# Patient Record
Sex: Male | Born: 1950
Health system: Southern US, Community
[De-identification: ages and names within clinical notes are randomized; demographics above are authoritative.]

## PROBLEM LIST (undated history)

## (undated) DIAGNOSIS — E119 Type 2 diabetes mellitus without complications: Secondary | ICD-10-CM

## (undated) DIAGNOSIS — I10 Essential (primary) hypertension: Secondary | ICD-10-CM

## (undated) DIAGNOSIS — H269 Unspecified cataract: Secondary | ICD-10-CM

## (undated) DIAGNOSIS — E11319 Type 2 diabetes mellitus with unspecified diabetic retinopathy without macular edema: Secondary | ICD-10-CM

## (undated) DIAGNOSIS — H35039 Hypertensive retinopathy, unspecified eye: Secondary | ICD-10-CM

## (undated) DIAGNOSIS — I6381 Other cerebral infarction due to occlusion or stenosis of small artery: Secondary | ICD-10-CM

## (undated) HISTORY — PX: FINGER SURGERY: SHX640

## (undated) HISTORY — PX: BACK SURGERY: SHX140

## (undated) HISTORY — DX: Hypertensive retinopathy, unspecified eye: H35.039

## (undated) HISTORY — DX: Type 2 diabetes mellitus with unspecified diabetic retinopathy without macular edema: E11.319

## (undated) HISTORY — DX: Unspecified cataract: H26.9

## (undated) NOTE — *Deleted (*Deleted)
[facility] - Clinic Note  06/30/2020     CHIEF COMPLAINT Patient presents for No chief complaint on file.   HISTORY OF PRESENT ILLNESS: Charles Fox is a 37 y.o. male who presents to the clinic today for:   pt is here for PRP OD  Referring physician: Sandre Kitty, MD [address] [place],  [place] [address]  HISTORICAL INFORMATION:   Selected notes from the MEDICAL RECORD NUMBER Referred by Dr. Laruth Bouchard   CURRENT MEDICATIONS: Current Outpatient Medications (Ophthalmic Drugs)  Medication Sig  . prednisoLONE acetate (PRED FORTE) 1 % ophthalmic suspension Place 1 drop into the right eye 4 (four) times daily for 7 days.   No current facility-administered medications for this visit. (Ophthalmic Drugs)   Current Outpatient Medications (Other)  Medication Sig  . Alcohol Swabs PADS He is to check blood glucose 2-3 times a day  . aspirin 81 MG tablet Take 1 tablet (81 mg total) by mouth daily. (Patient not taking: Reported on 06/11/2020)  . atorvastatin (LIPITOR) 40 MG tablet Take 1 tablet (40 mg total) by mouth daily. (Patient not taking: Reported on 06/11/2020)  . blood glucose meter kit and supplies KIT Dispense based on patient and insurance preference. Use up to four times daily as directed. (FOR ICD-9 250.00, 250.01). (Patient not taking: Reported on 03/04/2020)  . Blood Glucose Monitoring Suppl (ONETOUCH VERIO) w/Device KIT by Does not apply route.  [name] glucose blood (ONETOUCH VERIO) test strip Please use to check blood glucose once in the AM while fasting prior to giving insulin.  [name] insulin degludec (TRESIBA FLEXTOUCH) 100 UNIT/ML FlexTouch Pen Inject 30 Units into the skin daily.  . Insulin Syringe-Needle U-100 (INSULIN SYRINGE .5CC/30GX1/2") 30G X 1/2" 0.5 ML MISC Use with glargine. Dispose after each use.  . losartan (COZAAR) 50 MG tablet Take 1 tablet (50 mg total) by mouth at bedtime. (Patient not taking: Reported on 06/11/2020)  . metFORMIN  (GLUCOPHAGE-XR) 500 MG 24 hr tablet Take 1 tablet (500 mg total) by mouth 2 (two) times daily with a meal.  . OneTouch Delica Lancets 33G MISC Please use to check blood glucose once in the AM while fasting prior to giving insulin.   No current facility-administered medications for this visit. (Other)      REVIEW OF SYSTEMS:    ALLERGIES No Known Allergies  PAST MEDICAL HISTORY Past Medical History:  Diagnosis Date  . Diabetes mellitus without complication (HCC)   . Hypertension    Past Surgical History:  Procedure Laterality Date  . BACK SURGERY    . FINGER SURGERY      FAMILY HISTORY No family history on file.  SOCIAL HISTORY Social History   Tobacco Use  . Smoking status: Current Every Day Smoker    Packs/day: 0.50    Types: Cigarettes  . Smokeless tobacco: Never Used  Vaping Use  . Vaping Use: Never used  Substance Use Topics  . Alcohol use: No  . Drug use: Not Currently         OPHTHALMIC EXAM:  Not recorded     IMAGING AND PROCEDURES  Imaging and Procedures for 06/30/2020           ASSESSMENT/PLAN:  No diagnosis found.  1,2. Proliferative diabetic retinopathy w/ DME, OU (OS > OD) - exam shows +NVI OU, VH OU, +NVD OU, +NVE OU, scattered MA/IRH OU - FA (09.10.21) shows late-leaking MA, vascular nonperfusion, +NV -- will need PRP OU  - s/p  IVA OU #1 (09.10.21) - OCT with diabetic macular edema, both eyes -- slightly improved OU - PRP OD 10.06.21 - f/u Monday or Tuesday next week for DFE/OCT possible injxns  3,4. Hypertensive retinopathy OU - discussed importance of tight BP control - monitor  5. Mixed Cataract OU - The symptoms of cataract, surgical options, and treatments and risks were discussed with patient. - discussed diagnosis and progression - not yet visually significant - monitor for now   Ophthalmic Meds Ordered this visit:  No orders of the defined types were placed in this encounter.      No follow-ups on file.   There are no Patient Instructions on file for this visit.   Explained the diagnoses, plan, and follow up with the patient and they expressed understanding.  Patient expressed understanding of the importance of proper follow up care.   This document serves as a record of services personally performed by Karie Chimera, MD, PhD. It was created on their behalf by Cristopher Estimable, COT an ophthalmic technician. The creation of this record is the provider's dictation and/or activities during the visit.    Electronically signed by: Cristopher Estimable, COT 10.8.21 @ 9:27 AM  Abbreviations: M myopia (nearsighted); A astigmatism; H hyperopia (farsighted); P presbyopia; Mrx spectacle prescription;  CTL contact lenses; OD right eye; OS left eye; OU both eyes  XT exotropia; ET esotropia; PEK punctate epithelial keratitis; PEE punctate epithelial erosions; DES dry eye syndrome; MGD meibomian gland dysfunction; ATs artificial tears; PFAT's preservative free artificial tears; NSC nuclear sclerotic cataract; PSC posterior subcapsular cataract; ERM epi-retinal membrane; PVD posterior vitreous detachment; RD retinal detachment; DM diabetes mellitus; DR diabetic retinopathy; NPDR non-proliferative diabetic retinopathy; PDR proliferative diabetic retinopathy; CSME clinically significant macular edema; DME diabetic macular edema; dbh dot blot hemorrhages; CWS cotton wool spot; POAG primary open angle glaucoma; C/D cup-to-disc ratio; HVF humphrey visual field; GVF goldmann visual field; OCT optical coherence tomography; IOP intraocular pressure; BRVO Branch retinal vein occlusion; CRVO central retinal vein occlusion; CRAO central retinal artery occlusion; BRAO branch retinal artery occlusion; RT retinal tear; SB scleral buckle; PPV pars plana vitrectomy; VH Vitreous hemorrhage; PRP panretinal laser photocoagulation; IVK intravitreal kenalog; VMT vitreomacular traction; MH Macular hole;  NVD neovascularization of the disc; NVE  neovascularization elsewhere; AREDS age related eye disease study; ARMD age related macular degeneration; POAG primary open angle glaucoma; EBMD epithelial/anterior basement membrane dystrophy; ACIOL anterior chamber intraocular lens; IOL intraocular lens; PCIOL posterior chamber intraocular lens; Phaco/IOL phacoemulsification with intraocular lens placement; PRK photorefractive keratectomy; LASIK laser assisted in situ keratomileusis; HTN hypertension; DM diabetes mellitus; COPD chronic obstructive pulmonary disease

## (undated) NOTE — *Deleted (*Deleted)
[facility] - Clinic Note  07/20/2020     CHIEF COMPLAINT Patient presents for No chief complaint on file.   HISTORY OF PRESENT ILLNESS: Charles Fox is a 27 y.o. male who presents to the clinic today for:   pt states he had no problems after the laser procedure at last visit, no change in vision, he is still seeing floaters  Referring physician: Sandre Kitty, MD [address] [place],  [place] [address]  HISTORICAL INFORMATION:   Selected notes from the MEDICAL RECORD NUMBER Referred by Dr. Laruth Bouchard LEE:  Ocular Hx- PMH-    CURRENT MEDICATIONS: Current Outpatient Medications (Ophthalmic Drugs)  Medication Sig  . dorzolamide-timolol (COSOPT) 22.3-6.8 MG/ML ophthalmic solution Place 1 drop into both eyes 4 (four) times daily.   No current facility-administered medications for this visit. (Ophthalmic Drugs)   Current Outpatient Medications (Other)  Medication Sig  . Accu-Chek Softclix Lancets lancets Use as instructed  . Alcohol Swabs PADS He is to check blood glucose 2-3 times a day  . aspirin 81 MG tablet Take 1 tablet (81 mg total) by mouth daily.  [name] atorvastatin (LIPITOR) 40 MG tablet Take 1 tablet (40 mg total) by mouth daily.  . blood glucose meter kit and supplies KIT Dispense based on patient and insurance preference. Use up to four times daily as directed. (FOR ICD-9 250.00, 250.01).  . Blood Glucose Monitoring Suppl (ACCU-CHEK AVIVA PLUS) w/Device KIT Check blood glucose before in AM while fasting.  [name] glucose blood (ACCU-CHEK AVIVA PLUS) test strip Use as instructed  . insulin degludec (TRESIBA FLEXTOUCH) 100 UNIT/ML FlexTouch Pen Inject 30 Units into the skin daily before breakfast.  . Insulin Glargine (BASAGLAR KWIKPEN) 100 UNIT/ML   . Insulin Syringe-Needle U-100 (INSULIN SYRINGE .5CC/30GX1/2") 30G X 1/2" 0.5 ML MISC Use with glargine. Dispose after each use.  . losartan (COZAAR) 50 MG tablet Take 1 tablet (50 mg total) by mouth at  bedtime. (Patient not taking: Reported on 06/11/2020)  . metFORMIN (GLUCOPHAGE-XR) 500 MG 24 hr tablet Take 1 tablet (500 mg total) by mouth 2 (two) times daily with a meal.   No current facility-administered medications for this visit. (Other)      REVIEW OF SYSTEMS:    ALLERGIES No Known Allergies  PAST MEDICAL HISTORY Past Medical History:  Diagnosis Date  . Cataract    Mixed form OU  . Diabetes mellitus without complication (HCC)   . Diabetic retinopathy (HCC)    PDR OU  . Hypertension   . Hypertensive retinopathy    OU   Past Surgical History:  Procedure Laterality Date  . BACK SURGERY    . FINGER SURGERY      FAMILY HISTORY No family history on file.  SOCIAL HISTORY Social History   Tobacco Use  . Smoking status: Current Every Day Smoker    Packs/day: 0.50    Types: Cigarettes  . Smokeless tobacco: Never Used  Vaping Use  . Vaping Use: Never used  Substance Use Topics  . Alcohol use: No  . Drug use: Not Currently         OPHTHALMIC EXAM:  Not recorded     IMAGING AND PROCEDURES  Imaging and Procedures for 07/20/2020           ASSESSMENT/PLAN:    ICD-10-CM   1. Proliferative diabetic retinopathy of both eyes with macular edema associated with type 2 diabetes mellitus (HCC)  U04.5409   2. Combined forms of age-related  cataract of both eyes  H25.813   3. Retinal edema  H35.81   4. Essential hypertension  I10   5. Bilateral ocular hypertension  H40.053   6. Hypertensive retinopathy of both eyes  H35.033     1,2. Proliferative diabetic retinopathy w/ DME, OU (OS > OD) - initial exam showed +NVI OU, VH OU, +NVD OU, +NVE OU, scattered MA/IRH OU - FA (09.10.21) shows late-leaking MA, vascular nonperfusion, +NV -- will need PRP OU  - s/p IVA OU #1 (09.10.21)  - s/p PRP OD (10.06.21) - OCT with diabetic macular edema, both eyes -- persistent OU - recommend IVA OU #2 today, 10.14.21 - pt wishes to proceed with injections - RBA of  procedure discussed, questions answered - Avastin informed consent obtained, signed and scanned, 09.10.21 - see procedure note - f/u November 1, DFE, OCT, PRP OS  3,4. Hypertensive retinopathy OU - discussed importance of tight BP control - monitor  5. Mixed Cataract OU - The symptoms of cataract, surgical options, and treatments and risks were discussed with patient. - discussed diagnosis and progression - not yet visually significant - monitor for now  6. Ocular Hypertension  - IOP today: 23,25  - start Cosopt BID OU   Ophthalmic Meds Ordered this visit:  No orders of the defined types were placed in this encounter.      No follow-ups on file.  There are no Patient Instructions on file for this visit.  This document serves as a record of services personally performed by Karie Chimera, MD, PhD. It was created on their behalf by Herby Abraham, COA, an ophthalmic technician. The creation of this record is the provider's dictation and/or activities during the visit.    Electronically signed by: Herby Abraham, COA @TODAY @ 9:36 AM  Abbreviations: M myopia (nearsighted); A astigmatism; H hyperopia (farsighted); P presbyopia; Mrx spectacle prescription;  CTL contact lenses; OD right eye; OS left eye; OU both eyes  XT exotropia; ET esotropia; PEK punctate epithelial keratitis; PEE punctate epithelial erosions; DES dry eye syndrome; MGD meibomian gland dysfunction; ATs artificial tears; PFAT's preservative free artificial tears; NSC nuclear sclerotic cataract; PSC posterior subcapsular cataract; ERM epi-retinal membrane; PVD posterior vitreous detachment; RD retinal detachment; DM diabetes mellitus; DR diabetic retinopathy; NPDR non-proliferative diabetic retinopathy; PDR proliferative diabetic retinopathy; CSME clinically significant macular edema; DME diabetic macular edema; dbh dot blot hemorrhages; CWS cotton wool spot; POAG primary open angle glaucoma; C/D cup-to-disc ratio; HVF  humphrey visual field; GVF goldmann visual field; OCT optical coherence tomography; IOP intraocular pressure; BRVO Branch retinal vein occlusion; CRVO central retinal vein occlusion; CRAO central retinal artery occlusion; BRAO branch retinal artery occlusion; RT retinal tear; SB scleral buckle; PPV pars plana vitrectomy; VH Vitreous hemorrhage; PRP panretinal laser photocoagulation; IVK intravitreal kenalog; VMT vitreomacular traction; MH Macular hole;  NVD neovascularization of the disc; NVE neovascularization elsewhere; AREDS age related eye disease study; ARMD age related macular degeneration; POAG primary open angle glaucoma; EBMD epithelial/anterior basement membrane dystrophy; ACIOL anterior chamber intraocular lens; IOL intraocular lens; PCIOL posterior chamber intraocular lens; Phaco/IOL phacoemulsification with intraocular lens placement; PRK photorefractive keratectomy; LASIK laser assisted in situ keratomileusis; HTN hypertension; DM diabetes mellitus; COPD chronic obstructive pulmonary disease

---

## 1999-02-10 ENCOUNTER — Encounter: Admission: RE | Admit: 1999-02-10 | Discharge: 1999-02-10 | Payer: Self-pay | Admitting: *Deleted

## 2006-12-03 ENCOUNTER — Emergency Department (HOSPITAL_COMMUNITY): Admission: EM | Admit: 2006-12-03 | Discharge: 2006-12-03 | Payer: Self-pay | Admitting: Emergency Medicine

## 2009-05-26 ENCOUNTER — Emergency Department (HOSPITAL_COMMUNITY): Admission: EM | Admit: 2009-05-26 | Discharge: 2009-05-26 | Payer: Self-pay | Admitting: Emergency Medicine

## 2009-05-31 ENCOUNTER — Emergency Department (HOSPITAL_COMMUNITY): Admission: EM | Admit: 2009-05-31 | Discharge: 2009-05-31 | Payer: Self-pay | Admitting: Emergency Medicine

## 2009-07-03 ENCOUNTER — Encounter: Admission: RE | Admit: 2009-07-03 | Discharge: 2009-07-03 | Payer: Self-pay | Admitting: *Deleted

## 2009-09-21 ENCOUNTER — Ambulatory Visit (HOSPITAL_COMMUNITY): Admission: RE | Admit: 2009-09-21 | Discharge: 2009-09-21 | Payer: Self-pay | Admitting: *Deleted

## 2009-10-14 ENCOUNTER — Inpatient Hospital Stay (HOSPITAL_COMMUNITY): Admission: RE | Admit: 2009-10-14 | Discharge: 2009-10-17 | Payer: Self-pay | Admitting: *Deleted

## 2010-08-14 ENCOUNTER — Emergency Department (HOSPITAL_COMMUNITY): Admission: EM | Admit: 2010-08-14 | Discharge: 2010-08-14 | Payer: Self-pay | Admitting: Emergency Medicine

## 2010-09-01 ENCOUNTER — Emergency Department (HOSPITAL_COMMUNITY)
Admission: EM | Admit: 2010-09-01 | Discharge: 2010-09-01 | Payer: Self-pay | Source: Home / Self Care | Admitting: Emergency Medicine

## 2010-09-10 ENCOUNTER — Inpatient Hospital Stay (HOSPITAL_COMMUNITY)
Admission: EM | Admit: 2010-09-10 | Discharge: 2010-09-16 | Payer: Self-pay | Source: Home / Self Care | Attending: Orthopedic Surgery | Admitting: Orthopedic Surgery

## 2010-09-14 ENCOUNTER — Encounter (INDEPENDENT_AMBULATORY_CARE_PROVIDER_SITE_OTHER): Payer: Self-pay | Admitting: Orthopedic Surgery

## 2010-10-14 NOTE — Op Note (Addendum)
  Charles Fox, [name]               ACCOUNT NO.:  [id number]  MEDICAL RECORD NO.:  [id number]          PATIENT TYPE:  INP  LOCATION:  1603                         FACILITY:  [facility]  PHYSICIAN:  Artist Pais. Weingold, M.D.DATE OF BIRTH:  [birth year]  DATE OF PROCEDURE:  09/14/2010 DATE OF DISCHARGE:                              OPERATIVE REPORT   PREOPERATIVE DIAGNOSIS:  Severe right long finger infection.  POSTOPERATIVE DIAGNOSIS:  Severe right long finger infection.  PROCEDURE:  Repeat I and D with proximal phalangeal level amputation.  SURGEON:  Artist Pais. Mina Marble, M.D.  ASSISTANT:  None.  ANESTHESIA:  General.  TOURNIQUET TIME:  36 minutes.  No complication.  No drains.  One specimen sent.  The patient was taken to the operating suite.  After induction of adequate general anesthesia, right upper extremity was prepped and draped in sterile fashion.  An Esmarch was used to exsanguinate the limb, tourniquet was inflated to 250 mmHg.  At this point in time, the previously placed sutures and packing were removed.  There was significant soft tissue destruction along the entire dorsum of the hand to the level of the proximal phalanx to the tip.  The tip was filled with devitalized and purulent type material both dorsally and volarly. On the volar side, the skin was viable to the level of the P1 as well. Three liters of normal saline was used to lavage the wound out thoroughly.  At this point in time, the DIP joint was disarticulated, skin was dissected back until there was a viable edge.  After a thorough skin debridement, there was not enough tissue to cover the middle phalanx, this was also removed.  At this point in time, a volar radially based flap of skin was then used to close the rest of the incision loosely with 4-0 nylon and a combination of horizontal and simple interrupted sutures.  Bilateral neurectomies were performed.  The tissue all distally was nonviable, grossly  infected, and chronically inflamed. It was sent for pathologic confirmation.  Skin closure was loose and even the skin edges at this point in time were still of questionable viability; however, an attempt was made to preserve the length of the proximal phalanx.  The wound was then dressed with 4 x 4's, fluffs, and a compression wrap.  The patient tolerated the procedure well and was taken to recovery room in stable fashion.     Artist Pais Mina Marble, M.D.     MAW/MEDQ  D:  09/14/2010  T:  09/14/2010  Job:  [id number]  Electronically Signed by Dairl Ponder M.D. on 10/14/2010 07:47:18 PM

## 2010-10-14 NOTE — Op Note (Addendum)
  NAMECRAYTON, SAVARESE               ACCOUNT NO.:  [id number]  MEDICAL RECORD NO.:  [id number]          PATIENT TYPE:  INP  LOCATION:  1603                         FACILITY:  [facility]  PHYSICIAN:  Artist Pais. Weingold, M.D.DATE OF BIRTH:  06-09-1951  DATE OF PROCEDURE:  09/10/2010 DATE OF DISCHARGE:                              OPERATIVE REPORT   PREOPERATIVE DIAGNOSIS:  Severe infection right long finger.  POSTOPERATIVE DIAGNOSIS:  Severe infection right long finger.  PROCEDURE: 1. Incision and drainage with the distal interphalangeal joint     incision and drainage. 2. Flexor sheath incision and drainage. 3. Extensor sheath incision and drainage.  SURGEON:  Artist Pais. Mina Marble, M.D.  ASSISTANT:  None.  ANESTHESIA:  General.  TOURNIQUET TIME:  38 minutes.  COMPLICATION:  None.  DRAINS:  None.  Cultures x2 sent as well as stat Gram stain.  The patient was taken to operating suite after induction of adequate general anesthesia.  Right upper extremity was prepped and draped in sterile fashion.  The arm was elevated 5 minutes, tourniquet inflated to 150 mmHg.  At this point in time, incision was made midline of the dorsal aspect of his long finger, starting from the MP joint and going to the DIP joint.  This was encountered immediately with foul-smelling. It was cultured for aerobic, anaerobic, and stat Gram stain.  This dissection was carried down to the level extensor mechanism.  Dissection was carried down on either side.  Purulence was encountered.  It was irrigated with 3 liters normal saline using pulse lavage.  He was then supinated.  Incision was made from A1 pulley up to the level of the DIP joint with an obvious infected DIP joint.  There is a skin bridge with a septic DIP joint easily accessible.  The flexor sheath and flexor tendon were identified.  Neurovascularly bones were retracted and symptomatic purulence was debrided using 3 liters normal saline  under pulse lavage.  Both the dorsal and volar wounds were then packed opened from gauze.  The wounds were closed with 4-0 nylon.  The patient was placed in sterile dressing, 4x4s fluffs, and compression bandage.  The patient tolerated the procedure well.     Artist Pais Mina Marble, M.D.     MAW/MEDQ  D:  09/10/2010  T:  09/10/2010  Job:  [id number]  Electronically Signed by Dairl Ponder M.D. on 10/14/2010 07:47:04 PM

## 2010-10-14 NOTE — Discharge Summary (Addendum)
  [name], [name]               ACCOUNT NO.:  [id number]  MEDICAL RECORD NO.:  [id number]          PATIENT TYPE:  INP  LOCATION:  1603                         FACILITY:  [facility]  PHYSICIAN:  Artist Pais. Shelbi Vaccaro, M.D.DATE OF BIRTH:  08-Oct-1950  DATE OF ADMISSION:  09/10/2010 DATE OF DISCHARGE:  09/16/2010                              DISCHARGE SUMMARY   PRINCIPAL DIAGNOSIS:  Severe infection, right long finger.  SECONDARY DIAGNOSES: 1. Insulin-dependent diabetes mellitus. 2. Hypertension. 3. Hyperlipidemia. 4. Lumbar spine disease.  DISCHARGE DIAGNOSIS:  Resolving infection, right long finger.  CONSULTANTS:  [facility] for diabetic care.  Infectious Disease service.  BODY:  [name] is a 60 year old male who presented to the emergency room on the 23rd with a severely infected right long finger.  He was evaluated by the [facility] as well as the Hand Surgery service. He was taken to the operating room that night where he underwent an I and D with culturing.  His cultures showed multi microbial infection including gram-positive cocci, gram-negative rods, and gram-positive rods.  He was started on IV antibiotics as per the ID service and was taken back to the operating room on the 25th, a repeat I and D.  Gram stain at that point in time showed no cultures, but significant soft tissue destruction.  His wounds were repacked and open, and he was taken back to the operating room on a third time on the 27th.  At that point in time, a PIP level amputation was undertaken to the severe soft tissue destruction.  After that was completed, his cultures eventually grew out two organisms which were sensitive to oral agents, one of them including methicillin-resistant staph.  The ID service then switched him over to p.o. antibiotics including Flagyl t.i.d. 500 mg and doxycycline 100 mg b.i.d.  He was then discharged from the hospital on the 29th with 2 weeks' worth of p.o.  Flagyl and doxycycline, Percocet for pain.  He will follow up with Dr. Ronie Spies office, my office, this Tuesday for a dressing change.  I explained to him in great detail the nature of his significant infection and the fact we may need to provide further surgery if his wounds do not heal.  He is discharged with, again, pain medication, antibiotics x2.  DISCHARGE DIAGNOSIS:  Severe infection, right long finger.     Artist Pais Mina Marble, M.D.    MAW/MEDQ  D:  09/16/2010  T:  09/16/2010  Job:  [id number]  Electronically Signed by Dairl Ponder M.D. on 10/14/2010 07:47:01 PM

## 2010-10-14 NOTE — Consult Note (Addendum)
  NAMEREYNALD, Charles Fox               ACCOUNT NO.:  [id number]  MEDICAL RECORD NO.:  [id number]          PATIENT TYPE:  INP  LOCATION:  1603                         FACILITY:  [facility]  PHYSICIAN:  Artist Pais. Weingold, M.D.DATE OF BIRTH:  [birth year]  DATE OF CONSULTATION:  09/10/2010 DATE OF DISCHARGE:                                CONSULTATION   REFERRING PHYSICIAN:  Celene Kras, MD  REASON FOR CONSULTATION:  Dre Gamino is a 60 year old right-hand- dominant male who is an insulin-dependent diabetic with hypercholesteremia and hypertension, who presents with a 2-week history of worsening pain and swelling in the right long finger, now with an acute dactylitis with evidence of infection in both the dorsal and volar aspects of his dominant right long finger.  He denies any penetrating trauma to the finger, but over the last 2 weeks, has noticed more pain and swelling, presents today with an acutely infected right long finger.  ALLERGIES:  No known drug allergies.  MEDICATIONS:  He is currently taking Humulin, Pravachol, and lisinopril.  PAST MEDICAL HISTORY:  Significant for diabetes, hypercholesterolemia, and hypertension.  No recent hospitalizations or surgeries.  He does not drink.  He occasionally uses tobacco products.  PHYSICAL EXAMINATION:  GENERAL:  Today, well-nourished male, pleasant, alert and oriented x3. EXTREMITIES:  Examination of his hand, he has an obviously swollen and tender long finger with Kanavel signs and also pain over the extensor side.  He has serous-type drainage dorsally with evidence of a deep infection, both on the dorsal and volar aspects.  X-rays show soft tissue shattering only.  His white count is 18,000.  IMPRESSION:  This is a 60 year old male with a chronically infected right long finger.  I have discussed with him at this point in time, I would recommend I and D in the operating room.  I also let him know that because of the severity of  the infection, I suspect he might end up with an amputation at one point in the future.  We will certainly do an I and D, culture, start him on IV antibiotics, consult ID and take him back to the operating room in 48 hours for another inspection and washout with possible removal of devitalized tissue at that point in time.    Artist Pais Mina Marble, M.D.    MAW/MEDQ  D:  09/10/2010  T:  09/10/2010  Job:  [id number]  Electronically Signed by Dairl Ponder M.D. on 10/14/2010 07:47:10 PM

## 2010-10-14 NOTE — Op Note (Addendum)
  [name], [name]               ACCOUNT NO.:  [id number]  MEDICAL RECORD NO.:  [id number]          PATIENT TYPE:  INP  LOCATION:  1603                         FACILITY:  [facility]  PHYSICIAN:  Artist Pais. Rodrickus Min, M.D.DATE OF BIRTH:  February 17, 1951  DATE OF PROCEDURE:  09/12/2010 DATE OF DISCHARGE:                              OPERATIVE REPORT   PREOPERATIVE DIAGNOSIS:  Severe infection, right long finger.  POSTOPERATIVE DIAGNOSIS:  Severe infection, right long finger.  PROCEDURE:  Repeat incision and drainage with cultures and debridement.  SURGEON:  Artist Pais. Mina Marble, MD  ASSISTANT:  None.  ANESTHESIA:  General.  TOURNIQUET TIME:  26 minutes.  COMPLICATIONS:  None.  DRAINS:  None.  DESCRIPTION OF PROCEDURE:  The patient was taken to operating suite after induction of adequate general anesthesia.  The right upper extremity was prepped and draped in sterile fashion.  Prior to prepping and draping, the previously placed dorsal and volar Iodoform was removed.  Once this was done, arm was elevated and tourniquet inflated to 215 mmHg.  At this point in time, visualization revealed reaccumulation of purulence, particularly volarly distally over the DIP joint.  The rest of the wound had a fair amount of cloudy fluid along the extensor and flexor sheaths.  The dorsal and volar sides were thoroughly irrigated with pulse lavage, 3 L on each for a total of 6 L. All devitalized tissue was debrided and it was packed open again with Iodoform gauze, both dorsally and volarly and 4x4's fluffs and a compression bandage was applied.  The patient tolerated the procedure well.  The patient also had intraoperative cultures and stat Gram stain taken.     Artist Pais Mina Marble, M.D.     MAW/MEDQ  D:  09/12/2010  T:  09/12/2010  Job:  [id number]  Electronically Signed by Dairl Ponder M.D. on 10/14/2010 07:47:14 PM

## 2010-10-23 NOTE — Consult Note (Signed)
Charles Fox, Charles Fox               ACCOUNT NO.:  [id number]  MEDICAL RECORD NO.:  [id number]          PATIENT TYPE:  INP  LOCATION:  1603                         FACILITY:  [facility]  PHYSICIAN:  Acey Lav, MD  DATE OF BIRTH:  Sep 18, 1951  DATE OF CONSULTATION: DATE OF DISCHARGE:                                CONSULTATION   REASON FOR INFECTIOUS DISEASE CONSULTATION:  Severe hand infection.  HISTORY OF PRESENT ILLNESS:  Charles Fox is a 60 year old African- American gentleman with a past medical history significant for hypertension, diabetes mellitus, hyperlipidemia, prior lumbar surgery who sustained an injury to his hand approximately 2 to 3 weeks ago when he struck it against the lever on his recliner.  He thought that he had broken his finger and came to the emergency room where he had plain films performed which showed some mild soft tissue swelling, but no evidence of fracture or dislocation.  He had a cast placed and then returned to home.  When he came back and had the cast removed he had severe swelling of his right long finger.  He was seen in the emergency room yesterday on December 23 and given Unasyn prior to his being taken to the operating room emergently by Hand Surgery.  They performed surgery last night with incision and drainage of the distal interphalangeal joint and flexor sheath incision and drainage and extensor sheath incision and drainage.  The Gram stain does show gram- negative rods.  The culture from the abscess on antibiotics has not grown anything so far.  We are asked to assist in the workup of this patient with severe hand infection.  Of note, there was a rumor that the patient may have been scratched or bitten by a dog.  He denies this emphatically.  He does have a dog but his son tends to the dog.  He says that he does not go near the dog and adamantly denies having any contact, even potentially any licking of the finger by the dog.  PAST  MEDICAL HISTORY: 1. Hypertension. 2. Hyperlipidemia.  PAST SURGICAL HISTORY:  As described above for hand infection.  Also had a surgery on October 14, 2009, by Dr. Yevette Edwards.  This was a transforaminal lumbar interbody fusion of L4-L5 and posterior spinal segmental instrumentation, posterior spinal fusion, posterolateral L4-L5 fusion with use of autograft.  FAMILY HISTORY:  Noncontributory.  SOCIAL HISTORY:  The patient is married, has one child, does not drink, does not smoke, is a retired Midwife.  ALLERGIES:  NO KNOWN DRUG ALLERGIES.  MEDICATIONS:  In the hospital insulin, Zosyn, vancomycin, Zocor, Benadryl, Dilaudid, oxycodone, Ambien.  REVIEW OF SYSTEMS:  Described in history of present illness.  Otherwise a 12-point review of systems was negative.  PHYSICAL EXAMINATION:  VITAL SIGNS:  Temperature maximum since admission 98.7, temperature current 98.4.  Blood pressure 137/99.  Pulse 100. Respirations 20.  Pulse oximetry 94% on 3 liters via nasal cannula. GENERAL:  A quite pleasant gentleman in no acute stress. HEENT:  Normocephalic, atraumatic.  Pupils are equal, round, and reactive to light.  Sclerae icteric.  Oropharynx clear. CARDIOVASCULAR:  Regular  rate and rhythm.  No murmurs, gallops, or rubs. LUNGS:  Clear to auscultation bilaterally. ABDOMEN:  Soft, nondistended. EXTREMITIES:  Right upper extremity is in a sling.  LABORATORY DATA:  Plain films of the hand done yesterday showed diffuse soft tissue swelling of the middle finger without evidence of soft tissue gas or radiopaque foreign body.  CBC with differential today:  White count 15.5, hemoglobin 12.3, platelets 329.  Metabolic panel on admission showed normal sodium slightly at 133, potassium 4, chloride 100, bicarbonate 23, BUN and creatinine 11 and 0.75, glucose 295.  Further microbiological data:  Gram stain done on December 23 showed abundant white blood cells, predominantly PMN with moderate  gram- negative rods.  Microbiological data:  Culture abscess, no growth to date from right middle finger.  Urine culture November 26, no growth.  IMPRESSION AND RECOMMENDATIONS:  This is a 60 year old African-American gentleman with a severe hand infection involving distal interphalangeal joint and flexor and extensor sheath status post incision and drainage with Gram stain showing gram-negative rods on broad-spectrum antibiotics including vancomycin and Zosyn. 1. Severe hand infection:  I agree with the current antibiotic     coverage which will cover well for gram-negative pathogens as well     as gram-positive organisms.  It would be unusual to find a     Pseudomonal species here though he is diabetic and that does raise     the chance that he does have gram-negative rods seen on Gram stain.     We will follow up the cultures and craft an antibiotic regimen     based on the results of the culture data.  I really had wished that     he had not had antibiotics prior to surgery.  There was no need to     give that before surgery and it has only decreased our ability to     identify what the pathogen was in this case.  In any case, we will     continue antibiotics as described above.  I will place a     peripherally inserted central catheter line as the patient is     anxious to leave the hospital.  I do not think there is a realistic     expectation at this point in time but at least I will put a     peripherally inserted central catheter line in so that if he needs     to go home or wants to be discharged in the next day or two he will     have a peripherally inserted central catheter line in place.  I     have told him that I will need to be able to craft his antibiotics     around the culture data and that he may have further wound care and     other wound care needs per the primary team.  Certainly I would     expect him to be in the hospital a few more days but I will defer      to the primary team with regard to that issue.  I will check a     sedimentation rate and C-reactive protein. 2. Screening.  I will screen for human immunodeficiency virus.  Thank you for this Infectious Disease consultation.    Acey Lav, MD    CV/MEDQ  D:  09/11/2010  T:  09/11/2010  Job:  [id number]  Electronically Signed by Paulette Blanch DAM MD on  10/23/2010 09:42:23 AM

## 2010-11-29 LAB — ANAEROBIC CULTURE

## 2010-11-29 LAB — GLUCOSE, CAPILLARY
Glucose-Capillary: 108 mg/dL — ABNORMAL HIGH (ref 70–99)
Glucose-Capillary: 108 mg/dL — ABNORMAL HIGH (ref 70–99)
Glucose-Capillary: 135 mg/dL — ABNORMAL HIGH (ref 70–99)
Glucose-Capillary: 141 mg/dL — ABNORMAL HIGH (ref 70–99)
Glucose-Capillary: 146 mg/dL — ABNORMAL HIGH (ref 70–99)
Glucose-Capillary: 151 mg/dL — ABNORMAL HIGH (ref 70–99)
Glucose-Capillary: 162 mg/dL — ABNORMAL HIGH (ref 70–99)
Glucose-Capillary: 181 mg/dL — ABNORMAL HIGH (ref 70–99)
Glucose-Capillary: 181 mg/dL — ABNORMAL HIGH (ref 70–99)
Glucose-Capillary: 192 mg/dL — ABNORMAL HIGH (ref 70–99)
Glucose-Capillary: 197 mg/dL — ABNORMAL HIGH (ref 70–99)
Glucose-Capillary: 201 mg/dL — ABNORMAL HIGH (ref 70–99)
Glucose-Capillary: 218 mg/dL — ABNORMAL HIGH (ref 70–99)
Glucose-Capillary: 220 mg/dL — ABNORMAL HIGH (ref 70–99)
Glucose-Capillary: 226 mg/dL — ABNORMAL HIGH (ref 70–99)
Glucose-Capillary: 232 mg/dL — ABNORMAL HIGH (ref 70–99)
Glucose-Capillary: 246 mg/dL — ABNORMAL HIGH (ref 70–99)
Glucose-Capillary: 259 mg/dL — ABNORMAL HIGH (ref 70–99)
Glucose-Capillary: 283 mg/dL — ABNORMAL HIGH (ref 70–99)
Glucose-Capillary: 312 mg/dL — ABNORMAL HIGH (ref 70–99)

## 2010-11-29 LAB — DIFFERENTIAL
Basophils Relative: 0 % (ref 0–1)
Eosinophils Absolute: 0.5 10*3/uL (ref 0.0–0.7)
Lymphs Abs: 1.8 10*3/uL (ref 0.7–4.0)
Monocytes Relative: 9 % (ref 3–12)
Neutro Abs: 10.3 10*3/uL — ABNORMAL HIGH (ref 1.7–7.7)
Neutrophils Relative %: 74 % (ref 43–77)

## 2010-11-29 LAB — CBC
HCT: 34.7 % — ABNORMAL LOW (ref 39.0–52.0)
HCT: 35.2 % — ABNORMAL LOW (ref 39.0–52.0)
HCT: 35.3 % — ABNORMAL LOW (ref 39.0–52.0)
HCT: 36.6 % — ABNORMAL LOW (ref 39.0–52.0)
HCT: 40.9 % (ref 39.0–52.0)
Hemoglobin: 11.5 g/dL — ABNORMAL LOW (ref 13.0–17.0)
Hemoglobin: 11.6 g/dL — ABNORMAL LOW (ref 13.0–17.0)
Hemoglobin: 11.7 g/dL — ABNORMAL LOW (ref 13.0–17.0)
Hemoglobin: 12.3 g/dL — ABNORMAL LOW (ref 13.0–17.0)
Hemoglobin: 13.9 g/dL (ref 13.0–17.0)
MCH: 29.6 pg (ref 26.0–34.0)
MCH: 29.6 pg (ref 26.0–34.0)
MCH: 29.7 pg (ref 26.0–34.0)
MCH: 30 pg (ref 26.0–34.0)
MCHC: 33 g/dL (ref 30.0–36.0)
MCHC: 33.1 g/dL (ref 30.0–36.0)
MCHC: 33.1 g/dL (ref 30.0–36.0)
MCHC: 33.6 g/dL (ref 30.0–36.0)
MCHC: 34 g/dL (ref 30.0–36.0)
MCV: 88.3 fL (ref 78.0–100.0)
MCV: 88.4 fL (ref 78.0–100.0)
MCV: 89.4 fL (ref 78.0–100.0)
MCV: 89.8 fL (ref 78.0–100.0)
Platelets: 329 10*3/uL (ref 150–400)
Platelets: 340 10*3/uL (ref 150–400)
Platelets: 356 10*3/uL (ref 150–400)
Platelets: 405 10*3/uL — ABNORMAL HIGH (ref 150–400)
RBC: 3.92 MIL/uL — ABNORMAL LOW (ref 4.22–5.81)
RBC: 3.95 MIL/uL — ABNORMAL LOW (ref 4.22–5.81)
RBC: 4.14 MIL/uL — ABNORMAL LOW (ref 4.22–5.81)
RBC: 4.63 MIL/uL (ref 4.22–5.81)
RDW: 13 % (ref 11.5–15.5)
RDW: 13 % (ref 11.5–15.5)
RDW: 13.1 % (ref 11.5–15.5)
RDW: 13.1 % (ref 11.5–15.5)
RDW: 13.1 % (ref 11.5–15.5)
WBC: 11 10*3/uL — ABNORMAL HIGH (ref 4.0–10.5)
WBC: 12.3 10*3/uL — ABNORMAL HIGH (ref 4.0–10.5)
WBC: 13.8 10*3/uL — ABNORMAL HIGH (ref 4.0–10.5)
WBC: 15.5 10*3/uL — ABNORMAL HIGH (ref 4.0–10.5)
WBC: 18.6 10*3/uL — ABNORMAL HIGH (ref 4.0–10.5)

## 2010-11-29 LAB — GRAM STAIN: Gram Stain: NONE SEEN

## 2010-11-29 LAB — BASIC METABOLIC PANEL
BUN: 11 mg/dL (ref 6–23)
Calcium: 8.5 mg/dL (ref 8.4–10.5)
Calcium: 9 mg/dL (ref 8.4–10.5)
Creatinine, Ser: 0.75 mg/dL (ref 0.4–1.5)
GFR calc non Af Amer: >60 mL/min (ref >60)
GFR calc non Af Amer: >60 mL/min (ref >60)
Potassium: 3.9 mEq/L (ref 3.5–5.1)
Potassium: 4 mEq/L (ref 3.5–5.1)
Sodium: 134 mEq/L — ABNORMAL LOW (ref 135–145)

## 2010-11-29 LAB — HIV ANTIBODY (ROUTINE TESTING W REFLEX): HIV: NONREACTIVE

## 2010-11-29 LAB — SEDIMENTATION RATE: Sed Rate: 108 mm/hr — ABNORMAL HIGH (ref 0–16)

## 2010-11-29 LAB — WOUND CULTURE

## 2010-11-29 LAB — CULTURE, ROUTINE-ABSCESS

## 2010-11-29 LAB — HEMOGLOBIN A1C
Hgb A1c MFr Bld: 12.2 % — ABNORMAL HIGH (ref <5.7)
Mean Plasma Glucose: 303 mg/dL — ABNORMAL HIGH (ref <117)

## 2010-11-29 LAB — VANCOMYCIN, TROUGH
Vancomycin Tr: 11.4 ug/mL (ref 10.0–20.0)
Vancomycin Tr: 13.3 ug/mL (ref 10.0–20.0)

## 2010-11-29 LAB — HIGH SENSITIVITY CRP: CRP, High Sensitivity: 233.8 mg/L — ABNORMAL HIGH

## 2010-11-30 LAB — URINALYSIS, ROUTINE W REFLEX MICROSCOPIC
Nitrite: NEGATIVE
Specific Gravity, Urine: 1.037 — ABNORMAL HIGH (ref 1.005–1.030)
Urobilinogen, UA: 0.2 mg/dL (ref 0.0–1.0)
pH: 6.5 (ref 5.0–8.0)

## 2010-11-30 LAB — URINE MICROSCOPIC-ADD ON: Urine-Other: NONE SEEN

## 2010-11-30 LAB — URINE CULTURE

## 2010-11-30 LAB — GLUCOSE, CAPILLARY: Glucose-Capillary: 437 mg/dL — ABNORMAL HIGH (ref 70–99)

## 2010-12-06 LAB — PROTIME-INR
INR: 0.97 (ref 0.00–1.49)
Prothrombin Time: 12.8 seconds (ref 11.6–15.2)

## 2010-12-06 LAB — GLUCOSE, CAPILLARY
Glucose-Capillary: 166 mg/dL — ABNORMAL HIGH (ref 70–99)
Glucose-Capillary: 183 mg/dL — ABNORMAL HIGH (ref 70–99)
Glucose-Capillary: 205 mg/dL — ABNORMAL HIGH (ref 70–99)
Glucose-Capillary: 218 mg/dL — ABNORMAL HIGH (ref 70–99)
Glucose-Capillary: 258 mg/dL — ABNORMAL HIGH (ref 70–99)

## 2010-12-06 LAB — URINALYSIS, ROUTINE W REFLEX MICROSCOPIC
Hgb urine dipstick: NEGATIVE
Leukocytes, UA: NEGATIVE
Protein, ur: NEGATIVE mg/dL
Specific Gravity, Urine: 1.041 — ABNORMAL HIGH (ref 1.005–1.030)
Urobilinogen, UA: 1 mg/dL (ref 0.0–1.0)

## 2010-12-06 LAB — COMPREHENSIVE METABOLIC PANEL
ALT: 22 U/L (ref 0–53)
AST: 18 U/L (ref 0–37)
BUN: 10 mg/dL (ref 6–23)
CO2: 23 mEq/L (ref 19–32)
Calcium: 9.7 mg/dL (ref 8.4–10.5)
Chloride: 100 mEq/L (ref 96–112)
GFR calc non Af Amer: >60 mL/min (ref >60)
Glucose, Bld: 268 mg/dL — ABNORMAL HIGH (ref 70–99)
Potassium: 4.7 mEq/L (ref 3.5–5.1)
Total Protein: 7.3 g/dL (ref 6.0–8.3)

## 2010-12-06 LAB — POCT I-STAT 4, (NA,K, GLUC, HGB,HCT)
Glucose, Bld: 241 mg/dL — ABNORMAL HIGH (ref 70–99)
HCT: 36 % — ABNORMAL LOW (ref 39.0–52.0)
Hemoglobin: 12.2 g/dL — ABNORMAL LOW (ref 13.0–17.0)
Hemoglobin: 12.9 g/dL — ABNORMAL LOW (ref 13.0–17.0)
Sodium: 135 mEq/L (ref 135–145)

## 2010-12-06 LAB — CBC
HCT: 49.5 % (ref 39.0–52.0)
Hemoglobin: 13.3 g/dL (ref 13.0–17.0)
Hemoglobin: 16.6 g/dL (ref 13.0–17.0)
MCHC: 34.7 g/dL (ref 30.0–36.0)
RBC: 4.26 MIL/uL (ref 4.22–5.81)
RBC: 5.45 MIL/uL (ref 4.22–5.81)
WBC: 10.3 10*3/uL (ref 4.0–10.5)

## 2010-12-06 LAB — POCT I-STAT GLUCOSE
Glucose, Bld: 299 mg/dL — ABNORMAL HIGH (ref 70–99)
Operator id: 207831
Operator id: 230421

## 2010-12-06 LAB — DIFFERENTIAL
Eosinophils Absolute: 0.2 10*3/uL (ref 0.0–0.7)
Eosinophils Relative: 2 % (ref 0–5)
Lymphocytes Relative: 21 % (ref 12–46)
Lymphs Abs: 2.1 10*3/uL (ref 0.7–4.0)
Monocytes Relative: 6 % (ref 3–12)
Neutrophils Relative %: 71 % (ref 43–77)

## 2010-12-06 LAB — TYPE AND SCREEN: ABO/RH(D): O POS

## 2010-12-06 LAB — URINE MICROSCOPIC-ADD ON

## 2010-12-20 LAB — CBC
MCHC: 34.1 g/dL (ref 30.0–36.0)
MCV: 91.1 fL (ref 78.0–100.0)
Platelets: 274 10*3/uL (ref 150–400)
RDW: 13.4 % (ref 11.5–15.5)

## 2010-12-20 LAB — PROTIME-INR: Prothrombin Time: 12.7 seconds (ref 11.6–15.2)

## 2010-12-20 LAB — DIFFERENTIAL
Eosinophils Relative: 3 % (ref 0–5)
Lymphocytes Relative: 20 % (ref 12–46)
Lymphs Abs: 2.1 10*3/uL (ref 0.7–4.0)

## 2010-12-20 LAB — URINALYSIS, ROUTINE W REFLEX MICROSCOPIC
Glucose, UA: >1000 mg/dL — AB
Leukocytes, UA: NEGATIVE
Nitrite: NEGATIVE
Protein, ur: NEGATIVE mg/dL
Urobilinogen, UA: 1 mg/dL (ref 0.0–1.0)

## 2010-12-20 LAB — COMPREHENSIVE METABOLIC PANEL
AST: 18 U/L (ref 0–37)
Albumin: 4.4 g/dL (ref 3.5–5.2)
CO2: 24 mEq/L (ref 19–32)
Calcium: 9.7 mg/dL (ref 8.4–10.5)
Creatinine, Ser: 0.75 mg/dL (ref 0.4–1.5)
GFR calc Af Amer: >60 mL/min (ref >60)
GFR calc non Af Amer: >60 mL/min (ref >60)

## 2010-12-24 LAB — URINALYSIS, ROUTINE W REFLEX MICROSCOPIC
Bilirubin Urine: NEGATIVE
Glucose, UA: >1000 mg/dL — AB
Hgb urine dipstick: NEGATIVE

## 2010-12-24 LAB — GLUCOSE, CAPILLARY
Glucose-Capillary: 216 mg/dL — ABNORMAL HIGH (ref 70–99)
Glucose-Capillary: 232 mg/dL — ABNORMAL HIGH (ref 70–99)
Glucose-Capillary: 253 mg/dL — ABNORMAL HIGH (ref 70–99)

## 2011-05-07 ENCOUNTER — Emergency Department (HOSPITAL_COMMUNITY)
Admission: EM | Admit: 2011-05-07 | Discharge: 2011-05-08 | Disposition: A | Discharge status: Home / Self Care | Payer: Medicaid Other | Attending: Emergency Medicine | Admitting: Emergency Medicine

## 2011-05-07 DIAGNOSIS — I10 Essential (primary) hypertension: Secondary | ICD-10-CM | POA: Insufficient documentation

## 2011-05-07 DIAGNOSIS — L02818 Cutaneous abscess of other sites: Secondary | ICD-10-CM | POA: Insufficient documentation

## 2011-05-07 DIAGNOSIS — Z794 Long term (current) use of insulin: Secondary | ICD-10-CM | POA: Insufficient documentation

## 2011-05-07 DIAGNOSIS — E119 Type 2 diabetes mellitus without complications: Secondary | ICD-10-CM | POA: Insufficient documentation

## 2011-05-10 ENCOUNTER — Emergency Department (HOSPITAL_COMMUNITY)
Admission: EM | Admit: 2011-05-10 | Discharge: 2011-05-10 | Disposition: A | Discharge status: Home / Self Care | Payer: Medicaid Other | Attending: Emergency Medicine | Admitting: Emergency Medicine

## 2011-05-10 DIAGNOSIS — Z794 Long term (current) use of insulin: Secondary | ICD-10-CM | POA: Insufficient documentation

## 2011-05-10 DIAGNOSIS — L02818 Cutaneous abscess of other sites: Secondary | ICD-10-CM | POA: Insufficient documentation

## 2011-05-10 DIAGNOSIS — E119 Type 2 diabetes mellitus without complications: Secondary | ICD-10-CM | POA: Insufficient documentation

## 2011-05-10 DIAGNOSIS — I10 Essential (primary) hypertension: Secondary | ICD-10-CM | POA: Insufficient documentation

## 2011-05-13 ENCOUNTER — Emergency Department (HOSPITAL_COMMUNITY)
Admission: EM | Admit: 2011-05-13 | Discharge: 2011-05-13 | Disposition: A | Discharge status: Home / Self Care | Payer: Medicaid Other | Attending: Emergency Medicine | Admitting: Emergency Medicine

## 2011-05-13 DIAGNOSIS — Z09 Encounter for follow-up examination after completed treatment for conditions other than malignant neoplasm: Secondary | ICD-10-CM | POA: Insufficient documentation

## 2011-05-13 DIAGNOSIS — E119 Type 2 diabetes mellitus without complications: Secondary | ICD-10-CM | POA: Insufficient documentation

## 2011-05-13 DIAGNOSIS — L03818 Cellulitis of other sites: Secondary | ICD-10-CM | POA: Insufficient documentation

## 2011-05-13 DIAGNOSIS — L02818 Cutaneous abscess of other sites: Secondary | ICD-10-CM | POA: Insufficient documentation

## 2011-05-13 DIAGNOSIS — I10 Essential (primary) hypertension: Secondary | ICD-10-CM | POA: Insufficient documentation

## 2011-05-13 LAB — CULTURE, ROUTINE-ABSCESS

## 2011-05-19 ENCOUNTER — Emergency Department (HOSPITAL_COMMUNITY)
Admission: EM | Admit: 2011-05-19 | Discharge: 2011-05-19 | Disposition: A | Discharge status: Home / Self Care | Payer: Medicaid Other | Attending: Emergency Medicine | Admitting: Emergency Medicine

## 2011-05-19 DIAGNOSIS — E119 Type 2 diabetes mellitus without complications: Secondary | ICD-10-CM | POA: Insufficient documentation

## 2011-05-19 DIAGNOSIS — L03019 Cellulitis of unspecified finger: Secondary | ICD-10-CM | POA: Insufficient documentation

## 2011-05-19 DIAGNOSIS — Z794 Long term (current) use of insulin: Secondary | ICD-10-CM | POA: Insufficient documentation

## 2011-05-19 DIAGNOSIS — I10 Essential (primary) hypertension: Secondary | ICD-10-CM | POA: Insufficient documentation

## 2013-05-26 ENCOUNTER — Encounter (HOSPITAL_COMMUNITY): Payer: Self-pay | Admitting: Emergency Medicine

## 2013-05-26 ENCOUNTER — Emergency Department (HOSPITAL_COMMUNITY)
Admission: EM | Admit: 2013-05-26 | Discharge: 2013-05-26 | Disposition: A | Discharge status: Home / Self Care | Payer: Medicare HMO | Attending: Emergency Medicine | Admitting: Emergency Medicine

## 2013-05-26 DIAGNOSIS — F17200 Nicotine dependence, unspecified, uncomplicated: Secondary | ICD-10-CM | POA: Insufficient documentation

## 2013-05-26 DIAGNOSIS — Z794 Long term (current) use of insulin: Secondary | ICD-10-CM | POA: Insufficient documentation

## 2013-05-26 DIAGNOSIS — E119 Type 2 diabetes mellitus without complications: Secondary | ICD-10-CM | POA: Insufficient documentation

## 2013-05-26 DIAGNOSIS — L02818 Cutaneous abscess of other sites: Secondary | ICD-10-CM | POA: Insufficient documentation

## 2013-05-26 DIAGNOSIS — I10 Essential (primary) hypertension: Secondary | ICD-10-CM | POA: Insufficient documentation

## 2013-05-26 DIAGNOSIS — L02811 Cutaneous abscess of head [any part, except face]: Secondary | ICD-10-CM

## 2013-05-26 HISTORY — DX: Essential (primary) hypertension: I10

## 2013-05-26 HISTORY — DX: Type 2 diabetes mellitus without complications: E11.9

## 2013-05-26 MED ORDER — IBUPROFEN 800 MG PO TABS
800.0000 mg | ORAL_TABLET | Freq: Once | ORAL | Status: AC
  Administered 2013-05-26: 800 mg via ORAL
  Filled 2013-05-26: qty 1

## 2013-05-26 MED ORDER — HYDROCODONE-ACETAMINOPHEN 5-325 MG PO TABS: 2.0000 | ORAL_TABLET | ORAL | Status: DC | PRN

## 2013-05-26 MED ORDER — CLINDAMYCIN HCL 300 MG PO CAPS: 300.0000 mg | ORAL_CAPSULE | Freq: Three times a day (TID) | ORAL | Status: DC

## 2013-05-26 MED ORDER — HYDROCODONE-ACETAMINOPHEN 5-325 MG PO TABS
2.0000 | ORAL_TABLET | Freq: Once | ORAL | Status: DC
  Filled 2013-05-26: qty 2

## 2013-05-26 MED ORDER — LIDOCAINE HCL (PF) 1 % IJ SOLN
30.0000 mL | Freq: Once | INTRAMUSCULAR | Status: AC
  Administered 2013-05-26: 30 mL via INTRADERMAL
  Filled 2013-05-26: qty 30

## 2013-05-26 NOTE — ED Provider Notes (Signed)
CSN: [id number]     Arrival date & time 05/26/13  1847 History   First MD Initiated Contact with Patient 05/26/13 1936     Chief Complaint  Patient presents with  . bump on back of head    (Consider location/radiation/quality/duration/timing/severity/associated sxs/prior Treatment) HPI Comments: 62 yo male with DM hx presents with worsening scalp pain for 2 wks.  Gradually increased swelling.  No hx of mrsa or abscess. No fevers or vomiting. Pain with palpation.    The history is provided by the patient.    Past Medical History  Diagnosis Date  . Diabetes mellitus without complication   . Hypertension    Past Surgical History  Procedure Laterality Date  . Back surgery    . Finger surgery     No family history on file. History  Substance Use Topics  . Smoking status: Current Every Day Smoker    Types: Cigarettes  . Smokeless tobacco: Not on file  . Alcohol Use: No    Review of Systems  Constitutional: Negative for fever and chills.  HENT: Negative for neck pain.   Respiratory: Negative for shortness of breath.   Gastrointestinal: Negative for nausea and vomiting.  Skin: Positive for color change and wound.  Neurological: Negative for light-headedness.    Allergies  Review of patient's allergies indicates no known allergies.  Home Medications   Current Outpatient Rx  Name  Route  Sig  Dispense  Refill  . ibuprofen (ADVIL,MOTRIN) 200 MG tablet   Oral   Take 200 mg by mouth every 6 (six) hours as needed for pain.         [name] insulin NPH-regular (NOVOLIN 70/30) (70-30) 100 UNIT/ML injection   Subcutaneous   Inject 20 Units into the skin daily with breakfast.         . clindamycin (CLEOCIN) 300 MG capsule   Oral   Take 1 capsule (300 mg total) by mouth 3 (three) times daily. X 7 days   21 capsule   0   . HYDROcodone-acetaminophen (NORCO) 5-325 MG per tablet   Oral   Take 2 tablets by mouth every 4 (four) hours as needed for pain.   10 tablet   0    BP  147/79  Pulse 98  Temp(Src) 98.9 F (37.2 C) (Oral)  Resp 20  Ht 5\' 8"  (1.727 m)  Wt 269 lb 6.4 oz (122.2 kg)  BMI 40.97 kg/m2  SpO2 95% Physical Exam  Nursing note and vitals reviewed. Constitutional: He appears well-developed and well-nourished. No distress.  HENT:  Head: Normocephalic.  3 cm diameter region of mild induration, tender, mild fluctuance right posterior scalp, no surrounding erythema or warmth  Eyes: Conjunctivae and EOM are normal. Pupils are equal, round, and reactive to light.  Neck: Normal range of motion. Neck supple.  Cardiovascular: Normal rate and regular rhythm.   Pulmonary/Chest: Effort normal.  Musculoskeletal: Normal range of motion.    ED Course  Procedures (including critical care time) EMERGENCY DEPARTMENT US SOFT TISSUE INTERPRETATION "Study: Limited Soft Tissue Ultrasound"  INDICATIONS: Pain and Soft tissue infection Multiple views of the body part were obtained in real-time with a multi-frequency linear probe PERFORMED BY:  Myself IMAGES ARCHIVED?: Yes SIDE:Left BODY PART:Other soft tisse (comment in note) FINDINGS: Abcess present and Cellulitis absent  Small area of hypoechoic fluid approx 1 cm diameter INTERPRETATION:  Abcess present and No cellulitis noted    Labs Review Labs Reviewed - No data to display Imaging Review No results  found.  MDM   1. Scalp abscess    Since more gradual onset and mild fluctuance  Decision to needle aspirate after US showed small area of fluid. Small amount of pus pulled back after lidocaine used. Followed by 1.5 cm I/ D.  Mostly blood, mild pus drainage.  Cleaned. DC discussed. DM and on scalp, decision for abx.   INCISION AND DRAINAGE Performed by: Enid Skeens Consent: Verbal consent obtained. Risks and benefits: risks, benefits and alternatives were discussed Type: abscess  Body area: left scalp Anesthesia: local infiltration Incision was made with a scalpel. Local anesthetic:  lidocaine Anesthetic total: 6 ml Complexity: normal Blunt dissection to break up loculations Drainage: 2 ml  Patient tolerance: Patient tolerated the procedure well with no immediate complications.       Enid Skeens, MD 05/26/13 [phone]

## 2013-05-26 NOTE — ED Notes (Signed)
Pt has bump on back of neck that has been there a couple of weeks.  Pt states it did have some drainage that was clear.  Pt tried taking ibuprofen for pain.

## 2014-09-04 ENCOUNTER — Encounter: Payer: Self-pay | Admitting: Family Medicine

## 2014-09-04 ENCOUNTER — Ambulatory Visit (INDEPENDENT_AMBULATORY_CARE_PROVIDER_SITE_OTHER): Payer: Medicare HMO | Admitting: Family Medicine

## 2014-09-04 VITALS — BP 179/86 | HR 86 | Temp 99.0°F | Ht 68.5 in | Wt 254.9 lb

## 2014-09-04 DIAGNOSIS — Z720 Tobacco use: Secondary | ICD-10-CM | POA: Diagnosis not present

## 2014-09-04 DIAGNOSIS — E669 Obesity, unspecified: Secondary | ICD-10-CM

## 2014-09-04 DIAGNOSIS — E1149 Type 2 diabetes mellitus with other diabetic neurological complication: Secondary | ICD-10-CM | POA: Insufficient documentation

## 2014-09-04 DIAGNOSIS — E1159 Type 2 diabetes mellitus with other circulatory complications: Secondary | ICD-10-CM | POA: Insufficient documentation

## 2014-09-04 DIAGNOSIS — E119 Type 2 diabetes mellitus without complications: Secondary | ICD-10-CM | POA: Insufficient documentation

## 2014-09-04 DIAGNOSIS — I10 Essential (primary) hypertension: Secondary | ICD-10-CM | POA: Diagnosis not present

## 2014-09-04 DIAGNOSIS — F17200 Nicotine dependence, unspecified, uncomplicated: Secondary | ICD-10-CM

## 2014-09-04 LAB — CBC
HCT: 48.3 % (ref 39.0–52.0)
HEMOGLOBIN: 16.3 g/dL (ref 13.0–17.0)
MCH: 29.7 pg (ref 26.0–34.0)
MCHC: 33.7 g/dL (ref 30.0–36.0)
MCV: 88 fL (ref 78.0–100.0)
MPV: 10.3 fL (ref 9.4–12.4)
PLATELETS: 314 10*3/uL (ref 150–400)
RBC: 5.49 MIL/uL (ref 4.22–5.81)
RDW: 13.9 % (ref 11.5–15.5)
WBC: 8.7 10*3/uL (ref 4.0–10.5)

## 2014-09-04 LAB — POCT GLYCOSYLATED HEMOGLOBIN (HGB A1C): HEMOGLOBIN A1C: 12.4

## 2014-09-04 MED ORDER — INSULIN NPH ISOPHANE & REGULAR (70-30) 100 UNIT/ML ~~LOC~~ SUSP: 20.0000 [IU] | Freq: Every day | SUBCUTANEOUS | Status: DC

## 2014-09-04 MED ORDER — LISINOPRIL 10 MG PO TABS
10.0000 mg | ORAL_TABLET | Freq: Every day | ORAL | Status: DC
Start: 2014-09-04 — End: 2014-11-21

## 2014-09-04 MED ORDER — ASPIRIN 81 MG PO TABS: 81.0000 mg | ORAL_TABLET | Freq: Every day | ORAL | Status: DC

## 2014-09-04 MED ORDER — ATORVASTATIN CALCIUM 40 MG PO TABS: 40.0000 mg | ORAL_TABLET | Freq: Every day | ORAL | Status: DC

## 2014-09-04 NOTE — Assessment & Plan Note (Signed)
Roughly 20 pack-year history. Reluctant to quit due to fear of weight gain. Pre-contemplative. Will need AAA screening U/S at 63 years of age.

## 2014-09-04 NOTE — Progress Notes (Signed)
  Subjective:  Charles Fox is a 63 y.o. male with a history of insulin-dependent T2DM here to establish care.  He is a retired Recruitment consultant. Married to [place]on for 4 years. This is his third marriage. She has a 67 year old daughter who is a Therapist, sports at [employer]. This relationship is estranged. His son is 37 and lives with Mr. Sample. 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    Previous PCP: Dr. Heath Gold who moved to [place]. 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 Last time he saw her was a long time ago, then he and his wife moved to [place]. Welton Flakes who he has not seen for 10 years. He has seen a Dr on [address] and [place]. as recently as 3 months ago.   He has diabetes and reports recent poor control. He does not check it everyday as requested because he lost his glucometer. Both parents had diabetes, dad lost both legs due to complications. Diagnosed about 20 years ago. Started insulin 10 years ago. Takes 20 units of insulin NPH flexpen daily in the morning. Eats oatmeal at breakfast. Reports recently intentionally losing at 15 lbs.  - Last ophthalmology visit was within the past month or two.  - Has not been taking ASA, ACE/ARB, statin.  Has back surgery 10 years ago. 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 He is left handed.   - Colonoscopy: Declines screening at this time including hemoccult cards. No FH CA.  - Does not take flu shot because he thinks he always gets the flu if he gets the shot. He got the flu only once 40 years ago. - Declines pneumonia shot.    - Smokes 1/2 ppd x 40 years. [place] black. He has quit in the past for 2 months at the most and gained weight. He does not want to try now due to this fear.  - Does not drink alcohol or use other illicit drugs.  - Review of Systems: Per HPI. All other systems reviewed and are negative. - Medications: reviewed and updated Objective:  BP 179/86 mmHg  Pulse 86   Temp(Src) 99 F (37.2 C) (Oral)  Ht 5' 8.5" (1.74 m)  Wt 254 lb 14.4 oz (115.622 kg)  BMI 38.19 kg/m2 Gen: Obese, pleasant 63 y.o. male in no distress HEENT: MMM, EOMI, PERRL, anicteric muddy sclerae CV: Regular rate, no murmur, rub or gallop, no JVD Resp: Non-labored, CTAB, no wheezes noted Abd: Soft, obese, NT, ND, BS present, no guarding or organomegaly. No bruit MSK: No edema noted, full ROM See diabetic foot exam elsewhere Neuro: Alert and oriented, speech normal Assessment/Plan:  Charles Fox is a 63 y.o. male here to establish care.

## 2014-09-04 NOTE — Assessment & Plan Note (Signed)
Dx x20 yrs. Insulin-dependent x10 yrs. Taking once daily NPH? Poor PCP f/u to date.  Hb A1c: 12.4%  Goal Hb A1c: < 7.0%.  - Will augment insulin regimen at follow up. Would likely benefit from diabetic education.  - Starting ACE, ASA, statin - No evidence of neuropathy on exam today - Eye exam is up to date.  - Follow up in 3 months to include Hb A1c, and foot exam.

## 2014-09-04 NOTE — Assessment & Plan Note (Signed)
BMI 38. Discussed increasing activity and other therapeutic lifestyle changes including carbohydrate limitation as a part of diabetic management. Would benefit from nutritionist. Will refer at follow up.

## 2014-09-04 NOTE — Assessment & Plan Note (Signed)
No sxs. Starting ACE inhibitor today. Will titrate at follow up.

## 2014-09-05 ENCOUNTER — Telehealth: Payer: Self-pay | Admitting: Family Medicine

## 2014-09-05 DIAGNOSIS — E119 Type 2 diabetes mellitus without complications: Secondary | ICD-10-CM

## 2014-09-05 LAB — COMPREHENSIVE METABOLIC PANEL
ALT: 17 U/L (ref 0–53)
AST: 14 U/L (ref 0–37)
Albumin: 4.1 g/dL (ref 3.5–5.2)
Alkaline Phosphatase: 113 U/L (ref 39–117)
BUN: 9 mg/dL (ref 6–23)
CALCIUM: 9.5 mg/dL (ref 8.4–10.5)
CHLORIDE: 98 meq/L (ref 96–112)
CO2: 22 meq/L (ref 19–32)
CREATININE: 0.78 mg/dL (ref 0.50–1.35)
Glucose, Bld: 288 mg/dL — ABNORMAL HIGH (ref 70–99)
Potassium: 4.4 mEq/L (ref 3.5–5.3)
SODIUM: 131 meq/L — AB (ref 135–145)
TOTAL PROTEIN: 7.4 g/dL (ref 6.0–8.3)
Total Bilirubin: 0.6 mg/dL (ref 0.2–1.2)

## 2014-09-05 LAB — LIPID PANEL
CHOL/HDL RATIO: 4.7 ratio
Cholesterol: 225 mg/dL — ABNORMAL HIGH (ref 0–200)
HDL: 48 mg/dL (ref >39)
LDL CALC: 159 mg/dL — AB (ref 0–99)
Triglycerides: 88 mg/dL (ref <150)
VLDL: 18 mg/dL (ref 0–40)

## 2014-09-05 LAB — TSH: TSH: 1.104 u[IU]/mL (ref 0.350–4.500)

## 2014-09-05 MED ORDER — ATORVASTATIN CALCIUM 80 MG PO TABS: 80.0000 mg | ORAL_TABLET | Freq: Every day | ORAL | Status: DC

## 2014-09-05 NOTE — Telephone Encounter (Signed)
Recent labs show uncontrolled hyperlipidemia with pt taking lipitor 40mg  daily.   - Increase to 80mg  daily and recheck at follow up.

## 2014-09-09 NOTE — Telephone Encounter (Signed)
[facility] calling to verify Lipitor strength.  Received Rx for Lipitor 40 mg on 12/17 and Lipitor 80 mg on 12/18.   Informed pharmacy that dose was increased to 80 mg on 09/05/14.  Satya (pharmacist) informed.  Also, called and informed patient of dosage change.    Burna Forts, BSN, RN-BC

## 2014-10-09 ENCOUNTER — Ambulatory Visit: Payer: Medicare Other | Admitting: Family Medicine

## 2014-10-23 ENCOUNTER — Ambulatory Visit: Payer: Medicare Other | Admitting: Family Medicine

## 2014-11-13 ENCOUNTER — Ambulatory Visit: Payer: Medicare Other | Admitting: Family Medicine

## 2014-11-19 ENCOUNTER — Encounter: Payer: Self-pay | Admitting: Gastroenterology

## 2014-11-19 ENCOUNTER — Ambulatory Visit (INDEPENDENT_AMBULATORY_CARE_PROVIDER_SITE_OTHER): Payer: Medicare HMO | Admitting: Family Medicine

## 2014-11-19 ENCOUNTER — Encounter: Payer: Self-pay | Admitting: Family Medicine

## 2014-11-19 VITALS — BP 165/78 | HR 87 | Temp 98.2°F | Ht 68.5 in | Wt 261.9 lb

## 2014-11-19 DIAGNOSIS — Z5181 Encounter for therapeutic drug level monitoring: Secondary | ICD-10-CM | POA: Diagnosis not present

## 2014-11-19 DIAGNOSIS — E669 Obesity, unspecified: Secondary | ICD-10-CM

## 2014-11-19 DIAGNOSIS — I10 Essential (primary) hypertension: Secondary | ICD-10-CM

## 2014-11-19 DIAGNOSIS — E119 Type 2 diabetes mellitus without complications: Secondary | ICD-10-CM

## 2014-11-19 DIAGNOSIS — Z1211 Encounter for screening for malignant neoplasm of colon: Secondary | ICD-10-CM

## 2014-11-19 LAB — BASIC METABOLIC PANEL
BUN: 13 mg/dL (ref 6–23)
CALCIUM: 9.7 mg/dL (ref 8.4–10.5)
CHLORIDE: 99 meq/L (ref 96–112)
CO2: 24 mEq/L (ref 19–32)
CREATININE: 0.84 mg/dL (ref 0.50–1.35)
Glucose, Bld: 330 mg/dL — ABNORMAL HIGH (ref 70–99)
Potassium: 4.5 mEq/L (ref 3.5–5.3)
Sodium: 134 mEq/L — ABNORMAL LOW (ref 135–145)

## 2014-11-19 LAB — POCT GLYCOSYLATED HEMOGLOBIN (HGB A1C): Hemoglobin A1C: 11.6

## 2014-11-19 MED ORDER — INSULIN GLARGINE 100 UNIT/ML SOLOSTAR PEN: 30.0000 [IU] | PEN_INJECTOR | Freq: Every day | SUBCUTANEOUS | Status: DC

## 2014-11-19 NOTE — Progress Notes (Signed)
Subjective: Charles Fox is a 64 y.o. male here for diabetes follow up.   He was diagnosed with T2DM 20 years ago. Began insulin about 10 years ago. Takes NPH-regular insulin 70/70 once a day (?), reporting 0% compliance due to running out of insulin. No attempt to obtain refills was made. Never checks blood sugar at home. Denies feeling diaphoretic, weak, acutely hungry or any other hypoglycemic symptoms. Is not able to explain common hypoglycemic symptoms nor appropriate action plan in the event of these symptoms/low CBG.   He walks to mailbox and back everyday. Does not like bread because it "smells." Eats chef salads with chicken. Likes potatoes. Eats eggs in the morning. He reports that he would need to come off of insulin in order to go back to work at the [employer] and he is wondering if this is a viable possibility.   Started on ACE inhibitor last visit. Denies any problems with this. No cough. Doesn't check BP at home and has not had this medication for months. Started on ASA last visit. No abnormal bleeding.  Started on high-intensity statin at last visit. Denies muscle aches or other symptoms  Still smoking  Eye exam: UTD per pt report Last foot exam: Today Flu, TDaP and pneumovax: All due, all declined  - Also desires information regarding viagra; though pt agrees that this is best set aside for another office visit.  - ROS: Denies fever, chills, weight loss, dizziness, vision changes, syncope, polyuria, nocturia, paresthesias, polydipsia, chest pain, new wounds.  All other systems reviewed and are negative. - Medications: reviewed and updated  Objective: BP 165/78 mmHg  Pulse 87  Temp(Src) 98.2 F (36.8 C) (Oral)  Ht 5' 8.5" (1.74 m)  Wt 261 lb 14.4 oz (118.797 kg)  BMI 39.24 kg/m2 Gen: Well-appearing obese 64 y.o. male in no distress HEENT: MMM, posterior oropharynx clear, fair dentition Neck: Neck supple, no masses or lymphadenopathy; thyroid not enlarged  Pulm:  Non-labored; CTAB, no wheezes  CV: Regular rate, no murmur appreciated; no LE edema, no JVD, no displacement of PMI GI: Normoactive BS; soft, non-tender, non-distended, no HSM Skin: No wounds or rashes, no acanthosis nigricans noted See diabetic foot exam - simple  Neuro: CN II-XII without deficits, sensation intact to light touch, steady gait. Psych: Mood is euthymic with a mildly restricted affect. Insight and judgment are both poor. No suicidal or homicidal ideation.  Lab Results  Component Value Date   HGBA1C 11.6 11/19/2014   Assessment & Plan: Charles Fox is a 64 y.o. male here for diabetes, poorly controlled.    See problem list for problem-specific plans.

## 2014-11-19 NOTE — Patient Instructions (Addendum)
Your diabetes in not controlled at all. Make sure you never run out of insulin.  Make a follow up appointment in our pharmacy clinic with Dr. [name] as soon as possible.

## 2014-11-20 ENCOUNTER — Telehealth: Payer: Self-pay | Admitting: Family Medicine

## 2014-11-20 DIAGNOSIS — E119 Type 2 diabetes mellitus without complications: Secondary | ICD-10-CM

## 2014-11-20 DIAGNOSIS — I10 Essential (primary) hypertension: Secondary | ICD-10-CM

## 2014-11-20 LAB — MICROALBUMIN, URINE: MICROALB UR: 14.8 mg/dL — AB (ref <2.0)

## 2014-11-20 NOTE — Telephone Encounter (Signed)
Pt called and was told to call back with the fax number so that his doctor can call in his medications. He seen Dr. Bonner Puna yesterday. Please fax them to [phone]. jw

## 2014-11-21 ENCOUNTER — Encounter: Payer: Self-pay | Admitting: Family Medicine

## 2014-11-21 MED ORDER — ASPIRIN 81 MG PO TABS: 81.0000 mg | ORAL_TABLET | Freq: Every day | ORAL | Status: DC

## 2014-11-21 MED ORDER — LISINOPRIL 10 MG PO TABS: 10.0000 mg | ORAL_TABLET | Freq: Every day | ORAL | Status: DC

## 2014-11-21 MED ORDER — INSULIN GLARGINE 100 UNIT/ML SOLOSTAR PEN: 30.0000 [IU] | PEN_INJECTOR | Freq: Every day | SUBCUTANEOUS | Status: DC

## 2014-11-21 MED ORDER — ATORVASTATIN CALCIUM 80 MG PO TABS: 80.0000 mg | ORAL_TABLET | Freq: Every day | ORAL | Status: DC

## 2014-11-21 NOTE — Telephone Encounter (Signed)
Printed prescriptions for aspirin, lipitor, lisinopril, and lantus and placed in to be faxed to [phone].   Appt in pharmacy clinic noted 3/7.

## 2014-11-21 NOTE — Assessment & Plan Note (Signed)
BP not controlled, though pt not on prescribed ACE inhibitor. Will defer change in treatment. Follow up with BP check once on medications x 2 weeks.

## 2014-11-21 NOTE — Assessment & Plan Note (Signed)
Hb A1c: 11.6% (DOWN from 12.4) - In desperate need of extensive diabetic education and transition to sensible insulin regimen. For today, I discontinued 70/30 and replaced it with lantus solostar 30 units qAM and asked that he record at least 2 CBGs, including AM fasting, per day.  - Will likely benefit from nutritional counseling as well, though this is secondary at this time as I do not want to overwhelm him with tasks.  - Recheck K and Cr as well as microalbuminuria given somewhat recent addition and now restarting of ACE.  - Continue ASA daily - Continue statin  - Follow up soon in pharmacy clinic

## 2014-11-24 ENCOUNTER — Ambulatory Visit: Payer: Medicare Other | Admitting: Pharmacist

## 2014-11-25 ENCOUNTER — Other Ambulatory Visit: Payer: Self-pay | Admitting: *Deleted

## 2014-11-25 DIAGNOSIS — E119 Type 2 diabetes mellitus without complications: Secondary | ICD-10-CM

## 2014-11-25 DIAGNOSIS — I10 Essential (primary) hypertension: Secondary | ICD-10-CM

## 2014-11-28 ENCOUNTER — Encounter: Payer: Self-pay | Admitting: Family Medicine

## 2014-11-28 ENCOUNTER — Other Ambulatory Visit: Payer: Self-pay | Admitting: *Deleted

## 2014-11-28 DIAGNOSIS — E119 Type 2 diabetes mellitus without complications: Secondary | ICD-10-CM

## 2014-11-28 DIAGNOSIS — I10 Essential (primary) hypertension: Secondary | ICD-10-CM

## 2014-11-28 NOTE — Progress Notes (Signed)
Pt came in and is concerned because he needs his insulin and his insurance needs permission from his Dr. In order to cover it please call pt when complete @ [phone] *note* I scanned letter into his documents along with his [employer] card / sr

## 2014-12-01 ENCOUNTER — Telehealth: Payer: Self-pay | Admitting: Family Medicine

## 2014-12-01 NOTE — Telephone Encounter (Signed)
Pt needs his insulin but cannot receive it until letter that was mentioned on Friday is complete: "Pt came in and is concerned because he needs his insulin and his insurance needs permission from his Dr. In order to cover it please call pt when complete" / thanks sr

## 2014-12-02 MED ORDER — ATORVASTATIN CALCIUM 80 MG PO TABS: 80.0000 mg | ORAL_TABLET | Freq: Every day | ORAL | Status: DC

## 2014-12-02 MED ORDER — LISINOPRIL 10 MG PO TABS: 10.0000 mg | ORAL_TABLET | Freq: Every day | ORAL | Status: DC

## 2014-12-03 NOTE — Progress Notes (Signed)
I see the letter presented by Mr. Sennett but I see no form that I am to fill out. Can you point me in the right direction? Ideally, I'd like this form to be placed in my box to be filled out. Thanks!

## 2014-12-04 NOTE — Telephone Encounter (Signed)
Called pt and told him that it appeared that the letter from [facility] we scanned in his chart was missing the fax portion and he stated not to worry about it but I told him I would try and contact them and see if i could obtain that portion of the letter.  They faxed me a copy of the Physician Fax Form and I filled out what parts I could and I placed it in Dr. Bonner Puna box.

## 2014-12-04 NOTE — Progress Notes (Signed)
Called pt and told him that it appeared that the letter from [facility] we scanned in his chart was missing the fax portion and he stated not to worry about it but I told him I would try and contact them and see if i could obtain that portion of the letter. They faxed me a copy of the Physician Fax Form and I filled out what parts I could and I placed it in Dr. Bonner Puna box. Katharina Caper, April D

## 2014-12-10 ENCOUNTER — Other Ambulatory Visit: Payer: Self-pay | Admitting: *Deleted

## 2014-12-10 DIAGNOSIS — E119 Type 2 diabetes mellitus without complications: Secondary | ICD-10-CM

## 2014-12-10 DIAGNOSIS — I10 Essential (primary) hypertension: Secondary | ICD-10-CM

## 2014-12-10 NOTE — Telephone Encounter (Signed)
Patient written for 90 day supply with 3 additional refills on both of these medications. Will not refill at this time.

## 2014-12-23 ENCOUNTER — Encounter (HOSPITAL_COMMUNITY): Payer: Self-pay | Admitting: Emergency Medicine

## 2014-12-23 ENCOUNTER — Emergency Department (INDEPENDENT_AMBULATORY_CARE_PROVIDER_SITE_OTHER)
Admission: EM | Admit: 2014-12-23 | Discharge: 2014-12-23 | Disposition: A | Discharge status: Home / Self Care | Payer: Medicare HMO | Source: Home / Self Care | Attending: Family Medicine | Admitting: Family Medicine

## 2014-12-23 DIAGNOSIS — R739 Hyperglycemia, unspecified: Secondary | ICD-10-CM | POA: Diagnosis not present

## 2014-12-23 LAB — POCT URINALYSIS DIP (DEVICE)
Bilirubin Urine: NEGATIVE
Ketones, ur: NEGATIVE mg/dL
LEUKOCYTES UA: NEGATIVE
Nitrite: NEGATIVE
Protein, ur: 100 mg/dL — AB
SPECIFIC GRAVITY, URINE: 1.025 (ref 1.005–1.030)
UROBILINOGEN UA: 0.2 mg/dL (ref 0.0–1.0)
pH: 6.5 (ref 5.0–8.0)

## 2014-12-23 LAB — POCT I-STAT, CHEM 8
BUN: 12 mg/dL (ref 6–23)
CREATININE: 0.7 mg/dL (ref 0.50–1.35)
Calcium, Ion: 1.25 mmol/L (ref 1.13–1.30)
Chloride: 98 mmol/L (ref 96–112)
Glucose, Bld: 353 mg/dL — ABNORMAL HIGH (ref 70–99)
HCT: 48 % (ref 39.0–52.0)
Hemoglobin: 16.3 g/dL (ref 13.0–17.0)
POTASSIUM: 4.1 mmol/L (ref 3.5–5.1)
SODIUM: 135 mmol/L (ref 135–145)
TCO2: 24 mmol/L (ref 0–100)

## 2014-12-23 MED ORDER — INSULIN SYRINGES (DISPOSABLE) U-100 0.3 ML MISC: Status: DC

## 2014-12-23 MED ORDER — INSULIN GLARGINE 100 UNIT/ML ~~LOC~~ SOLN: 30.0000 [IU] | Freq: Every day | SUBCUTANEOUS | Status: DC

## 2014-12-23 NOTE — ED Provider Notes (Signed)
CSN: [id number]     Arrival date & time 12/23/14  1825 History   First MD Initiated Contact with Patient 12/23/14 1950     Chief Complaint  Patient presents with  . Medication Refill   (Consider location/radiation/quality/duration/timing/severity/associated sxs/prior Treatment) HPI Comments: Patient presents stating he is out of insulin and has been without for about 45 days. States he has not been able to afford current prescription as written. Has developed polydipsia and mild fatigue over the past few days. These symptoms are what prompted tonight's visit. Denies polyuria.   The history is provided by the patient.    Past Medical History  Diagnosis Date  . Diabetes mellitus without complication   . Hypertension    Past Surgical History  Procedure Laterality Date  . Back surgery    . Finger surgery     History reviewed. No pertinent family history. History  Substance Use Topics  . Smoking status: Current Every Day Smoker -- 1.00 packs/day    Types: Cigarettes  . Smokeless tobacco: Never Used  . Alcohol Use: No    Review of Systems  All other systems reviewed and are negative.   Allergies  Review of patient's allergies indicates no known allergies.  Home Medications   Prior to Admission medications   Medication Sig Start Date End Date Taking? Authorizing Provider  aspirin 81 MG tablet Take 1 tablet (81 mg total) by mouth daily. 11/21/14  Yes Patrecia Pour, MD  atorvastatin (LIPITOR) 80 MG tablet Take 1 tablet (80 mg total) by mouth daily. 12/02/14  Yes Patrecia Pour, MD  lisinopril (PRINIVIL,ZESTRIL) 10 MG tablet Take 1 tablet (10 mg total) by mouth daily. 12/02/14  Yes Patrecia Pour, MD  insulin glargine (LANTUS) 100 UNIT/ML injection Inject 0.3 mLs (30 Units total) into the skin daily before breakfast. 12/23/14   Lutricia Feil, PA  Insulin Syringes, Disposable, U-100 0.3 ML MISC As directed 12/23/14   Annett Gula H Jermond Burkemper, PA   BP 160/90 mmHg  Pulse 68  Temp(Src)  98.2 F (36.8 C) (Oral)  Resp 16  SpO2 96% Physical Exam  Constitutional: He is oriented to person, place, and time. He appears well-developed and well-nourished. No distress.  obese  HENT:  Head: Normocephalic and atraumatic.  Mouth/Throat: Oropharynx is clear and moist.  Eyes: Conjunctivae are normal. No scleral icterus.  Cardiovascular: Normal rate, regular rhythm and normal heart sounds.   Pulmonary/Chest: Effort normal and breath sounds normal. No respiratory distress. He has no wheezes.  Musculoskeletal: Normal range of motion.  Neurological: He is alert and oriented to person, place, and time.  Skin: Skin is warm and dry.  Psychiatric: He has a normal mood and affect. His behavior is normal.  Nursing note and vitals reviewed.   ED Course  Procedures (including critical care time) Labs Review Labs Reviewed  POCT I-STAT, CHEM 8 - Abnormal; Notable for the following:    Glucose, Bld 353 (*)    All other components within normal limits  POCT URINALYSIS DIP (DEVICE) - Abnormal; Notable for the following:    Glucose, UA >=1000 (*)    Hgb urine dipstick TRACE (*)    Protein, ur 100 (*)    All other components within normal limits    Imaging Review No results found.   MDM   1. Hyperglycemia     Labs consistent with hyperglycemia without evidence of DKA or other electrolyte abnormalities. Vital signs only with moderate elevation of BP and without tachycardia.  Mental status normal.  Urine with normal spec grav and without ketonuria. i suspect if this patient could resume his insulin regimen in the next 12-24 hours, stick to low carb/low sugar diet and increase oral water intake over the next 1-2 days, glucose would normalize and can potentially avoid hospitalization. I will encourage above and provide him with insulin Rx and advise close follow up with his PCP at [facility] in the next 24-48 hours.   Lutricia Feil, [place] 12/23/14 2049

## 2014-12-23 NOTE — Discharge Instructions (Signed)
Labs consistent with elevated blood sugar without evidence of need for hospitalization or other electrolyte abnormalities. Vital signs only with moderate elevation of blood pressure. I suspect if you can resume your insulin regimen in the next 1-24 hours, stick to low carbohydrate/low sugar diet ( a diabetic diet) and increase oral water intake over the next 1-2 days, your blood sugar may normalize and you can potentially avoid hospitalization. No more soft drinks. You will need to follow up with your doctor at [facility] in the next 1-2 days to make sure things are improving. Hyperglycemia Hyperglycemia occurs when the glucose (sugar) in your blood is too high. Hyperglycemia can happen for many reasons, but it most often happens to people who do not know they have diabetes or are not managing their diabetes properly.  CAUSES  Whether you have diabetes or not, there are other causes of hyperglycemia. Hyperglycemia can occur when you have diabetes, but it can also occur in other situations that you might not be as aware of, such as: Diabetes  If you have diabetes and are having problems controlling your blood glucose, hyperglycemia could occur because of some of the following reasons:  Not following your meal plan.  Not taking your diabetes medications or not taking it properly.  Exercising less or doing less activity than you normally do.  Being sick. Pre-diabetes  This cannot be ignored. Before people develop Type 2 diabetes, they almost always have "pre-diabetes." This is when your blood glucose levels are higher than normal, but not yet high enough to be diagnosed as diabetes. Research has shown that some long-term damage to the body, especially the heart and circulatory system, may already be occurring during pre-diabetes. If you take action to manage your blood glucose when you have pre-diabetes, you may delay or prevent Type 2 diabetes from developing. Stress  If  you have diabetes, you may be "diet" controlled or on oral medications or insulin to control your diabetes. However, you may find that your blood glucose is higher than usual in the hospital whether you have diabetes or not. This is often referred to as "stress hyperglycemia." Stress can elevate your blood glucose. This happens because of hormones put out by the body during times of stress. If stress has been the cause of your high blood glucose, it can be followed regularly by your caregiver. That way he/she can make sure your hyperglycemia does not continue to get worse or progress to diabetes. Steroids  Steroids are medications that act on the infection fighting system (immune system) to block inflammation or infection. One side effect can be a rise in blood glucose. Most people can produce enough extra insulin to allow for this rise, but for those who cannot, steroids make blood glucose levels go even higher. It is not unusual for steroid treatments to "uncover" diabetes that is developing. It is not always possible to determine if the hyperglycemia will go away after the steroids are stopped. A special blood test called an A1c is sometimes done to determine if your blood glucose was elevated before the steroids were started. SYMPTOMS  Thirsty.  Frequent urination.  Dry mouth.  Blurred vision.  Tired or fatigue.  Weakness.  Sleepy.  Tingling in feet or leg. DIAGNOSIS  Diagnosis is made by monitoring blood glucose in one or all of the following ways:  A1c test. This is a chemical found in your blood.  Fingerstick blood glucose monitoring.  Laboratory results. TREATMENT  First, knowing  the cause of the hyperglycemia is important before the hyperglycemia can be treated. Treatment may include, but is not be limited to:  Education.  Change or adjustment in medications.  Change or adjustment in meal plan.  Treatment for an illness, infection, etc.  More frequent blood glucose  monitoring.  Change in exercise plan.  Decreasing or stopping steroids.  Lifestyle changes. HOME CARE INSTRUCTIONS   Test your blood glucose as directed.  Exercise regularly. Your caregiver will give you instructions about exercise. Pre-diabetes or diabetes which comes on with stress is helped by exercising.  Eat wholesome, balanced meals. Eat often and at regular, fixed times. Your caregiver or nutritionist will give you a meal plan to guide your sugar intake.  Being at an ideal weight is important. If needed, losing as little as 10 to 15 pounds may help improve blood glucose levels. SEEK MEDICAL CARE IF:   You have questions about medicine, activity, or diet.  You continue to have symptoms (problems such as increased thirst, urination, or weight gain). SEEK IMMEDIATE MEDICAL CARE IF:   You are vomiting or have diarrhea.  Your breath smells fruity.  You are breathing faster or slower.  You are very sleepy or incoherent.  You have numbness, tingling, or pain in your feet or hands.  You have chest pain.  Your symptoms get worse even though you have been following your caregiver's orders.  If you have any other questions or concerns. Document Released: 03/01/2001 Document Revised: 11/28/2011 Document Reviewed: 01/02/2012 [facility] Patient Information 2015 [place], [place]. This information is not intended to replace advice given to you by your health care provider. Make sure you discuss any questions you have with your health care provider.

## 2014-12-23 NOTE — ED Notes (Addendum)
Pt is here to get some insulin.  He is looking to have insulin tonight, but he has not had any insulin Lantus 30U) in at least a month.  Pt states he has been feeling very poorly. Pt does not check his BS at home.

## 2015-01-29 ENCOUNTER — Other Ambulatory Visit: Payer: Self-pay | Admitting: Family Medicine

## 2015-01-29 NOTE — Telephone Encounter (Signed)
Needs a refill on his insulin glargine (LANTUS). He is out today. Please notify the patient when ready. Thank you, Fonda Kinder, ASA

## 2015-01-30 MED ORDER — INSULIN GLARGINE 100 UNIT/ML ~~LOC~~ SOLN: 30.0000 [IU] | Freq: Every day | SUBCUTANEOUS | Status: DC

## 2015-02-02 ENCOUNTER — Telehealth: Payer: Self-pay | Admitting: Family Medicine

## 2015-02-02 ENCOUNTER — Encounter: Payer: Medicare Other | Admitting: Gastroenterology

## 2015-02-02 NOTE — Telephone Encounter (Signed)
Is out of insulin but doesn't have any money to buy anymore He has an appt June 2 another number  [phone]

## 2015-02-02 NOTE — Telephone Encounter (Signed)
Will send message to MD to see what they suggest.  We don't have any lantus samples here in clinic.  Jazmin Hartsell,CMA

## 2015-02-04 MED ORDER — INSULIN GLARGINE 100 UNIT/ML ~~LOC~~ SOLN: 30.0000 [IU] | Freq: Every day | SUBCUTANEOUS | Status: DC

## 2015-02-04 NOTE — Telephone Encounter (Addendum)
Pt in clinic for refill on Lantus. He is out of medication and appt June 3.  Refill approved on 01/29/15 and sent to [facility].  Medication resent to [facility].  Pt did not have money for co-pay. $47 co-pay given from [facility] funds one time only.  Derl Barrow, RN

## 2015-02-04 NOTE — Addendum Note (Signed)
Addended by: Derl Barrow on: 02/04/2015 09:17 AM   Modules accepted: Orders

## 2015-02-06 NOTE — Telephone Encounter (Signed)
This is a tough and ongoing situation for him. All I know to do is to have him seen in the pharmacy clinic to get switched back to [facility] which is cheaper and see if Constance Holster can help.   [facility] team: can you please call to have him schedule an appt with Dr. [name]?

## 2015-02-06 NOTE — Telephone Encounter (Signed)
LM for patient to call back.   Please schedule an appt for him.  Jazmin Hartsell,CMA

## 2015-02-11 NOTE — Telephone Encounter (Signed)
Attempted to call again, same response.   Pt has appt with PCP on 02/20/15.    Dr. Bonner Puna,  [facility] to wait until then? [name] Mirsky, Salome Spotted, CMA

## 2015-02-11 NOTE — Telephone Encounter (Signed)
I too have tried to call him without an answer. We will have to keep trying because this is dangerous for him not to have insulin. But we can only do what we can do so I will try to call once more tomorrow and certainly will call him if he misses that appointment.

## 2015-02-20 ENCOUNTER — Encounter: Payer: Self-pay | Admitting: Family Medicine

## 2015-02-20 ENCOUNTER — Ambulatory Visit (INDEPENDENT_AMBULATORY_CARE_PROVIDER_SITE_OTHER): Payer: Medicare HMO | Admitting: Family Medicine

## 2015-02-20 VITALS — BP 135/55 | HR 92 | Temp 98.2°F | Ht 69.0 in | Wt 263.4 lb

## 2015-02-20 DIAGNOSIS — IMO0001 Reserved for inherently not codable concepts without codable children: Secondary | ICD-10-CM

## 2015-02-20 DIAGNOSIS — Z794 Long term (current) use of insulin: Secondary | ICD-10-CM | POA: Diagnosis not present

## 2015-02-20 DIAGNOSIS — E119 Type 2 diabetes mellitus without complications: Secondary | ICD-10-CM

## 2015-02-20 LAB — POCT GLYCOSYLATED HEMOGLOBIN (HGB A1C): Hemoglobin A1C: 11.5

## 2015-02-20 MED ORDER — ONETOUCH ULTRASOFT LANCETS MISC: Status: DC

## 2015-02-20 MED ORDER — GLUCOSE BLOOD VI STRP: ORAL_STRIP | Status: DC

## 2015-02-20 MED ORDER — INSULIN GLARGINE 100 UNIT/ML ~~LOC~~ SOLN: 40.0000 [IU] | Freq: Every day | SUBCUTANEOUS | Status: DC

## 2015-02-20 MED ORDER — ONETOUCH ULTRA 2 W/DEVICE KIT: 1.0000 | PACK | Freq: Once | Status: DC

## 2015-02-20 NOTE — Progress Notes (Signed)
Subjective: Charles Fox is a 64 y.o. male here for diabetes follow up.   He was diagnosed with DM 20 years ago, insulin 10 years ago. Has been taking insulin lantus 30 units in the morning since he got it, which was last month. His glucometer no longer works so he has not been checking his blood sugars. He denies hypoglycemic symptoms.   He's trying to walk more but it's getting too hot to walk other than in the early morning. His wife is trying to support him to do this more and learn swimming.   Last eye exam: Can't remember and can't afford ophthalmology visit copay. Last foot exam: March 2016  - ROS: Denies fever, chills, weight loss, dizziness, vision changes, syncope, polyuria, nocturia, paresthesias, polydipsia, chest pain, new wounds.  All other systems reviewed and are negative.  - Medications: reviewed and updated Patient Active Problem List   Diagnosis Date Noted  . Diabetes mellitus 09/04/2014  . Obesity 09/04/2014  . HTN 09/04/2014  . Tobacco use disorder 09/04/2014   Objective: BP 135/55 mmHg  Pulse 92  Temp(Src) 98.2 F (36.8 C) (Oral)  Ht 5\' 9"  (1.753 m)  Wt 263 lb 6.4 oz (119.477 kg)  BMI 38.88 kg/m2 Gen: Obese, well-appearing 63 y.o.male in no distress HEENT: Normocephalic, sclerae/conjunctivae clear, PERRL, MMM, posterior oropharynx clear, fair dentition Neck: Neck supple, no masses or lymphadenopathy; thyroid not enlarged  Pulm: Non-labored; CTAB, no wheezes  CV: Regular rate, no murmur appreciated; no LE edema, no JVD Skin: No wounds or rashes See diabetic foot exam - simple  Neuro: CN II-XII without deficits, sensation intact to light touch, steady gait.  Assessment & Plan: Charles Fox is a 64 y.o. male here for diabetes, very poorly controlled.    See problem list for problem-specific plans.

## 2015-02-20 NOTE — Assessment & Plan Note (Signed)
Hb A1c: 11.5% though he only took insulin for about 1 month of the past 3. Plan: Increase lantus to 40u daily; will likely need more. Prescribed medicare-covered onetouch ultra 2 meter and supplies; will check AM fasting CBG x 4 weeks and record these values.

## 2015-02-20 NOTE — Patient Instructions (Signed)
Start taking lantus 40 units (up from 30) in the morning. Measure and record your blood sugars first thing in the morning and bring this record in for me to see in about 4 weeks.

## 2015-04-03 ENCOUNTER — Emergency Department (HOSPITAL_COMMUNITY)
Admission: EM | Admit: 2015-04-03 | Discharge: 2015-04-03 | Disposition: A | Discharge status: Home / Self Care | Payer: Medicare HMO | Attending: Emergency Medicine | Admitting: Emergency Medicine

## 2015-04-03 ENCOUNTER — Encounter (HOSPITAL_COMMUNITY): Payer: Self-pay | Admitting: Emergency Medicine

## 2015-04-03 DIAGNOSIS — I10 Essential (primary) hypertension: Secondary | ICD-10-CM | POA: Diagnosis not present

## 2015-04-03 DIAGNOSIS — Z760 Encounter for issue of repeat prescription: Secondary | ICD-10-CM

## 2015-04-03 DIAGNOSIS — L02212 Cutaneous abscess of back [any part, except buttock]: Secondary | ICD-10-CM | POA: Insufficient documentation

## 2015-04-03 DIAGNOSIS — E119 Type 2 diabetes mellitus without complications: Secondary | ICD-10-CM | POA: Insufficient documentation

## 2015-04-03 DIAGNOSIS — Z7982 Long term (current) use of aspirin: Secondary | ICD-10-CM | POA: Insufficient documentation

## 2015-04-03 DIAGNOSIS — Z79899 Other long term (current) drug therapy: Secondary | ICD-10-CM | POA: Insufficient documentation

## 2015-04-03 DIAGNOSIS — Z794 Long term (current) use of insulin: Secondary | ICD-10-CM | POA: Insufficient documentation

## 2015-04-03 DIAGNOSIS — L0291 Cutaneous abscess, unspecified: Secondary | ICD-10-CM

## 2015-04-03 DIAGNOSIS — Z720 Tobacco use: Secondary | ICD-10-CM | POA: Insufficient documentation

## 2015-04-03 DIAGNOSIS — M549 Dorsalgia, unspecified: Secondary | ICD-10-CM | POA: Diagnosis present

## 2015-04-03 DIAGNOSIS — IMO0001 Reserved for inherently not codable concepts without codable children: Secondary | ICD-10-CM

## 2015-04-03 LAB — CBG MONITORING, ED: Glucose-Capillary: 305 mg/dL — ABNORMAL HIGH (ref 65–99)

## 2015-04-03 LAB — I-STAT CHEM 8, ED
BUN: 14 mg/dL (ref 6–20)
CHLORIDE: 99 mmol/L — AB (ref 101–111)
Calcium, Ion: 1.22 mmol/L (ref 1.13–1.30)
Creatinine, Ser: 0.8 mg/dL (ref 0.61–1.24)
Glucose, Bld: 324 mg/dL — ABNORMAL HIGH (ref 65–99)
HCT: 46 % (ref 39.0–52.0)
HEMOGLOBIN: 15.6 g/dL (ref 13.0–17.0)
POTASSIUM: 4.5 mmol/L (ref 3.5–5.1)
Sodium: 134 mmol/L — ABNORMAL LOW (ref 135–145)
TCO2: 23 mmol/L (ref 0–100)

## 2015-04-03 MED ORDER — LIDOCAINE-EPINEPHRINE (PF) 2 %-1:200000 IJ SOLN
10.0000 mL | Freq: Once | INTRAMUSCULAR | Status: AC
  Administered 2015-04-03: 10 mL
  Filled 2015-04-03: qty 20

## 2015-04-03 MED ORDER — INSULIN GLARGINE 100 UNIT/ML ~~LOC~~ SOLN: 40.0000 [IU] | Freq: Every day | SUBCUTANEOUS | Status: DC

## 2015-04-03 MED ORDER — SULFAMETHOXAZOLE-TRIMETHOPRIM 800-160 MG PO TABS: 1.0000 | ORAL_TABLET | Freq: Two times a day (BID) | ORAL | Status: AC

## 2015-04-03 NOTE — ED Notes (Signed)
Discussed pt with [place], [place].  Verbal order received for Chem 8 and pt will be seen in [facility].

## 2015-04-03 NOTE — ED Notes (Signed)
CBG: 305 

## 2015-04-03 NOTE — ED Provider Notes (Signed)
CSN: [id number]     Arrival date & time 04/03/15  1900 History  This chart was scribed for Charles Circle, PA-C, working with Charles Freiberg, MD by Charles Fox, ED Scribe. This patient was seen in room TR06C/TR06C and the patient's care was started at 7:51 PM.   Chief Complaint  Patient presents with  . Insect Bite   The history is provided by the patient. No language interpreter was used.   HPI Comments: Charles Fox is a 64 y.o. male with a history of DM, HTN who presents to the Emergency Department complaining of a painful, red suspected insect bit on the right upper back onset 7 hours ago.  He has not visualized any insect.  The patient reports a history of DM and is prescribed insulin.  He ran out of his Lantus 40 mg once daily in the morning 3 weeks ago and has been unable to contact his PCP for refill.  He denies fever, vomiting, blurred vision.  He denies other complaints.    Past Medical History  Diagnosis Date  . Diabetes mellitus without complication   . Hypertension    Past Surgical History  Procedure Laterality Date  . Back surgery    . Finger surgery     No family history on file. History  Substance Use Topics  . Smoking status: Current Every Day Smoker -- 1.00 packs/day    Types: Cigarettes  . Smokeless tobacco: Never Used  . Alcohol Use: No    Review of Systems  Constitutional: Negative for fever and chills.  Respiratory: Negative for shortness of breath.   Cardiovascular: Negative for chest pain.  Gastrointestinal: Negative for nausea, vomiting, diarrhea and constipation.  Genitourinary: Negative for dysuria.  Skin: Positive for color change and wound.      Allergies  Review of patient's allergies indicates no known allergies.  Home Medications   Prior to Admission medications   Medication Sig Start Date End Date Taking? Authorizing Provider  aspirin 81 MG tablet Take 1 tablet (81 mg total) by mouth daily. 11/21/14   Patrecia Pour, MD  atorvastatin  (LIPITOR) 80 MG tablet Take 1 tablet (80 mg total) by mouth daily. 12/02/14   Patrecia Pour, MD  Blood Glucose Monitoring Suppl (ONE TOUCH ULTRA 2) W/DEVICE KIT 1 each by Does not apply route once. 02/20/15   Patrecia Pour, MD  glucose blood (ONE TOUCH ULTRA TEST) test strip Check sugar in morning before breakfast 02/20/15   Patrecia Pour, MD  insulin glargine (LANTUS) 100 UNIT/ML injection Inject 0.4 mLs (40 Units total) into the skin daily before breakfast. 02/20/15   Patrecia Pour, MD  Insulin Syringes, Disposable, U-100 0.3 ML MISC As directed 12/23/14   Lutricia Feil, PA  Lancets [facility] ULTRASOFT) lancets Use as instructed 02/20/15   Patrecia Pour, MD  lisinopril (PRINIVIL,ZESTRIL) 10 MG tablet Take 1 tablet (10 mg total) by mouth daily. 12/02/14   Patrecia Pour, MD   BP 145/87 mmHg  Pulse 92  Temp(Src) 98.1 F (36.7 C) (Oral)  Resp 16  SpO2 95% Physical Exam  Constitutional: He is oriented to person, place, and time. He appears well-developed and well-nourished. No distress.  HENT:  Head: Normocephalic and atraumatic.  Eyes: Conjunctivae and EOM are normal.  Neck: Neck supple. No tracheal deviation present.  Cardiovascular: Normal rate, regular rhythm and normal heart sounds.   Pulmonary/Chest: Effort normal and breath sounds normal. No respiratory distress. He has no wheezes.  Musculoskeletal: Normal range of motion.  Neurological: He is alert and oriented to person, place, and time.  Skin: Skin is warm and dry.  1 x 1 cm abscess to right upper back, no surrounding erythema, discharge, or drainage  Psychiatric: He has a normal mood and affect. His behavior is normal.  Nursing note and vitals reviewed.   ED Course  Procedures (including critical care time)  DIAGNOSTIC STUDIES: Oxygen Saturation is 95% on RA, adequate by my interpretation.    COORDINATION OF CARE:  7:55 PM Will provide I&D and refill DM medication.  Patient should f/u with PCP for further refill of his DM  medication.  Patient acknowledges and agrees with plan.   Results for orders placed or performed during the hospital encounter of 04/03/15  CBG monitoring, ED  Result Value Ref Range   Glucose-Capillary 305 (H) 65 - 99 mg/dL   Comment 1 Notify RN   I-Stat Chem 8, ED  Result Value Ref Range   Sodium 134 (L) 135 - 145 mmol/L   Potassium 4.5 3.5 - 5.1 mmol/L   Chloride 99 (L) 101 - 111 mmol/L   BUN 14 6 - 20 mg/dL   Creatinine, Ser 0.80 0.61 - 1.24 mg/dL   Glucose, Bld 324 (H) 65 - 99 mg/dL   Calcium, Ion 1.22 1.13 - 1.30 mmol/L   TCO2 23 0 - 100 mmol/L   Hemoglobin 15.6 13.0 - 17.0 g/dL   HCT 46.0 39.0 - 52.0 %   No results found.    EKG Interpretation None     INCISION AND DRAINAGE Performed by: Charles Fox and Sundra Aland, PA-S Consent: Verbal consent obtained. Risks and benefits: risks, benefits and alternatives were discussed Type: abscess  Body area: back  Anesthesia: local infiltration  Incision was made with a scalpel.  Local anesthetic: lidocaine 2% with epinephrine  Anesthetic total: 2 ml  Complexity: complex Blunt dissection to break up loculations  Drainage: purulent  Drainage amount: mild  Packing material: none  Patient tolerance: Patient tolerated the procedure well with no immediate complications.    MDM   Final diagnoses:  Abscess  Medication refill   Patient with skin abscess amenable to incision and drainage.  Abscess was not large enough to warrant packing or drain,  wound recheck in 2 days. Encouraged home warm soaks and flushing.  Mild signs of cellulitis is surrounding skin.  Will d/c to home.    Patient has been out of his insulin for the past 3 weeks. His CBG is 305. He does not have an anion gap. I've refilled his insulin in the emergency department, and have encouraged him to follow-up with his primary care. Patient understands and agrees with the plan.  I personally performed the services described in this documentation,  which was scribed in my presence. The recorded information has been reviewed and is accurate.     Charles Circle, PA-C 04/03/15 2112  Charles Freiberg, MD 04/04/15 [phone]

## 2015-04-03 NOTE — ED Notes (Signed)
C/o insect bite to R shoulder blade since this morning.  Pt also has not had diabetes medications in 3 weeks because he can't afford them.

## 2015-04-03 NOTE — Discharge Instructions (Signed)

## 2015-04-08 ENCOUNTER — Ambulatory Visit: Payer: Medicare HMO | Admitting: Family Medicine

## 2015-10-17 ENCOUNTER — Other Ambulatory Visit: Payer: Self-pay | Admitting: Family Medicine

## 2015-10-22 ENCOUNTER — Ambulatory Visit (INDEPENDENT_AMBULATORY_CARE_PROVIDER_SITE_OTHER): Payer: PPO | Admitting: Family Medicine

## 2015-10-22 VITALS — BP 150/80 | HR 101 | Temp 98.3°F | Wt 261.2 lb

## 2015-10-22 DIAGNOSIS — B888 Other specified infestations: Secondary | ICD-10-CM | POA: Diagnosis not present

## 2015-10-22 DIAGNOSIS — I10 Essential (primary) hypertension: Secondary | ICD-10-CM

## 2015-10-22 DIAGNOSIS — E119 Type 2 diabetes mellitus without complications: Secondary | ICD-10-CM

## 2015-10-22 DIAGNOSIS — Z794 Long term (current) use of insulin: Secondary | ICD-10-CM

## 2015-10-22 DIAGNOSIS — Z23 Encounter for immunization: Secondary | ICD-10-CM

## 2015-10-22 DIAGNOSIS — F17200 Nicotine dependence, unspecified, uncomplicated: Secondary | ICD-10-CM

## 2015-10-22 MED ORDER — HYDROCORTISONE 2.5 % EX OINT: TOPICAL_OINTMENT | Freq: Two times a day (BID) | CUTANEOUS | Status: DC

## 2015-10-22 MED ORDER — INSULIN GLARGINE 100 UNIT/ML ~~LOC~~ SOLN: SUBCUTANEOUS | Status: DC

## 2015-10-22 MED ORDER — ATORVASTATIN CALCIUM 80 MG PO TABS: 80.0000 mg | ORAL_TABLET | Freq: Every day | ORAL | Status: DC

## 2015-10-22 MED ORDER — LISINOPRIL 10 MG PO TABS: 10.0000 mg | ORAL_TABLET | Freq: Every day | ORAL | Status: DC

## 2015-10-22 MED ORDER — HYDROXYZINE HCL 10 MG PO TABS: 10.0000 mg | ORAL_TABLET | Freq: Three times a day (TID) | ORAL | Status: DC | PRN

## 2015-10-22 MED FILL — LANTUS 100 UNITS/ML VIAL: 100 | 25 days supply | Qty: 10 | Fill #0

## 2015-10-22 MED FILL — ATORVASTATIN 80 MG TABLET: 80 | 90 days supply | Qty: 90 | Fill #0

## 2015-10-22 MED FILL — LISINOPRIL 10 MG TABLET: 10 | 90 days supply | Qty: 90 | Fill #0

## 2015-10-22 MED FILL — hydrOXYzine HCL 10 MG TABS: 10 | 10 days supply | Qty: 30 | Fill #0

## 2015-10-22 MED FILL — HYDROCORTISONE 2.5% OINT: 2.5 | 30 days supply | Qty: 28 | Fill #0

## 2015-10-22 NOTE — Patient Instructions (Signed)
Thank you for coming in today!  - Take all medications as directed.  - Record your blood sugar readings every morning and bring this record with you to follow up in 2 weeks.   - For bed bugs, you need to have a pest control agency come to your home to investigate. Wash all your clothes and sheets on hot.  - Apply hydrocortisone to the areas that itch twice a day as needed.  - Take atarax as needed for itching. This may make you drowsy.   Our clinic's number is [phone]. Feel free to call any time with questions or concerns. We will answer any questions after hours with our 24-hour emergency line at that number as well.   - Dr. Bonner Puna

## 2015-10-22 NOTE — Progress Notes (Signed)
Subjective: Charles Fox is a 65 y.o. male walk-in for IDDM follow up. He has been out of insulin for weeks. He was last seen in June 2016.  He usually takes 40 units of lantus in the morning, reporting good compliance, though he has taken none for the past 1 - 2 weeks (he isn't sure how long he's been out). He checks his sugars 0 - 1 time per day. He cannot recall ANY of the readings. Also out of lipitor and did not take lisinopril today.  He also endorses 2 weeks of generalized itching from bug bites, though he doesn't know where they're coming from. Bites are on lower legs, arms and hands, a couple on his face. No new detergents, travel, outdoor activities. His wife developed similar symptoms at the same time. The treat their pets for fleas regularly. They have seen some red/brown dots on their bedding recently.  - ROS: Denies fever, chills, weight loss, dizziness, vision changes, syncope, polyuria, nocturia, numbness, polydipsia, chest pain, new wounds. No abdominal pain, N/V/D. - PMFSH: 1 ppd smoker, occasional EtOH, no illicit drugs. - Medications: reviewed and updated  Objective: BP 150/80 mmHg  Pulse 101  Temp(Src) 98.3 F (36.8 C) (Oral)  Wt 261 lb 3.2 oz (118.48 kg)  SpO2 97% Gen: Obese, well-appearing 64 y.o.male in no distress HEENT: Normocephalic, sclerae/conjunctivae clear, PERRL, MMM, posterior oropharynx clear, poor dentition Neck: Neck supple, no masses or lymphadenopathy; thyroid not enlarged  Pulm: Non-labored; CTAB, no wheezes  CV: Regular rate, no murmur appreciated; no LE edema, no JVD GI: Normoactive BS; soft, non-tender, non-distended, no HSM Skin: Eruptions of small, erythematous, nondescript papules, some excoriated on forearms, lower legs, neck, and abdomen. No lesions on wrists, axillae, areolae. No burrows noted in interdigital spaces.  See diabetic foot exam - simple  Neuro: CN II-XII without deficits, sensation intact to light touch, steady  gait.  Assessment & Plan: Charles Fox is a 65 y.o. male here for diabetes, poorly controlled.    Diabetes mellitus Hb A1c not checked today due to lab being closed. No changes to insulin made due to this and no CBG recordings. Denies symptoms of high or low blood sugar, though he has never had good control. Will refill insulin (been out for 2 weeks!) and emphasize need for regular compliance and CBG checks/recording BID. Follow up 2 weeks. Urged to call pharmacy before he needs refills so there is no lapse in treatment.  - Continue ASA, ACE, statin.  - Will need Hb A1c, lipid panel at follow up. - Address need for eye exam at follow up.   Tobacco use disorder - Brief smoking cessation counseling provided today. Pre-contemplative.   HTN Above goal, though has not taken BP medication. Urged to take BP meds everyday, even when coming to the doctor's office. Not in severe range and no symptoms.   Bed bugs: Topical low potency steroid for symptoms, urged him to wash sheets on hot and contact pest control for definitive management.

## 2015-10-28 ENCOUNTER — Encounter: Payer: Self-pay | Admitting: Family Medicine

## 2015-10-28 NOTE — Assessment & Plan Note (Signed)
Above goal, though has not taken BP medication. Urged to take BP meds everyday, even when coming to the doctor's office. Not in severe range and no symptoms.

## 2015-10-28 NOTE — Assessment & Plan Note (Signed)
-   Brief smoking cessation counseling provided today. Pre-contemplative.

## 2015-10-28 NOTE — Assessment & Plan Note (Signed)
Hb A1c not checked today due to lab being closed. No changes to insulin made due to this and no CBG recordings. Denies symptoms of high or low blood sugar, though he has never had good control. Will refill insulin (been out for 2 weeks!) and emphasize need for regular compliance and CBG checks/recording BID. Follow up 2 weeks. Urged to call pharmacy before he needs refills so there is no lapse in treatment.  - Continue ASA, ACE, statin.  - Will need Hb A1c, lipid panel at follow up. - Address need for eye exam at follow up.

## 2015-11-24 MED FILL — LANTUS 100 UNITS/ML VIAL: 100 | 25 days supply | Qty: 10 | Fill #1

## 2015-12-09 ENCOUNTER — Emergency Department (INDEPENDENT_AMBULATORY_CARE_PROVIDER_SITE_OTHER)
Admission: EM | Admit: 2015-12-09 | Discharge: 2015-12-09 | Disposition: A | Discharge status: Home / Self Care | Payer: PPO | Source: Home / Self Care | Attending: Emergency Medicine | Admitting: Emergency Medicine

## 2015-12-09 ENCOUNTER — Encounter (HOSPITAL_COMMUNITY): Payer: Self-pay | Admitting: *Deleted

## 2015-12-09 DIAGNOSIS — L03012 Cellulitis of left finger: Secondary | ICD-10-CM

## 2015-12-09 MED ORDER — CEPHALEXIN 500 MG PO CAPS: 500.0000 mg | ORAL_CAPSULE | Freq: Three times a day (TID) | ORAL | Status: DC

## 2015-12-09 NOTE — ED Notes (Signed)
Pt  Has   Swollen painfull  l  Middle  Finger     Symptoms  Began  yest  The  Finger throbs

## 2015-12-09 NOTE — ED Provider Notes (Signed)
CSN: [id number]     Arrival date & time 12/09/15  1543 History   First MD Initiated Contact with Patient 12/09/15 1729     Chief Complaint  Patient presents with  . Hand Pain   (Consider location/radiation/quality/duration/timing/severity/associated sxs/prior Treatment) HPI History obtained from patient:   LOCATION: left long finger SEVERITY:4 DURATION:almost 1 week CONTEXT:unsure how infection started, denies biting nails.  QUALITY: MODIFYING FACTORS:epsom hot water soaks ASSOCIATED SYMPTOMS:pain and swelling TIMING:constant OCCUPATION:  Past Medical History  Diagnosis Date  . Diabetes mellitus without complication ([place])   . Hypertension    Past Surgical History  Procedure Laterality Date  . Back surgery    . Finger surgery     No family history on file. Social History  Substance Use Topics  . Smoking status: Current Every Day Smoker -- 1.00 packs/day    Types: Cigarettes  . Smokeless tobacco: Never Used  . Alcohol Use: No    Review of Systems  Allergies  Review of patient's allergies indicates no known allergies.  Home Medications   Prior to Admission medications   Medication Sig Start Date End Date Taking? Authorizing Provider  aspirin 81 MG tablet Take 1 tablet (81 mg total) by mouth daily. 11/21/14   Patrecia Pour, MD  atorvastatin (LIPITOR) 80 MG tablet Take 1 tablet (80 mg total) by mouth daily. 10/22/15   Patrecia Pour, MD  Blood Glucose Monitoring Suppl (ONE TOUCH ULTRA 2) W/DEVICE KIT 1 each by Does not apply route once. 02/20/15   Patrecia Pour, MD  glucose blood (ONE TOUCH ULTRA TEST) test strip Check sugar in morning before breakfast 02/20/15   Patrecia Pour, MD  hydrocortisone 2.5 % ointment Apply topically 2 (two) times daily. 10/22/15   Patrecia Pour, MD  hydrOXYzine (ATARAX/VISTARIL) 10 MG tablet Take 1 tablet (10 mg total) by mouth 3 (three) times daily as needed. 10/22/15   Patrecia Pour, MD  insulin glargine (LANTUS) 100 UNIT/ML injection INJECT 40 UNITS INTO  THE SKIN DAILY BEFORE BREAKFAST 10/22/15   Patrecia Pour, MD  Insulin Syringes, Disposable, U-100 0.3 ML MISC As directed 12/23/14   Lutricia Feil, PA  Lancets [facility] ULTRASOFT) lancets Use as instructed 02/20/15   Patrecia Pour, MD  lisinopril (PRINIVIL,ZESTRIL) 10 MG tablet Take 1 tablet (10 mg total) by mouth daily. 10/22/15   Patrecia Pour, MD   Meds Ordered and Administered this Visit  Medications - No data to display  BP 161/96 mmHg  Pulse 90  Temp(Src) 98.1 F (36.7 C) (Oral)  Resp 20  SpO2 95% No data found.   Physical Exam  Constitutional: He appears well-developed and well-nourished.  Musculoskeletal:       Hands: Skin: Skin is warm and dry.  Nursing note and vitals reviewed.   ED Course  .Marland KitchenIncision and Drainage Date/Time: 12/09/2015 6:40 PM Performed by: Konrad Felix Authorized by: Melony Overly Consent: Verbal consent obtained. Consent given by: patient Patient identity confirmed: verbally with patient and arm band Time out: Immediately prior to procedure a "time out" was called to verify the correct patient, procedure, equipment, support staff and site/side marked as required. Type: abscess Body area: upper extremity Location details: left long finger Local anesthetic: co-phenylcaine spray Patient sedated: no Scalpel size: 11 Incision type: single straight Complexity: simple Drainage: purulent Drainage amount: copious Wound treatment: wound left open Patient tolerance: Patient tolerated the procedure well with no immediate complications   (including critical care time)  Labs Review Labs Reviewed - No data to display  Imaging Review No results found.   Visual Acuity Review  Right Eye Distance:   Left Eye Distance:   Bilateral Distance:    Right Eye Near:   Left Eye Near:    Bilateral Near:       I&D of paronychia Keflex antibx  Symptomatic care  MDM   1. Paronychia of finger of left hand      Patient is reassured that there  are no issues that require transfer to higher level of care at this time or additional tests. Patient is advised to continue home symptomatic treatment. Patient is advised that if there are new or worsening symptoms to attend the emergency department, contact primary care provider, or return to UC. Instructions of care provided discharged home in stable condition. Return to work/school note provided.   THIS NOTE WAS GENERATED USING A VOICE RECOGNITION SOFTWARE PROGRAM. ALL REASONABLE EFFORTS  WERE MADE TO PROOFREAD THIS DOCUMENT FOR ACCURACY.  I have verbally reviewed the discharge instructions with the patient. A printed AVS was given to the patient.  All questions were answered prior to discharge.       Konrad Felix, PA 12/09/15 [place], [place] 12/12/15 1048

## 2015-12-09 NOTE — Discharge Instructions (Signed)
Paronychia  °Paronychia is an infection of the skin. It happens near a fingernail or toenail. It may cause pain and swelling around the nail. Usually, it is not serious and it clears up with treatment. °HOME CARE °· Soak the fingers or toes in warm water as told by your doctor. You may be told to do this for 20 minutes, 2-3 times a day. °· Keep the area dry when you are not soaking it. °· Take medicines only as told by your doctor. °· If you were given an antibiotic medicine, finish all of it even if you start to feel better. °· Keep the affected area clean. °· Do not try to drain a fluid-filled bump yourself. °· Wear rubber gloves when putting your hands in water. °· Wear gloves if your hands might touch cleaners or chemicals. °· Follow your doctor's instructions about: °¨ Wound care. °¨ Bandage (dressing) changes and removal. °GET HELP IF: °· Your symptoms get worse or do not improve. °· You have a fever or chills. °· You have redness spreading from the affected area. °· You have more fluid, blood, or pus coming from the affected area. °· Your finger or knuckle is swollen or is hard to move. °  °This information is not intended to replace advice given to you by your health care provider. Make sure you discuss any questions you have with your health care provider. °  °Document Released: 08/24/2009 Document Revised: 01/20/2015 Document Reviewed: 08/13/2014 °Elsevier Interactive Patient Education ©2016 [employer] ° °

## 2015-12-23 ENCOUNTER — Telehealth: Payer: Self-pay | Admitting: Family Medicine

## 2015-12-23 DIAGNOSIS — Z794 Long term (current) use of insulin: Principal | ICD-10-CM

## 2015-12-23 DIAGNOSIS — E119 Type 2 diabetes mellitus without complications: Secondary | ICD-10-CM

## 2015-12-23 MED ORDER — INSULIN GLARGINE 100 UNIT/ML ~~LOC~~ SOLN: SUBCUTANEOUS | Status: DC

## 2015-12-23 MED FILL — LANTUS 100 UNITS/ML VIAL: 100 | 25 days supply | Qty: 10 | Fill #0

## 2015-12-23 NOTE — Telephone Encounter (Signed)
Pt called and needs a refill on his Lantus sent in to his new pharmacy. Outpatient [facility]. jw

## 2016-01-20 MED FILL — ATORVASTATIN 80 MG TABLET: 80 | 90 days supply | Qty: 90 | Fill #1

## 2016-02-17 MED FILL — LANTUS 100 UNITS/ML VIAL: 100 | 25 days supply | Qty: 10 | Fill #1

## 2016-03-18 ENCOUNTER — Ambulatory Visit (INDEPENDENT_AMBULATORY_CARE_PROVIDER_SITE_OTHER): Payer: PPO | Admitting: Family Medicine

## 2016-03-18 ENCOUNTER — Encounter: Payer: Self-pay | Admitting: Family Medicine

## 2016-03-18 VITALS — BP 159/73 | HR 90 | Temp 98.3°F | Ht 69.0 in | Wt 270.2 lb

## 2016-03-18 DIAGNOSIS — F17200 Nicotine dependence, unspecified, uncomplicated: Secondary | ICD-10-CM | POA: Diagnosis not present

## 2016-03-18 DIAGNOSIS — E119 Type 2 diabetes mellitus without complications: Secondary | ICD-10-CM | POA: Diagnosis not present

## 2016-03-18 DIAGNOSIS — I10 Essential (primary) hypertension: Secondary | ICD-10-CM

## 2016-03-18 DIAGNOSIS — IMO0001 Reserved for inherently not codable concepts without codable children: Secondary | ICD-10-CM

## 2016-03-18 DIAGNOSIS — Z794 Long term (current) use of insulin: Secondary | ICD-10-CM | POA: Diagnosis not present

## 2016-03-18 LAB — POCT GLYCOSYLATED HEMOGLOBIN (HGB A1C): HEMOGLOBIN A1C: 11.4

## 2016-03-18 MED ORDER — GLUCOSE BLOOD VI STRP: ORAL_STRIP | Status: DC

## 2016-03-18 MED ORDER — LISINOPRIL 10 MG PO TABS: 10.0000 mg | ORAL_TABLET | Freq: Every day | ORAL | Status: DC

## 2016-03-18 MED ORDER — INSULIN GLARGINE 100 UNIT/ML ~~LOC~~ SOLN: SUBCUTANEOUS | Status: DC

## 2016-03-18 MED FILL — LISINOPRIL 10 MG TABLET: 10 | 90 days supply | Qty: 90 | Fill #0

## 2016-03-18 MED FILL — LANTUS SOLOSTAR 100 UNITS/M: 100 | 30 days supply | Qty: 15 | Fill #0

## 2016-03-18 NOTE — Patient Instructions (Addendum)
- Please RESTART lisinopril. You can call the pharmacy for a refill when your medication is close to running out. A year worth of refills has been sent in. - Please check your blood sugars twice a day and record these and bring these records to your next office visit with Dr. [name].  - SCHEDULE and appointment with Dr. [name] to discuss diabetes on your way out today.  - SCHEDULE a follow up in 2 weeks to recheck your blood pressure; this will be with your new doctor.  - START taking 10 units of lantus insulin in the evenings as well (in addition to 40 units in the morning).   - High blood pressure is best treated by losing weight, taking medications as directed, avoiding salt in your diet and increasing potassium from fruits and vegetables (called the DASH diet). - Please follow up in 2 weeks for a nurse visit to recheck your blood pressure, and we should have another appointment in about 1 month. Schedule these on the way out today. If you feel faint, experience new/worsening chest pain or shortness of breath, or notice rapid leg swelling and/or weight gain you should call the clinic at [phone] OR go directly to the ER.   Take care,  - Dr. Bonner Puna  DASH Eating Plan DASH stands for "Dietary Approaches to Stop Hypertension." The DASH eating plan is a healthy eating plan that has been shown to reduce high blood pressure (hypertension). Additional health benefits may include reducing the risk of type 2 diabetes mellitus, heart disease, and stroke. The DASH eating plan may also help with weight loss. WHAT DO I NEED TO KNOW ABOUT THE DASH EATING PLAN? For the DASH eating plan, you will follow these general guidelines:  Choose foods with a percent daily value for sodium of less than 5% (as listed on the food label).  Use salt-free seasonings or herbs instead of table salt or sea salt.  Check with your health care provider or pharmacist before using salt substitutes.  Eat lower-sodium products,  often labeled as "lower sodium" or "no salt added."  Eat fresh foods.  Eat more vegetables, fruits, and low-fat dairy products.  Choose whole grains. Look for the word "whole" as the first word in the ingredient list.  Choose fish and skinless chicken or [place] more often than red meat. Limit fish, poultry, and meat to 6 oz (170 g) each day.  Limit sweets, desserts, sugars, and sugary drinks.  Choose heart-healthy fats.  Limit cheese to 1 oz (28 g) per day.  Eat more home-cooked food and less restaurant, buffet, and fast food.  Limit fried foods.  Cook foods using methods other than frying.  Limit canned vegetables. If you do use them, rinse them well to decrease the sodium.  When eating at a restaurant, ask that your food be prepared with less salt, or no salt if possible. WHAT FOODS CAN I EAT? Seek help from a dietitian for individual calorie needs. Grains Whole grain or whole wheat bread. Brown rice. Whole grain or whole wheat pasta. Quinoa, bulgur, and whole grain cereals. Low-sodium cereals. Corn or whole wheat flour tortillas. Whole grain cornbread. Whole grain crackers. Low-sodium crackers. Vegetables Fresh or frozen vegetables (raw, steamed, roasted, or grilled). Low-sodium or reduced-sodium tomato and vegetable juices. Low-sodium or reduced-sodium tomato sauce and paste. Low-sodium or reduced-sodium canned vegetables.  Fruits All fresh, canned (in natural juice), or frozen fruits. Meat and Other Protein Products Ground beef (85% or leaner), grass-fed beef, or beef  trimmed of fat. Skinless chicken or [place]. Ground chicken or [place]. Pork trimmed of fat. All fish and seafood. Eggs. Dried beans, peas, or lentils. Unsalted nuts and seeds. Unsalted canned beans. Dairy Low-fat dairy products, such as skim or 1% milk, 2% or reduced-fat cheeses, low-fat ricotta or cottage cheese, or plain low-fat yogurt. Low-sodium or reduced-sodium cheeses. Fats and Oils Tub margarines  without trans fats. Light or reduced-fat mayonnaise and salad dressings (reduced sodium). Avocado. Safflower, olive, or canola oils. Natural peanut or almond butter. Other Unsalted popcorn and pretzels.  WHAT FOODS ARE NOT RECOMMENDED? Grains White bread. White pasta. White rice. Refined cornbread. Bagels and croissants. Crackers that contain trans fat. Vegetables Creamed or fried vegetables. Vegetables in a cheese sauce. Regular canned vegetables. Regular canned tomato sauce and paste. Regular tomato and vegetable juices. Fruits Dried fruits. Canned fruit in light or heavy syrup. Fruit juice. Meat and Other Protein Products Fatty cuts of meat. Ribs, chicken wings, bacon, sausage, bologna, salami, chitterlings, fatback, hot dogs, bratwurst, and packaged luncheon meats. Salted nuts and seeds. Canned beans with salt. Dairy Whole or 2% milk, cream, half-and-half, and cream cheese. Whole-fat or sweetened yogurt. Full-fat cheeses or blue cheese. Nondairy creamers and whipped toppings. Processed cheese, cheese spreads, or cheese curds. Condiments Onion and garlic salt, seasoned salt, table salt, and sea salt. Canned and packaged gravies. Worcestershire sauce. Tartar sauce. Barbecue sauce. Teriyaki sauce. Soy sauce, including reduced sodium. Steak sauce. Fish sauce. Oyster sauce. Cocktail sauce. Horseradish. Ketchup and mustard. Meat flavorings and tenderizers. Bouillon cubes. Hot sauce. Tabasco sauce. Marinades. Taco seasonings. Relishes. Fats and Oils Butter, stick margarine, lard, shortening, ghee, and bacon fat. Coconut, palm kernel, or palm oils. Regular salad dressings. Other Pickles and olives. Salted popcorn and pretzels. The items listed above may not be a complete list of foods and beverages to avoid. Contact your dietitian for more information. WHERE CAN I FIND MORE INFORMATION? National Heart, [employer], and [employer] Institute: travelstabloid.com

## 2016-03-18 NOTE — Progress Notes (Signed)
Subjective: Charles Fox is a 65 y.o. male presenting for IDDM follow up.   He usually takes 40 units of lantus in the morning, reporting good compliance, and has run out of test strips so he doesn't check his CBGs at all. Doesn't check BP at home, but has been out of lisinopril for the past several weeks. He's not exercising, but adheres to a low salt diet. Denies dyspnea on exertion. He has gained some weight and feels self-conscious about that, says it's time to start walking more.   - ROS: Denies fever, chills, weight loss, dizziness, vision changes, syncope, dyspnea, palpitations, polyuria, nocturia, numbness, polydipsia, chest pain, new wounds. No abdominal pain, N/V/D. - PMFSH: 1 ppd smoker (pre-contemplative because he's worried about weight gain with cessation), occasional EtOH, no illicit drugs. - Medications: reviewed and updated  Objective: BP 159/73 mmHg  Pulse 90  Temp(Src) 98.3 F (36.8 C) (Oral)  Ht 5\' 9"  (1.753 m)  Wt 270 lb 3.2 oz (122.562 kg)  BMI 39.88 kg/m2 Gen: Obese, well-appearing 64 y.o.male in no distress HEENT: Normocephalic, sclerae/conjunctivae clear, PERRL, MMM, posterior oropharynx clear, poor dentition Neck: Neck supple, no masses or lymphadenopathy; thyroid not enlarged  Pulm: Non-labored; CTAB, no wheezes  CV: Regular rate, no murmur appreciated; no LE edema, no JVD GI: Normoactive BS; soft, non-tender, non-distended, no HSM See diabetic foot exam - simple  Neuro: CN II-XII without deficits, sensation intact to light touch, steady gait.  Assessment & Plan: Charles Fox is a 65 y.o. male here for insulin-dependent diabetes, poorly controlled.    Diabetes mellitus Very poorly controlled at Hb A1c stable at 11.4% due to inadequate insulin dose, poor lifestyle choices, and poor medical literacy. Pt would certainly benefit from in-depth discussion with pharmacy clinic - Schedule visit with pharmacy clinic for disease management discussion and  reinforcement of role of insulin - Start 10 extra units of lantus in PM (in addition to current 40 units in AM).  - Check CBG BID (test strips reordered): to record these values and bring them to follow up. - Continue ASA, statin daily, restart ACE - Check lipids, BMP today - Eye exam is not up to date: reordered today.   Tobacco use disorder Remains precontemplative, as he is afraid of weight gain with cessation. The reality of mortality and morbidity benefit as well as a few of the possible pharmacotherapy options for smoking cessation were reviewed. Poor health literacy remains problematic for this problem. 1800-quit line provided.   HTN Uncontrolled due to nonadherence. Not severe or symptomatic.  - Restart lisinopril (pt had 1 year of lisinopril sent to pharmacy at last visit). He was advised to call the pharmacy for refills when he runs out of medications.  - Check renal function - Primary elements of DASH diet and weight loss strategies reviewed in detail. Handout given.  - Follow up in 2 weeks week for BP and labs check

## 2016-03-18 NOTE — Assessment & Plan Note (Signed)
Remains precontemplative, as he is afraid of weight gain with cessation. The reality of mortality and morbidity benefit as well as a few of the possible pharmacotherapy options for smoking cessation were reviewed. Poor health literacy remains problematic for this problem. 1800-quit line provided.

## 2016-03-18 NOTE — Addendum Note (Signed)
Addended by: Vance Gather B on: 03/18/2016 03:28 PM   Modules accepted: Orders

## 2016-03-18 NOTE — Assessment & Plan Note (Signed)
Uncontrolled due to nonadherence. Not severe or symptomatic.  - Restart lisinopril (pt had 1 year of lisinopril sent to pharmacy at last visit). He was advised to call the pharmacy for refills when he runs out of medications.  - Check renal function - Primary elements of DASH diet and weight loss strategies reviewed in detail. Handout given.  - Follow up in 2 weeks week for BP and labs check

## 2016-03-18 NOTE — Assessment & Plan Note (Signed)
Very poorly controlled at Hb A1c stable at 11.4% due to inadequate insulin dose, poor lifestyle choices, and poor medical literacy. Pt would certainly benefit from in-depth discussion with pharmacy clinic - Schedule visit with pharmacy clinic for disease management discussion and reinforcement of role of insulin - Start 10 extra units of lantus in PM (in addition to current 40 units in AM).  - Check CBG BID (test strips reordered): to record these values and bring them to follow up. - Continue ASA, statin daily, restart ACE - Check lipids, BMP today - Eye exam is not up to date: reordered today.

## 2016-03-19 LAB — LIPID PANEL
Cholesterol: 137 mg/dL (ref 125–200)
HDL: 49 mg/dL (ref >=40)
LDL CALC: 69 mg/dL (ref <130)
Total CHOL/HDL Ratio: 2.8 Ratio (ref <=5.0)
Triglycerides: 96 mg/dL (ref <150)
VLDL: 19 mg/dL (ref <30)

## 2016-03-19 LAB — BASIC METABOLIC PANEL WITH GFR
BUN: 15 mg/dL (ref 7–25)
CO2: 22 mmol/L (ref 20–31)
CREATININE: 0.81 mg/dL (ref 0.70–1.25)
Calcium: 9.4 mg/dL (ref 8.6–10.3)
Chloride: 102 mmol/L (ref 98–110)
GFR, Est African American: >89 mL/min (ref >=60)
Glucose, Bld: 311 mg/dL — ABNORMAL HIGH (ref 65–99)
Potassium: 4.3 mmol/L (ref 3.5–5.3)
SODIUM: 135 mmol/L (ref 135–146)

## 2016-03-24 ENCOUNTER — Ambulatory Visit: Payer: PPO | Admitting: Pharmacist

## 2016-03-25 ENCOUNTER — Ambulatory Visit: Payer: PPO | Admitting: Student

## 2016-04-01 ENCOUNTER — Ambulatory Visit: Payer: PPO | Admitting: Student

## 2016-04-20 MED FILL — LANTUS SOLOSTAR 100 UNITS/M: 100 | 30 days supply | Qty: 15 | Fill #1

## 2016-04-25 MED FILL — ATORVASTATIN 80 MG TABLET: 80 | 90 days supply | Qty: 90 | Fill #2

## 2016-05-24 ENCOUNTER — Other Ambulatory Visit: Payer: Self-pay | Admitting: *Deleted

## 2016-05-24 ENCOUNTER — Other Ambulatory Visit: Payer: Self-pay | Admitting: Student

## 2016-05-24 DIAGNOSIS — Z794 Long term (current) use of insulin: Principal | ICD-10-CM

## 2016-05-24 DIAGNOSIS — E119 Type 2 diabetes mellitus without complications: Secondary | ICD-10-CM

## 2016-05-24 MED ORDER — INSULIN GLARGINE 100 UNIT/ML ~~LOC~~ SOLN: SUBCUTANEOUS | 0 refills | Status: DC

## 2016-05-24 MED ORDER — LANTUS SOLOSTAR 100 UNIT/ML ~~LOC~~ SOPN: PEN_INJECTOR | SUBCUTANEOUS | 0 refills | Status: DC

## 2016-05-24 MED FILL — LANTUS SOLOSTAR 100 UNITS/M: 100 | 30 days supply | Qty: 15 | Fill #0

## 2016-05-24 NOTE — Telephone Encounter (Signed)
Reconciled his med and sent one refill on his Lantus Solstar to [place]. Please, let him know. Thank you!

## 2016-05-24 NOTE — Telephone Encounter (Signed)
Patient walked into nurse clinic this afternoon requesting a refill on Lantus SoloStar Pen.  Patient is out of medication.  He uses 40 Units in the AM and 10 Units PM.  Lantus SoloStar is not on current med list.  Pt scheduled appointment for 05/26/16 at 3:15 for follow up.  Derl Barrow, RN

## 2016-05-25 DIAGNOSIS — E113213 Type 2 diabetes mellitus with mild nonproliferative diabetic retinopathy with macular edema, bilateral: Secondary | ICD-10-CM | POA: Diagnosis not present

## 2016-05-25 NOTE — Telephone Encounter (Signed)
Contacted pt and informed him of below. Katharina Caper, Bret Stamour D, [place]

## 2016-05-26 ENCOUNTER — Ambulatory Visit (INDEPENDENT_AMBULATORY_CARE_PROVIDER_SITE_OTHER): Payer: PPO | Admitting: Student

## 2016-05-26 VITALS — BP 136/69 | HR 87 | Temp 98.6°F | Ht 69.0 in | Wt 269.0 lb

## 2016-05-26 DIAGNOSIS — Z794 Long term (current) use of insulin: Secondary | ICD-10-CM | POA: Diagnosis not present

## 2016-05-26 DIAGNOSIS — E119 Type 2 diabetes mellitus without complications: Secondary | ICD-10-CM | POA: Diagnosis not present

## 2016-05-26 NOTE — Progress Notes (Signed)
Patient arrived about an hour early. He could not wait to be seen and left. He also presented to RN office at our clinic for refill on his Lantus that I refilled.

## 2016-05-27 ENCOUNTER — Encounter: Payer: Self-pay | Admitting: Student

## 2016-05-27 DIAGNOSIS — R809 Proteinuria, unspecified: Secondary | ICD-10-CM | POA: Insufficient documentation

## 2016-05-27 LAB — MICROALBUMIN, URINE: Microalb, Ur: 34.4 mg/dL

## 2016-06-02 ENCOUNTER — Ambulatory Visit: Payer: PPO | Admitting: Student

## 2016-06-22 ENCOUNTER — Ambulatory Visit: Payer: PPO | Admitting: Student

## 2016-06-29 ENCOUNTER — Ambulatory Visit (INDEPENDENT_AMBULATORY_CARE_PROVIDER_SITE_OTHER): Payer: PPO | Admitting: Student

## 2016-06-29 ENCOUNTER — Telehealth: Payer: Self-pay | Admitting: *Deleted

## 2016-06-29 VITALS — BP 150/64 | HR 97 | Temp 98.7°F | Ht 69.0 in | Wt 269.4 lb

## 2016-06-29 DIAGNOSIS — Z794 Long term (current) use of insulin: Secondary | ICD-10-CM

## 2016-06-29 DIAGNOSIS — F17200 Nicotine dependence, unspecified, uncomplicated: Secondary | ICD-10-CM

## 2016-06-29 DIAGNOSIS — Z23 Encounter for immunization: Secondary | ICD-10-CM

## 2016-06-29 DIAGNOSIS — IMO0001 Reserved for inherently not codable concepts without codable children: Secondary | ICD-10-CM

## 2016-06-29 DIAGNOSIS — E119 Type 2 diabetes mellitus without complications: Secondary | ICD-10-CM

## 2016-06-29 DIAGNOSIS — I10 Essential (primary) hypertension: Secondary | ICD-10-CM

## 2016-06-29 LAB — POCT GLYCOSYLATED HEMOGLOBIN (HGB A1C): Hemoglobin A1C: 9.2

## 2016-06-29 MED ORDER — LANTUS SOLOSTAR 100 UNIT/ML ~~LOC~~ SOPN
40.0000 [IU] | PEN_INJECTOR | Freq: Every day | SUBCUTANEOUS | 0 refills | Status: DC
Start: 2016-06-29 — End: 2016-06-29

## 2016-06-29 MED ORDER — LISINOPRIL 20 MG PO TABS: 20.0000 mg | ORAL_TABLET | Freq: Every day | ORAL | 0 refills | Status: DC

## 2016-06-29 MED ORDER — LANTUS SOLOSTAR 100 UNIT/ML ~~LOC~~ SOPN
40.0000 [IU] | PEN_INJECTOR | Freq: Every day | SUBCUTANEOUS | 0 refills | Status: DC
Start: 2016-06-29 — End: 2016-12-22

## 2016-06-29 MED ORDER — METFORMIN HCL 500 MG PO TABS: 500.0000 mg | ORAL_TABLET | Freq: Two times a day (BID) | ORAL | 3 refills | Status: DC

## 2016-06-29 MED FILL — LANTUS SOLOSTAR 100 UNITS/M: 100 | 30 days supply | Qty: 12 | Fill #0

## 2016-06-29 NOTE — Telephone Encounter (Signed)
[place] called needing clarification on patient's Lantus SoloStar. It has two different directions. Please call [phone].  Derl Barrow, RN

## 2016-06-29 NOTE — Assessment & Plan Note (Signed)
Blood pressure is mildly elevated today. Increased his lisinopril from 10-20 mg daily. BMP today. We'll repeat BMP in 2 weeks when he returns for follow-up

## 2016-06-29 NOTE — Assessment & Plan Note (Addendum)
Improved. A1c down from 11.4 to 9.2. Unfortunately couldn't afford his Lantus and he has been injecting half of the dose he was supposed to use. Sent a prescription for Lantus to [place] [place] to help with co-pay through the indigent fund this time. Decreased Lantus to 40 units at bedtime as we resume Metformin at 500 mg twice a day. He was on 40 units in the morning and 10 units in the evening in the past. He injected only 20 units this morning. Advised him to inject 20 units tonight and start 40 units tomorrow. Advised patient to check his blood glucose in the morning before breakfast. Gave him the logbook to record the number and bring it to the next visit. Advised him to call us if his blood glucose is less than 80. Unfortunately patient did not get his pneumonia vaccine and TdaP today. We will address this when she returns for follow-up on his blood pressure. He already had a foot exam 4 months ago. He is on Ace inhibitors for renal protection. I have increased his lisinopril from 10-20 mg. I also asked him to call his eye doctor to send [place] the eye exam result.  We'll repeat BMP in 2 weeks.

## 2016-06-29 NOTE — Patient Instructions (Addendum)
It was great seeing you today! We have addressed the following issues today  1. Diabetes: A1c has come down to 9.2 from 11.4. Change in the Lantus to 40 units daily at bedtime.  I have also started metformin at 500 mg twice a day. Check your blood sugar when you wake up in the morning before breakfast and write them and bring to next visit. Let us know if your blood sugar is less than 80.  2. Blood pressure: Your blood pressure is 150/64. Your goal blood pressure is less than 140/90. I have increased the lisinopril to 20 mg daily. Please come back and see [place] in 2 weeks.    If we did any lab work today, and the results require attention, either me or my nurse will get in touch with you. If everything is normal, you will get a letter in mail. If you don't hear from [place] in two weeks, please give [place] a call. Otherwise, I look forward to talking with you again at our next visit. If you have any questions or concerns before then, please call the clinic at [phone].  Please bring all your medications to every doctors visit   Sign up for My Chart to have easy access to your labs results, and communication with your Primary care physician.    Please check-out at the front desk before leaving the clinic.   Take Care,

## 2016-06-29 NOTE — Progress Notes (Signed)
   Subjective:    Patient ID: Charles Fox is a 65 y.o. old male.  HPI #Diabetes: Reports using 20 units of Lantus in the morning and 10 units in the evening recently because he was about to run out of his Lantus and a he has no money to pay the co-pay. He was supposed to be on Lantus 40 units in the morning and 10 units in the evening. He is not on metformin. He says he was taken of this medication when he had a procedure in the hospital. He denies problem with his metformin in the past. Doesn't check his blood sugar. Wakes up at night for urine once. Denies symptoms of hypoglycemia. Denies dry mouth, polydipsia, polyphagia or vision changes. He says he has been to ophthalmology last month. Hasn't been to dentist. Walks 10 blocks everyday. He has stopped drinking soft drinks.  #Hypertension: Reports compliance with his lisinopril 10 mg daily. Doesn't check his blood pressure at home.   PMH: reviewed  SH: 1-3 cigarettes a day.   Review of Systems Per HPI Objective:   Vitals:   06/29/16 1028  BP: (!) 150/64  Pulse: 97  Temp: 98.7 F (37.1 C)  TempSrc: Oral  SpO2: 97%  Weight: 269 lb 6.4 oz (122.2 kg)  Height: 5\' 9"  (1.753 m)    GEN: appears obese, no apparent distress. CVS: RRR, normal s1 and s2, no murmurs, no edema RESP: no increased work of breathing NEURO: alert and oriented appropriately, no gross defecits  PSYCH: appropriate mood and affect     Assessment & Plan:  Diabetes mellitus Improved. A1c down from 11.4 to 9.2. Unfortunately couldn't afford his Lantus and he has been injecting half of the dose he was supposed to use. Sent a prescription for Lantus to [place]. [name] to help with co-pay through the indigent fund this time. Decreased Lantus to 40 units at bedtime as we resume Metformin at 500 mg twice a day. He was on 40 units in the morning and 10 units in the evening in the past. He injected only 20 units this morning. Advised him to inject 20 units  tonight and start 40 units tomorrow. Advised patient to check his blood glucose in the morning before breakfast. Gave him the logbook to record the number and bring it to the next visit. Advised him to call us if his blood glucose is less than 80. Unfortunately patient did not get his pneumonia vaccine and TdaP today. We will address this when she returns for follow-up on his blood pressure. He already had a foot exam 4 months ago. He is on Ace inhibitors for renal protection. I have increased his lisinopril from 10-20 mg. I also asked him to call his eye doctor to send [place] the eye exam result.  We'll repeat BMP in 2 weeks.  HTN Blood pressure is mildly elevated today. Increased his lisinopril from 10-20 mg daily. BMP today. We'll repeat BMP in 2 weeks when he returns for follow-up  Tobacco use disorder I advised him to quit smoking.

## 2016-06-29 NOTE — Assessment & Plan Note (Signed)
I advised him to quit smoking.  

## 2016-06-30 DIAGNOSIS — Z23 Encounter for immunization: Secondary | ICD-10-CM | POA: Diagnosis not present

## 2016-06-30 NOTE — Telephone Encounter (Signed)
Talked to patient and clarified. Lantus 40 units at bedtime.

## 2016-10-09 ENCOUNTER — Emergency Department (HOSPITAL_COMMUNITY)
Admission: EM | Admit: 2016-10-09 | Discharge: 2016-10-09 | Disposition: A | Discharge status: Home / Self Care | Payer: PPO | Attending: Emergency Medicine | Admitting: Emergency Medicine

## 2016-10-09 ENCOUNTER — Encounter (HOSPITAL_COMMUNITY): Payer: Self-pay

## 2016-10-09 DIAGNOSIS — E119 Type 2 diabetes mellitus without complications: Secondary | ICD-10-CM | POA: Insufficient documentation

## 2016-10-09 DIAGNOSIS — Z794 Long term (current) use of insulin: Secondary | ICD-10-CM | POA: Insufficient documentation

## 2016-10-09 DIAGNOSIS — I10 Essential (primary) hypertension: Secondary | ICD-10-CM | POA: Insufficient documentation

## 2016-10-09 DIAGNOSIS — F17210 Nicotine dependence, cigarettes, uncomplicated: Secondary | ICD-10-CM | POA: Diagnosis not present

## 2016-10-09 DIAGNOSIS — Z7982 Long term (current) use of aspirin: Secondary | ICD-10-CM | POA: Diagnosis not present

## 2016-10-09 DIAGNOSIS — Z79899 Other long term (current) drug therapy: Secondary | ICD-10-CM | POA: Diagnosis not present

## 2016-10-09 DIAGNOSIS — K5900 Constipation, unspecified: Secondary | ICD-10-CM | POA: Diagnosis not present

## 2016-10-09 NOTE — ED Notes (Signed)
Case Management at bedside.

## 2016-10-09 NOTE — ED Notes (Signed)
Pt reports last BM 3-4 days ago, pt just had BM here at ED.

## 2016-10-09 NOTE — Discharge Instructions (Signed)
You can try colace, miralax, or senna as needed in the future for constipation.  If rectal bleeding or abdominal pain increases, please call PCP or seek medical care.  Try talking to the social worker at the [facility] about resources as well.

## 2016-10-09 NOTE — ED Triage Notes (Addendum)
Pt. Has not had a DM for 4 days.  Having rectal pain, the BM is right there and he is unable to expel it.   Bright red blood when he wipes.  Denies any an/v/d.   Pt. Did not take his medications today  CBG 243, Pt. Did not take his insulin.  Pt. Is alert and oriented X4. Pt. Is out of all his medications.  Would like to speak to Case management or Social work for assistance.

## 2016-10-09 NOTE — Care Management Note (Signed)
Case Management Note  Patient Details  Name: Charles Fox MRN: [id number] Date of Birth: Nov 02, 1950  Subjective/Objective:  54 Y.[facility] WITH C/O INABILITY TO AFFORD MEDS DUE TO FIXED INCOME. CM consulted to assist with transportation and medications. Unable to assist with meds as pt has [name]. CSW consult placed to assist with transportation although pt refused Bus pass. Tells me he does have son who lives at his home who could come to pick him up but he has told him "not to come".                Action/Plan:CM will sign off for now but will be available should additional discharge needs arise or disposition change.    Expected Discharge Date:                  Expected Discharge Plan:  Home/Self Care  In-House Referral:  Clinical Social Work  Discharge planning Services  CM Consult, Medication Assistance  Post Acute Care Choice:  NA Choice offered to:  Patient  DME Arranged:  N/A DME Agency:     HH Arranged:  NA HH Agency:  NA  Status of Service:  Completed, signed off  If discussed at [place] of Stay Meetings, dates discussed:    Additional Comments:  Charles Sawyers, RN 10/09/2016, 1:23 PM

## 2016-10-09 NOTE — ED Provider Notes (Signed)
[facility]-EMERGENCY DEPT Provider Note   CSN: [id number] Arrival date & time: 10/09/16  0929    History   Chief Complaint Chief Complaint  Patient presents with  . Constipation    HPI Charles Fox is a 66 y.o. male with h/o T2DM, HTN, obesity who presents to ED via ambulance for constipation and abd pain.  Patient reports no BM in 3-4 days PTA.  Had straining with abd pain and small spots of blood on toilet tissue this AM.  Reports that he had BM after arrival today and abd pain is now resolved.  He denies current N/V, abd pain, constipation, melena, BRBPR other than few spots on toilet tissue.  Typically has one soft BM daily.  Does not use any meds for constipation.  Did not try anything for constipation.  Reports that he has been out of all meds, including antihypertensives and diabetes meds for ~1 month as he cannot afford them.  HPI  Past Medical History:  Diagnosis Date  . Diabetes mellitus without complication (HCC)   . Hypertension     Patient Active Problem List   Diagnosis Date Noted  . Urine test positive for microalbuminuria 05/27/2016  . Diabetes mellitus (HCC) 09/04/2014  . Obesity 09/04/2014  . HTN 09/04/2014  . Tobacco use disorder 09/04/2014    Past Surgical History:  Procedure Laterality Date  . BACK SURGERY    . FINGER SURGERY       Home Medications    Prior to Admission medications   Medication Sig Start Date End Date Taking? Authorizing Provider  aspirin 81 MG tablet Take 1 tablet (81 mg total) by mouth daily. 11/21/14   Ryan B Grunz, MD  atorvastatin (LIPITOR) 80 MG tablet Take 1 tablet (80 mg total) by mouth daily. 10/22/15   Ryan B Grunz, MD  Blood Glucose Monitoring Suppl (ONE TOUCH ULTRA 2) W/DEVICE KIT 1 each by Does not apply route once. 02/20/15   Ryan B Grunz, MD  glucose blood (ONE TOUCH ULTRA TEST) test strip Check sugar twice daily, once before breakfast and once in the afternoon 03/18/16   Ryan B Grunz, MD  hydrOXYzine (ATARAX/VISTARIL) 10  MG tablet Take 1 tablet (10 mg total) by mouth 3 (three) times daily as needed. 10/22/15   Ryan B Grunz, MD  Insulin Syringes, Disposable, U-100 0.3 ML MISC As directed 12/23/14   Jennifer Lee H Presson, PA  Lancets (ONETOUCH ULTRASOFT) lancets Use as instructed 02/20/15   Ryan B Grunz, MD  LANTUS SOLOSTAR 100 UNIT/ML Solostar Pen Inject 40 Units into the skin daily at 10 pm. 06/29/16   Taye T Gonfa, MD  lisinopril (PRINIVIL,ZESTRIL) 20 MG tablet Take 1 tablet (20 mg total) by mouth daily. 06/29/16   Taye T Gonfa, MD  metFORMIN (GLUCOPHAGE) 500 MG tablet Take 1 tablet (500 mg total) by mouth 2 (two) times daily with a meal. 06/29/16   Taye T Gonfa, MD    Family History No family history on file.  Social History Social History  Substance Use Topics  . Smoking status: Current Every Day Smoker    Packs/day: 1.00    Types: Cigarettes  . Smokeless tobacco: Never Used  . Alcohol use No     Allergies   Patient has no known allergies.   Review of Systems Review of Systems  Constitutional: Positive for fatigue. Negative for activity change, appetite change, chills and fever.  HENT: Negative.   Eyes: Negative.   Respiratory: Negative.   Cardiovascular: Negative.     Gastrointestinal: Negative.   Genitourinary: Negative.   Musculoskeletal: Negative.   Skin: Negative.   Neurological: Negative.   Psychiatric/Behavioral: Negative.      Physical Exam Updated Vital Signs BP 171/88 (BP Location: Left Arm)   Pulse 95   Temp 97.7 F (36.5 C) (Oral)   Resp 18   Ht 5' 8" (1.727 m)   Wt 119.4 kg   SpO2 98%   BMI 40.04 kg/m   Physical Exam  Constitutional: He is oriented to person, place, and time. He appears well-developed and well-nourished. No distress.  HENT:  Head: Normocephalic and atraumatic.  Right Ear: External ear normal.  Left Ear: External ear normal.  Mouth/Throat: Oropharynx is clear and moist.  Eyes: EOM are normal. Pupils are equal, round, and reactive to light.  Neck:  Neck supple.  Cardiovascular: Normal rate, regular rhythm and normal heart sounds.   No murmur heard. Pulmonary/Chest: Effort normal and breath sounds normal. No respiratory distress. He has no wheezes.  Abdominal: Soft. Bowel sounds are normal. He exhibits no distension and no mass. There is no tenderness. There is no rebound and no guarding.  Musculoskeletal: He exhibits no edema, tenderness or deformity.  Lymphadenopathy:    He has no cervical adenopathy.  Neurological: He is alert and oriented to person, place, and time.  Moves all extremities equally  Skin: Skin is warm and dry. Capillary refill takes less than 2 seconds. No rash noted.  Psychiatric: He has a normal mood and affect. Thought content normal.  Nursing note and vitals reviewed.    ED Treatments / Results  Labs (all labs ordered are listed, but only abnormal results are displayed) Labs Reviewed - No data to display  EKG  EKG Interpretation None       Radiology No results found.  Procedures Procedures (including critical care time)  Medications Ordered in ED Medications - No data to display   Initial Impression / Assessment and Plan / ED Course  I have reviewed the triage vital signs and the nursing notes.  Pertinent labs & imaging results that were available during my care of the patient were reviewed by me and considered in my medical decision making (see chart for details).      Patient complaining of constipation with associated lower abd pain.  All now resolved prior to examination as patient had BM after arrival.  Sounds like rectal bleeding was appropriate for constipation with possible hemorrhoids.  Discussed stool softeners and laxatives.  Consulted CM for medication assistance.  Final Clinical Impressions(s) / ED Diagnoses   Final diagnoses:  Constipation, unspecified constipation type    New Prescriptions New Prescriptions   No medications on file    Virginia Crews, MD,  MPH PGY-3,  [facility] 10/09/2016 1:24 PM    Virginia Crews, MD 10/09/16 [place], MD 10/09/16 1710

## 2016-10-09 NOTE — ED Notes (Signed)
Gave a pt bus ticket.

## 2016-12-06 ENCOUNTER — Ambulatory Visit: Payer: PPO | Admitting: Family Medicine

## 2016-12-19 ENCOUNTER — Ambulatory Visit: Payer: PPO | Admitting: Family Medicine

## 2016-12-22 ENCOUNTER — Ambulatory Visit (INDEPENDENT_AMBULATORY_CARE_PROVIDER_SITE_OTHER): Payer: Medicare HMO | Admitting: Family Medicine

## 2016-12-22 ENCOUNTER — Encounter: Payer: Self-pay | Admitting: Family Medicine

## 2016-12-22 VITALS — BP 146/86 | HR 84 | Temp 98.4°F | Ht 68.0 in | Wt 250.8 lb

## 2016-12-22 DIAGNOSIS — E119 Type 2 diabetes mellitus without complications: Secondary | ICD-10-CM

## 2016-12-22 DIAGNOSIS — IMO0001 Reserved for inherently not codable concepts without codable children: Secondary | ICD-10-CM

## 2016-12-22 DIAGNOSIS — E7800 Pure hypercholesterolemia, unspecified: Secondary | ICD-10-CM | POA: Diagnosis not present

## 2016-12-22 DIAGNOSIS — Z794 Long term (current) use of insulin: Secondary | ICD-10-CM | POA: Diagnosis not present

## 2016-12-22 DIAGNOSIS — I10 Essential (primary) hypertension: Secondary | ICD-10-CM | POA: Diagnosis not present

## 2016-12-22 DIAGNOSIS — E1169 Type 2 diabetes mellitus with other specified complication: Secondary | ICD-10-CM | POA: Insufficient documentation

## 2016-12-22 LAB — POCT GLYCOSYLATED HEMOGLOBIN (HGB A1C): HEMOGLOBIN A1C: 10.9

## 2016-12-22 MED ORDER — LANTUS SOLOSTAR 100 UNIT/ML ~~LOC~~ SOPN: 40.0000 [IU] | PEN_INJECTOR | Freq: Every day | SUBCUTANEOUS | 2 refills | Status: DC

## 2016-12-22 MED ORDER — ATORVASTATIN CALCIUM 80 MG PO TABS: 80.0000 mg | ORAL_TABLET | Freq: Every day | ORAL | 3 refills | Status: DC

## 2016-12-22 MED ORDER — GLUCOSE BLOOD VI STRP: ORAL_STRIP | 3 refills | Status: DC

## 2016-12-22 MED ORDER — LANTUS SOLOSTAR 100 UNIT/ML ~~LOC~~ SOPN: 40.0000 [IU] | PEN_INJECTOR | Freq: Every day | SUBCUTANEOUS | 3 refills | Status: DC

## 2016-12-22 MED ORDER — LISINOPRIL 20 MG PO TABS: 20.0000 mg | ORAL_TABLET | Freq: Every day | ORAL | 3 refills | Status: DC

## 2016-12-22 MED ORDER — METFORMIN HCL 500 MG PO TABS: 500.0000 mg | ORAL_TABLET | Freq: Two times a day (BID) | ORAL | 3 refills | Status: DC

## 2016-12-22 MED ORDER — ONETOUCH ULTRASOFT LANCETS MISC: 3 refills | Status: DC

## 2016-12-22 MED FILL — ONE TOUCH ULTRASOFT LANCETS: 50 days supply | Qty: 100 | Fill #0

## 2016-12-22 MED FILL — LISINOPRIL 20 MG TABLET: 20 | 90 days supply | Qty: 90 | Fill #0

## 2016-12-22 MED FILL — ATORVASTATIN 80 MG TABLET: 80 | 90 days supply | Qty: 90 | Fill #0

## 2016-12-22 MED FILL — metFORMIN HCL 500 MG TABS: 500 | 90 days supply | Qty: 180 | Fill #0

## 2016-12-22 MED FILL — ONE TOUCH ULTRA TEST STRIPS: 50 days supply | Qty: 100 | Fill #0

## 2016-12-22 NOTE — Assessment & Plan Note (Signed)
Did lose some weight - perhaps because of poor DM control.

## 2016-12-22 NOTE — Progress Notes (Signed)
Subjective:     Patient ID: Charles Fox, male   DOB: August 05, 1951, 66 y.o.   MRN: [id number] See attending edits in red HPI  Diabetes Charles. Donivin is here for a complete refill of all of his medications. He hasnt had any of his medications in the last 3 months. No complaints of headache, nausea, polyuria or visual changes. He has not been checking blood sugars at home due to running out of needles. I am not fully sure of the reason for non compliance, I think finances has something to do with it.  I also think he has a poor understanding of the need to control his asymptomatic diseases to prevent long term problems such as MI or CVA.  He started saying he only needed a refill on lantus.  He was also out of his HBP and cholesterol meds.   SH Walking 58mins a day outside Smoking 1pack/day No alcohol Diet- well balanced meals with mlots of water, eating a sweets regularly    Review of Systems  Constitutional: Negative for appetite change, fatigue and unexpected weight change.  Eyes: Negative for visual disturbance.  Respiratory: Negative.   Cardiovascular: Negative.   Gastrointestinal: Negative for diarrhea, nausea and vomiting.  Endocrine: Negative for polyuria.  Neurological: Negative for light-headedness, numbness and headaches.       Objective:   Physical Exam  Eyes: Pupils are equal, round, and reactive to light.  Cardiovascular: Normal rate, regular rhythm and normal heart sounds.  Exam reveals no gallop and no friction rub.   No murmur heard. Neurological: He is alert.  Agree with this exam.  Also, no peripheral edema. Vitals:   12/22/16 0929  BP: (!) 146/86  Pulse: 84  Temp: 98.4 F (36.9 C)       Assessment:     Charles Fox has a history of type 2 diabetes and hypertension both of which he hasn't taken any medication for in the last 3 months. Blood pressure taken today was 146/86 and his hemoglobin A1C came back as 10.9     Plan:     Diabetes: -Refill Lantis,   Metformin, and lancets -Lantis 40 units daily at 10am -Metformin 500mg  BID w/meals  HTN; -Lisinopril 20mg  daily  See my assess and plan under problem list.

## 2016-12-22 NOTE — Patient Instructions (Signed)
I refilled all your medicines.   Get on them and stay on them.  Controling the High blood pressure and diabetes is your best chance to live a long time. Make an appointment on the way out to see Dr. Cyndia Skeeters in 2-3 weeks.  I want to make sure that things are under control once you start your meds back.

## 2016-12-22 NOTE — Assessment & Plan Note (Signed)
Poor control off meds.  Restart lisinopril.   FU two weeks to reassess control on meds.

## 2016-12-22 NOTE — Assessment & Plan Note (Signed)
Very poor control.  Restart lantus and metformin.  Also restart home BS monitoring.

## 2016-12-22 NOTE — Assessment & Plan Note (Signed)
Unknown control off statin.  Restart.  Regardless of LDL, as diabetic needs statin.

## 2016-12-23 LAB — BASIC METABOLIC PANEL
BUN / CREAT RATIO: 13 (ref 10–24)
BUN: 10 mg/dL (ref 8–27)
CO2: 22 mmol/L (ref 18–29)
CREATININE: 0.78 mg/dL (ref 0.76–1.27)
Calcium: 9.2 mg/dL (ref 8.6–10.2)
Chloride: 97 mmol/L (ref 96–106)
GFR, EST AFRICAN AMERICAN: 111 mL/min/{1.73_m2} (ref >59)
GFR, EST NON AFRICAN AMERICAN: 96 mL/min/{1.73_m2} (ref >59)
GLUCOSE: 269 mg/dL — AB (ref 65–99)
Potassium: 4.4 mmol/L (ref 3.5–5.2)
SODIUM: 135 mmol/L (ref 134–144)

## 2016-12-28 ENCOUNTER — Telehealth: Payer: Self-pay | Admitting: Student

## 2016-12-28 NOTE — Telephone Encounter (Signed)
Noted last measured A1c and request a call back to schedule DM f/u. - Charles Fox

## 2017-01-12 ENCOUNTER — Ambulatory Visit: Payer: Medicare HMO | Admitting: Student

## 2017-02-17 MED FILL — LANTUS SOLOSTAR 100 UNITS/M: 100 | 30 days supply | Qty: 12 | Fill #0

## 2017-03-03 NOTE — Telephone Encounter (Signed)
Sched DM f/u appt 03/27/17. Reports dental problems and need for financial assistance. I let him know about the orange card package we have in the front office. - Mesha Guinyard

## 2017-03-09 MED FILL — metFORMIN HCL 500 MG TABS: 500 | 90 days supply | Qty: 180 | Fill #1

## 2017-03-17 MED FILL — LANTUS SOLOSTAR 100 UNITS/M: 100 | 30 days supply | Qty: 12 | Fill #1

## 2017-03-27 ENCOUNTER — Ambulatory Visit (INDEPENDENT_AMBULATORY_CARE_PROVIDER_SITE_OTHER): Payer: PPO | Admitting: Student

## 2017-03-27 ENCOUNTER — Encounter: Payer: Self-pay | Admitting: Student

## 2017-03-27 VITALS — BP 132/72 | HR 91 | Temp 97.5°F | Ht 68.0 in | Wt 257.0 lb

## 2017-03-27 DIAGNOSIS — I10 Essential (primary) hypertension: Secondary | ICD-10-CM | POA: Diagnosis not present

## 2017-03-27 DIAGNOSIS — E7800 Pure hypercholesterolemia, unspecified: Secondary | ICD-10-CM

## 2017-03-27 DIAGNOSIS — E119 Type 2 diabetes mellitus without complications: Secondary | ICD-10-CM | POA: Diagnosis not present

## 2017-03-27 DIAGNOSIS — IMO0001 Reserved for inherently not codable concepts without codable children: Secondary | ICD-10-CM

## 2017-03-27 DIAGNOSIS — Z794 Long term (current) use of insulin: Secondary | ICD-10-CM

## 2017-03-27 DIAGNOSIS — Z1159 Encounter for screening for other viral diseases: Secondary | ICD-10-CM | POA: Diagnosis not present

## 2017-03-27 DIAGNOSIS — Z0000 Encounter for general adult medical examination without abnormal findings: Secondary | ICD-10-CM | POA: Insufficient documentation

## 2017-03-27 DIAGNOSIS — F17200 Nicotine dependence, unspecified, uncomplicated: Secondary | ICD-10-CM

## 2017-03-27 LAB — POCT GLYCOSYLATED HEMOGLOBIN (HGB A1C): HEMOGLOBIN A1C: 9.4

## 2017-03-27 MED ORDER — TETANUS-DIPHTH-ACELL PERTUSSIS 5-2.5-18.5 LF-MCG/0.5 IM SUSP: 0.5000 mL | Freq: Once | INTRAMUSCULAR | 0 refills | Status: AC

## 2017-03-27 MED ORDER — ONETOUCH ULTRA 2 W/DEVICE KIT: 1.0000 | PACK | Freq: Once | 0 refills | Status: AC

## 2017-03-27 NOTE — Assessment & Plan Note (Signed)
On atorvastatin 80 mg daily.  -Lipid panel today -Continue baby aspirin

## 2017-03-27 NOTE — Assessment & Plan Note (Signed)
Discussed about the health consequences of smoking and advised him to quit. He is pre-contemplating

## 2017-03-27 NOTE — Patient Instructions (Signed)
It was great seeing you today! We have addressed the following issues today Diabetes: your A1c is 9.4 % today. It was 10.9%. Your goal A1c is less than 7.0%. See below for more information about A1c.  1. Check your blood glucose in the morning when you wake up and write down the numbers on the book we gave you 2. Inject 40 units of Lantus before bedtime (at 10 PM) 3. Continue your metformin 4. Eat regular meals (breakfast, lunch and dinner) around-the-clock. You may snack as needed. See below about few tips and recommendations on diet and exercise.  5. Watch for symptoms of low blood sugar (hypoglycemia). Read below about these symptoms and management. 6. Please come back and see [place] in 3 months or sooner if as needed.  7. Please bring all your medication bottles to that visit.   What is A1c:  The A1C test result reflects your average blood sugar level for the past two to three months. Specifically, the A1C test measures what percentage of your hemoglobin - a protein in red blood cells that carries oxygen - is coated with sugar (glycated). The higher your A1C level, the poorer your blood sugar control and the higher your risk of diabetes complications. Portion Size    Choose healthier foods such as 100% whole grains, vegetables, fruits, beans, nut seeds, olive oil, most vegetable oils, fat-free dietary, wild game and fish.   Avoid sweet tea, other sweetened beverages, soda, fruit juice, cold cereal and milk and trans fat.   Eat at least 3 meals and 1-2 snacks per day.  Aim for no more than 5 hours between eating.  Eat breakfast within one hour of getting up.    Exercise at least 150 minutes per week, including weight resistance exercises 3 or 4 times per week.   Try to lose at least 7-10% of your current body weight.   Hypoglycemia Hypoglycemia is when the sugar (glucose) level in the blood is too low. Symptoms of low blood sugar may include:  Feeling: ? Hungry. ? Worried or  nervous (anxious). ? Sweaty and clammy. ? Confused. ? Dizzy. ? Sleepy. ? Sick to your stomach (nauseous).  Having: ? A fast heartbeat. ? A headache. ? A change in your vision. ? Jerky movements that you cannot control (seizure). ? Nightmares. ? Tingling or no feeling (numbness) around the mouth, lips, or tongue.  Having trouble with: ? Talking. ? Paying attention (concentrating). ? Moving (coordination). ? Sleeping.  Shaking.  Passing out (fainting).  Getting upset easily (irritability).  Low blood sugar can happen to people who have diabetes and people who do not have diabetes. Low blood sugar can happen quickly, and it can be an emergency. Treating Low Blood Sugar Low blood sugar is often treated by eating or drinking something sugary right away. If you can think clearly and swallow safely, follow the 15:15 rule:  Take 15 grams of a fast-acting carb (carbohydrate). Some fast-acting carbs are: ? 1 tube of glucose gel. ? 3 sugar tablets (glucose pills). ? 6-8 pieces of hard candy. ? 4 oz (120 mL) of fruit juice. ? 4 oz (120 mL) of regular (not diet) soda.  Check your blood sugar 15 minutes after you take the carb.  If your blood sugar is still at or below 70 mg/dL (3.9 mmol/L), take 15 grams of a carb again.  If your blood sugar does not go above 70 mg/dL (3.9 mmol/L) after 3 tries, get help right away.  After your  blood sugar goes back to normal, eat a meal or a snack within 1 hour.  Treating Very Low Blood Sugar If your blood sugar is at or below 54 mg/dL (3 mmol/L), you have very low blood sugar (severe hypoglycemia). This is an emergency. Do not wait to see if the symptoms will go away. Get medical help right away. Call your local emergency services (911 in the [place] Do not drive yourself to the hospital. If you have very low blood sugar and you cannot eat or drink, you may need a glucagon shot (injection). A family member or friend should learn how to check your  blood sugar and how to give you a glucagon shot. Ask your doctor if you need to have a glucagon shot kit at home. Follow these instructions at home: General instructions  Avoid any diets that cause you to not eat enough food. Talk with your doctor before you start any new diet.  Take over-the-counter and prescription medicines only as told by your doctor.  Limit alcohol to no more than 1 drink per day for nonpregnant women and 2 drinks per day for men. One drink equals 12 oz of beer, 5 oz of wine, or 1 oz of hard liquor.  Keep all follow-up visits as told by your doctor. This is important. If You Have Diabetes:   Make sure you know the symptoms of low blood sugar.  Always keep a source of sugar with you, such as: ? Sugar. ? Sugar tablets. ? Glucose gel. ? Fruit juice. ? Regular soda (not diet soda). ? Milk. ? Hard candy. ? Honey.  Take your medicines as told.  Follow your exercise and meal plan. ? Eat on time. Do not skip meals. ? Follow your sick day plan when you cannot eat or drink normally. Make this plan ahead of time with your doctor.  Check your blood sugar as often as told by your doctor. Always check before and after exercise.  Share your diabetes care plan with: ? Your work or school. ? People you live with.  Check your pee (urine) for ketones: ? When you are sick. ? As told by your doctor.  Carry a card or wear jewelry that says you have diabetes. If You Have Low Blood Sugar From Other Causes:   Check your blood sugar as often as told by your doctor.  Follow instructions from your doctor about what you cannot eat or drink. Contact a doctor if:  You have trouble keeping your blood sugar in your target range.  You have low blood sugar often. Get help right away if:  You still have symptoms after you eat or drink something sugary.  Your blood sugar is at or below 54 mg/dL (3 mmol/L).  You have jerky movements that you cannot control.  You pass  out. These symptoms may be an emergency. Do not wait to see if the symptoms will go away. Get medical help right away. Call your local emergency services (911 in the [place] Do not drive yourself to the hospital. This information is not intended to replace advice given to you by your health care provider. Make sure you discuss any questions you have with your health care provider. Document Released: 11/30/2009 Document Revised: 02/11/2016 Document Reviewed: 10/09/2015 Elsevier Interactive Patient Education  2018 [place] with early colon cancer usually have no warning signs or symptoms.  If found early,  most patients can be cured, but if found when it has already spread, the chance of survival is not as good.  Colon cancer is the second most common cause of concern is in the [place] with over 56,000 deaths from colon cancer in 2005  Colon cancer is a common, treatable disease. Screening tests can find a cancer  early, before you have symptoms, and make it more likely that you will survive the disease.  Who needs to be tested? If you are age 13-75 yrs, you should be tested for colon cancer.  Ways to be tested:  A colonoscopy the best test to detect colon cancer. It requires you to drink a bowel preparation to clean out your colon before the test. During this test, a tube with a camera inserted into your rectum and examines your entire colon. You can be given medicine to make you sleepy during the exam. Therefore, you will not be able to drive immediately after the test. There is a small risk of bowel injury during the test.   Stool cards that you can take home and take a sample of your stool is another option. The cards are not as good as colonoscopy at detecting cancer, but the tests are easier and cheaper.   To schedule the colonoscopy, you can call one of the 3 options below:  [facility]. Phone number: [phone]  [facility]. Phone number:  [phone]  [facility]: Phone number [phone]

## 2017-03-27 NOTE — Progress Notes (Signed)
Subjective:    Charles Fox is a 66 y.o. old male here for follow-up on diabetes  HPI Diabetes: patient is on metformin 500 mg twice a day and Lantus 40 units daily. Reports good compliance with his medications. He doesn't check his blood glucose. He says his glucose meter might be out of battery. He reports walking daily and trying to lose weight. Denies drinking soda or packed juices. He is a retired IT consultant. Reports smoking about half a pack a day. Lives with his son. He says he had an eye exam but doesn't remember where.  PMH/Problem List: has Diabetes mellitus ([place]); Morbid obesity ([place]); HTN; Tobacco use disorder; Urine test positive for microalbuminuria; Hypercholesterolemia; and Routine adult health maintenance on his problem list.   has a past medical history of Diabetes mellitus without complication ([place]) and Hypertension.  FH:  No family history on file.  [place] Social History  Substance Use Topics  . Smoking status: Current Every Day Smoker    Packs/day: 1.00    Types: Cigarettes  . Smokeless tobacco: Never Used  . Alcohol use No    Review of Systems Review of systems negative except for pertinent positives and negatives in history of present illness above.     Objective:     Vitals:   03/27/17 0829  BP: 132/72  Pulse: 91  Temp: (!) 97.5 F (36.4 C)  TempSrc: Oral  SpO2: 97%  Weight: 257 lb (116.6 kg)  Height: '5\' 8"'  (1.727 m)   Physical Exam GEN: appears well, no apparent distress. Eyes: conjunctiva without injection, sclera anicteric Oropharynx: mmm without erythema or exudation HEM: negative for cervical or periauricular lymphadenopathies CVS: RRR, nl S1&S2, no murmurs, no edema RESP: no IWOB, good air movement bilaterally, CTAB GI: obese, BS present & normal, soft, NTND MSK: no focal tenderness or notable swelling SKIN: no apparent skin lesion NEURO: alert and oiented appropriately, no gross defecits  PSYCH: euthymic mood with congruent  affect  Diabetic Foot Exam: Inspection: no skin lesion, ulcer or callus. Toenails trimmed  Vascular: DP & PT pulses 2+ bilaterally Neuro: Intact 9-point monofilament exam bilaterally  Diabetic screening tool Barriers to diabetes management: Never for item 1 & 6 for item 2, no for item 3, 4, 7 and 8, yes for item 5 and 6.   Diabetes distress: 1 for each item, total 2  PHQ-9: 0    Assessment and Plan:  Diabetes mellitus A1c 9.4%, down from 10.9%. Reports good compliance with his medications. Walking daily. Diabetic foot exam within normal limits.  -We will continue his metformin 500 mg twice a day and Lantus 40 units nightly -Patient to check his fasting blood glucose and keep glucose log. Gave him glucose log book -Discussed about diet and exercise. Gave handout about portion size.  -Gave prescription for glucometer if his old glucometer is not working -Continue atorvastatin, lisinopril and aspirin -Recommended going to his dentist and an eye doctor -Follow up in 3 months or sooner if needed  Hypercholesterolemia On atorvastatin 80 mg daily.  -Lipid panel today -Continue baby aspirin  Routine adult health maintenance -Gave prescription for Tdap -Discussed about the importance of colonoscopy. Patient to call and schedule an appointment -Hepatitis C screening today  Orders Placed This Encounter  Procedures  . Lipid panel  . Hepatitis C antibody  . POCT glycosylated hemoglobin (Hb A1C)   Meds ordered this encounter  Medications  . Tdap (BOOSTRIX) 5-2.5-18.5 LF-MCG/0.5 injection    Sig: Inject 0.5 mLs into the  muscle once.    Dispense:  0.5 mL    Refill:  0  . Blood Glucose Monitoring Suppl (ONE TOUCH ULTRA 2) w/Device KIT    Sig: 1 each by Does not apply route once.    Dispense:  1 each    Refill:  0   Return in about 3 months (around 06/27/2017) for Diabetes.  Mercy Riding, MD 03/27/17 Pager: [phone]

## 2017-03-27 NOTE — Assessment & Plan Note (Signed)
-  Gave prescription for Tdap -Discussed about the importance of colonoscopy. Patient to call and schedule an appointment -Hepatitis C screening today

## 2017-03-27 NOTE — Assessment & Plan Note (Deleted)
Blood pressure within normal limits. -BMP today

## 2017-03-27 NOTE — Assessment & Plan Note (Addendum)
A1c 9.4%, down from 10.9%. Reports good compliance with his medications. Walking daily. Diabetic foot exam within normal limits.  -We will continue his metformin 500 mg twice a day and Lantus 40 units nightly -Patient to check his fasting blood glucose and keep glucose log. Gave him glucose log book -Discussed about diet and exercise. Gave handout about portion size.  -Gave prescription for glucometer if his old glucometer is not working -Continue atorvastatin, lisinopril and aspirin -Recommended going to his dentist and an eye doctor -Follow up in 3 months or sooner if needed

## 2017-03-28 ENCOUNTER — Encounter: Payer: Self-pay | Admitting: Student

## 2017-03-28 LAB — HEPATITIS C ANTIBODY: Hep C Virus Ab: <0.1 s/co ratio (ref 0.0–0.9)

## 2017-03-28 LAB — LIPID PANEL
CHOL/HDL RATIO: 5.2 ratio — AB (ref 0.0–5.0)
CHOLESTEROL TOTAL: 207 mg/dL — AB (ref 100–199)
HDL: 40 mg/dL (ref >39)
LDL CALC: 149 mg/dL — AB (ref 0–99)
TRIGLYCERIDES: 88 mg/dL (ref 0–149)
VLDL CHOLESTEROL CAL: 18 mg/dL (ref 5–40)

## 2017-03-28 NOTE — Progress Notes (Signed)
Result letter for Lipid panel, A1c and Hep C sent to patient.

## 2017-05-10 ENCOUNTER — Telehealth: Payer: Self-pay | Admitting: Student

## 2017-05-10 NOTE — Telephone Encounter (Signed)
Pt is calling and would like to know if we have any samples of insulin for him. If so please let him know so that he can pick this up. jw

## 2017-05-11 NOTE — Telephone Encounter (Signed)
Please give him two pens. Thank you! Bretta Bang

## 2017-05-11 NOTE — Telephone Encounter (Signed)
Contacted pt to let him know that there would be 2 Lantus pens here for him to pick up.  Lantus SoloStar (2 Pens given) 100 units/mL One 3 mL prefilled pen [employer] [id number] Lot # [id number] Expiration-03-19-2019

## 2017-05-11 NOTE — Telephone Encounter (Signed)
Pt came and picked up samples mentioned below. Charles Fox, April D, [place]

## 2017-06-22 MED FILL — LANTUS SOLOSTAR 100 UNITS/M: 100 | 30 days supply | Qty: 12 | Fill #2

## 2017-07-04 ENCOUNTER — Ambulatory Visit: Payer: PPO | Admitting: Student

## 2017-07-21 ENCOUNTER — Ambulatory Visit (INDEPENDENT_AMBULATORY_CARE_PROVIDER_SITE_OTHER): Payer: PPO | Admitting: Student

## 2017-07-21 ENCOUNTER — Encounter: Payer: Self-pay | Admitting: Student

## 2017-07-21 VITALS — BP 132/68 | HR 89 | Temp 98.1°F | Ht 68.0 in | Wt 258.2 lb

## 2017-07-21 DIAGNOSIS — E119 Type 2 diabetes mellitus without complications: Secondary | ICD-10-CM

## 2017-07-21 DIAGNOSIS — K029 Dental caries, unspecified: Secondary | ICD-10-CM | POA: Diagnosis not present

## 2017-07-21 DIAGNOSIS — Z23 Encounter for immunization: Secondary | ICD-10-CM

## 2017-07-21 DIAGNOSIS — Z794 Long term (current) use of insulin: Secondary | ICD-10-CM | POA: Diagnosis not present

## 2017-07-21 LAB — POCT GLYCOSYLATED HEMOGLOBIN (HGB A1C): Hemoglobin A1C: 9.6

## 2017-07-21 MED ORDER — METFORMIN HCL 1000 MG PO TABS: 1000.0000 mg | ORAL_TABLET | Freq: Two times a day (BID) | ORAL | 3 refills | Status: DC

## 2017-07-21 MED ORDER — CHLORHEXIDINE GLUCONATE 0.12 % MT SOLN: 15.0000 mL | Freq: Two times a day (BID) | OROMUCOSAL | 3 refills | Status: DC

## 2017-07-21 MED FILL — CHLORHEXIDINE 0.12% RINSE: 0.12 | 16 days supply | Qty: 473 | Fill #0

## 2017-07-21 MED FILL — metFORMIN HCL 1000 MG TABS: 1000 | 90 days supply | Qty: 180 | Fill #0

## 2017-07-21 NOTE — Progress Notes (Signed)
Subjective:    Charles Fox is a 66 y.o. old male here for follow-up on diabetes.  HPI Diabetes: A1c 9.6%.  It was 9.4% about 4 months ago.  He is on Lantus 40 units daily and metformin 500 mg twice a day.  He reports good compliance with his medications.  He reports tolerating his medication except for intermittent diarrhea with metformin. Doesn't keep his blood glucose log nor remember the numbers. He reports watching what he eats. He says he cut back on his eating. He doesn't eat bread. He usually eats oatmeal with coffee or water for breakfast for dinner. Doesn't eat his lunch. Eats some beans for dinner.  He walks daily for 30-45 min but slow. Checks his blood glucose after he eats his breakfast.  Lives by himself.  He denies vision change, numbness in his legs, polyuria and polydipsia.  He denies symptoms of hypoglycemia. Eye exam: at [facility] on [address] last year.  Dental exam: he has not been to dentist for long. He reports dental pain.  Denies fever, swelling or drainage.  States that he accidentally washed his insurance card was disclosed.  He is waiting on the new insurance card to arrive.  PMH/Problem List: has Diabetes mellitus ([place]); Morbid obesity ([place]); HTN; Tobacco use disorder; Urine test positive for microalbuminuria; Hypercholesterolemia; and Routine adult health maintenance on his problem list.   has a past medical history of Diabetes mellitus without complication ([place]) and Hypertension.  FH:  No family history on file.  [place] Social History  Substance Use Topics  . Smoking status: Current Every Day Smoker    Packs/day: 1.00    Types: Cigarettes  . Smokeless tobacco: Never Used  . Alcohol use No    Review of Systems Review of systems negative except for pertinent positives and negatives in history of present illness above.     Objective:     Vitals:   07/21/17 1346  BP: 132/68  Pulse: 89  Temp: 98.1 F (36.7 C)  TempSrc: Oral  SpO2: 95%  Weight: 258 lb 3.2 oz  (117.1 kg)  Height: 5\' 8"  (1.727 m)   Body mass index is 39.26 kg/m.  Physical Exam GEN: appears well, no apparent distress. Head: normocephalic and atraumatic  Eyes: conjunctiva without injection, sclera anicteric Oropharynx: mmm without erythema or exudation. Poor dentition with dental caries.  HEM: negative for cervical or periauricular lymphadenopathies CVS: RRR, nl S1&S2, no murmurs, no edema RESP: no IWOB, good air movement bilaterally, CTAB MSK: no focal tenderness or notable swelling SKIN: no apparent skin lesion NEURO: alert and oiented appropriately, no gross deficits  PSYCH: euthymic mood with congruent affect    Assessment and Plan:  1. Type 2 diabetes mellitus without complication, with long-term current use of insulin ([place]): Poorly controlled.  A1c went up from 9.4% to 9.6%.  He seems to have poor health literacy to follow through health recommendations.  Will increase his metformin to 1000 mg twice a day.  He can back off to 500 mg twice a day if he develops worsening of diarrhea.  We will continue Lantus 40 units.  I suggested injecting before bedtime and checking his blood glucose in the morning.  In that way, we would be easier to titrate his Lantus if he keeps his blood glucose log.  We will try to obtain his eye exam results from [facility].   2. Dental caries: Poor dentition with possible with possible peridentitis.  Gave him prescription for Peridex mouthwash.  Recommended brushing  his teeth twice a day and using Peridex twice a day. I urged him to make a dental appointment as soon as possible  3. Vaccine for diphtheria-tetanus-pertussis, combined - Tdap vaccine greater than or equal to 7yo IM - Patient declined flu vaccine today.  Return in about 3 months (around 10/21/2017) for DM.  Mercy Riding, MD 07/21/17 Pager: [phone]

## 2017-07-21 NOTE — Patient Instructions (Signed)
It was great seeing you today! We have addressed the following issues today Diabetes: your A1c is 9.6 % today. It was 9.4 %. Your goal A1c is less than 8.0%. See below for more information about A1c.  1. Check your blood glucose every day before breakfast and write down the numbers.  2. Continue injecting Lantus 40 units daily, preferably before bedtime 3. We have increased your metformin to 1000 mg twice a day. 4. Follow-up in 3 months  For your tooth pain: I have sent a prescription for mouthwash to your pharmacy.  Brushes teeth twice a day.  Use the mouthwash twice a day.  Please schedule a follow-up with your dentist as soon as you get the card.   What is A1c:  The A1C test result reflects your average blood sugar level for the past two to three months. Specifically, the A1C test measures what percentage of your hemoglobin - a protein in red blood cells that carries oxygen - is coated with sugar (glycated). The higher your A1C level, the poorer your blood sugar control and the higher your risk of diabetes complications. Portion Size    Choose healthier foods such as 100% whole grains, vegetables, fruits, beans, nut seeds, olive oil, most vegetable oils, fat-free dietary, wild game and fish.   Avoid sweet tea, other sweetened beverages, soda, fruit juice, cold cereal and milk and trans fat.   Eat at least 3 meals and 1-2 snacks per day.  Aim for no more than 5 hours between eating.  Eat breakfast within one hour of getting up.    Exercise at least 150 minutes per week, including weight resistance exercises 3 or 4 times per week.   Try to lose at least 7-10% of your current body weight.   Hypoglycemia Hypoglycemia is when the sugar (glucose) level in the blood is too low. Symptoms of low blood sugar may include:  Feeling: ? Hungry. ? Worried or nervous (anxious). ? Sweaty and clammy. ? Confused. ? Dizzy. ? Sleepy. ? Sick to your stomach (nauseous).  Having: ? A fast  heartbeat. ? A headache. ? A change in your vision. ? Jerky movements that you cannot control (seizure). ? Nightmares. ? Tingling or no feeling (numbness) around the mouth, lips, or tongue.  Having trouble with: ? Talking. ? Paying attention (concentrating). ? Moving (coordination). ? Sleeping.  Shaking.  Passing out (fainting).  Getting upset easily (irritability).  Low blood sugar can happen to people who have diabetes and people who do not have diabetes. Low blood sugar can happen quickly, and it can be an emergency. Treating Low Blood Sugar Low blood sugar is often treated by eating or drinking something sugary right away. If you can think clearly and swallow safely, follow the 15:15 rule:  Take 15 grams of a fast-acting carb (carbohydrate). Some fast-acting carbs are: ? 1 tube of glucose gel. ? 3 sugar tablets (glucose pills). ? 6-8 pieces of hard candy. ? 4 oz (120 mL) of fruit juice. ? 4 oz (120 mL) of regular (not diet) soda.  Check your blood sugar 15 minutes after you take the carb.  If your blood sugar is still at or below 70 mg/dL (3.9 mmol/L), take 15 grams of a carb again.  If your blood sugar does not go above 70 mg/dL (3.9 mmol/L) after 3 tries, get help right away.  After your blood sugar goes back to normal, eat a meal or a snack within 1 hour.  Treating Very Low Blood  Sugar If your blood sugar is at or below 54 mg/dL (3 mmol/L), you have very low blood sugar (severe hypoglycemia). This is an emergency. Do not wait to see if the symptoms will go away. Get medical help right away. Call your local emergency services (911 in the [place] Do not drive yourself to the hospital. If you have very low blood sugar and you cannot eat or drink, you may need a glucagon shot (injection). A family member or friend should learn how to check your blood sugar and how to give you a glucagon shot. Ask your doctor if you need to have a glucagon shot kit at home. Follow these  instructions at home: General instructions  Avoid any diets that cause you to not eat enough food. Talk with your doctor before you start any new diet.  Take over-the-counter and prescription medicines only as told by your doctor.  Limit alcohol to no more than 1 drink per day for nonpregnant women and 2 drinks per day for men. One drink equals 12 oz of beer, 5 oz of wine, or 1 oz of hard liquor.  Keep all follow-up visits as told by your doctor. This is important. If You Have Diabetes:   Make sure you know the symptoms of low blood sugar.  Always keep a source of sugar with you, such as: ? Sugar. ? Sugar tablets. ? Glucose gel. ? Fruit juice. ? Regular soda (not diet soda). ? Milk. ? Hard candy. ? Honey.  Take your medicines as told.  Follow your exercise and meal plan. ? Eat on time. Do not skip meals. ? Follow your sick day plan when you cannot eat or drink normally. Make this plan ahead of time with your doctor.  Check your blood sugar as often as told by your doctor. Always check before and after exercise.  Share your diabetes care plan with: ? Your work or school. ? People you live with.  Check your pee (urine) for ketones: ? When you are sick. ? As told by your doctor.  Carry a card or wear jewelry that says you have diabetes. If You Have Low Blood Sugar From Other Causes:   Check your blood sugar as often as told by your doctor.  Follow instructions from your doctor about what you cannot eat or drink. Contact a doctor if:  You have trouble keeping your blood sugar in your target range.  You have low blood sugar often. Get help right away if:  You still have symptoms after you eat or drink something sugary.  Your blood sugar is at or below 54 mg/dL (3 mmol/L).  You have jerky movements that you cannot control.  You pass out. These symptoms may be an emergency. Do not wait to see if the symptoms will go away. Get medical help right away. Call your  local emergency services (911 in the [place] Do not drive yourself to the hospital. This information is not intended to replace advice given to you by your health care provider. Make sure you discuss any questions you have with your health care provider. Document Released: 11/30/2009 Document Revised: 02/11/2016 Document Reviewed: 10/09/2015 Elsevier Interactive Patient Education  [employer].

## 2017-08-14 ENCOUNTER — Telehealth: Payer: Self-pay | Admitting: Student

## 2017-08-14 DIAGNOSIS — Z794 Long term (current) use of insulin: Principal | ICD-10-CM

## 2017-08-14 DIAGNOSIS — E119 Type 2 diabetes mellitus without complications: Secondary | ICD-10-CM

## 2017-08-14 MED ORDER — LANTUS SOLOSTAR 100 UNIT/ML ~~LOC~~ SOPN: 40.0000 [IU] | PEN_INJECTOR | Freq: Every day | SUBCUTANEOUS | 0 refills | Status: DC

## 2017-08-14 NOTE — Telephone Encounter (Signed)
Spoke to Dr. Wendy Poet. Pt given 1 sample. Ottis Stain, CMA

## 2017-08-14 NOTE — Telephone Encounter (Signed)
Patient came by office and stated he is totally out of his insulin & does not get paid until the 1st of Dec. Do we have any samples he could have to last him until then. [phone]

## 2017-08-23 MED FILL — LANTUS SOLOSTAR 100 UNITS/M: 100 | 30 days supply | Qty: 12 | Fill #3

## 2017-08-23 MED FILL — CHLORHEXIDINE 0.12% RINSE: 0.12 | 16 days supply | Qty: 473 | Fill #1

## 2017-09-23 ENCOUNTER — Encounter (HOSPITAL_COMMUNITY): Payer: Self-pay | Admitting: *Deleted

## 2017-09-23 ENCOUNTER — Emergency Department (HOSPITAL_COMMUNITY)
Admission: EM | Admit: 2017-09-23 | Discharge: 2017-09-23 | Disposition: A | Discharge status: Home / Self Care | Payer: Medicare HMO | Attending: Emergency Medicine | Admitting: Emergency Medicine

## 2017-09-23 ENCOUNTER — Other Ambulatory Visit: Payer: Self-pay

## 2017-09-23 DIAGNOSIS — R0981 Nasal congestion: Secondary | ICD-10-CM | POA: Diagnosis not present

## 2017-09-23 DIAGNOSIS — Z79899 Other long term (current) drug therapy: Secondary | ICD-10-CM | POA: Diagnosis not present

## 2017-09-23 DIAGNOSIS — E119 Type 2 diabetes mellitus without complications: Secondary | ICD-10-CM | POA: Diagnosis not present

## 2017-09-23 DIAGNOSIS — Z87891 Personal history of nicotine dependence: Secondary | ICD-10-CM | POA: Diagnosis not present

## 2017-09-23 DIAGNOSIS — Z7982 Long term (current) use of aspirin: Secondary | ICD-10-CM | POA: Diagnosis not present

## 2017-09-23 DIAGNOSIS — I10 Essential (primary) hypertension: Secondary | ICD-10-CM | POA: Insufficient documentation

## 2017-09-23 DIAGNOSIS — Z87898 Personal history of other specified conditions: Secondary | ICD-10-CM | POA: Insufficient documentation

## 2017-09-23 DIAGNOSIS — Z7984 Long term (current) use of oral hypoglycemic drugs: Secondary | ICD-10-CM | POA: Insufficient documentation

## 2017-09-23 DIAGNOSIS — R040 Epistaxis: Secondary | ICD-10-CM | POA: Diagnosis not present

## 2017-09-23 DIAGNOSIS — J3489 Other specified disorders of nose and nasal sinuses: Secondary | ICD-10-CM | POA: Diagnosis not present

## 2017-09-23 NOTE — Discharge Instructions (Signed)
Please read attached information regarding your condition. Apply pressure and lean forward when nosebleed begins. If these maneuvers do not work, use Afrin (oxymetazoline) nasal spray in the affected nostril. Return to ED if symptoms persist despite the use of the above measures, lightheadedness, loss of consciousness, severe bleeding, trouble breathing.

## 2017-09-23 NOTE — ED Provider Notes (Signed)
[facility] EMERGENCY DEPARTMENT Provider Note   CSN: [id number] Arrival date & time: 09/23/17  1806     History   Chief Complaint Chief Complaint  Patient presents with  . Epistaxis    HPI CARROL BONDAR is a 67 y.o. male with a past medical history of hypertension, diabetes, presents to ED for evaluation of epistaxis.  He states that for the past 3 days, he has had one episode of epistaxis that last anywhere from 5-10 minutes and resolve with pressure.  He did have one prior to arrival to the ED but states that it is resolved.  He does report nasal congestion and frequent nose picking as a result of it.  He denies any blood thinner use, lightheadedness, loss of consciousness, trouble breathing, trouble swallowing, hematemesis.  He denies any injury or trauma to the nose.   HPI  Past Medical History:  Diagnosis Date  . Diabetes mellitus without complication ([place])   . Hypertension     Patient Active Problem List   Diagnosis Date Noted  . Routine adult health maintenance 03/27/2017  . Hypercholesterolemia 12/22/2016  . Urine test positive for microalbuminuria 05/27/2016  . Diabetes mellitus ([place]) 09/04/2014  . Morbid obesity ([place]) 09/04/2014  . HTN 09/04/2014  . Tobacco use disorder 09/04/2014    Past Surgical History:  Procedure Laterality Date  . BACK SURGERY    . FINGER SURGERY         Home Medications    Prior to Admission medications   Medication Sig Start Date End Date Taking? Authorizing Provider  aspirin 81 MG tablet Take 1 tablet (81 mg total) by mouth daily. 11/21/14   Patrecia Pour, MD  atorvastatin (LIPITOR) 80 MG tablet Take 1 tablet (80 mg total) by mouth daily. 12/22/16   Zenia Resides, MD  chlorhexidine (PERIDEX) 0.12 % solution Use as directed 15 mLs in the mouth or throat 2 (two) times daily. 07/21/17   Mercy Riding, MD  glucose blood (ONE TOUCH ULTRA TEST) test strip Check sugar twice daily, once before breakfast and once in  the afternoon 12/22/16   Zenia Resides, MD  hydrOXYzine (ATARAX/VISTARIL) 10 MG tablet Take 1 tablet (10 mg total) by mouth 3 (three) times daily as needed. 10/22/15   Patrecia Pour, MD  Insulin Syringes, Disposable, U-100 0.3 ML MISC As directed 12/23/14   Presson, Audelia Hives, PA  Lancets [facility] ULTRASOFT) lancets Use as instructed 12/22/16   Zenia Resides, MD  LANTUS SOLOSTAR 100 UNIT/ML Solostar Pen Inject 40 Units into the skin daily at 10 pm. 08/14/17   McDiarmid, Blane Ohara, MD  lisinopril (PRINIVIL,ZESTRIL) 20 MG tablet Take 1 tablet (20 mg total) by mouth daily. 12/22/16   Zenia Resides, MD  metFORMIN (GLUCOPHAGE) 1000 MG tablet Take 1 tablet (1,000 mg total) by mouth 2 (two) times daily with a meal. 07/21/17   Mercy Riding, MD    Family History No family history on file.  Social History Social History   Tobacco Use  . Smoking status: Current Every Day Smoker    Packs/day: 0.50    Types: Cigarettes  . Smokeless tobacco: Never Used  Substance Use Topics  . Alcohol use: No  . Drug use: Not on file     Allergies   Patient has no known allergies.   Review of Systems Review of Systems  Constitutional: Negative for chills and fever.  HENT: Positive for congestion, nosebleeds and rhinorrhea. Negative for  drooling, ear discharge, ear pain, facial swelling, postnasal drip, sinus pressure and sinus pain.   Respiratory: Negative for shortness of breath.   Gastrointestinal: Negative for nausea and vomiting.     Physical Exam Updated Vital Signs BP (!) 169/85 (BP Location: Right Arm)   Pulse 92   Temp 98.8 F (37.1 C) (Oral)   Resp 16   Ht 5\' 8"  (1.727 m)   Wt 113.4 kg (250 lb)   SpO2 99%   BMI 38.01 kg/m   Physical Exam  Constitutional: He appears well-developed and well-nourished. No distress.  Nontoxic appearing and in no acute distress.  HENT:  Head: Normocephalic and atraumatic.  Nose: Mucosal edema present. No rhinorrhea, nose lacerations, sinus  tenderness, nasal deformity, septal deviation or nasal septal hematoma. No epistaxis.  No foreign bodies.  No septal hematoma or epistaxis noted at this time.  Breathing with normal effort.  Eyes: Conjunctivae and EOM are normal. No scleral icterus.  Neck: Normal range of motion.  Pulmonary/Chest: Effort normal. No respiratory distress.  Neurological: He is alert.  Skin: No rash noted. He is not diaphoretic.  Psychiatric: He has a normal mood and affect.  Nursing note and vitals reviewed.    ED Treatments / Results  Labs (all labs ordered are listed, but only abnormal results are displayed) Labs Reviewed - No data to display  EKG  EKG Interpretation None       Radiology No results found.  Procedures Procedures (including critical care time)  Medications Ordered in ED Medications - No data to display   Initial Impression / Assessment and Plan / ED Course  I have reviewed the triage vital signs and the nursing notes.  Pertinent labs & imaging results that were available during my care of the patient were reviewed by me and considered in my medical decision making (see chart for details).     Patient presents to ED for evaluation of epistaxis.  He states that for the past 3 days he has had one episode of epistaxis from the right nostril lasting 5-10 minutes each.  The symptoms resolve with direct pressure and leaning forward.  He did have one prior to arrival here in the ED but has since resolved.  He also reports nasal congestion and rhinorrhea and frequent nose picking as a result of it.  I explained to him that digital trauma is most likely the cause of his epistaxis.  There is no epistaxis or septal hematoma noted at this time.  He is having no difficulty breathing.  He denies any hematemesis, trouble swallowing.  He is overall well-appearing.  Given patient instructions on pressure and leaning forward as well as option of taking Afrin as needed.  Advised to return to ED if  symptoms persist despite the use of these above measures.  Patient appears stable for discharge at this time.  Strict return precautions given.  Final Clinical Impressions(s) / ED Diagnoses   Final diagnoses:  H/O epistaxis    ED Discharge Orders    None     Portions of this note were generated with Dragon dictation software. Dictation errors may occur despite best attempts at proofreading.    Delia Heady, PA-C 09/23/17 1945    Little, Wenda Overland, MD 09/25/17 2039

## 2017-09-23 NOTE — ED Triage Notes (Signed)
Pt states epistaxis x 1 hour today.  No active bleeding at this time.

## 2017-09-26 ENCOUNTER — Other Ambulatory Visit: Payer: Self-pay

## 2017-09-26 ENCOUNTER — Other Ambulatory Visit: Payer: Self-pay | Admitting: *Deleted

## 2017-09-26 NOTE — Patient Outreach (Signed)
[facility]) Care Management  09/26/2017  Charles Fox 02-Aug-1951 [id number]  Referral via [facility] ED census/outreach tool completed;  Recent visit to ED for nosebleed;   PCP-[facility]-Dr. Wendee Beavers Dx: DM, HTN  Call #1 to patient who was advise of reason for call & [facility] care management services.  Patient consented to telephone assessment. States major health issues diabetes & HTN. States he goes to primary care provides every 3-4 months for check ups & blood work. Voices that he manages his own medications & administers he own insulin.  States he currently is not checking blood sugars because having a problem with glucometer. States he uses one that use to belong to his father.  Advised patient to call customer service of his health insurance provider to get benefits information on glucometer coverage.  Advised he would need to get prescription from MD and take to his pharmacy if he has coverage. Patient voices understanding & states he will call.  States does not know what A1C level is & what numbers indicate, (noted last A1C 9.6 on 07/21/2017). Patient voices he knows to drink or eat something sweet if he has insulin reaction.  States he rarely has reactions. States he sometimes does not take full dosage of metformin because it causes him to have diarrhea. Advised patient to let his MD know. States he plans to let him know.   Patient voices he has never taken diabetes classes. States he does need reminders & needs to be refreshed on some things as he cares for himself. States he learned a lot by being around immediate family members that had the condition.   Patient consents to services of [facility] care management  . Recommendations for referral to Health Coach.  Patient consents to Health Coach referral.   Plan: Send to care management assistant to assign to Health Coach for disease management (Diabetes Mellitus).  Sherrin Daisy, RN BSN [place]  Management Coordinator [facility] Care Management  [phone]

## 2017-09-27 ENCOUNTER — Encounter: Payer: Self-pay | Admitting: *Deleted

## 2017-09-28 MED FILL — LANTUS SOLOSTAR 100 UNITS/M: 100 | 30 days supply | Qty: 12 | Fill #4

## 2017-09-29 ENCOUNTER — Telehealth: Payer: Self-pay

## 2017-09-29 DIAGNOSIS — Z794 Long term (current) use of insulin: Principal | ICD-10-CM

## 2017-09-29 DIAGNOSIS — E119 Type 2 diabetes mellitus without complications: Secondary | ICD-10-CM

## 2017-09-29 MED ORDER — INSULIN PEN NEEDLE 31G X 5 MM MISC: 3 refills | Status: DC

## 2017-09-29 NOTE — Telephone Encounter (Signed)
Sent refill on Unifine Pentips, 31G, 65mm, Quantity of 100, with 3 refills as requested by his pharmacy. Substitution permitted.

## 2017-09-29 NOTE — Telephone Encounter (Signed)
Fax received from [place] asking for refill of Unifine Pentips, 31GX3/16", Quantity of 100, with 3 refills. Ottis Stain, CMA

## 2017-10-09 ENCOUNTER — Other Ambulatory Visit: Payer: Self-pay | Admitting: *Deleted

## 2017-10-09 NOTE — Patient Outreach (Signed)
[place] [facility]) Care Management  10/09/2017  Charles Fox Jul 05, 1951 [id number]   RN Health Coach attempted #1 follow up outreach call to patient.  Patient was unavailable. HIPPA compliance voicemail message left with return callback number.  Plan: RN will call patient again within 14 days.  [place] Care Management [phone]

## 2017-10-18 ENCOUNTER — Telehealth: Payer: Self-pay

## 2017-10-18 ENCOUNTER — Other Ambulatory Visit: Payer: Self-pay | Admitting: *Deleted

## 2017-10-18 ENCOUNTER — Other Ambulatory Visit: Payer: Self-pay | Admitting: Student

## 2017-10-18 DIAGNOSIS — Z794 Long term (current) use of insulin: Principal | ICD-10-CM

## 2017-10-18 DIAGNOSIS — E119 Type 2 diabetes mellitus without complications: Secondary | ICD-10-CM

## 2017-10-18 NOTE — Telephone Encounter (Signed)
Spoke with Anne Fu, RN with [facility] regarding patient. She had done some telephonic coaching with patient, please see encounter in chart. She states that patient does not have glucometer and is taking insulin. She requests that prescription for glucometer and lancets be sent in. Upon chart review, PCP has done this already today. She will inform patient to pick up meter.  She also wanted Dr. Cyndia Skeeters to be aware that patient is NOT taking Metformin and is taking his Lantus in the AM instead of at night. Patient does not have DM f/u scheduled with PCP. Danley Danker, RN [facility] RN)

## 2017-10-18 NOTE — Progress Notes (Signed)
Sent Rx for one touch diabetic monitoring kit

## 2017-10-18 NOTE — Patient Outreach (Signed)
[place] [facility]) Care Management  10/18/2017   KAYDYN SAYAS 1950/10/15 [id number]  Subjective: RN Health Coach telephone call to patient.  Hipaa compliance verified. Per patient he has not checked his blood sugar. Patient stated that he does not have a meter. Patient stated he would like a free style libre. RN explained that patient checks his blood sugar once a days so this may not qualify to meet the Free style guidelines. The patient stated that he takes his insulin and sometimes afterwards he feels weak. Patient does not know the signs and symptoms of Hypo and Hyperglycemia.  [place] he has to stop and rest. Patient does not go to  Podiatrist. He stated his feet are very ticklish so he cuts his own toenails. Patient was having a lot of problems with his phone going in and out.  Per patient he will go today and get that fixed. RN will call patient to completes the outreach information.  Current Medications:  Current Outpatient Medications  Medication Sig Dispense Refill  . aspirin 81 MG tablet Take 1 tablet (81 mg total) by mouth daily. 90 tablet 3  . atorvastatin (LIPITOR) 80 MG tablet Take 1 tablet (80 mg total) by mouth daily. 90 tablet 3  . lisinopril (PRINIVIL,ZESTRIL) 20 MG tablet Take 1 tablet (20 mg total) by mouth daily. 90 tablet 3  . chlorhexidine (PERIDEX) 0.12 % solution Use as directed 15 mLs in the mouth or throat 2 (two) times daily. 473 mL 3  . glucose blood (ONE TOUCH ULTRA TEST) test strip Check sugar twice daily, once before breakfast and once in the afternoon 100 each 3  . hydrOXYzine (ATARAX/VISTARIL) 10 MG tablet Take 1 tablet (10 mg total) by mouth 3 (three) times daily as needed. 30 tablet 0  . Insulin Pen Needle (UNIFINE PENTIPS) 31G X 5 MM MISC Use one to inject your insulin once daily 100 each 3  . Insulin Syringes, Disposable, U-100 0.3 ML MISC As directed 100 each 0  . Lancets (ONETOUCH ULTRASOFT) lancets  Use as instructed 100 each 3  . LANTUS SOLOSTAR 100 UNIT/ML Solostar Pen Inject 40 Units into the skin daily at 10 pm. 1 pen 0  . metFORMIN (GLUCOPHAGE) 1000 MG tablet Take 1 tablet (1,000 mg total) by mouth 2 (two) times daily with a meal. (Patient not taking: Reported on 10/18/2017) 180 tablet 3   No current facility-administered medications for this visit.     Functional Status:  In your present state of health, do you have any difficulty performing the following activities: 10/18/2017 09/26/2017  Hearing? N N  Vision? N N  Difficulty concentrating or making decisions? N N  Walking or climbing stairs? Y Y  Dressing or bathing? N N  Doing errands, shopping? N N  Preparing Food and eating ? N -  Using the Toilet? N -  In the past six months, have you accidently leaked urine? N -  Do you have problems with loss of bowel control? N -  Managing your Medications? N -  Managing your Finances? N -  Housekeeping or managing your Housekeeping? N -  Some recent data might be hidden    Fall/Depression Screening: Fall Risk  10/18/2017 07/21/2017 12/22/2016  Falls in the past year? No No No   PHQ 2/9 Scores 10/18/2017 09/26/2017 07/21/2017 03/27/2017 12/22/2016 06/29/2016 05/26/2016  PHQ - 2 Score 0 0 0 0 2 0 0   THN CM Care Plan  Problem One     Most Recent Value  Care Plan Problem One  Knowledge Deficit in Self Management of Diabetes  Role Documenting the Problem One  [place] for Problem One  Active  [facility] Long Term Goal   Patient will report a decrease from 9.6  in his A1C within the next 90 days   [place] Term Goal Start Date  10/18/17  Interventions for Problem One Long Term Goal  RN discussed with the patient about what the A1C is means. RN discussed with patient that his fasting blood sugar needs to be blow 130 for the A1C to be <7. RN sent EMMI educational material on Why get your A1C checked. RN will follow up outreach for further discussion and teach back.   THN CM Short Term Goal  #1   Patient will understand and verbalize the signs and symptoms of hypo and hyperglycemia within the next 30 days  THN CM Short Term Goal #1 Start Date  10/18/17  Interventions for Short Term Goal #1  RN discussed the signs and symptoms of hyper and hypoglycemia. RN sent a picture chart of faces to describe the symptoms. RN will follow up for further discussion an outreach.   THN CM Short Term Goal #2   Patient report checking and document blood sugars daily within the next 30 days  THN CM Short Term Goal #2 Start Date  10/18/17  Interventions for Short Term Goal #2  RN discussed the importance of checking his blood sugars. RN called Dr office for meter. RN sent patient a 2019 calendar book to document blood sugars. RN sent EMMI education on WHY and When to check your blood sugars RN will follow up with further discussion andout reach       Assessment:  Patient does not have a meter Patient has not been checking blood sugars before taking insulin Patient does not know the signs and symptoms of hypo and hyperglycemia A1C is 9.6 Patient does not know what the A1C means Patient will benefit from [place] telephonic outreach for education and support for diabetes self management.  Plan:  RN called Dr office for meter RN discussed checking blood sugars daily and documenting RN sent EMMI educational material on Why and when to check blood sugars RN sent EMMI educational material on  Why get you A1C checked RN sent EMMI educational material on  High and low blood sugars RN sent facial chart on hypo and hyperglycemia RN sent 2019 Calendar book for documenting RN sent Living well with diabetes Patient will get his phone fixed by next outreach RN will follow up with further outreach and discussion in February  Perri Aragones [place] Management [phone]

## 2017-11-01 ENCOUNTER — Other Ambulatory Visit: Payer: Self-pay | Admitting: Pharmacist

## 2017-11-01 NOTE — Patient Outreach (Signed)
[place] [facility]) Care Management  11/01/2017  SANAV REMER [birth year] [id number]  IBRAHEM VOLKMAN was referred to pharmacy for medication adherence. Per referral from [facility], patient non adherent to metformin due to diarrhea, does not check a blood sugar and stated that he has no meter. Left a HIPAA compliant message on the patient's voicemail. If have not heard from patient by 11/03/17, will give him another call at that time.  [name], PharmD, [place] Management [phone]

## 2017-11-03 ENCOUNTER — Ambulatory Visit: Payer: Self-pay | Admitting: Pharmacist

## 2017-11-03 ENCOUNTER — Other Ambulatory Visit: Payer: Self-pay | Admitting: Pharmacist

## 2017-11-03 NOTE — Patient Outreach (Signed)
[place] [facility]) Care Management  11/03/2017  [name] 08-Jul-1951 [id number]  Charles Fox was referred to pharmacy for medication adherence. Per referral from [facility], patient non adherent to metformin due to diarrhea, does not check a blood sugar and stated that he has no meter. Outreach attempt #2. Left a HIPAA compliant message on the patient's voicemail. At this time will send patient outreach letter to attempt contact. If have not heard from patient by 11/15/17, will give him another call at that time.  [name], PharmD, [place] Management [phone]

## 2017-11-08 ENCOUNTER — Other Ambulatory Visit: Payer: Self-pay | Admitting: *Deleted

## 2017-11-08 NOTE — Patient Outreach (Signed)
[facility]) Care Management  11/08/2017  Charles Fox [birth year] [id number]   RN Health Coach attempted #1 follow up outreach call to patient.  Patient was unavailable. HIPPA compliance voicemail message left with return callback number.  Plan: RN will call patient again within 10 business days  [place] Management [phone]

## 2017-11-09 ENCOUNTER — Other Ambulatory Visit: Payer: Self-pay | Admitting: *Deleted

## 2017-11-09 NOTE — Patient Outreach (Signed)
[facility] Fox) Care Management  11/09/2017   Charles Fox [birth year] [id number]  RN Health Coach telephone call to patient.  Hipaa compliance verified. Per patient he has not picked up his meter that was ordered last month. Patient stated he is running low on insulin and can't afford to get any. The pharmacist had left message a message for the patient to call her back. Patient stated he has not called her. Patient stated he had not received the packet with the information [place] had sent. Patient stated that he is having problems eating because his teeth are rotten and falling out. Per patient he needs to have them pulled and he can"t afford it.   Patient has agreed to further outreach calls.   Current Medications:  Current Outpatient Medications  Medication Sig Dispense Refill  . aspirin 81 MG tablet Take 1 tablet (81 mg total) by mouth daily. 90 tablet 3  . atorvastatin (LIPITOR) 80 MG tablet Take 1 tablet (80 mg total) by mouth daily. 90 tablet 3  . chlorhexidine (PERIDEX) 0.12 % solution Use as directed 15 mLs in the mouth or throat 2 (two) times daily. 473 mL 3  . glucose blood (ONE TOUCH ULTRA TEST) test strip Check sugar twice daily, once before breakfast and once in the afternoon 100 each 3  . hydrOXYzine (ATARAX/VISTARIL) 10 MG tablet Take 1 tablet (10 mg total) by mouth 3 (three) times daily as needed. 30 tablet 0  . Insulin Pen Needle (UNIFINE PENTIPS) 31G X 5 MM MISC Use one to inject your insulin once daily 100 each 3  . Insulin Syringes, Disposable, U-100 0.3 ML MISC As directed 100 each 0  . Lancets (ONETOUCH ULTRASOFT) lancets Use as instructed 100 each 3  . LANTUS SOLOSTAR 100 UNIT/ML Solostar Pen Inject 40 Units into the skin daily at 10 pm. 1 pen 0  . lisinopril (PRINIVIL,ZESTRIL) 20 MG tablet Take 1 tablet (20 mg total) by mouth daily. 90 tablet 3  . metFORMIN (GLUCOPHAGE) 1000 MG tablet Take 1 tablet (1,000 mg total) by mouth 2 (two) times  daily with a meal. (Patient not taking: Reported on 10/18/2017) 180 tablet 3   No current facility-administered medications for this visit.     Functional Status:  In your present state of health, do you have any difficulty performing the following activities: 11/09/2017 10/18/2017  Hearing? N N  Vision? N N  Difficulty concentrating or making decisions? N N  Walking or climbing stairs? Y Y  Dressing or bathing? N N  Doing errands, shopping? N N  Preparing Food and eating ? N N  Using the Toilet? N N  In the past six months, have you accidently leaked urine? N N  Do you have problems with loss of bowel control? N N  Managing your Medications? N N  Managing your Finances? N N  Housekeeping or managing your Housekeeping? N N  Some recent data might be hidden    Fall/Depression Screening: Fall Risk  11/09/2017 10/18/2017 07/21/2017  Falls in the past year? No No No   PHQ 2/9 Scores 11/09/2017 10/18/2017 09/26/2017 07/21/2017 03/27/2017 12/22/2016 06/29/2016  PHQ - 2 Score 0 0 0 0 0 2 0   THN CM Care Plan Problem One     Most Recent Value  Care Plan Problem One  Knowledge Deficit in Self Management of Diabetes  Role Documenting the Problem One  Health Coach  [place] Term Goal   Patient will report a  decrease from 9.6  in his A1C within the next 90 days   Interventions for Problem One Long Term Goal  RN discussed with the patient about what the A1C is means. RN discussed with patient that his fasting blood sugar needs to be blow 130 for the A1C to be <7. RN sent EMMI educational material on Why get your A1C checked. RN will follow up outreach for further discussion and teach back.   THN CM Short Term Goal #1   Patient will understand and verbalize the signs and symptoms of hypo and hyperglycemia within the next 30 days  THN CM Short Term Goal #1 Start Date  11/09/17  Interventions for Short Term Goal #1  RN discussed the signs and symptoms of hyper and hypoglycemia. RN sent a picture chart of  faces to describe the symptoms. RN will follow up for further discussion an outreach.   THN CM Short Term Goal #2   Patient report checking and document blood sugars daily within the next 30 days  Interventions for Short Term Goal #2  RN reiterated the importance of checking his blood sugars. Dr placed order for meter, Patient has not picked up yet.. RN  resent patient a 2019 calendar book to document blood sugars. RN resent EMMI education on WHY and When to check your blood sugars RN will follow up with further discussion andout reach       Assessment:  Patient has not picked up meter Patient stated he is running low on insulin and can't afford any Patient is still smoking Patient stated he received some of the information than stated he had not received information sent by Health Coach Patient will benefit from Health Coach telephonic outreach for education and support for diabetes self management. Plan:  RN resent EMMI educational material on Why and when to check blood sugars RN resent EMMI educational material on  Why get you A1C checked RN resent EMMI educational material on  High and low blood sugars RN resent facial chart on hypo and hyperglycemia RN resent 2019 Calendar book for documenting RN resent Living well with diabetes RN told patient to check with his benefits RN sent a list of Dental agencies that give free or discounted services RN told patient to call the pharmacist back regarding medications [place] will follow up within the month of March  [place] Management [phone]

## 2017-11-10 ENCOUNTER — Encounter: Payer: Self-pay | Admitting: Pharmacist

## 2017-11-10 ENCOUNTER — Other Ambulatory Visit: Payer: Self-pay | Admitting: Pharmacist

## 2017-11-10 DIAGNOSIS — E119 Type 2 diabetes mellitus without complications: Secondary | ICD-10-CM

## 2017-11-10 DIAGNOSIS — Z794 Long term (current) use of insulin: Principal | ICD-10-CM

## 2017-11-10 MED ORDER — LANTUS SOLOSTAR 100 UNIT/ML ~~LOC~~ SOPN: 40.0000 [IU] | PEN_INJECTOR | Freq: Every day | SUBCUTANEOUS | 11 refills | Status: DC

## 2017-11-10 NOTE — Patient Outreach (Signed)
[place] [facility]) Care Management  11/10/2017  Charles Fox September 23, 1950 [id number]  Receive an [name] message from [facility].letting me know that "patient states he is almost out of insulin and can't afford any only has enough for a couple of days." Called and spoke with patient. HIPAA identifiers verified and verbal consent received.  Charles Fox reports that he is currently out of his Lantus. Reports that he was just about to drive up to his doctor at [facility] to get some samples of his insulin. Advise patient to call before driving to his doctor's office. Patient reports that he is having difficulty with affording his insulin this week because he does not receive his monthly check until the first of the month. Discuss with patient the importance of budgeting out and setting aside money from his paycheck each month for his prescriptions. Patient verbalizes understanding and agrees. Patient denies having extra help from [employer] with the cost of his medications. Reports that he is interested in applying for extra help.   PLAN  1) Patient to call now to follow up with his PCP office to request samples of Lantus insulin to last him until he's able to afford to pick up this prescription from his pharmacy.  2) Will call to follow up with Charles Fox this afternoon regarding the extra help application, his medication adherence and regarding his obtaining a blood glucose meter, as he has previously discussed with RNCM.  [name], PharmD, [place] Management [phone]

## 2017-11-10 NOTE — Progress Notes (Signed)
Patient arrived in office stating he was out of "lantus" and requested support until he can pay for his insulin with his check at the first of the month.   Medication Samples have been provided to the patient.  Drug name: [name]       Strength: 100units/ml        Qty: 1  LOT: [id number]  Exp.Date: 07/20/2019  Dosing instructions: 40 units daily  The patient has been instructed regarding the correct time, dose, and frequency of taking this medication, including desired effects and most common side effects.   [name] 2:08 PM 11/10/2017  Patient educated on purpose, proper use and potential adverse effects of Tresiba (insulin degludec).  Following instruction patient verbalized understanding of treatment plan.    New Rx for Lantus sent to [place].  4 pens - month supply with 11 refills.

## 2017-11-10 NOTE — Patient Outreach (Signed)
Call to follow up with Charles Fox this afternoon as planned regarding the extra help application, his medication adherence and regarding his obtaining a blood glucose meter, as he has previously discussed with RNCM. Left a HIPAA compliant message on the patient's voicemail. If have not heard from patient by next week, will give him another call at that time.   [name], PharmD, [place] Management [phone]

## 2017-11-15 ENCOUNTER — Ambulatory Visit: Payer: Self-pay | Admitting: Pharmacist

## 2017-11-15 ENCOUNTER — Other Ambulatory Visit: Payer: Self-pay | Admitting: Pharmacist

## 2017-11-15 NOTE — Patient Outreach (Signed)
[place] [facility]) Care Management  11/15/2017  [name] August 16, 1951 [id number]  Call to follow up with Charles Fox regarding medication assistance, his medication adherence and regarding his obtaining a blood glucose meter, as he has previously discussed with RNCM.  Outreach attempt #2. Left a HIPAA compliant message on the patient's voicemail. At this time will send patient outreach letter to attempt contact. If have not heard from patient by 11/29/17, will give him another call at that time.  [name], PharmD, [place] Management [phone]

## 2017-11-17 ENCOUNTER — Other Ambulatory Visit: Payer: Self-pay | Admitting: Family Medicine

## 2017-11-17 DIAGNOSIS — Z794 Long term (current) use of insulin: Principal | ICD-10-CM

## 2017-11-17 DIAGNOSIS — E119 Type 2 diabetes mellitus without complications: Secondary | ICD-10-CM

## 2017-11-18 MED ORDER — LANTUS SOLOSTAR 100 UNIT/ML ~~LOC~~ SOPN: 40.0000 [IU] | PEN_INJECTOR | Freq: Every day | SUBCUTANEOUS | 11 refills | Status: DC

## 2017-11-20 MED FILL — LANTUS SOLOSTAR 100 UNITS/M: 100 | 30 days supply | Qty: 12 | Fill #0

## 2017-11-29 ENCOUNTER — Other Ambulatory Visit: Payer: Self-pay | Admitting: Pharmacist

## 2017-11-29 NOTE — Patient Outreach (Signed)
[place] [facility]) Care Management  11/29/2017  [name] 04-Feb-1951 [id number]    Call to follow up with patient's PCP's office regarding the patient's glucometer and supplies and regarding changing the patient to the extended release form of metformin for improved tolerability/adherence. Leave a message on voicemail. If have not heard back by 12/01/17, will call to follow up again at that time.  [name], PharmD, [place] Management

## 2017-11-29 NOTE — Patient Outreach (Signed)
[place]) Care Management  11/29/2017  Charles Fox 1950-11-18 [id number]   Call to follow up with Charles Fox regarding medication assistance, his medication adherence and regarding his obtaining a blood glucose meter, as he has previously discussed with RNCM. Outreach attempt #3. Speak with patient. HIPAA identifiers verified and verbal consent received.  Discuss again with Charles Fox the extra help application for assistance with the cost of his medications. Charles Fox reports that he would not qualify for the extra help based on his current income. Discuss with patient the option of applying for patient assistance through the manufacturer for his Lantus. Patient reports that he is not interested in this application because he does not believe that he will meet the out of pocket expenditure requirement. Confirms that he picked up his most recent refill of Lantus from his pharmacy.  Patient reports that he has still been unable to receive a blood glucose meter from his PCP office. Note per Epic chart review, RNCM Charles Fox called to the patient's PCP office on 10/18/17 to request that a prescription for a glucometer and supplies be sent into the patient's pharmacy. Let patient know that I will call to follow up regarding this request.  Patient reports that he is unable to be adherent to his prescribed dose of metformin due to stomach side effects, diarrhea. Patient reports that he discussed this issue with with his PCP at their last visit and let her know that he was not taking the prescription as currently directed. Reports that he is currently taking his metformin 500 mg (1/2 tablet) once daily and is able to tolerate this dose, but has not been able to tolerate increasing this dose further. Patient denies having tried extended release metformin in the past. Let patient know that I will call to follow up with his provider to request that he consider changing the  patient to the extended release form for improved tolerability/adherence.  PLAN  Will call to follow up with patient's PCP's office regarding the patient's glucometer and supplies and regarding changing the patient to the extended release form of metformin for improved tolerability/adherence.   [name], PharmD, [place] Management [phone]

## 2017-11-30 ENCOUNTER — Telehealth: Payer: Self-pay | Admitting: *Deleted

## 2017-11-30 NOTE — Telephone Encounter (Signed)
Returned call to [place] and lm for her to call back.  Advised she could leave detailed message on nurse line if needed. [place], Salome Spotted, CMA

## 2017-12-01 ENCOUNTER — Other Ambulatory Visit: Payer: Self-pay | Admitting: Pharmacist

## 2017-12-01 NOTE — Telephone Encounter (Signed)
Eliz[place]et- pharmacist with [place] management calling, wants to know if we can change patients Metformin to ER- pt not tolerating current metformin Rx. Also  Asking if we can send in a new Rx for a glucometer and supplies to [facility] mail order. Elizabeth's call back [phone] Wallace Cullens, RN

## 2017-12-01 NOTE — Patient Outreach (Signed)
[place] [facility]) Care Management  12/01/2017  Charles Fox 07-17-1951 [id number]    Call again to follow up with patient's PCP's office regarding the patient's glucometer and supplies and regarding changing the patient to the extended release form of metformin for improved tolerability/adherence. Leave a message on voicemail. Will also send an InBasket message to patient's PCP, Dr. Cyndia Skeeters. If have not heard back by next week, will call to follow up again at that time.  [name], PharmD, [place] [facility]

## 2017-12-02 ENCOUNTER — Other Ambulatory Visit: Payer: Self-pay | Admitting: Student

## 2017-12-02 DIAGNOSIS — Z794 Long term (current) use of insulin: Principal | ICD-10-CM

## 2017-12-02 DIAGNOSIS — E119 Type 2 diabetes mellitus without complications: Secondary | ICD-10-CM

## 2017-12-02 DIAGNOSIS — IMO0001 Reserved for inherently not codable concepts without codable children: Secondary | ICD-10-CM

## 2017-12-04 ENCOUNTER — Other Ambulatory Visit: Payer: Self-pay | Admitting: Student

## 2017-12-04 ENCOUNTER — Other Ambulatory Visit: Payer: Self-pay | Admitting: Pharmacist

## 2017-12-04 DIAGNOSIS — Z794 Long term (current) use of insulin: Principal | ICD-10-CM

## 2017-12-04 DIAGNOSIS — E119 Type 2 diabetes mellitus without complications: Secondary | ICD-10-CM

## 2017-12-04 DIAGNOSIS — IMO0001 Reserved for inherently not codable concepts without codable children: Secondary | ICD-10-CM

## 2017-12-04 MED ORDER — ALCOHOL SWABS PADS: MEDICATED_PAD | 11 refills | Status: DC

## 2017-12-04 MED ORDER — METFORMIN HCL ER (MOD) 1000 MG PO TB24: 1000.0000 mg | ORAL_TABLET | Freq: Every day | ORAL | 0 refills | Status: DC

## 2017-12-04 MED ORDER — ONETOUCH ULTRASOFT LANCETS MISC: 3 refills | Status: DC

## 2017-12-04 MED ORDER — BLOOD GLUCOSE MONITORING SUPPL DEVI: 0 refills | Status: DC

## 2017-12-04 MED ORDER — GLUCOSE BLOOD VI STRP: ORAL_STRIP | 3 refills | Status: DC

## 2017-12-04 NOTE — Progress Notes (Signed)
Opened by mistake.

## 2017-12-04 NOTE — Progress Notes (Signed)
Talked to [facility] from [facility] care management.  Sent metformin ER 1000 mg to [facility] per [facility] request.  Sent his diabetic supplies (device, test strips, lancets and alcohol swab) to [facility] mail order pharmacy.

## 2017-12-04 NOTE — Patient Outreach (Addendum)
[place] [facility]) Care Management  12/04/2017  Charles Fox August 25, 1951 [id number]  Incoming call from patient's PCP, Dr. Cyndia Skeeters. Dr. Cyndia Skeeters confirms that he is sending an e-prescription into [facility] for the patient for a new blood glucose meter and testing supplies. Also confirms that he will change the patient to the extended release form of metformin for improved tolerability/adherence, calling this into the patient's local pharmacy.  Call to follow up with [name] regarding his meter and metformin. Left a HIPAA compliant message on the patient's voicemail. If have not heard from patient by 12/06/17, will give him another call at that time.    [name], PharmD, [place] [facility] [phone]

## 2017-12-06 ENCOUNTER — Other Ambulatory Visit: Payer: Self-pay | Admitting: Pharmacist

## 2017-12-06 NOTE — Patient Outreach (Signed)
[facility] [facility]) Care Management  12/06/2017  Charles Fox [birth year] [id number]  Call to follow up with [name] regarding his meter and metformin. HIPAA identifiers verified and verbal consent received.  Let [name] know that Dr. Cyndia Skeeters called in a prescription for the extended release form of metformin to his pharmacy to make it more tolerable/reduce stomach side effects. Counsel patient to try taking this in place of his immediate release metformin. Advise patient to take this metformin with a meal.  Also let patient know that Dr. Cyndia Skeeters sent in a prescription for the patient into [facility] for the patient for a new blood glucose meter and testing supplies. Patient reports that he will call to follow up with [name] about this meter.  Patient denies any further medication questions/concerns at this time. Patient confirms that he has my phone number.  Will close pharmacy episode at this time.  [name], PharmD, [place] [facility] [phone]

## 2017-12-13 ENCOUNTER — Ambulatory Visit: Payer: Self-pay | Admitting: *Deleted

## 2017-12-15 ENCOUNTER — Other Ambulatory Visit: Payer: Self-pay

## 2017-12-18 ENCOUNTER — Telehealth: Payer: Self-pay

## 2017-12-18 DIAGNOSIS — E119 Type 2 diabetes mellitus without complications: Secondary | ICD-10-CM

## 2017-12-18 DIAGNOSIS — Z794 Long term (current) use of insulin: Principal | ICD-10-CM

## 2017-12-18 NOTE — Telephone Encounter (Signed)
Received fax from [facility]. One Touch Ultra is non-preferred.   Please send prescription for Accu-Chek Aviva or True Metrix meter and test strips which are the preferred alternatives. If you would like to do a PA for One Touch Ultra 2 please let me know. Danley Danker, RN [facility] RN)

## 2017-12-20 MED ORDER — GLUCOSE BLOOD VI STRP: ORAL_STRIP | 12 refills | Status: DC

## 2017-12-20 MED ORDER — TRUE METRIX METER W/DEVICE KIT: PACK | 0 refills | Status: DC

## 2017-12-20 MED ORDER — TRUE METRIX LEVEL 2 NORMAL VI SOLN: 11 refills | Status: DC

## 2017-12-20 NOTE — Telephone Encounter (Signed)
Sent Rx for True Metrix Meter, strips and control solution to [facility].

## 2018-01-04 ENCOUNTER — Other Ambulatory Visit: Payer: Self-pay | Admitting: *Deleted

## 2018-01-04 NOTE — Patient Outreach (Signed)
[place] [facility]) Care Management  01/04/2018   Charles Fox 24-Jun-1951 [id number]  RN Health Coach telephone call to patient.  Hipaa compliance verified.  per patient he has not checked his blood sugar today. Patient stated he doesn't like to check his blood sugars. RN discussed the importance of checking blood sugars and that patient is taking insulin. RN discussed ways that uncontrolled diabetes could affect the body. Per patient he has not been eating his meals regularly. He states sometimes he skips breakfast. Per patient he does not have enough insulin to last him until May. RN notified [place]. Patient stated that he walks a lot and he does not have a regular exercise program. RN discussed going to [facility] senior center since patient under 65 to exercise free and also to meet other people. Patient has agreed to follow up outreach calls.  Current Medications:  Current Outpatient Medications  Medication Sig Dispense Refill  . Alcohol Swabs PADS He is to check blood glucose 2-3 times a day 100 each 11  . aspirin 81 MG tablet Take 1 tablet (81 mg total) by mouth daily. 90 tablet 3  . atorvastatin (LIPITOR) 80 MG tablet Take 1 tablet (80 mg total) by mouth daily. 90 tablet 3  . Blood Glucose Calibration (TRUE METRIX LEVEL 2) Normal SOLN Use to check your blood glucose twice a day 1 each 11  . Blood Glucose Monitoring Suppl (TRUE METRIX METER) w/Device KIT Use to check your blood glucose twice a day 1 kit 0  . chlorhexidine (PERIDEX) 0.12 % solution Use as directed 15 mLs in the mouth or throat 2 (two) times daily. 473 mL 3  . glucose blood (TRUE METRIX BLOOD GLUCOSE TEST) test strip Use to check your blood glucose twice a day 100 each 12  . hydrOXYzine (ATARAX/VISTARIL) 10 MG tablet Take 1 tablet (10 mg total) by mouth 3 (three) times daily as needed. 30 tablet 0  . Insulin Pen Needle (UNIFINE PENTIPS) 31G X 5 MM MISC Use one to inject your insulin once daily 100 each 3  .  Insulin Syringes, Disposable, U-100 0.3 ML MISC As directed 100 each 0  . Lancets (ONETOUCH ULTRASOFT) lancets Use to check blood glucose 2-3 times a day 100 each 3  . LANTUS SOLOSTAR 100 UNIT/ML Solostar Pen Inject 40 Units into the skin daily at 10 pm. 5 pen 11  . lisinopril (PRINIVIL,ZESTRIL) 20 MG tablet Take 1 tablet (20 mg total) by mouth daily. 90 tablet 3  . metFORMIN (GLUMETZA) 1000 MG (MOD) 24 hr tablet Take 1 tablet (1,000 mg total) by mouth daily with breakfast. 90 tablet 0   No current facility-administered medications for this visit.     Functional Status:  In your present state of health, do you have any difficulty performing the following activities: 01/04/2018 11/09/2017  Hearing? N N  Vision? N N  Difficulty concentrating or making decisions? N N  Walking or climbing stairs? Y Y  Dressing or bathing? N N  Doing errands, shopping? N N  Preparing Food and eating ? N N  Using the Toilet? N N  In the past six months, have you accidently leaked urine? N N  Do you have problems with loss of bowel control? N N  Managing your Medications? N N  Managing your Finances? N N  Housekeeping or managing your Housekeeping? N N  Some recent data might be hidden    Fall/Depression Screening: Fall Risk  01/04/2018 11/09/2017 10/18/2017  Falls  in the past year? No No No   PHQ 2/9 Scores 01/04/2018 11/09/2017 10/18/2017 09/26/2017 07/21/2017 03/27/2017 12/22/2016  PHQ - 2 Score 0 0 0 0 0 0 2   THN CM Care Plan Problem One     Most Recent Value  Care Plan Problem One  Knowledge Deficit in Self Management of Diabetes  Role Documenting the Problem One  [place] for Problem One  Active  THN Long Term Goal   Patient will report a decrease from 9.6  in his A1C within the next 90 days   [place] Term Goal Start Date  01/04/18  Interventions for Problem One Long Term Goal  RN discussed the improtance of keeping dr appointment to get A1C down. RN discussed patient checking blood sugars.  RN will follow up with further discussion and teach back  THN CM Short Term Goal #1   Patient will understand and verbalize the signs and symptoms of hypo and hyperglycemia within the next 30 days  THN CM Short Term Goal #1 Met Date  01/04/18  [facility] CM Short Term Goal #2   Patient report checking and document blood sugars daily within the next 30 days  THN CM Short Term Goal #2 Start Date  01/04/18  Interventions for Short Term Goal #2  RN discussed with patient that he needs to know what his blood sugars are to be able to get better control especially since he is on insulin. RN will follow up for compliance  THN CM Short Term Goal #3  Patient will report better eating habits within the next 30 days  THN CM Short Term Goal #3 Start Date  01/04/18  Interventions for Short Tern Goal #3  RN discussed with patient about eating breakfast, lunch and dinner. RN discussed the importance of not skipping meals. RN will follow up for further discussion and teach back     Assessment:  Patient doesn't check blood sugars regularly Patient has a new meter and strips Patient is not eating meals regularly   Plan:  Patient is to check blood sugars and report to Health Coach next outreach Patient is to start eating breakfast, lunch an dinner Patient is to contact the [employer] [facility] [employer] senior center about programs RN notified pharmacy anout patient not having enough insulin to last till May RN will follow up outreach within the month of [place] [facility] [phone]

## 2018-01-10 ENCOUNTER — Other Ambulatory Visit: Payer: Self-pay | Admitting: Pharmacist

## 2018-01-10 NOTE — Patient Outreach (Signed)
[place] [facility]) Care Management  01/10/2018  Charles Fox June 05, 1951 [id number]   Receive an [name] message from [place] letting me know that Charles Fox reports that he will run out of his insulin before the end of the month, prior to when he gets paid.   Place a coordination of care call to [name]. Let [name] know that I previously outreached to Charles Fox in March regarding medication assistance options and that Charles Fox reported that he would not qualify for the extra help based on his current income and that he was not interested in applying for patient assistance through the manufacturer for his Lantus as he did not believe that he would meet the out of pocket expenditure requirement. At that time, discussed with patient the importance of budgeting in advance for his medication copayment. Advise [name] that per Epic, patient met with Dr. [name], RPh at his PCP's office regarding being without his Lantus and requesting support for paying for his insulin and Dr. [name] was able to provide patient with insulin samples and counseling to assist the patient. [name] states that she will reach back out to Charles Fox today and advise him to follow up again with Dr. [name] PCPs office again for support until he can afford his copayment.  In the afternoon receive an Google from [facility] letting me know that she was able to successfully reach the patient and that he reported that he would follow up with his PCP office next week.  [name], PharmD, [place] Management [phone]

## 2018-01-12 ENCOUNTER — Encounter (HOSPITAL_COMMUNITY): Payer: Self-pay

## 2018-01-12 ENCOUNTER — Emergency Department (HOSPITAL_COMMUNITY): Payer: Medicare HMO

## 2018-01-12 ENCOUNTER — Emergency Department (HOSPITAL_COMMUNITY)
Admission: EM | Admit: 2018-01-12 | Discharge: 2018-01-13 | Disposition: A | Discharge status: Home / Self Care | Payer: Medicare HMO | Attending: Emergency Medicine | Admitting: Emergency Medicine

## 2018-01-12 ENCOUNTER — Other Ambulatory Visit: Payer: Self-pay

## 2018-01-12 DIAGNOSIS — M7989 Other specified soft tissue disorders: Secondary | ICD-10-CM | POA: Diagnosis not present

## 2018-01-12 DIAGNOSIS — M79645 Pain in left finger(s): Secondary | ICD-10-CM | POA: Diagnosis not present

## 2018-01-12 DIAGNOSIS — Z794 Long term (current) use of insulin: Secondary | ICD-10-CM | POA: Diagnosis not present

## 2018-01-12 DIAGNOSIS — F17210 Nicotine dependence, cigarettes, uncomplicated: Secondary | ICD-10-CM | POA: Insufficient documentation

## 2018-01-12 DIAGNOSIS — E119 Type 2 diabetes mellitus without complications: Secondary | ICD-10-CM | POA: Insufficient documentation

## 2018-01-12 DIAGNOSIS — Y999 Unspecified external cause status: Secondary | ICD-10-CM | POA: Insufficient documentation

## 2018-01-12 DIAGNOSIS — S0990XA Unspecified injury of head, initial encounter: Secondary | ICD-10-CM | POA: Diagnosis present

## 2018-01-12 DIAGNOSIS — Y939 Activity, unspecified: Secondary | ICD-10-CM | POA: Diagnosis not present

## 2018-01-12 DIAGNOSIS — Y929 Unspecified place or not applicable: Secondary | ICD-10-CM | POA: Insufficient documentation

## 2018-01-12 DIAGNOSIS — Z7982 Long term (current) use of aspirin: Secondary | ICD-10-CM | POA: Insufficient documentation

## 2018-01-12 DIAGNOSIS — I10 Essential (primary) hypertension: Secondary | ICD-10-CM | POA: Diagnosis not present

## 2018-01-12 DIAGNOSIS — S0003XA Contusion of scalp, initial encounter: Secondary | ICD-10-CM | POA: Insufficient documentation

## 2018-01-12 DIAGNOSIS — Z79899 Other long term (current) drug therapy: Secondary | ICD-10-CM | POA: Insufficient documentation

## 2018-01-12 DIAGNOSIS — S6992XA Unspecified injury of left wrist, hand and finger(s), initial encounter: Secondary | ICD-10-CM | POA: Diagnosis not present
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
## 2018-01-13 ENCOUNTER — Emergency Department (HOSPITAL_COMMUNITY): Payer: Medicare HMO

## 2018-01-13 DIAGNOSIS — S0003XA Contusion of scalp, initial encounter: Secondary | ICD-10-CM | POA: Diagnosis not present

## 2018-01-13 MED ORDER — ACETAMINOPHEN 325 MG PO TABS
650.0000 mg | ORAL_TABLET | Freq: Once | ORAL | Status: AC
  Administered 2018-01-13: 650 mg via ORAL
  Filled 2018-01-13: qty 2

## 2018-01-13 MED ORDER — ACETAMINOPHEN 500 MG PO TABS: 500.0000 mg | ORAL_TABLET | Freq: Four times a day (QID) | ORAL | 0 refills | Status: DC | PRN
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
## 2018-01-13 NOTE — ED Notes (Signed)
Patient transported to CT 

## 2018-01-13 NOTE — Discharge Instructions (Signed)
Use ice on your head and thumb 3-4 times daily alternating 20 minutes on, 20 minutes off.  You can take Tylenol as prescribed as needed for headache.  Please follow-up with your doctor next week for recheck.  Please return to the emergency department if you develop any new or worsening symptoms.

## 2018-01-19 ENCOUNTER — Ambulatory Visit: Payer: Medicare HMO | Admitting: Student

## 2018-01-19 MED FILL — LANTUS SOLOSTAR 100 UNITS/M: 100 | 30 days supply | Qty: 12 | Fill #1

## 2018-01-26 ENCOUNTER — Ambulatory Visit: Payer: Medicare HMO | Admitting: Student

## 2018-02-14 ENCOUNTER — Ambulatory Visit: Payer: Self-pay | Admitting: *Deleted

## 2018-02-20 ENCOUNTER — Other Ambulatory Visit: Payer: Self-pay | Admitting: *Deleted

## 2018-02-20 NOTE — Patient Outreach (Signed)
[place] [facility]) Care Management  02/20/2018  CARI VANDEBERG 1951/09/04 [id number]   RN Health Coach attempted follow up outreach call to patient.  Patient was unavailable. Unable to leave Per voicemail message mailbox is full. Plan: RN will call patient again within 14 days.  [place] [facility] [phone]

## 2018-02-28 ENCOUNTER — Encounter: Payer: Medicare PPO | Admitting: Student

## 2018-03-06 ENCOUNTER — Encounter (HOSPITAL_COMMUNITY): Payer: Self-pay | Admitting: Emergency Medicine

## 2018-03-06 ENCOUNTER — Ambulatory Visit (HOSPITAL_COMMUNITY)
Admission: EM | Admit: 2018-03-06 | Discharge: 2018-03-06 | Disposition: A | Discharge status: Home / Self Care | Payer: Medicare PPO | Attending: Internal Medicine | Admitting: Internal Medicine

## 2018-03-06 ENCOUNTER — Other Ambulatory Visit: Payer: Self-pay | Admitting: *Deleted

## 2018-03-06 DIAGNOSIS — R739 Hyperglycemia, unspecified: Secondary | ICD-10-CM

## 2018-03-06 DIAGNOSIS — K0889 Other specified disorders of teeth and supporting structures: Secondary | ICD-10-CM

## 2018-03-06 DIAGNOSIS — E1165 Type 2 diabetes mellitus with hyperglycemia: Secondary | ICD-10-CM | POA: Diagnosis not present

## 2018-03-06 LAB — GLUCOSE, CAPILLARY: Glucose-Capillary: 207 mg/dL — ABNORMAL HIGH (ref 65–99)

## 2018-03-06 MED ORDER — BENZOCAINE 10 % MT GEL: 1.0000 "application " | OROMUCOSAL | 0 refills | Status: DC | PRN

## 2018-03-06 MED ORDER — IBUPROFEN 800 MG PO TABS: 800.0000 mg | ORAL_TABLET | Freq: Three times a day (TID) | ORAL | 0 refills | Status: DC

## 2018-03-06 NOTE — Discharge Instructions (Addendum)
Blood Sugar was 207 today- please pick up new meter on July 1 st. Please take 40 units as prescribed   Please use dental resource to contact offices to seek permenant treatment/relief.   Today we have given you an antibiotic. This should help with pain as any infection is cleared. For pain please take 600mg -800mg  of Ibuprofen every 8 hours, take with 1000 mg of Tylenol Extra strength every 8 hours. These are safe to take together. Please take with food.   Use oragel  Please return if you start to experience significant swelling of your face, experiencing fever.

## 2018-03-06 NOTE — ED Provider Notes (Signed)
[facility]    CSN: [id number] Arrival date & time: 03/06/18  1659     History   Chief Complaint Chief Complaint  Patient presents with  . Dental Pain  . Diabetes    HPI Charles Fox is a 67 y.o. male history of hypertension, DM type II presenting today for evaluation of a toothache as well as for blood sugar check.  Patient states that for the past 2 days he has had a toothache.  He has been applying something he got from the store, but he is unsure what this was.  He is also taking Bayer aspirin 81 mg.  Denies swelling.  Having difficulty finding a dentist that will remove his teeth.  He is wanting to have them all taken out and get dentures.  Patient also wanting to have his blood sugar checked as he has lost his blood glucose monitor that he uses at home.  He is prescribed 40 units of insulin Lantus daily, but he only takes 30 as it makes her very sleepy.  States prior to losing his meter a couple months ago he was running 140s/150s.  Denies nausea, vomiting, fatigue, dizziness or lightheadedness.  States that he does feel slightly weak.  Denies fevers.  HPI  Past Medical History:  Diagnosis Date  . Diabetes mellitus without complication ([place])   . Hypertension     Patient Active Problem List   Diagnosis Date Noted  . Routine adult health maintenance 03/27/2017  . Hypercholesterolemia 12/22/2016  . Urine test positive for microalbuminuria 05/27/2016  . Diabetes mellitus ([place]) 09/04/2014  . Morbid obesity ([place]) 09/04/2014  . HTN 09/04/2014  . Tobacco use disorder 09/04/2014    Past Surgical History:  Procedure Laterality Date  . BACK SURGERY    . FINGER SURGERY         Home Medications    Prior to Admission medications   Medication Sig Start Date End Date Taking? Authorizing Provider  aspirin 81 MG tablet Take 1 tablet (81 mg total) by mouth daily. 11/21/14  Yes Patrecia Pour, MD  LANTUS SOLOSTAR 100 UNIT/ML Solostar Pen Inject 40 Units into the  skin daily at 10 pm. 11/18/17  Yes Mercy Riding, MD  lisinopril (PRINIVIL,ZESTRIL) 20 MG tablet Take 1 tablet (20 mg total) by mouth daily. 12/22/16  Yes Zenia Resides, MD  Alcohol Swabs PADS He is to check blood glucose 2-3 times a day 12/04/17   Mercy Riding, MD  atorvastatin (LIPITOR) 80 MG tablet Take 1 tablet (80 mg total) by mouth daily. 12/22/16   Zenia Resides, MD  benzocaine (ORAJEL) 10 % mucosal gel Use as directed 1 application in the mouth or throat as needed for mouth pain. 03/06/18   Wieters, Hallie C, PA-C  Blood Glucose Calibration (TRUE METRIX LEVEL 2) Normal SOLN Use to check your blood glucose twice a day 12/20/17   Mercy Riding, MD  Blood Glucose Monitoring Suppl (TRUE METRIX METER) w/Device KIT Use to check your blood glucose twice a day 12/20/17   Mercy Riding, MD  chlorhexidine (PERIDEX) 0.12 % solution Use as directed 15 mLs in the mouth or throat 2 (two) times daily. 07/21/17   Mercy Riding, MD  glucose blood (TRUE METRIX BLOOD GLUCOSE TEST) test strip Use to check your blood glucose twice a day 12/20/17   Mercy Riding, MD  hydrOXYzine (ATARAX/VISTARIL) 10 MG tablet Take 1 tablet (10 mg total) by mouth 3 (three) times daily  as needed. 10/22/15   Patrecia Pour, MD  ibuprofen (ADVIL,MOTRIN) 800 MG tablet Take 1 tablet (800 mg total) by mouth 3 (three) times daily. 03/06/18   Wieters, Hallie C, PA-C  Insulin Pen Needle (UNIFINE PENTIPS) 31G X 5 MM MISC Use one to inject your insulin once daily 09/29/17   Mercy Riding, MD  Insulin Syringes, Disposable, U-100 0.3 ML MISC As directed 12/23/14   Presson, Annett Gula H, PA  Lancets [facility] ULTRASOFT) lancets Use to check blood glucose 2-3 times a day 12/04/17   Mercy Riding, MD  metFORMIN (GLUMETZA) 1000 MG (MOD) 24 hr tablet Take 1 tablet (1,000 mg total) by mouth daily with breakfast. 12/04/17   Mercy Riding, MD    Family History No family history on file.  Social History Social History   Tobacco Use  . Smoking status: Current  Every Day Smoker    Packs/day: 0.50    Types: Cigarettes  . Smokeless tobacco: Never Used  Substance Use Topics  . Alcohol use: No  . Drug use: Not on file     Allergies   Patient has no known allergies.   Review of Systems Review of Systems  Constitutional: Negative for activity change, appetite change, fatigue and fever.  HENT: Positive for dental problem. Negative for ear pain, postnasal drip, rhinorrhea, sinus pressure and sore throat.   Eyes: Negative for pain and itching.  Respiratory: Negative for cough and shortness of breath.   Cardiovascular: Negative for chest pain.  Gastrointestinal: Negative for abdominal pain, diarrhea, nausea and vomiting.  Musculoskeletal: Negative for myalgias.  Skin: Negative for rash.  Neurological: Negative for dizziness, light-headedness and headaches.     Physical Exam Triage Vital Signs ED Triage Vitals  Enc Vitals Group     BP 03/06/18 1722 (!) 161/88     Pulse Rate 03/06/18 1722 96     Resp 03/06/18 1722 16     Temp 03/06/18 1722 99.4 F (37.4 C)     Temp Source 03/06/18 1722 Oral     SpO2 03/06/18 1722 97 %     Weight 03/06/18 1723 260 lb (117.9 kg)     Height --      Head Circumference --      Peak Flow --      Pain Score 03/06/18 1722 9     Pain Loc --      Pain Edu? --      Excl. in [place]? --    No data found.  Updated Vital Signs BP (!) 161/88   Pulse 96   Temp 99.4 F (37.4 C) (Oral)   Resp 16   Wt 260 lb (117.9 kg)   SpO2 97%   BMI 39.53 kg/m   Visual Acuity Right Eye Distance:   Left Eye Distance:   Bilateral Distance:    Right Eye Near:   Left Eye Near:    Bilateral Near:     Physical Exam  Constitutional: He appears well-developed and well-nourished.  HENT:  Head: Normocephalic and atraumatic.  Overall poor dentition, tenderness to the gingiva surrounding right lower jaw teeth, most of teeth largely missing.  No tenderness to palpation of palate below the tongue.  No facial swelling  Eyes:  Conjunctivae are normal.  Neck: Neck supple.  Cardiovascular: Normal rate and regular rhythm.  No murmur heard. Pulmonary/Chest: Effort normal and breath sounds normal. No respiratory distress.  Abdominal: Soft. There is no tenderness.  Musculoskeletal: He exhibits no edema.  Neurological:  He is alert.  Skin: Skin is warm and dry.  Psychiatric: He has a normal mood and affect.  Nursing note and vitals reviewed.    UC Treatments / Results  Labs (all labs ordered are listed, but only abnormal results are displayed) Labs Reviewed  GLUCOSE, CAPILLARY - Abnormal; Notable for the following components:      Result Value   Glucose-Capillary 207 (*)    All other components within normal limits    EKG None  Radiology No results found.  Procedures Procedures (including critical care time)  Medications Ordered in UC Medications - No data to display  Initial Impression / Assessment and Plan / UC Course  I have reviewed the triage vital signs and the nursing notes.  Pertinent labs & imaging results that were available during my care of the patient were reviewed by me and considered in my medical decision making (see chart for details).     Blood sugar 207, will advise patient to take the prescribed dose of 40 units until he is able to pick up his new glucose meter on July 1.  Follow-up with PCP.  Provided ibuprofen for dental pain, no signs of infection at this time.  Also provided Orajel as he was requesting a topical cream.  Dental resource provided.Discussed strict return precautions. Patient verbalized understanding and is agreeable with plan.  Final Clinical Impressions(s) / UC Diagnoses   Final diagnoses:  Pain, dental  Blood glucose elevated     Discharge Instructions     Blood Sugar was 207 today- please pick up new meter on July 1 st. Please take 40 units as prescribed   Please use dental resource to contact offices to seek permenant treatment/relief.   Today  we have given you an antibiotic. This should help with pain as any infection is cleared. For pain please take 673m-800mg of Ibuprofen every 8 hours, take with 1000 mg of Tylenol Extra strength every 8 hours. These are safe to take together. Please take with food.   Use oragel  Please return if you start to experience significant swelling of your face, experiencing fever.    ED Prescriptions    Medication Sig Dispense Auth. Provider   ibuprofen (ADVIL,MOTRIN) 800 MG tablet Take 1 tablet (800 mg total) by mouth 3 (three) times daily. 21 tablet Wieters, Hallie C, PA-C   benzocaine (ORAJEL) 10 % mucosal gel Use as directed 1 application in the mouth or throat as needed for mouth pain. 5.3 g Wieters, [place], PA-C     Controlled Substance Prescriptions [place] Controlled Substance Registry consulted? Not Applicable   WJanith Lima PVermont06/18/19 1759

## 2018-03-06 NOTE — ED Triage Notes (Signed)
PT reports dental pain for a few months.  PT also requests glucose check. PT takes insulin once a day. PT is supposed to take 40 units, but chooses to take 30 because 40 "knocks him out." PT lost glucometer several weeks ago.

## 2018-03-07 NOTE — Patient Outreach (Addendum)
[facility]) Care Management  [phone]  BRIEN LOWE [birth year] [phone] RN Health Coach telephone call to patient.  [employer] compliance verified. Per patient he does not know what his blood sugar is. Per patient he had to check one of his family members and he hated doing that to him. Patient stated he hated to stick himself.. Patient stated that he wasn't feeling good. Patient stated his tooth was hurting and that he had went to a dentist and they wouldn't take him. Per patient he was feeling thirsty, weak, drowsy but hungry. Patient was describing symptoms of both hypo and hyperglycemia. RN told patient to go get his meter now and lets see what his blood sugar is. Patient stated he couldn't find it. Patient asked couldn't he get one of those meter you don't have to stick yourself all the time. RN explained that they need to see what your numbers are, That you are checking your blood sugars and how often before you can get one.  Patient stated, "Can't he go to the Dr office or Emergency room to see what it is? RN told patient he needs to check it. Patient then stated he has not been taking his insulin that he had run out and did not have any money to go buy some. RN asked patient has he called his physician to see if they have any samples. Patient stated no he hated to beg them for some. RN explained that he needed to call his physician. Patient stated his tooth hurt so bad that he was going to the ED. RN explained to patient to utilize the urgent care not the ED. Patient stated they will check my blood sugar also.   Current Medications:  Current Outpatient Medications  Medication Sig Dispense Refill  . Alcohol Swabs PADS He is to check blood glucose 2-3 times a day 100 each 11  . aspirin 81 MG tablet Take 1 tablet (81 mg total) by mouth daily. 90 tablet 3  . atorvastatin (LIPITOR) 80 MG tablet Take 1 tablet (80 mg total) by mouth daily. 90 tablet 3  . benzocaine (ORAJEL) 10 %  mucosal gel Use as directed 1 application in the mouth or throat as needed for mouth pain. 5.3 g 0  . Blood Glucose Calibration (TRUE METRIX LEVEL 2) Normal SOLN Use to check your blood glucose twice a day 1 each 11  . Blood Glucose Monitoring Suppl (TRUE METRIX METER) w/Device KIT Use to check your blood glucose twice a day 1 kit 0  . chlorhexidine (PERIDEX) 0.12 % solution Use as directed 15 mLs in the mouth or throat 2 (two) times daily. 473 mL 3  . glucose blood (TRUE METRIX BLOOD GLUCOSE TEST) test strip Use to check your blood glucose twice a day 100 each 12  . hydrOXYzine (ATARAX/VISTARIL) 10 MG tablet Take 1 tablet (10 mg total) by mouth 3 (three) times daily as needed. 30 tablet 0  . ibuprofen (ADVIL,MOTRIN) 800 MG tablet Take 1 tablet (800 mg total) by mouth 3 (three) times daily. 21 tablet 0  . Insulin Pen Needle (UNIFINE PENTIPS) 31G X 5 MM MISC Use one to inject your insulin once daily 100 each 3  . Insulin Syringes, Disposable, U-100 0.3 ML MISC As directed 100 each 0  . Lancets (ONETOUCH ULTRASOFT) lancets Use to check blood glucose 2-3 times a day 100 each 3  . LANTUS SOLOSTAR 100 UNIT/ML Solostar Pen Inject 40 Units into the skin daily at 10 pm.  5 pen 11  . lisinopril (PRINIVIL,ZESTRIL) 20 MG tablet Take 1 tablet (20 mg total) by mouth daily. 90 tablet 3  . metFORMIN (GLUMETZA) 1000 MG (MOD) 24 hr tablet Take 1 tablet (1,000 mg total) by mouth daily with breakfast. 90 tablet 0   No current facility-administered medications for this visit.     Functional Status:  In your present state of health, do you have any difficulty performing the following activities: 03/06/2018 01/04/2018  Hearing? N N  Vision? N N  Difficulty concentrating or making decisions? N N  Walking or climbing stairs? Y Y  Dressing or bathing? N N  Doing errands, shopping? N N  Preparing Food and eating ? N N  Using the Toilet? N N  In the past six months, have you accidently leaked urine? N N  Do you have  problems with loss of bowel control? N N  Managing your Medications? N N  Managing your Finances? N N  Housekeeping or managing your Housekeeping? N N  Some recent data might be hidden    Fall/Depression Screening: Fall Risk  03/06/2018 01/04/2018 11/09/2017  Falls in the past year? No No No   PHQ 2/9 Scores 03/06/2018 01/04/2018 11/09/2017 10/18/2017 09/26/2017 07/21/2017 03/27/2017  PHQ - 2 Score 0 0 0 0 0 0 0   THN CM Care Plan Problem One     Most Recent Value  Care Plan Problem One  Knowledge Deficit in Self Management of Diabetes  Role Documenting the Problem One  [place] for Problem One  Active  THN Long Term Goal   Patient will report a decrease from 9.6  in his A1C within the next 90 days   THN Long Term Goal Start Date  03/07/18  Interventions for Problem One Long Term Goal  RN reiterated the importance of checking blood sugars, eating the appropiate diet and medication adherence to make a difference in the A1C. RN will follow up with further discussion and teachback  THN CM Short Term Goal #1   Patient will understand and verbalize the signs and symptoms of hypo and hyperglycemia within the next 30 days  THN CM Short Term Goal #1 Start Date  03/07/18  Interventions for Short Term Goal #1  RN discussed the patient symptoms he was experiencing Patient was unable to tell if his blood sugar was elevated or low. Patient had not checked his blood sugar also.  THN CM Short Term Goal #2   Patient report checking and document blood sugars daily within the next 30 days  THN CM Short Term Goal #2 Start Date  03/07/18  Interventions for Short Term Goal #2  RN reiterated with the patient the importance of checking his blood sugars. RN explined that if he didn't write the blood sugars down he could take his meter and the Dr can see how often he is checking them and what they are. RN will follow up for compliance  THN CM Short Term Goal #3  Patient will report better eating habits within  the next 30 days  THN CM Short Term Goal #3 Start Date  03/07/18  Interventions for Short Tern Goal #3  RN reiterated eating healthy. Patient has dental issues that he needs addressed. RN discussed food that are soft that patient could eat. RN will follow up for compliance       Assessment:  Patient is not checking blood sugars Per patient he has lost his new meter Patient is still  smoking Patient is not taking his insulin as per ordered Patient has multiple dental cavities Patient does not exercise Plan:  RN discussed importance of checking blood sugar RN discussed importance of documenting Blood sugars or taking meter to DR RN discussed getting dental care RN discussed patient not going to ED versus urgent care RN told patient to check Dr office for samples RN will follow up within the month of July    [place] Hamor [facility] Management [phone]

## 2018-03-13 ENCOUNTER — Telehealth: Payer: Self-pay | Admitting: *Deleted

## 2018-03-13 DIAGNOSIS — Z794 Long term (current) use of insulin: Principal | ICD-10-CM

## 2018-03-13 DIAGNOSIS — E119 Type 2 diabetes mellitus without complications: Secondary | ICD-10-CM

## 2018-03-13 MED ORDER — LANTUS SOLOSTAR 100 UNIT/ML ~~LOC~~ SOPN: 40.0000 [IU] | PEN_INJECTOR | Freq: Every day | SUBCUTANEOUS | 0 refills | Status: DC

## 2018-03-13 NOTE — Telephone Encounter (Signed)
Pt is unable to afford Lantus until July 1st.  Gave 2 samples until then.  Appt made with Dr. Lindell Noe for 03/19/18. Fleeger, Salome Spotted, CMA

## 2018-03-19 ENCOUNTER — Ambulatory Visit: Payer: Medicare PPO | Admitting: Family Medicine

## 2018-03-26 ENCOUNTER — Encounter: Payer: Self-pay | Admitting: Family Medicine

## 2018-03-26 ENCOUNTER — Ambulatory Visit (INDEPENDENT_AMBULATORY_CARE_PROVIDER_SITE_OTHER): Payer: Medicare Other | Admitting: Family Medicine

## 2018-03-26 ENCOUNTER — Other Ambulatory Visit: Payer: Self-pay

## 2018-03-26 VITALS — BP 142/76 | HR 81 | Temp 98.1°F | Ht 68.0 in | Wt 261.4 lb

## 2018-03-26 DIAGNOSIS — E7800 Pure hypercholesterolemia, unspecified: Secondary | ICD-10-CM | POA: Diagnosis not present

## 2018-03-26 DIAGNOSIS — Z794 Long term (current) use of insulin: Secondary | ICD-10-CM

## 2018-03-26 DIAGNOSIS — E119 Type 2 diabetes mellitus without complications: Secondary | ICD-10-CM | POA: Diagnosis not present

## 2018-03-26 DIAGNOSIS — I10 Essential (primary) hypertension: Secondary | ICD-10-CM | POA: Diagnosis not present

## 2018-03-26 LAB — POCT GLYCOSYLATED HEMOGLOBIN (HGB A1C): HBA1C, POC (CONTROLLED DIABETIC RANGE): 10.7 % — AB (ref 0.0–7.0)

## 2018-03-26 MED ORDER — EMPAGLIFLOZIN 10 MG PO TABS: 10.0000 mg | ORAL_TABLET | Freq: Every day | ORAL | 2 refills | Status: DC

## 2018-03-26 NOTE — Patient Instructions (Signed)
It was a pleasure to see you today! Thank you for choosing [facility] for your primary care. Charles Fox was seen for diabetes.   Our plans for today were:  Pick up the new diabetes pill medicine. Stop the metformin. Keep your lantus the same.   I will call the pharmacist about med costs.   I will call you if your labs are abnormal.   Please restart your cholesterol medicine and your blood pressure.   You should return to our clinic to see Dr. Lindell Noe in 3 months for diabetes.   Best,  Dr. Lindell Noe

## 2018-03-26 NOTE — Progress Notes (Signed)
   CC: DM, HTN  HPI  DM - metformin gave him diarrhea, which is new. Used to tolerate this well. If he takes it he has to stay home because of diarrhea. Eats oatmeal in the morning. Not checking CBGs since he states it bleeds too much. Uses lantus 40U once per day.   HTN - hasn't taken any BP meds in a while. Ate sausage this morning, which he thinks elevated his BP.   HLD - not sure what atorvastatin is. Definitely not taking it.   Tobacco use - about 2-3 cigarettes per day.   ROS: Denies CP, SOB, abdominal pain, dysuria, changes in BMs.   CC, SH/smoking status, and VS noted  Objective: BP (!) 142/76   Pulse 81   Temp 98.1 F (36.7 C) (Oral)   Ht 5\' 8"  (1.727 m)   Wt 261 lb 6.4 oz (118.6 kg)   SpO2 97%   BMI 39.75 kg/m  Gen: NAD, alert, cooperative, and pleasant. HEENT: NCAT, EOMI, PERRL CV: RRR, no murmur Resp: CTAB, no wheezes, non-labored Ext: No edema, warm Neuro: Alert and oriented, Speech clear, No gross deficits  Assessment and plan:  HTN Restart ACEi given DM. Last Cr was normal. Will restart and plan for repeat BMP in 10 days.   Diabetes mellitus Worsening control, patient has not been taking metformin due to GI upset.  Will start Jardiance at low dose, and titrate as able.  Also message to 10 pharmacist to help with med affordability versus co-pay cards.  Additionally, he needs a pillbox as he is unsure about his medicines and does not use one.  Continue Lantus at current dose, given current control hypoglycemia with addition of Jardiance is less likely.  Return in 3 months  Hypercholesterolemia Recheck lipids, likely will need restart of atorvastatin.  ASCVD risk based on last lipid panel is 52%.   Orders Placed This Encounter  Procedures  . CBC  . Basic metabolic panel  . Lipid panel  . HgB A1c    Meds ordered this encounter  Medications  . empagliflozin (JARDIANCE) 10 MG TABS tablet    Sig: Take 10 mg by mouth daily.    Dispense:  30 tablet   Refill:  2    Please substitute generic if cost is more affordable    Ralene Ok, MD, PGY3 03/28/2018 10:16 AM

## 2018-03-27 LAB — LIPID PANEL
CHOL/HDL RATIO: 4.5 ratio (ref 0.0–5.0)
CHOLESTEROL TOTAL: 201 mg/dL — AB (ref 100–199)
HDL: 45 mg/dL (ref >39)
LDL Calculated: 132 mg/dL — ABNORMAL HIGH (ref 0–99)
Triglycerides: 121 mg/dL (ref 0–149)
VLDL Cholesterol Cal: 24 mg/dL (ref 5–40)

## 2018-03-27 LAB — CBC
Hematocrit: 45.8 % (ref 37.5–51.0)
Hemoglobin: 15.5 g/dL (ref 13.0–17.7)
MCH: 30 pg (ref 26.6–33.0)
MCHC: 33.8 g/dL (ref 31.5–35.7)
MCV: 89 fL (ref 79–97)
Platelets: 266 10*3/uL (ref 150–450)
RBC: 5.16 x10E6/uL (ref 4.14–5.80)
RDW: 14.1 % (ref 12.3–15.4)
WBC: 9.3 10*3/uL (ref 3.4–10.8)

## 2018-03-27 LAB — BASIC METABOLIC PANEL
BUN / CREAT RATIO: 11 (ref 10–24)
BUN: 10 mg/dL (ref 8–27)
CO2: 22 mmol/L (ref 20–29)
CREATININE: 0.87 mg/dL (ref 0.76–1.27)
Calcium: 8.9 mg/dL (ref 8.6–10.2)
Chloride: 100 mmol/L (ref 96–106)
GFR calc Af Amer: 105 mL/min/{1.73_m2} (ref >59)
GFR calc non Af Amer: 91 mL/min/{1.73_m2} (ref >59)
GLUCOSE: 248 mg/dL — AB (ref 65–99)
POTASSIUM: 4.5 mmol/L (ref 3.5–5.2)
SODIUM: 135 mmol/L (ref 134–144)

## 2018-03-28 ENCOUNTER — Telehealth: Payer: Self-pay | Admitting: *Deleted

## 2018-03-28 DIAGNOSIS — E119 Type 2 diabetes mellitus without complications: Secondary | ICD-10-CM

## 2018-03-28 DIAGNOSIS — I10 Essential (primary) hypertension: Secondary | ICD-10-CM

## 2018-03-28 DIAGNOSIS — Z794 Long term (current) use of insulin: Principal | ICD-10-CM

## 2018-03-28 NOTE — Telephone Encounter (Signed)
Tried to contact pt no answer and couldn't LVM. Will try again later. Katharina Caper, April D, [place]

## 2018-03-28 NOTE — Telephone Encounter (Signed)
-----   Message from Sela Hilding, MD sent at 03/28/2018 10:18 AM EDT ----- Called patient to let him know that his kidney function and blood count numbers look normal.  However, his cholesterol continues to be high and his risk of having a heart attack at this number is pretty significant.  He needs to restart his atorvastatin.  If he calls back, he also needs to recheck a BMP in 1 week.  Have placed future order.  Patient mailbox was full.

## 2018-03-28 NOTE — Assessment & Plan Note (Addendum)
Worsening control, patient has not been taking metformin due to GI upset.  Will start Jardiance at low dose, and titrate as able.  Also message to 10 pharmacist to help with med affordability versus co-pay cards.  Additionally, he needs a pillbox as he is unsure about his medicines and does not use one.  Continue Lantus at current dose, given current control hypoglycemia with addition of Jardiance is less likely.  Return in 3 months

## 2018-03-28 NOTE — Assessment & Plan Note (Addendum)
Recheck lipids, likely will need restart of atorvastatin.  ASCVD risk based on last lipid panel is 52%.

## 2018-03-28 NOTE — Assessment & Plan Note (Signed)
Restart ACEi given DM. Last Cr was normal. Will restart and plan for repeat BMP in 10 days.

## 2018-03-29 NOTE — Telephone Encounter (Signed)
Informed pt of below and did not see where the Rx had been sent for his atorvastatin. Routing to PCP so she can send this.  Pt was concerned with pricing and I told him that once he received confirmation that the Rx had been filled he could call them to see how much it would cost.  Also pt is having teeth removed next week will call after that to make a lab appointment.  Katharina Caper, Kindell Strada D, [place]

## 2018-03-30 MED ORDER — ATORVASTATIN CALCIUM 40 MG PO TABS: 40.0000 mg | ORAL_TABLET | Freq: Every day | ORAL | 3 refills | Status: DC

## 2018-03-30 NOTE — Addendum Note (Signed)
Addended by: Glenis Smoker on: 03/30/2018 01:53 PM   Modules accepted: Orders

## 2018-04-03 ENCOUNTER — Other Ambulatory Visit: Payer: Self-pay | Admitting: Pharmacist

## 2018-04-03 NOTE — Patient Outreach (Signed)
Charles Fox) Care Management  04/03/2018  Charles Fox 1951-06-28 [id number]  67 year old male with PMHx significant for diabetes, HTN, HLD, tobacco abuse, morbid obesity, and medication non-compliance followed by [facility] health coach for diabetes disease state management and seen by [facility] in the past for medication adherence.  [facility] referrals placed by PCP, Dr. Lindell Noe, for medication assistance and medication adherence.    Per chart review, patient recently restarted on ACEi and new medication, Jardiance.    Subjective:  Successful call to Charles Fox today.  HIPAA identifiers verified. Charles Fox is agreeable to review medications.  He reports that is taking insulin and aspirin only at this time.  He has not picked up Lipitor or Jardiance and has discontinued metformin.  Patient has not been checking blood sugars.  He reports he doesn't have any money for medications or diabetic testing supplies until he is paid again on Aug 2nd.  He reports having trouble with his teeth and "can't hardly eat. "  He reports that he has trouble affording his rent and wants to move soon.  He reports he smokes a little less than half a pack of cigarettes a day.   Objective:   Labs 03/26/2018 SCr = 0.87  TC = 201 HDL = 45 LDL = 132 TGs = 121 A1C = 10.7  Medications Reviewed Today    Reviewed by Rudean Haskell, RPH (Pharmacist) on 04/03/18 at [place] List Status: <None>  Medication Order Taking? Sig Documenting Provider Last Dose Status Informant  Alcohol Swabs PADS [id number] No He is to check blood glucose 2-3 times a day  Patient not taking:  Reported on 04/03/2018   Mercy Riding, MD Not Taking Active   aspirin 81 MG tablet [id number] Yes Take 1 tablet (81 mg total) by mouth daily. Patrecia Pour, MD Taking Active   atorvastatin (LIPITOR) 40 MG tablet [id number] No Take 1 tablet (40 mg total) by mouth daily.  Patient not taking:  Reported on 04/03/2018   Sela Hilding,  MD Not Taking Active   Blood Glucose Calibration (TRUE METRIX LEVEL 2) Normal SOLN [id number] No Use to check your blood glucose twice a day  Patient not taking:  Reported on 04/03/2018   Mercy Riding, MD Not Taking Active   Blood Glucose Monitoring Suppl (TRUE METRIX METER) w/Device KIT [id number] No Use to check your blood glucose twice a day  Patient not taking:  Reported on 04/03/2018   Mercy Riding, MD Not Taking Active   empagliflozin (JARDIANCE) 10 MG TABS tablet [id number] No Take 10 mg by mouth daily.  Patient not taking:  Reported on 04/03/2018   Sela Hilding, MD Not Taking Active   glucose blood (TRUE METRIX BLOOD GLUCOSE TEST) test strip [id number] No Use to check your blood glucose twice a day  Patient not taking:  Reported on 04/03/2018   Mercy Riding, MD Not Taking Active   Insulin Pen Needle (UNIFINE PENTIPS) 31G X 5 MM MISC [id number] No Use one to inject your insulin once daily  Patient not taking:  Reported on 04/03/2018   Mercy Riding, MD Not Taking Active   Insulin Syringes, Disposable, U-100 0.3 ML MISC [id number] No As directed  Patient not taking:  Reported on 04/03/2018   Lutricia Feil, PA Not Taking Active   Lancets [facility] ULTRASOFT) lancets [id number] No Use to check blood glucose 2-3 times a day  Patient not taking:  Reported on  04/03/2018   Mercy Riding, MD Not Taking Active   LANTUS SOLOSTAR 100 UNIT/ML Solostar Pen [id number] Yes Inject 40 Units into the skin daily at 10 pm. Dickie La, MD Taking Active   lisinopril (PRINIVIL,ZESTRIL) 20 MG tablet [id number] No Take 1 tablet (20 mg total) by mouth daily.  Patient not taking:  Reported on 04/03/2018   Zenia Resides, MD Not Taking Active   metFORMIN (GLUMETZA) 1000 MG (MOD) 24 hr tablet [id number] No Take 1 tablet (1,000 mg total) by mouth daily with breakfast.  Patient not taking:  Reported on 04/03/2018   Mercy Riding, MD Not Taking Active           Assessment:   Drugs sorted by  system:  Cardiovascular: Aspirin  Endocrine: Lantus  Medication Management: Patient with very poor medication compliance and understanding of indications of medications.  We reviewed importance of taking medications and reasons he is prescribed them including testing blood sugar for diabetes management.  Although patient has had many counseling sessions on medications per chart review, he has not retained any knowledge on why he is taking them.  He may benefit from handouts on medications and a list with indications and dosing schedule.   -I will prepare a medication list handout for patient to have at home.   -Once patient has all medications, I will provide a pillbox for patient and assist with filling all medications in pillbox.   Medication Assistance:  Patient currently cannot afford co-pays for any of his medications.    3-way call placed to patient's pharmacy, [facility] outpatient [facility].  Per staff, patient has Lipitor ($4) and Jardiance ($45) ready for pick up.  Patient has active prescription for Lantus ($45).  No active prescription for ACEi or diabetic testing supplies on file.  Patient reports he has no money to pay for prescriptions, including the $4 co-pay for Lipitor.  We discussed budgeting strategies.  We reviewed cost-savings from reducing cigarettes and using this money for improving health with medications rather than harming health with tobacco.  Patient voiced understanding.    Extra Help: Patient reports he is eligible based on current income.  I assisted patient with applying for Extra Help online.  I reviewed with patient that he will receive a mailed letter from [facility] in 2-4 weeks with decision.  Patient is aware to watch for letter in the mail.    PAPs:  Patient eligible to apply for Jardiance PAP.  Patient will drive to [facility] office on Friday to complete paperwork.  Address and directions to [facility] reviewed with patient.    Social services:  Patient with difficulty paying for  housing and medical appointments.  He is agreeable to have [facility] SW reach out to him with additional information and resources and to see if he is eligible for [place] Medicaid.   Plan: I will meet with patient on Friday, July 19th, at [facility] at 10:30 AM for Jardiance PAP.  I will refer [facility] SW.  I will follow-up with patient in 2-4 weeks after this regarding LIS decision and Jardiance PAP.   I will reach out to PCP regarding ACEi prescription.     I will continue to encourage patient to check his blood sugars for diabetes management.   Ralene Bathe, PharmD, [place] [phone]

## 2018-04-04 ENCOUNTER — Other Ambulatory Visit: Payer: Self-pay | Admitting: *Deleted

## 2018-04-04 NOTE — Patient Outreach (Signed)
[facility]) Care Management  04/04/2018  JARRETTE DEHNER 1950/09/30 [id number]   [place] attempted# 1 follow up outreach call to patient.  Patient was unavailable.Per voicemail boxmessage the mailbox is full.  Plan: RN will call patient again within 10 business days. RN will verify insurance since showing inactive  [place] Management [phone]

## 2018-04-04 NOTE — Patient Outreach (Signed)
[facility]) Care Management  04/04/2018  Charles Fox November 16, 1950 [id number]   Patient referred to this social worker by [facility] Pharmacist to explore Medicaid eligibility and affordable housing. Patient's Medicaid eligibility discussed. Based on patient's income, he does not meet the income limits. Independent living for Seniors also explored in [place]. Per patient, since his wife passed, he is unable to afford his current rent on his own.  Plan: Patient has a scheduled office visit with the pharmacist on Friday. This Education officer, museum will meet with patient following his appointment with the Pharmacist top provide affordable housing resources.   Sheralyn Boatman [facility] Care Management [phone]

## 2018-04-05 ENCOUNTER — Telehealth (HOSPITAL_BASED_OUTPATIENT_CLINIC_OR_DEPARTMENT_OTHER): Payer: Self-pay | Admitting: Family Medicine

## 2018-04-05 DIAGNOSIS — I10 Essential (primary) hypertension: Secondary | ICD-10-CM

## 2018-04-05 DIAGNOSIS — IMO0001 Reserved for inherently not codable concepts without codable children: Secondary | ICD-10-CM

## 2018-04-05 DIAGNOSIS — E119 Type 2 diabetes mellitus without complications: Secondary | ICD-10-CM

## 2018-04-05 DIAGNOSIS — Z794 Long term (current) use of insulin: Principal | ICD-10-CM

## 2018-04-05 MED ORDER — LISINOPRIL 20 MG PO TABS: 20.0000 mg | ORAL_TABLET | Freq: Every day | ORAL | 3 refills | Status: DC

## 2018-04-05 NOTE — Telephone Encounter (Signed)
Received [facility] pharmacy message suggesting that the patient did not pick up his ACE inhibitor, will re-prescribe this.  He is being helped with low income subsidy at this time.

## 2018-04-05 NOTE — Telephone Encounter (Deleted)
-----   Message from Rudean Haskell, [facility] sent at 04/03/2018 12:59 PM EDT ----- Hi Dr. Lindell Noe, I spoke with Mr. Hun today regarding his medications.  I helped him apply for LIS and will meet with him on Friday to apply for Jardiance patient assistance program.   I saw you had restarted an ACEi at last office visit but [facility] outpatient pharmacy doesn't have any current ACEi on file.  Can you please resend RX to them when you have time?  Patient states he can't afford any medication right now, including the $4 Lipitor but at least the ACEi will be on file for when he can afford it.   Thanks! [employer]

## 2018-04-06 ENCOUNTER — Other Ambulatory Visit: Payer: Self-pay | Admitting: Pharmacist

## 2018-04-06 ENCOUNTER — Other Ambulatory Visit: Payer: Self-pay | Admitting: *Deleted

## 2018-04-06 NOTE — Patient Outreach (Signed)
[facility]) Care Management  04/06/2018  ZAIR BORAWSKI 1951-04-01 [id number]    Patient referred to this social worker by [facility] Pharmacist to explore Medicaid eligibility and affordable housing. Patient's Medicaid eligibility discussed. Based on patient's income, he does not meet the income limits. Independent living for Seniors also explored in [place]. A list of affordable housing in [facility] provided to patient and reviewed following a in office visit with the [facility] Pharmacist. This social worker explained that the housing options provided is income based and that he would have to contact each housing option individual to discuss application process and availability. Patient verbalized an understanding of this and reports ability to make phone calls. Patient also drives and will be able to transport himself to the different apartments for tours. Patient verbalized an appreciation for community resources provided. Patient verbalized no additional community resource needs.  Patient provided with this social worker's contact information if there re any issues or concerns that arise in the future.   Sheralyn Boatman [facility] Care Management [phone]

## 2018-04-06 NOTE — Patient Outreach (Addendum)
[facility]) Care Management  04/06/2018  Charles Fox Nov 24, 1950 [id number]   Scheduled office visit with patient today at 10:30 AM.    Patient did not arrive to appointment.  2 phone call attempts were made to patient however patient did not answer.  I left HIPAA compliant voicemails requesting a return call to reschedule appointment. I notified [facility] LCSW of patient's "No Show."    Plan: I will reach out to patient next week regarding medication assistance.   Ralene Bathe, PharmD, [place] [phone]    Addendum: Patient arrived at [facility] office at 12:45 PM to complete patient assistance paperwork for Jardiance.  I assisted patient with forms.    Per review of https://hill.biz/, patient has the following medicare coverage:  -Current Coverage: [place] 2 (HMO) [phone]) -Current Subsidy: Partial Extra Help: 50%  Patient states he stopped by his pharmacy to review what medications were ready and was told he had 3 medications ready for $50.  He does not get paid until Aug 1 and 3rd and therefore cannot pick up medications until then. I encourage patient to reduce smoking to have more money available for medications and to improve health.  Patient states he is "smoking less" and is trying to work on this.    Plan: I will coordinate with [facility] pharmacy technician to obtain provider portion of PAP and submit once completed.    Ralene Bathe, PharmD, [place] [phone]

## 2018-04-10 ENCOUNTER — Ambulatory Visit: Payer: Self-pay | Admitting: Pharmacist

## 2018-04-10 ENCOUNTER — Other Ambulatory Visit: Payer: Self-pay | Admitting: Pharmacy Technician

## 2018-04-10 NOTE — Patient Outreach (Signed)
[facility]) Care Management  04/11/2018  LONN IM 1951-06-21 [id number]   Received [employer] patient assistance referral from [place] for Charles Fox. Colleen had patient complete application at home visit. I faxed provider portion to Dr.Timberlake.  Will submit completed application to B-I once provider portion os received.  Maud Deed [place], [place] Management [phone]

## 2018-04-13 ENCOUNTER — Other Ambulatory Visit: Payer: Self-pay | Admitting: *Deleted

## 2018-04-13 NOTE — Patient Outreach (Signed)
[facility]) Care Management  04/13/2018  ANTHONNY SCHILLER 1950/12/26 [id number]   Referral received from [place] to contact patient to assist him to complete apartment applications. Phone call to patient to schedule a time to complete this. Patient did not answer, voicemail message left for a return call.   Sheralyn Boatman [facility] Care Management [phone]

## 2018-04-17 ENCOUNTER — Other Ambulatory Visit: Payer: Self-pay | Admitting: Pharmacy Technician

## 2018-04-17 NOTE — Patient Outreach (Signed)
[facility]) Care Management  04/17/2018  Charles Fox 1950/11/21 [id number]   Received provider portion of application for Jardiance. Faxed completed application and required documents.  Will follow up B-I in 14-21 business days to check status of application.  Maud Deed [place], [place] Management [phone]

## 2018-04-18 ENCOUNTER — Other Ambulatory Visit: Payer: Medicare Other | Admitting: *Deleted

## 2018-04-18 ENCOUNTER — Other Ambulatory Visit: Payer: Self-pay | Admitting: *Deleted

## 2018-04-18 NOTE — Patient Outreach (Signed)
[facility]) Care Management  04/18/2018  WEBER MONNIER Aug 03, 1951 [id number]   Phone call to patient to discuss assistance needs with apartment applications. Per patient, he was able to complete the applications on his own and will submit it today. Per patient, moving to senior housing will save him about $400.00 per month. Patient appreciative of the assistance provided and verbalized no additional community resource needs at this time.   Sheralyn Boatman [facility] Care Management [phone]

## 2018-04-20 ENCOUNTER — Other Ambulatory Visit: Payer: Self-pay

## 2018-04-20 ENCOUNTER — Telehealth: Payer: Self-pay

## 2018-04-20 ENCOUNTER — Ambulatory Visit (INDEPENDENT_AMBULATORY_CARE_PROVIDER_SITE_OTHER): Payer: Medicare Other | Admitting: Family Medicine

## 2018-04-20 ENCOUNTER — Other Ambulatory Visit: Payer: Self-pay | Admitting: *Deleted

## 2018-04-20 VITALS — BP 142/74 | HR 91 | Temp 98.0°F | Wt 252.6 lb

## 2018-04-20 DIAGNOSIS — E119 Type 2 diabetes mellitus without complications: Secondary | ICD-10-CM

## 2018-04-20 DIAGNOSIS — IMO0001 Reserved for inherently not codable concepts without codable children: Secondary | ICD-10-CM

## 2018-04-20 DIAGNOSIS — Z794 Long term (current) use of insulin: Secondary | ICD-10-CM | POA: Diagnosis not present

## 2018-04-20 DIAGNOSIS — I10 Essential (primary) hypertension: Secondary | ICD-10-CM

## 2018-04-20 LAB — GLUCOSE, POCT (MANUAL RESULT ENTRY): POC GLUCOSE: 161 mg/dL — AB (ref 70–99)

## 2018-04-20 MED ORDER — LANTUS SOLOSTAR 100 UNIT/ML ~~LOC~~ SOPN: 40.0000 [IU] | PEN_INJECTOR | Freq: Every day | SUBCUTANEOUS | 0 refills | Status: DC

## 2018-04-20 MED ORDER — LISINOPRIL 20 MG PO TABS: 20.0000 mg | ORAL_TABLET | Freq: Every day | ORAL | 3 refills | Status: DC

## 2018-04-20 MED ORDER — EMPAGLIFLOZIN 10 MG PO TABS: 10.0000 mg | ORAL_TABLET | Freq: Every day | ORAL | 2 refills | Status: DC

## 2018-04-20 MED ORDER — ATORVASTATIN CALCIUM 40 MG PO TABS: 40.0000 mg | ORAL_TABLET | Freq: Every day | ORAL | 3 refills | Status: DC

## 2018-04-20 MED ORDER — METFORMIN HCL ER 500 MG PO TB24: 1000.0000 mg | ORAL_TABLET | Freq: Every day | ORAL | 2 refills | Status: DC

## 2018-04-20 MED ORDER — BLOOD GLUCOSE MONITOR KIT: PACK | 0 refills | Status: DC

## 2018-04-20 MED FILL — LISINOPRIL 20 MG TABLET: 20 | 90 days supply | Qty: 90 | Fill #0

## 2018-04-20 NOTE — Progress Notes (Signed)
    Subjective:  Charles Fox is a 67 y.o. male who presents to the [facility] today with a chief complaint of DM management/refill.   HPI: Patient with multiple issues lately affording medicine, seems unreliable and inconsistent in story about glucometer possession, doses of insulin, and prior medical advice.  There seems to be a question as to if he can afford lantus long term (is in donut hole apparently).  He states he never had a glucometer which doesn't agree with prior charting.  He says he always takes 40u although there is charting with him saying otherwise.  His story changes a few times during the encounter as to timeline of him arranging assistance.  He agrees to [place] checking his CBG today   Objective:  Physical Exam: BP (!) 142/74   Pulse 91   Temp 98 F (36.7 C) (Oral)   Wt 252 lb 9.6 oz (114.6 kg)   SpO2 96%   BMI 38.41 kg/m   Gen: NAD, resting comfortably, obese,  CV: RRR with no murmurs appreciated Pulm: NWOB, CTAB with no crackles, wheezes, or rhonchi GI: Normal bowel sounds present. Soft, Nontender, Nondistended. MSK: no edema, cyanosis, or clubbing noted Skin: warm, dry Neuro: grossly normal, moves all extremities Psych: Normal affect but occasionally confused thought process  Results for orders placed or performed in visit on 04/20/18 (from the past 72 hour(s))  Glucose (CBG)     Status: Abnormal   Collection Time: 04/20/18  8:57 AM  Result Value Ref Range   POC Glucose 161 (A) 70 - 99 mg/dl     Assessment/Plan:  Diabetes mellitus Patient with multiple issues lately affording medicine, seems unreliable and inconsistent in story about glucometer possession, doses of insulin, and prior medical advice.  I question whether he can afford lantus long term (is in donut hole apparently) and whether he is able to accurately assess hypo/hyperglycemia as he says he has no glucometer.  Discussed w/ Dr. Dixon Boos.  Ordered glucometer, patient to continue with 30u from  lantus samples until meeting with Dr. Timberlake/pharmacist to develop long term plan (he suggests consideration on NPH but wants to leave that decision to her as PCP)   Sherene Sires, [place] - PGY2 04/20/2018 9:35 AM

## 2018-04-20 NOTE — Telephone Encounter (Signed)
Received call on nurse line from pts pharmacy regarding his metformin. Pharmacist states the glumetza called in by Gonfa (metformin 1000mg ) is over 10k. Pharmacist asking for metformin to be resent in for 500mg  tablets, pt take 2 with breakfast daily. Please advise.

## 2018-04-20 NOTE — Patient Instructions (Addendum)
It was a pleasure to see you today! Thank you for choosing [facility] for your primary care. Charles Fox was seen for medication management/refills. Come back to the clinic for your appts with pharmacy and Dr. Lindell Noe, and go to the emergency room if you have any life threatening symptoms.  Today your CBG was 161 and you took 40u of insulin yesterday.  We discussed concerns about you not checking your sugars at home.  Continue using the 30u daily of the lantus we gave you until you see the pharmacist and Dr. Lindell Noe to come up with your long term plan.  If you don't hear from [place] in two weeks, please give [place] a call to verify your results. Otherwise, we look forward to seeing you again at your next visit. If you have any questions or concerns before then, please call the clinic at [phone].   Please bring all your medications to every doctors visit   Sign up for My Chart to have easy access to your labs results, and communication with your Primary care physician.     Please check-out at the front desk before leaving the clinic.     Best,  Dr. Sherene Sires FAMILY MEDICINE RESIDENT - PGY2 04/20/2018 9:36 AM

## 2018-04-20 NOTE — Assessment & Plan Note (Addendum)
Patient with multiple issues lately affording medicine, seems unreliable and inconsistent in story about glucometer possession, doses of insulin, and prior medical advice.  I question whether he can afford lantus long term (is in donut hole apparently) and whether he is able to accurately assess hypo/hyperglycemia as he says he has no glucometer.  Discussed w/ Dr. Dixon Boos.  Ordered glucometer, patient to continue with 30u from lantus samples until meeting with Dr. Timberlake/pharmacist to develop long term plan (he suggests consideration on NPH but wants to leave that decision to her as PCP)

## 2018-04-20 NOTE — Telephone Encounter (Signed)
Refilled meds and adjusted metformin to 500mg  XR tabs, to be dosed 1000mg  QD with breakfast for cost per pharm recommendations.

## 2018-04-23 NOTE — Telephone Encounter (Signed)
Pharmacy called to check status.  Ok given by Dr. Nori Riis to change meds. [place], Salome Spotted, CMA

## 2018-04-23 NOTE — Telephone Encounter (Signed)
I changed his metformin in the refill request on 8/2 per pharmacy request. Did they not receive this? Thanks to Dr. Nori Riis for the help

## 2018-04-30 ENCOUNTER — Other Ambulatory Visit: Payer: Self-pay | Admitting: Pharmacist

## 2018-04-30 NOTE — Patient Outreach (Signed)
[facility]) Care Management  04/30/2018  KRAVEN CALK Jul 25, 1951 [id number]   Unsuccessful call attempt to Mr. [name] to follow-up on medication adherence I left a HIPAA compliant voicemail requesting a return call. [facility] pharmacy technician has submitted application for Jardiance and will follow-up on decision from PAP company.    Plan: I will follow-up with patient later this week unless I hear back from him beforehand.   Ralene Bathe, PharmD, [place] [phone]

## 2018-05-02 ENCOUNTER — Encounter (HOSPITAL_COMMUNITY): Payer: Self-pay | Admitting: Emergency Medicine

## 2018-05-02 ENCOUNTER — Emergency Department (HOSPITAL_COMMUNITY)
Admission: EM | Admit: 2018-05-02 | Discharge: 2018-05-02 | Disposition: A | Discharge status: Home / Self Care | Payer: Medicare Other | Attending: Emergency Medicine | Admitting: Emergency Medicine

## 2018-05-02 DIAGNOSIS — L02811 Cutaneous abscess of head [any part, except face]: Secondary | ICD-10-CM | POA: Insufficient documentation

## 2018-05-02 DIAGNOSIS — F17210 Nicotine dependence, cigarettes, uncomplicated: Secondary | ICD-10-CM | POA: Diagnosis not present

## 2018-05-02 DIAGNOSIS — E119 Type 2 diabetes mellitus without complications: Secondary | ICD-10-CM | POA: Insufficient documentation

## 2018-05-02 DIAGNOSIS — I10 Essential (primary) hypertension: Secondary | ICD-10-CM | POA: Insufficient documentation

## 2018-05-02 DIAGNOSIS — L539 Erythematous condition, unspecified: Secondary | ICD-10-CM | POA: Diagnosis present

## 2018-05-02 DIAGNOSIS — Z794 Long term (current) use of insulin: Secondary | ICD-10-CM | POA: Insufficient documentation

## 2018-05-02 DIAGNOSIS — Z79899 Other long term (current) drug therapy: Secondary | ICD-10-CM | POA: Insufficient documentation

## 2018-05-02 MED ORDER — SULFAMETHOXAZOLE-TRIMETHOPRIM 800-160 MG PO TABS: 1.0000 | ORAL_TABLET | Freq: Two times a day (BID) | ORAL | 0 refills | Status: AC

## 2018-05-02 NOTE — ED Triage Notes (Signed)
Pt has 2 bumps on posterior head that come up over week ago. Denies any drainage or pain.

## 2018-05-02 NOTE — ED Notes (Signed)
Bed: WTR8 Expected date:  Expected time:  Means of arrival:  Comments: 

## 2018-05-02 NOTE — ED Provider Notes (Signed)
[facility] DEPT Provider Note   CSN: [id number] Arrival date & time: 05/02/18  1220     History   Chief Complaint Chief Complaint  Patient presents with  . bumps on head    HPI Charles Fox is a 66 y.o. male.  67 year old male presents with complaint of 2 bumps to the back of his scalp for the past 2 weeks.  Patient is tried squeezing the areas however areas persist and are tender to the touch, he has difficulty visualizing the areas do to the location on the posterior scalp.  No other complaints or concerns.     Past Medical History:  Diagnosis Date  . Diabetes mellitus without complication ([place])   . Hypertension     Patient Active Problem List   Diagnosis Date Noted  . Routine adult health maintenance 03/27/2017  . Hypercholesterolemia 12/22/2016  . Urine test positive for microalbuminuria 05/27/2016  . Diabetes mellitus ([place]) 09/04/2014  . Morbid obesity ([place]) 09/04/2014  . HTN 09/04/2014  . Tobacco use disorder 09/04/2014    Past Surgical History:  Procedure Laterality Date  . BACK SURGERY    . FINGER SURGERY          Home Medications    Prior to Admission medications   Medication Sig Start Date End Date Taking? Authorizing Provider  Alcohol Swabs PADS He is to check blood glucose 2-3 times a day Patient not taking: Reported on 04/03/2018 12/04/17   Mercy Riding, MD  aspirin 81 MG tablet Take 1 tablet (81 mg total) by mouth daily. 11/21/14   Patrecia Pour, MD  atorvastatin (LIPITOR) 40 MG tablet Take 1 tablet (40 mg total) by mouth daily. 04/20/18   Sela Hilding, MD  Blood Glucose Calibration (TRUE METRIX LEVEL 2) Normal SOLN Use to check your blood glucose twice a day Patient not taking: Reported on 04/03/2018 12/20/17   Mercy Riding, MD  blood glucose meter kit and supplies KIT Dispense based on patient and insurance preference. Use up to four times daily as directed. (FOR ICD-9 250.00, 250.01). 04/20/18   Sherene Sires,  DO  Blood Glucose Monitoring Suppl (TRUE METRIX METER) w/Device KIT Use to check your blood glucose twice a day Patient not taking: Reported on 04/03/2018 12/20/17   Mercy Riding, MD  empagliflozin (JARDIANCE) 10 MG TABS tablet Take 10 mg by mouth daily. 04/20/18   Sela Hilding, MD  glucose blood (TRUE METRIX BLOOD GLUCOSE TEST) test strip Use to check your blood glucose twice a day Patient not taking: Reported on 04/03/2018 12/20/17   Mercy Riding, MD  Insulin Pen Needle (UNIFINE PENTIPS) 31G X 5 MM MISC Use one to inject your insulin once daily Patient not taking: Reported on 04/03/2018 09/29/17   Mercy Riding, MD  Insulin Syringes, Disposable, U-100 0.3 ML MISC As directed Patient not taking: Reported on 04/03/2018 12/23/14   Presson, Audelia Hives, PA  Lancets [facility] ULTRASOFT) lancets Use to check blood glucose 2-3 times a day Patient not taking: Reported on 04/03/2018 12/04/17   Mercy Riding, MD  LANTUS SOLOSTAR 100 UNIT/ML Solostar Pen Inject 40 Units into the skin daily at 10 pm. 04/20/18   Sela Hilding, MD  lisinopril (PRINIVIL,ZESTRIL) 20 MG tablet Take 1 tablet (20 mg total) by mouth daily. 04/20/18   Sela Hilding, MD  metFORMIN (GLUCOPHAGE-XR) 500 MG 24 hr tablet Take 2 tablets (1,000 mg total) by mouth daily with breakfast. 04/20/18   Sela Hilding, MD  sulfamethoxazole-trimethoprim (BACTRIM DS,SEPTRA DS) 800-160 MG tablet Take 1 tablet by mouth 2 (two) times daily for 10 days. 05/02/18 05/12/18  Tacy Learn, PA-C    Family History No family history on file.  Social History Social History   Tobacco Use  . Smoking status: Current Every Day Smoker    Packs/day: 0.50    Types: Cigarettes  . Smokeless tobacco: Never Used  Substance Use Topics  . Alcohol use: No  . Drug use: Not on file     Allergies   Patient has no known allergies.   Review of Systems Review of Systems  Constitutional: Negative for fever.  Gastrointestinal: Negative for nausea and  vomiting.  Musculoskeletal: Negative for neck pain.  Skin: Positive for wound.  Allergic/Immunologic: Positive for immunocompromised state.  Neurological: Negative for headaches.  Hematological: Negative for adenopathy.  Psychiatric/Behavioral: Negative for confusion.  All other systems reviewed and are negative.    Physical Exam Updated Vital Signs BP (!) 158/100   Pulse (!) 111   Temp 97.8 F (36.6 C) (Oral)   Resp 15   SpO2 100%   Physical Exam  Constitutional: He is oriented to person, place, and time. He appears well-developed and well-nourished. No distress.  HENT:  Head: Normocephalic and atraumatic.    Neck: Neck supple.  Pulmonary/Chest: Effort normal.  Lymphadenopathy:    He has no cervical adenopathy.  Neurological: He is alert and oriented to person, place, and time.  Skin: Skin is warm and dry. He is not diaphoretic.  Psychiatric: He has a normal mood and affect. His behavior is normal.  Nursing note and vitals reviewed.    ED Treatments / Results  Labs (all labs ordered are listed, but only abnormal results are displayed) Labs Reviewed - No data to display  EKG None  Radiology No results found.  Procedures Procedures (including critical care time)  Medications Ordered in ED Medications - No data to display   Initial Impression / Assessment and Plan / ED Course  I have reviewed the triage vital signs and the nursing notes.  Pertinent labs & imaging results that were available during my care of the patient were reviewed by me and considered in my medical decision making (see chart for details).     Final Clinical Impressions(s) / ED Diagnoses   Final diagnoses:  Abscess, scalp    ED Discharge Orders         Ordered    sulfamethoxazole-trimethoprim (BACTRIM DS,SEPTRA DS) 800-160 MG tablet  2 times daily     05/02/18 1327           Roque Lias 05/02/18 1331    Blanchie Dessert, MD 05/03/18 [phone]

## 2018-05-02 NOTE — Discharge Instructions (Addendum)
Take antibiotics and complete the whole course. Apply warm compresses for 20 minutes at a time. Apply bacitracin ointment to area as directed. Recheck with your PCP.

## 2018-05-04 ENCOUNTER — Other Ambulatory Visit: Payer: Self-pay | Admitting: Pharmacist

## 2018-05-04 NOTE — Patient Outreach (Signed)
[facility]) Care Management  05/04/2018  Charles Fox [birth year] [id number]  Successful call to Charles Fox today to review medication adherence.  HIPAA identifiers verified.   Per review of [email or web address].biz/, patient has the following medicare coverage:  -Current Coverage: [place] 2 (HMO) [phone]) -Current Subsidy: Partial Extra Help: 50%  Medication Adherence:   Similar to previous month, patient reports he has not had a chance to pick up atorvastatin from pharmacy and does not have the money to pay for it.   Patient reports he is taking Aspirin 81mg , metformin, lisinopril, and Lantus 30 units qPM per review of medication bottles telephonically.  He reports he has lowered his insulin dose to try to make it last longer.    I reviewed mail order options with patient based on his Medicare prescription coverage.  Lisinopril, metformin, and atorvastatin may be $0 copay via mail order.  Patient is agreeable to have prescriptions processed via mail order. He reports he will not likely need lisinopril or metformin filled for a few weeks as these were recently filled locally.    Patient reports he is having trouble using his glucometer.  He replaced batteries but is not sure how to insert test strip.    I instructed patient on correct test strip insertion.  He has office visit next Monday morning with PCP. I instructed patient to bring glucometer to office to review with staff.  If patient continues to have difficulty using it, I can further assist.      Patient has poor understanding of medication indication.    I reviewed all medications with patient but he will benefit from further education.   Plan: I will request Tier 1 medications be sent to mail order pharmacy to take advantage of $0 co-pay for patient.    I will follow-up with patient next week following office visit regarding glucometer.    Ralene Bathe, PharmD, [place] [phone]

## 2018-05-07 ENCOUNTER — Other Ambulatory Visit: Payer: Self-pay | Admitting: Pharmacist

## 2018-05-07 ENCOUNTER — Other Ambulatory Visit: Payer: Self-pay

## 2018-05-07 ENCOUNTER — Encounter: Payer: Self-pay | Admitting: Family Medicine

## 2018-05-07 ENCOUNTER — Ambulatory Visit (INDEPENDENT_AMBULATORY_CARE_PROVIDER_SITE_OTHER): Payer: Medicare Other | Admitting: Family Medicine

## 2018-05-07 DIAGNOSIS — L989 Disorder of the skin and subcutaneous tissue, unspecified: Secondary | ICD-10-CM | POA: Diagnosis not present

## 2018-05-07 DIAGNOSIS — I10 Essential (primary) hypertension: Secondary | ICD-10-CM

## 2018-05-07 DIAGNOSIS — E7800 Pure hypercholesterolemia, unspecified: Secondary | ICD-10-CM | POA: Diagnosis not present

## 2018-05-07 DIAGNOSIS — E119 Type 2 diabetes mellitus without complications: Secondary | ICD-10-CM

## 2018-05-07 DIAGNOSIS — IMO0001 Reserved for inherently not codable concepts without codable children: Secondary | ICD-10-CM

## 2018-05-07 DIAGNOSIS — Z794 Long term (current) use of insulin: Secondary | ICD-10-CM

## 2018-05-07 MED ORDER — INSULIN DEGLUDEC 200 UNIT/ML ~~LOC~~ SOPN: 30.0000 [IU] | PEN_INJECTOR | Freq: Every day | SUBCUTANEOUS | 0 refills | Status: DC

## 2018-05-07 NOTE — Patient Outreach (Signed)
[facility]) Care Management  05/07/2018  Charles Fox [birth year] [id number]   Successful call today with [name] following his PCP appointment this morning.   Patient reports he was shown how to use his glucometer correctly during visit and that he is now able to use it at home.  He denies have any other questions about the meter.   Patient reports he spoke with Dr. Lindell Fox regarding mail order and let her know that he is agreeable to try it.  We reviewed that he will likely be contacted by mail order pharmacy to confirm address and medications to be shipped once prescriptions are received.  Patient aware he needs to pick up an antibiotic at the outpatient pharmacy for a head lesion. Per notes, patient given insulin sample during visit.    Plan: I will follow-up with patient later this week to ensure lisinopril, metformin, and atorvastatin prescriptions received by [facility].    Ralene Bathe, PharmD, [place] [phone]

## 2018-05-07 NOTE — Progress Notes (Signed)
CC: DM, head lesions, HTN  HPI  CMA reviewed glucometer today. He feels more confident now. Was having trouble putting the strips in.   DM - almost out of Lantus. Uses lantus at night. Is eating less, rare soft drinks. Last night used 30. Using 30 every day. Metformin he is taking 500mg  BID, feels funny when he takes the whole 1000mg . Asa in am. Not taking jardiance, but taking "the little red pill that has something to do with diabetes." less appetite. Can't chew because he doesn't have teeth. Quit eating bacon and sausage, no more bread. Had a boiled egg this am. States family can't help with meds, he is going to stop living with them soon. Wasn't sure why mail order pharmacy was contacting him.   htn - doesn't have any lisinopril at home.   hld- taking atorvastatin in the morning.   Head lesion - never picked up antibiotic rx because he doesn't have any money, states he will get his check 9/1. Tender, no fever. He feels this is related to being out in the heat. Never seen derm before. The similar spots on his leg itch and he does pick at them.   ROS: Denies CP, SOB, abdominal pain, dysuria, changes in BMs.   CC, SH/smoking status, and VS noted  Objective: BP (!) 148/84   Pulse 79   Temp 98.7 F (37.1 C) (Oral)   Ht 5\' 8"  (1.727 m)   Wt 253 lb 12.8 oz (115.1 kg)   SpO2 98%   BMI 38.59 kg/m  Gen: NAD, alert, cooperative, and pleasant. HEENT: NCAT, EOMI, PERRL. Two lesions over posterior occiput without fluctuance, +induration, mildly TTP.    CV: RRR, no murmur Resp: CTAB, no wheezes, non-labored Abd: SNTND, BS present, no guarding or organomegaly Ext: No edema, warm. Lesions on legs nontender, raised, hyperpigmented, with surrounding scale. Chronic.     Neuro: Alert and oriented, Speech clear, No gross deficits  Assessment and plan:  HTN Refill lisinopril to mail order rx as this will be $0 per [place] help.   Diabetes mellitus Concern for limited  understanding of disease course by patient despite many different care team members educating him. Unsure if this is dementia related or due to general low health literacy. Considered switch to NPH for affordability, but patient states he has $0 today and cannot pay anything for meds. Will give another sample, we only have triseba samples today. Will continue with triseba 30U and patient should check CBG as instructed and call with hypoglycemia. Will also refill jardiance to mail order rx.   Hypercholesterolemia Refill atorvastatin to mail order rx for cost.   Skin lesions, generalized Wide differential for these chronic skin changes with flares of new lesions. Patient well appearing. For acute lesion on posterior scalp, will have patient pick up antibiotic as written by ED and return if persistent or other symptoms. Differential includes prurigo nodularis, Botryomycosis, Pityrosporum folliculitis, or skin findings of Mucormycosis. Consider derm referral for possible biopsy going forward.    No orders of the defined types were placed in this encounter.   Meds ordered this encounter  Medications  . Insulin Degludec (TRESIBA FLEXTOUCH) 200 UNIT/ML SOPN    Sig: Inject 30 Units into the skin daily.    Dispense:  1 pen    Refill:  0    Order Specific Question:   Lot Number?    Answer:   [id number]    Order Specific Question:   Expiration Date?  Answer:   11/17/2019    Order Specific Question:   Quantity    Answer:   1    Comments:   pen  . aspirin 81 MG tablet    Sig: Take 1 tablet (81 mg total) by mouth daily.    Dispense:  90 tablet    Refill:  3  . atorvastatin (LIPITOR) 40 MG tablet    Sig: Take 1 tablet (40 mg total) by mouth daily.    Dispense:  90 tablet    Refill:  3  . empagliflozin (JARDIANCE) 10 MG TABS tablet    Sig: Take 10 mg by mouth daily.    Dispense:  90 tablet    Refill:  2    Please substitute generic if cost is more affordable  . lisinopril (PRINIVIL,ZESTRIL) 20 MG  tablet    Sig: Take 1 tablet (20 mg total) by mouth daily.    Dispense:  90 tablet    Refill:  3  . metFORMIN (GLUCOPHAGE-XR) 500 MG 24 hr tablet    Sig: Take 1 tablet (500 mg total) by mouth daily with breakfast.    Dispense:  90 tablet    Refill:  2   Ralene Ok, MD, PGY3 05/09/2018 9:17 AM

## 2018-05-07 NOTE — Patient Instructions (Signed)
It was a pleasure to see you today! Thank you for choosing [facility] for your primary care. Charles Fox was seen for diabetes, head lesion.   Our plans for today were:  Please go pick up the antibiotic to try to clear up the spot on your head. If it is still there next week, come back and we will send you to the dermatologist.   For your diabetes, use the sample we gave you also at 30 units.   You should return to our clinic to see Dr. Lindell Noe in 1 week for head lesion.   Best,  Dr. Lindell Noe

## 2018-05-08 ENCOUNTER — Other Ambulatory Visit: Payer: Self-pay | Admitting: *Deleted

## 2018-05-08 DIAGNOSIS — Z794 Long term (current) use of insulin: Principal | ICD-10-CM

## 2018-05-08 DIAGNOSIS — I10 Essential (primary) hypertension: Secondary | ICD-10-CM

## 2018-05-08 DIAGNOSIS — E119 Type 2 diabetes mellitus without complications: Secondary | ICD-10-CM

## 2018-05-09 DIAGNOSIS — L989 Disorder of the skin and subcutaneous tissue, unspecified: Secondary | ICD-10-CM

## 2018-05-09 HISTORY — DX: Disorder of the skin and subcutaneous tissue, unspecified: L98.9

## 2018-05-09 MED ORDER — METFORMIN HCL ER 500 MG PO TB24: 500.0000 mg | ORAL_TABLET | Freq: Every day | ORAL | 2 refills | Status: DC

## 2018-05-09 MED ORDER — ATORVASTATIN CALCIUM 40 MG PO TABS: 40.0000 mg | ORAL_TABLET | Freq: Every day | ORAL | 3 refills | Status: DC

## 2018-05-09 MED ORDER — ASPIRIN 81 MG PO TABS: 81.0000 mg | ORAL_TABLET | Freq: Every day | ORAL | 3 refills | Status: DC

## 2018-05-09 MED ORDER — EMPAGLIFLOZIN 10 MG PO TABS: 10.0000 mg | ORAL_TABLET | Freq: Every day | ORAL | 2 refills | Status: DC

## 2018-05-09 MED ORDER — LISINOPRIL 20 MG PO TABS: 20.0000 mg | ORAL_TABLET | Freq: Every day | ORAL | 3 refills | Status: DC

## 2018-05-09 NOTE — Assessment & Plan Note (Signed)
Wide differential for these chronic skin changes with flares of new lesions. Patient well appearing. For acute lesion on posterior scalp, will have patient pick up antibiotic as written by ED and return if persistent or other symptoms. Differential includes prurigo nodularis, Botryomycosis, Pityrosporum folliculitis, or skin findings of Mucormycosis. Consider derm referral for possible biopsy going forward.

## 2018-05-09 NOTE — Assessment & Plan Note (Signed)
Refill lisinopril to mail order rx as this will be $0 per [place] help.

## 2018-05-09 NOTE — Assessment & Plan Note (Signed)
Concern for limited understanding of disease course by patient despite many different care team members educating him. Unsure if this is dementia related or due to general low health literacy. Considered switch to NPH for affordability, but patient states he has $0 today and cannot pay anything for meds. Will give another sample, we only have triseba samples today. Will continue with triseba 30U and patient should check CBG as instructed and call with hypoglycemia. Will also refill jardiance to mail order rx.

## 2018-05-09 NOTE — Assessment & Plan Note (Signed)
Refill atorvastatin to mail order rx for cost.

## 2018-05-11 ENCOUNTER — Other Ambulatory Visit: Payer: Self-pay | Admitting: Pharmacist

## 2018-05-11 NOTE — Patient Outreach (Signed)
[place] [facility]) Care Management  05/11/2018  Charles Fox [birth year] [id number]  67 year old male followed by [place] for medication management.  Patient had Tier 1 medications sent to mail order pharmacy earlier this week.  I am calling to assist making 3 way call to patient's insurance to confirm medications can be mailed to patient.   Unsuccessful call attempt to Mr. Ladora Daniel regarding mail order medications.  Voicemail is full therefore unable to leave message.    Plan: I will call patient again early next week.   Ralene Bathe, PharmD, [place] [phone]

## 2018-05-14 ENCOUNTER — Ambulatory Visit: Payer: Self-pay | Admitting: Pharmacist

## 2018-05-15 ENCOUNTER — Other Ambulatory Visit: Payer: Self-pay | Admitting: Pharmacist

## 2018-05-15 NOTE — Patient Outreach (Signed)
[place] [facility]) Care Management  05/15/2018  Charles Fox 10/05/1950 [id number]  Successful call to Charles Fox today.  Patient reports he received medications from mail order pharmacy today but is not sure the names of the medications.  He is not at home right now to review them.  He reports he is using insulin daily and CBGs have ranged from 118-200 over the last day.    I offered to visit the patient in his home to review all medications to ensure he does not take duplicates from mail order and previous supply but patient declined needing a home visit.  He reports that he can recognize the names of the medications and will not take duplicates. He prefers that I follow-up with him in the next 1-2 weeks regarding medication adherence.   Plan: I will reach out to patient on Friday to verify he has all medications prescribed now at home and is aware of how to take them as instructed.     Ralene Bathe, PharmD, [place] [phone]

## 2018-05-18 ENCOUNTER — Other Ambulatory Visit: Payer: Self-pay | Admitting: Pharmacist

## 2018-05-18 MED FILL — LANTUS SOLOSTAR 100 UNITS/M: 100 | 30 days supply | Qty: 12 | Fill #2

## 2018-05-18 NOTE — Patient Outreach (Signed)
[place] [facility]) Care Management  05/18/2018  KARL ERWAY [birth year] [id number]  Unsuccessful call to Mr. Costlow today to follow-up on medication adherence.  Unable to leave voicemail as inbox is full.    Plan: I will follow-up with patient next week unless I hear back from him beforehand.   Ralene Bathe, PharmD, [place] [phone]

## 2018-05-21 NOTE — Patient Outreach (Signed)
[place] [facility]) Care Management  [id number]  Charles Fox 04/05/1951 [id number]  RN Health Coach telephone call to patient.  [place] compliance verified. Per patient he has not checked his blood sugar. Per patient he can't find his meter. Patient stated he does not have any insulin or money to get any. RN asked patient had he called his Dr office and patient stated no. RN told patient againg that he needs to call Dr office for samples. Patient is still currently smoking . RN will follow up for compliance with further outreach.  Current Medications:  Current Outpatient Medications  Medication Sig Dispense Refill  . Alcohol Swabs PADS He is to check blood glucose 2-3 times a day (Patient not taking: Reported on 04/03/2018) 100 each 11  . aspirin 81 MG tablet Take 1 tablet (81 mg total) by mouth daily. 90 tablet 3  . atorvastatin (LIPITOR) 40 MG tablet Take 1 tablet (40 mg total) by mouth daily. 90 tablet 3  . Blood Glucose Calibration (TRUE METRIX LEVEL 2) Normal SOLN Use to check your blood glucose twice a day (Patient not taking: Reported on 04/03/2018) 1 each 11  . blood glucose meter kit and supplies KIT Dispense based on patient and insurance preference. Use up to four times daily as directed. (FOR ICD-9 250.00, 250.01). 1 each 0  . Blood Glucose Monitoring Suppl (TRUE METRIX METER) w/Device KIT Use to check your blood glucose twice a day (Patient not taking: Reported on 04/03/2018) 1 kit 0  . empagliflozin (JARDIANCE) 10 MG TABS tablet Take 10 mg by mouth daily. 90 tablet 2  . glucose blood (TRUE METRIX BLOOD GLUCOSE TEST) test strip Use to check your blood glucose twice a day (Patient not taking: Reported on 04/03/2018) 100 each 12  . Insulin Degludec (TRESIBA FLEXTOUCH) 200 UNIT/ML SOPN Inject 30 Units into the skin daily. 1 pen 0  . Insulin Pen Needle (UNIFINE PENTIPS) 31G X 5 MM MISC Use one to inject your insulin once daily (Patient not taking: Reported on 04/03/2018) 100 each  3  . Insulin Syringes, Disposable, U-100 0.3 ML MISC As directed (Patient not taking: Reported on 04/03/2018) 100 each 0  . Lancets (ONETOUCH ULTRASOFT) lancets Use to check blood glucose 2-3 times a day (Patient not taking: Reported on 04/03/2018) 100 each 3  . LANTUS SOLOSTAR 100 UNIT/ML Solostar Pen Inject 40 Units into the skin daily at 10 pm. 2 pen 0  . lisinopril (PRINIVIL,ZESTRIL) 20 MG tablet Take 1 tablet (20 mg total) by mouth daily. 90 tablet 3  . metFORMIN (GLUCOPHAGE-XR) 500 MG 24 hr tablet Take 1 tablet (500 mg total) by mouth daily with breakfast. 90 tablet 2   No current facility-administered medications for this visit.     Functional Status:  In your present state of health, do you have any difficulty performing the following activities: 03/06/2018 01/04/2018  Hearing? N N  Vision? N N  Difficulty concentrating or making decisions? N N  Walking or climbing stairs? Y Y  Dressing or bathing? N N  Doing errands, shopping? N N  Preparing Food and eating ? N N  Using the Toilet? N N  In the past six months, have you accidently leaked urine? N N  Do you have problems with loss of bowel control? N N  Managing your Medications? N N  Managing your Finances? N N  Housekeeping or managing your Housekeeping? N N  Some recent data might be hidden    Fall/Depression Screening: Fall  Risk  05/07/2018 04/20/2018 03/26/2018  Falls in the past year? No No No   PHQ 2/9 Scores 05/07/2018 04/20/2018 03/26/2018 03/06/2018 01/04/2018 11/09/2017 10/18/2017  PHQ - 2 Score 1 0 0 0 0 0 0    Assessment:  Patient does not check blood sugars Patient did not follow through with dentist Patient states he is out of insulin A1C 10.7 Plan:  Patient is to call Dr office for samples Patient will try to locate meter RN will follow up within the month of September for compliance of checking blood sugars and documenting  [place] Management [phone]

## 2018-05-22 ENCOUNTER — Other Ambulatory Visit: Payer: Self-pay | Admitting: Pharmacist

## 2018-05-22 ENCOUNTER — Other Ambulatory Visit: Payer: Self-pay | Admitting: *Deleted

## 2018-05-22 ENCOUNTER — Telehealth: Payer: Self-pay | Admitting: Family Medicine

## 2018-05-22 NOTE — Patient Outreach (Signed)
[place] [facility]) Care Management  05/22/2018  MARRIO SCRIBNER [birth year] [id number]   RN Health Coach attempted #1 follow up outreach call to patient.  Patient was unavailable. Voicemail message was full.  Plan: RN will call patient again within 30 days.  [place] Care Management [phone]

## 2018-05-22 NOTE — Telephone Encounter (Signed)
Change tresiba to basaglar to apply for [name] patient assistance for med costs. Dose will be 30U. Will route to [place] team for help. Unsure where to send the actual rx, will send once I get direction from pharmacy, really appreciate their help.

## 2018-05-22 NOTE — Patient Outreach (Addendum)
[place] [facility]) Care Management  05/22/2018  Charles Fox [birth year] [id number]   Approval received from patient's PCP, Dr. Lindell Noe, to proceed with patient assistance application for long-acting insulin substitution to [facility] for possible cost-savings benefit.  Basaglar manufactured by [employer] which may waive out-of-pocket expenditure requirement for patient assistance if letter of financial hardship submitted.    Unsuccessful call to Charles Fox to provide update to patient and follow-up on medication adherence.I was not able to leave voicemail as the mailbox is full.    Plan: I will route patient assistance letter to pharmacy technician, Etter Sjogren, who will coordinate application process for [place] through [place].  She will assist with obtaining all pertinent documents from both patient and Dr. Lindell Noe and submit application once completed.    I will follow-up again with Charles Fox later this week regarding medication adherence unless I hear back beforehand.   Ralene Bathe, PharmD, [place] [phone]

## 2018-05-23 ENCOUNTER — Other Ambulatory Visit: Payer: Self-pay | Admitting: Pharmacist

## 2018-05-23 ENCOUNTER — Other Ambulatory Visit: Payer: Self-pay | Admitting: Pharmacy Technician

## 2018-05-23 NOTE — Patient Outreach (Signed)
[facility]) Care Management  05/23/2018  OLANREWAJU OSBORN 09-12-1951 [id number]   Unsuccessful call #2 to Mr. Missey today regarding medication assistance and medication adherence.  Voicemail full and I was unable to leave a message.   Plan: I will call patient again next week unless I hear back beforehand.   Ralene Bathe, PharmD, [place] [phone]

## 2018-05-23 NOTE — Patient Outreach (Signed)
[place] [facility]) Care Management  05/23/2018  Charles Fox [birth year] [id number]   Received [place] patient assistance referral from [place] for [employer]. Prepared patient portion to be mailed and faxed provider portion to Dr. Lindell Noe.  Will follow up with patient in 5-7 business days to confirm application has been received.  Maud Deed [place], [place] Management [phone]

## 2018-05-24 NOTE — Telephone Encounter (Signed)
Filled out basaglar application and faxed back to [place].

## 2018-05-28 ENCOUNTER — Ambulatory Visit: Payer: Self-pay | Admitting: Pharmacist

## 2018-06-07 ENCOUNTER — Other Ambulatory Visit: Payer: Self-pay | Admitting: *Deleted

## 2018-06-07 NOTE — Patient Outreach (Signed)
[place] [facility]) Care Management  06/07/2018  SANJEEV MAIN 16-Sep-1951 [id number]   RN Health Coach attempted #2 ollow up outreach call to patient.  Patient was unavailable. Per voicemail message the mailbox is full. Plan: RN will call patient again within 14 days.  [place] Care Management [phone]

## 2018-06-11 ENCOUNTER — Ambulatory Visit: Payer: Self-pay | Admitting: Pharmacist

## 2018-06-11 ENCOUNTER — Other Ambulatory Visit: Payer: Self-pay | Admitting: Pharmacist

## 2018-06-11 NOTE — Patient Outreach (Signed)
[facility]) Care Management  06/11/2018  Charles Fox [birth year] [id number]  67 y.o. year old male referred to [place] for medication assistance.   Care coordination call placed to [employer].  Patient approved for Jardiance until 09/18/2018.  Medication shipped to patient on 05/24/2018, delivered 05/28/2018.    Successful call placed to Charles Fox today to f/u on medications.  Patient reports he is not sure if he received Jardiance.  He also is not sure if he is taking mail order medications.  He states he is taking metformin and a little "red pill."  Per review of markings on pill, determined this is lisinopril 20mg .  Patient states he used "every last dollar" to pay for insulin last week.    3-way call placed to [place] regarding mail order medications.  Per representative, Metformin and Atorvastatin both shipped directly to patient's home on 8/21 + 05/10/2018 respectively.  Rx for lisinopril on hold until it can be filled on 06/22/2018 as patient should have supply of this.  Metformin refill due 10/23, Atorvastatin refill due 10/24.    Patient reports he is not sure where these medications are in his home.  I encouraged him to look around his house for these medications.  Patient states his son, daughter-in-law and 2 children also live with him and may be able to help look.  Patient had 90 day supplhy of Jardiance, metformin, and atorvastatin all delivered in the last month directly to his home.    Patient assistance application for Basaglar still in process.   Plan: I will route note toe Charles Fox pharmacy technician for update on Charles Fox.  I will make home visit next week to patient's home to assist with organizing medications into pillbox.   Charles Fox, PharmD, [place] [phone]

## 2018-06-18 ENCOUNTER — Other Ambulatory Visit: Payer: Self-pay | Admitting: Pharmacist

## 2018-06-18 NOTE — Patient Outreach (Signed)
[place] [facility]) Care Management  06/18/2018  MARCKUS HANOVER 04-12-1951 [id number]  Unsuccessful call placed to Mr. Beswick to confirm home visit appointment today.  I left a HIPAA compliant voicemail.   Plan: I will follow-up with patient again later today.   Addendum Incoming call received from Mr. Meno.  Patient reports he is now at home for home visit.  Confirmed address with patient and time of appointment.   -------------------------------------------------------------------------------------------  Home visit to patient's address listed in [facility].  Son answered door and stated patient was no longer at home.  Unsuccessful call attempts to patient's phone to contact him.    I left the following with patient's son at the home:   7 day pillbox  Medication list  Patient assistance program application with financial hardship letter for [facility] OTC catalogue   Son reports patient only taking 2 medications, metformin and lisinopril.  Son states no other medication bottles currently in the home.  Atorvastatin + metformin from [facility] + Jardiance from PAP should have been delivered within last 30 days.  I spoke with community office staff who state there is not any mail waiting to be delivered to patient.    Care coordination call placed to [place] regarding delivery of mail order medications.  Requested that all medications now require signature with delivery to track product.  New medications will be shipped to patient.   Plan: Follow-up with patient later this week regarding Basaglar PAP application + new shipment of medications.   Will request [facility] pharmacy technician contact Jardiance [employer] to request new shipment with signature upon delivery as well.   Ralene Bathe, PharmD, [place] [phone]

## 2018-06-22 ENCOUNTER — Other Ambulatory Visit: Payer: Self-pay | Admitting: Pharmacist

## 2018-06-22 NOTE — Patient Outreach (Signed)
[facility] [facility]) Care Management  06/22/2018  Charles Fox 04-Feb-1951 [id number]   Successful call placed to Charles Fox.  HIPAA identifiers verified.  Patient reports he has the paperwork that I dropped off for him on Monday but has not had time to review it.  I encouraged him to fill out application and return to [facility] as soon as possible.  Patient voiced understanding.  He reports he will try to drop it off today.  He states he has not received any new shipment of medication yet from mail order or Jardiance.   Plan: Await return of application.  Follow-up with patient next week if not returned.   Ralene Bathe, PharmD, [place] [phone]

## 2018-06-25 ENCOUNTER — Telehealth: Payer: Self-pay

## 2018-06-25 NOTE — Telephone Encounter (Signed)
Pt walked into the office inquiring about his metformin rx. Pt states he was suppose to get the 500mg  Metformin, instead he received the 1000mg . Per chart review this was changed in August. After talking to him more he said he actually wasn't sure which MG he was taking, he just assumed it was the 1000mg  bc it made him feel bad. I instructed pt to go home and check to see what which MG he is actually taking. Pt will call me when knows.

## 2018-06-26 ENCOUNTER — Other Ambulatory Visit: Payer: Self-pay | Admitting: Pharmacist

## 2018-06-26 NOTE — Patient Outreach (Signed)
[place] [facility]) Care Management  06/26/2018  TORIN MODICA 09/29/1950 [id number]   Reason for consult: medication management, medication assistance  Unsuccessful telephone call attempt #1 to patient.   -Unable to leave message.    Plan:  I will make another outreach attempt to patient within 3-4 business days.   Ralene Bathe, PharmD, [place] [phone]

## 2018-07-02 ENCOUNTER — Telehealth: Payer: Self-pay | Admitting: Family Medicine

## 2018-07-02 ENCOUNTER — Other Ambulatory Visit: Payer: Self-pay | Admitting: Pharmacist

## 2018-07-02 DIAGNOSIS — I10 Essential (primary) hypertension: Secondary | ICD-10-CM

## 2018-07-02 DIAGNOSIS — E119 Type 2 diabetes mellitus without complications: Secondary | ICD-10-CM

## 2018-07-02 DIAGNOSIS — Z794 Long term (current) use of insulin: Principal | ICD-10-CM

## 2018-07-02 MED ORDER — ATORVASTATIN CALCIUM 40 MG PO TABS: 40.0000 mg | ORAL_TABLET | Freq: Every day | ORAL | 3 refills | Status: DC

## 2018-07-02 MED ORDER — EMPAGLIFLOZIN 10 MG PO TABS
10.0000 mg | ORAL_TABLET | Freq: Every day | ORAL | 2 refills | Status: DC
Start: 2018-07-02 — End: 2019-04-11

## 2018-07-02 MED ORDER — METFORMIN HCL ER 500 MG PO TB24: 500.0000 mg | ORAL_TABLET | Freq: Every day | ORAL | 2 refills | Status: DC

## 2018-07-02 NOTE — Patient Outreach (Signed)
Charles Fox [facility]) Care Management  [place] 07/02/2018  Charles Fox April 08, 1951 [id number]  Reason for referral: medication assistance, medication management  Communication from Dr. Lindell Noe that new RX for Jardiance sent to [facility] as patient lost medication.    Successful call to [name].    Patient reports he lost the patient assistance application for insulin.  He will stop by [facility] office today to complete a new one.  He reports he is out of this medication and cannot afford a new one.    Patient reports he received metformin and lisinopril through mail order, delivered to his mail box.  He does not have atorvastatin yet though.    Plan: Meet patient at [facility] office at 1:00 PM  Update: Met with [name] at [facility] office.  Patient did not bring proof of income but states he can go to bank and obtain 2 months of income statements to submit.  He completed Heritage manager for [employer] and signed a letter of financial hardship.  Patient provided envelope to return proof of income documents.  He states he will drop them off at [facility] Tuesday or Wednesday this week.   Plan: Await return of proof of income documents.    Ralene Bathe, PharmD, [place] [phone]

## 2018-07-02 NOTE — Telephone Encounter (Signed)
Patient reports to pharmacy team that he lost his mail order medications. Per pharmacy, new prescription be faxed in from the providers letterhead and must include "replacement for lost medication".  Fax # [phone] to [employer]. Will print letter now and fax.

## 2018-07-06 ENCOUNTER — Other Ambulatory Visit: Payer: Self-pay | Admitting: Pharmacist

## 2018-07-06 NOTE — Patient Outreach (Signed)
[place] [facility]) Care Management  [place] 07/06/2018  PASCUAL MANTEL [birth year] [id number]  Reason for referral: medication management, medication assistance  Proof of income documents not yet received at [facility]  Call placed to Mr. Bogden.  Patient reports he has not had a chance to get documents but will deliver them on Monday, Oct 21st.    Plan: F/u return of documents next week  Ralene Bathe, PharmD, [place] [phone]

## 2018-07-09 ENCOUNTER — Ambulatory Visit: Payer: Medicare Other | Admitting: Psychology

## 2018-07-09 ENCOUNTER — Other Ambulatory Visit: Payer: Self-pay

## 2018-07-09 ENCOUNTER — Ambulatory Visit (INDEPENDENT_AMBULATORY_CARE_PROVIDER_SITE_OTHER): Payer: Medicare Other | Admitting: Family Medicine

## 2018-07-09 ENCOUNTER — Encounter: Payer: Self-pay | Admitting: Family Medicine

## 2018-07-09 VITALS — BP 110/58 | HR 78 | Temp 98.2°F | Ht 68.0 in | Wt 248.2 lb

## 2018-07-09 DIAGNOSIS — Z794 Long term (current) use of insulin: Principal | ICD-10-CM

## 2018-07-09 DIAGNOSIS — E119 Type 2 diabetes mellitus without complications: Secondary | ICD-10-CM

## 2018-07-09 DIAGNOSIS — Z594 Lack of adequate food and safe drinking water: Secondary | ICD-10-CM | POA: Insufficient documentation

## 2018-07-09 DIAGNOSIS — Z5941 Food insecurity: Secondary | ICD-10-CM

## 2018-07-09 LAB — GLUCOSE, POCT (MANUAL RESULT ENTRY): POC Glucose: 201 mg/dl — AB (ref 70–99)

## 2018-07-09 LAB — POCT GLYCOSYLATED HEMOGLOBIN (HGB A1C): HBA1C, POC (CONTROLLED DIABETIC RANGE): 8.1 % — AB (ref 0.0–7.0)

## 2018-07-09 MED ORDER — INSULIN GLARGINE 100 UNIT/ML SOLOSTAR PEN: 30.0000 [IU] | PEN_INJECTOR | Freq: Every day | SUBCUTANEOUS | 11 refills | Status: DC

## 2018-07-09 MED ORDER — LANTUS SOLOSTAR 100 UNIT/ML ~~LOC~~ SOPN: 40.0000 [IU] | PEN_INJECTOR | Freq: Every day | SUBCUTANEOUS | 0 refills | Status: DC

## 2018-07-09 NOTE — BH Specialist Note (Signed)
Integrated Behavioral Health Initial Visit  MRN: [id number] Name: Charles Fox  Number of [place] Clinician visits:: 1/6 Session Start time: 10:00 AM  Session End time: 10:15 AM Total time: 15 minutes  Type of Service: [place] Interpretor:No. Interpretor Name and Language: N/A   Warm Hand Off Completed.       SUBJECTIVE: Charles Fox is a 67 y.o. male.  Patient was referred by Dr. Lindell Noe for Food Box program application. Patient reports the following symptoms/concerns: Food insecurity   OBJECTIVE: Mood: Pleasant and Positive and Affect: Appropriate Risk of harm to self or others: No evidence of SI.   LIFE CONTEXT: Family and Social: Lives alone; reports positive relationship with girlfriend  School/Work: Endorsed that he is not currently working  Self-Care: Eats 2 meals a day, sometimes struggles to buy food, eats less than 2 vegetables or fruit a day  GOALS ADDRESSED: Patient will: 1. Increase knowledge and/or ablity of: Food Box program and attend monthly sessions to receive food boxes.   INTERVENTIONS: Interventions utilized: Link to [employer] Assessments completed: Not Needed  ASSESSMENT: Patient currently experiencing food insecurity.    Patient completed application for Food Box program and received 1st food box.   PLAN: 1. Behavioral recommendations: Attend Lunch & Learn Series through [employer] Food Box program at least once a month to receive food box.  2. Referral(s): Community Resources:  Food  Tammi Sou

## 2018-07-09 NOTE — Assessment & Plan Note (Signed)
Checked CBG 2/2 patient can't recall them and feeling "bad." 201 is acceptable. a1c further reduced to 8.1, likely due to having insulin samples. Gave another lantus pen sample while we await repeat mail order shipment, looks like the patient is scheduled for [facility] phone follow up tomorrow, which is appreciated. Asked him to return in 1 month for med review and bring glucometer at that time.

## 2018-07-09 NOTE — Patient Instructions (Signed)
It was a pleasure to see you today! Thank you for choosing [facility] for your primary care. Charles Fox was seen for diabetes.   Our plans for today were:  Bring ALL OF YOUR MEDICINES To your next appointment.   Keep taking the metformin 500mg  daily, lantus 30 units, and jardiance.   Best,  Dr. Lindell Noe

## 2018-07-09 NOTE — Progress Notes (Signed)
   CC: Walk-in needs medication refill  HPI  DM - reports that he never received his medicines in the mail (says he is moving soon). Ran out of Lantus 2 days ago (had to show him the pen pictures to recall name). Was using 30 units he thinks. Feels kinda weak today, peeing more than normal. Thinks his CBG was low yesterday. Feels he has a cold, thinks it started when it started raining. Tells me multiple times that he is "old and can't remember anything." he is enjoying working once a week at the [employer].    ROS: Denies CP, SOB, abdominal pain, dysuria, changes in BMs.   CC, SH/smoking status, and VS noted  Objective: BP (!) 110/58   Pulse 78   Temp 98.2 F (36.8 C) (Oral)   Ht 5\' 8"  (1.727 m)   Wt 248 lb 3.2 oz (112.6 kg)   SpO2 96%   BMI 37.74 kg/m  Gen: NAD, alert, cooperative, and pleasant. HEENT: NCAT, EOMI, PERRL CV: RRR, no murmur Resp: CTAB, no wheezes, non-labored Ext: No edema, warm Neuro: Alert and oriented, Speech clear, No gross deficits  Assessment and plan:  Diabetes mellitus Checked CBG 2/2 patient can't recall them and feeling "bad." 201 is acceptable. a1c further reduced to 8.1, likely due to having insulin samples. Gave another lantus pen sample while we await repeat mail order shipment, looks like the patient is scheduled for [facility] phone follow up tomorrow, which is appreciated. Asked him to return in 1 month for med review and bring glucometer at that time.    Orders Placed This Encounter  Procedures  . Glucose (CBG)  . POCT HgB A1C (CPT 83036)    Meds ordered this encounter  Medications  . Insulin Glargine (LANTUS) 100 UNIT/ML Solostar Pen    Sig: Inject 30 Units into the skin daily.    Dispense:  15 mL    Refill:  11     Ralene Ok, MD, PGY3 07/09/2018 1:46 PM

## 2018-07-10 ENCOUNTER — Other Ambulatory Visit: Payer: Self-pay | Admitting: *Deleted

## 2018-07-10 NOTE — Addendum Note (Signed)
Addended by: Verlin Grills on: 07/10/2018 12:21 PM   Modules accepted: Orders

## 2018-07-10 NOTE — Patient Outreach (Signed)
Charles Fox) Care Management  07/10/2018   RENNE Fox [birth year] [id number]  RN Health Coach telephone call to patient.  [employer] compliance verified. Per patient he has not checked his blood sugar today. Patient stated that the Dr had checked it yesterday. RN asked patient was he checking his blood sugars and patient stated he was checking it everyday. Patient stated that his tags have lapsed and he doesn't have the money to pay for them. Patient is stating that he can't go to the Dr or go for hi appointments or to work or church. Patient stated he is afraid to drive his car and he doesn't know what to do. RN asked the patient what had happened. Patient kept stating that he was old and would be 67 next month. Patient also stated he forgot that he doesn't remember sometimes. RN discussed writing things down.  RN asked the patient did he get the papers to the pharmacist for the medication assistance that she needed to complete the application.  Patient stated he took them to the Dr office yesterday. RN contacted pharmacy and she will follow up on information. Per patient he has cut back on his diet and is not eating salt on food and no greasy foods. Per patient he is going to eat some soup today. Patient stated that he is paying 900.00 and utilities and he can't afford that. Patient stated he is looking for new housing. Patient has agreed to outreach calls.  Current Medications:  Current Outpatient Medications  Medication Sig Dispense Refill  . Alcohol Swabs PADS He is to check blood glucose 2-3 times a day (Patient not taking: Reported on 04/03/2018) 100 each 11  . aspirin 81 MG tablet Take 1 tablet (81 mg total) by mouth daily. 90 tablet 3  . atorvastatin (LIPITOR) 40 MG tablet Take 1 tablet (40 mg total) by mouth daily. 90 tablet 3  . Blood Glucose Calibration (TRUE METRIX LEVEL 2) Normal SOLN Use to check your blood glucose twice a day (Patient not taking: Reported on  04/03/2018) 1 each 11  . blood glucose meter kit and supplies KIT Dispense based on patient and insurance preference. Use up to four times daily as directed. (FOR ICD-9 250.00, 250.01). 1 each 0  . Blood Glucose Monitoring Suppl (TRUE METRIX METER) w/Device KIT Use to check your blood glucose twice a day (Patient not taking: Reported on 04/03/2018) 1 kit 0  . empagliflozin (JARDIANCE) 10 MG TABS tablet Take 10 mg by mouth daily. 90 tablet 2  . glucose blood (TRUE METRIX BLOOD GLUCOSE TEST) test strip Use to check your blood glucose twice a day (Patient not taking: Reported on 04/03/2018) 100 each 12  . Insulin Degludec (TRESIBA FLEXTOUCH) 200 UNIT/ML SOPN Inject 30 Units into the skin daily. 1 pen 0  . Insulin Glargine (LANTUS) 100 UNIT/ML Solostar Pen Inject 30 Units into the skin daily. 15 mL 11  . Insulin Pen Needle (UNIFINE PENTIPS) 31G X 5 MM MISC Use one to inject your insulin once daily (Patient not taking: Reported on 04/03/2018) 100 each 3  . Insulin Syringes, Disposable, U-100 0.3 ML MISC As directed (Patient not taking: Reported on 04/03/2018) 100 each 0  . Lancets (ONETOUCH ULTRASOFT) lancets Use to check blood glucose 2-3 times a day (Patient not taking: Reported on 04/03/2018) 100 each 3  . lisinopril (PRINIVIL,ZESTRIL) 20 MG tablet Take 1 tablet (20 mg total) by mouth daily. 90 tablet 3  . metFORMIN (GLUCOPHAGE-XR) 500 MG 24  hr tablet Take 1 tablet (500 mg total) by mouth daily with breakfast. 90 tablet 2   No current facility-administered medications for this visit.     Functional Status:  In your present state of health, do you have any difficulty performing the following activities: 03/06/2018 01/04/2018  Hearing? N N  Vision? N N  Difficulty concentrating or making decisions? N N  Walking or climbing stairs? Y Y  Dressing or bathing? N N  Doing errands, shopping? N N  Preparing Food and eating ? N N  Using the Toilet? N N  In the past six months, have you accidently leaked urine?  N N  Do you have problems with loss of bowel control? N N  Managing your Medications? N N  Managing your Finances? N N  Housekeeping or managing your Housekeeping? N N  Some recent data might be hidden    Fall/Depression Screening: Fall Risk  07/09/2018 05/07/2018 04/20/2018  Falls in the past year? No No No   PHQ 2/9 Scores 07/09/2018 05/07/2018 04/20/2018 03/26/2018 03/06/2018 01/04/2018 11/09/2017  PHQ - 2 Score 0 1 0 0 0 0 0    Assessment:  A1C is 8.1 Patient did receive a Lantus pen from Dr office yesterday Patient is unable to afford tags for car which causes transportation problem Patient is looking for cheaper housing Patient will continue to benefit from [place] telephonic outreach for education and support for diabetes self management.  Plan:  RN followed up discussion with pharmacist about paper work  RN contacted Education officer, museum and made a referral RN discussed checking blood sugars daily RN instructed patient take meter to the Dr office RN sent 2019/2020 Calendar book for documentation RN will follow up within the month of November  Charles Fox [place] Management [phone]

## 2018-07-10 NOTE — Progress Notes (Signed)
Medication Samples have been provided to the patient.  Drug name: Lantus       Strength: 100U/mL        Qty: 1  LOT: [id number]  Exp.Date: 06/18/2020  Dosing instructions: Inject 30U daily  The patient has been instructed regarding the correct time, dose, and frequency of taking this medication, including desired effects and most common side effects.   Product signed by, Andee Poles, PharmD Candidate.  Charles Fox 8:40 AM 07/10/2018

## 2018-07-11 ENCOUNTER — Other Ambulatory Visit: Payer: Self-pay | Admitting: Pharmacist

## 2018-07-11 ENCOUNTER — Ambulatory Visit: Payer: Self-pay | Admitting: Pharmacist

## 2018-07-11 ENCOUNTER — Other Ambulatory Visit: Payer: Self-pay | Admitting: *Deleted

## 2018-07-11 NOTE — Patient Outreach (Signed)
[facility]) Care Management  [place] 07/11/2018  Charles Fox Nov 26, 1950 [id number]  Reason for referral: medication management, medication assistance  Successful call to Charles Fox today.  HIPAA identifiers verified.  Patient reports he has not been able to get proof of income documents yet.  He plans to go to the bank tomorrow.  He also states he has not received any medication recently from mail order.  He states he is only taking metformin, his "red" pill (lisinopril), and aspirin 81mg . He is using a sample pen of insulin.    3-way call to [facility].  Metformin, lisinopril, and atorvastatin will be shipped to patient tomorrow with expedited shipping.  All medications will be delivered by USPS and will require a signature (no additional cost) moving forward. Patient is not eligible for automatic refills per representative.  Patient provided phone number for OTC catalogue and benefits of catalogue explained to patient.  I offered to call OTC program with patient to order products but patient declined.  He states he does not need anything at this time.  He already had a bottle of aspirin 81mg  from [place] at home.    Call placed to [place] to follow-up on Jardiance re-shipment.  MD re-faxed paperowrk for lost medication on 07/02/2018.   Per representative, jardiance will re-ship on 07/12/2018 (USPS).  Should be received within 7 business days (will have tracking number by Tuesday).    Plan: I will follow-up with patient in 1-2 weeks to ensure all 4 medications have arrived.    Ralene Bathe, PharmD, [place] [phone]

## 2018-07-11 NOTE — Patient Outreach (Signed)
[facility]) Care Management  07/11/2018  Charles Fox 01-16-1951 [id number]   Patient referred to this social worker by the [place] to assist patient with resources to pay his car tags. Per referral, patient is unable to drive to his medical appointments because his car tags have expired. Financial assessment completed. Patient's son and his family recently moved out.Patient has received the  list of low income senior apartments provided previously, and is on the waiting list at this time to a preferred apartment identified on the list. Patient wiling to make modifications to his cable to save on expenses and has reapplied for food stamps that has recently been approved however he has not received the funds yet. Per patient, he is afraid to drive on expired tags and states that he will update his tags on the first when he gets his check. This Education officer, museum encouraged patient to re-new his tags as soon as possible to ensure follow up with his doctor's and to continue to look into modifying his spending where he can. This Education officer, museum applauded patient for completing and submitting housing and food stamp application.   Plan: This Education officer, museum will provide patient with a gift card for [place], [place] Care Management [phone]

## 2018-07-12 ENCOUNTER — Other Ambulatory Visit: Payer: Self-pay | Admitting: *Deleted

## 2018-07-12 NOTE — Patient Outreach (Signed)
[facility]) Care Management  [place]  07/12/2018   Charles Fox 05-Dec-1950 [id number]  Subjective: Patient is a 67 year old male. Patient states that his care tags have expired and he does not like to drive fearing that he will be pulled over by the police.  Patient does not have any medical appointments coming up, however states that he should be able to re-new his tags by 07/20/18 when he gets his next social security check. Per patient, he works at the [employer] 1 time per week and receives social security/disability. Patient states that he is able to  take care of his own ADL's and IADL's, however reports  that he is low on food at this time and will be behind even further if he has to re-new his car tags.  Objective:   Encounter Medications:  Outpatient Encounter Medications as of 07/12/2018  Medication Sig  . Alcohol Swabs PADS He is to check blood glucose 2-3 times a day (Patient not taking: Reported on 04/03/2018)  . aspirin 81 MG tablet Take 1 tablet (81 mg total) by mouth daily.  [name] atorvastatin (LIPITOR) 40 MG tablet Take 1 tablet (40 mg total) by mouth daily.  . Blood Glucose Calibration (TRUE METRIX LEVEL 2) Normal SOLN Use to check your blood glucose twice a day (Patient not taking: Reported on 04/03/2018)  . blood glucose meter kit and supplies KIT Dispense based on patient and insurance preference. Use up to four times daily as directed. (FOR ICD-9 250.00, 250.01).  . Blood Glucose Monitoring Suppl (TRUE METRIX METER) w/Device KIT Use to check your blood glucose twice a day (Patient not taking: Reported on 04/03/2018)  . empagliflozin (JARDIANCE) 10 MG TABS tablet Take 10 mg by mouth daily.  [name] glucose blood (TRUE METRIX BLOOD GLUCOSE TEST) test strip Use to check your blood glucose twice a day (Patient not taking: Reported on 04/03/2018)  . Insulin Degludec (TRESIBA FLEXTOUCH) 200 UNIT/ML SOPN Inject 30 Units into the skin daily.  . Insulin  Glargine (LANTUS) 100 UNIT/ML Solostar Pen Inject 30 Units into the skin daily.  . Insulin Pen Needle (UNIFINE PENTIPS) 31G X 5 MM MISC Use one to inject your insulin once daily (Patient not taking: Reported on 04/03/2018)  . Insulin Syringes, Disposable, U-100 0.3 ML MISC As directed (Patient not taking: Reported on 04/03/2018)  . Lancets (ONETOUCH ULTRASOFT) lancets Use to check blood glucose 2-3 times a day (Patient not taking: Reported on 04/03/2018)  . lisinopril (PRINIVIL,ZESTRIL) 20 MG tablet Take 1 tablet (20 mg total) by mouth daily.  . metFORMIN (GLUCOPHAGE-XR) 500 MG 24 hr tablet Take 1 tablet (500 mg total) by mouth daily with breakfast.   No facility-administered encounter medications on file as of 07/12/2018.     Functional Status:  In your present state of health, do you have any difficulty performing the following activities: 03/06/2018 01/04/2018  Hearing? N N  Vision? N N  Difficulty concentrating or making decisions? N N  Walking or climbing stairs? Y Y  Dressing or bathing? N N  Doing errands, shopping? N N  Preparing Food and eating ? N N  Using the Toilet? N N  In the past six months, have you accidently leaked urine? N N  Do you have problems with loss of bowel control? N N  Managing your Medications? N N  Managing your Finances? N N  Housekeeping or managing your Housekeeping? N N  Some recent data might be hidden  Fall/Depression Screening: Fall Risk  07/09/2018 05/07/2018 04/20/2018  Falls in the past year? No No No   PHQ 2/9 Scores 07/09/2018 05/07/2018 04/20/2018 03/26/2018 03/06/2018 01/04/2018 11/09/2017  PHQ - 2 Score 0 1 0 0 0 0 0    Assessment: Patient provided with a gift card through the [employer] to assist with food cost until the first when he can get his tags renewed.  This social worker reviewed patient's finances, discussed the possibility of moving to a more affordable apartment and other ways to modify his expenses. Patient also encouraged to   follow up with all medical appointments.   Plan: This Education officer, museum will follow up with patient within the next 2 weeks regarding the renewal of his car tags.   Sheralyn Boatman [facility] Care Management [phone]

## 2018-07-16 ENCOUNTER — Ambulatory Visit: Payer: Medicare Other | Admitting: *Deleted

## 2018-07-17 ENCOUNTER — Telehealth: Payer: Self-pay | Admitting: *Deleted

## 2018-07-17 NOTE — Telephone Encounter (Signed)
Pt calls.  He has been having stomach pains x 3 days. He denies diarrhea, vomiting or fever. He is having regular BM with the most recent one this am.   Pt called EMS last night and they preformed an EKG which they told him was normal.   Appt made for Thursday am.  He will call back or go to [facility] / ED if pain gets worse.   Solomia Harrell, Salome Spotted, CMA

## 2018-07-18 ENCOUNTER — Ambulatory Visit: Payer: Self-pay | Admitting: Pharmacist

## 2018-07-18 ENCOUNTER — Other Ambulatory Visit: Payer: Self-pay | Admitting: Pharmacist

## 2018-07-18 NOTE — Patient Outreach (Signed)
Charles Fox) Care Management  [place] 07/18/2018  Charles Fox 1950-11-06 [id number]  Reason for call: f/u on medication delivery from mail order pharmacy and from patient assistance company  Successful call to Mr. Monnier.   1) Medication adherence:   -Patient reports that he received metformin and Jardiance in the mail but does NOT have atorvastatin or lisinopril.  These two medications should have been delivered together with metformin.  He denies misplacing them around his house.  I offered to call patient's insurance to follow-up on tracking however patient declines as he has to use the bathroom.  He states he will call me back later this afternoon to make the call together.    -Insulin: Patient has still not provided proof of income to complete insulin patient assistance application.  He continues to state that his insulin has not been shipped and I remind patient of necessary paper work needed.  Patient voiced understanding.   Plan: Await call back from patient to follow-up on lisinopril and atorvastatin.    Ralene Bathe, PharmD, [place] [phone]

## 2018-07-19 ENCOUNTER — Ambulatory Visit: Payer: Medicare Other | Admitting: Family Medicine

## 2018-07-19 ENCOUNTER — Other Ambulatory Visit: Payer: Self-pay | Admitting: Pharmacist

## 2018-07-19 ENCOUNTER — Ambulatory Visit: Payer: Self-pay | Admitting: Pharmacist

## 2018-07-19 NOTE — Patient Outreach (Signed)
[facility]) Care Management  [place]  07/19/2018  STEPHANIE MCGLONE 01-23-1951 [id number]   Reason for call: f/u on lisinopril and atorvastatin  Patient did not return my call yesterday.   Noted cancelled appointment at [facility] this morning.    Unsuccessful telephone call attempt #1 to patient.   Unable to leave message  Plan:  I will make another outreach attempt to patient within 3-4 business days  Ralene Bathe, PharmD, [place] [phone]

## 2018-07-23 ENCOUNTER — Other Ambulatory Visit: Payer: Self-pay | Admitting: Family Medicine

## 2018-07-23 ENCOUNTER — Other Ambulatory Visit: Payer: Self-pay | Admitting: *Deleted

## 2018-07-23 ENCOUNTER — Ambulatory Visit: Payer: Self-pay | Admitting: Pharmacist

## 2018-07-23 ENCOUNTER — Other Ambulatory Visit: Payer: Self-pay | Admitting: Pharmacist

## 2018-07-23 MED ORDER — INSULIN GLARGINE 100 UNIT/ML SOLOSTAR PEN: 30.0000 [IU] | PEN_INJECTOR | Freq: Every day | SUBCUTANEOUS | 11 refills | Status: DC

## 2018-07-23 MED ORDER — INSULIN DEGLUDEC 200 UNIT/ML ~~LOC~~ SOPN: 30.0000 [IU] | PEN_INJECTOR | Freq: Every day | SUBCUTANEOUS | 0 refills | Status: DC

## 2018-07-23 MED FILL — LANTUS SOLOSTAR 100 UNITS/M: 100 | 30 days supply | Qty: 9 | Fill #0

## 2018-07-23 NOTE — Telephone Encounter (Signed)
Thanks for the help team. FYI this patient has some cognitive impairments with med management and has been getting samples recently as he reports having no cash at all. In case he calls back with additional questions.

## 2018-07-23 NOTE — Patient Outreach (Signed)
[place] [facility]) Care Management  [place]  07/23/2018  Charles Fox [birth year] [id number]   Reason for call: f/u on lisinopril and atorvastatin  Unsuccessful telephone call attempt #2 to patient.   Unable to leave message  Plan:  I will make another outreach attempt to patient within 3-4 business days   Ralene Bathe, PharmD, [place] [phone]

## 2018-07-23 NOTE — Addendum Note (Signed)
Addended by: Talbert Cage L on: 07/23/2018 11:16 AM   Modules accepted: Orders

## 2018-07-23 NOTE — Telephone Encounter (Signed)
Spoke to Dr. Erin Hearing. Refills sent to [facility]. Called pharmacy. Rx has not been received yet but they will keep an eye out for it. Ottis Stain, CMA

## 2018-07-23 NOTE — Telephone Encounter (Signed)
[facility] called back. Refill was set to sample. Called into pharmacist verbally.  Danley Danker, RN [facility] RN)

## 2018-07-23 NOTE — Patient Outreach (Signed)
[place]) Care Management  07/23/2018  Charles Fox 04-Jul-1951 [id number]   Phone call to patient to follow up on the status of obtaining his car tags. Per patient, he ws able to obtain his car tags on 07/20/18. Patient appreciative of the gift card provided to purchase food. Per patient, he is not focused on trying to find a more affordable place to live. Low income housing list provided to patient previously. Patient states that he has already contacted the [employer]. The contact number ws provided to patient for Public Housing and section *. [phone].  Patient verbalized having no additional social work needs at this time. This Education officer, museum provided patient with my contact information if there are any needs that arise in the future.   Sheralyn Boatman [facility] Care Management [phone]

## 2018-07-23 NOTE — Telephone Encounter (Signed)
Pt would like call his insulin. Please call into [facility]. He has a ride and he is here now. If he leaves he can not get back here and needs his insulin. jw

## 2018-07-24 ENCOUNTER — Other Ambulatory Visit: Payer: Self-pay | Admitting: Pharmacy Technician

## 2018-07-24 NOTE — Patient Outreach (Signed)
[place] [facility]) Care Management  07/24/2018  Charles Fox 07-05-1951 [id number]   Proof of income document was brought into [facility] office. Faxed document to [employer] patient assistance.  Will follow up with company in 3-5 business days to check status of application.  Maud Deed Chana Bode [place] Certified Pharmacy Technician [place] Management Direct Dial:[phone]

## 2018-07-26 ENCOUNTER — Other Ambulatory Visit: Payer: Self-pay | Admitting: Pharmacist

## 2018-07-26 ENCOUNTER — Ambulatory Visit: Payer: Self-pay | Admitting: Pharmacist

## 2018-07-26 NOTE — Patient Outreach (Signed)
[facility]) Care Management  [place] 07/26/2018  ABBOTT JASINSKI 09/11/1951 [id number]  Reason for call: f/u on lisinopril and atorvastatin  Successful call to Mr. Maybee today. Patient reports he is not able to stay on the line today to make 3 way call to his insurance company to inquire about missing lisinopril and atorvastatin. He states he is taking Jardiance and metformin.  Patient denies receiving any of my previous voicemails requesting a return call.    I reviewed criteria for [facility] services and that member participation is required.  I encouraged Mr. Cipollone to follow-up with me when he is able so that we can try to find out what happened to these medications that were shipped to him 2x by his mail order pharmacy but that he has misplaced.  He states he will call me later today.  He explained that I will be happy to help him when he is ready however that I will no longer be reaching out to him.  It is his responsibility to call me if he would like further [facility] pharmacy services.   Patient voiced understanding.   Plan: [place] technician to follow-up on medication assistance per notes.    Ralene Bathe, PharmD, [place] [phone]

## 2018-08-10 ENCOUNTER — Other Ambulatory Visit: Payer: Self-pay | Admitting: Pharmacy Technician

## 2018-08-10 NOTE — Patient Outreach (Signed)
[facility]) Care Management  08/10/2018  Charles Fox 1951-08-05 [id number]   Follow up call to [facility] to check status of application for [place]. Margretta Ditty confirms patient was approved as of 11/21 until 09/18/2018 and medication should arrive at providers office in 5-10 business days.  Will follow up with patient in 10-14 business days to confirm medication has been received.  Maud Deed Chana Bode [place] Certified Pharmacy Technician [place] Management Direct Dial:[phone]

## 2018-08-13 ENCOUNTER — Other Ambulatory Visit: Payer: Self-pay | Admitting: *Deleted

## 2018-08-13 NOTE — Patient Outreach (Addendum)
[place] [facility]) Care Management  08/13/2018   Charles Fox [birth year] [id number]  RN Health Coach telephone call to patient.  [employer] compliance verified.  Per patient he stated that his blood sugar is around 170 fasting. Patient stated he is checking his blood sugar once a day. Patient stated that he is almost out of insulin. He has one more day. Per patient when he was running out of insulin he stated that he wouldn't be able to afford any until he got paid. Patient went to the Dr office and gotten some insulin and did not go ahead and purchase any when he got his check to carry him through. Now he is running low again. RN told the patient to notify his Dr office to see if they have any. RN noted that the patient A1C is coming down. RN has discussed with patient that he was going to get some insulin the 1st of the month when he got his check. Since he got it from the Dr office so he got the tags updated on his car . Patient has agreed to follow up outreach calls.    Current Medications:  Current Outpatient Medications  Medication Sig Dispense Refill  . Alcohol Swabs PADS He is to check blood glucose 2-3 times a day (Patient not taking: Reported on 04/03/2018) 100 each 11  . aspirin 81 MG tablet Take 1 tablet (81 mg total) by mouth daily. 90 tablet 3  . atorvastatin (LIPITOR) 40 MG tablet Take 1 tablet (40 mg total) by mouth daily. 90 tablet 3  . Blood Glucose Calibration (TRUE METRIX LEVEL 2) Normal SOLN Use to check your blood glucose twice a day (Patient not taking: Reported on 04/03/2018) 1 each 11  . blood glucose meter kit and supplies KIT Dispense based on patient and insurance preference. Use up to four times daily as directed. (FOR ICD-9 250.00, 250.01). 1 each 0  . Blood Glucose Monitoring Suppl (TRUE METRIX METER) w/Device KIT Use to check your blood glucose twice a day (Patient not taking: Reported on 04/03/2018) 1 kit 0  . empagliflozin (JARDIANCE) 10 MG TABS tablet  Take 10 mg by mouth daily. 90 tablet 2  . glucose blood (TRUE METRIX BLOOD GLUCOSE TEST) test strip Use to check your blood glucose twice a day (Patient not taking: Reported on 04/03/2018) 100 each 12  . Insulin Degludec (TRESIBA FLEXTOUCH) 200 UNIT/ML SOPN Inject 30 Units into the skin daily. 6 pen 0  . Insulin Glargine (LANTUS) 100 UNIT/ML Solostar Pen Inject 30 Units into the skin daily. 15 mL 11  . Insulin Pen Needle (UNIFINE PENTIPS) 31G X 5 MM MISC Use one to inject your insulin once daily (Patient not taking: Reported on 04/03/2018) 100 each 3  . Insulin Syringes, Disposable, U-100 0.3 ML MISC As directed (Patient not taking: Reported on 04/03/2018) 100 each 0  . Lancets (ONETOUCH ULTRASOFT) lancets Use to check blood glucose 2-3 times a day (Patient not taking: Reported on 04/03/2018) 100 each 3  . lisinopril (PRINIVIL,ZESTRIL) 20 MG tablet Take 1 tablet (20 mg total) by mouth daily. 90 tablet 3  . metFORMIN (GLUCOPHAGE-XR) 500 MG 24 hr tablet Take 1 tablet (500 mg total) by mouth daily with breakfast. 90 tablet 2   No current facility-administered medications for this visit.     Functional Status:  In your present state of health, do you have any difficulty performing the following activities: 08/13/2018 03/06/2018  Hearing? N N  Vision? N N  Difficulty concentrating or making decisions? N N  Walking or climbing stairs? Y Y  Dressing or bathing? N N  Doing errands, shopping? N N  Preparing Food and eating ? N N  Using the Toilet? N N  In the past six months, have you accidently leaked urine? N N  Do you have problems with loss of bowel control? N N  Managing your Medications? N N  Managing your Finances? N N  Housekeeping or managing your Housekeeping? N N  Some recent data might be hidden    Fall/Depression Screening: Fall Risk  07/09/2018 05/07/2018 04/20/2018  Falls in the past year? No No No   PHQ 2/9 Scores 07/09/2018 05/07/2018 04/20/2018 03/26/2018 03/06/2018 01/04/2018 11/09/2017   PHQ - 2 Score 0 1 0 0 0 0 0   THN CM Care Plan Problem One     Most Recent Value  Care Plan Problem One  Knowledge Deficit in Self Management of Diabetes  Role Documenting the Problem One  [place] for Problem One  Active  THN Long Term Goal   Patient will report a decrease in A1C from 8.1 within the next 90 days  THN Long Term Goal Start Date  08/13/18  Interventions for Problem One Long Term Goal  Rn discussed the importance of patient checking his blood sugars. RN has dsicussed how the fasting blood sugar affects the  A1C . RN will follow up with further discussion  THN CM Short Term Goal #1   Patient will understand and verbalize the signs and symptoms of hypo and hyperglycemia within the next 30 days  THN CM Short Term Goal #1 Start Date  08/13/18  [facility] CM Short Term Goal #1 Met Date  08/13/18  THN CM Short Term Goal #2   Patient report checking and document blood sugars daily within the next 30 days  Interventions for Short Term Goal #2  RN reiterated that he is checking his blood sugar. Patient stated it is around 170. Patient doesn't give actual number without additional proding. RN will follow up for compliance  THN CM Short Term Goal #3  Patient will report better eating habits within the next 30 days  THN CM Short Term Goal #3 Start Date  08/13/18  Interventions for Short Tern Goal #3  RN discussed eating healthy. RN will follow up with further discussions. RN had referred patient to Education officer, museum for Pension scheme manager. RN will follow up for compliance       Assessment:  Fasting blood sugar is 170 A1C 8.1 Per patient he is running low on insulin.  Patient will continue to benefit from Health Coach telephonic outreach for education and support for diabetes self management.   Plan:  Rn discussed with patient regarding checking his blood sugar RN discussed with patient about his insulin Patient will follow up with Dr office for samples of insulin RN will follow  up within the month of January  Charles Fox [place] Management [phone]

## 2018-08-15 ENCOUNTER — Telehealth: Payer: Self-pay | Admitting: Family Medicine

## 2018-08-15 NOTE — Telephone Encounter (Signed)
Pt informed to come pick up medications. Zach Tietje, Salome Spotted, CMA

## 2018-08-15 NOTE — Telephone Encounter (Signed)
Pt came to pick up meds.   Medication: Kara [name] Lot #: [id number] a Exp: 10/20/19  Zoeann Mol, Salome Spotted, [place]

## 2018-08-15 NOTE — Telephone Encounter (Signed)
Rn team, Herbie [place] let me know that we got Charles Fox insulin samples in. Could we please call the patient and see if he can come into RN clinic to pick it up and sign out the samples?   I am coping the [facility] team who was helping get the samples sent to [place] so they are aware.

## 2018-08-22 ENCOUNTER — Telehealth: Payer: Self-pay | Admitting: Family Medicine

## 2018-08-22 NOTE — Telephone Encounter (Signed)
Received fax from [place] for lilly cares enrollment, patient will need to reapply next year it appears. I will forward to [facility] pharmacists who are helping with this patient, much appreciate the help.

## 2018-08-24 ENCOUNTER — Other Ambulatory Visit: Payer: Self-pay | Admitting: Pharmacist

## 2018-08-24 ENCOUNTER — Ambulatory Visit: Payer: Self-pay | Admitting: Pharmacist

## 2018-08-24 NOTE — Patient Outreach (Signed)
Charles Fox) Care Management  [place] 08/24/2018  Charles Fox June 17, 1951 [id number]  Reason for call: medication assistance and medication management  Successful call to Charles Fox today to verify he has picked up Basaglar medication from provider office.  Patient reports he is using 30 units daily and not having any issues.  He reports CBG yesterday 122 in the evening before dinner.  He is not checking more than 1x day.  I encouraged patient to keep a log of his CBGs.  Patient states he has all medications except Lipitor and does not remember this medication arriving from the mail order.  He does not have time to call insurance today regarding missing medication (2nd time patient reports medication not received).  Patient agreeable to come to Charles Fox office to sign paperwork for 5465 applications for Jardiance and Basaglar next week.  Voices understanding to bring in bank statements again to verify income.    Plan: Meet with patient next Tuesday at Charles Fox at 10:00 AM to complete patient assistance applications and call insurance regarding Lipitor.   Charles Fox, PharmD, [place] [phone]

## 2018-08-28 ENCOUNTER — Ambulatory Visit: Payer: Self-pay | Admitting: Pharmacist

## 2018-08-28 ENCOUNTER — Other Ambulatory Visit: Payer: Self-pay | Admitting: Pharmacist

## 2018-08-28 NOTE — Patient Outreach (Signed)
[place]) Care Management  [place] 08/28/2018  Charles Fox [birth year] [id number]  Reason for call: Patient assistance  NO SHOW by patient for office visit to complete paperwork for patient assistance applications for 0044 for Jardiance + Basaglar.    Call placed to Mr. Kann.  Patient reports he forgot about appointment.  He cannot come today because he is low on gas.  He reports he has his bank statements and will try to come by office later this week.    Plan: Applications left with [facility] administrative assistance who will have patient sign paperwork when he arrives.    Ralene Bathe, PharmD, [place] [phone]

## 2018-08-29 ENCOUNTER — Telehealth: Payer: Self-pay | Admitting: Family Medicine

## 2018-08-29 NOTE — Telephone Encounter (Signed)
White team, please call patient and have him scheduled for first available white team appointment sometime this month. He was supposed to follow up in November for DM. Please ask him to bring his meter with him. He could also see Dr. [name] if that schedule is more available.

## 2018-08-29 NOTE — Telephone Encounter (Signed)
Received paperwork for 3533 Lillycares application. Will write for current basaglar dosing at 30U daily, although this will likely change with time.

## 2018-08-30 NOTE — Telephone Encounter (Signed)
LVM to call office to inform pt that he is due to come in for diabetes check and when he comes to bring his meter with him.  Please assist in getting this scheduled either with PCP or Dr. [name].  If PCP is not available then with someone on white team please. Katharina Caper, April D, [place]

## 2018-09-04 ENCOUNTER — Ambulatory Visit: Payer: Self-pay | Admitting: Pharmacist

## 2018-09-04 ENCOUNTER — Other Ambulatory Visit: Payer: Self-pay | Admitting: Pharmacist

## 2018-09-04 NOTE — Patient Outreach (Signed)
[facility]) Care Management  [place] 09/04/2018  KIAN OTTAVIANO 10/06/1950 [id number]  Reason for call: f/u on medication assistance applications  Successful call to Mr. Dock.  Patient reports he dropped off bank statements to [facility] but was not able to sign paperwork for 2020 Basaglar and Jardiance patient assistance programs.  Patient reports he will try to stop by [facility] office later this morning.   Care coordination call to [place] technician who will have paperwork ready for patient to complete.   Ralene Bathe, PharmD, [place] [phone]

## 2018-09-21 ENCOUNTER — Telehealth: Payer: Self-pay | Admitting: Family Medicine

## 2018-09-21 NOTE — Telephone Encounter (Signed)
Contacted pt and appointment scheduled for 10/11/18 @ 2:50pm with PCP. Katharina Caper, April D, [place]

## 2018-09-21 NOTE — Telephone Encounter (Signed)
White team or RN team, can you help with this? I received a message from [place] team that they never got the [employer] signed paperwork back for this patient. I signed it once on 12/11 and placed it in the fax pile. I got a second fax back around 12/23 saying that they never got the first one, and re-filled it out and replaced it in the fax pile again sometime around 12/23-12/26. Can we see where those originals went? Did the faxes go through? I am copying [place] team as well.

## 2018-09-22 NOTE — Telephone Encounter (Signed)
Feel free to leave the paperwork in Dr. Graylin Shiver office as well; I'll pick it up and take it back to [place] at [facility] when I'm in clinic this week. Thanks!  Catie Darnelle Maffucci, PharmD PGY2 Ambulatory Care Pharmacy Resident, [facility] Phone: [phone]

## 2018-09-24 NOTE — Telephone Encounter (Signed)
I checked the previously faxed pile from Dec 23 - Dec 27 and do not see the form.  Fleeger, Salome Spotted, CMA

## 2018-09-26 NOTE — Telephone Encounter (Signed)
Looks like it just got scanned in the past day or so.  Great!  Thanks Barnetta Chapel and Dr. Lindell Noe. Terrianna [place], Salome Spotted, CMA

## 2018-09-26 NOTE — Telephone Encounter (Signed)
Team,   We just noticed that the forms had been uploaded to the patient's Media Tab on 09/21/2017. They have been printed and will be faxed into to [employer] cares by Etter Sjogren, CPhT.   OK to disregard previous messages and shred copies that I left in Dr. Rosario Jacks box today.   Thanks to everyone for their help!!  Catie Darnelle Maffucci, PharmD PGY2 Ambulatory Care Pharmacy Resident, [place] Phone: [phone]

## 2018-09-26 NOTE — Telephone Encounter (Signed)
Barnetta Chapel brought form back over.    It has been placed in Dr. Toni Amend box.  Once completed please return to RN clinic and we will get it to [place].  Fleeger, Salome Spotted, CMA

## 2018-10-01 ENCOUNTER — Telehealth: Payer: Self-pay | Admitting: Family Medicine

## 2018-10-01 NOTE — Telephone Encounter (Signed)
[facility] pharmacy team - I received the attached fax today. Looks like [name] was approved!

## 2018-10-03 ENCOUNTER — Other Ambulatory Visit: Payer: Self-pay | Admitting: Pharmacy Technician

## 2018-10-03 NOTE — Patient Outreach (Signed)
[facility]) Care Management  10/03/2018  Charles Fox [birth year] [id number]    Follow up call placed to [employer] regarding patient assistance application(s) for [name] confirms patient has been approved as of 1/13 unti 09/19/2019. Patient will need to call and request refill when he is down to about 2 weeks iof current supply.    Follow up call placed to [facility] regarding patient assistance application(s) for [name] confirms that patient has been approved as of 1/14 until 09/19/2019. 3 boxes will be shipped out to providers office 1/16 and should arrive in 7-10 business days.    Successful call placed to patient regarding patient assistance update for Basaglar and Jardiance, HIPAA identifiers verified. Informed [name] of information above and requested that he contact me if he runs into any issues obtaining his refills.  Will route note to [place] for follow up.   Maud Deed Chana Bode [place] Head Certified Pharmacy Technician [place] Management Direct Dial:[phone]

## 2018-10-05 ENCOUNTER — Telehealth: Payer: Self-pay | Admitting: *Deleted

## 2018-10-05 NOTE — Telephone Encounter (Signed)
I received this patients meds from [facility] today. It is in the med fridge if someone wants to call and let him know he can come pick it up.

## 2018-10-08 NOTE — Telephone Encounter (Signed)
Called patient to let him know that his insulin was delivered to the office this week.  Asked him to call back to let us know whether he has enough to last until his appointment on 1/23, also reminded him of this appointment.  If he calls back at please let the nurses know and they could bring him in this afternoon to dispense his insulin to him.

## 2018-10-11 ENCOUNTER — Ambulatory Visit (INDEPENDENT_AMBULATORY_CARE_PROVIDER_SITE_OTHER): Payer: Medicare Other | Admitting: Family Medicine

## 2018-10-11 ENCOUNTER — Other Ambulatory Visit: Payer: Self-pay

## 2018-10-11 ENCOUNTER — Encounter: Payer: Self-pay | Admitting: Family Medicine

## 2018-10-11 VITALS — BP 130/64 | HR 80 | Temp 98.8°F | Ht 68.0 in | Wt 252.0 lb

## 2018-10-11 DIAGNOSIS — Z794 Long term (current) use of insulin: Secondary | ICD-10-CM

## 2018-10-11 DIAGNOSIS — E119 Type 2 diabetes mellitus without complications: Secondary | ICD-10-CM

## 2018-10-11 DIAGNOSIS — Z23 Encounter for immunization: Secondary | ICD-10-CM

## 2018-10-11 LAB — POCT GLYCOSYLATED HEMOGLOBIN (HGB A1C): HbA1c, POC (controlled diabetic range): 7 % (ref 0.0–7.0)

## 2018-10-11 NOTE — Patient Instructions (Signed)
It was a pleasure to see you today! Thank you for choosing [facility] for your primary care. Charles Fox was seen for diabetes.   Our plans for today were:  Keep your diabetes medicines the same, you are doing well!   Call me back if you want to try the medicine for the foot cramping.   Please get your eyes checked.    Best,  Dr. Lindell Noe

## 2018-10-11 NOTE — Assessment & Plan Note (Signed)
Improved control with [facility] providing free medication and [place] dispensing it. Grateful for that. Patient still with limited understanding. No lows that he knows of. Continue current plan, foot exam performed today, asked him to get to the eye doctor. He declined pneumonia vaccine.

## 2018-10-11 NOTE — Progress Notes (Signed)
   CC: DM fu   HPI  DM - feeling well. Doing lots of walking for exercises. CBGs at home was 305 a couple of days ago, no CBG today. Thinks his machine is messing up, but didn't bring it today. His girlfriend is helping him. Hasn't been to the eye doctor. Forgot what a1c was. Has tried to decrease his oral sugar intake.   Says he will only get one vaccine today.   ROS: Denies CP, SOB, abdominal pain, dysuria, changes in BMs.   CC, SH/smoking status, and VS noted  Objective: BP 130/64   Pulse 80   Temp 98.8 F (37.1 C) (Oral)   Ht 5\' 8"  (1.727 m)   Wt 252 lb (114.3 kg)   SpO2 98%   BMI 38.32 kg/m  Gen: NAD, alert, cooperative, and pleasant. HEENT: NCAT, EOMI, PERRL CV: RRR, no murmur Resp: CTAB, no wheezes, non-labored Ext: No edema, warm Neuro: Alert and oriented, Speech clear, No gross deficits Diabetic Foot Check -  Appearance - no lesions, ulcers or calluses Skin - no unusual pallor or redness Monofilament testing -  Right - Great toe, medial, central, lateral ball and posterior foot intact Left - Great toe, medial, central, lateral ball and posterior foot intact  Assessment and plan:  Diabetes mellitus Improved control with [facility] providing free medication and [place] dispensing it. Grateful for that. Patient still with limited understanding. No lows that he knows of. Continue current plan, foot exam performed today, asked him to get to the eye doctor. He declined pneumonia vaccine.    Orders Placed This Encounter  Procedures  . Flu Vaccine QUAD 36+ mos IM  . HgB A1c    No orders of the defined types were placed in this encounter.    Ralene Ok, MD, PGY3 10/11/2018 7:38 PM

## 2018-10-18 ENCOUNTER — Telehealth: Payer: Self-pay | Admitting: Family Medicine

## 2018-10-18 ENCOUNTER — Other Ambulatory Visit: Payer: Self-pay | Admitting: Pharmacist

## 2018-10-18 MED ORDER — ONETOUCH VERIO W/DEVICE KIT: PACK | 0 refills | Status: DC

## 2018-10-18 MED FILL — ONETOUCH VERIO METER SYSTEM: W/DEVICE | 30 days supply | Qty: 1 | Fill #0

## 2018-10-18 NOTE — Telephone Encounter (Signed)
Patient reports his meter is broken, we will send a new script.

## 2018-10-18 NOTE — Patient Outreach (Signed)
[facility]) Care Management [place]  10/18/2018  Charles Fox Sep 01, 1951 [id number]  Follow-up call to Charles Fox for medication assistance / medication adherence.   Patient has been approved for Scientist, product/process development for 2020 and has medications in the home.  Patient reports he understands he needs to call in refills for these medications as well as to mail order pharmacy for remaining medications.  He reports not feeling well today due to weakness and a cold.  He states he has not had an appetite lately and is not sure what his CBGs are due to meter malfunctioning.  He states he has not checked his blood sugar for many days.  Per review of CHL, A1C has improved from 8.1--> 7.0 in the last 3 months.     Plan: I will contact patient's PCP to request a new meter be called into mail order pharmacy and close [facility] pharmacy case.   I encouraged patient to stay up-to-date on his refills to avoid running out of medication.  Reminded patient that he will receive a telephone call from [place] tomorrow to review diabetes management.   I am happy to assist in the future as needed with any medication related needs for Charles Fox.    Thank you for allowing [facility] pharmacy to be involved in this patient's care.    Ralene Bathe, PharmD, [place] [phone]

## 2018-10-19 ENCOUNTER — Other Ambulatory Visit: Payer: Self-pay | Admitting: *Deleted

## 2018-10-19 NOTE — Patient Outreach (Signed)
[facility]) Care Management  10/19/2018  Charles Fox 24-Jan-1951 [id number]  RN Health Coach attempted follow up outreach call to patient.  Patient was unavailable. HIPPA compliance voicemail message left with return callback number.  Plan: RN will call patient again within 30 days.  [place] Care Management [phone]

## 2018-11-05 ENCOUNTER — Ambulatory Visit: Payer: Medicare Other

## 2018-11-16 ENCOUNTER — Other Ambulatory Visit: Payer: Self-pay | Admitting: *Deleted

## 2018-11-16 NOTE — Patient Outreach (Signed)
[facility] [facility]) Care Management  11/16/2018   Charles Fox 05/08/1951 [id number]  RN Health Coach telephone call to patient.  Hipaa compliance verified. Per patient he has not checked his blood sugars today. Patient stated it was in the 150's yesterday. RN asked patient did he get the new meter. Patient stated no my old meter works and I am using it. Patient does not have a routine exercise program. Patient stated he walks around in the store when he goes out. RN discussed health care maintenance. RN asked patient had he set up an eye exam. Patient stated no because he wanted to get his teeth pulled 1st. Patient has gotten all his teeth pulled except 6.  Patient stated he is looking for cheaper living arrangements. Patient also stated that he needs another car. The car he has needs a lot of repair and tires. Patient has agreed to follow up outreach calls.   Current Medications:  Current Outpatient Medications  Medication Sig Dispense Refill  . Alcohol Swabs PADS He is to check blood glucose 2-3 times a day 100 each 11  . aspirin 81 MG tablet Take 1 tablet (81 mg total) by mouth daily. 90 tablet 3  . atorvastatin (LIPITOR) 40 MG tablet Take 1 tablet (40 mg total) by mouth daily. (Patient not taking: Reported on 08/24/2018) 90 tablet 3  . Blood Glucose Calibration (TRUE METRIX LEVEL 2) Normal SOLN Use to check your blood glucose twice a day 1 each 11  . blood glucose meter kit and supplies KIT Dispense based on patient and insurance preference. Use up to four times daily as directed. (FOR ICD-9 250.00, 250.01). 1 each 0  . Blood Glucose Monitoring Suppl (ONETOUCH VERIO) w/Device KIT Use to check blood sugars as directed. 1 kit 0  . Blood Glucose Monitoring Suppl (TRUE METRIX METER) w/Device KIT Use to check your blood glucose twice a day 1 kit 0  . empagliflozin (JARDIANCE) 10 MG TABS tablet Take 10 mg by mouth daily. 90 tablet 2  . glucose blood (TRUE METRIX BLOOD GLUCOSE TEST)  test strip Use to check your blood glucose twice a day 100 each 12  . Insulin Glargine (BASAGLAR KWIKPEN) 100 UNIT/ML SOPN Inject 30 Units into the skin daily.    . Insulin Pen Needle (UNIFINE PENTIPS) 31G X 5 MM MISC Use one to inject your insulin once daily 100 each 3  . Lancets (ONETOUCH ULTRASOFT) lancets Use to check blood glucose 2-3 times a day 100 each 3  . lisinopril (PRINIVIL,ZESTRIL) 20 MG tablet Take 1 tablet (20 mg total) by mouth daily. (Patient not taking: Reported on 10/18/2018) 90 tablet 3  . metFORMIN (GLUCOPHAGE-XR) 500 MG 24 hr tablet Take 1 tablet (500 mg total) by mouth daily with breakfast. 90 tablet 2   No current facility-administered medications for this visit.     Functional Status:  In your present state of health, do you have any difficulty performing the following activities: 08/13/2018 03/06/2018  Hearing? N N  Vision? N N  Difficulty concentrating or making decisions? N N  Walking or climbing stairs? Y Y  Dressing or bathing? N N  Doing errands, shopping? N N  Preparing Food and eating ? N N  Using the Toilet? N N  In the past six months, have you accidently leaked urine? N N  Do you have problems with loss of bowel control? N N  Managing your Medications? N N  Managing your Finances? N N  Housekeeping or  managing your Housekeeping? N N  Some recent data might be hidden    Fall/Depression Screening: Fall Risk  11/16/2018 10/11/2018 10/11/2018  Falls in the past year? 0 0 0   PHQ 2/9 Scores 11/16/2018 10/11/2018 07/09/2018 05/07/2018 04/20/2018 03/26/2018 03/06/2018  PHQ - 2 Score 0 0 0 1 0 0 0    Assessment:  Patient is not checking his blood sugars daily Patient has not received his new meter   Plan:  RN discussed health care maintenance RN discussed checking his blood sugars RN questioned patient about getting new meter the Dr ordered RN will follow up outreach within the month of May   [place]  Management [phone]

## 2019-01-31 ENCOUNTER — Other Ambulatory Visit: Payer: Self-pay | Admitting: *Deleted

## 2019-01-31 NOTE — Telephone Encounter (Signed)
Rx request from [facility]. Cant find in it computer. Please advise. Deseree Kennon Holter, CMA

## 2019-02-01 MED ORDER — BASAGLAR KWIKPEN 100 UNIT/ML ~~LOC~~ SOPN: 30.0000 [IU] | PEN_INJECTOR | Freq: Every day | SUBCUTANEOUS | 3 refills | Status: DC

## 2019-02-04 ENCOUNTER — Telehealth: Payer: Self-pay

## 2019-02-04 NOTE — Telephone Encounter (Signed)
Received fax from [place] requesting prior authorization of Charles Fox.  We can start a PA or send in Lantus as an alternative, per pharmacy.

## 2019-02-04 NOTE — Telephone Encounter (Signed)
Routing to [facility] because I think his meds should be covered under medication assistance program.

## 2019-02-06 ENCOUNTER — Telehealth: Payer: Self-pay | Admitting: Family Medicine

## 2019-02-06 NOTE — Telephone Encounter (Signed)
Thank you pharmacy team!

## 2019-02-06 NOTE — Telephone Encounter (Signed)
Opened in error

## 2019-02-14 ENCOUNTER — Other Ambulatory Visit: Payer: Self-pay | Admitting: *Deleted

## 2019-02-14 NOTE — Patient Outreach (Signed)
[facility]) Care Management  02/14/2019  Charles Fox 08/19/1951 [id number]   RN Health Coach attempted follow up outreach call to patient.  Patient was unavailable. HIPPA compliance voicemail message left with return callback number.  Plan: RN will call patient again within 30 days.  [place] Care Management [phone]

## 2019-02-21 ENCOUNTER — Telehealth: Payer: Self-pay | Admitting: Family Medicine

## 2019-02-21 DIAGNOSIS — E119 Type 2 diabetes mellitus without complications: Secondary | ICD-10-CM

## 2019-02-21 DIAGNOSIS — Z794 Long term (current) use of insulin: Secondary | ICD-10-CM

## 2019-02-21 MED ORDER — INSULIN PEN NEEDLE 31G X 5 MM MISC: 3 refills | Status: DC

## 2019-02-21 NOTE — Telephone Encounter (Signed)
Please let him know I sent them to the outpatient pharmacy, I will also route to his pharmacy team in case that is not correct.

## 2019-02-21 NOTE — Telephone Encounter (Signed)
Not sure where these go to since he gets his medications through the assistance program.

## 2019-02-21 NOTE — Telephone Encounter (Signed)
Patient needs the needles that go into the insulin pen.  Patient is out of these.  He does not know where to find these.

## 2019-03-18 ENCOUNTER — Other Ambulatory Visit: Payer: Self-pay | Admitting: *Deleted

## 2019-03-18 NOTE — Patient Outreach (Signed)
[place] [facility]) Care Management  03/18/2019  Charles Fox [birth year] [id number]  RN Health Coach attempted follow up outreach call to patient.  Patient was unavailable. Per voicemail response the person you are calling is not accepting calls at this time.  Plan: RN will call patient again within 30 days.  [place] Care Management [phone]

## 2019-04-11 ENCOUNTER — Emergency Department (HOSPITAL_COMMUNITY)
Admission: EM | Admit: 2019-04-11 | Discharge: 2019-04-11 | Disposition: A | Discharge status: Home / Self Care | Payer: Medicare Other | Attending: Emergency Medicine | Admitting: Emergency Medicine

## 2019-04-11 ENCOUNTER — Other Ambulatory Visit: Payer: Self-pay

## 2019-04-11 ENCOUNTER — Emergency Department (HOSPITAL_COMMUNITY): Payer: Medicare Other

## 2019-04-11 DIAGNOSIS — E119 Type 2 diabetes mellitus without complications: Secondary | ICD-10-CM | POA: Insufficient documentation

## 2019-04-11 DIAGNOSIS — Z794 Long term (current) use of insulin: Secondary | ICD-10-CM | POA: Insufficient documentation

## 2019-04-11 DIAGNOSIS — I10 Essential (primary) hypertension: Secondary | ICD-10-CM | POA: Diagnosis not present

## 2019-04-11 DIAGNOSIS — Z79899 Other long term (current) drug therapy: Secondary | ICD-10-CM | POA: Diagnosis not present

## 2019-04-11 DIAGNOSIS — G459 Transient cerebral ischemic attack, unspecified: Secondary | ICD-10-CM

## 2019-04-11 DIAGNOSIS — F17210 Nicotine dependence, cigarettes, uncomplicated: Secondary | ICD-10-CM | POA: Insufficient documentation

## 2019-04-11 DIAGNOSIS — Z7982 Long term (current) use of aspirin: Secondary | ICD-10-CM | POA: Diagnosis not present

## 2019-04-11 DIAGNOSIS — R42 Dizziness and giddiness: Secondary | ICD-10-CM | POA: Diagnosis present

## 2019-04-11 LAB — DIFFERENTIAL
Abs Immature Granulocytes: 0.03 10*3/uL (ref 0.00–0.07)
Basophils Absolute: 0.1 10*3/uL (ref 0.0–0.1)
Basophils Relative: 1 %
Eosinophils Absolute: 0.2 10*3/uL (ref 0.0–0.5)
Eosinophils Relative: 3 %
Immature Granulocytes: 0 %
Lymphocytes Relative: 16 %
Lymphs Abs: 1.2 10*3/uL (ref 0.7–4.0)
Monocytes Absolute: 0.6 10*3/uL (ref 0.1–1.0)
Monocytes Relative: 7 %
Neutro Abs: 5.8 10*3/uL (ref 1.7–7.7)
Neutrophils Relative %: 73 %

## 2019-04-11 LAB — URINALYSIS, ROUTINE W REFLEX MICROSCOPIC
Bacteria, UA: NONE SEEN
Bilirubin Urine: NEGATIVE
Glucose, UA: >=500 mg/dL — AB
Hgb urine dipstick: NEGATIVE
Ketones, ur: NEGATIVE mg/dL
Leukocytes,Ua: NEGATIVE
Nitrite: NEGATIVE
Protein, ur: 30 mg/dL — AB
Specific Gravity, Urine: 1.016 (ref 1.005–1.030)
pH: 7 (ref 5.0–8.0)

## 2019-04-11 LAB — COMPREHENSIVE METABOLIC PANEL
ALT: 17 U/L (ref 0–44)
AST: 20 U/L (ref 15–41)
Albumin: 3.4 g/dL — ABNORMAL LOW (ref 3.5–5.0)
Alkaline Phosphatase: 100 U/L (ref 38–126)
Anion gap: 7 (ref 5–15)
BUN: 8 mg/dL (ref 8–23)
CO2: 25 mmol/L (ref 22–32)
Calcium: 9 mg/dL (ref 8.9–10.3)
Chloride: 106 mmol/L (ref 98–111)
Creatinine, Ser: 0.82 mg/dL (ref 0.61–1.24)
GFR calc Af Amer: >60 mL/min (ref >60)
GFR calc non Af Amer: >60 mL/min (ref >60)
Glucose, Bld: 171 mg/dL — ABNORMAL HIGH (ref 70–99)
Potassium: 3.7 mmol/L (ref 3.5–5.1)
Sodium: 138 mmol/L (ref 135–145)
Total Bilirubin: 0.7 mg/dL (ref 0.3–1.2)
Total Protein: 6.6 g/dL (ref 6.5–8.1)

## 2019-04-11 LAB — CBC
HCT: 48.8 % (ref 39.0–52.0)
Hemoglobin: 16.1 g/dL (ref 13.0–17.0)
MCH: 30.1 pg (ref 26.0–34.0)
MCHC: 33 g/dL (ref 30.0–36.0)
MCV: 91.4 fL (ref 80.0–100.0)
Platelets: 267 10*3/uL (ref 150–400)
RBC: 5.34 MIL/uL (ref 4.22–5.81)
RDW: 14.2 % (ref 11.5–15.5)
WBC: 7.8 10*3/uL (ref 4.0–10.5)
nRBC: 0 % (ref 0.0–0.2)

## 2019-04-11 LAB — ETHANOL: Alcohol, Ethyl (B): <10 mg/dL (ref <10)

## 2019-04-11 LAB — PROTIME-INR
INR: 1 (ref 0.8–1.2)
Prothrombin Time: 13.5 seconds (ref 11.4–15.2)

## 2019-04-11 LAB — APTT: aPTT: 32 seconds (ref 24–36)

## 2019-04-11 MED ORDER — MECLIZINE HCL 25 MG PO TABS: 25.0000 mg | ORAL_TABLET | Freq: Three times a day (TID) | ORAL | 0 refills | Status: DC | PRN

## 2019-04-11 NOTE — ED Provider Notes (Addendum)
[facility] EMERGENCY DEPARTMENT Provider Note   CSN: [id number] Arrival date & time: 04/11/19  1432    History   Chief Complaint Chief Complaint  Patient presents with  . Dizziness    HPI Charles Fox is a 68 y.o. male.     HPI  68 year old male with history of hypertension, diabetes, obesity comes in a chief complaint of dizziness.  Patient reports that he was watching TV when suddenly he started getting dizzy.  His episode lasted for 10 minutes and is described a spinning sensation and lightheadedness.  Patient did not have any associated vision changes, numbness, tingling, focal weakness.  Over time he got concerned about his symptoms and decided to call 911.  Patient had another episode few minutes ago.  Both of the episodes lasted for about 10 to 15 minutes.  Patient denies any history of CAD, stroke.  He denies any positional component to his symptoms and there is no specific evoking factor that he could appreciate.  Past Medical History:  Diagnosis Date  . Diabetes mellitus without complication ([place])   . Hypertension     Patient Active Problem List   Diagnosis Date Noted  . Food insecurity 07/09/2018  . Skin lesions, generalized 05/09/2018  . Routine adult health maintenance 03/27/2017  . Hypercholesterolemia 12/22/2016  . Urine test positive for microalbuminuria 05/27/2016  . Diabetes mellitus ([place]) 09/04/2014  . Morbid obesity ([place]) 09/04/2014  . HTN 09/04/2014  . Tobacco use disorder 09/04/2014    Past Surgical History:  Procedure Laterality Date  . BACK SURGERY    . FINGER SURGERY          Home Medications    Prior to Admission medications   Medication Sig Start Date End Date Taking? Authorizing Provider  aspirin 81 MG tablet Take 1 tablet (81 mg total) by mouth daily. 05/09/18  Yes Sela Hilding, MD  atorvastatin (LIPITOR) 40 MG tablet Take 1 tablet (40 mg total) by mouth daily. 07/02/18  Yes Sela Hilding, MD   Insulin Glargine (BASAGLAR KWIKPEN) 100 UNIT/ML SOPN Inject 0.3 mLs (30 Units total) into the skin daily. 02/01/19  Yes Sela Hilding, MD  metFORMIN (GLUCOPHAGE-XR) 500 MG 24 hr tablet Take 1 tablet (500 mg total) by mouth daily with breakfast. Patient taking differently: Take 250 mg by mouth daily with breakfast.  07/02/18  Yes Sela Hilding, MD  Alcohol Swabs PADS He is to check blood glucose 2-3 times a day 12/04/17   Mercy Riding, MD  Blood Glucose Calibration (TRUE METRIX LEVEL 2) Normal SOLN Use to check your blood glucose twice a day 12/20/17   Mercy Riding, MD  blood glucose meter kit and supplies KIT Dispense based on patient and insurance preference. Use up to four times daily as directed. (FOR ICD-9 250.00, 250.01). 04/20/18   Sherene Sires, DO  Blood Glucose Monitoring Suppl (ONETOUCH VERIO) w/Device KIT Use to check blood sugars as directed. 10/18/18   Sela Hilding, MD  Blood Glucose Monitoring Suppl (TRUE METRIX METER) w/Device KIT Use to check your blood glucose twice a day 12/20/17   Mercy Riding, MD  glucose blood (TRUE METRIX BLOOD GLUCOSE TEST) test strip Use to check your blood glucose twice a day 12/20/17   Mercy Riding, MD  Insulin Pen Needle (UNIFINE PENTIPS) 31G X 5 MM MISC Use one to inject your insulin once daily 02/21/19   Sela Hilding, MD  Lancets [facility] ULTRASOFT) lancets Use to check blood glucose 2-3 times a  day 12/04/17   Mercy Riding, MD    Family History No family history on file.  Social History Social History   Tobacco Use  . Smoking status: Current Every Day Smoker    Packs/day: 0.50    Types: Cigarettes  . Smokeless tobacco: Never Used  Substance Use Topics  . Alcohol use: No  . Drug use: Not on file     Allergies   Patient has no known allergies.   Review of Systems Review of Systems  Constitutional: Positive for activity change.  Eyes: Negative for visual disturbance.  Respiratory: Negative for shortness of breath.    Cardiovascular: Negative for chest pain.  Gastrointestinal: Negative for nausea and vomiting.  Neurological: Positive for dizziness.  All other systems reviewed and are negative.    Physical Exam Updated Vital Signs BP (!) 149/82   Pulse 72   Temp (!) 97.4 F (36.3 C) (Oral)   Resp 19   Ht _0  (1.727 m)   Wt 108.9 kg   SpO2 100%   BMI 36.49 kg/m   Physical Exam Vitals signs and nursing note reviewed.  Constitutional:      Appearance: He is well-developed.  HENT:     Head: Atraumatic.  Eyes:     Extraocular Movements: Extraocular movements intact.     Pupils: Pupils are equal, round, and reactive to light.  Neck:     Musculoskeletal: Neck supple.  Cardiovascular:     Rate and Rhythm: Normal rate.  Pulmonary:     Effort: Pulmonary effort is normal.  Skin:    General: Skin is warm.  Neurological:     General: No focal deficit present.     Mental Status: He is alert and oriented to person, place, and time.     Cranial Nerves: No cranial nerve deficit.     Sensory: No sensory deficit.      ED Treatments / Results  Labs (all labs ordered are listed, but only abnormal results are displayed) Labs Reviewed  PROTIME-INR  APTT  CBC  DIFFERENTIAL  ETHANOL  COMPREHENSIVE METABOLIC PANEL  URINALYSIS, ROUTINE W REFLEX MICROSCOPIC    EKG EKG Interpretation  Date/Time:  Thursday April 11 2019 15:15:17 EDT Ventricular Rate:  74 PR Interval:    QRS Duration: 95 QT Interval:  396 QTC Calculation: 440 R Axis:   -45 Text Interpretation:  Sinus rhythm Left anterior fascicular block No acute changes No significant change since last tracing Confirmed by Varney Biles [phone]) on 04/11/2019 3:55:38 PM   Radiology No results found.  Procedures Procedures (including critical care time)  Medications Ordered in ED Medications - No data to display   Initial Impression / Assessment and Plan / ED Course  I have reviewed the triage vital signs and the nursing notes.   Pertinent labs & imaging results that were available during my care of the patient were reviewed by me and considered in my medical decision making (see chart for details).        68 year old male comes in a chief complaint of dizziness.  He has history of hypertension and diabetes.  He has had about 2 into 10-minute episode of dizziness that were unprovoked.  Differential diagnosis includes TIA, small ischemic stroke, orthostatic dizziness. Clinically does not appear to be BPPV.  Plan is to get MRI of the brain given the risk factors and a high ABCD score 4. Patient wants to go home.  He has agreed to stay for MRI, but if negative he  would prefer going home.  Patient's care will be signed out to the incoming staff.  Labs and the MRI results are pending.  Final Clinical Impressions(s) / ED Diagnoses   Final diagnoses:  None    ED Discharge Orders    None       Varney Biles, MD 04/11/19 [id number]    Varney Biles, MD 04/11/19 1556

## 2019-04-11 NOTE — ED Notes (Signed)
Cab voucher obtained from social work.

## 2019-04-11 NOTE — Discharge Instructions (Addendum)
1.  Make an appointment for recheck with your doctor as soon as possible. 2.  You may try meclizine (Antivert) for dizziness as prescribed.  This medicine helps with the symptoms.  If you feel better you may use it.  If you do not notice any improvement then discontinue this medication. 3.  Return to the emergency department if you have worsening, changing or concerning symptoms.

## 2019-04-11 NOTE — ED Triage Notes (Signed)
Pt BIB [facility] for dizziness x3 hours. Pt was lying down, then sat up and became dizzy. Pt reports that it felt like "vertigo" but does not have a history of vertigo. Reports the room was initially spinning but now the dizziness is better than it was. Pt is alert and oriented x4. HTN for EMS at 190/100. Pt does not regularly follow up with a PCP for his HTN per EMS.

## 2019-04-11 NOTE — ED Notes (Signed)
Pt reports the dizziness started at 1100 this morning.

## 2019-04-11 NOTE — ED Notes (Signed)
Pt reporting he does not have way home. Does not have family who can come pick him up and does not live within distance of a bus stop.

## 2019-04-11 NOTE — ED Provider Notes (Signed)
TIA symptoms, getting MRI, if negative anticipate D/C. Physical Exam  BP (!) 149/82   Pulse 72   Temp (!) 97.4 F (36.3 C) (Oral)   Resp 19   Ht 5\' 8"  (1.727 m)   Wt 108.9 kg   SpO2 100%   BMI 36.49 kg/m   Physical Exam  ED Course/Procedures     Procedures  MDM  Patient is alert and appropriate.  Mental status clear.  Sitting up in the stretcher in no distress.  Stable for discharge.  I have reviewed MRI findings.  I have reviewed use of meclizine on as needed basis.  Patient made aware that this is for symptomatic treatment and if he has no improvement he may discontinue the medication.  He is counseled to follow-up with his PCP ASAP.       Charlesetta Shanks, MD 04/11/19 [phone]

## 2019-04-12 MED FILL — MECLIZINE 25 MG TABLET: 25 | 10 days supply | Qty: 30 | Fill #0

## 2019-04-16 ENCOUNTER — Other Ambulatory Visit: Payer: Self-pay | Admitting: *Deleted

## 2019-04-16 NOTE — Patient Outreach (Signed)
[facility]) Care Management  04/16/2019   Charles Fox 03/25/1951 [id number] RN Health Coach telephone call to patient.  [employer] compliance verified. Per patient he is not feeling so well. Patient stated that he went to the ER via ambulance because he had been feeling dizzy. Patient has not had any recent falls.  RN asked patient had he checked his blood sugar and blood pressure. Patient stated no he did not that the hospital did. Patient stated that his appetite is poor. Per patient when he stands up and moves around he feels real dizzy. Patient stated that he is running out of food and that he doesn't feel like going out. Per patient the is going to call out of work for tomorrow. Patient has agreed to further outreach calls,  Current Medications:  Current Outpatient Medications  Medication Sig Dispense Refill  . Alcohol Swabs PADS He is to check blood glucose 2-3 times a day 100 each 11  . aspirin 81 MG tablet Take 1 tablet (81 mg total) by mouth daily. 90 tablet 3  . atorvastatin (LIPITOR) 40 MG tablet Take 1 tablet (40 mg total) by mouth daily. 90 tablet 3  . Blood Glucose Calibration (TRUE METRIX LEVEL 2) Normal SOLN Use to check your blood glucose twice a day 1 each 11  . blood glucose meter kit and supplies KIT Dispense based on patient and insurance preference. Use up to four times daily as directed. (FOR ICD-9 250.00, 250.01). 1 each 0  . Blood Glucose Monitoring Suppl (ONETOUCH VERIO) w/Device KIT Use to check blood sugars as directed. 1 kit 0  . Blood Glucose Monitoring Suppl (TRUE METRIX METER) w/Device KIT Use to check your blood glucose twice a day 1 kit 0  . glucose blood (TRUE METRIX BLOOD GLUCOSE TEST) test strip Use to check your blood glucose twice a day 100 each 12  . Insulin Glargine (BASAGLAR KWIKPEN) 100 UNIT/ML SOPN Inject 0.3 mLs (30 Units total) into the skin daily. 2 pen 3  . Insulin Pen Needle (UNIFINE PENTIPS) 31G X 5 MM MISC Use one to inject  your insulin once daily 100 each 3  . Lancets (ONETOUCH ULTRASOFT) lancets Use to check blood glucose 2-3 times a day 100 each 3  . meclizine (ANTIVERT) 25 MG tablet Take 1 tablet (25 mg total) by mouth 3 (three) times daily as needed for dizziness. 30 tablet 0  . metFORMIN (GLUCOPHAGE-XR) 500 MG 24 hr tablet Take 1 tablet (500 mg total) by mouth daily with breakfast. (Patient taking differently: Take 250 mg by mouth daily with breakfast. ) 90 tablet 2   No current facility-administered medications for this visit.     Functional Status:  In your present state of health, do you have any difficulty performing the following activities: 04/16/2019 08/13/2018  Hearing? N N  Vision? N N  Difficulty concentrating or making decisions? N N  Walking or climbing stairs? Y Y  Dressing or bathing? N N  Doing errands, shopping? N N  Preparing Food and eating ? N N  Using the Toilet? N N  In the past six months, have you accidently leaked urine? N N  Do you have problems with loss of bowel control? N N  Managing your Medications? N N  Managing your Finances? N N  Housekeeping or managing your Housekeeping? N N  Some recent data might be hidden    Fall/Depression Screening: Fall Risk  04/16/2019 11/16/2018 10/11/2018  Falls in the past year? 0  0 0  Risk for fall due to : Impaired balance/gait - -   PHQ 2/9 Scores 04/16/2019 11/16/2018 10/11/2018 07/09/2018 05/07/2018 04/20/2018 03/26/2018  PHQ - 2 Score 0 0 0 0 1 0 0   THN CM Care Plan Problem One     Most Recent Value  Care Plan Problem One  Knowledge Deficit in Self Management of Diabetes  Role Documenting the Problem One  [place] for Problem One  Active  THN Long Term Goal   Patient will report a decrease in A1C from 7.0 within the next 90 days  THN Long Term Goal Start Date  04/16/19  Interventions for Problem One Long Term Goal  RN reiterated the importance of checking blood sugars. The patient has a meter but is not using it. RN  will follow up further encouragement to make patient understand the importance of checking his blood sugars  THN CM Short Term Goal #2   Patient report checking and document blood sugars daily within the next 30 days  THN CM Short Term Goal #2 Start Date  04/16/19  Interventions for Short Term Goal #2  Patient doesn't like to check his blood sugar. He repeats that he will but never follows through on next outreach. RN will continue to reiterate  [facility] CM Short Term Goal #3  Patient will report better eating habits within the next 30 days  THN CM Short Term Goal #3 Start Date  04/16/19  Interventions for Short Tern Goal #3  RN discussed with patient about eating regular meals and healthy eating. RN will continue to reiterate the need      Assessment:  Patient is still having vertigo episodes No recent falls Patient not checking blood sugars Patient not checking blood pressure Per patient he has lost some weight  Plan:  Patient will notify PCP about the vertigo episodes continuing RN sent patient a [facility] catalog RN referred to social worker RN reiterated the need to check his blood sugar RN reiterated medication adherence Rn discussed healthy eating and eating regular meals RN will follow up within the month of September  Charles Fox [place] [facility] [phone]

## 2019-04-17 ENCOUNTER — Other Ambulatory Visit: Payer: Self-pay | Admitting: *Deleted

## 2019-04-17 ENCOUNTER — Encounter: Payer: Self-pay | Admitting: *Deleted

## 2019-04-17 NOTE — Patient Outreach (Signed)
[facility]) Care Management  04/17/2019  JAMONT MELLIN [birth year] [id number]  CSW was able to make initial contact with patient today to perform phone assessment, as well as assess and assist with social work needs and services.  CSW introduced self, explained role and types of services provided through [facility] ([facility]).  CSW further explained to patient that CSW works with patient's [place], also with [facility], [name]. CSW then explained the reason for the call, indicating that Mrs. Pleasant thought that patient would benefit from social work services and resources to assist with food insecurities.  CSW obtained two HIPAA compliant identifiers from patient, which included patient's name and date of birth.  Patient admitted that he is having an extremely difficult time purchasing food right now because he has been unable to pick up "odd jobs" to earn extra money, due to COVID-19 restrictions.  Patient further reported that his funds are limited each month with only receiving Social Security Disability.  Patient indicated that he is willing to collect food from food banks and food pantries, and that he often goes to various churches to receive a hot meal.  CSW agreed to mail patient [employer] [facility] in [place], as well as [employer] [facility] in [place].  CSW also agreed to assist patient with completion of a Food Stamps application, through the [place]; however, patient reported that he has already applied, but was over-qualified, based on income guidelines.  CSW was able to complete the referral form for [facility] for patient today, faxing the form to [email or web address], and cc'ing Norine Tamborino, Mudlogger with [employer], for  processing.  CSW has requested that patient receive 7 prepared meals each week, for a total of 12 weeks.  Patient has been approved and will receive 14 meals, every other week, beginning on Friday, April 19, 2019.  These meals will be diabetic appropriate with low sodium.  Patient was most appreciative of the assistance, denying any additional social work needs and services at present.  CSW will follow-up with patient next week, on Wednesday, April 24, 2019, around 3:30PM, to ensure that his two week supply of meals have been delivered, in addition to confirming that patient received the resource information that CSW mailed to his home today.  Nat Christen, BSW, MSW, LCSW  Licensed Education officer, environmental Health System  Mailing [place] [address]. [address], [place], [place] [address] Physical Address-[address], [place], [place] [address] Toll Free Main # [phone] Fax # [phone] Cell # [phone]  Office # [phone] [email or web address]

## 2019-04-24 ENCOUNTER — Other Ambulatory Visit: Payer: Self-pay | Admitting: *Deleted

## 2019-04-24 ENCOUNTER — Encounter: Payer: Self-pay | Admitting: *Deleted

## 2019-04-24 NOTE — Patient Outreach (Signed)
[facility]) Care Management  04/24/2019  DAIL LEREW 10/11/1950 [id number]   CSW was able to make contact with patient today to ensure that his 14 day supply of meals were delivered to his home by [employer] and that patient received the packet of resource information mailed to his home by CSW on Wednesday, April 17, 2019.  Patient confirmed that he received his first set of meals on Friday, April 19, 2019 and that he received his packet of resource information on Monday, April 22, 2019.  Patient admitted that the [facility] in [place], as well as [facility] in [place], "will definitely come in handy".  CSW will perform a case closure on patient, as all goals of treatment have been met from social work standpoint and no additional social work needs have been identified at this time.  CSW will notify patient's [place] with [facility], [name] of CSW's plans to close patient's case.  CSW will fax an update to patient's Primary Care Physician, Dr. Addison Naegeli to ensure that they are aware of CSW's involvement with patient's plan of care.  CSW was able to confirm that patient has the correct contact information for CSW, encouraging patient to contact CSW if additional social work needs arise in the near future.  Nat Christen, BSW, MSW, LCSW  Licensed Education officer, environmental Health System  Mailing [place] [address] [place], [place] [address] Physical Address-[address], [place], [place] [address] Toll Free Main # [phone] Fax # [phone] Cell # [phone]  Office # [phone] [email or web address]

## 2019-04-29 ENCOUNTER — Ambulatory Visit (INDEPENDENT_AMBULATORY_CARE_PROVIDER_SITE_OTHER): Payer: Medicare Other | Admitting: Family Medicine

## 2019-04-29 ENCOUNTER — Other Ambulatory Visit: Payer: Self-pay

## 2019-04-29 ENCOUNTER — Encounter: Payer: Self-pay | Admitting: Family Medicine

## 2019-04-29 VITALS — BP 162/80 | HR 80

## 2019-04-29 DIAGNOSIS — E119 Type 2 diabetes mellitus without complications: Secondary | ICD-10-CM | POA: Diagnosis not present

## 2019-04-29 DIAGNOSIS — I10 Essential (primary) hypertension: Secondary | ICD-10-CM | POA: Diagnosis not present

## 2019-04-29 DIAGNOSIS — M67912 Unspecified disorder of synovium and tendon, left shoulder: Secondary | ICD-10-CM | POA: Insufficient documentation

## 2019-04-29 DIAGNOSIS — Z794 Long term (current) use of insulin: Secondary | ICD-10-CM | POA: Diagnosis not present

## 2019-04-29 HISTORY — DX: Unspecified disorder of synovium and tendon, left shoulder: M67.912

## 2019-04-29 LAB — POCT GLYCOSYLATED HEMOGLOBIN (HGB A1C): HbA1c, POC (controlled diabetic range): 6.9 % (ref 0.0–7.0)

## 2019-04-29 MED ORDER — MELOXICAM 7.5 MG PO TABS: 7.5000 mg | ORAL_TABLET | Freq: Every day | ORAL | 0 refills | Status: DC

## 2019-04-29 MED ORDER — LOSARTAN POTASSIUM 25 MG PO TABS: 25.0000 mg | ORAL_TABLET | Freq: Every day | ORAL | 0 refills | Status: DC

## 2019-04-29 MED ORDER — BASAGLAR KWIKPEN 100 UNIT/ML ~~LOC~~ SOPN: 30.0000 [IU] | PEN_INJECTOR | Freq: Every day | SUBCUTANEOUS | 3 refills | Status: DC

## 2019-04-29 NOTE — Progress Notes (Signed)
Charles Fox Phone: [phone]     Charles Fox - 68 y.o. male MRN [id number]  Date of birth: 08-Dec-1950  Subjective:   cc: Left shoulder pain  HPI:  Patient is having pain in the left arm going from the shoulder to the elbow.  This is gone on for about 1 month.  He has not had any arm weakness, no paresthesia, no numbness.  Patient does not work at a job that requires a lot of overhead lifting or repetitive movements, or she otherwise stress on that joint.  Patient did not injure it prior to this pain.  He describes the pain as a ache.  He has been using ice and baby aspirin.  Diabetes: Patient states he needs a refill on his insulin.  States he has been taking it daily.  Reports no issues or side effects.  Patient states he has been trying to diet.  He also admits to exercising by walking around his neighborhood.  Hypertension: Patient states he is not taking any blood pressure medicines.  Was not aware that he had been prescribed any.  ROS: See HPI for pertinent positives and negatives  Past Medical History  Family history reviewed for today's visit. No changes.   Health Maintenance Due  Topic Date Due  . OPHTHALMOLOGY EXAM  08/07/1963  . COLONOSCOPY  08/07/2003  . URINE MICROALBUMIN  05/26/2017  . PNA vac Low Risk Adult (1 of 2 - PCV13) 08/06/2018  . HEMOGLOBIN A1C  04/11/2019  . INFLUENZA VACCINE  04/20/2019    Objective:   BP (!) 162/80   Pulse 80   SpO2 98%  Gen: NAD, alert and oriented, cooperative with exam CV: normal rate, regular rhythm. No murmurs, no rubs.  Resp: LCTAB, no wheezes, crackles. normal work of breathing Msk: No tenderness to palpation in the arms bilaterally.  Pain with passive range of motion of the left arm.  Positive empty beer can test on left side.  Negative pushoff test left side. Psych: Appropriate behavior  Assessment/Plan:   HTN Looks like patient was previously prescribed lisinopril, although he does not  admit to taking it.  Blood pressure was 160/80 followed by 162/80 on other arm on repeat.  Prescribed patient 25 mg of losartan daily.  No previous history of kidney disease.  Patient has diabetes.  We will follow-up in 1 month.  Tendinopathy of left rotator cuff 1 month of chronic aching left shoulder and upper arm pain.  No inciting incident.  No history of repetitive use or overuse.  Empty can test, Neer test positive. - 2 weeks prescription of meloxicam - Ambulatory referral to physical therapy - Tylenol daily - Ice when resting. -Follow-up in 4 weeks.  If no improvement consider injection of shoulder joint  Diabetes mellitus Patient states he is trying to watch his diet and exercising.  Today he weighs 249 pounds.  A1c is 6.9.  Patient states he needs refill on his insulin. -Refill insulin. -Follow-up in 1 month.   Orders Placed This Encounter  Procedures  . Ambulatory referral to Physical Therapy    Referral Priority:   Routine    Referral Type:   Physical Medicine    Referral Reason:   Specialty Services Required    Requested Specialty:   Physical Therapy    Number of Visits Requested:   1  . HgB A1c    Meds ordered this encounter  Medications  . losartan (COZAAR) 25 MG tablet  Sig: Take 1 tablet (25 mg total) by mouth at bedtime.    Dispense:  30 tablet    Refill:  0  . DISCONTD: meloxicam (MOBIC) 7.5 MG tablet    Sig: Take 1 tablet (7.5 mg total) by mouth daily.    Dispense:  30 tablet    Refill:  0  . meloxicam (MOBIC) 7.5 MG tablet    Sig: Take 1 tablet (7.5 mg total) by mouth daily.    Dispense:  15 tablet    Refill:  0  . Insulin Glargine (BASAGLAR KWIKPEN) 100 UNIT/ML SOPN    Sig: Inject 0.3 mLs (30 Units total) into the skin daily.    Dispense:  2 pen    Refill:  3     Clemetine Marker, MD PGY-2 [facility]

## 2019-04-29 NOTE — Assessment & Plan Note (Signed)
Looks like patient was previously prescribed lisinopril, although he does not admit to taking it.  Blood pressure was 160/80 followed by 162/80 on other arm on repeat.  Prescribed patient 25 mg of losartan daily.  No previous history of kidney disease.  Patient has diabetes.  We will follow-up in 1 month.

## 2019-04-29 NOTE — Assessment & Plan Note (Signed)
1 month of chronic aching left shoulder and upper arm pain.  No inciting incident.  No history of repetitive use or overuse.  Empty can test, Neer test positive. - 2 weeks prescription of meloxicam - Ambulatory referral to physical therapy - Tylenol daily - Ice when resting. -Follow-up in 4 weeks.  If no improvement consider injection of shoulder joint

## 2019-04-29 NOTE — Assessment & Plan Note (Signed)
Patient states he is trying to watch his diet and exercising.  Today he weighs 249 pounds.  A1c is 6.9.  Patient states he needs refill on his insulin. -Refill insulin. -Follow-up in 1 month.

## 2019-04-29 NOTE — Patient Instructions (Addendum)
It was nice to see you today,  You likely have rotator cuff tendinopathy.  Treatment for this is rest, ice, pain relief.  For pain relief you can take Tylenol every day.  I would also suggest taking a NSAID called meloxicam daily for the next 2 weeks.  If you still do not have pain relief in 4 weeks from now you can schedule follow-up appointment and we can discuss other treatment options.  For your blood pressure I have started you on a medication called losartan.  You take this once a day at bedtime.  Please come back in 1 month for a follow-up for your blood pressure and shoulder.  Have a great day,  Clemetine Marker, MD

## 2019-05-30 ENCOUNTER — Other Ambulatory Visit: Payer: Self-pay

## 2019-05-30 ENCOUNTER — Ambulatory Visit (INDEPENDENT_AMBULATORY_CARE_PROVIDER_SITE_OTHER): Payer: Medicare Other | Admitting: Family Medicine

## 2019-05-30 ENCOUNTER — Encounter: Payer: Self-pay | Admitting: Family Medicine

## 2019-05-30 VITALS — BP 152/72 | HR 76 | Wt 246.2 lb

## 2019-05-30 DIAGNOSIS — Z5941 Food insecurity: Secondary | ICD-10-CM

## 2019-05-30 DIAGNOSIS — M67912 Unspecified disorder of synovium and tendon, left shoulder: Secondary | ICD-10-CM | POA: Diagnosis not present

## 2019-05-30 DIAGNOSIS — Z794 Long term (current) use of insulin: Secondary | ICD-10-CM

## 2019-05-30 DIAGNOSIS — E119 Type 2 diabetes mellitus without complications: Secondary | ICD-10-CM

## 2019-05-30 DIAGNOSIS — I10 Essential (primary) hypertension: Secondary | ICD-10-CM | POA: Diagnosis not present

## 2019-05-30 DIAGNOSIS — Z594 Lack of adequate food and safe drinking water: Secondary | ICD-10-CM

## 2019-05-30 LAB — POCT UA - MICROALBUMIN
Albumin/Creatinine Ratio, Urine, POC: >300
Creatinine, POC: 50 mg/dL
Microalbumin Ur, POC: 80 mg/L

## 2019-05-30 MED ORDER — UNIFINE PENTIPS 31G X 5 MM MISC: 3 refills | Status: DC

## 2019-05-30 MED ORDER — LOSARTAN POTASSIUM 50 MG PO TABS: 50.0000 mg | ORAL_TABLET | Freq: Every day | ORAL | 0 refills | Status: DC

## 2019-05-30 MED FILL — LOSARTAN POTASSIUM 50 MG TA: 50 | 30 days supply | Qty: 30 | Fill #0

## 2019-05-30 MED FILL — UNIFINE PENTIPS 31GX3/16: 31G X 5 MM | 90 days supply | Qty: 100 | Fill #0

## 2019-05-30 NOTE — Patient Instructions (Addendum)
I have increased your losartan to 50 mg.  In 1 week I would like you to come back 3 can get a blood test, but you do not you make an appointment with me.  You can show up at any time next week.  If your shoulder bothers you worse than it is currently, you can always use Tylenol in addition to the aspirin you are taking.  Also, your meloxicam prescription should still be waiting for you at your pharmacy if you need it.  I would like to see you back in 3 months to check up on your diabetes, and blood pressure.  Have a great day,  Clemetine Marker, MD

## 2019-05-30 NOTE — Progress Notes (Signed)
   [facility] Phone: [phone]     Charles Fox - 68 y.o. male MRN [id number]  Date of birth: Dec 18, 1950  Subjective:   cc: shoulder pain  HPI:  Arm pain: pain is about the same since the last time we talked.  It is feeling a little worse today because the weather outside. He doesn't hink he needs to get physical therapy.  Just taking aspirin.  No meloxicam.  Not taking tylenol.      DM: Needs insulin pen. Hasn't taken insulin yet today.  Checks his blood glucose in the mornings, before eating but he Doesn't remember the last time he checked his blood sugar.  Drinks diet sodas, but now prefers water.   HTN: taking losartan 25mg  nightly.  Tolerating well.   Insomnia: Has trouble getting back to sleep after waking up. sleeps 10pm to 130pm every night.  If he goes back to sleep he will end up oversleeping, so he stays awake.    Food insecurity: gets food sent to him through [place].  Afraid of going to food pantry d/t covid.      ROS: See HPI for pertinent positives and negatives  Family history reviewed for today's visit. No changes.  -  reports that he has been smoking cigarettes. He has been smoking about 0.50 packs per day. He has never used smokeless tobacco.  Objective:   BP (!) 152/72   Pulse 76   Wt 246 lb 3.2 oz (111.7 kg)   SpO2 99%   BMI 37.43 kg/m  Gen: NAD, alert and oriented, cooperative with exam CV: normal rate, regular rhythm. No murmurs, no rubs.  Resp: LCTAB, no wheezes, crackles. normal work of breathing Psych: Appropriate behavior  Assessment/Plan:   Diabetes mellitus Well controlled, per patient.  - refill insulin needles - bmp   HTN Taking medications, no Side effects - increase losartan to 50mg  daily - BMP  Tendinopathy of left rotator cuff Patient says pain tolerable.  - not taking meloxiocam - does not want PT - taking tylenol 81mg  daily only   Food insecurity Patient has [facility] with [facility].  Is  receiving meals via [employer].  States he is not having issues with access to food currently.    Clemetine Marker, MD PGY-2 [facility]

## 2019-06-02 NOTE — Assessment & Plan Note (Signed)
Patient says pain tolerable.  - not taking meloxiocam - does not want PT - taking tylenol 81mg  daily only

## 2019-06-02 NOTE — Assessment & Plan Note (Signed)
Well controlled, per patient.  - refill insulin needles - bmp

## 2019-06-02 NOTE — Assessment & Plan Note (Signed)
Taking medications, no Side effects - increase losartan to 50mg  daily - BMP

## 2019-06-02 NOTE — Assessment & Plan Note (Signed)
Patient has spoken with [facility].  Is receiving meals via [employer].  States he is not having issues with access to food currently.

## 2019-06-04 ENCOUNTER — Other Ambulatory Visit: Payer: Medicare Other

## 2019-06-17 ENCOUNTER — Other Ambulatory Visit: Payer: Self-pay | Admitting: *Deleted

## 2019-06-18 ENCOUNTER — Other Ambulatory Visit: Payer: Self-pay

## 2019-06-18 NOTE — Patient Outreach (Signed)
[facility]) Care Management  06/18/2019  Charles Fox Feb 14, 1951 [id number]   In-basket message received to contact patient regarding application for food stamps as well as him needing a new Medicare card.  Successful outreach to patient today.  Per note from [place], entered on 04/17/19 patient previously applied for food stamps but did not qualify due to income.  Inquired about this today but patient stated that he never applied.  Agreed to mail application. Patient reported needing medicare card due to current one being old.  Asked if current card has customer service number on the back and patient confirmed that it does.  Patient encouraged to call to request new card. During call, patient inquired about assistance with finding a more affordable place to live.  Agreed to send housing resources.   The following is being mailed: Application for Food and Nutrition Services. [email or web address]  Will follow up within the next two weeks to ensure receipt of resources.  Ronn Melena, BSW Social Worker [phone]

## 2019-06-19 NOTE — Patient Outreach (Addendum)
[facility]) Care Management  06/17/2019  Charles Fox [birth year] [id number]  Subjective: RN Health Coach telephone call to patient.  Hipaa compliance verified. Patient A1C is 6.9 down from 7.0. Per patient he has not checked his blood sugar.  Patient has stated  he hates to stick his finger. Patient stated he is getting the meals sent to him. Patient states he does go out some , but he is afraid to go and do grocery shopping due to [place]. Patent stated he has not been to any of the food banks even though the social worker has given him a list and now because of [place]. Patient was given the list before COVID and refused to go. Patient stated he does not have a medicare card. Patient has not requested one. Patient talks about moving to cheaper housing and was given a list in the past. Patient states that he does not have any support system and refused to give names. Per patient him and his son are estranged and do not talk. Patient stated him and his uncle do not talk. Patient stated he does not have any friends that could be a caregiver for him.Patient has agreed to further outreach calls.    Current Medications:  Current Outpatient Medications  Medication Sig Dispense Refill  . Alcohol Swabs PADS He is to check blood glucose 2-3 times a day 100 each 11  . aspirin 81 MG tablet Take 1 tablet (81 mg total) by mouth daily. 90 tablet 3  . atorvastatin (LIPITOR) 40 MG tablet Take 1 tablet (40 mg total) by mouth daily. 90 tablet 3  . Blood Glucose Calibration (TRUE METRIX LEVEL 2) Normal SOLN Use to check your blood glucose twice a day 1 each 11  . blood glucose meter kit and supplies KIT Dispense based on patient and insurance preference. Use up to four times daily as directed. (FOR ICD-9 250.00, 250.01). 1 each 0  . Blood Glucose Monitoring Suppl (ONETOUCH VERIO) w/Device KIT Use to check blood sugars as directed. 1 kit 0  . Blood Glucose Monitoring Suppl (TRUE METRIX METER)  w/Device KIT Use to check your blood glucose twice a day 1 kit 0  . glucose blood (TRUE METRIX BLOOD GLUCOSE TEST) test strip Use to check your blood glucose twice a day 100 each 12  . Insulin Glargine (BASAGLAR KWIKPEN) 100 UNIT/ML SOPN Inject 0.3 mLs (30 Units total) into the skin daily. 2 pen 3  . Insulin Pen Needle (UNIFINE PENTIPS) 31G X 5 MM MISC Use one to inject your insulin once daily 100 each 3  . Lancets (ONETOUCH ULTRASOFT) lancets Use to check blood glucose 2-3 times a day 100 each 3  . losartan (COZAAR) 50 MG tablet Take 1 tablet (50 mg total) by mouth at bedtime. 30 tablet 0  . metFORMIN (GLUCOPHAGE-XR) 500 MG 24 hr tablet Take 1 tablet (500 mg total) by mouth daily with breakfast. (Patient taking differently: Take 250 mg by mouth daily with breakfast. ) 90 tablet 2   No current facility-administered medications for this visit.     Functional Status:  In your present state of health, do you have any difficulty performing the following activities: 06/17/2019 04/17/2019  Hearing? N N  Vision? N N  Difficulty concentrating or making decisions? N N  Walking or climbing stairs? N Y  Comment - Morbid Obesity and Dizziness.  Dressing or bathing? N N  Doing errands, shopping? N N  Preparing Food and eating ? N N  Using the Toilet? N N  In the past six months, have you accidently leaked urine? N N  Do you have problems with loss of bowel control? N N  Managing your Medications? N N  Managing your Finances? N N  Housekeeping or managing your Housekeeping? N N  Some recent data might be hidden    Fall/Depression Screening: Fall Risk  06/17/2019 05/30/2019 04/29/2019  Falls in the past year? 0 0 0  Number falls in past yr: 0 0 0  Injury with Fall? 0 - -  Risk for fall due to : Impaired balance/gait - -  Risk for fall due to: Comment - - -  Follow up Falls evaluation completed Falls evaluation completed -    PHQ 2/9 Scores 06/17/2019 05/30/2019 04/29/2019 04/17/2019 04/16/2019  11/16/2018 10/11/2018  PHQ - 2 Score 0 0 0 0 0 0 0   THN CM Care Plan Problem One     Most Recent Value  Care Plan Problem One  Knowledge Deficit in Self Management of Diabetes  Role Documenting the Problem One  [place] for Problem One  Active  THN Long Term Goal   Patient will report a decrease in A1C from 6.9 within the next 90 days  Interventions for Problem One Long Term Goal  RN discusses each outreach how important it is to check his blood sugars and eat healthier. . Rn gives encouragement. RN will follow up each outreach with further discussion.  THN CM Short Term Goal #2   Patient report checking and document blood sugars daily within the next 30 days  Interventions for Short Term Goal #2  RN repeatedly encourages patient to check blood sugars. RN helps patient to get supplies. RN wil continue to follow up outreach  [facility] CM Short Term Goal #3  Patient will report better eating habits within the next 30 days  Interventions for Short Tern Goal #3  RN reiterated to patient that when he goes to thestore to pick healthier foods. RN will follow up with further discussion       Assessment:  A1C 6.9 Patient does not check his blood sugars Patient is getting mom meals delivered Per patient he is using pandemic precautions Patient is still currently smoking and has no desire to quit Plan:  Referred to social worker RN discussed health maintenance care RN reiterated checking blood sugars RN reiterated healthy eating RN will follow up outreach within the month of December  Charles Fox [place] Management [phone]

## 2019-07-02 ENCOUNTER — Other Ambulatory Visit: Payer: Self-pay

## 2019-07-02 NOTE — Patient Outreach (Addendum)
[place] [facility]) Care Management  07/02/2019  Charles Fox 1951-02-20 [id number]   Successful follow up call to patient today.  Patient confirmed receipt of housing resources and Application for Food and Nutrition Services.  Patient requesting assistance with completion of application. Informed patient that, unfortunately, I am unable to schedule a home visit to assist.  Patient provided with contact information for [facility] as they have caseworks on staff that can assist with completion of application.   Reminded patient that lower income and senior housing options tend to have wait lists.  Encouraged him to start contacting agencies regarding application process and availability.  Closing Social Work case but did encourage hm to call if caseworker is unable to provide assistance with application.    Ronn Melena, BSW Social Worker [phone]

## 2019-08-19 ENCOUNTER — Other Ambulatory Visit: Payer: Self-pay | Admitting: *Deleted

## 2019-08-19 NOTE — Patient Outreach (Signed)
[place] [facility]) Care Management  08/19/2019  Charles Fox 10-Jul-1951 [id number]   RN Health Coach attempted follow up outreach call to patient.  Patient was unavailable. HIPPA compliance voicemail message left with return callback number.  Plan: RN will call patient again within 30 days.  [place] Care Management [phone]

## 2019-08-30 ENCOUNTER — Other Ambulatory Visit: Payer: Self-pay | Admitting: Family Medicine

## 2019-08-30 DIAGNOSIS — Z794 Long term (current) use of insulin: Secondary | ICD-10-CM

## 2019-08-30 DIAGNOSIS — I10 Essential (primary) hypertension: Secondary | ICD-10-CM

## 2019-08-30 MED ORDER — ATORVASTATIN CALCIUM 40 MG PO TABS: 40.0000 mg | ORAL_TABLET | Freq: Every day | ORAL | 3 refills | Status: DC

## 2019-08-30 NOTE — Telephone Encounter (Signed)
Patient came into office about his lipitor being out, and wanted to get a refill. Please give pt a call.

## 2019-09-10 ENCOUNTER — Telehealth (INDEPENDENT_AMBULATORY_CARE_PROVIDER_SITE_OTHER): Payer: Medicare Other | Admitting: Family Medicine

## 2019-09-10 ENCOUNTER — Other Ambulatory Visit: Payer: Self-pay

## 2019-09-10 DIAGNOSIS — M25511 Pain in right shoulder: Secondary | ICD-10-CM

## 2019-09-10 DIAGNOSIS — G8929 Other chronic pain: Secondary | ICD-10-CM | POA: Insufficient documentation

## 2019-09-10 DIAGNOSIS — M25512 Pain in left shoulder: Secondary | ICD-10-CM

## 2019-09-10 NOTE — Progress Notes (Signed)
[place] Telemedicine Visit  Patient consented to have virtual visit. Method of visit: Telephone  Encounter participants: Patient: Charles Fox - located at home Provider: Cleophas Dunker - located at [facility] Others (if applicable): none  Chief Complaint: body aches  HPI: Charles Fox is a 68 year old male who presents with complaints of "body aches."  After further questioning, patient notes that he is having pain in his bilateral shoulders, this is chronic, has been occurring since at least September when he was last seen by Dr. Jeannine Kitten.  Patient reports that this is the reason for making his visit.  Has not had any fevers.  Nuys any known COVID-19 contacts.  Is worried that maybe he had the flu because of this pain, but again states that this pain is the same that he was having in September.  States that he would like to have an in person visit if possible.  ROS: per HPI  Pertinent PMHx: Hypertension, left rotator cuff tendinopathy  Exam:  Respiratory: Breathing comfortably on room air, no evidence of respiratory distress  Assessment/Plan:  Chronic pain of both shoulders Patient requesting in person visit for this, does not want to continue discussed over the phone.  Does not seem to have a reason to not come in, although he did report body aches, upon further questioning does clarify that this is bilateral shoulder pain that has been happening since early September.  Advised patient that given that the pain has been happening since September, do not think this is the flu or COVID-19.  He was scheduled for an in person appointment with Dr. Caron Presume on Tuesday 12/29 at 4 PM.    Time spent during visit with patient: 6 minutes

## 2019-09-10 NOTE — Assessment & Plan Note (Signed)
Patient requesting in person visit for this, does not want to continue discussed over the phone.  Does not seem to have a reason to not come in, although he did report body aches, upon further questioning does clarify that this is bilateral shoulder pain that has been happening since early September.  Advised patient that given that the pain has been happening since September, do not think this is the flu or COVID-19.  He was scheduled for an in person appointment with Dr. Caron Presume on Tuesday 12/29 at 4 PM.

## 2019-09-17 ENCOUNTER — Other Ambulatory Visit: Payer: Self-pay

## 2019-09-17 ENCOUNTER — Telehealth (INDEPENDENT_AMBULATORY_CARE_PROVIDER_SITE_OTHER): Payer: Medicare Other | Admitting: Family Medicine

## 2019-09-17 DIAGNOSIS — Z794 Long term (current) use of insulin: Secondary | ICD-10-CM | POA: Diagnosis not present

## 2019-09-17 DIAGNOSIS — E119 Type 2 diabetes mellitus without complications: Secondary | ICD-10-CM | POA: Diagnosis not present

## 2019-09-17 NOTE — Assessment & Plan Note (Signed)
Patient presented to the clinic hoping to get his blood sugars checked.  He reports that his glucometer has not been working and his fingers are hurting from pricking his finger.  He says that his battery may be dead in his glucometer.  Discussed patient options and he wants to go home and change the batteries in his glucometer and will call the clinic with his blood sugar. -Encourage patient to call clinic with blood sugars -Plan for follow-up in 1 week with in person appointment to check blood sugar as well as A1c. -Patient currently taking reported 30 units Lantus daily.

## 2019-09-17 NOTE — Progress Notes (Signed)
[place] Telemedicine Visit  Patient consented to have virtual visit. Method of visit: Telephone  Encounter participants: Patient: Charles Fox - located at [facility] parking lot  Provider: Gifford Shave - located at home   Chief Complaint: Diabetes  HPI: Called patient to discuss what his issues are.  He reports that he has no issues and that he was just hoping to get his blood sugar and blood pressure checked today while at the clinic.  He reports that he is outside the clinic waiting.  I asked if he had shoulder pain which was the describe reason for his visit in the chart and he says that he has chronic shoulder pain but it is not bothering him right now.  Upon chart review it appears that the patient had a telemed visit on 12/22 where he was not sure why he was having the visit either and requested an in person appointment.  He was initially scheduled for an in person appointment with me but that was changed to a telemed visit due to unforeseen circumstances.  I do have concern that he may need to be seen in person and recommend a in person visit next week.  Patient is agreeable to this.  Diabetes  Of concern, patient reports that he has not been checking his blood sugars over the past week.  He says this is because the lancets hurt his fingers as well as the fact that his glucometer is broken..  I offered to put a new prescription in for a new glucometer given his is broken and he reports that he is not really sure if it is broken or if the battery is just dead.  He then goes to say that he thinks the battery is just dead and he will try replacing the batteries.  Patient does report mild weakness and says that he has had low blood sugars in the past and that he may be a little low now.  Patient is alert and oriented to person place time.  I discussed with patient that he needs to go home and replace the batteries in his glucometer and please check his blood glucose and  call the office with those results.  ROS: per HPI  Pertinent PMHx: Diabetes  Exam:  Respiratory: Speaking in clear sentences, no shortness of breath noted.  Patient does have cough while talking to me.  Assessment/Plan:  Diabetes mellitus Patient presented to the clinic hoping to get his blood sugars checked.  He reports that his glucometer has not been working and his fingers are hurting from pricking his finger.  He says that his battery may be dead in his glucometer.  Discussed patient options and he wants to go home and change the batteries in his glucometer and will call the clinic with his blood sugar. -Encourage patient to call clinic with blood sugars -Plan for follow-up in 1 week with in person appointment to check blood sugar as well as A1c. -Patient currently taking reported 30 units Lantus daily.    Time spent during visit with patient: 18 minutes

## 2019-09-18 ENCOUNTER — Other Ambulatory Visit: Payer: Self-pay | Admitting: *Deleted

## 2019-09-18 NOTE — Patient Outreach (Signed)
[facility]) Care Management  09/18/2019  Charles Fox June 02, 1951 [id number]   RN Health Coach attempted follow up outreach call to patient.  Patient was unavailable. HIPPA compliance voicemail message left with return callback number.  Plan: RN will call patient again within 30 days.  [place] Care Management [phone]

## 2019-09-19 ENCOUNTER — Other Ambulatory Visit: Payer: Self-pay | Admitting: *Deleted

## 2019-09-19 NOTE — Patient Outreach (Signed)
[facility]) Care Management  09/19/2019  Charles Fox December 27, 1950 [id number]   RN Health Coach attempted follow up outreach call to patient.  Patient was unavailable. HIPPA compliance voicemail message left with return callback number.  Plan: RN will call patient again within 30 days.  [place] Care Management [phone]

## 2019-09-24 ENCOUNTER — Ambulatory Visit: Payer: Medicare Other | Admitting: Family Medicine

## 2019-09-24 ENCOUNTER — Telehealth: Payer: Medicare (Managed Care)

## 2019-09-24 ENCOUNTER — Telehealth: Payer: Medicare Other

## 2019-09-24 ENCOUNTER — Other Ambulatory Visit: Payer: Self-pay

## 2019-10-03 ENCOUNTER — Other Ambulatory Visit: Payer: Self-pay

## 2019-10-03 ENCOUNTER — Ambulatory Visit: Payer: Medicare (Managed Care)

## 2019-10-03 NOTE — Progress Notes (Signed)
  Chronic Care Management   Outreach Note  10/03/2019 Name: Charles Fox MRN: [id number] DOB: [birth year]  Referred by: [name], MD Reason for referral : Chronic Care Management   An unsuccessful telephone outreach was attempted today. The patient was referred to the case management team by for assistance with care management and care coordination.   Follow Up Plan: A HIPPA compliant phone message was left for the patient providing contact information and requesting a return call.  The care management team will reach out to the patient again over the next 5-7 days  SIGNATURE Regina Eck, PharmD, BCPS Clinical Pharmacist, [place]: [phone]

## 2019-10-10 ENCOUNTER — Ambulatory Visit: Payer: Medicare (Managed Care) | Admitting: Pharmacist

## 2019-10-10 ENCOUNTER — Other Ambulatory Visit: Payer: Self-pay

## 2019-10-10 NOTE — Progress Notes (Signed)
  Chronic Care Management   Outreach Note  10/10/2019 Name: Charles Fox MRN: [id number] DOB: Dec 18, 1950  Referred by: [name], MD Reason for referral : Chronic Care Management (Diabetes)   An unsuccessful telephone outreach was attempted today. The patient was referred to the case management team by for assistance with care management and care coordination.  and A second unsuccessful telephone outreach was attempted today. The patient was referred to the case management team for assistance with care management and care coordination.  Attempting to reach patient to discuss medications and assistance for 2021  Follow Up Plan: A HIPPA compliant phone message was left for the patient providing contact information and requesting a return call.  The care management team will reach out to the patient again over the next 10-14 days.     Regina Eck, PharmD, BCPS Clinical Pharmacist, [place]: [phone]

## 2019-10-15 ENCOUNTER — Emergency Department (HOSPITAL_COMMUNITY)
Admission: EM | Admit: 2019-10-15 | Discharge: 2019-10-15 | Disposition: A | Discharge status: Home / Self Care | Payer: Medicare (Managed Care) | Attending: Emergency Medicine | Admitting: Emergency Medicine

## 2019-10-15 ENCOUNTER — Encounter (HOSPITAL_COMMUNITY): Payer: Self-pay | Admitting: Emergency Medicine

## 2019-10-15 ENCOUNTER — Other Ambulatory Visit: Payer: Self-pay

## 2019-10-15 DIAGNOSIS — F17210 Nicotine dependence, cigarettes, uncomplicated: Secondary | ICD-10-CM | POA: Diagnosis not present

## 2019-10-15 DIAGNOSIS — E119 Type 2 diabetes mellitus without complications: Secondary | ICD-10-CM

## 2019-10-15 DIAGNOSIS — Z79899 Other long term (current) drug therapy: Secondary | ICD-10-CM | POA: Diagnosis not present

## 2019-10-15 DIAGNOSIS — Z7982 Long term (current) use of aspirin: Secondary | ICD-10-CM | POA: Diagnosis not present

## 2019-10-15 DIAGNOSIS — E1165 Type 2 diabetes mellitus with hyperglycemia: Secondary | ICD-10-CM | POA: Insufficient documentation

## 2019-10-15 DIAGNOSIS — Z794 Long term (current) use of insulin: Secondary | ICD-10-CM | POA: Diagnosis not present

## 2019-10-15 DIAGNOSIS — R739 Hyperglycemia, unspecified: Secondary | ICD-10-CM

## 2019-10-15 DIAGNOSIS — I10 Essential (primary) hypertension: Secondary | ICD-10-CM | POA: Insufficient documentation

## 2019-10-15 DIAGNOSIS — R42 Dizziness and giddiness: Secondary | ICD-10-CM | POA: Insufficient documentation

## 2019-10-15 LAB — CBC
HCT: 50.7 % (ref 39.0–52.0)
Hemoglobin: 16.4 g/dL (ref 13.0–17.0)
MCH: 30.3 pg (ref 26.0–34.0)
MCHC: 32.3 g/dL (ref 30.0–36.0)
MCV: 93.5 fL (ref 80.0–100.0)
Platelets: 277 10*3/uL (ref 150–400)
RBC: 5.42 MIL/uL (ref 4.22–5.81)
RDW: 13.2 % (ref 11.5–15.5)
WBC: 9 10*3/uL (ref 4.0–10.5)
nRBC: 0 % (ref 0.0–0.2)

## 2019-10-15 LAB — BASIC METABOLIC PANEL
Anion gap: 7 (ref 5–15)
BUN: 10 mg/dL (ref 8–23)
CO2: 24 mmol/L (ref 22–32)
Calcium: 9 mg/dL (ref 8.9–10.3)
Chloride: 104 mmol/L (ref 98–111)
Creatinine, Ser: 0.68 mg/dL (ref 0.61–1.24)
GFR calc Af Amer: >60 mL/min (ref >60)
GFR calc non Af Amer: >60 mL/min (ref >60)
Glucose, Bld: 112 mg/dL — ABNORMAL HIGH (ref 70–99)
Potassium: 4.4 mmol/L (ref 3.5–5.1)
Sodium: 135 mmol/L (ref 135–145)

## 2019-10-15 LAB — URINALYSIS, ROUTINE W REFLEX MICROSCOPIC
Bacteria, UA: NONE SEEN
Bilirubin Urine: NEGATIVE
Glucose, UA: 150 mg/dL — AB
Hgb urine dipstick: NEGATIVE
Ketones, ur: NEGATIVE mg/dL
Leukocytes,Ua: NEGATIVE
Nitrite: NEGATIVE
Protein, ur: 100 mg/dL — AB
Specific Gravity, Urine: 1.015 (ref 1.005–1.030)
pH: 7 (ref 5.0–8.0)

## 2019-10-15 LAB — CBG MONITORING, ED: Glucose-Capillary: 91 mg/dL (ref 70–99)

## 2019-10-15 MED ORDER — BASAGLAR KWIKPEN 100 UNIT/ML ~~LOC~~ SOPN: 30.0000 [IU] | PEN_INJECTOR | Freq: Every day | SUBCUTANEOUS | 3 refills | Status: DC

## 2019-10-15 MED ORDER — MECLIZINE HCL 25 MG PO TABS: 25.0000 mg | ORAL_TABLET | Freq: Three times a day (TID) | ORAL | 0 refills | Status: DC | PRN

## 2019-10-15 MED ORDER — INSULIN GLARGINE 100 UNIT/ML ~~LOC~~ SOLN: SUBCUTANEOUS | 3 refills | Status: DC

## 2019-10-15 MED ORDER — LOSARTAN POTASSIUM 50 MG PO TABS: 50.0000 mg | ORAL_TABLET | Freq: Every day | ORAL | 2 refills | Status: DC

## 2019-10-15 MED FILL — MECLIZINE 25 MG TABLET: 25 | 10 days supply | Qty: 30 | Fill #0

## 2019-10-15 MED FILL — LOSARTAN POTASSIUM 50 MG TA: 50 | 30 days supply | Qty: 30 | Fill #0

## 2019-10-15 NOTE — ED Provider Notes (Signed)
[facility] EMERGENCY DEPARTMENT Provider Note   CSN: [id number] Arrival date & time: 10/15/19  1218     History Chief Complaint  Patient presents with  . Dizziness    Charles Fox is a 69 y.o. male.  Pt reports he felt dizzy and was worried that his glucose was elevated.  Pt reports he is out of his medications.  Pt is unsure what medications he is out of.   The history is provided by the patient.  Dizziness Quality:  Lightheadedness Severity:  Moderate Onset quality:  Gradual Timing:  Constant Progression:  Worsening Chronicity:  New Relieved by:  Nothing Worsened by:  Nothing Ineffective treatments:  None tried Associated symptoms: no nausea   Risk factors: no anemia        Past Medical History:  Diagnosis Date  . Diabetes mellitus without complication ([place])   . Hypertension     Patient Active Problem List   Diagnosis Date Noted  . Chronic pain of both shoulders 09/10/2019  . Tendinopathy of left rotator cuff 04/29/2019  . Food insecurity 07/09/2018  . Skin lesions, generalized 05/09/2018  . Routine adult health maintenance 03/27/2017  . Hypercholesterolemia 12/22/2016  . Urine test positive for microalbuminuria 05/27/2016  . Diabetes mellitus ([place]) 09/04/2014  . Morbid obesity ([place]) 09/04/2014  . HTN 09/04/2014  . Tobacco use disorder 09/04/2014    Past Surgical History:  Procedure Laterality Date  . BACK SURGERY    . FINGER SURGERY         No family history on file.  Social History   Tobacco Use  . Smoking status: Current Every Day Smoker    Packs/day: 0.50    Types: Cigarettes  . Smokeless tobacco: Never Used  Substance Use Topics  . Alcohol use: No  . Drug use: Not Currently    Home Medications Prior to Admission medications   Medication Sig Start Date End Date Taking? Authorizing Provider  Alcohol Swabs PADS He is to check blood glucose 2-3 times a day 12/04/17   Mercy Riding, MD  aspirin 81 MG tablet Take  1 tablet (81 mg total) by mouth daily. 05/09/18   Glenis Smoker, MD  atorvastatin (LIPITOR) 40 MG tablet Take 1 tablet (40 mg total) by mouth daily. 08/30/19   [name], MD  Blood Glucose Calibration (TRUE METRIX LEVEL 2) Normal SOLN Use to check your blood glucose twice a day 12/20/17   Mercy Riding, MD  blood glucose meter kit and supplies KIT Dispense based on patient and insurance preference. Use up to four times daily as directed. (FOR ICD-9 250.00, 250.01). 04/20/18   Sherene Sires, DO  Blood Glucose Monitoring Suppl (ONETOUCH VERIO) w/Device KIT Use to check blood sugars as directed. 10/18/18   Glenis Smoker, MD  Blood Glucose Monitoring Suppl (TRUE METRIX METER) w/Device KIT Use to check your blood glucose twice a day 12/20/17   Mercy Riding, MD  glucose blood (TRUE METRIX BLOOD GLUCOSE TEST) test strip Use to check your blood glucose twice a day 12/20/17   Mercy Riding, MD  Insulin Glargine (BASAGLAR KWIKPEN) 100 UNIT/ML SOPN Inject 0.3 mLs (30 Units total) into the skin daily. 04/29/19   [name], MD  Insulin Pen Needle (UNIFINE PENTIPS) 31G X 5 MM MISC Use one to inject your insulin once daily 05/30/19   [name], MD  Lancets [facility] ULTRASOFT) lancets Use to check blood glucose 2-3 times a day 12/04/17  Mercy Riding, MD  losartan (COZAAR) 50 MG tablet Take 1 tablet (50 mg total) by mouth at bedtime. 05/30/19 06/29/19  [name], MD  metFORMIN (GLUCOPHAGE-XR) 500 MG 24 hr tablet Take 1 tablet (500 mg total) by mouth daily with breakfast. Patient taking differently: Take 250 mg by mouth daily with breakfast.  07/02/18   Glenis Smoker, MD    Allergies    Patient has no known allergies.  Review of Systems   Review of Systems  Gastrointestinal: Negative for nausea.  Neurological: Positive for dizziness.  All other systems reviewed and are negative.   Physical Exam Updated Vital Signs BP (!) 184/102 (BP Location: Left Arm)   Pulse 95    Temp 98.1 F (36.7 C) (Oral)   Resp 16   Ht _0  (1.727 m)   SpO2 99%   BMI 37.43 kg/m   Physical Exam Vitals and nursing note reviewed.  Constitutional:      Appearance: He is well-developed.  HENT:     Head: Normocephalic and atraumatic.     Right Ear: External ear normal.     Left Ear: External ear normal.     Nose: Nose normal.     Mouth/Throat:     Mouth: Mucous membranes are moist.  Eyes:     Conjunctiva/sclera: Conjunctivae normal.  Cardiovascular:     Rate and Rhythm: Normal rate and regular rhythm.     Heart sounds: No murmur.  Pulmonary:     Effort: Pulmonary effort is normal. No respiratory distress.     Breath sounds: Normal breath sounds.  Abdominal:     Palpations: Abdomen is soft.     Tenderness: There is no abdominal tenderness.  Musculoskeletal:     Cervical back: Neck supple.  Skin:    General: Skin is warm and dry.  Neurological:     General: No focal deficit present.     Mental Status: He is alert.  Psychiatric:        Mood and Affect: Mood normal.     ED Results / Procedures / Treatments   Labs (all labs ordered are listed, but only abnormal results are displayed) Labs Reviewed  BASIC METABOLIC PANEL - Abnormal; Notable for the following components:      Result Value   Glucose, Bld 112 (*)    All other components within normal limits  URINALYSIS, ROUTINE W REFLEX MICROSCOPIC - Abnormal; Notable for the following components:   Glucose, UA 150 (*)    Protein, ur 100 (*)    All other components within normal limits  CBC  CBG MONITORING, ED    EKG EKG Interpretation  Date/Time:  Tuesday October 15 2019 12:33:33 EST Ventricular Rate:  88 PR Interval:  164 QRS Duration: 82 QT Interval:  356 QTC Calculation: 430 R Axis:   -71 Text Interpretation: Normal sinus rhythm Pulmonary disease pattern Left anterior fascicular block Abnormal ECG no significant change since July 2020 Confirmed by Sherwood Gambler [phone]) on 10/15/2019 1:14:51  PM   Radiology No results found.  Procedures Procedures (including critical care time)  Medications Ordered in ED Medications - No data to display  ED Course  I have reviewed the triage vital signs and the nursing notes.  Pertinent labs & imaging results that were available during my care of the patient were reviewed by me and considered in my medical decision making (see chart for details).    MDM Rules/Calculators/A&P  MDM:  Pt looks good.  Pt advised to follow up with [facility] for evaluation.  Rx for lantus, meclizine and losartan An After Visit Summary was printed and given to the patient.  Final Clinical Impression(s) / ED Diagnoses Final diagnoses:  None    Rx / DC Orders ED Discharge Orders         Ordered    losartan (COZAAR) 50 MG tablet  Daily at bedtime     10/15/19 1353    Insulin Glargine (BASAGLAR KWIKPEN) 100 UNIT/ML SOPN  Daily     10/15/19 1353    insulin glargine (LANTUS) 100 UNIT/ML injection     10/15/19 1353    meclizine (ANTIVERT) 25 MG tablet  3 times daily PRN     10/15/19 1353           Sidney Ace 10/15/19 1355    Sherwood Gambler, MD 10/16/19 [phone]

## 2019-10-15 NOTE — ED Triage Notes (Signed)
Pt endorses dizziness while driving today. Pt thought he had blood sugar issue, CBG 91 in triage. Pt A&Ox4, denies any weakness.

## 2019-10-15 NOTE — Discharge Instructions (Signed)
Call family practice to schedule an appointment for evaluation

## 2019-10-17 ENCOUNTER — Other Ambulatory Visit: Payer: Self-pay | Admitting: *Deleted

## 2019-10-17 NOTE — Patient Outreach (Signed)
[facility]) Care Management  10/17/2019  CLANCY LORTZ 1951/02/23 [id number]   [place] has made multiple  Attempts for  follow up outreach call to patient.  Patient was unavailable. HIPPA compliance voicemail message left with return callback number.  Plan: RN will send out unsuccessful outreach letter.  If no response RN Health Coach will close case   [place] Management [phone]

## 2019-10-18 NOTE — Patient Outreach (Signed)
[name] Care Management  10/18/2019  Charles Fox [birth year] [id number]   RN Health Coach case closure. RN discussed with Embedded Team member [facility]. RN will send this case to the Embedded team.  Plan: RN Health Coach case closure Embedded team will follow  [place] Management [phone]

## 2019-10-22 ENCOUNTER — Telehealth: Payer: Medicare (Managed Care)

## 2019-10-24 ENCOUNTER — Telehealth: Payer: Medicare (Managed Care)

## 2019-10-24 ENCOUNTER — Other Ambulatory Visit: Payer: Self-pay

## 2019-10-29 ENCOUNTER — Ambulatory Visit: Payer: Medicare (Managed Care)

## 2019-10-29 ENCOUNTER — Other Ambulatory Visit: Payer: Self-pay

## 2019-10-29 NOTE — Chronic Care Management (AMB) (Signed)
  Care Management   Outreach Note  10/29/2019 Name: Charles Fox MRN: [id number] DOB: Jun 29, 1951  Referred by: [name], MD Reason for referral : Care Coordination (Care Management  RNCM DM II)   An unsuccessful telephone outreach was attempted today. The patient was referred to the case management team for assistance with care management and care coordination.   Follow Up Plan: A HIPPA compliant phone message was left for the patient providing contact information and requesting a return call.  The care management team will reach out to the patient again over the next 5-7 days.   [name] RN, BSN, [facility] Care Management Coordinator [place] Phone: [phone] Fax: [phone]

## 2019-11-05 ENCOUNTER — Ambulatory Visit: Payer: Self-pay | Admitting: Pharmacist

## 2019-11-05 DIAGNOSIS — Z794 Long term (current) use of insulin: Secondary | ICD-10-CM

## 2019-11-05 DIAGNOSIS — E119 Type 2 diabetes mellitus without complications: Secondary | ICD-10-CM

## 2019-11-05 NOTE — Progress Notes (Signed)
  Chronic Care Management   Outreach Note  11/05/2019 Name: Charles Fox MRN: [id number] DOB: 06-21-1951  Referred by: [name], MD Reason for referral : Chronic Care Management   An unsuccessful telephone outreach was attempted today. The patient was referred to the case management team for assistance with care management and care coordination.   Follow Up Plan: A HIPPA compliant phone message was left for the patient providing contact information and requesting a return call.  The care management team will reach out to the patient again over the next 7-10 days.   SIGNATURE Regina Eck, PharmD, BCPS Clinical Pharmacist, [place]: [phone]

## 2019-11-06 ENCOUNTER — Other Ambulatory Visit: Payer: Self-pay

## 2019-11-06 ENCOUNTER — Ambulatory Visit: Payer: Medicare Other

## 2019-11-06 NOTE — Chronic Care Management (AMB) (Signed)
  Care Management   Outreach Note  11/06/2019 Name: Charles Fox MRN: [id number] DOB: 28-May-1951  Referred by: [name], MD Reason for referral : Care Coordination (Care Management RNCM DM II)   A second unsuccessful telephone outreach was attempted today. The patient was referred to the case management team for assistance with care management and care coordination.   Follow Up Plan: A HIPPA compliant phone message was left for the patient providing contact information and requesting a return call.  The care management team will reach out to the patient again over the next 5-7 days.   [name] RN, BSN, [facility] Care Management Coordinator [place] Phone: [phone] Fax: [phone]

## 2019-11-14 ENCOUNTER — Telehealth: Payer: Medicare Other

## 2019-11-18 ENCOUNTER — Ambulatory Visit: Payer: Medicare Other

## 2019-11-18 ENCOUNTER — Other Ambulatory Visit: Payer: Self-pay

## 2019-11-18 NOTE — Chronic Care Management (AMB) (Signed)
  Care Management   Outreach Note  11/18/2019 Name: Charles Fox MRN: [id number] DOB: [birth year]  Referred by: [name], MD Reason for referral : Care Coordination (Care Management RNCM DM TYPE II)   Third unsuccessful telephone outreach was attempted today. The patient was referred to the case management team for assistance with care management and care coordination. The patient's primary care provider has been notified of our unsuccessful attempts to make or maintain contact with the patient. The care management team is pleased to engage with this patient at any time in the future should he/she be interested in assistance from the care management team.   Follow Up Plan: The care management team is available to follow up with the patient after provider conversation with the patient regarding recommendation for care management engagement and subsequent re-referral to the care management team.   [name] RN, BSN, [place] Management Coordinator [place] Phone: [phone] Fax: [phone]

## 2019-11-19 ENCOUNTER — Encounter: Payer: Self-pay | Admitting: Family Medicine

## 2019-11-19 ENCOUNTER — Ambulatory Visit (INDEPENDENT_AMBULATORY_CARE_PROVIDER_SITE_OTHER): Payer: Medicare Other | Admitting: Family Medicine

## 2019-11-19 ENCOUNTER — Other Ambulatory Visit: Payer: Self-pay

## 2019-11-19 VITALS — BP 118/76 | HR 86 | Wt 271.4 lb

## 2019-11-19 DIAGNOSIS — E119 Type 2 diabetes mellitus without complications: Secondary | ICD-10-CM

## 2019-11-19 DIAGNOSIS — Z794 Long term (current) use of insulin: Secondary | ICD-10-CM

## 2019-11-19 DIAGNOSIS — Z0000 Encounter for general adult medical examination without abnormal findings: Secondary | ICD-10-CM

## 2019-11-19 DIAGNOSIS — K0889 Other specified disorders of teeth and supporting structures: Secondary | ICD-10-CM | POA: Diagnosis not present

## 2019-11-19 DIAGNOSIS — Z23 Encounter for immunization: Secondary | ICD-10-CM

## 2019-11-19 LAB — POCT GLYCOSYLATED HEMOGLOBIN (HGB A1C): HbA1c, POC (controlled diabetic range): 9.9 % — AB (ref 0.0–7.0)

## 2019-11-19 MED ORDER — METFORMIN HCL ER 500 MG PO TB24: 500.0000 mg | ORAL_TABLET | Freq: Two times a day (BID) | ORAL | 2 refills | Status: DC

## 2019-11-19 NOTE — Progress Notes (Signed)
    SUBJECTIVE:   CHIEF COMPLAINT / HPI: DM, tooth pain   DM: taking metformin in the morning, insulin at night. Has not been taking his blood sugar because for several weeks because his battery is dead. .    Dental insurance: sates he is having tooth pain in the bottom left jaw. Some of his teeth feel loose.  Last went to a dentist a year ago, but he did not remove the teeth on the bottom, just the top.   Pt does not want pneumovax vaccine today but would like the covid vaccine.  He states he tried getting it done at [facility] but was turned away.    PERTINENT  PMH / PSH: DM  OBJECTIVE:   BP 118/76   Pulse 86   Wt 271 lb 6.4 oz (123.1 kg)   SpO2 100%   BMI 41.27 kg/m   Gen: alert and oriented.  No acute distress. Obese.   CV: soft 1/6 systolic murmur appreciated.  Normal rate and rhythm. No pedal edema. [name]  PULM: LCTAB. No wheezes or crackles.  Extremities: normal sensation in feet bilaterally. No ulcers on feet.    ASSESSMENT/PLAN:   Diabetes mellitus A1c today is 9.9, increased from 6.9 6 months ago.  Pt endorses compliance with his metformin and insulin glargine although is not taking his blood sugar readings. Does not endorse any change in diet.  - increase metformin to BID.  Will increase to 1000mg  BID if tolerating.  - continue insulin glargine, replace batteries and check glucose daily - ophtho exam referral.    Pain, dental Likely d/t dental caries.  Endorses teeth feeling 'loose'.  Has had previous tooth extractions of upper teeth.  Has dental insurance through his medicare advantage.  Advised pt to schedule appointment with his dentist.    Health care maintenance Pt is eligible for AAA screening and low dose lung CT screening due to smoking status.   - discuss this with patient on next visit.     CT chest  [name], MD [place]

## 2019-11-19 NOTE — Patient Instructions (Signed)
I would like you to start taking your metformin twice a day.    I have sent in a diabetic eye exam referral.  Please call one of the businesses I provided you to set up an appointment.   I have provided a list of dentists you can call about your teeth.    I have provided below a few places you can go to set up your vaccine for COVID.     [place] Address: [address], [place], [place] [address], [place] Phone: [phone] Website: [email or web address]  [facility] Address: [address] [place], [place] [address], [place] Arg[place] Phone: [phone]  [facility] Address: [address], [place], [place] [address], [place] Phone: [phone]

## 2019-11-20 DIAGNOSIS — Z23 Encounter for immunization: Secondary | ICD-10-CM | POA: Insufficient documentation

## 2019-11-20 DIAGNOSIS — K0889 Other specified disorders of teeth and supporting structures: Secondary | ICD-10-CM

## 2019-11-20 HISTORY — DX: Other specified disorders of teeth and supporting structures: K08.89

## 2019-11-20 NOTE — Assessment & Plan Note (Signed)
Pt is eligible for AAA screening and low dose lung CT screening due to smoking status.   - discuss this with patient on next visit.

## 2019-11-20 NOTE — Assessment & Plan Note (Signed)
A1c today is 9.9, increased from 6.9 6 months ago.  Pt endorses compliance with his metformin and insulin glargine although is not taking his blood sugar readings. Does not endorse any change in diet.  - increase metformin to BID.  Will increase to 1000mg  BID if tolerating.  - continue insulin glargine, replace batteries and check glucose daily - ophtho exam referral.

## 2019-11-20 NOTE — Assessment & Plan Note (Signed)
Likely d/t dental caries.  Endorses teeth feeling 'loose'.  Has had previous tooth extractions of upper teeth.  Has dental insurance through his medicare advantage.  Advised pt to schedule appointment with his dentist.

## 2019-11-26 ENCOUNTER — Ambulatory Visit: Payer: Self-pay | Admitting: Pharmacist

## 2019-11-26 DIAGNOSIS — E119 Type 2 diabetes mellitus without complications: Secondary | ICD-10-CM

## 2019-11-26 NOTE — Progress Notes (Signed)
  Chronic Care Management   Outreach Note  11/26/2019 Name: Charles Fox MRN: [id number] DOB: 12/20/1950  Referred by: [name], MD Reason for referral : Chronic Care Management and Diabetes   Third unsuccessful telephone outreach was attempted today. The patient was referred to the case management team for assistance with care management and care coordination. The patient's primary care provider has been notified of our unsuccessful attempts to make or maintain contact with the patient. The care management team is pleased to engage with this patient at any time in the future should he/she be interested in assistance from the care management team.   Follow Up Plan: No further follow up required: CCM pharmD has attempted outreach to patient x3 with no return call  SIGNATURE Regina Eck, PharmD, BCPS Clinical Pharmacist, [place]: [phone]

## 2019-11-27 ENCOUNTER — Telehealth: Payer: Self-pay

## 2019-11-27 NOTE — Telephone Encounter (Signed)
Patient returning phone call to nurse line regarding diabetic medication management. Patient reports that he is almost out of insulin.    Please advise  Charles Grumbling, RN

## 2019-11-27 NOTE — Telephone Encounter (Signed)
Patient contacted and reports that is insulin is "just too expensive"  He notes that he had received insulin for 1 year after Dr. Lindell Noe had helped him.    He reports taking 30 units of insulin glargine daily.   I provided Medication Samples for patient to pick up later this afternoon (in refrigerator).   Drug name: Lantus (insulin glargine)       Strength: 100units/ml        Qty: 2 pens  LOT: [id number]  Exp.Date: 06/18/2021  Dosing instructions: continue 30 units daily.   The patient has been instructed regarding the correct time, dose, and frequency of taking this medication, including desired effects and most common side effects.   [name] 4:03 PM 11/27/2019   Patient and I discussed that a Pharmacist from [facility] [name]) has been trying to contact him to fill out additional paperwork.  She appears to have attempted yesterday.  I asked him to work with [name] to complete paperwork for additional supply of insulin glargine from [place].  He recognized that he had received insulin from [employer] in the past.    He plans to pick up insulin before the end of the business today.

## 2019-11-29 ENCOUNTER — Ambulatory Visit: Payer: Self-pay | Admitting: Pharmacist

## 2019-11-29 DIAGNOSIS — E119 Type 2 diabetes mellitus without complications: Secondary | ICD-10-CM

## 2019-11-29 NOTE — Progress Notes (Signed)
  Chronic Care Management   Outreach Note  11/29/2019 Name: Charles Fox MRN: [id number] DOB: May 15, 1951  Referred by: [name], MD Reason for referral : Chronic Care Management and Diabetes   A second unsuccessful telephone outreach was attempted today. The patient was referred to the case management team for assistance with care management and care coordination.   Follow Up Plan: No further follow up required: will hand off to [place] team to assist with medication assistance and titration of medications  SIGNATURE Regina Eck, PharmD, BCPS Clinical Pharmacist, [place]: [phone]

## 2019-12-19 ENCOUNTER — Telehealth: Payer: Self-pay | Admitting: Family Medicine

## 2019-12-19 ENCOUNTER — Other Ambulatory Visit: Payer: Self-pay | Admitting: Family Medicine

## 2019-12-19 MED ORDER — BASAGLAR KWIKPEN 100 UNIT/ML ~~LOC~~ SOPN: 30.0000 [IU] | PEN_INJECTOR | Freq: Every day | SUBCUTANEOUS | 3 refills | Status: DC

## 2019-12-19 MED FILL — BASAGLAR 100 UNIT/ML KWIKPE: 100 | 10 days supply | Qty: 3 | Fill #0

## 2019-12-19 NOTE — Telephone Encounter (Signed)
Pt is at office stating that the pharmacy won't give him insulin which is his Nancee Liter, they are saying his script is expired. Pt is out and needs a refill.

## 2019-12-19 NOTE — Telephone Encounter (Signed)
Pt informed. Charles Fox Charles Fox, CMA  

## 2019-12-19 NOTE — Telephone Encounter (Signed)
I refilled it

## 2020-01-01 MED FILL — BASAGLAR 100 UNIT/ML KWIKPE: 100 | 20 days supply | Qty: 6 | Fill #1

## 2020-01-07 ENCOUNTER — Ambulatory Visit (INDEPENDENT_AMBULATORY_CARE_PROVIDER_SITE_OTHER): Payer: Medicare Other | Admitting: Family Medicine

## 2020-01-07 ENCOUNTER — Other Ambulatory Visit: Payer: Self-pay

## 2020-01-07 VITALS — BP 138/70 | HR 82 | Ht 68.0 in | Wt 272.0 lb

## 2020-01-07 DIAGNOSIS — E7800 Pure hypercholesterolemia, unspecified: Secondary | ICD-10-CM | POA: Diagnosis not present

## 2020-01-07 DIAGNOSIS — I10 Essential (primary) hypertension: Secondary | ICD-10-CM | POA: Diagnosis not present

## 2020-01-07 DIAGNOSIS — Z794 Long term (current) use of insulin: Secondary | ICD-10-CM

## 2020-01-07 DIAGNOSIS — E119 Type 2 diabetes mellitus without complications: Secondary | ICD-10-CM

## 2020-01-07 MED ORDER — LOSARTAN POTASSIUM 50 MG PO TABS: 50.0000 mg | ORAL_TABLET | Freq: Every day | ORAL | 2 refills | Status: DC

## 2020-01-07 MED ORDER — ATORVASTATIN CALCIUM 40 MG PO TABS: 40.0000 mg | ORAL_TABLET | Freq: Every day | ORAL | 3 refills | Status: DC

## 2020-01-07 NOTE — Progress Notes (Signed)
   SUBJECTIVE:   CHIEF COMPLAINT / HPI: having dental procedure tomorrow needs refills on med   Hypertension: - Medications: losartan - Compliance: yes - Checking BP at home: no - Denies any SOB, CP, vision changes, LE edema, medication SEs, or symptoms of hypotension - on statin and needs refill  PERTINENT  PMH / PSH: T2DM, HTN  OBJECTIVE:  BP 138/70   Pulse 82   Ht 5\' 8"  (1.727 m)   Wt 272 lb (123.4 kg)   SpO2 100%   BMI 41.36 kg/m   General: NAD, pleasant Neck: Supple, no LAD Respiratory: normal work of breathing Neuro: CN II-XII grossly intact Psych: AOx3, appropriate affect  ASSESSMENT/PLAN:   HTN BP at goal. Continue current treatment  Hypercholesterolemia Refilled lipitor. Patient needs repeat lipid panel, last done in 2019, would not like to collect today but at a later time.    [place] Jessalyn Hinojosa, DO PGY-3, [facility]

## 2020-01-07 NOTE — Patient Instructions (Signed)
Thank you for coming to see me today. It was a pleasure! Today we talked about:   I have refilled your medications. I have placed a referral for pharmacy to help you with affording medications.   The losartan is for blood pressure and kidney protection and the lipitor is for cholesterol and to prevent strokes and heart attacks.    Please follow-up as needed. If you have any questions or concerns, please do not hesitate to call the office at [phone].  Take Care,  [place] Vada Yellen, DO  Family Medicine [employer]  Housing Resources: [name] [employer]:   www.gha-[place].org/  [phone] [address] [place], [place], [place] [address]   . [name] Housing Medical sales representative.[email or web address]   [phone]  . [place]:  [phone]     [place], [place], [place] [address]    [place]:  [phone]     [place], [place], [place] [address]    . Affordable Housing Management:  2088662054  [email or web address]  [address] [place] [address] [place], [place] [address]  . [employer]: [place] By appointment only - Call [phone]   . Homeless: Museum/gallery curator [facility]) [phone]   [address] . SNAP/ Food benefits: [facility] [place] ;     [place], [facility]) Nutrition program [place] [phone];     [place] [phone]    . [employer] [place]  (Every Thursday 9AM to 11:00AM) [employer]      [place]  . [place]:  [address] [place], [place]  [phone]

## 2020-01-08 NOTE — Assessment & Plan Note (Signed)
Refilled lipitor. Patient needs repeat lipid panel, last done in 2019, would not like to collect today but at a later time.

## 2020-01-08 NOTE — Assessment & Plan Note (Signed)
BP at goal. Continue current treatment

## 2020-01-22 ENCOUNTER — Telehealth: Payer: Self-pay | Admitting: Family Medicine

## 2020-01-22 MED FILL — UNIFINE PENTIPS 31GX3/16: 31G X 5 MM | 90 days supply | Qty: 100 | Fill #0

## 2020-01-22 MED FILL — BASAGLAR 100 UNIT/ML KWIKPE: 100 | 20 days supply | Qty: 6 | Fill #2

## 2020-01-22 NOTE — Telephone Encounter (Signed)
Patient is at the office wanting to know can the doctor call in the vial insulin with needles, he is saying he can't afford the pens. Thanks

## 2020-01-23 ENCOUNTER — Other Ambulatory Visit: Payer: Self-pay | Admitting: Family Medicine

## 2020-01-23 MED ORDER — "INSULIN SYRINGE 30G X 1/2"" 0.5 ML MISC": 3 refills | Status: DC

## 2020-01-23 MED ORDER — INSULIN GLARGINE 100 UNIT/ML ~~LOC~~ SOLN: 30.0000 [IU] | Freq: Every day | SUBCUTANEOUS | 11 refills | Status: DC

## 2020-01-23 NOTE — Telephone Encounter (Signed)
[facility]. Please let pt know.

## 2020-01-24 MED FILL — LOSARTAN POTASSIUM 50 MG TA: 50 | 30 days supply | Qty: 30 | Fill #1

## 2020-01-25 ENCOUNTER — Other Ambulatory Visit (HOSPITAL_COMMUNITY): Payer: Self-pay | Admitting: Family Medicine

## 2020-01-25 ENCOUNTER — Other Ambulatory Visit: Payer: Self-pay | Admitting: Family Medicine

## 2020-01-25 DIAGNOSIS — I10 Essential (primary) hypertension: Secondary | ICD-10-CM

## 2020-01-25 DIAGNOSIS — Z794 Long term (current) use of insulin: Secondary | ICD-10-CM

## 2020-01-25 MED ORDER — BASAGLAR KWIKPEN 100 UNIT/ML ~~LOC~~ SOPN: 30.0000 [IU] | PEN_INJECTOR | Freq: Every day | SUBCUTANEOUS | 3 refills | Status: DC

## 2020-01-25 MED ORDER — ATORVASTATIN CALCIUM 40 MG PO TABS: 40.0000 mg | ORAL_TABLET | Freq: Every day | ORAL | 3 refills | Status: DC

## 2020-01-25 NOTE — Progress Notes (Signed)
Pt decided to stick with basaglar as lantus was more expensive.  Sent this and atorvostatin to [facility].

## 2020-01-27 NOTE — Telephone Encounter (Signed)
LVM saying Rx was sent to [facility] and if there are any questions, call our office. Ottis Stain, CMA

## 2020-02-06 MED FILL — BASAGLAR 100 UNIT/ML KWIKPE: 100 | 30 days supply | Qty: 9 | Fill #0

## 2020-02-20 ENCOUNTER — Telehealth: Payer: Self-pay | Admitting: Family Medicine

## 2020-02-21 ENCOUNTER — Other Ambulatory Visit (HOSPITAL_COMMUNITY): Payer: Self-pay | Admitting: Family Medicine

## 2020-02-21 ENCOUNTER — Other Ambulatory Visit: Payer: Self-pay

## 2020-02-21 ENCOUNTER — Ambulatory Visit (INDEPENDENT_AMBULATORY_CARE_PROVIDER_SITE_OTHER): Payer: Medicare HMO | Admitting: Family Medicine

## 2020-02-21 VITALS — BP 142/70 | HR 76 | Wt 266.6 lb

## 2020-02-21 DIAGNOSIS — E119 Type 2 diabetes mellitus without complications: Secondary | ICD-10-CM | POA: Diagnosis not present

## 2020-02-21 DIAGNOSIS — Z794 Long term (current) use of insulin: Secondary | ICD-10-CM

## 2020-02-21 LAB — POCT GLYCOSYLATED HEMOGLOBIN (HGB A1C): HbA1c, POC (controlled diabetic range): 10.4 % — AB (ref 0.0–7.0)

## 2020-02-21 MED ORDER — TRESIBA FLEXTOUCH 100 UNIT/ML ~~LOC~~ SOPN: 30.0000 [IU] | PEN_INJECTOR | Freq: Every day | SUBCUTANEOUS | 11 refills | Status: DC

## 2020-02-21 MED ORDER — TRESIBA FLEXTOUCH 100 UNIT/ML ~~LOC~~ SOPN: 100.0000 [IU] | PEN_INJECTOR | Freq: Every day | SUBCUTANEOUS | 0 refills | Status: DC

## 2020-02-21 MED ORDER — BLOOD GLUCOSE MONITOR KIT: PACK | 0 refills | Status: DC

## 2020-02-21 MED ORDER — INSULIN GLARGINE 100 UNIT/ML SOLOSTAR PEN: 30.0000 [IU] | PEN_INJECTOR | SUBCUTANEOUS | 0 refills | Status: DC

## 2020-02-21 MED FILL — TRESIBA FLEXTOUCH 100 UNITS: 100 | 30 days supply | Qty: 9 | Fill #0

## 2020-02-21 NOTE — Assessment & Plan Note (Addendum)
Provided patient with 2 samples of Tresiba today.  It appears that his insurance covers [place].  Confirmed with [facility] the cost of monthly [name] which is $47 for 3 pens.  Also printed a prescription for a blood glucose meter and asked patient to call his insurance company to find cost of lancets and test strips monthly.  Patient denies any hypoglycemia at this time.  Obtain A1c today and lipid panel. Patient's most recent A1C's worsening. Suspect due to non-compliance. Patient reported he was only taking one tab of metformin daily. Informed him to increase to twice daily as prescribed. Last A1C 9.9, not at goal. Pending A1c, to follow-up in 1 to 3 months. Sent 1 year supply of Tresiba to [facility].  Patient was able to answer many of my questions but did seem confused when I was talking about his medications.  I asked him to bring his medications to his next visit so that we can help organize for him.  I also updated his medication list and showed in on the AVS.  I also reminded patient to pick up his phone and check his voicemails as care management has tried to call previously with no success.  Appears that Dr. Enid Derry set up another referral for care management.  Will forward this encounter to [employer].  A1c returned at 10.4.  Called patient and left voicemail to follow-up in 1 month.  Also encouraged him to bring in blood sugar values.

## 2020-02-21 NOTE — Progress Notes (Signed)
    SUBJECTIVE:   CHIEF COMPLAINT / HPI:   Diabetes  Patient presents for diabetes medications.  He reports that he is completely out of Lantus at this time.  Per chart review, it appears that patient has received several samples from the clinic as he has a difficult time affording Lantus at the pharmacy.  Patient denies any hypoglycemia.  He does not take his blood sugars at home.  He denies any chest pain, changes in vision.   PERTINENT  PMH / PSH: Diabetes, tobacco use, hypertension  OBJECTIVE:   BP (!) 142/70   Pulse 76   Wt 266 lb 9.6 oz (120.9 kg)   SpO2 99%   BMI 40.54 kg/m   General: Obese male, no acute distress   ASSESSMENT/PLAN:   Diabetes mellitus Provided patient with 2 samples of Tresiba today.  It appears that his insurance covers [place].  Confirmed with [facility] the cost of monthly [name] which is $47 for 3 pens.  Also printed a prescription for a blood glucose meter and asked patient to call his insurance company to find cost of lancets and test strips monthly.  Patient denies any hypoglycemia at this time.  Obtain A1c today and lipid panel. Patient's most recent A1C's worsening. Suspect due to non-compliance. Patient reported he was only taking one tab of metformin daily. Informed him to increase to twice daily as prescribed. Last A1C 9.9, not at goal. Pending A1c, to follow-up in 1 to 3 months. Sent 1 year supply of Tresiba to [facility].  Patient was able to answer many of my questions but did seem confused when I was talking about his medications.  I asked him to bring his medications to his next visit so that we can help organize for him.  I also updated his medication list and showed in on the AVS.  I also reminded patient to pick up his phone and check his voicemails as care management has tried to call previously with no success.  Appears that Dr. Enid Derry set up another referral for care management.  Will forward this encounter to  [employer].   Wilber Oliphant, MD [place]

## 2020-02-21 NOTE — Progress Notes (Deleted)
    SUBJECTIVE:   CHIEF COMPLAINT / HPI:   Diabetes Takes metformin tab per day, atorvastatin 40 mg, 40 units of basaglar. Not checking blood sugars at home.   Patient reports feeling weak but not when taking his insulin.  Takes losartan.   Gets medications at   [place] [facility] [place]: ***  OBJECTIVE:   BP (!) 142/70   Pulse 76   Wt 266 lb 9.6 oz (120.9 kg)   SpO2 99%   BMI 40.54 kg/m   ***  ASSESSMENT/PLAN:   No problem-specific Assessment & Plan notes found for this encounter.     Wilber Oliphant, MD [place]

## 2020-02-21 NOTE — Patient Instructions (Addendum)
We are switching your insulin [name] (which is very similar to the medication you were on previously). I called to [facility] and they said that this medication is better covered by your insurance. It will cost $47 for a month supply (3 months) of [place]. I have sent in the prescription to the pharmacy for you to pick up as soon as possible.  You will need to call your insurance company to see which glucose monitoring system they cover.  Usually, you can get the monitor for free, but your insurance will charge for the test strips and lancets.  We will ask Care Management to try to reach out to you again for medication help. Please make sure to keep your phone nearby and check your voicemails.

## 2020-02-22 LAB — LIPID PANEL
Chol/HDL Ratio: 2.9 ratio (ref 0.0–5.0)
Cholesterol, Total: 117 mg/dL (ref 100–199)
HDL: 41 mg/dL (ref >39)
LDL Chol Calc (NIH): 55 mg/dL (ref 0–99)
Triglycerides: 112 mg/dL (ref 0–149)
VLDL Cholesterol Cal: 21 mg/dL (ref 5–40)

## 2020-02-25 ENCOUNTER — Telehealth: Payer: Self-pay | Admitting: Family Medicine

## 2020-02-25 NOTE — Chronic Care Management (AMB) (Signed)
Chronic Care Management   Note  02/25/2020 Name: Charles Fox MRN: [id number] DOB: September 10, 1951  Charles Fox is a 69 y.o. year old male who is a primary care patient of [name], MD. I reached out to [name] by phone today in response to a referral sent by [name], Dr. Addison Naegeli      [name] about Chronic Care Management services today including:  1. CCM service includes personalized support from designated clinical staff supervised by his physician, including individualized plan of care and coordination with other care providers 2. 24/7 contact phone numbers for assistance for urgent and routine care needs. 3. Service will only be billed when office clinical staff spend 20 minutes or more in a month to coordinate care. 4. Only one practitioner may furnish and bill the service in a calendar month. 5. The patient may stop CCM services at any time (effective at the end of the month) by phone call to the office staff. 6. The patient will be responsible for cost sharing (co-pay) of up to 20% of the service fee (after annual deductible is met).  Patient agreed to services and verbal consent obtained.   Follow up plan: Telephone appointment with care management team member scheduled for:02/26/2020  Glenna Durand, [place] Management ??[email or web address]  ??[phone]

## 2020-02-26 ENCOUNTER — Ambulatory Visit: Payer: Medicare HMO

## 2020-02-26 ENCOUNTER — Other Ambulatory Visit: Payer: Self-pay

## 2020-02-26 NOTE — Patient Instructions (Signed)
Visit Information  Goals Addressed            This Visit's Progress   . I don't understand about my diabetes (pt-stated)       CARE PLAN ENTRY (see longtitudinal plan of care for additional care plan information)  Objective:  Lab Results  Component Value Date   HGBA1C 10.4 (A) 02/21/2020 .   Lab Results  Component Value Date   CREATININE 0.68 10/15/2019   CREATININE 0.82 04/11/2019   CREATININE 0.87 03/26/2018 .   [name] No results found for: EGFR  Current Barriers:  [name] Knowledge Deficits related to basic Diabetes pathophysiology and self care/management . Difficulty obtaining or cannot afford medications . Does not have glucometer to monitor blood sugar  Case Manager Clinical Goal(s):  Over the next 90 days, patient will demonstrate improved adherence to prescribed treatment plan for diabetes self care/management as evidenced by: lower his a1c by 1-2 points . daily monitoring and recording of CBG   Interventions:  . Provided education to patient about basic DM disease process . Reviewed medications with patient and discussed importance of medication adherence . Discussed plans with patient for ongoing care management follow up and provided patient with direct contact information for care management team . Provided patient with written educational materials related to hypo and hyperglycemia and importance of correct treatment . Reviewed scheduled/upcoming provider appointments including: 03/04/20 appointment with [facility] person for teaching about DM . Collaborated with LCSW [name] in regards to housing and food shortage . Talked with the patient about getting a glucometer to check his blood sugars.  He states that he does not have one he has spoke with his physician about getting one and RNCM has sent a message to see if there is one available.   Patient Self Care Activities:  . UNABLE to independently self manage diabetes  Initial goal documentation        Mr.  Meints was given information about Care Management services today including:  1. Care Management services include personalized support from designated clinical staff supervised by his physician, including individualized plan of care and coordination with other care providers 2. 24/7 contact phone numbers for assistance for urgent and routine care needs. 3. The patient may stop CCM services at any time (effective at the end of the month) by phone call to the office staff.  Patient agreed to services and verbal consent obtained.   The patient verbalized understanding of instructions provided today and declined a print copy of patient instruction materials.   The care management team will reach out to the patient again over the next 14 days.  The patient has been provided with contact information for the care management team and has been advised to call with any health related questions or concerns.   [name] RN, BSN, [facility] Care Management Coordinator [place] Phone: [phone] Fax: [phone]

## 2020-02-26 NOTE — Chronic Care Management (AMB) (Signed)
Care Management   Initial Visit Note  02/26/2020 Name: Charles Fox MRN: [id number] DOB: 15-Oct-1950   Assessment: Charles Fox is a 69 y.o. year old male who sees [name], MD for primary care. The care management team was consulted for assistance with care management and care coordination needs related to Disease Management Educational Needs Dm II / HTN.   Review of patient status, including review of consultants reports, relevant laboratory and other test results, and collaboration with appropriate care team members and the patient's provider was performed as part of comprehensive patient evaluation and provision of care management services.    SDOH (Social Determinants of Health) assessments performed: Yes See Care Plan activities for detailed interventions related to [facility])     Outpatient Encounter Medications as of 02/26/2020  Medication Sig  . Alcohol Swabs PADS He is to check blood glucose 2-3 times a day  . aspirin 81 MG tablet Take 1 tablet (81 mg total) by mouth daily.  [name] atorvastatin (LIPITOR) 40 MG tablet Take 1 tablet (40 mg total) by mouth daily.  . blood glucose meter kit and supplies KIT Dispense based on patient and insurance preference. Use up to four times daily as directed. (FOR ICD-9 250.00, 250.01).  . insulin degludec (TRESIBA FLEXTOUCH) 100 UNIT/ML FlexTouch Pen Inject 0.3 mLs (30 Units total) into the skin daily.  . Insulin Syringe-Needle U-100 (INSULIN SYRINGE .5CC/30GX1/2") 30G X 1/2" 0.5 ML MISC Use with glargine. Dispose after each use.  . Lancets (ONETOUCH ULTRASOFT) lancets Use to check blood glucose 2-3 times a day  . losartan (COZAAR) 50 MG tablet Take 1 tablet (50 mg total) by mouth at bedtime.  . metFORMIN (GLUCOPHAGE-XR) 500 MG 24 hr tablet Take 1 tablet (500 mg total) by mouth 2 (two) times daily with a meal.   No facility-administered encounter medications on file as of 02/26/2020.    Goals Addressed            This Visit's Progress    . I don't understand about my diabetes (pt-stated)       CARE PLAN ENTRY (see longtitudinal plan of care for additional care plan information)  Objective:  Lab Results  Component Value Date   HGBA1C 10.4 (A) 02/21/2020 .   Lab Results  Component Value Date   CREATININE 0.68 10/15/2019   CREATININE 0.82 04/11/2019   CREATININE 0.87 03/26/2018 .   [name] No results found for: EGFR  Current Barriers:  [name] Knowledge Deficits related to basic Diabetes pathophysiology and self care/management . Difficulty obtaining or cannot afford medications . Does not have glucometer to monitor blood sugar  Case Manager Clinical Goal(s):  Over the next 90 days, patient will demonstrate improved adherence to prescribed treatment plan for diabetes self care/management as evidenced by: lower his a1c by 1-2 points . daily monitoring and recording of CBG   Interventions:  . Provided education to patient about basic DM disease process . Reviewed medications with patient and discussed importance of medication adherence . Discussed plans with patient for ongoing care management follow up and provided patient with direct contact information for care management team . Provided patient with written educational materials related to hypo and hyperglycemia and importance of correct treatment . Reviewed scheduled/upcoming provider appointments including: 03/04/20 appointment with [facility] in person for teaching about DM . Collaborated with LCSW [name] in regards to housing and food shortage . Talked with the patient about getting a glucometer to check his blood sugars.  He states  that he does not have one he has spoke with his physician about getting one and RNCM has sent a message to see if there is one available.   Patient Self Care Activities:  . UNABLE to independently self manage diabetes  Initial goal documentation         Follow up plan:  The care management team will reach out to the patient again over  the next 14 days.  The patient has been provided with contact information for the care management team and has been advised to call with any health related questions or concerns.   [name] was given information about Care Management services today including:  1. Care Management services include personalized support from designated clinical staff supervised by a physician, including individualized plan of care and coordination with other care providers 2. 24/7 contact phone numbers for assistance for urgent and routine care needs. 3. The patient may stop Care Management services at any time (effective at the end of the month) by phone call to the office staff.  Patient agreed to services and verbal consent obtained.  [name] RN, BSN, [facility] Care Management Coordinator [place] Phone: [phone] Fax: [phone]

## 2020-02-28 ENCOUNTER — Ambulatory Visit: Payer: Self-pay | Admitting: Licensed Clinical Social Worker

## 2020-02-28 NOTE — Chronic Care Management (AMB) (Signed)
  Social Work Care Management  Unsuccessful Phone Outreach   02/28/2020 Name: Charles Fox MRN: [id number] DOB: [birth year]  Referred by:  CCM RN care manager Reason for referral : Care Coordination (SDOH assessment of needs)  Charles Fox is a 69 y.o. year old male who sees [name], MD for primary care.  LCSW received referral to assess patient's needs for food,  and housing resources. As well as possible isolation.  LCSW called patient to assess needs and barriers reference the above referral. Telephone outreach was unsuccessful. A HIPPA compliant phone message was left for the patient providing contact information and requesting a return call. Plan:  If no return call is received. LCSW will call again in 3 ot 5 days. Intervention: Collaboration with CCM RN on patient's needs.  Review of patient status, including review of consultants reports, relevant laboratory and other test results, and collaboration with appropriate care team members and the patient's provider was performed as part of comprehensive patient evaluation and provision of care management services.   [name], LCSW Care Management Coordinator [place] / [place]   [phone] 2:40 PM

## 2020-03-04 ENCOUNTER — Other Ambulatory Visit: Payer: Self-pay

## 2020-03-04 ENCOUNTER — Ambulatory Visit: Payer: Medicare HMO

## 2020-03-05 ENCOUNTER — Other Ambulatory Visit: Payer: Self-pay

## 2020-03-05 ENCOUNTER — Ambulatory Visit: Payer: Medicare HMO | Admitting: Licensed Clinical Social Worker

## 2020-03-05 DIAGNOSIS — Z139 Encounter for screening, unspecified: Secondary | ICD-10-CM

## 2020-03-05 DIAGNOSIS — Z5941 Food insecurity: Secondary | ICD-10-CM

## 2020-03-05 DIAGNOSIS — Z789 Other specified health status: Secondary | ICD-10-CM

## 2020-03-06 NOTE — Chronic Care Management (AMB) (Signed)
Care Management   Clinical Social Work initial Note  03/06/2020 Name: Charles Fox MRN: [id number] DOB: 12-Apr-1951 Charles Fox is a 69 y.o. year old male who sees [name], MD for primary care. The Care Management team was consulted to assist the patient with [place], housing options.  LCSW reached out to [name] today by phone to introduce self, assess needs and barriers to care.   Assessment: Patient is pleasant and engaged in conversation.  Experiencing financial difficulties which seems to be exacerbated by a large portion of his income going to rent. Patient also expressed concerns of not being able to eat due to dental pain. Need help with coordinating dental needs.    Recommendation: Patient may benefit from, and is in agreement to participate in [facility] food box program, look for affordable housing. Plan: LCSW will F/U with patient in 3 to 5 days  Review of patient status, including review of consultants reports, relevant laboratory and other test results, and collaboration with appropriate care team members and the patient's provider was performed as part of comprehensive patient evaluation and provision of chronic care management services.    Advance Directive Status:Not addressed in this encounter SDOH (Social Determinants of Health) assessments performed: Yes:  SDOH Interventions     Most Recent Value  SDOH Interventions  SDOH Interventions for the Following Domains Housing  Food Insecurity Interventions Other (Comment)  [facility] Food box program]  Financial Strain Interventions [id number] Referral  Housing Interventions Other (Comment)  [referral to [place]      ; Goals Addressed            This Visit's Progress   . Affordable Housing       CARE PLAN ENTRY (see longitudinal plan of care for additional care plan information)  Current Barriers:  . Patient having difficulty affording current housing needs community resources for affordable  housing options . Patient acknowledges deficits and needs support, education and care coordination in order to meet this unmet need  . Lacks knowledge of community resource: and where to start Clinical Goal(s)  . Over the next 30 days, patient will work with [place]  to explore community housing options Interventions provided by LCSW:  . Assessment of needs and barriers to care as well as how impacting    . Discussed all housing options to include ALF ( patient not interested) . Provided phone number to [facility] apartment  which is income based [phone]  . Made housing referral to [place] . Motivational Interviewing  Patient Self Care Activities & Deficits:  . Patient is unable to independently navigate community resource options without care coordination support  . Patient is motivated to resolve concern  . Patient is able to contact [facility] as discussed today Initial goal documentation    . Need My teeth Fixed       CARE PLAN ENTRY (see longitudinal plan of care for additional care plan information)  Current Barriers:  . Patient with dental pain needs go to a dentist,  . Acknowledges deficits and needs support, education and care coordination in order to meet this unmet need  . Just received new insurance card and does not know if dental is covered Clinical Goal(s):  Over the next 30 days, patient will address his unmet dental need and be able to eat and be free of pain  Interventions: . Assessed patient needs, what they have used in the past and how currently meeting  needs . Collaborated with CCM RN regarding patient needs and request  . Collaborated with [facility] front office staff on insurance information Patient Self Care Activities: . Patient is unable to independently navigate getting dental needs met without care coordination support  . Patient will call phone number on back of card to inquire about dental Initial goal documentation      .  Obtain Health food       CARE PLAN ENTRY (see longitudinal plan of care for additional care plan information)  Current Barriers:  . Patient with food insecurities needs community resources,  . Patient acknowledges deficits and needs support, education and care coordination in order to meet this unmet need  . Lost food stamp card ( waiting for new card) . Financial constraints of not having money to purchase food Clinical Goal(s)  . Over the next 30 days patient will be able to have healthy food as demonstrated by improved health Interventions provided by LCSW:  . Assessment of needs and barriers to care, what patient is currently doing as well as how impacting him . Provided patient with information about [employer] food box program and obtained approval to complete application and send referral . Provided patient with 2 food boxes from [facility] . Motivational Interviewing  Patient Self Care Activities & Deficits:  . Patient is unable to independently navigate community resource options without care coordination support  . Patient is motivated to resolve concern  . Patient is able to go to [employer] to pick up food bi weekly  as discussed today Initial goal documentation      Outpatient Encounter Medications as of 03/05/2020  Medication Sig  . Alcohol Swabs PADS He is to check blood glucose 2-3 times a day  . aspirin 81 MG tablet Take 1 tablet (81 mg total) by mouth daily.  [name] atorvastatin (LIPITOR) 40 MG tablet Take 1 tablet (40 mg total) by mouth daily.  . blood glucose meter kit and supplies KIT Dispense based on patient and insurance preference. Use up to four times daily as directed. (FOR ICD-9 250.00, 250.01). (Patient not taking: Reported on 03/04/2020)  . Blood Glucose Monitoring Suppl (ONETOUCH VERIO) w/Device KIT by Does not apply route.  . insulin degludec (TRESIBA FLEXTOUCH) 100 UNIT/ML FlexTouch Pen Inject 0.3 mLs (30 Units total) into the skin daily.  . Insulin  Syringe-Needle U-100 (INSULIN SYRINGE .5CC/30GX1/2") 30G X 1/2" 0.5 ML MISC Use with glargine. Dispose after each use.  . Lancets (ONETOUCH ULTRASOFT) lancets Use to check blood glucose 2-3 times a day  . losartan (COZAAR) 50 MG tablet Take 1 tablet (50 mg total) by mouth at bedtime.  . metFORMIN (GLUCOPHAGE-XR) 500 MG 24 hr tablet Take 1 tablet (500 mg total) by mouth 2 (two) times daily with a meal.   No facility-administered encounter medications on file as of 03/05/2020.       Information about Care Management services was shared with Mr.  Nanna today including:  1. Care Management services include personalized support from designated clinical staff supervised by his physician, including individualized plan of care and coordination with other care providers 2. Remind patient of 24/7 contact phone numbers to provider's office for assistance with urgent and routine care needs. 3. Care Management services are voluntary and patient may stop at any time .  Patient agreed to services provided today and verbal consent obtained.     [name], [place] [facility] [place]  [facility]   [phone]

## 2020-03-06 NOTE — Patient Instructions (Signed)
Visit Information  Goals Addressed              This Visit's Progress   .  I don't understand about my diabetes (pt-stated)        CARE PLAN ENTRY (see longtitudinal plan of care for additional care plan information)  Objective:  Lab Results  Component Value Date   HGBA1C 10.4 (A) 02/21/2020 .   Lab Results  Component Value Date   CREATININE 0.68 10/15/2019   CREATININE 0.82 04/11/2019   CREATININE 0.87 03/26/2018 .   [name] No results found for: EGFR  Current Barriers:  [name] Knowledge Deficits related to basic Diabetes pathophysiology and self care/management . Difficulty obtaining or cannot afford medications . Does not have glucometer to monitor blood sugar  Case Manager Clinical Goal(s):  Over the next 90 days, patient will demonstrate improved adherence to prescribed treatment plan for diabetes self care/management as evidenced by: lower his a1c by 1-2 points . daily monitoring and recording of CBG   Interventions:  . Provided education to patient about basic DM disease process . Reviewed medications with patient and discussed importance of medication adherence . Discussed plans with patient for ongoing care management follow up and provided patient with direct contact information for care management team . Provided patient with written educational materials related to hypo and hyperglycemia and importance of correct treatment . Reviewed scheduled/upcoming provider appointments including: 03/04/20 appointment with [facility] in person for teaching about DM . Collaborated with LCSW [name] in regards to housing and food shortage . Talked with the patient about getting a glucometer to check his blood sugars.  He states that he does not have one he has spoke with his physician about getting one and RNCM has sent a message to see if there is one available.  . 03/04/20 . Patient was seen in person in the office for teaching .  He was given a One touch verio glucometer from the  office. By Marylen Ponto stated that his insurance should cover the test strips and lancets for his meter. RNCM set the meter up for him and showed him how to use and took his blood sugar while in the office  at 228 pm it was 216.  The patient stated that he had eaten a hamburger thiry minuets prior coming into the office. . The patient was given a diabetes packet which had a lot of information in it to read and a calendar for him to record his values.   . We discussed hypo and hyper glycemia and the handout were in the packet along with diet and exercise Checking FBS and post prandial.  I had the  patient to verbally say back to me what he had learned. . I reminded the patient that [name] will be contacting him about his need for housing. [name] RNCM will follow up with the patient in two weeks to review readings for his blood sugars.  Patient Self Care Activities:  . UNABLE to independently self manage diabetes  Initial goal documentation        Charles Fox was given information about Care Management services today including:  1. Care Management services include personalized support from designated clinical staff supervised by his physician, including individualized plan of care and coordination with other care providers 2. 24/7 contact phone numbers for assistance for urgent and routine care needs. 3. The patient may stop CCM services at any time (effective at the end of the month) by phone call  to the office staff.  Patient agreed to services and verbal consent obtained.   The patient verbalized understanding of instructions provided today and declined a print copy of patient instruction materials.   The care management team will reach out to the patient again over the next 14 days.  The patient has been provided with contact information for the care management team and has been advised to call with any health related questions or concerns.   [name] RN, BSN, [facility] Care  Management Coordinator [place] Phone: [phone] Fax: [phone]

## 2020-03-06 NOTE — Chronic Care Management (AMB) (Signed)
Care Management   Follow Up Note   03/06/2020 Name: Charles Fox MRN: [id number] DOB: 1951/04/22  Referred by: [name], MD Reason for referral : Care Coordination (Care Management RNCM DM II)   Charles Fox is a 69 y.o. year old male who is a primary care patient of [name], MD. The care management team was consulted for assistance with care management and care coordination needs.    Review of patient status, including review of consultants reports, relevant laboratory and other test results, and collaboration with appropriate care team members and the patient's provider was performed as part of comprehensive patient evaluation and provision of chronic care management services.    SDOH (Social Determinants of Health) assessments performed: No See Care Plan activities for detailed interventions related to [facility])     Advanced Directives: See Care Plan and Vynca application for related entries.   Goals Addressed              This Visit's Progress     I don't understand about my diabetes (pt-stated)        CARE PLAN ENTRY (see longtitudinal plan of care for additional care plan information)  Objective:  Lab Results  Component Value Date   HGBA1C 10.4 (A) 02/21/2020    Lab Results  Component Value Date   CREATININE 0.68 10/15/2019   CREATININE 0.82 04/11/2019   CREATININE 0.87 03/26/2018     No results found for: EGFR  Current Barriers:   Knowledge Deficits related to basic Diabetes pathophysiology and self care/management  Difficulty obtaining or cannot afford medications  Does not have glucometer to monitor blood sugar  Case Manager Clinical Goal(s):  Over the next 90 days, patient will demonstrate improved adherence to prescribed treatment plan for diabetes self care/management as evidenced by: lower his a1c by 1-2 points  daily monitoring and recording of CBG   Interventions:   Provided education to patient about basic DM disease  process  Reviewed medications with patient and discussed importance of medication adherence  Discussed plans with patient for ongoing care management follow up and provided patient with direct contact information for care management team  Provided patient with written educational materials related to hypo and hyperglycemia and importance of correct treatment  Reviewed scheduled/upcoming provider appointments including: 03/04/20 appointment with [facility] in person for teaching about DM  Collaborated with LCSW [name] in regards to housing and food shortage  Talked with the patient about getting a glucometer to check his blood sugars.  He states that he does not have one he has spoke with his physician about getting one and RNCM has sent a message to see if there is one available.   03/04/20  Patient was seen in person in the office for teaching .  He was given a One touch verio glucometer from the office. By Marylen Ponto stated that his insurance should cover the test strips and lancets for his meter. RNCM set the meter up for him and showed him how to use and took his blood sugar while in the office  at 228 pm it was 216.  The patient stated that he had eaten a hamburger thiry minuets prior coming into the office.  The patient was given a diabetes packet which had a lot of information in it to read and a calendar for him to record his values.    We discussed hypo and hyper glycemia and the handout were in the packet along with diet  and exercise Checking FBS and post prandial.  I had the  patient to verbally say back to me what he had learned.  I reminded the patient that [name] will be contacting him about his need for housing.  RNCM will follow up with the patient in two weeks to review readings for his blood sugars.  Patient Self Care Activities:   UNABLE to independently self manage diabetes  Initial goal documentation         The care management team will reach out  to the patient again over the next 14 days.  The patient has been provided with contact information for the care management team and has been advised to call with any health related questions or concerns.   [name] RN, BSN, [facility] Care Management Coordinator [place] Phone: [phone] Fax: [phone]

## 2020-03-10 ENCOUNTER — Other Ambulatory Visit: Payer: Self-pay

## 2020-03-10 ENCOUNTER — Ambulatory Visit: Payer: Medicare HMO | Admitting: Licensed Clinical Social Worker

## 2020-03-10 DIAGNOSIS — Z139 Encounter for screening, unspecified: Secondary | ICD-10-CM

## 2020-03-10 DIAGNOSIS — Z7189 Other specified counseling: Secondary | ICD-10-CM

## 2020-03-10 NOTE — Chronic Care Management (AMB) (Signed)
Care Management   Clinical Social Work Follow Up   03/10/2020 Name: PRAISE DOLECKI MRN: [id number] DOB: 03/07/1951 Referred by: [name], MD  Reason for referral : Chronic Care Management  LAYMON STOCKERT is a 69 y.o. year old male who is a primary care patient of [name], MD.  Reason for follow-up: assess for barriers and progress with chronic needs .   Assessment: Patient continues to experience difficulty with managing his diabetes. Reports he has not been sticking his finger and does not think he will have money to buy insulin. Patient has not been able to follow through on any of the goals discussed without running into further barriers. LCSW will continue to assess patient's understanding and ability to follow instructions. Patient reports he does not know what insurance and benefits he has.     Intervention: Collaborated with CCM RN on phone with patient via conference call.  To address concerns with not sticking finger and diabetes medication concerns. CCM RN provided instructions to patient on how to stick finger.  Plan:  1. Face to face appointment schedules with Traci tomorrow 2.   LCSW will F/U with patient in on week on goals discussed Advance Directive Status: not addressed during this encounter.  SDOH (Social Determinants of Health) assessments performed: Yes ; No needs identified  Goals Addressed            This Visit's Progress   . Affordable Housing   Not on track    [place] (see longitudinal plan of care for additional care plan information)  Current Barriers & Progress:  . Patient having difficulty affording current housing needs community resources for affordable housing options . Patient acknowledges deficits and needs support, education and care coordination in order to meet this unmet need  . Lacks knowledge of community resource: and where to start Clinical Goal(s)  . Over the next 30 days, patient will work with [place]  to  explore community housing options Interventions provided by LCSW:  . Assessment of needs and barriers to care as well as how impacting    . Discussed all housing options to include ALF ( patient not interested) . Provided phone number again to [facility] apartment  which is income based [phone]  . Made housing referral to [place] for additional options . Motivational Interviewing  Patient Self Care Activities & Deficits:  . Patient is unable to independently navigate community resource options without care coordination support  . Patient is motivated to resolve concern  . Patient states he will call Pey[facility] as discussed today Please see past updates related to this goal by clicking on the "Past Updates" button in the selected goal     . Need My teeth Fixed   Not on track    [place] (see longitudinal plan of care for additional care plan information)  Current Barriers & Progress:  . Patient with dental pain needs go to a dentist,  . Acknowledges deficits and needs support, education and care coordination in order to meet this unmet need  . Patient still unable to determine his current insurance and benefits Clinical Goal(s):  Over the next 30 days, patient will address his unmet dental need and be able to eat and be free of pain  Interventions: . Assessed patient needs, what they have used in the past and how currently meeting needs . Collaborated with CCM RN regarding patient needs  . on phone with  patient trying to identify insurance and benefits for dental. Provided patient with phone number to call Bernadene Person [phone] Patient Self Care Activities: . Patient is unable to independently navigate getting dental needs met without care coordination support  . Patient will call [facility] Falling to verify insurance  Please see past updates related to this goal by clicking on the "Past Updates" button in the selected goal         Outpatient Encounter  Medications as of 03/10/2020  Medication Sig  . Alcohol Swabs PADS He is to check blood glucose 2-3 times a day  . aspirin 81 MG tablet Take 1 tablet (81 mg total) by mouth daily.  [name] atorvastatin (LIPITOR) 40 MG tablet Take 1 tablet (40 mg total) by mouth daily.  . blood glucose meter kit and supplies KIT Dispense based on patient and insurance preference. Use up to four times daily as directed. (FOR ICD-9 250.00, 250.01). (Patient not taking: Reported on 03/04/2020)  . Blood Glucose Monitoring Suppl (ONETOUCH VERIO) w/Device KIT by Does not apply route.  . insulin degludec (TRESIBA FLEXTOUCH) 100 UNIT/ML FlexTouch Pen Inject 0.3 mLs (30 Units total) into the skin daily.  . Insulin Syringe-Needle U-100 (INSULIN SYRINGE .5CC/30GX1/2") 30G X 1/2" 0.5 ML MISC Use with glargine. Dispose after each use.  . Lancets (ONETOUCH ULTRASOFT) lancets Use to check blood glucose 2-3 times a day  . losartan (COZAAR) 50 MG tablet Take 1 tablet (50 mg total) by mouth at bedtime.  . metFORMIN (GLUCOPHAGE-XR) 500 MG 24 hr tablet Take 1 tablet (500 mg total) by mouth 2 (two) times daily with a meal.   No facility-administered encounter medications on file as of 03/10/2020.   Review of patient status, including review of consultants reports, relevant laboratory and other test results, and collaboration with appropriate care team members and the patient's provider was performed as part of comprehensive patient evaluation and provision of care management services.    [name], [place] / [place]   [phone] 1:30 PM

## 2020-03-11 ENCOUNTER — Ambulatory Visit: Payer: Medicare HMO

## 2020-03-11 ENCOUNTER — Other Ambulatory Visit: Payer: Self-pay | Admitting: Family Medicine

## 2020-03-11 ENCOUNTER — Other Ambulatory Visit: Payer: Self-pay

## 2020-03-11 MED ORDER — ONETOUCH DELICA LANCETS 33G MISC: 11 refills | Status: DC

## 2020-03-11 MED ORDER — ONETOUCH VERIO VI STRP: ORAL_STRIP | 12 refills | Status: DC

## 2020-03-12 ENCOUNTER — Encounter: Payer: Self-pay | Admitting: Pharmacist

## 2020-03-12 NOTE — Progress Notes (Signed)
Asked by [name], RN of the CCM team to assist with [facility] insulin supply.   Patient was planning to pick up insulin on 03/11/2020 However, he left the building prior to insulin samples being provided due to a front office mis-communication.   He was contacted and plans to pick up insulin supply 6/24 Medication Samples have been provided for the patient to pick up (placed in refrigerator)  Drug name: [name] (insulin degludec)       Strength: 100units/ml        Qty: 3 pens   LOT: [id number]  Exp.Date: 03/18/2021  Drug name: [name] (insulin degludec)       Strength: 100 units/ml        Qty: 1 pen  LOT: [id number]  Exp.Date: 08/18/2021  Dosing instructions: As previous - 30 units once daily  The patient has been instructed regarding the correct time, dose, and frequency of taking this medication, including desired effects and most common side effects.   [name] 9:20 AM 03/12/2020

## 2020-03-13 ENCOUNTER — Telehealth: Payer: Self-pay | Admitting: *Deleted

## 2020-03-13 NOTE — Telephone Encounter (Addendum)
Received fax about pts Rx for lancets and test strips. Both faxes says How many times are you checking blood sugar. Please update and resend to pharmacy.Lily Velasquez Zimmerman Rumple, CMA

## 2020-03-16 MED FILL — ONETOUCH DELICA PLUS LANCET: 33 days supply | Qty: 100 | Fill #0

## 2020-03-16 MED FILL — ONETOUCH VERIO TEST STRIP: 33 days supply | Qty: 100 | Fill #0

## 2020-03-17 MED ORDER — ONETOUCH DELICA LANCETS 33G MISC: 11 refills | Status: DC

## 2020-03-17 MED ORDER — ONETOUCH VERIO VI STRP: ORAL_STRIP | 12 refills | Status: DC

## 2020-03-18 ENCOUNTER — Other Ambulatory Visit: Payer: Self-pay

## 2020-03-18 ENCOUNTER — Ambulatory Visit: Payer: Medicare HMO | Admitting: Licensed Clinical Social Worker

## 2020-03-18 DIAGNOSIS — Z789 Other specified health status: Secondary | ICD-10-CM

## 2020-03-18 DIAGNOSIS — Z7189 Other specified counseling: Secondary | ICD-10-CM

## 2020-03-18 NOTE — Chronic Care Management (AMB) (Signed)
Care Management   Clinical Social Work Follow Up   03/18/2020 Name: Charles Fox MRN: [id number] DOB: [birth year] Referred by: [name], MD  Reason for referral : Care Coordination (follow up call)  Charles Fox is a 69 y.o. year old male who is a primary care patient of [name], MD.  Reason for follow-up: assess for barriers and progress with community resources .   Assessment: Patient continues to experience difficulty with connecting to resources to accomplish goals. He doe not desire to have F/U from LCSW. States he will call office if needed.  Reports he checked is blood sugar prior to eating and it was low.  Does not know the number. Colloborated with CCM RN and shared information.   Plan:  1. Patient will F/U on information discussed 2.  Does not desire additional F/U from LCSW.  No F/U scheduled.   3. Will continue to collaborate with CCM RN for ongoing needs Advance Directive Status: not addressed during this encounter.  SDOH (Social Determinants of Health) assessments performed: Yes ; No needs identified   Goals Addressed            This Visit's Progress   . Affordable Housing       CARE PLAN ENTRY (see longitudinal plan of care for additional care plan information)  Current Barriers & Progress:  . Patient having difficulty affording current housing needs community resources for affordable housing options . Patient acknowledges deficits and needs support, education and care coordination in order to meet this unmet need  . Lacks knowledge of community resource: and where to start . Patient states he contacted [facility] apartment  [phone] and they do not ha Clinical Goal(s)  . Over the next 30 days, patient will work with [place]  to explore community housing options Interventions provided by LCSW:  . Assessment of needs and barriers to care as well as how impacting    . Discussed all housing options to include ALF ( patient not  interested) . Made housing referral to [place] for additional options . Motivational Interviewing  Patient Self Care Activities & Deficits:  . Patient is unable to independently navigate community resource options without care coordination support  . Patient states he no longer wants to move will stay in current housing Please see past updates related to this goal by clicking on the "Past Updates" button in the selected goal     . Need My teeth Fixed   Not on track    [place] (see longitudinal plan of care for additional care plan information)  Current Barriers & Progress:  . Patient with dental pain needs go to a dentist,  . Acknowledges deficits and needs support, education and care coordination in order to meet this unmet need  . Patient reports he received insurance information in the mail, will call about his teeth Clinical Goal(s):  Over the next 30 days, patient will address his unmet dental need and be able to eat and be free of pain  Interventions: . Assessed patient needs, what they have used in the past and how currently meeting needs . Collaborated with CCM RN regarding patient needs  . Provided patient with phone number to call [facility] [phone] Patient Self Care Activities: . Patient is unable to independently navigate getting dental needs met without care coordination support  . Patient will call [facility] Falling to verify insurance and benefits  Please see past updates related to this  goal by clicking on the "Past Updates" button in the selected goal       . COMPLETED: [place] (see longitudinal plan of care for additional care plan information)  Current Barriers:  . Patient with food insecurities needs community resources,  . Patient acknowledges deficits and needs support, education and care coordination in order to meet this unmet need  . Lost food stamp card ( waiting for new card) . Patient received new food stamp  card in the mail and has gone to the store, He doe not want to participate in the [facility] foodbox program at [facility] Clinical Goal(s)  . Over the next 30 days patient will be able to have healthy food as demonstrated by improved health Interventions provided by LCSW:  . Assessment of needs and barriers to care, what patient is currently doing as well as how impacting him Patient Self Care Activities & Deficits:  . Patient is unable to independently navigate community resource options without care coordination support  . Patient is able to go to the store with foodstamp card  Please see past updates related to this goal by clicking on the "Past Updates" button in the selected goal        Outpatient Encounter Medications as of 03/18/2020  Medication Sig  . Alcohol Swabs PADS He is to check blood glucose 2-3 times a day  . aspirin 81 MG tablet Take 1 tablet (81 mg total) by mouth daily.  [name] atorvastatin (LIPITOR) 40 MG tablet Take 1 tablet (40 mg total) by mouth daily.  . blood glucose meter kit and supplies KIT Dispense based on patient and insurance preference. Use up to four times daily as directed. (FOR ICD-9 250.00, 250.01). (Patient not taking: Reported on 03/04/2020)  . Blood Glucose Monitoring Suppl (ONETOUCH VERIO) w/Device KIT by Does not apply route.  [name] glucose blood (ONETOUCH VERIO) test strip Please use to check blood glucose once in the AM while fasting prior to giving insulin.  [name] insulin degludec (TRESIBA FLEXTOUCH) 100 UNIT/ML FlexTouch Pen Inject 0.3 mLs (30 Units total) into the skin daily.  . Insulin Syringe-Needle U-100 (INSULIN SYRINGE .5CC/30GX1/2") 30G X 1/2" 0.5 ML MISC Use with glargine. Dispose after each use.  . losartan (COZAAR) 50 MG tablet Take 1 tablet (50 mg total) by mouth at bedtime.  . metFORMIN (GLUCOPHAGE-XR) 500 MG 24 hr tablet Take 1 tablet (500 mg total) by mouth 2 (two) times daily with a meal.  . OneTouch Delica Lancets 17O MISC Please use to check  blood glucose once in the AM while fasting prior to giving insulin.   No facility-administered encounter medications on file as of 03/18/2020.    Review of patient status, including review of consultants reports, relevant laboratory and other test results, and collaboration with appropriate care team members and the patient's provider was performed as part of comprehensive patient evaluation and provision of care management services.  [name], [place] / [place]   [phone] 4:22 PM

## 2020-03-18 NOTE — Patient Instructions (Signed)
Visit Information  Goals Addressed              This Visit's Progress     I don't understand about my diabetes (pt-stated)        CARE PLAN ENTRY (see longtitudinal plan of care for additional care plan information)  Objective:  Lab Results  Component Value Date   HGBA1C 10.4 (A) 02/21/2020    Lab Results  Component Value Date   CREATININE 0.68 10/15/2019   CREATININE 0.82 04/11/2019   CREATININE 0.87 03/26/2018     No results found for: EGFR  Current Barriers:   Knowledge Deficits related to basic Diabetes pathophysiology and self care/management  Difficulty obtaining or cannot afford medications  Does not have glucometer to monitor blood sugar  Case Manager Clinical Goal(s):  Over the next 90 days, patient will demonstrate improved adherence to prescribed treatment plan for diabetes self care/management as evidenced by: lower his a1c by 1-2 points  daily monitoring and recording of CBG   Interventions:   Provided education to patient about basic DM disease process  Reviewed medications with patient and discussed importance of medication adherence  Discussed plans with patient for ongoing care management follow up and provided patient with direct contact information for care management team  Provided patient with written educational materials related to hypo and hyperglycemia and importance of correct treatment  Reviewed scheduled/upcoming provider appointments including: 03/04/20 appointment with [facility] in person for teaching about DM  Collaborated with LCSW Charles Fox in regards to housing and food shortage  Talked with the patient about getting a glucometer to check his blood sugars.  He states that he does not have one he has spoke with his physician about getting one and RNCM has sent a message to see if there is one available.   03/11/20  Spoke with the patient on the phone and he states he was able to do his finger stick his blood sugar was 216.  He  is still having difficulty remembering to write his values down.  Patient was told to come up to the office to pick up insulin samples  that are available for him  The patient states that he is getting some exercise by walking around were he lives  The patient states that he is not eating a lot of hard foods his tooth was loose and fell out.  Advised the patient to eat soft food.  Gave him some examples.  The patient states that he checked on Charles Fox and housing on [name].  He waiting for the to return his call.  The patient states that he still smokes cigarettes.  He said that he smokes a a pack a day and has smoked since he was 69 years old. We will talk about smoking cessation at next talk.  Patient Self Care Activities:   UNABLE to independently self manage diabetes  Please see past updates related to this goal by clicking on the "Past Updates" button in the selected goal         Charles Fox was given information about Care Management services today including:  1. Care Management services include personalized support from designated clinical staff supervised by his physician, including individualized plan of care and coordination with other care providers 2. 24/7 contact phone numbers for assistance for urgent and routine care needs. 3. The patient may stop CCM services at any time (effective at the end of the month) by phone call to the office staff.  Patient agreed to services and verbal consent obtained.   The patient verbalized understanding of instructions provided today and declined a print copy of patient instruction materials.   The care management team will reach out to the patient again over the next 14 days.  The patient has been provided with contact information for the care management team and has been advised to call with any health related questions or concerns.   Charles Arms RN, BSN, [facility] Care Management Coordinator [place] Phone: [phone] Fax: [phone]

## 2020-03-18 NOTE — Chronic Care Management (AMB) (Signed)
Care Management   Follow Up Note   03/18/2020 Name: Charles Fox MRN: [id number] DOB: 1950-11-25  Referred by: [name], MD Reason for referral : Care Coordination (Care Management RNCM DM II)   Charles Fox is a 69 y.o. year old male who is a primary care patient of [name], MD. The care management team was consulted for assistance with care management and care coordination needs.    Review of patient status, including review of consultants reports, relevant laboratory and other test results, and collaboration with appropriate care team members and the patient's provider was performed as part of comprehensive patient evaluation and provision of chronic care management services.    SDOH (Social Determinants of Health) assessments performed: No See Care Plan activities for detailed interventions related to [facility])     Advanced Directives: See Care Plan and Vynca application for related entries.   Goals Addressed              This Visit's Progress     I don't understand about my diabetes (pt-stated)        CARE PLAN ENTRY (see longtitudinal plan of care for additional care plan information)  Objective:  Lab Results  Component Value Date   HGBA1C 10.4 (A) 02/21/2020    Lab Results  Component Value Date   CREATININE 0.68 10/15/2019   CREATININE 0.82 04/11/2019   CREATININE 0.87 03/26/2018     No results found for: EGFR  Current Barriers:   Knowledge Deficits related to basic Diabetes pathophysiology and self care/management  Difficulty obtaining or cannot afford medications  Does not have glucometer to monitor blood sugar  Case Manager Clinical Goal(s):  Over the next 90 days, patient will demonstrate improved adherence to prescribed treatment plan for diabetes self care/management as evidenced by: lower his a1c by 1-2 points  daily monitoring and recording of CBG   Interventions:   Provided education to patient about basic DM disease  process  Reviewed medications with patient and discussed importance of medication adherence  Discussed plans with patient for ongoing care management follow up and provided patient with direct contact information for care management team  Provided patient with written educational materials related to hypo and hyperglycemia and importance of correct treatment  Reviewed scheduled/upcoming provider appointments including: 03/04/20 appointment with [facility] in person for teaching about DM  Collaborated with LCSW [name] in regards to housing and food shortage  Talked with the patient about getting a glucometer to check his blood sugars.  He states that he does not have one he has spoke with his physician about getting one and RNCM has sent a message to see if there is one available.   03/11/20  Spoke with the patient on the phone and he states he was able to do his finger stick his blood sugar was 216.  He is still having difficulty remembering to write his values down.  Patient was told to come up to the office to pick up insulin samples  that are available for him  The patient states that he is getting some exercise by walking around were he lives  The patient states that he is not eating a lot of hard foods his tooth was loose and fell out.  Advised the patient to eat soft food.  Gave him some examples.  The patient states that he checked on [place] and housing on [address].  He waiting for the to return his call.  The  patient states that he still smokes cigarettes.  He said that he smokes a a pack a day and has smoked since he was 69 years old. We will talk about smoking cessation at next talk.  Patient Self Care Activities:   UNABLE to independently self manage diabetes  Please see past updates related to this goal by clicking on the "Past Updates" button in the selected goal          The care management team will reach out to the patient again over the next 14  days.  The patient has been provided with contact information for the care management team and has been advised to call with any health related questions or concerns.   [name] RN, BSN, [facility] Care Management Coordinator [place] Phone: [phone] Fax: [phone]

## 2020-03-24 MED FILL — ATORVASTATIN 40 MG TABLET: 40 | 30 days supply | Qty: 30 | Fill #0

## 2020-03-26 ENCOUNTER — Ambulatory Visit: Payer: Medicare HMO

## 2020-03-26 ENCOUNTER — Ambulatory Visit: Payer: Self-pay

## 2020-03-26 ENCOUNTER — Other Ambulatory Visit: Payer: Self-pay

## 2020-03-26 NOTE — Chronic Care Management (AMB) (Signed)
Care Management   Follow Up Note   03/26/2020 Name: Charles Fox MRN: [id number] DOB: 07-Dec-1950  Referred by: [name], MD Reason for referral : Care Coordination (Care Management RNCM DM II)   Charles Fox is a 68 y.o. year old male who is a primary care patient of [name], MD. The care management team was consulted for assistance with care management and care coordination needs.    Review of patient status, including review of consultants reports, relevant laboratory and other test results, and collaboration with appropriate care team members and the patient's provider was performed as part of comprehensive patient evaluation and provision of chronic care management services.    SDOH (Social Determinants of Health) assessments performed: No See Care Plan activities for detailed interventions [facility] to [facility])     Advanced Directives: See Care Plan and Vynca application for related entries.   Goals Addressed              This Visit's Progress     I don't understand about my diabetes (pt-stated)        CARE PLAN ENTRY (see longtitudinal plan of care for additional care plan information)  Objective:  Lab Results  Component Value Date   HGBA1C 10.4 (A) 02/21/2020    Lab Results  Component Value Date   CREATININE 0.68 10/15/2019   CREATININE 0.82 04/11/2019   CREATININE 0.87 03/26/2018     No results found for: EGFR  Current Barriers:   Knowledge Deficits related to basic Diabetes pathophysiology and self care/management  Difficulty obtaining or cannot afford medications  Does not have glucometer to monitor blood sugar  Case Manager Clinical Goal(s):  Over the next 90 days, patient will demonstrate improved adherence to prescribed treatment plan for diabetes self care/management as evidenced by: lower his a1c by 1-2 points  daily monitoring and recording of CBG   Interventions:   Provided education to patient about basic DM disease  process  Reviewed medications with patient and discussed importance of medication adherence  Discussed plans with patient for ongoing care management follow up and provided patient with direct contact information for care management team  Provided patient with written educational materials related to hypo and hyperglycemia and importance of correct treatment  Reviewed scheduled/upcoming provider appointments including: 03/04/20 appointment with [facility] in person for teaching about DM  Collaborated with LCSW [name] in regards to housing and food shortage  Talked with the patient about getting a glucometer to check his blood sugars.  He states that he does not have one he has spoke with his physician about getting one and RNCM has sent a message to see if there is one available.   03/26/20  Patient called back and stated that he is checking his blood sugars daily and his blood sugar yesterday was 180  He is walking daily  He states that he does not have much of an appetite  He is taking his meds as prescribed.  Patient states that his mouth still hurts.  RNCM gave the patient  the number to [place] on his voice mail per request that LCSW [name]  had given the patient [facility] [phone].  The patient stated that he had not called.  Patient Self Care Activities:   UNABLE to independently self manage diabetes  Please see past updates related to this goal by clicking on the "Past Updates" button in the selected goal  The care management team will reach out to the patient again over the next 14 days.  The patient has been provided with contact information for the care management team and has been advised to call with any health related questions or concerns.   [name] RN, BSN, [facility] Care Management Coordinator [place] Phone: [phone] Fax: [phone]

## 2020-03-26 NOTE — Chronic Care Management (AMB) (Signed)
°  Care Management   Outreach Note  03/26/2020 Name: Charles Fox MRN: [id number] DOB: August 12, 1951  Referred by: [name], MD Reason for referral : Care Coordination (Care Management RNCM DM II)   An unsuccessful telephone outreach was attempted today. The patient was referred to the case management team for assistance with care management and care coordination.   Follow Up Plan: A HIPPA compliant phone message was left for the patient providing contact information and requesting a return call.  The care management team will reach out to the patient again over the next 7-14 days.   [name] RN, BSN, [facility] Care Management Coordinator [place] Phone: [phone] Fax: [phone]

## 2020-03-26 NOTE — Patient Instructions (Signed)
Visit Information  Goals Addressed              This Visit's Progress   .  I don't understand about my diabetes (pt-stated)        CARE PLAN ENTRY (see longtitudinal plan of care for additional care plan information)  Objective:  Lab Results  Component Value Date   HGBA1C 10.4 (A) 02/21/2020 .   Lab Results  Component Value Date   CREATININE 0.68 10/15/2019   CREATININE 0.82 04/11/2019   CREATININE 0.87 03/26/2018 .   [name] No results found for: EGFR  Current Barriers:  [name] Knowledge Deficits related to basic Diabetes pathophysiology and self care/management . Difficulty obtaining or cannot afford medications . Does not have glucometer to monitor blood sugar  Case Manager Clinical Goal(s):  Over the next 90 days, patient will demonstrate improved adherence to prescribed treatment plan for diabetes self care/management as evidenced by: lower his a1c by 1-2 points . daily monitoring and recording of CBG   Interventions:  . Provided education to patient about basic DM disease process . Reviewed medications with patient and discussed importance of medication adherence . Discussed plans with patient for ongoing care management follow up and provided patient with direct contact information for care management team . Provided patient with written educational materials related to hypo and hyperglycemia and importance of correct treatment . Reviewed scheduled/upcoming provider appointments including: 03/04/20 appointment with [facility] in person for teaching about DM . Collaborated with LCSW [name] in regards to housing and food shortage . Talked with the patient about getting a glucometer to check his blood sugars.  He states that he does not have one he has spoke with his physician about getting one and RNCM has sent a message to see if there is one available.  . 03/26/20 . Patient called back and stated that he is checking his blood sugars daily and his blood sugar yesterday was  180 . He is walking daily . He states that he does not have much of an appetite . He is taking his meds as prescribed. . Patient states that his mouth still hurts.  RNCM gave the patient  the number to [place] on his voice mail per request that LCSW [name]  had given the patient [facility] [phone].  The patient stated that he had not called.  Patient Self Care Activities:  . UNABLE to independently self manage diabetes  Please see past updates related to this goal by clicking on the "Past Updates" button in the selected goal         Charles Fox was given information about Care Management services today including:  1. Care Management services include personalized support from designated clinical staff supervised by his physician, including individualized plan of care and coordination with other care providers 2. 24/7 contact phone numbers for assistance for urgent and routine care needs. 3. The patient may stop CCM services at any time (effective at the end of the month) by phone call to the office staff.  Patient agreed to services and verbal consent obtained.   The patient verbalized understanding of instructions provided today and declined a print copy of patient instruction materials.   The care management team will reach out to the patient again over the next 14 days.  The patient has been provided with contact information for the care management team and has been advised to call with any health related questions or concerns.   [name] RN, BSN,  [facility] Care Management Coordinator [place] Phone: [phone] Fax: [phone]

## 2020-03-30 ENCOUNTER — Telehealth: Payer: Medicare HMO

## 2020-04-10 ENCOUNTER — Ambulatory Visit: Payer: Medicare HMO

## 2020-04-10 ENCOUNTER — Other Ambulatory Visit: Payer: Self-pay

## 2020-04-10 NOTE — Patient Instructions (Signed)
Visit Information  Goals Addressed              This Visit's Progress   .  I don't understand about my diabetes (pt-stated)        CARE PLAN ENTRY (see longtitudinal plan of care for additional care plan information)  Objective:  Lab Results  Component Value Date   HGBA1C 10.4 (A) 02/21/2020 .   Lab Results  Component Value Date   CREATININE 0.68 10/15/2019   CREATININE 0.82 04/11/2019   CREATININE 0.87 03/26/2018 .   [name] No results found for: EGFR  Current Barriers:  [name] Knowledge Deficits related to basic Diabetes pathophysiology and self care/management . Difficulty obtaining or cannot afford medications . Does not have glucometer to monitor blood sugar  Case Manager Clinical Goal(s):  Over the next 90 days, patient will demonstrate improved adherence to prescribed treatment plan for diabetes self care/management as evidenced by: lower his a1c by 1-2 points . daily monitoring and recording of CBG   Interventions:  . Provided education to patient about basic DM disease process . Reviewed medications with patient and discussed importance of medication adherence . Discussed plans with patient for ongoing care management follow up and provided patient with direct contact information for care management team . Provided patient with written educational materials related to hypo and hyperglycemia and importance of correct treatment . Reviewed scheduled/upcoming provider appointments including: 03/04/20 appointment with [facility] in person for teaching about DM . Collaborated with LCSW [name] in regards to housing and food shortage . Talked with the patient about getting a glucometer to check his blood sugars.  He states that he does not have one he has spoke with his physician about getting one and RNCM has sent a message to see if there is one available.  . 04/10/20 . Spoke with the patient today and he states that he is checking his blood sugars daily but when asked for values  he is states that they are good.  I asked him to give me an exact number and he stated 175. . I explained to him the reason that I need to know his exact number so that we can work together to bring his numbers down.  He verbalized understanding and stated that he will have values for me the next time that I call.  [name] He does state that he is still trying to stay active by walking.  He states that it gets lonely in his home.  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 [name] He stated that he went to housing authority  and they put him  on a waiting list for housing. [name] He stated that he still has not call his insurance company to find out a list of dentist in area.  He states that he will call them soon.                    Patient Self Care Activities:  . UNABLE to independently self manage diabetes  Please see past updates related to this goal by clicking on the "Past Updates" button in the selected goal         Charles Fox was given information about Care Management services today including:  1. Care Management services include personalized support from designated clinical staff supervised by his physician, including individualized plan of care and coordination with other care providers 2. 24/7 contact phone numbers  for assistance for urgent and routine care needs. 3. The patient may stop CCM services at any time (effective at the end of the month) by phone call to the office staff.  Patient agreed to services and verbal consent obtained.   The patient verbalized understanding of instructions provided today and declined a print copy of patient instruction materials.   The care management team will reach out to the patient again over the next 14 days.   [name] RN, BSN, [facility] Care Management Coordinator [place] Phone: [phone] Fax: [phone]

## 2020-04-10 NOTE — Chronic Care Management (AMB) (Signed)
Care Management   Follow Up Note   04/10/2020 Name: Charles Fox MRN: [id number] DOB: 1951/02/18  Referred by: Charles Kitty, MD Reason for referral : Chronic Care Management (DM II)   Charles Fox is a 69 y.o. year old male who is a primary care patient of Charles Kitty, MD. The care management team was consulted for assistance with care management and care coordination needs.    Review of patient status, including review of consultants reports, relevant laboratory and other test results, and collaboration with appropriate care team members and the patient's provider was performed as part of comprehensive patient evaluation and provision of chronic care management services.    SDOH (Social Determinants of Health) assessments performed: No See Care Plan activities for detailed interventions related to [facility])     Advanced Directives: See Care Plan and Vynca application for related entries.   Goals Addressed              This Visit's Progress   .  I don't understand about my diabetes (pt-stated)        CARE PLAN ENTRY (see longtitudinal plan of care for additional care plan information)  Objective:  Lab Results  Component Value Date   HGBA1C 10.4 (A) 02/21/2020 .   Lab Results  Component Value Date   CREATININE 0.68 10/15/2019   CREATININE 0.82 04/11/2019   CREATININE 0.87 03/26/2018 .   Charles Fox No results found for: EGFR  Current Barriers:  Charles Fox Knowledge Deficits related to basic Diabetes pathophysiology and self care/management . Difficulty obtaining or cannot afford medications . Does not have glucometer to monitor blood sugar  Case Manager Clinical Goal(s):  Over the next 90 days, patient will demonstrate improved adherence to prescribed treatment plan for diabetes self care/management as evidenced by: lower his a1c by 1-2 points . daily monitoring and recording of CBG   Interventions:  . Provided education to patient about basic DM disease  process . Reviewed medications with patient and discussed importance of medication adherence . Discussed plans with patient for ongoing care management follow up and provided patient with direct contact information for care management team . Provided patient with written educational materials related to hypo and hyperglycemia and importance of correct treatment . Reviewed scheduled/upcoming provider appointments including: 03/04/20 appointment with [facility] in person for teaching about DM . Collaborated with LCSW Sammuel Hines in regards to housing and food shortage . Talked with the patient about getting a glucometer to check his blood sugars.  He states that he does not have one he has spoke with his physician about getting one and RNCM has sent a message to see if there is one available.  . 04/10/20 . Spoke with the patient today and he states that he is checking his blood sugars daily but when asked for values he is states that they are good.  I asked him to give me an exact number and he stated 175. . I explained to him the reason that I need to know his exact number so that we can work together to bring his numbers down.  He verbalized understanding and stated that he will have values for me the next time that I call.  Charles Fox He does state that he is still trying to stay active by walking.  He states that it gets lonely in his home.  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 Charles Fox He  stated that he went to housing authority  and they put him  on a waiting list for housing. Charles Fox He stated that he still has not call his insurance company to find out a list of dentist in area.  He states that he will call them soon.                    Patient Self Care Activities:  . UNABLE to independently self manage diabetes  Please see past updates related to this goal by clicking on the "Past Updates" button in the selected goal          The care management  team will reach out to the patient again over the next 14 days.   [name] RN, BSN, [facility] Care Management Coordinator [place] Phone: [phone] Fax: [phone]

## 2020-04-17 ENCOUNTER — Other Ambulatory Visit: Payer: Self-pay

## 2020-04-17 DIAGNOSIS — I10 Essential (primary) hypertension: Secondary | ICD-10-CM

## 2020-04-17 DIAGNOSIS — Z794 Long term (current) use of insulin: Secondary | ICD-10-CM

## 2020-04-17 MED ORDER — ATORVASTATIN CALCIUM 40 MG PO TABS: 40.0000 mg | ORAL_TABLET | Freq: Every day | ORAL | 3 refills | Status: DC

## 2020-04-17 MED ORDER — TRESIBA FLEXTOUCH 100 UNIT/ML ~~LOC~~ SOPN: 30.0000 [IU] | PEN_INJECTOR | Freq: Every day | SUBCUTANEOUS | 11 refills | Status: DC

## 2020-04-17 MED FILL — BASAGLAR 100 UNIT/ML KWIKPE: 100 | 30 days supply | Qty: 9 | Fill #0

## 2020-04-17 MED FILL — ATORVASTATIN 40 MG TABLET: 40 | 30 days supply | Qty: 30 | Fill #1

## 2020-04-24 ENCOUNTER — Ambulatory Visit: Payer: Medicare HMO

## 2020-04-24 ENCOUNTER — Other Ambulatory Visit: Payer: Self-pay

## 2020-04-27 NOTE — Patient Instructions (Signed)
Visit Information  Goals Addressed              This Visit's Progress   .  I don't understand about my diabetes (pt-stated)        CARE PLAN ENTRY (see longtitudinal plan of care for additional care plan information)  Objective:  Lab Results  Component Value Date   HGBA1C 10.4 (A) 02/21/2020 .   Lab Results  Component Value Date   CREATININE 0.68 10/15/2019   CREATININE 0.82 04/11/2019   CREATININE 0.87 03/26/2018 .   Charles Fox No results found for: EGFR  Current Barriers:  Charles Fox Knowledge Deficits related to basic Diabetes pathophysiology and self care/management . Difficulty obtaining or cannot afford medications . Does not have glucometer to monitor blood sugar  Case Manager Clinical Goal(s):  Over the next 90 days, patient will demonstrate improved adherence to prescribed treatment plan for diabetes self care/management as evidenced by: lower his a1c by 1-2 points . daily monitoring and recording of CBG   Interventions:  . Provided education to patient about basic DM disease process . Reviewed medications with patient and discussed importance of medication adherence . Discussed plans with patient for ongoing care management follow up and provided patient with direct contact information for care management team . Provided patient with written educational materials related to hypo and hyperglycemia and importance of correct treatment . Reviewed scheduled/upcoming provider appointments including: 03/04/20 appointment with [facility] in person for teaching about DM . Collaborated with LCSW [name] in regards to housing and food shortage . Talked with the patient about getting a glucometer to check his blood sugars.  He states that he does not have one he has spoke with his physician about getting one and RNCM has sent a message to see if there is one available.  . 04/24/20 . Spoke with the patient today and he states that he is checking his blood sugars daily but when asked for values he  is states that they are good.  I asked him to give me an exact number and he stated 150 . I explained to him the reason that I need to know his exact number so that we can work together to bring his numbers down.  He verbalized understanding and stated that he will have values for me the next time that I call.  Today though he is still not writing the numbers down stating that he hdoes not have anything to write with.  I asked the patient does he want to continue to work with me to help him and he stated that he does.  So I explained  that he has to help me  by working at home on his blood sugars.  He verbalized understanding. Charles Fox He does state that he is still trying to stay active by walking.  He states that it gets lonely in his home.  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 Charles Fox He stated that he is still on the list for housing authority and has not heard anything else . He has not call his insurance company to find out a list of dentist in area.  He states that he will call.  He also stated that he wants to find an "eye doctor"    He stated that the doctor was on located on [address]. I looked up Dr Lucita Ferrara for him and gave him the number to call for an  appointment.  He asked me to call and leave the information on his voicemail.                  Patient Self Care Activities:  . UNABLE to independently self manage diabetes  Please see past updates related to this goal by clicking on the "Past Updates" button in the selected goal         [name] was given information about Care Management services today including:  1. Care Management services include personalized support from designated clinical staff supervised by his physician, including individualized plan of care and coordination with other care providers 2. 24/7 contact phone numbers for assistance for urgent and routine care needs. 3. The patient may stop CCM  services at any time (effective at the end of the month) by phone call to the office staff.  Patient agreed to services and verbal consent obtained.   The patient verbalized understanding of instructions provided today and declined a print copy of patient instruction materials.   The care management team will reach out to the patient again over the next 14 days.   [name] RN, BSN, [facility] Care Management Coordinator [place] Phone: [phone] Fax: [phone]

## 2020-04-27 NOTE — Chronic Care Management (AMB) (Signed)
Care Management   Follow Up Note   04/27/2020 Name: Charles Fox MRN: [id number] DOB: October 03, 1950  Referred by: [name], MD Reason for referral : Chronic Care Management (DM II)   Charles Fox is a 69 y.o. year old male who is a primary care patient of [name], MD. The care management team was consulted for assistance with care management and care coordination needs.    Review of patient status, including review of consultants reports, relevant laboratory and other test results, and collaboration with appropriate care team members and the patient's provider was performed as part of comprehensive patient evaluation and provision of chronic care management services.    SDOH (Social Determinants of Health) assessments performed: No See Care Plan activities for detailed interventions related to [facility])     Advanced Directives: See Care Plan and Vynca application for related entries.   Goals Addressed              This Visit's Progress   .  I don't understand about my diabetes (pt-stated)        CARE PLAN ENTRY (see longtitudinal plan of care for additional care plan information)  Objective:  Lab Results  Component Value Date   HGBA1C 10.4 (A) 02/21/2020 .   Lab Results  Component Value Date   CREATININE 0.68 10/15/2019   CREATININE 0.82 04/11/2019   CREATININE 0.87 03/26/2018 .   [name] No results found for: EGFR  Current Barriers:  [name] Knowledge Deficits related to basic Diabetes pathophysiology and self care/management . Difficulty obtaining or cannot afford medications . Does not have glucometer to monitor blood sugar  Case Manager Clinical Goal(s):  Over the next 90 days, patient will demonstrate improved adherence to prescribed treatment plan for diabetes self care/management as evidenced by: lower his a1c by 1-2 points . daily monitoring and recording of CBG   Interventions:  . Provided education to patient about basic DM disease  process . Reviewed medications with patient and discussed importance of medication adherence . Discussed plans with patient for ongoing care management follow up and provided patient with direct contact information for care management team . Provided patient with written educational materials related to hypo and hyperglycemia and importance of correct treatment . Reviewed scheduled/upcoming provider appointments including: 03/04/20 appointment with [facility] in person for teaching about DM . Collaborated with LCSW [name] in regards to housing and food shortage . Talked with the patient about getting a glucometer to check his blood sugars.  He states that he does not have one he has spoke with his physician about getting one and RNCM has sent a message to see if there is one available.  . 04/24/20 . Spoke with the patient today and he states that he is checking his blood sugars daily but when asked for values he is states that they are good.  I asked him to give me an exact number and he stated 150 . I explained to him the reason that I need to know his exact number so that we can work together to bring his numbers down.  He verbalized understanding and stated that he will have values for me the next time that I call.  Today though he is still not writing the numbers down stating that he hdoes not have anything to write with.  I asked the patient does he want to continue to work with me to help him and he stated that he does.  So I explained  that he has to help me  by working at home on his blood sugars.  He verbalized understanding. [name] He does state that he is still trying to stay active by walking.  He states that it gets lonely in his home.  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 [name] He stated that he is still on the list for housing authority and has not heard anything else . He has not call his insurance company to find out a list of dentist in area.  He  states that he will call.  He also stated that he wants to find an "eye doctor"    He stated that the doctor was on located on [address]. I looked up Dr Lucita Ferrara for him and gave him the number to call for an appointment.  He asked me to call and leave the information on his voicemail.                  Patient Self Care Activities:  . UNABLE to independently self manage diabetes  Please see past updates related to this goal by clicking on the "Past Updates" button in the selected goal          The care management team will reach out to the patient again over the next 14 days.   [name] RN, BSN, [facility] Care Management Coordinator [place] Phone: [phone] Fax: [phone]

## 2020-05-01 ENCOUNTER — Telehealth: Payer: Self-pay | Admitting: Family Medicine

## 2020-05-01 NOTE — Telephone Encounter (Signed)
   SF 05/01/2020   Name: Charles Fox   MRN: [id number]   DOB: 05-Feb-1951   AGE: 69 y.o.   GENDER: male   PCP [name], MD.   Called pt regarding Community Resource Referral for housing resources. Left message for patient to give Care Guide a call back.  Follow up on: 05/04/2020  [name], Care Management Phone: [phone] Email: [email or web address]

## 2020-05-11 NOTE — Telephone Encounter (Signed)
° °  SF 05/11/2020    Name: Charles Fox    MRN: [id number]    DOB: 07/14/1951    AGE: 69 y.o.    GENDER: male    PCP [name], MD.   Called pt regarding Community Resource Referral housing assistance. Patient stated that he also needs assistance with transportation and would also like to have an eye exam because he cannot see. [name] stated that he does not feel comfortable driving and he would like to see if he is eligible for SCAT as well as transportation through [facility]. Patient stated that currently he is lives alone and that he would like to live in senior housing. His rent is $1000 a month, but he receives $1400 in disability and also gets a pension as well. [name] also needs assistance with meals, he stated that he is afraid to cook because he has issues with his eyes. Informed patient that Care Guide will reach out to [employer] [facility] [place] and contact [employer]. Care Guide will follow up with patient on 05/12/20 to assist with SCAT application and [facility] Medicare Transportation services.   Follow up on: 05/12/20  [place], Care Management Phone: [phone] Email: [email or web address]

## 2020-05-12 NOTE — Telephone Encounter (Signed)
   SF 05/12/2020   Name: Charles Fox   MRN: [id number]   DOB: Jan 24, 1951   AGE: 69 y.o.   GENDER: male   PCP [name], MD.   Spoke with Charles Fox today regarding referral. Informed patient that [place] [employer] has called [facility], but did not receive an answer. Patient stated that he has tried to contact them before, but they do not have any properties that he is interested in at this time. Also informed patient that he has been placed on the wait-list for [employer]als [employer] Wheels in [facility] and that it may take between 5 to 6 months before he receives a call from a Education officer, museum. Patient stated understanding. Charles Fox is still complaining about his vision. He stated that he has been to [facility] in the past, but he called and they do not accept his insurance. He also stated that he wanted to go to an eye doctor on [address] in [place]. Per notes from 04/24/20 by [name], RN patient was provided the number for Dr. Lucita Ferrara at [place]. Sent message to Traci to see if Care Guide should give patient this information again or if he needs to come in the office to be seen. Patient would also like to complete the SCAT application. Sent e-mail to [name], LCSW to have provider complete Part B and send application to patient to complete Part A. Informed patient that Care Guide will give him a call back on 05/14/20 for follow up.    [place], Care Management Phone: [phone] Email: [email or web address]

## 2020-05-14 ENCOUNTER — Ambulatory Visit: Payer: Medicare Other

## 2020-05-14 ENCOUNTER — Ambulatory Visit: Payer: Self-pay | Admitting: Licensed Clinical Social Worker

## 2020-05-14 DIAGNOSIS — Z7189 Other specified counseling: Secondary | ICD-10-CM

## 2020-05-14 NOTE — Chronic Care Management (AMB) (Signed)
   Social Work  Care Management Collaboration 05/14/2020 Name: YONG GRIESER MRN: [id number] DOB: [birth year] KYREE ADRIANO is a 70 y.o. year old male who sees [name], MD for primary care. LCSW was consulted by [place],  patient is having difficulty seeing and is unable to drive, would like to ride SCAT. LCSW collaborated with CCM RN as she has phone appointment scheduled with patient.  Recommendation: After collaboration with CCM RN care manager it is determined that patient may benefit from coming in to see PCP.   Intervention: Patient was not interviewed or contacted during this encounter.  LCSW collaborated with CCM RN, [facility] office staff to schedule appointment; [place] for SCAT application as well as setting up transportation to office visit with PCP. Plan:  1. CCM RN care manager will follow up with the patient for ongoing needs.    2. LCSW will meet with patient after visit with PCP.    Review of patient status, including review of consultants reports, relevant laboratory and other test results, and collaboration with appropriate care team members and the patient's provider was performed as part of comprehensive patient evaluation and provision of chronic care management services.     [name], [place] / [place]   [phone] 3:55 PM

## 2020-05-14 NOTE — Patient Instructions (Signed)
Visit Information  Goals Addressed              This Visit's Progress     I don't understand about my diabetes (pt-stated)        CARE PLAN ENTRY (see longtitudinal plan of care for additional care plan information)  Objective:  Lab Results  Component Value Date   HGBA1C 10.4 (A) 02/21/2020    Lab Results  Component Value Date   CREATININE 0.68 10/15/2019   CREATININE 0.82 04/11/2019   CREATININE 0.87 03/26/2018     No results found for: EGFR  Current Barriers:   Knowledge Deficits related to basic Diabetes pathophysiology and self care/management  Difficulty obtaining or cannot afford medications  Does not have glucometer to monitor blood sugar  Case Manager Clinical Goal(s):  Over the next 90 days, patient will demonstrate improved adherence to prescribed treatment plan for diabetes self care/management as evidenced by: lower his a1c by 1-2 points  daily monitoring and recording of CBG   Interventions:   Provided education to patient about basic DM disease process  Reviewed medications with patient and discussed importance of medication adherence  Discussed plans with patient for ongoing care management follow up and provided patient with direct contact information for care management team  Provided patient with written educational materials related to hypo and hyperglycemia and importance of correct treatment  Reviewed scheduled/upcoming provider appointments including: 03/04/20 appointment with [facility] in person for teaching about DM  Collaborated with LCSW Charles Fox in regards to housing and food shortage  Talked with the patient about getting a glucometer to check his blood sugars.  He states that he does not have one he has spoke with his physician about getting one and RNCM has sent a message to see if there is one available.   05/14/20  Spoke with the patient today and he in not checking his blood sugars regularly.  He states that his blood sugars  are ranging in the 150-160.  He states that he has problems with his left eye and he can't see I asked him did he check with the doctor that he asked me about but he could not remember any of the information.  I told him I Ieft it on his answering machine as he requested.  He said he couldn't remember how to use the voicemail. He has not checked for a dentist or on housing. He is unable to find his insurance card.  I expressed that he needed an appointment to come in the office to be seen for his eyes and teeth to get a referral.  I also told him we will set up transportation since he said he is unable to see to drive.  His appointment is 05-26-20 at 210 pm.  A message was sent to [facility] to have transportation ready for the patient.  The information was text to the patient's phone for him. The date , time and that he will have transportation for the appointment.                Patient Self Care Activities:   UNABLE to independently self manage diabetes  Please see past updates related to this goal by clicking on the "Past Updates" button in the selected goal         Charles Fox was given information about Care Management services today including:  1. Care Management services include personalized support from designated clinical staff supervised by his physician, including individualized plan of care  and coordination with other care providers 2. 24/7 contact phone numbers for assistance for urgent and routine care needs. 3. The patient may stop CCM services at any time (effective at the end of the month) by phone call to the office staff.  Patient agreed to services and verbal consent obtained.   The patient verbalized understanding of instructions provided today and declined a print copy of patient instruction materials.   The care management team will reach out to the patient again over the next 14 days.   [name] RN, BSN, [facility] Care Management Coordinator [place] Phone: [phone] Fax: [phone]

## 2020-05-14 NOTE — Telephone Encounter (Signed)
Spoke with Charles Fox today regarding SCAT application. Assisted patient with completing Part A of the SCAT application and send form back to LCSW for patient to sign when he comes into the office.

## 2020-05-14 NOTE — Chronic Care Management (AMB) (Signed)
Care Management   Follow Up Note   05/14/2020 Name: Charles Fox MRN: [id number] DOB: [birth year]  Referred by: [name], MD Reason for referral : Chronic Care Management (DM II)   Charles Fox is a 69 y.o. year old male who is a primary care patient of [name], MD. The care management team was consulted for assistance with care management and care coordination needs.    Review of patient status, including review of consultants reports, relevant laboratory and other test results, and collaboration with appropriate care team members and the patient's provider was performed as part of comprehensive patient evaluation and provision of chronic care management services.    SDOH (Social Determinants of Health) assessments performed: No See Care Plan activities for detailed interventions related to [facility])     Advanced Directives: See Care Plan and Vynca application for related entries.   Goals Addressed              This Visit's Progress   .  I don't understand about my diabetes (pt-stated)        CARE PLAN ENTRY (see longtitudinal plan of care for additional care plan information)  Objective:  Lab Results  Component Value Date   HGBA1C 10.4 (A) 02/21/2020 .   Lab Results  Component Value Date   CREATININE 0.68 10/15/2019   CREATININE 0.82 04/11/2019   CREATININE 0.87 03/26/2018 .   [name] No results found for: EGFR  Current Barriers:  [name] Knowledge Deficits related to basic Diabetes pathophysiology and self care/management . Difficulty obtaining or cannot afford medications . Does not have glucometer to monitor blood sugar  Case Manager Clinical Goal(s):  Over the next 90 days, patient will demonstrate improved adherence to prescribed treatment plan for diabetes self care/management as evidenced by: lower his a1c by 1-2 points . daily monitoring and recording of CBG   Interventions:  . Provided education to patient about basic DM disease  process . Reviewed medications with patient and discussed importance of medication adherence . Discussed plans with patient for ongoing care management follow up and provided patient with direct contact information for care management team . Provided patient with written educational materials related to hypo and hyperglycemia and importance of correct treatment . Reviewed scheduled/upcoming provider appointments including: 03/04/20 appointment with [facility] in person for teaching about DM . Collaborated with LCSW [name] in regards to housing and food shortage . Talked with the patient about getting a glucometer to check his blood sugars.  He states that he does not have one he has spoke with his physician about getting one and RNCM has sent a message to see if there is one available.  . 05/14/20 . Spoke with the patient today and he in not checking his blood sugars regularly.  He states that his blood sugars are ranging in the 150-160.  He states that he has problems with his left eye and he can't see I asked him did he check with the doctor that he asked me about but he could not remember any of the information.  I told him I Ieft it on his answering machine as he requested.  He said he couldn't remember how to use the voicemail. He has not checked for a dentist or on housing. He is unable to find his insurance card. . I expressed that he needed an appointment to come in the office to be seen for his eyes and teeth to get a referral.  I also  told him we will set up transportation since he said he is unable to see to drive.  His appointment is 05-26-20 at 210 pm.  A message was sent to [facility] to have transportation ready for the patient.  The information was text to the patient's phone for him. The date , time and that he will have transportation for the appointment.                Patient Self Care Activities:  . UNABLE to independently self manage diabetes  Please see past  updates related to this goal by clicking on the "Past Updates" button in the selected goal          The care management team will reach out to the patient again over the next 14 days.   [name] RN, BSN, [facility] Care Management Coordinator [place] Phone: [phone] Fax: [phone]

## 2020-05-15 ENCOUNTER — Ambulatory Visit: Payer: Self-pay

## 2020-05-15 ENCOUNTER — Ambulatory Visit: Payer: Self-pay | Admitting: Licensed Clinical Social Worker

## 2020-05-15 NOTE — Telephone Encounter (Signed)
   SF 05/15/2020   Name: Charles Fox   MRN: [id number]   DOB: [birth year]   AGE: 69 y.o.   GENDER: male   PCP [name], MD.  Spoke with [name] on 05/14/2020 regarding insurance. Patient checked his insurance cards again and stated that he has [employer]. Care Guide called insurance company with [name] on a 3-way call to determine if he is eligible for [facility] Medicare transportation services. Upon speaking with the representative she stated that his benefit will run out on 05/19/2020. [name] stated understanding. Per LCSW okay to schedule with [facility] Transportation Service for patient's visit on 05/26/2020.  Completed the General Intake Form and e-mailed form to transportation services. Informed patient that his transportation has been scheduled for his appointment. Patient stated understanding.  [name] has completed the application for [employer] and is waiting to hear back from their office, he has been placed on the wait-list for Meals on Wheels in [facility], he will complete the application process for SCAT once he comes to the office on 05/26/20, and he has been scheduled with transportation. Patient has no additional needs at this time. Informed patient he can give office a call if any additional needs arise. Patient stated understanding.   Closing referral pending any other needs of patient.   [place], Care Management Phone: [phone] Email: [email or web address]

## 2020-05-15 NOTE — Chronic Care Management (AMB) (Signed)
   Social Work  Care Management Collaboration 05/15/2020 Name: DRAYDEN LUKAS MRN: [id number] DOB: [birth year] CJ EDGELL is a 69 y.o. year old male who sees [name], MD for primary care.  Intervention: Patient was not interviewed or contacted during this encounter.  LCSW continued collaborated with PCP, Optometrist, [place] guide and CCM RN. For ongoing needs and concerns with patient.  Plan:  1. Patient will meet with PCP 05/26/20 2. Transportation arranged for appointment 3. Patient scheduled for [facility] end of Sept  Review of patient status, including review of consultants reports, relevant laboratory and other test results, and collaboration with appropriate care team members and the patient's provider was performed as part of comprehensive patient evaluation and provision of chronic care management services.     [name], [place] / [place]   [phone] 10:07 AM

## 2020-05-15 NOTE — Patient Instructions (Signed)
Visit Information  Goals Addressed              This Visit's Progress   .  I don't understand about my diabetes (pt-stated)        CARE PLAN ENTRY (see longtitudinal plan of care for additional care plan information)  Objective:  Lab Results  Component Value Date   HGBA1C 10.4 (A) 02/21/2020 .   Lab Results  Component Value Date   CREATININE 0.68 10/15/2019   CREATININE 0.82 04/11/2019   CREATININE 0.87 03/26/2018 .   [name] No results found for: EGFR  Current Barriers:  [name] Knowledge Deficits related to basic Diabetes pathophysiology and self care/management . Difficulty obtaining or cannot afford medications . Does not have glucometer to monitor blood sugar  Case Manager Clinical Goal(s):  Over the next 90 days, patient will demonstrate improved adherence to prescribed treatment plan for diabetes self care/management as evidenced by: lower his a1c by 1-2 points . daily monitoring and recording of CBG   Interventions:  . Provided education to patient about basic DM disease process . Reviewed medications with patient and discussed importance of medication adherence . Discussed plans with patient for ongoing care management follow up and provided patient with direct contact information for care management team . Provided patient with written educational materials related to hypo and hyperglycemia and importance of correct treatment . Reviewed scheduled/upcoming provider appointments including: 03/04/20 appointment with [facility] in person for teaching about DM . Collaborated with LCSW Charles Fox in regards to housing and food shortage . Talked with the patient about getting a glucometer to check his blood sugars.  He states that he does not have one he has spoke with his physician about getting one and RNCM has sent a message to see if there is one available.  . 05/14/20 . Spoke with the patient today and he in not checking his blood sugars regularly.  He states that his blood sugars  are ranging in the 150-160.  He states that he has problems with his left eye and he can't see I asked him did he check with the doctor that he asked me about but he could not remember any of the information.  I told him I Ieft it on his answering machine as he requested.  He said he couldn't remember how to use the voicemail. He has not checked for a dentist or on housing. He is unable to find his insurance card. . I expressed that he needed an appointment to come in the office to be seen for his eyes and teeth to get a referral.  I also told him we will set up transportation since he said he is unable to see to drive.  His appointment is 05-26-20 at 210 pm.  A message was sent to [facility] to have transportation ready for the patient.  The information was text to the patient's phone for him. The date , time and that he will have transportation for the appointment. . 05/15/20 . Patient called and notified that transportation for appointment is arranged and confirmed for 05/26/20                Patient Self Care Activities:  . UNABLE to independently self manage diabetes  Please see past updates related to this goal by clicking on the "Past Updates" button in the selected goal         Charles Fox was given information about Care Management services today including:  1. Care Management  services include personalized support from designated clinical staff supervised by his physician, including individualized plan of care and coordination with other care providers 2. 24/7 contact phone numbers for assistance for urgent and routine care needs. 3. The patient may stop CCM services at any time (effective at the end of the month) by phone call to the office staff.  Patient agreed to services and verbal consent obtained.   The patient verbalized understanding of instructions provided today and declined a print copy of patient instruction materials.    Charles Arms RN, BSN, [facility] Care  Management Coordinator [place] Phone: [phone] Fax: [phone]

## 2020-05-15 NOTE — Chronic Care Management (AMB) (Signed)
Care Management   Follow Up Note   05/15/2020 Name: Charles Fox MRN: [id number] DOB: [birth year]  Referred by: [name], MD Reason for referral : No chief complaint on file.   Charles Fox is a 69 y.o. year old male who is a primary care patient of [name], MD. The care management team was consulted for assistance with care management and care coordination needs.    Review of patient status, including review of consultants reports, relevant laboratory and other test results, and collaboration with appropriate care team members and the patient's provider was performed as part of comprehensive patient evaluation and provision of chronic care management services.    SDOH (Social Determinants of Health) assessments performed: No See Care Plan activities for detailed interventions related to [facility])     Advanced Directives: See Care Plan and Vynca application for related entries.   Goals Addressed              This Visit's Progress   .  I don't understand about my diabetes (pt-stated)        CARE PLAN ENTRY (see longtitudinal plan of care for additional care plan information)  Objective:  Lab Results  Component Value Date   HGBA1C 10.4 (A) 02/21/2020 .   Lab Results  Component Value Date   CREATININE 0.68 10/15/2019   CREATININE 0.82 04/11/2019   CREATININE 0.87 03/26/2018 .   [name] No results found for: EGFR  Current Barriers:  [name] Knowledge Deficits related to basic Diabetes pathophysiology and self care/management . Difficulty obtaining or cannot afford medications . Does not have glucometer to monitor blood sugar  Case Manager Clinical Goal(s):  Over the next 90 days, patient will demonstrate improved adherence to prescribed treatment plan for diabetes self care/management as evidenced by: lower his a1c by 1-2 points . daily monitoring and recording of CBG   Interventions:  . Provided education to patient about basic DM disease process . Reviewed  medications with patient and discussed importance of medication adherence . Discussed plans with patient for ongoing care management follow up and provided patient with direct contact information for care management team . Provided patient with written educational materials related to hypo and hyperglycemia and importance of correct treatment . Reviewed scheduled/upcoming provider appointments including: 03/04/20 appointment with [facility] in person for teaching about DM . Collaborated with LCSW [name] in regards to housing and food shortage . Talked with the patient about getting a glucometer to check his blood sugars.  He states that he does not have one he has spoke with his physician about getting one and RNCM has sent a message to see if there is one available.  . 05/14/20 . Spoke with the patient today and he in not checking his blood sugars regularly.  He states that his blood sugars are ranging in the 150-160.  He states that he has problems with his left eye and he can't see I asked him did he check with the doctor that he asked me about but he could not remember any of the information.  I told him I Ieft it on his answering machine as he requested.  He said he couldn't remember how to use the voicemail. He has not checked for a dentist or on housing. He is unable to find his insurance card. . I expressed that he needed an appointment to come in the office to be seen for his eyes and teeth to get a referral.  I also  told him we will set up transportation since he said he is unable to see to drive.  His appointment is 05-26-20 at 210 pm.  A message was sent to [facility] to have transportation ready for the patient.  The information was text to the patient's phone for him. The date , time and that he will have transportation for the appointment. . 05/15/20 . Patient called and notified that transportation for appointment is arranged and confirmed for 05/26/20                Patient  Self Care Activities:  . UNABLE to independently self manage diabetes  Please see past updates related to this goal by clicking on the "Past Updates" button in the selected goal          [place], BSN, [place] Phone: [phone] Fax: [phone]

## 2020-05-21 MED FILL — BASAGLAR 100 UNIT/ML KWIKPE: 100 | 30 days supply | Qty: 9 | Fill #1

## 2020-05-21 MED FILL — ATORVASTATIN 40 MG TABLET: 40 | 30 days supply | Qty: 30 | Fill #2

## 2020-05-26 ENCOUNTER — Encounter: Payer: Self-pay | Admitting: Family Medicine

## 2020-05-26 ENCOUNTER — Other Ambulatory Visit: Payer: Self-pay

## 2020-05-26 ENCOUNTER — Ambulatory Visit: Payer: Medicare Other | Admitting: Licensed Clinical Social Worker

## 2020-05-26 ENCOUNTER — Telehealth: Payer: Self-pay | Admitting: Licensed Clinical Social Worker

## 2020-05-26 ENCOUNTER — Ambulatory Visit (INDEPENDENT_AMBULATORY_CARE_PROVIDER_SITE_OTHER): Payer: Medicare Other | Admitting: Family Medicine

## 2020-05-26 ENCOUNTER — Ambulatory Visit: Payer: Self-pay

## 2020-05-26 VITALS — BP 132/76 | HR 87 | Ht 68.0 in | Wt 274.8 lb

## 2020-05-26 DIAGNOSIS — Z794 Long term (current) use of insulin: Secondary | ICD-10-CM

## 2020-05-26 DIAGNOSIS — H547 Unspecified visual loss: Secondary | ICD-10-CM | POA: Diagnosis not present

## 2020-05-26 DIAGNOSIS — E119 Type 2 diabetes mellitus without complications: Secondary | ICD-10-CM

## 2020-05-26 DIAGNOSIS — F0390 Unspecified dementia without behavioral disturbance: Secondary | ICD-10-CM

## 2020-05-26 DIAGNOSIS — Z7189 Other specified counseling: Secondary | ICD-10-CM

## 2020-05-26 LAB — POCT GLYCOSYLATED HEMOGLOBIN (HGB A1C): HbA1c, POC (controlled diabetic range): 10.3 % — AB (ref 0.0–7.0)

## 2020-05-26 LAB — POCT UA - MICROALBUMIN
Creatinine, POC: 300 mg/dL
Microalbumin Ur, POC: 150 mg/L

## 2020-05-26 NOTE — Chronic Care Management (AMB) (Signed)
    Clinical Social Work  Care Management Outreach   05/26/2020 Name: Charles Fox MRN: [id number] DOB: 1951/06/13  Charles Fox is a 69 y.o. year old male who is a primary care patient of [name], MD .  The Care Management team was consulted for assistance with care coordination.  LCSW reached out to [name] today by phone to remind him of appointment at [facility] and that transportation would pick him up at 1:30 .  The outreach was unsuccessful.  A HIPPA compliant phone message was left for the patient providing contact information and requesting a return call.   Plan: If no return call LCSW will call again in a few hours, Intervention: LCSW collaborated with CCM RN informed her unable to reach patient;  CCM RN called patient; remind him of appointment and transportation pick up time.  Review of patient status, including review of consultants reports, relevant laboratory and other test results, and collaboration with appropriate care team members and the patient's provider was performed as part of comprehensive patient evaluation and provision of care management services.     [name], [place] / [place]   [phone] 9:40 AM

## 2020-05-26 NOTE — Patient Instructions (Addendum)
It was nice to see you today,   I will get some blood work and put in a referral for a CT scan of your head.    Neoma Laming will continue to work with you.   You have an appointment with [place] again on September 23rd.  I will see you then.    Have a great day,   Clemetine Marker

## 2020-05-26 NOTE — Progress Notes (Signed)
    SUBJECTIVE:   CHIEF COMPLAINT / HPI:   Dementia assessment:: discussed with pt his current living situation and level of functioning.  He has been living alone since his wife died 4 years ago.  He feels 'lonely' and doesn't socialize much with other people.  He takes care of his own medications, pays his own bills.  He drives himself, but his eyesight is getting worse and doesn't drive if he doesn't have his glasses.     PERTINENT  PMH / PSH: DM, HTN  OBJECTIVE:   BP 132/76   Pulse 87   Ht 5\' 8"  (1.727 m)   Wt 274 lb 12.8 oz (124.6 kg)   SpO2 97%   BMI 41.78 kg/m   Gen: alert.  Oriented x4. Overweight elderly male.  Psych: wearing sweatshirt and pants. Clothes faded but no stains or tears.  Pleasant affect. Spontaneous speech. While doing the MoCA exam, after realizing he was not doing well on the test he stated he would like to finish the test another time.   MoCA: pt scored an 11/30 (unadjusted for education level). Scored perfect scores for naming and orientation but otherwise performed poorly.    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  He lives alone and does not interact with others for the most part, which is likely increasing the rate of his cognitive decline.  He has a geriatric clinic appt set up for later this month and has been working with social work on helping him get the resources he needs.  Pt has so far declined assistance such as [facility] adult day program.  Will get lab workup for reversible causes of dementia, and CT head.  Will arrange transportation through social work for CT scan.   Impaired vision Pt failed vision test today.  Has glasses but forgot them today and thinks they need to be adjusted.  Will arrange for vision screening with opthomologist.       [name], MD [place]

## 2020-05-26 NOTE — Chronic Care Management (AMB) (Signed)
  Care Management     Care Management Outreach Note  05/26/2020 Name: [name] MRN: [id number] DOB: [birth year]  Charles Fox is a 69 y.o. year old male who is a primary care patient of [name], MD . The Care Management team was consulted for assistance with . Care Coordination for appointment at [facility].  RN Care Manager reached out to [name] today by phone to remind him of his appointment today at [facility] and transportation will be there to pick him up at 1:30 Pm today the patient verbalized understanding.      Follow Up Plan: RNCM will follow up with the  Patient at the next scheduled interval.   [name] RN, BSN, [facility] Care Management Coordinator [place] Phone: [phone] Fax: [phone]

## 2020-05-27 ENCOUNTER — Ambulatory Visit: Payer: Self-pay | Admitting: Licensed Clinical Social Worker

## 2020-05-27 ENCOUNTER — Encounter: Payer: Self-pay | Admitting: Licensed Clinical Social Worker

## 2020-05-27 DIAGNOSIS — F0390 Unspecified dementia without behavioral disturbance: Secondary | ICD-10-CM | POA: Insufficient documentation

## 2020-05-27 DIAGNOSIS — H547 Unspecified visual loss: Secondary | ICD-10-CM | POA: Insufficient documentation

## 2020-05-27 DIAGNOSIS — Z7189 Other specified counseling: Secondary | ICD-10-CM

## 2020-05-27 LAB — COMPREHENSIVE METABOLIC PANEL
ALT: 24 IU/L (ref 0–44)
AST: 24 IU/L (ref 0–40)
Albumin/Globulin Ratio: 1.4 (ref 1.2–2.2)
Albumin: 4.1 g/dL (ref 3.8–4.8)
Alkaline Phosphatase: 156 IU/L — ABNORMAL HIGH (ref 48–121)
BUN/Creatinine Ratio: 12 (ref 10–24)
BUN: 9 mg/dL (ref 8–27)
Bilirubin Total: 0.5 mg/dL (ref 0.0–1.2)
CO2: 23 mmol/L (ref 20–29)
Calcium: 9.3 mg/dL (ref 8.6–10.2)
Chloride: 99 mmol/L (ref 96–106)
Creatinine, Ser: 0.74 mg/dL — ABNORMAL LOW (ref 0.76–1.27)
GFR calc Af Amer: 111 mL/min/{1.73_m2} (ref >59)
GFR calc non Af Amer: 96 mL/min/{1.73_m2} (ref >59)
Globulin, Total: 3 g/dL (ref 1.5–4.5)
Glucose: 213 mg/dL — ABNORMAL HIGH (ref 65–99)
Potassium: 4.4 mmol/L (ref 3.5–5.2)
Sodium: 135 mmol/L (ref 134–144)
Total Protein: 7.1 g/dL (ref 6.0–8.5)

## 2020-05-27 LAB — RPR: RPR Ser Ql: NONREACTIVE

## 2020-05-27 LAB — CBC
Hematocrit: 45.7 % (ref 37.5–51.0)
Hemoglobin: 15.3 g/dL (ref 13.0–17.7)
MCH: 30.2 pg (ref 26.6–33.0)
MCHC: 33.5 g/dL (ref 31.5–35.7)
MCV: 90 fL (ref 79–97)
Platelets: 287 10*3/uL (ref 150–450)
RBC: 5.07 x10E6/uL (ref 4.14–5.80)
RDW: 12.6 % (ref 11.6–15.4)
WBC: 8.4 10*3/uL (ref 3.4–10.8)

## 2020-05-27 LAB — TSH: TSH: 0.929 u[IU]/mL (ref 0.450–4.500)

## 2020-05-27 LAB — C-REACTIVE PROTEIN: CRP: 2 mg/L (ref 0–10)

## 2020-05-27 LAB — HIV ANTIBODY (ROUTINE TESTING W REFLEX): HIV Screen 4th Generation wRfx: NONREACTIVE

## 2020-05-27 LAB — VITAMIN B12: Vitamin B-12: 711 pg/mL (ref 232–1245)

## 2020-05-27 NOTE — Assessment & Plan Note (Signed)
Pt failed vision test today.  Has glasses but forgot them today and thinks they need to be adjusted.  Will arrange for vision screening with opthomologist.

## 2020-05-27 NOTE — Chronic Care Management (AMB) (Addendum)
Care Management   Clinical Social Work Follow Up   05/27/2020 Name: Charles Fox MRN: [id number] DOB: 10-19-1950 Referred by: Charles Pike, MD  Reason for referral : [name] is a 69 y.o. year old male who is a primary care patient of Charles Kitten Garen Lah, MD.  Reason for follow-up: assess for barriers and progress with care plan and collaborative office visit with patient and PCP.  Assessment: Patient continues to experience difficulty with navigating his health care needs. Often states he forgot and is having difficulty with remembering to follow through, misplacing things and often changing his story from previous convesations. Patient requires frequent redirecting when talking to him. Patient previously requested SCAT services however during this encounter he denies needing the service.    Reports concerns with his eyes. Interventions: Coordinated transportation to appointment today, collaborated with PCP, front office staff, Charles Fox, CMA and met with patient. Plan:  1. LCSW will assist patient with scheduling eye appointment 2.    As well as transportation to [place] Resources Status: NO  Patient declines states he has no one to list  SDOH (Social Determinants of Health) assessments performed: ; No needs identified   Goals Addressed            This Visit's Progress   . Speciality Appointments       CARE PLAN ENTRY (see longitudinal plan of care for additional care plan information)  Current Barriers:  . Patient needs to get to multi speciality appointments and having difficulty navigating   . Patient needs to go to the eye doctor and [place] (appointment Sept 17th at 3:10)needs to arrive by 2:45 . Patient acknowledges deficits and needs support, education and care coordination in order to meet this unmet need  . Memory Deficits Clinical Goal(s)  Over the next 30 days, patient will work with CCM Team to get to  speciality appointments Interventions provided by LCSW:  . Assessment of needs and barriers to care as well as how impacting    . Provided patient with information about importance of appointments . Collaborated with RN Case Manager re: ongoing needs and barriers . Collaborated with primary care provider re: ongoing needs and barrier . Motivational Interviewing  Patient Self Care Activities & Deficits:  . Patient is unable to independently navigate community resource options without care coordination support  . Patient is motivated to resolve concern  . Patient is unable to contact agencies or set up transportation as this has been an ongoing barrier   Initial goal documentation      Outpatient Encounter Medications as of 05/26/2020  Medication Sig  . Alcohol Swabs PADS He is to check blood glucose 2-3 times a day  . aspirin 81 MG tablet Take 1 tablet (81 mg total) by mouth daily.  [name] atorvastatin (LIPITOR) 40 MG tablet Take 1 tablet (40 mg total) by mouth daily.  . blood glucose meter kit and supplies KIT Dispense based on patient and insurance preference. Use up to four times daily as directed. (FOR ICD-9 250.00, 250.01). (Patient not taking: Reported on 03/04/2020)  . Blood Glucose Monitoring Suppl (ONETOUCH VERIO) w/Device KIT by Does not apply route.  [name] glucose blood (ONETOUCH VERIO) test strip Please use to check blood glucose once in the AM while fasting prior to giving insulin.  [name] insulin degludec (TRESIBA FLEXTOUCH) 100 UNIT/ML FlexTouch Pen Inject 0.3 mLs (30 Units total) into the skin daily.  . Insulin Syringe-Needle U-100 (INSULIN  SYRINGE .5CC/30GX1/2") 30G X 1/2" 0.5 ML MISC Use with glargine. Dispose after each use.  . losartan (COZAAR) 50 MG tablet Take 1 tablet (50 mg total) by mouth at bedtime.  . metFORMIN (GLUCOPHAGE-XR) 500 MG 24 hr tablet Take 1 tablet (500 mg total) by mouth 2 (two) times daily with a meal.  . OneTouch Delica Lancets 05R MISC Please use to check blood  glucose once in the AM while fasting prior to giving insulin.   No facility-administered encounter medications on file as of 05/26/2020.   Review of patient status, including review of consultants reports, relevant laboratory and other test results, and collaboration with appropriate care team members and the patient's provider was performed as part of comprehensive patient evaluation and provision of care management services.    Charles Fox, [place] / [place]   [phone]

## 2020-05-27 NOTE — Assessment & Plan Note (Signed)
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  He lives alone and does not interact with others for the most part, which is likely increasing the rate of his cognitive decline.  He has a geriatric clinic appt set up for later this month and has been working with social work on helping him get the resources he needs.  Pt has so far declined assistance such as [facility] adult day program.  Will get lab workup for reversible causes of dementia, and CT head.  Will arrange transportation through social work for CT scan.

## 2020-05-27 NOTE — Chronic Care Management (AMB) (Signed)
   Social Work  Care Management Collaboration 05/27/2020 Name: Charles Fox MRN: [id number] DOB: 1951/04/03 Charles Fox is a 69 y.o. year old male who sees [name], MD for primary care. LCSW was consulted by PCP to assistance patient with  Care Coordination.   Intervention: Patient was not interviewed or contacted during this encounter.  LCSW collaborated with [place], [employer] . Plan:   1. RN care manager will follow up with the patient for ongoing needs and continue collaboration with LCSW. 2. LCSW mailed letter to patient with Imaging details with transporation , will F/U in 5 to 7 days.  Review of patient status, including review of consultants reports, relevant laboratory and other test results, and collaboration with appropriate care team members and the patient's provider was performed as part of comprehensive patient evaluation and provision of chronic care management services.    Goals Addressed            This Visit's Progress   . Speciality Appointments   On track    [place] (see longitudinal plan of care for additional care plan information)  Current Barriers:  . Patient needs to get to multi speciality appointments and having difficulty navigating   . Patient needs to go to the eye doctor and [place] (appointment Sept 17th at 3:10)needs to arrive by 2:45 . Patient acknowledges deficits and needs support, education and care coordination in order to meet this unmet need  . Memory Deficits . Transportation arranged for patient's appointment via [place])  Over the next 30 days, patient will work with CCM Team to get to speciality appointments Interventions provided by LCSW:  . Assessment of needs and barriers to care as well as how impacting    . Provided patient with information about importance of appointments . Will continue to collaborate with RN Case Manager re: scheduling eye exam . Collaborated with  primary care provider re: ongoing needs and barrier . Contacted insurance provider to verify patient's insurance and transportation services. Patient has [facility] and they will transport to appointments . Letter mailed to patient informing him of appointment and transportation. Patient Self Care Activities & Deficits:  . Patient is unable to independently navigate community resource options without care coordination support  . Patient is motivated to resolve concern  . Patient is unable to contact agencies or set up transportation as this has been an ongoing barrier   Please see past updates related to this goal by clicking on the "Past Updates" button in the selected goal     Charles Fox, [place] / [place]   [phone] 11:18 AM

## 2020-05-28 DIAGNOSIS — E113592 Type 2 diabetes mellitus with proliferative diabetic retinopathy without macular edema, left eye: Secondary | ICD-10-CM | POA: Diagnosis not present

## 2020-05-28 DIAGNOSIS — H40053 Ocular hypertension, bilateral: Secondary | ICD-10-CM | POA: Diagnosis not present

## 2020-05-28 DIAGNOSIS — E113511 Type 2 diabetes mellitus with proliferative diabetic retinopathy with macular edema, right eye: Secondary | ICD-10-CM | POA: Diagnosis not present

## 2020-05-29 ENCOUNTER — Encounter (INDEPENDENT_AMBULATORY_CARE_PROVIDER_SITE_OTHER): Payer: Self-pay | Admitting: Ophthalmology

## 2020-05-29 ENCOUNTER — Other Ambulatory Visit: Payer: Self-pay

## 2020-05-29 ENCOUNTER — Ambulatory Visit (INDEPENDENT_AMBULATORY_CARE_PROVIDER_SITE_OTHER): Payer: Medicare HMO | Admitting: Ophthalmology

## 2020-05-29 DIAGNOSIS — E113513 Type 2 diabetes mellitus with proliferative diabetic retinopathy with macular edema, bilateral: Secondary | ICD-10-CM | POA: Diagnosis not present

## 2020-05-29 DIAGNOSIS — H35033 Hypertensive retinopathy, bilateral: Secondary | ICD-10-CM

## 2020-05-29 DIAGNOSIS — I10 Essential (primary) hypertension: Secondary | ICD-10-CM | POA: Diagnosis not present

## 2020-05-29 DIAGNOSIS — H25813 Combined forms of age-related cataract, bilateral: Secondary | ICD-10-CM

## 2020-05-29 DIAGNOSIS — H3581 Retinal edema: Secondary | ICD-10-CM | POA: Diagnosis not present

## 2020-05-29 NOTE — Progress Notes (Signed)
[facility] - Clinic Note  05/29/2020     CHIEF COMPLAINT Patient presents for Retina Evaluation   HISTORY OF PRESENT ILLNESS: Charles Fox is a 68 y.o. male who presents to the clinic today for:   HPI    Retina Evaluation    In left eye.  Duration of 2 days.  I, the attending physician,  performed the HPI with the patient and updated documentation appropriately.          Comments    Retina eval per Dr. Robert Groat- PDR OU with DME OD.   Pt states about 2 days ago he noticed a "spider web" in vision OS, constant.  Denies FOLs.   BS: pt does not check, unsure A1C        Last edited by , , MD on 05/29/2020  3:18 PM. (History)    pt is here on the referral of Dr. Scott Groat for concern of PDR OU, pt states he went to see him bc he felt like he had "trash" in his eye, he states it has gotten better since last week, pt states he is diabetic, last A1c was 10.3 (09.07.21)  Referring physician: Groat, Richard Scott, MD [address] [place],  [place] [address]  HISTORICAL INFORMATION:   Selected notes from the medical record:  Referred by Dr. S. Groat LEE:  Ocular Hx- PMH-    CURRENT MEDICATIONS: No current outpatient medications on file. (Ophthalmic Drugs)   No current facility-administered medications for this visit. (Ophthalmic Drugs)   Current Outpatient Medications (Other)  Medication Sig  . Alcohol Swabs PADS He is to check blood glucose 2-3 times a day  . aspirin 81 MG tablet Take 1 tablet (81 mg total) by mouth daily.  . atorvastatin (LIPITOR) 40 MG tablet Take 1 tablet (40 mg total) by mouth daily.  . blood glucose meter kit and supplies KIT Dispense based on patient and insurance preference. Use up to four times daily as directed. (FOR ICD-9 250.00, 250.01). (Patient not taking: Reported on 03/04/2020)  . Blood Glucose Monitoring Suppl (ONETOUCH VERIO) w/Device KIT by Does not apply route.  . glucose blood (ONETOUCH  VERIO) test strip Please use to check blood glucose once in the AM while fasting prior to giving insulin.  . insulin degludec (TRESIBA FLEXTOUCH) 100 UNIT/ML FlexTouch Pen Inject 0.3 mLs (30 Units total) into the skin daily.  . Insulin Syringe-Needle U-100 (INSULIN SYRINGE .5CC/30GX1/2") 30G X 1/2" 0.5 ML MISC Use with glargine. Dispose after each use.  . losartan (COZAAR) 50 MG tablet Take 1 tablet (50 mg total) by mouth at bedtime.  . metFORMIN (GLUCOPHAGE-XR) 500 MG 24 hr tablet Take 1 tablet (500 mg total) by mouth 2 (two) times daily with a meal.  . OneTouch Delica Lancets 33G MISC Please use to check blood glucose once in the AM while fasting prior to giving insulin.   No current facility-administered medications for this visit. (Other)      REVIEW OF SYSTEMS: ROS    Positive for: Musculoskeletal, Endocrine, Eyes   Negative for: Constitutional, Gastrointestinal, Neurological, Skin, Genitourinary, HENT, Cardiovascular, Respiratory, Psychiatric, Allergic/Imm, Heme/Lymph   Last edited by Hodges, Robin, COA on 05/29/2020  1:49 PM. (History)       ALLERGIES No Known Allergies  PAST MEDICAL HISTORY Past Medical History:  Diagnosis Date  . Diabetes mellitus without complication (HCC)   . Hypertension    Past Surgical History:  Procedure Laterality Date  .   BACK SURGERY    . FINGER SURGERY      FAMILY HISTORY History reviewed. No pertinent family history.  SOCIAL HISTORY Social History   Tobacco Use  . Smoking status: Current Every Day Smoker    Packs/day: 0.50    Types: Cigarettes  . Smokeless tobacco: Never Used  Vaping Use  . Vaping Use: Never used  Substance Use Topics  . Alcohol use: No  . Drug use: Not Currently         OPHTHALMIC EXAM:  Base Eye Exam    Visual Acuity (Snellen - Linear)      Right Left   Dist cc 20/100 +2 20/80 +2   Dist ph cc 20/80 +1 NI       Tonometry (Tonopen, 2:08 PM)      Right Left   Pressure 21 18       Pupils       Dark Light Shape React APD   Right 3 2 Round Minimal None   Left 3 2 Round Minimal None       Visual Fields (Counting fingers)      Left Right    Full Full  Difficulties with CVF         Extraocular Movement      Right Left    Full Full       Neuro/Psych    Oriented x3: Yes   Mood/Affect: Normal       Dilation    Both eyes: 1.0% Mydriacyl, 2.5% Phenylephrine @ 2:08 PM        Slit Lamp and Fundus Exam    Slit Lamp Exam      Right Left   Lids/Lashes Dermatochalasis - upper lid, mild Meibomian gland dysfunction Dermatochalasis - upper lid, mild Meibomian gland dysfunction   Conjunctiva/Sclera Mild Melanosis Mild Melanosis   Cornea arcus, trace Punctate epithelial erosions arcus, trace Punctate epithelial erosions   Anterior Chamber Deep and quiet Deep and quiet   Iris Round and dilated, +NVI Round and dilated, +early NVI   Lens 2-3+ Nuclear sclerosis, 2-3+ Cortical cataract 2-3+ Nuclear sclerosis, 2-3+ Cortical cataract   Vitreous Vitreous syneresis, blood stained vitreous condensations settled inferiorly Vitreous syneresis, blood stained vitreous condensations settled inferiorly       Fundus Exam      Right Left   Disc +NVD, Pink and Sharp Fibrotic NVD, Pink and Sharp   C/D Ratio 0.4    Macula Blunted foveal reflex, +edema/cystic changes, scattered IRH, focal exudates hazy view, grossly flat, +edema, scattered IRH   Vessels Vascular attenuation, Tortuous, +NV, sclerotic arterioles nasal periphery Vascular attenuation, Tortuous, AV crossing changes, +NV   Periphery Attached, sclerotic arterioles nasal periphery, mild White without pressure temporally, scattered IRH/DBH hazy view, grossly Attached, scattered IRH/DBH        Refraction    Wearing Rx      Sphere Cylinder Axis   Right -3.25 +0.50 120   Left -3.75 +0.75 010       Manifest Refraction      Sphere Cylinder Axis Dist VA   Right -3.25 +0.50 115 20/80-2   Left -3.00 +0.50 180 20/70-2          IMAGING  AND PROCEDURES  Imaging and Procedures for 05/29/2020  OCT, Retina - OU - Both Eyes       Right Eye Quality was borderline. Central Foveal Thickness: 388. Progression has no prior data. Findings include abnormal foveal contour, intraretinal fluid, intraretinal hyper-reflective material, epiretinal membrane, no SRF.     Left Eye Quality was poor. Central Foveal Thickness: 400. Progression has no prior data. Findings include abnormal foveal contour, intraretinal fluid, intraretinal hyper-reflective material, epiretinal membrane, no SRF (Diffuse vitreous opacities, +DME).   Notes *Images captured and stored on drive  Diagnosis / Impression:  OD: +DME greatest temporal macula OS: +DME: diffuse vitreous opacities . Clinical management:  See below  Abbreviations: NFP - Normal foveal profile. CME - cystoid macular edema. PED - pigment epithelial detachment. IRF - intraretinal fluid. SRF - subretinal fluid. EZ - ellipsoid zone. ERM - epiretinal membrane. ORA - outer retinal atrophy. ORT - outer retinal tubulation. SRHM - subretinal hyper-reflective material. IRHM - intraretinal hyper-reflective material        Fluorescein Angiography Optos (Transit OS)       Right Eye   Progression has no prior data. Early phase findings include microaneurysm, vascular perfusion defect, neovascularization disc, retinal neovascularization. Mid/Late phase findings include neovascularization disc, microaneurysm, retinal neovascularization, vascular perfusion defect (Massive vascular non-perfusion nasal quad).   Left Eye   Progression has no prior data. Early phase findings include delayed filling, vascular perfusion defect, microaneurysm, leakage, neovascularization disc, retinal neovascularization, blockage. Mid/Late phase findings include leakage, microaneurysm, retinal neovascularization, vascular perfusion defect, neovascularization disc, blockage.   Notes **Images stored on  drive**  Impression: PDR OU (OS>OD) Late leaking Mas OU +NVE/+NVD OU         Intravitreal Injection, Pharmacologic Agent - OD - Right Eye       Time Out 05/29/2020. 3:40 PM. Confirmed correct patient, procedure, site, and patient consented.   Anesthesia Topical anesthesia was used. Anesthetic medications included Lidocaine 2%, Proparacaine 0.5%.   Procedure Preparation included 5% betadine to ocular surface, eyelid speculum. A supplied needle was used.   Injection:  1.25 mg Bevacizumab (AVASTIN) SOLN   NDC: 32122-482-50, Lot: [id number] , Expiration date: 07/05/2020   Route: Intravitreal, Site: Right Eye, Waste: 0 mL  Post-op Post injection exam found visual acuity of at least counting fingers. The patient tolerated the procedure well. There were no complications. The patient received written and verbal post procedure care education. Post injection medications were not given.        Intravitreal Injection, Pharmacologic Agent - OS - Left Eye       Time Out 05/29/2020. 3:41 PM. Confirmed correct patient, procedure, site, and patient consented.   Anesthesia Topical anesthesia was used. Anesthetic medications included Lidocaine 2%, Proparacaine 0.5%.   Procedure Preparation included 5% betadine to ocular surface, eyelid speculum. A (32g) needle was used.   Injection:  1.25 mg Bevacizumab (AVASTIN) SOLN   NDC: 03704-888-91, Lot: [id number], Expiration date: 07/16/2020   Route: Intravitreal, Site: Left Eye, Waste: 0.05 mL  Post-op Post injection exam found visual acuity of at least counting fingers. The patient tolerated the procedure well. There were no complications. The patient received written and verbal post procedure care education. Post injection medications were not given.                 ASSESSMENT/PLAN:    ICD-10-CM   1. Proliferative diabetic retinopathy of both eyes with macular edema associated with type 2 diabetes mellitus (HCC)  U82.8003  Intravitreal Injection, Pharmacologic Agent - OD - Right Eye    Intravitreal Injection, Pharmacologic Agent - OS - Left Eye    Bevacizumab (AVASTIN) SOLN 1.25 mg    Bevacizumab (AVASTIN) SOLN 1.25 mg  2. Retinal edema  H35.81 OCT, Retina - OU - Both Eyes  3. Essential hypertension  I10   4.  Hypertensive retinopathy of both eyes  H35.033 Fluorescein Angiography Optos (Transit OS)  5. Combined forms of age-related cataract of both eyes  H25.813     1,2. Proliferative diabetic retinopathy w/ DME, OU (OS > OD) - The incidence, risk factors for progression, natural history and treatment options for diabetic retinopathy were discussed with patient.   - The need for close monitoring of blood glucose, blood pressure, and serum lipids, avoiding cigarette or any type of tobacco, and the need for long term follow up was also discussed with patient. - exam shows +NVI OU, VH OU, +NVD OU, +NVE OU, scattered MA/IRH OU - FA today (09.10.21) shows late-leaking MA, vascular nonperfusion, +NV -- will need PRP OU - OCT with diabetic macular edema, both eyes  - discussed findings, prognosis, and treatment plan that includes injections, laser, possible surgery - recommend IVA OU today, 09.10.21 - pt wishes to proceed with treatment today - RBA of procedure discussed, questions answered - Avastin informed consent obtained, signed and scanned, 09.10.21 - see procedure note - f/u 3 weeks, DFE, OCT, laser PRP  3,4. Hypertensive retinopathy OU - discussed importance of tight BP control - monitor  5. Mixed Cataract OU - The symptoms of cataract, surgical options, and treatments and risks were discussed with patient. - discussed diagnosis and progression - not yet visually significant - monitor for now   Ophthalmic Meds Ordered this visit:  Meds ordered this encounter  Medications  . Bevacizumab (AVASTIN) SOLN 1.25 mg  . Bevacizumab (AVASTIN) SOLN 1.25 mg       Return in about 3 weeks (around  06/19/2020) for f/u PDR OU, DFE, OCT.  There are no Patient Instructions on file for this visit.   Explained the diagnoses, plan, and follow up with the patient and they expressed understanding.  Patient expressed understanding of the importance of proper follow up care.   This document serves as a record of services personally performed by Gardiner Sleeper, MD, PhD. It was created on their behalf by San Jetty. Owens Shark, OA an ophthalmic technician. The creation of this record is the provider's dictation and/or activities during the visit.    Electronically signed by: San Jetty. [place], [place] 09.10.2021 2:14 AM  Gardiner Sleeper, M.D., Ph.D. Diseases & Surgery of the Retina and Vitreous [facility]  I have reviewed the above documentation for accuracy and completeness, and I agree with the above. Gardiner Sleeper, M.D., Ph.D. 05/31/20 2:14 AM   Abbreviations: M myopia (nearsighted); A astigmatism; H hyperopia (farsighted); P presbyopia; Mrx spectacle prescription;  CTL contact lenses; OD right eye; OS left eye; OU both eyes  XT exotropia; ET esotropia; PEK punctate epithelial keratitis; PEE punctate epithelial erosions; DES dry eye syndrome; MGD meibomian gland dysfunction; ATs artificial tears; PFAT's preservative free artificial tears; [place] nuclear sclerotic cataract; PSC posterior subcapsular cataract; ERM epi-retinal membrane; PVD posterior vitreous detachment; RD retinal detachment; DM diabetes mellitus; DR diabetic retinopathy; NPDR non-proliferative diabetic retinopathy; PDR proliferative diabetic retinopathy; CSME clinically significant macular edema; DME diabetic macular edema; dbh dot blot hemorrhages; CWS cotton wool spot; POAG primary open angle glaucoma; C/D cup-to-disc ratio; HVF humphrey visual field; GVF goldmann visual field; OCT optical coherence tomography; IOP intraocular pressure; BRVO Branch retinal vein occlusion; CRVO central retinal vein occlusion; CRAO central  retinal artery occlusion; BRAO branch retinal artery occlusion; RT retinal tear; SB scleral buckle; PPV pars plana vitrectomy; VH Vitreous hemorrhage; PRP panretinal laser photocoagulation; IVK intravitreal kenalog; VMT vitreomacular traction; MH  Macular hole;  NVD neovascularization of the disc; NVE neovascularization elsewhere; AREDS age related eye disease study; ARMD age related macular degeneration; POAG primary open angle glaucoma; EBMD epithelial/anterior basement membrane dystrophy; ACIOL anterior chamber intraocular lens; IOL intraocular lens; PCIOL posterior chamber intraocular lens; Phaco/IOL phacoemulsification with intraocular lens placement; [place] photorefractive keratectomy; LASIK laser assisted in situ keratomileusis; HTN hypertension; DM diabetes mellitus; COPD chronic obstructive pulmonary disease

## 2020-05-31 DIAGNOSIS — H25813 Combined forms of age-related cataract, bilateral: Secondary | ICD-10-CM | POA: Diagnosis not present

## 2020-05-31 DIAGNOSIS — I10 Essential (primary) hypertension: Secondary | ICD-10-CM | POA: Diagnosis not present

## 2020-05-31 DIAGNOSIS — E113513 Type 2 diabetes mellitus with proliferative diabetic retinopathy with macular edema, bilateral: Secondary | ICD-10-CM | POA: Diagnosis not present

## 2020-05-31 DIAGNOSIS — H3581 Retinal edema: Secondary | ICD-10-CM | POA: Diagnosis not present

## 2020-05-31 DIAGNOSIS — H35033 Hypertensive retinopathy, bilateral: Secondary | ICD-10-CM | POA: Diagnosis not present

## 2020-05-31 MED ORDER — BEVACIZUMAB CHEMO INJECTION 1.25MG/0.05ML SYRINGE FOR KALEIDOSCOPE
1.2500 mg | INTRAVITREAL | Status: AC | PRN
  Administered 2020-05-31: 1.25 mg via INTRAVITREAL

## 2020-06-02 ENCOUNTER — Ambulatory Visit: Payer: Medicare HMO

## 2020-06-02 NOTE — Chronic Care Management (AMB) (Signed)
Care Management   Follow Up Note   06/02/2020 Name: [name] MRN: [id number] DOB: [birth year]  Referred by: [name], MD Reason for referral : Chronic Care Management (DM II)   Charles Fox is a 69 y.o. year old male who is a primary care patient of [name], MD. The care management team was consulted for assistance with care management and care coordination needs.    Review of patient status, including review of consultants reports, relevant laboratory and other test results, and collaboration with appropriate care team members and the patient's provider was performed as part of comprehensive patient evaluation and provision of chronic care management services.    SDOH (Social Determinants of Health) assessments performed: No See Care Plan activities for detailed interventions related to [facility])     Advanced Directives: See Care Plan and Vynca application for related entries.   Goals Addressed              This Visit's Progress   .  I don't understand about my diabetes (pt-stated)        CARE PLAN ENTRY (see longtitudinal plan of care for additional care plan information)  Objective:  Lab Results  Component Value Date   HGBA1C 10.4 (A) 02/21/2020 .   Lab Results  Component Value Date   CREATININE 0.68 10/15/2019   CREATININE 0.82 04/11/2019   CREATININE 0.87 03/26/2018 .   [name] No results found for: EGFR  Current Barriers:  [name] Knowledge Deficits related to basic Diabetes pathophysiology and self care/management . Difficulty obtaining or cannot afford medications . Does not have glucometer to monitor blood sugar  Case Manager Clinical Goal(s):  Over the next 90 days, patient will demonstrate improved adherence to prescribed treatment plan for diabetes self care/management as evidenced by: lower his a1c by 1-2 points . daily monitoring and recording of CBG   Interventions:  . Provided education to patient about basic DM disease  process . Reviewed medications with patient and discussed importance of medication adherence . Discussed plans with patient for ongoing care management follow up and provided patient with direct contact information for care management team . Provided patient with written educational materials related to hypo and hyperglycemia and importance of correct treatment . Reviewed scheduled/upcoming provider appointments including: 03/04/20 appointment with [facility] in person for teaching about DM . Collaborated with LCSW [name] in regards to housing and food shortage . Talked with the patient about getting a glucometer to check his blood sugars.  He states that he does not have one he has spoke with his physician about getting one and RNCM has sent a message to see if there is one available.  [name] Spoke with [name] and he stated that he was a little upset.  He states that he is not able to go to his job that he does at the [employer].  He went to the eye doctor on 05-29-20 [facility] had scheduled the patient an appointment with Dr. Katy Fitch on 07-06-20 and given it to LCSW) and they had to work on his eye and would not give him a not to go back. . I expressed to him that he will need to monitor his blood sugar and blood pressure closely.  He didn't seem to understand.  He states that he did not have anything to monitor them with. We reviewed going over his monitor , how to check his blood sugars, the calendar I gave him and the pages he could  write his vales down, he said he did not know where they are.  I encouraged him to find them.  Then I explained how his blood pressure and sugars affect his eyes and other organs in his body.  I let him know that we care about him and want him to stay healthy.  He said that he would try. . I will also talk with Dr Irish Elders about remote health for this patient.  He may benefit from having some one to be able to see him in his environment.                Patient Self  Care Activities:  . UNABLE to independently self manage diabetes  Please see past updates related to this goal by clicking on the "Past Updates" button in the selected goal         The care management team will reach out to the patient again over the next 14 days.   [name] RN, BSN, [facility] Care Management Coordinator [place] Phone: [phone] Fax: [phone]

## 2020-06-02 NOTE — Patient Instructions (Signed)
Visit Information  Goals Addressed              This Visit's Progress   .  I don't understand about my diabetes (pt-stated)        CARE PLAN ENTRY (see longtitudinal plan of care for additional care plan information)  Objective:  Lab Results  Component Value Date   HGBA1C 10.4 (A) 02/21/2020 .   Lab Results  Component Value Date   CREATININE 0.68 10/15/2019   CREATININE 0.82 04/11/2019   CREATININE 0.87 03/26/2018 .   [name] No results found for: EGFR  Current Barriers:  [name] Knowledge Deficits related to basic Diabetes pathophysiology and self care/management . Difficulty obtaining or cannot afford medications . Does not have glucometer to monitor blood sugar  Case Manager Clinical Goal(s):  Over the next 90 days, patient will demonstrate improved adherence to prescribed treatment plan for diabetes self care/management as evidenced by: lower his a1c by 1-2 points . daily monitoring and recording of CBG   Interventions:  . Provided education to patient about basic DM disease process . Reviewed medications with patient and discussed importance of medication adherence . Discussed plans with patient for ongoing care management follow up and provided patient with direct contact information for care management team . Provided patient with written educational materials related to hypo and hyperglycemia and importance of correct treatment . Reviewed scheduled/upcoming provider appointments including: 03/04/20 appointment with [facility] in person for teaching about DM . Collaborated with LCSW [name] in regards to housing and food shortage . Talked with the patient about getting a glucometer to check his blood sugars.  He states that he does not have one he has spoke with his physician about getting one and RNCM has sent a message to see if there is one available.  [name] Spoke with Mr Kerce and he stated that he was a little upset.  He states that he is not able to go to his job that he does  at the [employer].  He went to the eye doctor on 05-29-20 [facility] had scheduled the patient an appointment with Dr. Katy Fitch on 07-06-20 and given it to LCSW) and they had to work on his eye and would not give him a not to go back. . I expressed to him that he will need to monitor his blood sugar and blood pressure closely.  He didn't seem to understand.  He states that he did not have anything to monitor them with. We reviewed going over his monitor , how to check his blood sugars, the calendar I gave him and the pages he could write his vales down, he said he did not know where they are.  I encouraged him to find them.  Then I explained how his blood pressure and sugars affect his eyes and other organs in his body.  I let him know that we care about him and want him to stay healthy.  He said that he would try. . I will also talk with Dr Irish Elders about remote health for this patient.  He may benefit from having some one to be able to see him in his environment.                Patient Self Care Activities:  . UNABLE to independently self manage diabetes  Please see past updates related to this goal by clicking on the "Past Updates" button in the selected goal         Mr. Escoe was  given information about Care Management services today including:  1. Care Management services include personalized support from designated clinical staff supervised by his physician, including individualized plan of care and coordination with other care providers 2. 24/7 contact phone numbers for assistance for urgent and routine care needs. 3. The patient may stop CCM services at any time (effective at the end of the month) by phone call to the office staff.  Patient agreed to services and verbal consent obtained.   The patient verbalized understanding of instructions provided today and declined a print copy of patient instruction materials.   The care management team will reach out to the patient again  over the next 14 days.   [name] RN, BSN, [facility] Care Management Coordinator [place] Phone: [phone] Fax: [phone]

## 2020-06-03 ENCOUNTER — Ambulatory Visit: Payer: Medicare HMO | Admitting: Licensed Clinical Social Worker

## 2020-06-03 ENCOUNTER — Ambulatory Visit: Payer: Self-pay

## 2020-06-03 ENCOUNTER — Telehealth: Payer: Self-pay | Admitting: Family Medicine

## 2020-06-03 ENCOUNTER — Ambulatory Visit: Payer: Medicare HMO

## 2020-06-03 DIAGNOSIS — F0390 Unspecified dementia without behavioral disturbance: Secondary | ICD-10-CM

## 2020-06-03 DIAGNOSIS — E119 Type 2 diabetes mellitus without complications: Secondary | ICD-10-CM

## 2020-06-03 DIAGNOSIS — H547 Unspecified visual loss: Secondary | ICD-10-CM

## 2020-06-03 NOTE — Telephone Encounter (Signed)
   SF 06/03/2020   Name: Charles Fox   MRN: [id number]   DOB: 02-15-1951   AGE: 68 y.o.   GENDER: male   PCP [name], MD.   Called pt regarding Community Resource Referral for transportation. Informed patient that Care Guide has sent a referral to [place] to schedule a round-trip ride for him tomorrow 06/04/2020 for his appointment at [facility]. Explained to patient that once the ride has been booked he will receive a call from [place] to confirm the ride. Patient stated understanding.    [place], Care Management Phone: [phone] Email: [email or web address]

## 2020-06-03 NOTE — Telephone Encounter (Signed)
Completed General Intake form for Mr. Gales for [place]. Sent e-mail to transportation services, waiting to receive confirmation that ride has been scheduled.   [email or web address] [email or web address]> [email or web address]  SECURE: General Intake Form for Mr. Ladora Daniel  Hi,   Attached is the General Intake Form for Mr. Ladora Daniel. He has an appointment at [facility] at [facility], June 04, 2020. He will need a round-trip ride to and from his appointment.  Patient will also need the driver to provide a card or piece of paper with contact information so that he can remember to call once his visit is completed.   Thanks,   [place], Care Management Phone: [phone] Email: [email or web address]

## 2020-06-03 NOTE — Chronic Care Management (AMB) (Signed)
°  Care Management     Care Management Outreach Note  06/03/2020 Name: Charles Fox MRN: [id number] DOB: August 03, 1951  Charles Fox is a 69 y.o. year old male who is a primary care patient of [name], MD .  [facility] received a call from [facility] at Dr. Zenia Resides office # [phone] on [phone] [address].  She informed me that the patient has an appointment at their office on 06/04/20 at 2 pm to have the pressure in his eyes checked.  Collaborated with LCSW Laurance Flatten and she will call the patient and remind him of this information.  Follow Up Plan:RNCM will call the patient at the next scheduled interval  [name] RN, BSN, [facility] Care Management Coordinator [place] Phone: [phone] Fax: [phone]

## 2020-06-03 NOTE — Chronic Care Management (AMB) (Signed)
   RNCM Care Management Collaboration 06/03/2020 Name: Charles Fox MRN: [id number] DOB: 1951-02-12   Charles Fox is a 69 y.o. year old male who sees [name], MD for primary care. RNCM was collaborated with Dr. Irish Elders to assistance the  patient with  Care Coordination.     Recommendation: After collaboration with Dr Jeannine Kitten it is determined that patient may benefit from Remote Health services.   Intervention: Patient was not interviewed or contacted during this encounter.  RNCM collaborated with LCSW Review of patient status, including review of consultants reports, relevant laboratory and other test results, and collaboration with appropriate care team members and the patient's provider was performed as part of comprehensive patient evaluation and provision of chronic care management services.       Plan:  1. LCSW will follow up with the patient to inform him about Remote Health if he would allow them to come if accepted and the patient agreed. 2.    RNCM filled the paperwork out let the PCP review and sign.  The paper was faxed to Remote health waiting for a response.   [name] RN, BSN, [facility] Care Management Coordinator [place] Phone: [phone] Fax: [phone]

## 2020-06-03 NOTE — Chronic Care Management (AMB) (Signed)
Care Management   Clinical Social Work Follow Up   06/03/2020 Name: Charles Fox MRN: [id number] DOB: [birth year] Referred by: Charles Pike, MD  Reason for referral : Charles Fox is a 69 y.o. year old male who is a primary care patient of Charles Pike, MD.  Reason for follow-up: assess for barriers and progress with care plan .    Assessment: Patient continues to experience barrier with care due to forgetting. Arrangements made for transportation to eye appointment tomorrow.  See plan of care below. Plan: LCSW will F/U with patient as needed  Advance Directive Status: not addressed during this encounter. Previously declined SDOH (Social Determinants of Health) assessments performed:  No new needs identified   Goals Addressed            This Visit's Progress   . Speciality Appointments   On track    [place] (see longitudinal plan of care for additional care plan information)  Current Barriers:  . Patient needs to get to multi speciality appointments and having difficulty navigating   . Patient needs to go to the eye doctor and [place] (appointment Sept 17th at 3:10)needs to arrive by 2:45 . Transportation arranged for patient's appointment via [facility] . Patient acknowledges deficits and needs support, education and care coordination in order to meet this unmet need  . Memory Deficits; and concerns about patient driving (due to eyes) . Call from [place] care called patient has an eye appointment tomorrow at 2:00 Clinical Goal(s)  Over the next 30 days, patient will work with CCM Team to get to speciality appointments Interventions provided by LCSW:  . Assessment of needs and barriers to care as well as how impacting    . Discussed with patient about importance of appointments; reminded him of appointment with [facility] care and [facility] . Will continue to collaborate with RN Case Manager ; Referral sent  to [facility] care guide for transportation to [facility]. Nash Dimmer with primary care provider re: ongoing needs and barrier . Contacted insurance provider to verify patient's insurance and transportation services. Patient has [facility] and they will transport to appointments . Letter mailed to patient informing him of appointment and transportation. ( patient states he did not receive letter for imaging appointment) . Discuss options for Remote health with patient.  Provided verbal permission to CCM RN and PCP to make referral Patient Self Care Activities & Deficits:  . Patient is unable to independently navigate community resource options without care coordination support  . Patient is unable to contact agencies or set up transportation as this has been an ongoing barrier   Please see past updates related to this goal by clicking on the "Past Updates" button in the selected goal       Outpatient Encounter Medications as of 06/03/2020  Medication Sig  . Alcohol Swabs PADS He is to check blood glucose 2-3 times a day  . aspirin 81 MG tablet Take 1 tablet (81 mg total) by mouth daily.  [name] atorvastatin (LIPITOR) 40 MG tablet Take 1 tablet (40 mg total) by mouth daily.  . blood glucose meter kit and supplies KIT Dispense based on patient and insurance preference. Use up to four times daily as directed. (FOR ICD-9 250.00, 250.01). (Patient not taking: Reported on 03/04/2020)  . Blood Glucose Monitoring Suppl (ONETOUCH VERIO) w/Device KIT by Does not apply route.  [name] glucose blood (ONETOUCH VERIO) test  strip Please use to check blood glucose once in the AM while fasting prior to giving insulin.  [name] insulin degludec (TRESIBA FLEXTOUCH) 100 UNIT/ML FlexTouch Pen Inject 0.3 mLs (30 Units total) into the skin daily.  . Insulin Syringe-Needle U-100 (INSULIN SYRINGE .5CC/30GX1/2") 30G X 1/2" 0.5 ML MISC Use with glargine. Dispose after each use.  . losartan (COZAAR) 50 MG tablet Take 1 tablet (50 mg  total) by mouth at bedtime.  . metFORMIN (GLUCOPHAGE-XR) 500 MG 24 hr tablet Take 1 tablet (500 mg total) by mouth 2 (two) times daily with a meal.  . OneTouch Delica Lancets 49Q MISC Please use to check blood glucose once in the AM while fasting prior to giving insulin.   No facility-administered encounter medications on file as of 06/03/2020.   Review of patient status, including review of consultants reports, relevant laboratory and other test results, and collaboration with appropriate care team members and the patient's provider was performed as part of comprehensive patient evaluation and provision of care management services.    [name], [place] / [place]   [phone] 2:57 PM

## 2020-06-04 NOTE — Telephone Encounter (Signed)
Received e-mail from [employer] that the ride for Charles Fox has been confirmed and  scheduled for 06/04/2020.   Closing referral pending any other needs of patient.  From: Transportation Services [email or web address]> To:      Audelia [email or web address]> RE:    SECURE: General Intake Form for Charles Fox   Good morning,  All is set up and confirmed with patient. Please see below

## 2020-06-05 ENCOUNTER — Ambulatory Visit: Payer: Medicare HMO | Admitting: Licensed Clinical Social Worker

## 2020-06-05 ENCOUNTER — Encounter: Payer: Self-pay | Admitting: Licensed Clinical Social Worker

## 2020-06-05 ENCOUNTER — Ambulatory Visit
Admission: RE | Admit: 2020-06-05 | Discharge: 2020-06-05 | Disposition: A | Discharge status: Home / Self Care | Payer: Medicare HMO | Source: Ambulatory Visit | Attending: Family Medicine | Admitting: Family Medicine

## 2020-06-05 ENCOUNTER — Ambulatory Visit: Payer: Medicare HMO

## 2020-06-05 DIAGNOSIS — S0003XA Contusion of scalp, initial encounter: Secondary | ICD-10-CM | POA: Diagnosis not present

## 2020-06-05 DIAGNOSIS — Z7189 Other specified counseling: Secondary | ICD-10-CM

## 2020-06-05 DIAGNOSIS — F0390 Unspecified dementia without behavioral disturbance: Secondary | ICD-10-CM

## 2020-06-05 DIAGNOSIS — R9082 White matter disease, unspecified: Secondary | ICD-10-CM | POA: Diagnosis not present

## 2020-06-05 DIAGNOSIS — R4182 Altered mental status, unspecified: Secondary | ICD-10-CM | POA: Diagnosis not present

## 2020-06-05 DIAGNOSIS — R6889 Other general symptoms and signs: Secondary | ICD-10-CM | POA: Diagnosis not present

## 2020-06-05 NOTE — Chronic Care Management (AMB) (Signed)
Care Management   Clinical Social Work Follow Up   06/05/2020 Name: Charles Fox MRN: [id number] DOB: Feb 13, 1951 Referred by: [name], MD  Reason for referral : Care Coordination (F/U)  Charles Fox is a 69 y.o. year old male who is a primary care patient of [name], MD.  Reason for follow-up: assess for barriers and progress with care plan .   Assessment: Patient continues to experience confusion and difficulty with managing medical appointments.  Look under history for updated information obtained from patient.  Patient has no support system and it has been difficult working with him on compliance.  Patient in agreement to go to imaging appointment today.  [employer] transportation will  Transport patient to appointment.   Plan: CCM RN and CCM LCSW will continue to work with patient for ongoing needs.  Will call patient next week for appointment reminder on Sept 23rd. Advance Directive Status: N ; not addressed during this encounter  SDOH (Social Determinants of Health) assessments performed: No needs identified   Goals Addressed            This Visit's Progress   . Speciality Appointments       CARE PLAN ENTRY (see longitudinal plan of care for additional care plan information)  Current Barriers:  . Patient needs to get to multi speciality appointments and having difficulty navigating   . Patient needs to go to the eye doctor and [place] (appointment Sept 17th at 3:10)needs to arrive by 2:45 . Transportation arranged for patient's appointment via [facility] . Patient acknowledges deficits and needs support, education and care coordination in order to meet this unmet need  . Memory Deficits; and concerns about patient driving (due to eyes) . Patient reports he did not go to [facility] care appointment as [facility] did not come to pick him up . Patient reports he does not want to go to [facility] appointment today Clinical Goal(s)  Over the next  30 days, patient will work with CCM Team to get to speciality appointments Interventions provided by LCSW:  . Assessment of needs and barriers to care as well as how impacting    . Discussed with patient about importance of appointments; reminded patient of appointment with [facility], made sure he had phone number to call when appointment is over . Education to patient on why he needs to keep imagine appointment. Education provided . Will continue to collaborate with RN Case Manager for patient's needs /talking with patient on 3 way conference call with CCM RN;  . Collaborated with primary care provider re: ongoing needs and barrier . Contacted insurance provider to verify patient's insurance and transportation services. Patient has [facility] and they will transport to appointments . Letter mailed to patient informing him of appointment and transportation. ( patient states he did not receive letter for imaging appointment) Patient Self Care Activities & Deficits:  . Patient is unable to independently navigate community resource options without care coordination support  . Patient is unable to contact agencies or set up transportation as this has been an ongoing barrier   Please see past updates related to this goal by clicking on the "Past Updates" button in the selected goal       Outpatient Encounter Medications as of 06/05/2020  Medication Sig  . Alcohol Swabs PADS He is to check blood glucose 2-3 times a day  . aspirin 81 MG tablet Take 1 tablet (81 mg total) by mouth daily.  [name]  atorvastatin (LIPITOR) 40 MG tablet Take 1 tablet (40 mg total) by mouth daily.  . blood glucose meter kit and supplies KIT Dispense based on patient and insurance preference. Use up to four times daily as directed. (FOR ICD-9 250.00, 250.01). (Patient not taking: Reported on 03/04/2020)  . Blood Glucose Monitoring Suppl (ONETOUCH VERIO) w/Device KIT by Does not apply route.  [name] glucose blood (ONETOUCH  VERIO) test strip Please use to check blood glucose once in the AM while fasting prior to giving insulin.  [name] insulin degludec (TRESIBA FLEXTOUCH) 100 UNIT/ML FlexTouch Pen Inject 0.3 mLs (30 Units total) into the skin daily.  . Insulin Syringe-Needle U-100 (INSULIN SYRINGE .5CC/30GX1/2") 30G X 1/2" 0.5 ML MISC Use with glargine. Dispose after each use.  . losartan (COZAAR) 50 MG tablet Take 1 tablet (50 mg total) by mouth at bedtime.  . metFORMIN (GLUCOPHAGE-XR) 500 MG 24 hr tablet Take 1 tablet (500 mg total) by mouth 2 (two) times daily with a meal.  . OneTouch Delica Lancets 01S MISC Please use to check blood glucose once in the AM while fasting prior to giving insulin.   No facility-administered encounter medications on file as of 06/05/2020.   Review of patient status, including review of consultants reports, relevant laboratory and other test results, and collaboration with appropriate care team members and the patient's provider was performed as part of comprehensive patient evaluation and provision of care management services.    [name], [place] / [place]   [phone] 1:10 PM

## 2020-06-05 NOTE — Chronic Care Management (AMB) (Signed)
Social Work  Care Management Collaboration 06/05/2020 Name: Charles Fox MRN: [id number] DOB: [birth year] Charles Fox is a 69 y.o. year old male who sees [name], MD for primary care.    LCSW received call from [place]: patient confused and not sure how to get home.  He thinks he is to ride the bus home. Intervention: Patient was not interviewed or contacted during this encounter.  LCSW collaborated with CCM RN,  Lobbyist at [employer] and [employer] in order to get patient home.  After collaboration with CCM RN it it determined that patient may benefit from [facility] RN and [facility] Social Work for a closer look at his home environment and medication compliance.  Plan:   1. Note sent to PCP to place orders 2. Patient will come to [facility] clinic Sept. 23rd  Review of patient status, including review of consultants reports, relevant laboratory and other test results, and collaboration with appropriate care team members and the patient's provider was performed as part of comprehensive patient evaluation and provision of chronic care management services.    Goals Addressed            This Visit's Progress   . Speciality Appointments       CARE PLAN ENTRY (see longitudinal plan of care for additional care plan information)  Current Barriers:  . Patient needs to get to multi speciality appointments and having difficulty navigating   . Patient needs to go to the eye doctor and [place] (appointment Sept 17th at 3:10)needs to arrive by 2:45 . Transportation arranged for patient's appointment via [facility] . Patient acknowledges deficits and needs support, education and care coordination in order to meet this unmet need  . Memory Deficits; and concerns about patient driving (due to eyes) . Patient reports he did not go to [facility] care appointment as [facility] did not come to pick him up . Patient reports he does not want to go to [facility]  appointment today Clinical Goal(s)  Over the next 30 days, patient will work with CCM Team to get to speciality appointments Interventions provided by LCSW:  . Assessment of needs and barriers to care as well as how impacting    . Discussed with patient about importance of appointments; reminded patient of appointment with [facility], made sure he had phone number to call when appointment is over . Education to patient on why he needs to keep imagine appointment. Education provided . Will continue to collaborate with RN Case Manager for patient's needs /talking with patient on 3 way conference call with CCM RN;  . Collaborated with primary care provider re: ongoing needs and barrier . Contacted insurance provider to verify patient's insurance and transportation services. Patient has [facility] and they will transport to appointments . Letter mailed to patient informing him of appointment and transportation. ( patient states he did not receive letter for imaging appointment) Patient Self Care Activities & Deficits:  . Patient is unable to independently navigate community resource options without care coordination support  . Patient is unable to contact agencies or set up transportation as this has been an ongoing barrier   Please see past updates related to this goal by clicking on the "Past Updates" button in the selected goal     [name], [place] / [place]   [phone] 3:35 PM

## 2020-06-08 ENCOUNTER — Other Ambulatory Visit: Payer: Self-pay | Admitting: Family Medicine

## 2020-06-08 DIAGNOSIS — F0280 Dementia in other diseases classified elsewhere without behavioral disturbance: Secondary | ICD-10-CM

## 2020-06-08 NOTE — Patient Instructions (Signed)
Visit Information  Goals Addressed              This Visit's Progress   .  I can't remember my ride home (pt-stated)        [place] (see longitudinal plan of care for additional care plan information)  Current Barriers:  . Lacks caregiver support.- patient when asked states that he  has no one that he can put down as an emergency contact. . Cognitive Deficits - related to current diagnosis of dementia- Care management team was contacted by radiology due to the patient could not remember how he would get home from his test.  Nurse Case Manager Clinical Goal(s):  [name] Over the next 30 days, patient will work with Care management team to address needs related to Care  Interventions:  . Inter-disciplinary care team collaboration (see longitudinal plan of care) . Collaborated with LCSW regarding patient ongoing needs . Discussed plans with patient for ongoing care management follow up and provided patient with direct contact information for care management team . Talked with the patient on 3-way with LCSW to go over appointment for CT scan ,explaining what is was for and why he needed to keep the appointment.  . Reviewed scheduled/upcoming provider appointments including: 06/11/20 [facility]  Patient Self Care Activities:  . Patient verbalizes understanding of plan  . Unable to independently manage transportation resources   Initial goal documentation     .  I don't understand about my diabetes (pt-stated)        CARE PLAN ENTRY (see longtitudinal plan of care for additional care plan information)  Objective:  Lab Results  Component Value Date   HGBA1C 10.4 (A) 02/21/2020 .   Lab Results  Component Value Date   CREATININE 0.68 10/15/2019   CREATININE 0.82 04/11/2019   CREATININE 0.87 03/26/2018 .   [name] No results found for: EGFR  Current Barriers:  [name] Knowledge Deficits related to basic Diabetes pathophysiology and self care/management . Difficulty obtaining or  cannot afford medications . Does not have glucometer to monitor blood sugar  Case Manager Clinical Goal(s):  Over the next 90 days, patient will demonstrate improved adherence to prescribed treatment plan for diabetes self care/management as evidenced by: lower his a1c by 1-2 points . daily monitoring and recording of CBG   Interventions:  . Provided education to patient about basic DM disease process . Reviewed medications with patient and discussed importance of medication adherence . Discussed plans with patient for ongoing care management follow up and provided patient with direct contact information for care management team . Provided patient with written educational materials related to hypo and hyperglycemia and importance of correct treatment . Reviewed scheduled/upcoming provider appointments including: 03/04/20 appointment with [facility] in person for teaching about DM . Collaborated with LCSW Charles Fox in regards to housing and food shortage . Talked with the patient about getting a glucometer to check his blood sugars.  He states that he does not have one he has spoke with his physician about getting one and RNCM has sent a message to see if there is one available.  . Received a call from remote health and the patient does not meet criteria.  RNCM has sent a message to sent a message to the PCP to write a referral for Home health evaluation.     Patient Self Care Activities:  . UNABLE to independently self manage diabetes  Please see past updates related to this goal by clicking on the "  Past Updates" button in the selected goal         Charles Fox was given information about Care Management services today including:  1. Care Management services include personalized support from designated clinical staff supervised by his physician, including individualized plan of care and coordination with other care providers 2. 24/7 contact phone numbers for assistance for urgent and  routine care needs. 3. The patient may stop CCM services at any time (effective at the end of the month) by phone call to the office staff.  Patient agreed to services and verbal consent obtained.   The patient verbalized understanding of instructions provided today and declined a print copy of patient instruction materials.    RNCM will follow up with the patient within the next month of September   Charles Bowditch RN, BSN, [facility] Care Management Coordinator [place] Phone: [phone] Fax: [phone]

## 2020-06-08 NOTE — Chronic Care Management (AMB) (Signed)
Care Management   Follow Up Note   06/08/2020 Name: Charles Fox MRN: [id number] DOB: [birth year]  Referred by: [name], MD Reason for referral : Chronic Care Management (Appointments)   Charles Fox is a 69 y.o. year old male who is a primary care patient of [name], MD. The care management team was consulted for assistance with care management and care coordination needs.    Review of patient status, including review of consultants reports, relevant laboratory and other test results, and collaboration with appropriate care team members and the patient's provider was performed as part of comprehensive patient evaluation and provision of chronic care management services.    SDOH (Social Determinants of Health) assessments performed: No See Care Plan activities for detailed interventions related to [employer])     Advanced Directives: See Care Plan and Vynca application for related entries.   Goals Addressed              This Visit's Progress   .  I can't remember my ride home (pt-stated)        [place] (see longitudinal plan of care for additional care plan information)  Current Barriers:  . Lacks caregiver support.- patient when asked states that he  has no one that he can put down as an emergency contact. . Cognitive Deficits - related to current diagnosis of dementia- Care management team was contacted by radiology due to the patient could not remember how he would get home from his test.  Nurse Case Manager Clinical Goal(s):  [name] Over the next 30 days, patient will work with Care management team to address needs related to Care  Interventions:  . Inter-disciplinary care team collaboration (see longitudinal plan of care) . Collaborated with LCSW regarding patient ongoing needs . Discussed plans with patient for ongoing care management follow up and provided patient with direct contact information for care management team . Talked with the patient on  3-way with LCSW to go over appointment for CT scan ,explaining what is was for and why he needed to keep the appointment.  . Reviewed scheduled/upcoming provider appointments including: 06/11/20 [facility]  Patient Self Care Activities:  . Patient verbalizes understanding of plan  . Unable to independently manage transportation resources   Initial goal documentation     .  I don't understand about my diabetes (pt-stated)        CARE PLAN ENTRY (see longtitudinal plan of care for additional care plan information)  Objective:  Lab Results  Component Value Date   HGBA1C 10.4 (A) 02/21/2020 .   Lab Results  Component Value Date   CREATININE 0.68 10/15/2019   CREATININE 0.82 04/11/2019   CREATININE 0.87 03/26/2018 .   [name] No results found for: EGFR  Current Barriers:  [name] Knowledge Deficits related to basic Diabetes pathophysiology and self care/management . Difficulty obtaining or cannot afford medications . Does not have glucometer to monitor blood sugar  Case Manager Clinical Goal(s):  Over the next 90 days, patient will demonstrate improved adherence to prescribed treatment plan for diabetes self care/management as evidenced by: lower his a1c by 1-2 points . daily monitoring and recording of CBG   Interventions:  . Provided education to patient about basic DM disease process . Reviewed medications with patient and discussed importance of medication adherence . Discussed plans with patient for ongoing care management follow up and provided patient with direct contact information for care management team . Provided patient with written educational  materials related to hypo and hyperglycemia and importance of correct treatment . Reviewed scheduled/upcoming provider appointments including: 03/04/20 appointment with [facility] in person for teaching about DM . Collaborated with LCSW [name] in regards to housing and food shortage . Talked with the patient about getting a glucometer to  check his blood sugars.  He states that he does not have one he has spoke with his physician about getting one and RNCM has sent a message to see if there is one available.  . Received a call from remote health and the patient does not meet criteria.  RNCM has sent a message to sent a message to the PCP to write a referral for Home health evaluation.     Patient Self Care Activities:  . UNABLE to independently self manage diabetes  Please see past updates related to this goal by clicking on the "Past Updates" button in the selected goal          RNCM will follow up with the patient within the next month of September   Grove Defina RN, BSN, [place] Phone: [phone] Fax: [phone]

## 2020-06-10 ENCOUNTER — Ambulatory Visit: Payer: Medicare HMO | Admitting: Licensed Clinical Social Worker

## 2020-06-10 DIAGNOSIS — Z7189 Other specified counseling: Secondary | ICD-10-CM

## 2020-06-10 NOTE — Chronic Care Management (AMB) (Signed)
    Clinical Social Work  Care Management Outreach   06/10/2020 Name: Charles Fox MRN: [id number] DOB: December 04, 1950  Charles Fox is a 69 y.o. year old male who is a primary care patient of Charles Pike, MD .   LCSW reached out to Charles Fox to remind him of Charles Fox appointment. Intervention: reminded patient to bring all of his medications, collaborated with CCM RN and PCP Plan: LCSW will participate in Charles Fox assess for ongoing needs  Review of patient status, including review of consultants reports, relevant laboratory and other test results, and collaboration with appropriate care team members and the patient's provider was performed as part of comprehensive patient evaluation and provision of care management services.    Charles Fox, Charles Fox   [phone] 4:08 PM

## 2020-06-11 ENCOUNTER — Ambulatory Visit: Payer: Medicare HMO | Admitting: Licensed Clinical Social Worker

## 2020-06-11 ENCOUNTER — Ambulatory Visit (INDEPENDENT_AMBULATORY_CARE_PROVIDER_SITE_OTHER): Payer: Medicare HMO | Admitting: Family Medicine

## 2020-06-11 ENCOUNTER — Ambulatory Visit: Payer: Medicare HMO

## 2020-06-11 ENCOUNTER — Other Ambulatory Visit: Payer: Self-pay

## 2020-06-11 DIAGNOSIS — Z0189 Encounter for other specified special examinations: Secondary | ICD-10-CM

## 2020-06-11 DIAGNOSIS — Z8673 Personal history of transient ischemic attack (TIA), and cerebral infarction without residual deficits: Secondary | ICD-10-CM | POA: Diagnosis not present

## 2020-06-11 DIAGNOSIS — Z7189 Other specified counseling: Secondary | ICD-10-CM

## 2020-06-11 NOTE — Progress Notes (Signed)
S: Pharmacy consulted to complete medication reconciliation for geriatric clinic. Patient arrives in good spirits today. Patient has a history of non-compliance. Reports taking two tablets of aspirin 81 mg daily for back pain, Tresiba 30 units in the evenings, and metformin XR 500 mg once in the mornings. Reports not taking atorvastatin due to being out of medication for "a couple of days" and does not recall taking losartan for blood pressure management. Denies NSAID use and vitamins. Reports trouble affording medications - has insulin and metformin at home, however states he cannot afford aspirin and atorvastatin copay.    Medication list   Losartan 50 mg daily (not taking)  Metformin XR 500 mg BID (taking once in the morning)  Tresiba 30 units daily  Atorvastatin 40 mg daily (not taking)  Aspirin 81 mg daily (not taking)   A/P: Discussed the importance of medication adherence. Contacted patient's pharmacy to determine copay cost of medications - Atorvastatin $0.74, losartan $0.84, and Tresiba $45 for a 30-day supply. Spoke to [facility] who will provide funds to patient to afford medications for this month. No recommendations for changes in his medications.  Lorel Monaco, PharmD PGY2 Ambulatory Care Resident [place]

## 2020-06-11 NOTE — Progress Notes (Addendum)
Geriatrics Visit Note  Provider: Addison Naegeli, MD Location of Care: [facility]  Patient is accompanied by  No one.  Primary caregiver is patient. Patient's lives alone. Patient information was obtained from patient. History/Exam limitations: dementia.   The family and the patient identify problems with changes in short term memory and getting disoriented outside of familiar environment.  Family and patient are concerned about  medication errors and driving, however, they are not concerned about wandering and inability to maintain adequate nutrition.  Chief Complaint:  Chief Complaint  Patient presents with  . memory concerns    HISTORY OF PRESENT ILLNESS: Charles Fox is a 69 y.o. 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 He lives alone and does not have close relationships with any family member, including his children.  He states he has not been able to work recently because of issues with his vision.  He endorses compliance with his medications, although refill histories contradict this.  He is unsure of the purpose of this visit but knows it is related to the test he took a few weeks ago where he 'had to draw a clock'.    Outpatient Encounter Medications as of 06/11/2020  Medication Sig  . insulin degludec (TRESIBA FLEXTOUCH) 100 UNIT/ML FlexTouch Pen Inject 0.3 mLs (30 Units total) into the skin daily.  . metFORMIN (GLUCOPHAGE-XR) 500 MG 24 hr tablet Take 1 tablet (500 mg total) by mouth 2 (two) times daily with a meal.  . Alcohol Swabs PADS He is to check blood glucose 2-3 times a day  . aspirin 81 MG tablet Take 1 tablet (81 mg total) by mouth daily. (Patient not taking: Reported on 06/11/2020)  . atorvastatin (LIPITOR) 40 MG tablet Take 1 tablet (40 mg total) by mouth daily. (Patient not taking: Reported on 06/11/2020)  . blood glucose meter kit and supplies KIT Dispense based on patient  and insurance preference. Use up to four times daily as directed. (FOR ICD-9 250.00, 250.01). (Patient not taking: Reported on 03/04/2020)  . Blood Glucose Monitoring Suppl (ONETOUCH VERIO) w/Device KIT by Does not apply route.  [name] glucose blood (ONETOUCH VERIO) test strip Please use to check blood glucose once in the AM while fasting prior to giving insulin.  . Insulin Syringe-Needle U-100 (INSULIN SYRINGE .5CC/30GX1/2") 30G X 1/2" 0.5 ML MISC Use with glargine. Dispose after each use.  . losartan (COZAAR) 50 MG tablet Take 1 tablet (50 mg total) by mouth at bedtime. (Patient not taking: Reported on 06/11/2020)  . OneTouch Delica Lancets 56C MISC Please use to check blood glucose once in the AM while fasting prior to giving insulin.   No facility-administered encounter medications on file as of 06/11/2020.     History Past Medical History:  Diagnosis Date  . Diabetes mellitus without complication ([place])   . Hypertension    Past Surgical History:  Procedure Laterality Date  . BACK SURGERY    . FINGER SURGERY     No family history on file.  reports that he has been smoking cigarettes. He has been smoking about 0.50 packs per day. He has never used smokeless tobacco. He reports previous drug use. He reports that he does not drink alcohol.  Activities of Daily Living - Current Assessment walk without assistance eating, bathing, toileting, ambulating, grooming, dressing upper body and dressing lower body  Medication administration: patient self medicates.  Overall functional status:  Self-care  Comments:  Patient  lives alone with no support system.  Falls in the past six months:  0  Diet:  general Supplemental shakes: no    Vitals:   06/11/20 1413  BP: 128/78  Pulse: 84  SpO2: 95%   Filed Weights   06/11/20 1413  Weight: 270 lb (122.5 kg)    Review of Systems Pertinent items are noted in HPI.     Review of Systems  General: Denies fevers, chills, weight loss,  fatigue Eyes: Denies pain, blurred vision, discharge.  Ears/Nose/Throat: Denies ear pain, throat pain, rhinorrhea, nasal congestion.  Cardiovascular: Denies chest pains, palpitations, dyspnea on exertion, orthopnea, peripheral edema.  Respiratory: Denies cough, sputum, dyspnea.  Gastrointestinal: Denies abd pain, bloating, constipation, diarrhea.  Genitourinary: Denies dysuria, urinary frequency, discharge, pelvic pain.  Musculoskeletal: Denies joint pain, swelling, weakness.  Skin: Denies skin rash or ulcers. Neurologic: Denies transient paralysis, weakness, paresthesias, headache.  Psychiatric: Denies depression, anxiety, psychosis. Endocrine: Denies weight change.   PHYSICAL EXAM: BP 128/78   Pulse 84   Wt 270 lb (122.5 kg)   SpO2 95%   BMI 41.05 kg/m  General appearance: alert, appears older than stated age and becomes increasingly agitated as exam progresses Lungs: clear to auscultation bilaterally Heart: regular rate and rhythm, S1, S2 normal, no murmur, click, rub or gallop Abdomen: soft, non-tender; bowel sounds normal; no masses,  no organomegaly Neurologic: Motor: grossly normal Coordination: Romberg test normal Gait: normal gait.  difficulty in cooperating with placing feet heel to toe.    Psych: patient generally in a joking mood, but easily becomes agitated if he feels threatened.     Assessment and Plan:   Problem List Items Addressed This Visit      Other   History of lacunar cerebrovascular accident     Patient is a 69 yo male who was recently diagnosed with dementia, currently lives alone with no social support system. Patient has had difficulty with management of his medical illnesses and medication compliance, though patient denies difficulty managing medications.  Patient has been working with social work to help coordinate medical visits and establish with community resources to provide assistance, although this has been difficult due to patient  noncompliance with assistance.  Goal of visit today is to assist with patient's medication needs, identify support structure (patient has three children whom he denies having close relationship with), and try to eliminate barriers of care. Social work is in the process of setting up home health nurse visits so that someone can assess his current living conditions.    Code Status:  full   First contact:  None listed  Follow Up Appointment:  10/12

## 2020-06-12 NOTE — Chronic Care Management (AMB) (Signed)
Care Management Clinical Social Work  [facility] Psychosocial     06/12/2020 Name: Charles Fox MRN: [id number] DOB: 1950/12/03  Charles Fox is a 69 y.o. year old male who sees [name], MD for primary care.  Referred by Dr. PCP for ongoing assessment of psychosocial needs, support and barriers. (Look under patient's history for social information from assessment). Patient came to the appointment alone. He reports not having a support system   SDOH (Social Determinants of Health) assessments performed: Yes: very difficult to determine if patient's needs as he consistently changes his story when asked about finances, medication and food.  Patient states he has enough money to purchase medication that is ready for pick up at [facility].  Pharmacy team called to verify cost.  Advance Directive Status: Patient does not want to discuss this.  Intervention:Patient interviewed and appropriate assessments performed:Collaborated with PCP, CCM RN and pharmacy [facility] team) for ongoing option with patient.   . Provided patient with information about cost of his medication which is less than $2.00; Patient reports he has enough money to get the medication . Advised patient to pick up medication when he left office Recommendation: Patient may benefit from, and is in agreement to [place] and Social Work.  Patient needs an assessment of his home situation and medication monitoring support in his home. Plan:   1. Order placed by PCP for [facility] 2. CCM RN will F/U with patient in 2 days 3. CCM LCSW will continue to collaborate with team for ongoing patient needs  Goals Addressed            This Visit's Progress   . Speciality Appointments   On track    [place] (see longitudinal plan of care for additional care plan information)  Current Barriers:  . Patient needs to get to multi speciality appointments and having difficulty navigating   . Patient needs to go to the eye doctor  and has F/U appointment Sept 27th  . Patient acknowledges deficits and needs support, education and care coordination in order to meet this unmet need  . Memory Deficits;  Clinical Goal(s)  Over the next 30 days, patient will work with CCM Team to get to speciality appointments Interventions provided by LCSW:  . Assessment of needs and barriers to care as well as how impacting    . Will continue to collaborate with RN Case Manager for patient's needs  . Collaborated with primary care provider re: ongoing needs and barrier Patient Self Care Activities & Deficits:  . Patient is unable to independently navigate community resource options without care coordination support  . Patient is unable to contact agencies or set up transportation as this has been an ongoing barrier   Please see past updates related to this goal by clicking on the "Past Updates" button in the selected goal       Review of patient status, including review of consultants reports, relevant laboratory and other test results, and collaboration with appropriate care team members and the patient's provider was performed as part of comprehensive patient evaluation and provision of chronic care management services.   Outpatient Encounter Medications as of 06/11/2020  Medication Sig Note  . Alcohol Swabs PADS He is to check blood glucose 2-3 times a day   . aspirin 81 MG tablet Take 1 tablet (81 mg total) by mouth daily. (Patient not taking: Reported on 06/11/2020) 06/11/2020: Taking two pills  . atorvastatin (LIPITOR) 40 MG  tablet Take 1 tablet (40 mg total) by mouth daily. (Patient not taking: Reported on 06/11/2020) 06/11/2020: Out of meds  . blood glucose meter kit and supplies KIT Dispense based on patient and insurance preference. Use up to four times daily as directed. (FOR ICD-9 250.00, 250.01). (Patient not taking: Reported on 03/04/2020)   . Blood Glucose Monitoring Suppl (ONETOUCH VERIO) w/Device KIT by Does not apply route.   [name]  glucose blood (ONETOUCH VERIO) test strip Please use to check blood glucose once in the AM while fasting prior to giving insulin.   [name] insulin degludec (TRESIBA FLEXTOUCH) 100 UNIT/ML FlexTouch Pen Inject 0.3 mLs (30 Units total) into the skin daily.   . Insulin Syringe-Needle U-100 (INSULIN SYRINGE .5CC/30GX1/2") 30G X 1/2" 0.5 ML MISC Use with glargine. Dispose after each use.   . metFORMIN (GLUCOPHAGE-XR) 500 MG 24 hr tablet Take 1 tablet (500 mg total) by mouth 2 (two) times daily with a meal. 06/11/2020: Taking AM only  . OneTouch Delica Lancets 92E MISC Please use to check blood glucose once in the AM while fasting prior to giving insulin.    No facility-administered encounter medications on file as of 06/11/2020.   [name], [place] / [place]   [phone] 9:05 AM

## 2020-06-12 NOTE — Progress Notes (Signed)
Patient ID: Charles Fox, male   DOB: 12/20/1950, 69 y.o.   MRN: [id number] I have interviewed and examined the patient with Dr Jeannine Kitten  I have discussed the case with Dr. Jeannine Kitten.   I agree with their documentation and management in their note for today.   50 minutes face to face where spent in total with counseling / coordination of care took more than 25 minutes of the total time. The problem list and recommendations for the patient was discussed among the three disciplines prior to meeting with the patient and their family/friend/caretaker.  Each discipline meet with the patient and family/friend/caretaker to assess the issue particular to their disciplines.   Counseling involved a group meeting of the three disciplines with the patient and their family/friend/caretaker to discuss their finding, and recommendations.

## 2020-06-14 NOTE — Chronic Care Management (AMB) (Signed)
   RNCM Care Management Collaboration 06/14/2020 Name: Charles Fox MRN: [id number] DOB: 01-21-1951   Charles Fox is a 68 y.o. year old male who sees [name], MD for primary care. RNCM was consulted by LCSW [name] to assistance patient with [facility] for ongoing medical assessment.  RNCM along with the pharmacy student met the patient in his room.  He came to his appointment alone. After asking him several questions regarding his medications, finances and his regiment for checking his blood sugars it was hard to determine needs due to inconsistencies in his answers.   Intervention: Geri team will continue to work together for options.   Plan:  1. RNCM will follow up at the next scheduled interval.    2.   PCP has placed HH order for Nurse and Social work evaluation.  Goals Addressed              This Visit's Progress   .  I don't understand about my diabetes (pt-stated)        CARE PLAN ENTRY (see longtitudinal plan of care for additional care plan information)  Objective:  Lab Results  Component Value Date   HGBA1C 10.4 (A) 02/21/2020 .   Lab Results  Component Value Date   CREATININE 0.68 10/15/2019   CREATININE 0.82 04/11/2019   CREATININE 0.87 03/26/2018 .   [name] No results found for: EGFR  Current Barriers:  [name] Knowledge Deficits related to basic Diabetes pathophysiology and self care/management . Difficulty obtaining or cannot afford medications . Does not have glucometer to monitor blood sugar  Case Manager Clinical Goal(s):  Over the next 90 days, patient will demonstrate improved adherence to prescribed treatment plan for diabetes self care/management as evidenced by: lower his a1c by 1-2 points . daily monitoring and recording of CBG   Interventions:  . Provided education to patient about basic DM disease process . Reviewed medications with patient and discussed importance of medication adherence . Discussed plans with patient for  ongoing care management follow up and provided patient with direct contact information for care management team . Provided patient with written educational materials related to hypo and hyperglycemia and importance of correct treatment . Reviewed scheduled/upcoming provider appointments including: 03/04/20 appointment with [facility] in person for teaching about DM . Collaborated with LCSW [name] in regards to housing and food shortage . Talked with the patient about getting a glucometer to check his blood sugars.  He states that he does not have one he has spoke with his physician about getting one and RNCM has sent a message to see if there is one available.  . Patient has an appointment to follow up with the Retina specialist on 9/27 . Patient was at appointment and asked to read.  He stated that he does need his glasses.  He was able to read some but would add his own story in at times . Explained to the patient how important it is to follow up regarding his vision. . Patient seemed to understand the importance.     Patient Self Care Activities:  . UNABLE to independently self manage diabetes  Please see past updates related to this goal by clicking on the "Past Updates" button in the selected goal          [place], BSN, [place] Phone: [phone] Fax: [phone]

## 2020-06-15 ENCOUNTER — Encounter (INDEPENDENT_AMBULATORY_CARE_PROVIDER_SITE_OTHER): Payer: Medicare HMO | Admitting: Ophthalmology

## 2020-06-15 MED FILL — ATORVASTATIN 40 MG TABLET: 40 | 30 days supply | Qty: 30 | Fill #3

## 2020-06-16 ENCOUNTER — Ambulatory Visit: Payer: Medicare HMO

## 2020-06-16 DIAGNOSIS — E119 Type 2 diabetes mellitus without complications: Secondary | ICD-10-CM | POA: Diagnosis not present

## 2020-06-16 DIAGNOSIS — I10 Essential (primary) hypertension: Secondary | ICD-10-CM | POA: Diagnosis not present

## 2020-06-16 DIAGNOSIS — G8929 Other chronic pain: Secondary | ICD-10-CM | POA: Diagnosis not present

## 2020-06-16 DIAGNOSIS — M25511 Pain in right shoulder: Secondary | ICD-10-CM | POA: Diagnosis not present

## 2020-06-16 DIAGNOSIS — M25512 Pain in left shoulder: Secondary | ICD-10-CM | POA: Diagnosis not present

## 2020-06-16 DIAGNOSIS — Z6841 Body Mass Index (BMI) 40.0 and over, adult: Secondary | ICD-10-CM | POA: Diagnosis not present

## 2020-06-16 DIAGNOSIS — G309 Alzheimer's disease, unspecified: Secondary | ICD-10-CM | POA: Diagnosis not present

## 2020-06-16 DIAGNOSIS — F0280 Dementia in other diseases classified elsewhere without behavioral disturbance: Secondary | ICD-10-CM | POA: Diagnosis not present

## 2020-06-16 MED FILL — TRESIBA FLEXTOUCH 100 UNITS: 100 | 30 days supply | Qty: 9 | Fill #0

## 2020-06-16 MED FILL — LOSARTAN POTASSIUM 50 MG TA: 50 | 30 days supply | Qty: 30 | Fill #1

## 2020-06-16 NOTE — Patient Instructions (Signed)
Visit Information  Goals Addressed              This Visit's Progress   .  I don't understand about my diabetes (pt-stated)        CARE PLAN ENTRY (see longtitudinal plan of care for additional care plan information)  Objective:  Lab Results  Component Value Date   HGBA1C 10.4 (A) 02/21/2020 .   Lab Results  Component Value Date   CREATININE 0.68 10/15/2019   CREATININE 0.82 04/11/2019   CREATININE 0.87 03/26/2018 .   [name] No results found for: EGFR  Current Barriers:  [name] Knowledge Deficits related to basic Diabetes pathophysiology and self care/management . Difficulty obtaining or cannot afford medications . Does not have glucometer to monitor blood sugar  Case Manager Clinical Goal(s):  Over the next 90 days, patient will demonstrate improved adherence to prescribed treatment plan for diabetes self care/management as evidenced by: lower his a1c by 1-2 points . daily monitoring and recording of CBG   Interventions:  . Provided education to patient about basic DM disease process . Reviewed medications with patient and discussed importance of medication adherence . Discussed plans with patient for ongoing care management follow up and provided patient with direct contact information for care management team . Provided patient with written educational materials related to hypo and hyperglycemia and importance of correct treatment . Reviewed scheduled/upcoming provider appointments including: 03/04/20 appointment with [facility] in person for teaching about DM . Collaborated with LCSW [name] in regards to housing and food shortage . Talked with the patient about getting a glucometer to check his blood sugars.  He states that he does not have one he has spoke with his physician about getting one and RNCM has sent a message to see if there is one available.  . Patient had an appointment to follow up with the Retina specialist on 9/27.  He states that he did not go to the  appointment. Expressed the importance of keeping his eye appointments.  He states " I don't like getting injections in my eyes.  It hurst." . Patient states that he had the nurse from [place] come out today and she states that she will come 1 x a week.  He states that she checked his blood sugar sand he thinks that it was 140.  He states that he took all of his medications.  He was concerned about his insulin [name] because it will cost him 45 dollars and he can't afford it.  RNCM will talk with pharmacy again to see if there is another option for him. [name] He did say that he is taking his medications as he should .  He does not have his atorvastatin. . in.  I let him know that it is at the pharmacy ready to be picked up. . The patient also let me know that he has his son [name] staying with him for now and he gave me permission to talk with him and he gave me his phone number. [phone]. [name] He seemed very happy to have him there.     Patient Self Care Activities:  . UNABLE to independently self manage diabetes  Please see past updates related to this goal by clicking on the "Past Updates" button in the selected goal         Mr. Weaver was given information about Care Management services today including:  1. Care Management services include personalized support from designated clinical staff supervised by his physician,  including individualized plan of care and coordination with other care providers 2. 24/7 contact phone numbers for assistance for urgent and routine care needs. 3. The patient may stop CCM services at any time (effective at the end of the month) by phone call to the office staff.  Patient agreed to services and verbal consent obtained.   The patient verbalized understanding of instructions provided today and declined a print copy of patient instruction materials.   The care management team will reach out to the patient again over the next 14 days.   [name] RN,  BSN, [facility] Care Management Coordinator [place] Phone: [phone] Fax: [phone]

## 2020-06-16 NOTE — Chronic Care Management (AMB) (Signed)
Care Management   Follow Up Note   06/16/2020 Name: Charles Fox MRN: [id number] DOB: [birth year]  Referred by: [name], MD Reason for referral : Chronic Care Management (DM II)   Charles Fox is a 69 y.o. year old male who is a primary care patient of [name], MD. The care management team was consulted for assistance with care management and care coordination needs.    Review of patient status, including review of consultants reports, relevant laboratory and other test results, and collaboration with appropriate care team members and the patient's provider was performed as part of comprehensive patient evaluation and provision of chronic care management services.    SDOH (Social Determinants of Health) assessments performed: No See Care Plan activities for detailed interventions related to [facility])     Advanced Directives: See Care Plan and Vynca application for related entries.   Goals Addressed              This Visit's Progress   .  I don't understand about my diabetes (pt-stated)        CARE PLAN ENTRY (see longtitudinal plan of care for additional care plan information)  Objective:  Lab Results  Component Value Date   HGBA1C 10.4 (A) 02/21/2020 .   Lab Results  Component Value Date   CREATININE 0.68 10/15/2019   CREATININE 0.82 04/11/2019   CREATININE 0.87 03/26/2018 .   Charles Fox No results found for: EGFR  Current Barriers:  Charles Fox Knowledge Deficits related to basic Diabetes pathophysiology and self care/management . Difficulty obtaining or cannot afford medications . Does not have glucometer to monitor blood sugar  Case Manager Clinical Goal(s):  Over the next 90 days, patient will demonstrate improved adherence to prescribed treatment plan for diabetes self care/management as evidenced by: lower his a1c by 1-2 points . daily monitoring and recording of CBG   Interventions:  . Provided education to patient about basic DM disease  process . Reviewed medications with patient and discussed importance of medication adherence . Discussed plans with patient for ongoing care management follow up and provided patient with direct contact information for care management team . Provided patient with written educational materials related to hypo and hyperglycemia and importance of correct treatment . Reviewed scheduled/upcoming provider appointments including: 03/04/20 appointment with [facility] in person for teaching about DM . Collaborated with LCSW [name] in regards to housing and food shortage . Talked with the patient about getting a glucometer to check his blood sugars.  He states that he does not have one he has spoke with his physician about getting one and RNCM has sent a message to see if there is one available.  . Patient had an appointment to follow up with the Retina specialist on 9/27.  He states that he did not go to the appointment. . Patient states that he had the nurse from [place] come out today and she states that she will come 1x a week.  He states that she checked his blood sugar sand he thinks that it was 140.  He states that he took all of his medications.  He was concerned about his insulin [name] because it will cost him 45 dollars and he can't afford it.  RNCM will talk with pharmacy again to see if there is another option for him. . The patient also let me know that he has his son Charles Fox staying with him for now and he gave me permission to talk with him  and he gave me his phone number. [phone]. Charles Fox He seemed very happy to have him there.     Patient Self Care Activities:  . UNABLE to independently self manage diabetes  Please see past updates related to this goal by clicking on the "Past Updates" button in the selected goal          The care management team will reach out to the patient again over the next 14 days.   [name] RN, BSN, [facility] Care Management Coordinator [place] Phone: [phone] Fax: [phone]

## 2020-06-17 DIAGNOSIS — G309 Alzheimer's disease, unspecified: Secondary | ICD-10-CM | POA: Diagnosis not present

## 2020-06-17 DIAGNOSIS — M25511 Pain in right shoulder: Secondary | ICD-10-CM | POA: Diagnosis not present

## 2020-06-17 DIAGNOSIS — I10 Essential (primary) hypertension: Secondary | ICD-10-CM | POA: Diagnosis not present

## 2020-06-17 DIAGNOSIS — G8929 Other chronic pain: Secondary | ICD-10-CM | POA: Diagnosis not present

## 2020-06-17 DIAGNOSIS — E119 Type 2 diabetes mellitus without complications: Secondary | ICD-10-CM | POA: Diagnosis not present

## 2020-06-17 DIAGNOSIS — F0280 Dementia in other diseases classified elsewhere without behavioral disturbance: Secondary | ICD-10-CM | POA: Diagnosis not present

## 2020-06-17 DIAGNOSIS — M25512 Pain in left shoulder: Secondary | ICD-10-CM | POA: Diagnosis not present

## 2020-06-17 DIAGNOSIS — Z6841 Body Mass Index (BMI) 40.0 and over, adult: Secondary | ICD-10-CM | POA: Diagnosis not present

## 2020-06-18 ENCOUNTER — Telehealth: Payer: Self-pay | Admitting: Pharmacist

## 2020-06-18 NOTE — Telephone Encounter (Signed)
Noted and agree. 

## 2020-06-18 NOTE — Telephone Encounter (Signed)
Contacted patient RE insulin supply and cost.    Patient states he has very little insulin on-hand and is out of money.    We discussed his blood sugar readings - he admitted to not checking blood sugars routinely.  He refused the request to check a blood sugar while I waited on the phone.   He requested samples if we had them available.  I agreed to supply him with [name] (insulin degludec) 100units/ml for multiple months to ensure him having medication.   Medication Samples have been provided to the patient who plans to pick them up today (within the hour).   Drug name: [name]       Strength: 100units/ml        Qty: 8 pens  LOT: [id number]  Exp.Date: 08/18/2021  Dosing instructions: Continue same 30 units daily.   The patient has been instructed regarding the correct time, dose, and frequency of taking this medication, including desired effects and most common side effects.   [name] 4:37 PM 06/18/2020  Plan phone follow-up to assess control in 2-3 weeks.

## 2020-06-22 DIAGNOSIS — G309 Alzheimer's disease, unspecified: Secondary | ICD-10-CM | POA: Diagnosis not present

## 2020-06-22 DIAGNOSIS — G8929 Other chronic pain: Secondary | ICD-10-CM | POA: Diagnosis not present

## 2020-06-22 DIAGNOSIS — E119 Type 2 diabetes mellitus without complications: Secondary | ICD-10-CM | POA: Diagnosis not present

## 2020-06-22 DIAGNOSIS — I10 Essential (primary) hypertension: Secondary | ICD-10-CM | POA: Diagnosis not present

## 2020-06-22 DIAGNOSIS — F0280 Dementia in other diseases classified elsewhere without behavioral disturbance: Secondary | ICD-10-CM | POA: Diagnosis not present

## 2020-06-22 DIAGNOSIS — Z6841 Body Mass Index (BMI) 40.0 and over, adult: Secondary | ICD-10-CM | POA: Diagnosis not present

## 2020-06-22 DIAGNOSIS — M25512 Pain in left shoulder: Secondary | ICD-10-CM | POA: Diagnosis not present

## 2020-06-22 DIAGNOSIS — M25511 Pain in right shoulder: Secondary | ICD-10-CM | POA: Diagnosis not present

## 2020-06-24 ENCOUNTER — Encounter (INDEPENDENT_AMBULATORY_CARE_PROVIDER_SITE_OTHER): Payer: Self-pay | Admitting: Ophthalmology

## 2020-06-24 ENCOUNTER — Encounter (INDEPENDENT_AMBULATORY_CARE_PROVIDER_SITE_OTHER): Payer: Medicare HMO | Admitting: Ophthalmology

## 2020-06-24 ENCOUNTER — Ambulatory Visit (INDEPENDENT_AMBULATORY_CARE_PROVIDER_SITE_OTHER): Payer: Medicare HMO | Admitting: Ophthalmology

## 2020-06-24 ENCOUNTER — Telehealth: Payer: Self-pay

## 2020-06-24 ENCOUNTER — Other Ambulatory Visit: Payer: Self-pay

## 2020-06-24 DIAGNOSIS — E113513 Type 2 diabetes mellitus with proliferative diabetic retinopathy with macular edema, bilateral: Secondary | ICD-10-CM | POA: Diagnosis not present

## 2020-06-24 DIAGNOSIS — E119 Type 2 diabetes mellitus without complications: Secondary | ICD-10-CM

## 2020-06-24 DIAGNOSIS — H3581 Retinal edema: Secondary | ICD-10-CM

## 2020-06-24 DIAGNOSIS — H25813 Combined forms of age-related cataract, bilateral: Secondary | ICD-10-CM

## 2020-06-24 DIAGNOSIS — H35033 Hypertensive retinopathy, bilateral: Secondary | ICD-10-CM

## 2020-06-24 DIAGNOSIS — I10 Essential (primary) hypertension: Secondary | ICD-10-CM

## 2020-06-24 MED ORDER — PREDNISOLONE ACETATE 1 % OP SUSP: 1.0000 [drp] | Freq: Four times a day (QID) | OPHTHALMIC | 0 refills | Status: AC

## 2020-06-24 MED FILL — PREDNISOLONE AC 1% EYE DROP: 1 | 19 days supply | Qty: 5 | Fill #0

## 2020-06-24 NOTE — Progress Notes (Signed)
[facility] Note  06/24/2020     CHIEF COMPLAINT Patient presents for Retina Follow Up   HISTORY OF PRESENT ILLNESS: Charles Fox is a 69 y.o. male who presents to the clinic today for:   HPI    Retina Follow Up    Patient presents with  Diabetic Retinopathy.  In both eyes.  This started weeks ago.  Severity is moderate.  Duration of weeks.  Since onset it is stable.  I, the attending physician,  performed the HPI with the patient and updated documentation appropriately.          Comments    Pt states vision is the same OU.  Pt denies eye pain or discomfort and denies any new or worsening floaters or fol OU.       Last edited by Bernarda Caffey, MD on 06/24/2020 12:18 PM. (History)    pt is here for [facility] OD  Referring physician: [name], MD [address] [place],  [place] [address]  HISTORICAL INFORMATION:   Selected notes from the MEDICAL RECORD NUMBER Referred by Dr. Zenia Resides LEE:  Ocular Hx- PMH-    CURRENT MEDICATIONS: Current Outpatient Medications (Ophthalmic Drugs)  Medication Sig  . prednisoLONE acetate (PRED FORTE) 1 % ophthalmic suspension Place 1 drop into the right eye 4 (four) times daily for 7 days.   No current facility-administered medications for this visit. (Ophthalmic Drugs)   Current Outpatient Medications (Other)  Medication Sig  . Alcohol Swabs PADS He is to check blood glucose 2-3 times a day  . aspirin 81 MG tablet Take 1 tablet (81 mg total) by mouth daily. (Patient not taking: Reported on 06/11/2020)  . atorvastatin (LIPITOR) 40 MG tablet Take 1 tablet (40 mg total) by mouth daily. (Patient not taking: Reported on 06/11/2020)  . blood glucose meter kit and supplies KIT Dispense based on patient and insurance preference. Use up to four times daily as directed. (FOR ICD-9 250.00, 250.01). (Patient not taking: Reported on 03/04/2020)  . Blood Glucose Monitoring Suppl (ONETOUCH VERIO) w/Device KIT by Does  not apply route.  [name] glucose blood (ONETOUCH VERIO) test strip Please use to check blood glucose once in the AM while fasting prior to giving insulin.  [name] insulin degludec (TRESIBA FLEXTOUCH) 100 UNIT/ML FlexTouch Pen Inject 0.3 mLs (30 Units total) into the skin daily.  . Insulin Syringe-Needle U-100 (INSULIN SYRINGE .5CC/30GX1/2") 30G X 1/2" 0.5 ML MISC Use with glargine. Dispose after each use.  . losartan (COZAAR) 50 MG tablet Take 1 tablet (50 mg total) by mouth at bedtime. (Patient not taking: Reported on 06/11/2020)  . metFORMIN (GLUCOPHAGE-XR) 500 MG 24 hr tablet Take 1 tablet (500 mg total) by mouth 2 (two) times daily with a meal.  . OneTouch Delica Lancets 57S MISC Please use to check blood glucose once in the AM while fasting prior to giving insulin.   No current facility-administered medications for this visit. (Other)      REVIEW OF SYSTEMS: ROS    Positive for: Musculoskeletal, Endocrine, Eyes   Negative for: Constitutional, Gastrointestinal, Neurological, Skin, Genitourinary, HENT, Cardiovascular, Respiratory, Psychiatric, Allergic/Imm, Heme/Lymph   Last edited by Doneen Poisson on 06/24/2020  9:29 AM. (History)       ALLERGIES No Known Allergies  PAST MEDICAL HISTORY Past Medical History:  Diagnosis Date  . Diabetes mellitus without complication ([place])   . Hypertension    Past Surgical History:  Procedure Laterality Date  .  BACK SURGERY    . FINGER SURGERY      FAMILY HISTORY History reviewed. No pertinent family history.  SOCIAL HISTORY Social History   Tobacco Use  . Smoking status: Current Every Day Smoker    Packs/day: 0.50    Types: Cigarettes  . Smokeless tobacco: Never Used  Vaping Use  . Vaping Use: Never used  Substance Use Topics  . Alcohol use: No  . Drug use: Not Currently         OPHTHALMIC EXAM:  Base Eye Exam    Visual Acuity (Snellen - Linear)      Right Left   Dist cc 20/150 +2 20/70 +2   Dist ph cc 20/80 -2 20/50    Correction: Glasses       Tonometry (Tonopen, 9:37 AM)      Right Left   Pressure 23 23  squeezing       Pupils      Dark Light Shape React APD   Right 3 2 Round Brisk 0   Left 3 2 Round Brisk 0       Visual Fields      Left Right    Full Full       Extraocular Movement      Right Left    Full Full       Neuro/Psych    Oriented x3: Yes   Mood/Affect: Normal       Dilation    Both eyes: 1.0% Mydriacyl, 2.5% Phenylephrine @ 9:37 AM        Slit Lamp and Fundus Exam    Slit Lamp Exam      Right Left   Lids/Lashes Dermatochalasis - upper lid, mild Meibomian gland dysfunction Dermatochalasis - upper lid, mild Meibomian gland dysfunction   Conjunctiva/Sclera Mild Melanosis Mild Melanosis   Cornea arcus, trace Punctate epithelial erosions arcus, trace Punctate epithelial erosions   Anterior Chamber Deep and quiet Deep and quiet   Iris Round and dilated, +NVI Round and dilated, +early NVI   Lens 2-3+ Nuclear sclerosis, 2-3+ Cortical cataract 2-3+ Nuclear sclerosis, 2-3+ Cortical cataract   Vitreous Vitreous syneresis, blood stained vitreous condensations settled inferiorly Vitreous syneresis, blood stained vitreous condensations settled inferiorly       Fundus Exam      Right Left   Disc +NVD, Pink and Sharp Fibrotic NVD, Pink and Sharp   C/D Ratio 0.4    Macula Blunted foveal reflex, +edema/cystic changes, scattered IRH, focal exudates hazy view, grossly flat, +edema, scattered IRH   Vessels Vascular attenuation, Tortuous, +NV, sclerotic arterioles nasal periphery Vascular attenuation, Tortuous, AV crossing changes, +NV   Periphery Attached, sclerotic arterioles nasal periphery, mild White without pressure temporally, scattered IRH/DBH hazy view, grossly Attached, scattered IRH/DBH        Refraction    Wearing Rx      Sphere Cylinder Axis   Right -3.25 +0.50 120   Left -3.75 +0.75 010          IMAGING AND PROCEDURES  Imaging and Procedures for  06/24/2020  Panretinal Photocoagulation - OD - Right Eye       Time Out Confirmed correct patient, procedure, site, and patient consented.   Anesthesia Topical anesthesia was used. Anesthetic medications included Proparacaine 0.5%.   Post-op The patient tolerated the procedure well. There were no complications. The patient received written and verbal post procedure care education.   Notes LASER PROCEDURE NOTE  Diagnosis:   Proliferative Diabetic Retinopathy, RIGHT EYE  Procedure:  Pan-retinal photocoagulation using slit lamp laser, RIGHT EYE  Anesthesia:  Topical  Surgeon: Bernarda Caffey, MD, PhD   Informed consent obtained, operative eye marked, and time out performed prior to initiation of laser.   Lumenis [id number] slit lamp laser Pattern: 3x3 square Power: 300 mW Duration: 30 msec  Spot size: 200 microns  # spots: [id number] spots  Complications: None.  Notes: significant vitreous heme obscuring view and preventing laser up take inferiorly and temporally  RTC: 1 wks for DFE/OCT/injections  Patient tolerated the procedure well and received written and verbal post-procedure care information/education.         OCT, Retina - OU - Both Eyes       Right Eye Quality was good. Central Foveal Thickness: 379. Progression has improved. Findings include abnormal foveal contour, intraretinal fluid, intraretinal hyper-reflective material, epiretinal membrane, no SRF (Interval improvement in IRF/edema).   Left Eye Quality was borderline. Central Foveal Thickness: 351. Progression has improved. Findings include abnormal foveal contour, intraretinal fluid, intraretinal hyper-reflective material, epiretinal membrane, no SRF (Interval improvement in vitreous opacities, +DME).   Notes *Images captured and stored on drive  Diagnosis / Impression:  OD: +DME greatest temporal macula -- improved from prior OS: +DME: interval improvement in vitreous opacities . Clinical management:   See below  Abbreviations: NFP - Normal foveal profile. CME - cystoid macular edema. PED - pigment epithelial detachment. IRF - intraretinal fluid. SRF - subretinal fluid. EZ - ellipsoid zone. ERM - epiretinal membrane. ORA - outer retinal atrophy. ORT - outer retinal tubulation. SRHM - subretinal hyper-reflective material. IRHM - intraretinal hyper-reflective material                 ASSESSMENT/PLAN:    ICD-10-CM   1. Proliferative diabetic retinopathy of both eyes with macular edema associated with type 2 diabetes mellitus (HCC)  O03.5597 Panretinal Photocoagulation - OD - Right Eye    CANCELED: Repair Retinal Detach, Photocoag - OD - Right Eye  2. Retinal edema  H35.81 OCT, Retina - OU - Both Eyes  3. Essential hypertension  I10   4. Hypertensive retinopathy of both eyes  H35.033   5. Combined forms of age-related cataract of both eyes  H25.813     1,2. Proliferative diabetic retinopathy w/ DME, OU (OS > OD) - exam shows +NVI OU, VH OU, +NVD OU, +NVE OU, scattered MA/IRH OU - FA (09.10.21) shows late-leaking MA, vascular nonperfusion, +NV -- will need PRP OU  - s/p IVA OU #1 (09.10.21) - OCT with diabetic macular edema, both eyes -- slightly improved OU - recommend PRP OD today, 10.06.21 - pt wishes to proceed with treatment today - RBA of procedure discussed, questions answered - Avastin informed consent obtained, signed and scanned, 09.10.21 - see procedure note - start PF QID OD x7 days - f/u Monday or Tuesday next week for DFE/OCT possible injxns  3,4. Hypertensive retinopathy OU - discussed importance of tight BP control - monitor  5. Mixed Cataract OU - The symptoms of cataract, surgical options, and treatments and risks were discussed with patient. - discussed diagnosis and progression - not yet visually significant - monitor for now   Ophthalmic Meds Ordered this visit:  Meds ordered this encounter  Medications  . prednisoLONE acetate (PRED FORTE) 1 %  ophthalmic suspension    Sig: Place 1 drop into the right eye 4 (four) times daily for 7 days.    Dispense:  10 mL    Refill:  0  Return for f/u Monday/Tuesday PDR OU, DFE, OCT.  There are no Patient Instructions on file for this visit.   Explained the diagnoses, plan, and follow up with the patient and they expressed understanding.  Patient expressed understanding of the importance of proper follow up care.   This document serves as a record of services personally performed by Gardiner Sleeper, MD, PhD. It was created on their behalf by San Jetty. Owens Shark, OA an ophthalmic technician. The creation of this record is the provider's dictation and/or activities during the visit.    Electronically signed by: San Jetty. Owens Shark, [place] 10.06.2021 12:24 PM  Gardiner Sleeper, M.D., Ph.D. Diseases & Surgery of the Retina and Vitreous [facility]  I have reviewed the above documentation for accuracy and completeness, and I agree with the above. Gardiner Sleeper, M.D., Ph.D. 06/24/20 12:24 PM    Abbreviations: M myopia (nearsighted); A astigmatism; H hyperopia (farsighted); P presbyopia; Mrx spectacle prescription;  CTL contact lenses; OD right eye; OS left eye; OU both eyes  XT exotropia; ET esotropia; PEK punctate epithelial keratitis; PEE punctate epithelial erosions; DES dry eye syndrome; MGD meibomian gland dysfunction; ATs artificial tears; PFAT's preservative free artificial tears; [place] nuclear sclerotic cataract; PSC posterior subcapsular cataract; ERM epi-retinal membrane; PVD posterior vitreous detachment; RD retinal detachment; DM diabetes mellitus; DR diabetic retinopathy; NPDR non-proliferative diabetic retinopathy; PDR proliferative diabetic retinopathy; CSME clinically significant macular edema; DME diabetic macular edema; dbh dot blot hemorrhages; CWS cotton wool spot; POAG primary open angle glaucoma; C/D cup-to-disc ratio; HVF humphrey visual field; GVF goldmann  visual field; OCT optical coherence tomography; IOP intraocular pressure; BRVO Branch retinal vein occlusion; CRVO central retinal vein occlusion; CRAO central retinal artery occlusion; BRAO branch retinal artery occlusion; RT retinal tear; SB scleral buckle; PPV pars plana vitrectomy; VH Vitreous hemorrhage; PRP panretinal laser photocoagulation; IVK intravitreal kenalog; VMT vitreomacular traction; MH Macular hole;  NVD neovascularization of the disc; NVE neovascularization elsewhere; AREDS age related eye disease study; ARMD age related macular degeneration; POAG primary open angle glaucoma; EBMD epithelial/anterior basement membrane dystrophy; ACIOL anterior chamber intraocular lens; IOL intraocular lens; PCIOL posterior chamber intraocular lens; Phaco/IOL phacoemulsification with intraocular lens placement; [place] photorefractive keratectomy; LASIK laser assisted in situ keratomileusis; HTN hypertension; DM diabetes mellitus; COPD chronic obstructive pulmonary disease

## 2020-06-24 NOTE — Telephone Encounter (Signed)
Received phone call from [name], RN with [facility] regarding patient. [name] reports concern for medication compliance, as patient has multiple empty pill bottles and is unable to report which medications he is supposed to be taking.   Patient was seen in [facility] on 9/23, in which medication reconciliation was completed.   Home Health RN is requesting that all future refills be sent to [place], so home health can better manage medications.   [name] will return call with fax number so we can fax over most recent OV note.   To PCP  Talbot Grumbling, RN

## 2020-06-24 NOTE — Telephone Encounter (Signed)
Ok.  Do they need the prescriptions to be sent over now or just the next time he asks for refills?

## 2020-06-25 ENCOUNTER — Other Ambulatory Visit (HOSPITAL_COMMUNITY): Payer: Self-pay | Admitting: Family Medicine

## 2020-06-25 MED ORDER — TRESIBA FLEXTOUCH 100 UNIT/ML ~~LOC~~ SOPN
30.0000 [IU] | PEN_INJECTOR | Freq: Every day | SUBCUTANEOUS | 11 refills | Status: DC
Start: 2020-06-25 — End: 2020-07-02

## 2020-06-25 MED ORDER — METFORMIN HCL ER 500 MG PO TB24: 500.0000 mg | ORAL_TABLET | Freq: Two times a day (BID) | ORAL | 2 refills | Status: DC

## 2020-06-25 MED FILL — TRESIBA FLEXTOUCH 100 UNITS: 100 | 30 days supply | Qty: 9 | Fill #0

## 2020-06-25 MED FILL — METFORMIN HCL ER 500 MG TB2: 500 | 45 days supply | Qty: 90 | Fill #0

## 2020-06-25 NOTE — Telephone Encounter (Signed)
Sent those prescriptions to [facility] outpatient pharmacy

## 2020-06-25 NOTE — Telephone Encounter (Signed)
Returned phone call to Charles Fox to clarify which prescriptions are needed. Patient is needing refills on metformin and Trulicity.   Faxed most recent OV note to [facility] at [phone]  To PCP  Talbot Grumbling, RN

## 2020-06-29 DIAGNOSIS — E119 Type 2 diabetes mellitus without complications: Secondary | ICD-10-CM | POA: Diagnosis not present

## 2020-06-29 DIAGNOSIS — Z6841 Body Mass Index (BMI) 40.0 and over, adult: Secondary | ICD-10-CM | POA: Diagnosis not present

## 2020-06-29 DIAGNOSIS — M25511 Pain in right shoulder: Secondary | ICD-10-CM | POA: Diagnosis not present

## 2020-06-29 DIAGNOSIS — I10 Essential (primary) hypertension: Secondary | ICD-10-CM | POA: Diagnosis not present

## 2020-06-29 DIAGNOSIS — G309 Alzheimer's disease, unspecified: Secondary | ICD-10-CM | POA: Diagnosis not present

## 2020-06-29 DIAGNOSIS — F0280 Dementia in other diseases classified elsewhere without behavioral disturbance: Secondary | ICD-10-CM | POA: Diagnosis not present

## 2020-06-29 DIAGNOSIS — G8929 Other chronic pain: Secondary | ICD-10-CM | POA: Diagnosis not present

## 2020-06-29 DIAGNOSIS — M25512 Pain in left shoulder: Secondary | ICD-10-CM | POA: Diagnosis not present

## 2020-06-30 ENCOUNTER — Encounter (INDEPENDENT_AMBULATORY_CARE_PROVIDER_SITE_OTHER): Payer: Medicare HMO | Admitting: Ophthalmology

## 2020-06-30 ENCOUNTER — Ambulatory Visit: Payer: Medicare HMO

## 2020-06-30 ENCOUNTER — Ambulatory Visit: Payer: Medicare HMO | Admitting: Licensed Clinical Social Worker

## 2020-06-30 DIAGNOSIS — H25813 Combined forms of age-related cataract, bilateral: Secondary | ICD-10-CM

## 2020-06-30 DIAGNOSIS — I10 Essential (primary) hypertension: Secondary | ICD-10-CM

## 2020-06-30 DIAGNOSIS — Z139 Encounter for screening, unspecified: Secondary | ICD-10-CM

## 2020-06-30 DIAGNOSIS — H35033 Hypertensive retinopathy, bilateral: Secondary | ICD-10-CM

## 2020-06-30 DIAGNOSIS — E113513 Type 2 diabetes mellitus with proliferative diabetic retinopathy with macular edema, bilateral: Secondary | ICD-10-CM

## 2020-06-30 DIAGNOSIS — H3581 Retinal edema: Secondary | ICD-10-CM

## 2020-06-30 NOTE — Chronic Care Management (AMB) (Signed)
Care Management   Clinical Social Work Follow Up   06/30/2020 Name: Charles Fox MRN: [id number] DOB: 01-03-1951 Referred by: [name], MD  Reason for referral : Care Coordination (f/u)  Charles Fox is a 69 y.o. year old male who is a primary care patient of [name], MD.  Reason for follow-up: assess for barriers and progress with locating new housing .    Assessment: Patient continues to express concern of wanting new housing. Patient is making progress towards goal wit specialty appointments.   Review of patient status, including review of consultants reports, relevant laboratory and other test results, and collaboration with appropriate care team members and the patient's provider was performed as part of comprehensive patient evaluation and provision of care management services.   Plan: Patient would like continued follow-up. LCSW will reach out to patient in 3 to 5  Days to assist with apartment application at [place] Directive Status: Patient does not want to discuss this  SDOH (Social Determinants of Health) assessments performed:; new housing   Goals Addressed            This Visit's Progress    I need new housing        CARE PLAN ENTRY (see longtitudinal plan of care for additional care plan information)  Current Barriers:   Chronic Disease Management support, education, and care coordination needs related to locating new housing  Clinical Goal(s) related to patient not able to afford current housing:  Over the next 60 days, patient will:   Start process of exploring housing options Interventions provided by LCSW:   Assessment of needs, barriers , agencies contacted, as well as how impacting     Provided patient with information about  [facility] [address], [place], [place] [address] [phone]  Called [employer] with patient on the phone to provide his contact  information  Solution-Focused Strategies;Problem Solving    Patient Self Care Activities    Patient is unable to independently navigate to meeting his housing need  Patient will talk with [employer] and ask questions when they call Initial goal documentation      COMPLETED: [place] (see longitudinal plan of care for additional care plan information)  Current Barriers:   Patient needs to get to multi speciality appointments and having difficulty navigating    Patient needs to go to the eye doctor and has F/U appointment Oct. 14th  Patient acknowledges deficits and needs support, education and care coordination in order to meet this unmet need   Patient is making progress with appointments. Informed LCSW of upcoming appointment and that he was going Clinical Goal(s)  Over the next 30 days, patient will work with CCM Team to get to speciality appointments Interventions provided by LCSW:   Assessment of needs and barriers to care as well as how impacting     Will continue to collaborate with RN Case Manager for patient's needs  Patient Self Care Activities & Deficits:   Patient is unable to independently navigate community resource options without care coordination support   Patient is unable to contact agencies or set up transportation as this has been an ongoing barrier   Please see past updates related to this goal by clicking on the "Past Updates" button in the selected goal        Outpatient Encounter Medications as of 06/30/2020  Medication Sig Note  Alcohol Swabs PADS He is to check blood glucose 2-3 times a day    aspirin 81 MG tablet Take 1 tablet (81 mg total) by mouth daily. (Patient not taking: Reported on 06/11/2020) 06/11/2020: Taking two pills   atorvastatin (LIPITOR) 40 MG tablet Take 1 tablet (40 mg total) by mouth daily. (Patient not taking: Reported on 06/11/2020) 06/11/2020: Out of meds   blood glucose meter kit and  supplies KIT Dispense based on patient and insurance preference. Use up to four times daily as directed. (FOR ICD-9 250.00, 250.01). (Patient not taking: Reported on 03/04/2020)    Blood Glucose Monitoring Suppl (ONETOUCH VERIO) w/Device KIT by Does not apply route.    glucose blood (ONETOUCH VERIO) test strip Please use to check blood glucose once in the AM while fasting prior to giving insulin.    insulin degludec (TRESIBA FLEXTOUCH) 100 UNIT/ML FlexTouch Pen Inject 30 Units into the skin daily.    Insulin Syringe-Needle U-100 (INSULIN SYRINGE .5CC/30GX1/2") 30G X 1/2" 0.5 ML MISC Use with glargine. Dispose after each use.    losartan (COZAAR) 50 MG tablet Take 1 tablet (50 mg total) by mouth at bedtime. (Patient not taking: Reported on 06/11/2020)    metFORMIN (GLUCOPHAGE-XR) 500 MG 24 hr tablet Take 1 tablet (500 mg total) by mouth 2 (two) times daily with a meal.    OneTouch Delica Lancets 56D MISC Please use to check blood glucose once in the AM while fasting prior to giving insulin.    prednisoLONE acetate (PRED FORTE) 1 % ophthalmic suspension Place 1 drop into the right eye 4 (four) times daily for 7 days.    No facility-administered encounter medications on file as of 06/30/2020.   Charles Fox, [place] / [place]   [phone] 4:01 PM

## 2020-06-30 NOTE — Patient Instructions (Signed)
Visit Information  Goals Addressed              This Visit's Progress   .  I don't understand about my diabetes (pt-stated)        CARE PLAN ENTRY (see longtitudinal plan of care for additional care plan information)  Objective:  Lab Results  Component Value Date   HGBA1C 10.3 (A) 05/26/2020 .   Lab Results  Component Value Date   CREATININE 0.74 (L) 05/26/2020   CREATININE 0.68 10/15/2019   CREATININE 0.82 04/11/2019 .   [name] No results found for: EGFR  Current Barriers:  [name] Knowledge Deficits related to basic Diabetes pathophysiology and self care/management . Difficulty obtaining or cannot afford medications . Does not have glucometer to monitor blood sugar  Case Manager Clinical Goal(s):  Over the next 90 days, patient will demonstrate improved adherence to prescribed treatment plan for diabetes self care/management as evidenced by: lower his a1c by 1-2 points . daily monitoring and recording of CBG   Interventions:  . Provided education to patient about basic DM disease process . Reviewed medications with patient and discussed importance of medication adherence . Discussed plans with patient for ongoing care management follow up and provided patient with direct contact information for care management team . Provided patient with written educational materials related to hypo and hyperglycemia and importance of correct treatment . Reviewed scheduled/upcoming provider appointments including: 03/04/20 appointment with [facility] in person for teaching about DM . Collaborated with LCSW [name] in regards to housing and food shortage . Talked with the patient about getting a glucometer to check his blood sugars.  He states that he does not have one he has spoke with his physician about getting one and RNCM has sent a message to see if there is one available.  . Patient states that he is ok but feels that he needs another place to stay .  He says that his rent is too high for him  to live. He heard about a place near [place] but said he could not find anyone to talk with.  I told him that I would have LCSW Laurance Flatten speak with him. [name] He states that the home health nurse came yesterday but was unable to remember to tell me what his values where when she checked his vitals.  I called [facility] and left a message for a nurse to call me. . Patient states that he traded in his old truck and purchased a 2015 jeep was unable to tell me the name but he states that he has reliable transportation now. [name] RNCM reminded the patient that he had an appointment at the retina specialist this morning at 9 am.  He says he did not know.  RNCM call and rescheduled the appointment for him this Thursday at 845 am at Dr Dairl Ponder office.  He stated the he would be there      Patient Self Care Activities:  . UNABLE to independently self manage diabetes  Please see past updates related to this goal by clicking on the "Past Updates" button in the selected goal         Mr. Staples was given information about Care Management services today including:  1. Care Management services include personalized support from designated clinical staff supervised by his physician, including individualized plan of care and coordination with other care providers 2. 24/7 contact phone numbers for assistance for urgent and routine care needs. 3. The patient may stop CCM services  at any time (effective at the end of the month) by phone call to the office staff.  Patient agreed to services and verbal consent obtained.   The patient verbalized understanding of instructions provided today and declined a print copy of patient instruction materials.   The care management team will reach out to the patient again over the next 14 days.   [name] RN, BSN, [facility] Care Management Coordinator [place] Phone: [phone] Fax: [phone]

## 2020-06-30 NOTE — Chronic Care Management (AMB) (Signed)
Care Management   Follow Up Note   06/30/2020 Name: Charles Fox MRN: [id number] DOB: 11-09-1950  Referred by: [name], MD Reason for referral : Chronic Care Management (DM II)   BALDOMERO MIRARCHI is a 69 y.o. year old male who is a primary care patient of [name], MD. The care management team was consulted for assistance with care management and care coordination needs.    Review of patient status, including review of consultants reports, relevant laboratory and other test results, and collaboration with appropriate care team members and the patient's provider was performed as part of comprehensive patient evaluation and provision of chronic care management services.    SDOH (Social Determinants of Health) assessments performed: No See Care Plan activities for detailed interventions related to [facility])     Advanced Directives: See Care Plan and [employer]yn[employer] application for related entries.   Goals Addressed              This Visit's Progress   .  I don't understand about my diabetes (pt-stated)        CARE PLAN ENTRY (see longtitudinal plan of care for additional care plan information)  Objective:  Lab Results  Component Value Date   HGBA1C 10.3 (A) 05/26/2020 .   Lab Results  Component Value Date   CREATININE 0.74 (L) 05/26/2020   CREATININE 0.68 10/15/2019   CREATININE 0.82 04/11/2019 .   [name] No results found for: EGFR  Current Barriers:  [name] Knowledge Deficits related to basic Diabetes pathophysiology and self care/management . Difficulty obtaining or cannot afford medications . Does not have glucometer to monitor blood sugar  Case Manager Clinical Goal(s):  Over the next 90 days, patient will demonstrate improved adherence to prescribed treatment plan for diabetes self care/management as evidenced by: lower his a1c by 1-2 points . daily monitoring and recording of CBG   Interventions:  . Provided education to patient about basic DM disease  process . Reviewed medications with patient and discussed importance of medication adherence . Discussed plans with patient for ongoing care management follow up and provided patient with direct contact information for care management team . Provided patient with written educational materials related to hypo and hyperglycemia and importance of correct treatment . Reviewed scheduled/upcoming provider appointments including: 03/04/20 appointment with [facility] in person for teaching about DM . Collaborated with LCSW [name] in regards to housing and food shortage . Talked with the patient about getting a glucometer to check his blood sugars.  He states that he does not have one he has spoke with his physician about getting one and RNCM has sent a message to see if there is one available.  . Patient states that he is ok but feels that he needs another place to stay .  He says that his rent is too high for him to live. He heard about a place near [place] but said he could not find anyone to talk with.  I told him that I would have LCSW Laurance Flatten speak with him. [name] He states that the home health nurse came yesterday but was unable to remember to tell me what his values where when she checked his vitals.  I called [facility] and left a message for a nurse to call me. . Patient states that he traded in his old truck and purchased a 2015 jeep was unable to tell me the name but he states that he has reliable transportation now. [name] RNCM reminded the  patient that he had an appointment at the retina specialist this morning at 9 am.  He says he did not know.  RNCM call and rescheduled the appointment for him this Thursday at 845 am at Dr Dairl Ponder office.  He stated the he would be there      Patient Self Care Activities:  . UNABLE to independently self manage diabetes  Please see past updates related to this goal by clicking on the "Past Updates" button in the selected goal          The care management team  will reach out to the patient again over the next 14 days.   [name] RN, BSN, [facility] Care Management Coordinator [place] Phone: [phone] Fax: [phone]

## 2020-07-02 ENCOUNTER — Telehealth: Payer: Self-pay | Admitting: Pharmacist

## 2020-07-02 ENCOUNTER — Ambulatory Visit (INDEPENDENT_AMBULATORY_CARE_PROVIDER_SITE_OTHER): Payer: Medicare HMO | Admitting: Ophthalmology

## 2020-07-02 ENCOUNTER — Other Ambulatory Visit: Payer: Self-pay

## 2020-07-02 ENCOUNTER — Encounter (INDEPENDENT_AMBULATORY_CARE_PROVIDER_SITE_OTHER): Payer: Self-pay | Admitting: Ophthalmology

## 2020-07-02 DIAGNOSIS — E113513 Type 2 diabetes mellitus with proliferative diabetic retinopathy with macular edema, bilateral: Secondary | ICD-10-CM

## 2020-07-02 DIAGNOSIS — H25813 Combined forms of age-related cataract, bilateral: Secondary | ICD-10-CM

## 2020-07-02 DIAGNOSIS — H35033 Hypertensive retinopathy, bilateral: Secondary | ICD-10-CM | POA: Diagnosis not present

## 2020-07-02 DIAGNOSIS — H40053 Ocular hypertension, bilateral: Secondary | ICD-10-CM

## 2020-07-02 DIAGNOSIS — H3581 Retinal edema: Secondary | ICD-10-CM

## 2020-07-02 DIAGNOSIS — I10 Essential (primary) hypertension: Secondary | ICD-10-CM | POA: Diagnosis not present

## 2020-07-02 MED ORDER — TRESIBA FLEXTOUCH 100 UNIT/ML ~~LOC~~ SOPN: 30.0000 [IU] | PEN_INJECTOR | Freq: Every day | SUBCUTANEOUS | Status: DC

## 2020-07-02 MED ORDER — BEVACIZUMAB CHEMO INJECTION 1.25MG/0.05ML SYRINGE FOR KALEIDOSCOPE
1.2500 mg | INTRAVITREAL | Status: AC | PRN
  Administered 2020-07-02: 1.25 mg via INTRAVITREAL

## 2020-07-02 MED ORDER — DORZOLAMIDE HCL-TIMOLOL MAL 2-0.5 % OP SOLN: 1.0000 [drp] | Freq: Four times a day (QID) | OPHTHALMIC | 3 refills | Status: DC

## 2020-07-02 MED ORDER — ONETOUCH VERIO VI STRP
ORAL_STRIP | 12 refills | Status: DC
Start: 2020-07-02 — End: 2020-07-08

## 2020-07-02 MED ORDER — ONETOUCH VERIO W/DEVICE KIT
PACK | 0 refills | Status: DC
Start: 2020-07-02 — End: 2020-07-08

## 2020-07-02 MED FILL — DORZOLAMIDE-TIMOLOL EYE DRP: 22.3-6.8 | 50 days supply | Qty: 10 | Fill #0

## 2020-07-02 NOTE — Progress Notes (Signed)
[facility] Note  07/02/2020     CHIEF COMPLAINT Patient presents for Retina Follow Up   HISTORY OF PRESENT ILLNESS: Charles Fox is a 69 y.o. male who presents to the clinic today for:   HPI    Retina Follow Up    Patient presents with  Diabetic Retinopathy.  In both eyes.  This started weeks ago.  Severity is moderate.  Duration of 1 week.  Since onset it is stable.  I, the attending physician,  performed the HPI with the patient and updated documentation appropriately.          Comments    69 y/o male pt here for 1 wk f/u for PDR w/DME OU.  S/p PRP OD 10.6.21.  Feels [facility] OD may be slightly improved.  No change in [place] OS.  Denies pain, FOL, floaters.  No gtts.       Last edited by Bernarda Caffey, MD on 07/02/2020  9:41 AM. (History)    pt states he had no problems after the laser procedure at last visit, no change in vision, he is still seeing floaters  Referring physician: [name], MD [address],  [place] [address]  HISTORICAL INFORMATION:   Selected notes from the MEDICAL RECORD NUMBER Referred by Dr. Zenia Resides LEE:  Ocular Hx- PMH-    CURRENT MEDICATIONS: Current Outpatient Medications (Ophthalmic Drugs)  Medication Sig  . dorzolamide-timolol (COSOPT) 22.3-6.8 MG/ML ophthalmic solution Place 1 drop into both eyes 4 (four) times daily.   No current facility-administered medications for this visit. (Ophthalmic Drugs)   Current Outpatient Medications (Other)  Medication Sig  . Alcohol Swabs PADS He is to check blood glucose 2-3 times a day  . aspirin 81 MG tablet Take 1 tablet (81 mg total) by mouth daily.  [name] atorvastatin (LIPITOR) 40 MG tablet Take 1 tablet (40 mg total) by mouth daily.  . blood glucose meter kit and supplies KIT Dispense based on patient and insurance preference. Use up to four times daily as directed. (FOR ICD-9 250.00, 250.01).  . Insulin Glargine (BASAGLAR KWIKPEN) 100 UNIT/ML   . Insulin  Syringe-Needle U-100 (INSULIN SYRINGE .5CC/30GX1/2") 30G X 1/2" 0.5 ML MISC Use with glargine. Dispose after each use.  . metFORMIN (GLUCOPHAGE-XR) 500 MG 24 hr tablet Take 1 tablet (500 mg total) by mouth 2 (two) times daily with a meal.  . OneTouch Delica Lancets 83M MISC Please use to check blood glucose once in the AM while fasting prior to giving insulin.  . Blood Glucose Monitoring Suppl (ONETOUCH VERIO) w/Device KIT Check blood glucose before giving any insulin.  [name] glucose blood (ONETOUCH VERIO) test strip Please use to check blood glucose once in the AM while fasting prior to giving insulin.  [name] insulin degludec (TRESIBA FLEXTOUCH) 100 UNIT/ML FlexTouch Pen Inject 30 Units into the skin daily before breakfast.  . losartan (COZAAR) 50 MG tablet Take 1 tablet (50 mg total) by mouth at bedtime. (Patient not taking: Reported on 06/11/2020)   No current facility-administered medications for this visit. (Other)      REVIEW OF SYSTEMS: ROS    Positive for: Endocrine, Eyes   Negative for: Constitutional, Gastrointestinal, Neurological, Skin, Genitourinary, Musculoskeletal, HENT, Cardiovascular, Respiratory, Psychiatric, Allergic/Imm, Heme/Lymph   Last edited by Matthew Folks, COA on 07/02/2020  9:22 AM. (History)       ALLERGIES No Known Allergies  PAST MEDICAL HISTORY Past Medical History:  Diagnosis Date  .  Cataract    Mixed form OU  . Diabetes mellitus without complication ([place])   . Diabetic retinopathy ([place])    PDR OU  . Hypertension   . Hypertensive retinopathy    OU   Past Surgical History:  Procedure Laterality Date  . BACK SURGERY    . FINGER SURGERY      FAMILY HISTORY History reviewed. No pertinent family history.  SOCIAL HISTORY Social History   Tobacco Use  . Smoking status: Current Every Day Smoker    Packs/day: 0.50    Types: Cigarettes  . Smokeless tobacco: Never Used  Vaping Use  . Vaping Use: Never used  Substance Use Topics  . Alcohol use:  No  . Drug use: Not Currently         OPHTHALMIC EXAM:  Base Eye Exam    Visual Acuity (Snellen - Linear)      Right Left   Dist cc 20/100 -2 20/70   Dist ph cc NI 20/60   Correction: Glasses       Tonometry (Tonopen, 9:25 AM)      Right Left   Pressure 23 25  Squeezing       Pupils      Dark Light Shape React APD   Right 3 2 Round Brisk None   Left 3 2 Round Brisk None       Visual Fields (Counting fingers)      Left Right    Full Full       Extraocular Movement      Right Left    Full, Ortho Full, Ortho       Neuro/Psych    Oriented x3: Yes   Mood/Affect: Normal       Dilation    Both eyes: 1.0% Mydriacyl, 2.5% Phenylephrine @ 9:25 AM        Slit Lamp and Fundus Exam    Slit Lamp Exam      Right Left   Lids/Lashes Dermatochalasis - upper lid, mild Meibomian gland dysfunction Dermatochalasis - upper lid, mild Meibomian gland dysfunction   Conjunctiva/Sclera Mild Melanosis Mild Melanosis   Cornea arcus, trace Punctate epithelial erosions arcus, trace Punctate epithelial erosions   Anterior Chamber Deep and quiet Deep and quiet   Iris Round and dilated, +NVI Round and dilated, +early NVI   Lens 2-3+ Nuclear sclerosis, 2-3+ Cortical cataract 2-3+ Nuclear sclerosis, 2-3+ Cortical cataract   Vitreous Vitreous syneresis, blood stained vitreous condensations settled inferiorly Vitreous syneresis, blood stained vitreous condensations improving and settled inferiorly       Fundus Exam      Right Left   Disc +NVD -- regressing, Pink and Sharp, +fibrosis superiolry Fibrotic NVD - regressing, Pink and Sharp   C/D Ratio 0.4 0.5   Macula Blunted foveal reflex, +edema/cystic changes, scattered IRH, focal exudates Flat, Blunted foveal reflex, +Cystic changes, IRH/DBH greatest temporally   Vessels Vascular attenuation, Tortuous, +NV, sclerotic arterioles nasal periphery Vascular attenuation, Tortuous, AV crossing changes, +NV   Periphery Attached, sclerotic  arterioles nasal periphery, mild White without pressure temporally, scattered IRH/DBH -- early PRP changes 360 View improved, Attached, inferior periphery partially obscured by settling VH, scattered IRH/DBH          IMAGING AND PROCEDURES  Imaging and Procedures for 07/02/2020  OCT, Retina - OU - Both Eyes       Right Eye Quality was good. Central Foveal Thickness: 385. Progression has been stable. Findings include abnormal foveal contour, intraretinal fluid, intraretinal hyper-reflective material, epiretinal  membrane, no SRF (Persistent IRF/edema/IRHM).   Left Eye Quality was borderline. Central Foveal Thickness: 346. Progression has been stable. Findings include abnormal foveal contour, intraretinal fluid, intraretinal hyper-reflective material, epiretinal membrane, no SRF (+DME - Persistent IRF/IRHM/vitreous opacities -- slightly improved).   Notes *Images captured and stored on drive  Diagnosis / Impression:  OD: +DME -- Persistent IRF/edema/IRHM OS: +DME -- Persistent IRF/IRHM/vitreous opacities -- slightly improved . Clinical management:  See below  Abbreviations: NFP - Normal foveal profile. CME - cystoid macular edema. PED - pigment epithelial detachment. IRF - intraretinal fluid. SRF - subretinal fluid. EZ - ellipsoid zone. ERM - epiretinal membrane. ORA - outer retinal atrophy. ORT - outer retinal tubulation. SRHM - subretinal hyper-reflective material. IRHM - intraretinal hyper-reflective material        Intravitreal Injection, Pharmacologic Agent - OD - Right Eye       Time Out 07/02/2020. 9:45 AM. Confirmed correct patient, procedure, site, and patient consented.   Anesthesia Topical anesthesia was used. Anesthetic medications included Lidocaine 2%, Proparacaine 0.5%.   Procedure Preparation included 5% betadine to ocular surface, eyelid speculum. A supplied needle was used.   Injection:  1.25 mg Bevacizumab (AVASTIN) SOLN   NDC: 88416-606-30, Lot:  [id number] , Expiration date: 08/06/2020   Route: Intravitreal, Site: Right Eye, Waste: 0 mL  Post-op Post injection exam found visual acuity of at least counting fingers. The patient tolerated the procedure well. There were no complications. The patient received written and verbal post procedure care education. Post injection medications were not given.        Intravitreal Injection, Pharmacologic Agent - OS - Left Eye       Time Out 07/02/2020. 9:45 AM. Confirmed correct patient, procedure, site, and patient consented.   Anesthesia Topical anesthesia was used. Anesthetic medications included Lidocaine 2%, Proparacaine 0.5%.   Procedure Preparation included 5% betadine to ocular surface, eyelid speculum. A (32g) needle was used.   Injection:  1.25 mg Bevacizumab (AVASTIN) SOLN   NDC: 16010-932-35, Lot: [id number] , Expiration date: 07/28/2020   Route: Intravitreal, Site: Left Eye, Waste: 0 mL  Post-op Post injection exam found visual acuity of at least counting fingers. The patient tolerated the procedure well. There were no complications. The patient received written and verbal post procedure care education. Post injection medications were not given.                 ASSESSMENT/PLAN:    ICD-10-CM   1. Proliferative diabetic retinopathy of both eyes with macular edema associated with type 2 diabetes mellitus (HCC)  T73.2202 Intravitreal Injection, Pharmacologic Agent - OD - Right Eye    Intravitreal Injection, Pharmacologic Agent - OS - Left Eye    Bevacizumab (AVASTIN) SOLN 1.25 mg    Bevacizumab (AVASTIN) SOLN 1.25 mg  2. Retinal edema  H35.81 OCT, Retina - OU - Both Eyes  3. Essential hypertension  I10   4. Hypertensive retinopathy of both eyes  H35.033   5. 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 Proliferative diabetic retinopathy w/ DME, OU (OS > OD) - initial exam showed +NVI OU, VH OU,  +NVD OU, +NVE OU, scattered MA/IRH OU - FA (09.10.21) shows late-leaking MA, vascular nonperfusion, +NV -- will need PRP OU  - s/p IVA OU #1 (09.10.21)  - s/p PRP OD (10.06.21) - OCT with diabetic macular edema, both eyes -- persistent OU - recommend IVA OU #2 today, 10.14.21 -  pt wishes to proceed with injections - RBA of procedure discussed, questions answered - Avastin informed consent obtained, signed and scanned, 09.10.21 - see procedure note - f/u November 1, DFE, OCT, PRP OS  3,4. Hypertensive retinopathy OU - discussed importance of tight BP control - monitor  5. Mixed Cataract OU - The symptoms of cataract, surgical options, and treatments and risks were discussed with patient. - discussed diagnosis and progression - not yet visually significant - monitor for now  6. Ocular Hypertension  - IOP today: 23,25  - start Cosopt BID OU   Ophthalmic Meds Ordered this visit:  Meds ordered this encounter  Medications  . dorzolamide-timolol (COSOPT) 22.3-6.8 MG/ML ophthalmic solution    Sig: Place 1 drop into both eyes 4 (four) times daily.    Dispense:  10 mL    Refill:  3  . Bevacizumab (AVASTIN) SOLN 1.25 mg  . Bevacizumab (AVASTIN) SOLN 1.25 mg       Return in about 18 days (around 07/20/2020) for f/u PDR OU, DFE, OCT.  There are no Patient Instructions on file for this visit.   Explained the diagnoses, plan, and follow up with the patient and they expressed understanding.  Patient expressed understanding of the importance of proper follow up care.   This document serves as a record of services personally performed by Gardiner Sleeper, MD, PhD. It was created on their behalf by San Jetty. Owens Shark, OA an ophthalmic technician. The creation of this record is the provider's dictation and/or activities during the visit.    Electronically signed by: San Jetty. Owens Shark, [place] 10.14.2021 10:26 PM  Gardiner Sleeper, M.D., Ph.D. Diseases & Surgery of the Retina and Vitreous [facility]  I have reviewed the above documentation for accuracy and completeness, and I agree with the above. Gardiner Sleeper, M.D., Ph.D. 07/02/20 10:26 PM   Abbreviations: M myopia (nearsighted); A astigmatism; H hyperopia (farsighted); P presbyopia; Mrx spectacle prescription;  CTL contact lenses; OD right eye; OS left eye; OU both eyes  XT exotropia; ET esotropia; PEK punctate epithelial keratitis; PEE punctate epithelial erosions; DES dry eye syndrome; MGD meibomian gland dysfunction; ATs artificial tears; PFAT's preservative free artificial tears; [place] nuclear sclerotic cataract; PSC posterior subcapsular cataract; ERM epi-retinal membrane; PVD posterior vitreous detachment; RD retinal detachment; DM diabetes mellitus; DR diabetic retinopathy; NPDR non-proliferative diabetic retinopathy; PDR proliferative diabetic retinopathy; CSME clinically significant macular edema; DME diabetic macular edema; dbh dot blot hemorrhages; CWS cotton wool spot; POAG primary open angle glaucoma; C/D cup-to-disc ratio; HVF humphrey visual field; GVF goldmann visual field; OCT optical coherence tomography; IOP intraocular pressure; BRVO Branch retinal vein occlusion; CRVO central retinal vein occlusion; CRAO central retinal artery occlusion; BRAO branch retinal artery occlusion; RT retinal tear; SB scleral buckle; PPV pars plana vitrectomy; VH Vitreous hemorrhage; PRP panretinal laser photocoagulation; IVK intravitreal kenalog; VMT vitreomacular traction; MH Macular hole;  NVD neovascularization of the disc; NVE neovascularization elsewhere; AREDS age related eye disease study; ARMD age related macular degeneration; POAG primary open angle glaucoma; EBMD epithelial/anterior basement membrane dystrophy; ACIOL anterior chamber intraocular lens; IOL intraocular lens; PCIOL posterior chamber intraocular lens; Phaco/IOL phacoemulsification with intraocular lens placement; [place] photorefractive keratectomy; LASIK  laser assisted in situ keratomileusis; HTN hypertension; DM diabetes mellitus; COPD chronic obstructive pulmonary disease

## 2020-07-02 NOTE — Telephone Encounter (Signed)
Phone call to patient to follow-up on blood glucose.   Patient reports he is checking his blood sugar sometimes.  He also reports that he has foot pain which prevents him from walking as much as in the past. He stated he had not discussed this with Dr. Jeannine Kitten.   He reports PM insulin  dosing is hard to remember.  He was willing to switch his [place] to the AM.   We agreed to the same dose - 30 units.   We reviewed S/Sx of hypoglycemia and also discussed checking his blood sugars if he thinks he is having any symptoms.  He was instructed to call if he sees any blood glucose readings < 80mg /dl.

## 2020-07-02 NOTE — Telephone Encounter (Signed)
Lattie Haw, [facility] RN, calls nurse line stating she went to visit patient today to check in. Lattie Haw reports he does not have any DM supplies. Lattie Haw reports he is injecting insulin, however not checking his sugars. Lattie Haw stated she is going to start going to his home 2x a week for DM teaching. Please advise on refills.

## 2020-07-03 ENCOUNTER — Telehealth: Payer: Self-pay | Admitting: Licensed Clinical Social Worker

## 2020-07-03 ENCOUNTER — Telehealth: Payer: Medicare HMO

## 2020-07-03 NOTE — Chronic Care Management (AMB) (Signed)
    Clinical Social Work  Care Management Unsuccessful Outreach   07/03/2020 Name: Charles Fox MRN: [id number] DOB: 1950-12-27  Charles Fox is a 69 y.o. year old male who is a primary care patient of [name], MD .   F/U phone call to [name] today to assist him with completing application for an apartment he want to move to.  The outreach was unsuccessful.  A HIPPA compliant phone message was left for the patient providing contact information and requesting a return call.   Plan: LCSW will wait for return call if no return call is received, will f/u with patient in 1 to 2 weeks  Review of patient status, including review of consultants reports, relevant laboratory and other test results, and collaboration with appropriate care team members and the patient's provider was performed as part of comprehensive patient evaluation and provision of care management services.    [name], [place] / [place]   [phone] 9:37 AM

## 2020-07-06 ENCOUNTER — Telehealth: Payer: Self-pay | Admitting: *Deleted

## 2020-07-06 ENCOUNTER — Ambulatory Visit: Payer: Medicare HMO

## 2020-07-06 DIAGNOSIS — E119 Type 2 diabetes mellitus without complications: Secondary | ICD-10-CM | POA: Diagnosis not present

## 2020-07-06 DIAGNOSIS — G8929 Other chronic pain: Secondary | ICD-10-CM | POA: Diagnosis not present

## 2020-07-06 DIAGNOSIS — F0280 Dementia in other diseases classified elsewhere without behavioral disturbance: Secondary | ICD-10-CM | POA: Diagnosis not present

## 2020-07-06 DIAGNOSIS — G309 Alzheimer's disease, unspecified: Secondary | ICD-10-CM | POA: Diagnosis not present

## 2020-07-06 DIAGNOSIS — I10 Essential (primary) hypertension: Secondary | ICD-10-CM | POA: Diagnosis not present

## 2020-07-06 DIAGNOSIS — M25511 Pain in right shoulder: Secondary | ICD-10-CM | POA: Diagnosis not present

## 2020-07-06 DIAGNOSIS — M25512 Pain in left shoulder: Secondary | ICD-10-CM | POA: Diagnosis not present

## 2020-07-06 DIAGNOSIS — Z6841 Body Mass Index (BMI) 40.0 and over, adult: Secondary | ICD-10-CM | POA: Diagnosis not present

## 2020-07-06 NOTE — Telephone Encounter (Signed)
Received fax stating that pts insurance prefers ACCU-CHEK and to please send new Rx for meter, strips, and lancets.Charles Fox, CMA

## 2020-07-06 NOTE — Patient Instructions (Signed)
Visit Information  Goals Addressed              This Visit's Progress   .  COMPLETED: I can't remember my ride home (pt-stated)        CARE PLAN ENTRY (see longitudinal plan of care for additional care plan information)  Current Barriers:  . Lacks caregiver support.- patient when asked states that he  has no one that he can put down as an emergency contact. . Cognitive Deficits - related to current diagnosis of dementia- Care management team was contacted by radiology due to the patient could not remember how he would get home from his test.  Nurse Case Manager Clinical Goal(s):  [name] Over the next 30 days, patient will work with Care management team to address needs related to Care  Interventions:  . Inter-disciplinary care team collaboration (see longitudinal plan of care) . Collaborated with LCSW regarding patient ongoing needs . Discussed plans with patient for ongoing care management follow up and provided patient with direct contact information for care management team . Talked with the patient on 3-way with LCSW to go over appointment for CT scan ,explaining what is was for and why he needed to keep the appointment.  . Reviewed scheduled/upcoming provider appointments including: 06/11/20 [facility] . Patient attended his appointment on 06/11/20  Patient Self Care Activities:  . Patient verbalizes understanding of plan  . Unable to independently manage transportation resources   Please see past updates related to this goal by clicking on the "Past Updates" button in the selected goal      .  I don't understand about my diabetes (pt-stated)        CARE PLAN ENTRY (see longtitudinal plan of care for additional care plan information)  Objective:  Lab Results  Component Value Date   HGBA1C 10.3 (A) 05/26/2020 .   Lab Results  Component Value Date   CREATININE 0.74 (L) 05/26/2020   CREATININE 0.68 10/15/2019   CREATININE 0.82 04/11/2019 .   [name] No results found for:  EGFR  Current Barriers:  [name] Knowledge Deficits related to basic Diabetes pathophysiology and self care/management . Difficulty obtaining or cannot afford medications . Does not have glucometer to monitor blood sugar  Case Manager Clinical Goal(s):  Over the next 90 days, patient will demonstrate improved adherence to prescribed treatment plan for diabetes self care/management as evidenced by: lower his a1c by 1-2 points . daily monitoring and recording of CBG   Interventions:  . Provided education to patient about basic DM disease process . Reviewed medications with patient and discussed importance of medication adherence . Discussed plans with patient for ongoing care management follow up and provided patient with direct contact information for care management team . Provided patient with written educational materials related to hypo and hyperglycemia and importance of correct treatment . Reviewed scheduled/upcoming provider appointments including: 03/04/20 appointment with [facility] in person for teaching about DM . Collaborated with LCSW [name] in regards to housing and food shortage . Talked with the patient about getting a glucometer to check his blood sugars.  He states that he does not have one he has spoke with his physician about getting one and RNCM has sent a message to see if there is one available.  [name] RNCM spoke with Home health nurse Jeanmarie Plant who was at the patient's home today.  They had there visit outside.  The patient said that he had fumes in the home that bothered him.  He  had his son and grandchildren at the home also.    He was unable to check his blood sugar at the time because he could not find his glucometer. But she was able to check his blood pressure.  It was 186/78. [name] stated that she was able to do some teaching with the patient but she feels that the living conditions in the home are not the best. I have asked her if she can have there social worker who  she works with to get involved with the case.  She stated that she will reach out to them and get back in touch with me.      Patient Self Care Activities:  . UNABLE to independently self manage diabetes  Please see past updates related to this goal by clicking on the "Past Updates" button in the selected goal         Charles Fox was given information about Care Management services today including:  1. Care Management services include personalized support from designated clinical staff supervised by his physician, including individualized plan of care and coordination with other care providers 2. 24/7 contact phone numbers for assistance for urgent and routine care needs. 3. The patient may stop CCM services at any time (effective at the end of the month) by phone call to the office staff.  Patient agreed to services and verbal consent obtained.   The patient verbalized understanding of instructions provided today and declined a print copy of patient instruction materials.   Plan:  RNCM will follow up with the patient over the next 14 days.  [name] RN, BSN, [facility] Care Management Coordinator [place] Phone: [phone] Fax: [phone]

## 2020-07-06 NOTE — Chronic Care Management (AMB) (Signed)
Care Management   Follow Up Note   07/06/2020 Name: Charles Fox MRN: [id number] DOB: Jan 18, 1951  Referred by: [name], MD Reason for referral : Chronic Care Management (Home Health)   Charles Fox is a 69 y.o. year old male who is a primary care patient of [name], MD. The care management team was consulted for assistance with care management and care coordination needs.    Review of patient status, including review of consultants reports, relevant laboratory and other test results, and collaboration with appropriate care team members and the patient's provider was performed as part of comprehensive patient evaluation and provision of chronic care management services.    SDOH (Social Determinants of Health) assessments performed: No See Care Plan activities for detailed interventions related to [facility])     Advanced Directives: See Care Plan and Vynca application for related entries.   Goals Addressed              This Visit's Progress   .  COMPLETED: I can't remember my ride home (pt-stated)        CARE PLAN ENTRY (see longitudinal plan of care for additional care plan information)  Current Barriers:  . Lacks caregiver support.- patient when asked states that he  has no one that he can put down as an emergency contact. . Cognitive Deficits - related to current diagnosis of dementia- Care management team was contacted by radiology due to the patient could not remember how he would get home from his test.  Nurse Case Manager Clinical Goal(s):  [name] Over the next 30 days, patient will work with Care management team to address needs related to Care  Interventions:  . Inter-disciplinary care team collaboration (see longitudinal plan of care) . Collaborated with LCSW regarding patient ongoing needs . Discussed plans with patient for ongoing care management follow up and provided patient with direct contact information for care management team . Talked with the  patient on 3-way with LCSW to go over appointment for CT scan ,explaining what is was for and why he needed to keep the appointment.  . Reviewed scheduled/upcoming provider appointments including: 06/11/20 [facility] . Patient attended his appointment on 06/11/20  Patient Self Care Activities:  . Patient verbalizes understanding of plan  . Unable to independently manage transportation resources   Please see past updates related to this goal by clicking on the "Past Updates" button in the selected goal      .  I don't understand about my diabetes (pt-stated)        CARE PLAN ENTRY (see longtitudinal plan of care for additional care plan information)  Objective:  Lab Results  Component Value Date   HGBA1C 10.3 (A) 05/26/2020 .   Lab Results  Component Value Date   CREATININE 0.74 (L) 05/26/2020   CREATININE 0.68 10/15/2019   CREATININE 0.82 04/11/2019 .   [name] No results found for: EGFR  Current Barriers:  [name] Knowledge Deficits related to basic Diabetes pathophysiology and self care/management . Difficulty obtaining or cannot afford medications . Does not have glucometer to monitor blood sugar  Case Manager Clinical Goal(s):  Over the next 90 days, patient will demonstrate improved adherence to prescribed treatment plan for diabetes self care/management as evidenced by: lower his a1c by 1-2 points . daily monitoring and recording of CBG   Interventions:  . Provided education to patient about basic DM disease process . Reviewed medications with patient and discussed importance of medication adherence .  Discussed plans with patient for ongoing care management follow up and provided patient with direct contact information for care management team . Provided patient with written educational materials related to hypo and hyperglycemia and importance of correct treatment . Reviewed scheduled/upcoming provider appointments including: 03/04/20 appointment with [facility] in person for teaching  about DM . Collaborated with LCSW [name] in regards to housing and food shortage . Talked with the patient about getting a glucometer to check his blood sugars.  He states that he does not have one he has spoke with his physician about getting one and RNCM has sent a message to see if there is one available.  [name] RNCM spoke with Home health nurse Jeanmarie Plant who was at the patient's home today.  They had there visit outside.  The patient said that he had fumes in the home that bothered him.  He had his son and grandchildren at the home also.    He was unable to check his blood sugar at the time because he could not find his glucometer. But she was able to check his blood pressure.  It was 186/78. [name] stated that she was able to do some teaching with the patient but she feels that the living conditions in the home are not the best. I have asked her if she can have there social worker who she works with to get involved with the case.  She stated that she will reach out to them and get back in touch with me.      Patient Self Care Activities:  . UNABLE to independently self manage diabetes  Please see past updates related to this goal by clicking on the "Past Updates" button in the selected goal           Plan:  RNCM will follow up with the patient over the next 14 days.  [name] RN, BSN, [facility] Care Management Coordinator [place] Phone: [phone] Fax: [phone]

## 2020-07-07 ENCOUNTER — Telehealth: Payer: Self-pay

## 2020-07-07 NOTE — Telephone Encounter (Signed)
Received message from [place]. One Touch Verio Test Strips, are not preferred by insurance. Please send new Rx for Accu-Check meter,lancet and test strips.  Ottis Stain, CMA

## 2020-07-08 ENCOUNTER — Other Ambulatory Visit: Payer: Self-pay | Admitting: Family Medicine

## 2020-07-08 MED ORDER — ACCU-CHEK AVIVA PLUS W/DEVICE KIT: PACK | 0 refills | Status: DC

## 2020-07-08 MED ORDER — ACCU-CHEK SOFTCLIX LANCETS MISC: 12 refills | Status: DC

## 2020-07-08 MED ORDER — ACCU-CHEK AVIVA PLUS VI STRP: ORAL_STRIP | 12 refills | Status: DC

## 2020-07-08 NOTE — Telephone Encounter (Signed)
Order sent.

## 2020-07-09 MED FILL — ACCU-CHEK SOFTCLIX LANCETS: 90 days supply | Qty: 100 | Fill #0

## 2020-07-09 MED FILL — ACCU-CHEK GUIDE TEST STRIP: 50 days supply | Qty: 50 | Fill #0

## 2020-07-09 MED FILL — ACCU-CHEK GUIDE W/DEVICE KI: W/DEVICE | 1 days supply | Qty: 1 | Fill #0

## 2020-07-13 DIAGNOSIS — M25512 Pain in left shoulder: Secondary | ICD-10-CM | POA: Diagnosis not present

## 2020-07-13 DIAGNOSIS — F0280 Dementia in other diseases classified elsewhere without behavioral disturbance: Secondary | ICD-10-CM | POA: Diagnosis not present

## 2020-07-13 DIAGNOSIS — E119 Type 2 diabetes mellitus without complications: Secondary | ICD-10-CM | POA: Diagnosis not present

## 2020-07-13 DIAGNOSIS — M25511 Pain in right shoulder: Secondary | ICD-10-CM | POA: Diagnosis not present

## 2020-07-13 DIAGNOSIS — G8929 Other chronic pain: Secondary | ICD-10-CM | POA: Diagnosis not present

## 2020-07-13 DIAGNOSIS — Z6841 Body Mass Index (BMI) 40.0 and over, adult: Secondary | ICD-10-CM | POA: Diagnosis not present

## 2020-07-13 DIAGNOSIS — G309 Alzheimer's disease, unspecified: Secondary | ICD-10-CM | POA: Diagnosis not present

## 2020-07-13 DIAGNOSIS — I10 Essential (primary) hypertension: Secondary | ICD-10-CM | POA: Diagnosis not present

## 2020-07-14 ENCOUNTER — Ambulatory Visit: Payer: Medicare HMO | Admitting: Licensed Clinical Social Worker

## 2020-07-14 DIAGNOSIS — Z7189 Other specified counseling: Secondary | ICD-10-CM

## 2020-07-14 NOTE — Chronic Care Management (AMB) (Signed)
Care Management   Clinical Social Work Follow Up   07/14/2020 Name: Charles Fox MRN: [id number] DOB: 11/16/1950 Referred by: [name], MD  Reason for referral : [place] is a 69 y.o. year old male who is a primary care patient of [name], MD.  Reason for follow-up: assess for barriers and progress with housing goal .    Assessment: Patient continues to express concern with affordable housing. States someone from [employer] called him about a unit in [place].   Current Barriers:  Getting birth certificate Plan:  LCSW will f/u with patient in 7 to 10 days to provide support and encouragement with this process   Advance Directive Status: not addressed during this encounter.  SDOH (Social Determinants of Health) assessments performed:  No new needs identified   Goals Addressed            This Visit's Progress   . I need new housing   Not on track     [place] (see longtitudinal plan of care for additional care plan information)  Current Barriers:  . Unable to navigate systems to locate housing Clinical Goal(s) : assist with navigating housing options  Interventions provided by LCSW:  . Assessment of needs, barriers , agencies contacted, as well as how impacting    . Contacted [address] and was given intake contact  [facility] [phone] and Shanon Ace [phone]) . Solution-Focused Strategies;Problem Solving   Patient Self Care Activities   . Get birth certificate the first of Nov. When he get his check . Call person from [employer] to take all information     Outpatient Encounter Medications as of 07/14/2020  Medication Sig Note  . Accu-Chek Softclix Lancets lancets Use as instructed   . Alcohol Swabs PADS He is to check blood glucose 2-3 times a day   . aspirin 81 MG tablet Take 1 tablet (81 mg total) by mouth daily. 06/11/2020: Taking two pills  . atorvastatin  (LIPITOR) 40 MG tablet Take 1 tablet (40 mg total) by mouth daily. 06/11/2020: Out of meds  . blood glucose meter kit and supplies KIT Dispense based on patient and insurance preference. Use up to four times daily as directed. (FOR ICD-9 250.00, 250.01).   . Blood Glucose Monitoring Suppl (ACCU-CHEK AVIVA PLUS) w/Device KIT Check blood glucose before in AM while fasting.   . dorzolamide-timolol (COSOPT) 22.3-6.8 MG/ML ophthalmic solution Place 1 drop into both eyes 4 (four) times daily.   [name] glucose blood (ACCU-CHEK AVIVA PLUS) test strip Use as instructed   . insulin degludec (TRESIBA FLEXTOUCH) 100 UNIT/ML FlexTouch Pen Inject 30 Units into the skin daily before breakfast.   . Insulin Glargine (BASAGLAR KWIKPEN) 100 UNIT/ML    . Insulin Syringe-Needle U-100 (INSULIN SYRINGE .5CC/30GX1/2") 30G X 1/2" 0.5 ML MISC Use with glargine. Dispose after each use.   . losartan (COZAAR) 50 MG tablet Take 1 tablet (50 mg total) by mouth at bedtime. (Patient not taking: Reported on 06/11/2020)   . metFORMIN (GLUCOPHAGE-XR) 500 MG 24 hr tablet Take 1 tablet (500 mg total) by mouth 2 (two) times daily with a meal.    No facility-administered encounter medications on file as of 07/14/2020.   Review of patient status, including review of consultants reports, relevant laboratory and other test results, and collaboration with appropriate care team members and the patient's provider was performed as part of comprehensive patient evaluation and provision of  care management services.   [name], [employer] / [place]   [phone] 12:02 PM

## 2020-07-15 ENCOUNTER — Ambulatory Visit: Payer: Medicare HMO

## 2020-07-15 ENCOUNTER — Ambulatory Visit: Payer: Self-pay | Admitting: Licensed Clinical Social Worker

## 2020-07-15 NOTE — Chronic Care Management (AMB) (Signed)
RN  Care Management   Follow Up Note   07/15/2020 Name: Charles Fox MRN: [id number] DOB: 1951-01-29  Reason for referral : Chronic Care Management (DM II)   Charles Fox is a 69 y.o. year old male who is a primary care patient of Charles Pike, MD. The care management team was consulted for assistance with care management and care coordination needs.    Subjective: " I am not doing to good "   Assessment: Called to follow up on the patient about his diabetes.  The patient state that he has  Not food in his house and he hadn ot eaten anything this morning.   Goals Addressed              This Visit's Progress   .  I don't understand about my diabetes (pt-stated)        CARE PLAN ENTRY (see longtitudinal plan of care for additional care plan information)  Objective:  Lab Results  Component Value Date   HGBA1C 10.3 (A) 05/26/2020 .   Lab Results  Component Value Date   CREATININE 0.74 (L) 05/26/2020   CREATININE 0.68 10/15/2019   CREATININE 0.82 04/11/2019 .   [name] No results found for: EGFR  Current Barriers:  [name] Knowledge Deficits related to basic Diabetes pathophysiology and self care/management . Difficulty obtaining or cannot afford medications . Does not have glucometer to monitor blood sugar  Case Manager Clinical Goal(s):  Over the next 90 days, patient will demonstrate improved adherence to prescribed treatment plan for diabetes self care/management as evidenced by: lower his a1c by 1-2 points . daily monitoring and recording of CBG   Interventions:  . Provided education to patient about basic DM disease process . Reviewed medications with patient and discussed importance of medication adherence . Discussed plans with patient for ongoing care management follow up and provided patient with direct contact information for care management team . Provided patient with written educational materials related to hypo and hyperglycemia and importance of  correct treatment . Reviewed scheduled/upcoming provider appointments including: 03/04/20 appointment with [facility] in person for teaching about DM . Collaborated with LCSW [name] in regards to housing and food shortage . Talked with the patient about getting a glucometer to check his blood sugars.  He states that he does not have one he has spoke with his physician about getting one and RNCM has sent a message to see if there is one available.  [name] RNCM called and spoke with the patient and he states that he was not doing well.  He does not have food in the house.  His food stamp card was cut off because he was sent a paper to sign and he did not return it.  He states he never received it but they are going to send him another paper.  Advised the patient to make sure that he follows up with the Department for his food stamps. [name] RNCM took an emergency box of food and put it up at the front and resource for other places the patient can get food.  Notified him that he can come and pick it up.  He verbalized understanding. [name] He states the nurse [name] from [facility]  [phone] will be coming to the home next Wednesday for a follow up.  RNCM called and left a message for her to return the call. . Patient gave number to give to [name] to call regarding housing [phone].  Number given.  Patient Self Care Activities:  . UNABLE to independently self manage diabetes  Please see past updates related to this goal by clicking on the "Past Updates" button in the selected goal         Review of patient status, including review of consultants reports, relevant laboratory and other test results, and collaboration with appropriate care team members and the patient's provider was performed as part of comprehensive patient evaluation and provision of chronic care management services.    SDOH (Social Determinants of Health) assessments performed: Yes See Care Plan activities for detailed  interventions related to SDOH)         Plan: Telephone follow up with [name] over the next 14 days.  Will collaborate with [name] who has a scheduled call with the patient next week.   [name] RN, BSN, [facility] Care Management Coordinator [place] Phone: [phone] Fax: [phone]

## 2020-07-15 NOTE — Chronic Care Management (AMB) (Signed)
   Social Work  Care Management Collaboration 07/15/2020 Name: Charles Fox MRN: [id number] DOB: 27-Mar-1951 Charles Fox is a 69 y.o. year old male who sees Charles Pike, MD for primary care. LCSW was consulted by CCM RN to assistance patient food resources.  Patient reports not having gas to get home.   Intervention: Patient was not interviewed or contacted during this encounter.  LCSW collaborated with CCM RN to assist with obtaining food box from [facility] and $10.00 for gas to get back home.  Review of patient status, including review of consultants reports, relevant laboratory and other test results, and collaboration with appropriate care team members and the patient's provider was performed as part of comprehensive patient evaluation and provision of chronic care management services.    Plan: CCM Team will continue to follow up patient as needed     [name], [place] / [place]   [phone] 4:19 PM

## 2020-07-20 ENCOUNTER — Ambulatory Visit: Payer: Medicare HMO

## 2020-07-20 ENCOUNTER — Encounter (INDEPENDENT_AMBULATORY_CARE_PROVIDER_SITE_OTHER): Payer: Medicare HMO | Admitting: Ophthalmology

## 2020-07-20 DIAGNOSIS — H3581 Retinal edema: Secondary | ICD-10-CM

## 2020-07-20 DIAGNOSIS — H25813 Combined forms of age-related cataract, bilateral: Secondary | ICD-10-CM

## 2020-07-20 DIAGNOSIS — H35033 Hypertensive retinopathy, bilateral: Secondary | ICD-10-CM

## 2020-07-20 DIAGNOSIS — E113513 Type 2 diabetes mellitus with proliferative diabetic retinopathy with macular edema, bilateral: Secondary | ICD-10-CM

## 2020-07-20 DIAGNOSIS — I10 Essential (primary) hypertension: Secondary | ICD-10-CM

## 2020-07-20 DIAGNOSIS — H40053 Ocular hypertension, bilateral: Secondary | ICD-10-CM

## 2020-07-20 NOTE — Chronic Care Management (AMB) (Signed)
   RNCM Care Management Collaboration 07/20/2020 Name: MEKAI WILKINSON MRN: [id number] DOB: [birth year]   Charles Fox is a 69 y.o. year old male who sees [name], MD for primary care.  RNCM collaborated with Tera Partridge RN    Intervention: Patient was not interviewed or contacted during this encounter. Review of patient status, including review of consultants reports, relevant laboratory and other test results, and collaboration with appropriate care team members and the patient's provider was performed as part of comprehensive patient evaluation and provision of chronic care management services.  Spoke with [name] this morning inquiring when will the social worker from [facility] become involved with the case. I informed her about Mr. Fitterer coming to the office last week stating that he had no food in the home. She stated that she has sent a letter to the appropriate people at her job but will also call her supervisor to find out the progress and give them my contact information.    Plan:  1. Will keep LCSW Laurance Flatten informed of information. 2. Will wait for contact from [place], BSN, [place] Phone: [phone] Fax: [phone]

## 2020-07-21 ENCOUNTER — Ambulatory Visit: Payer: Medicare HMO | Admitting: Licensed Clinical Social Worker

## 2020-07-21 DIAGNOSIS — Z789 Other specified health status: Secondary | ICD-10-CM

## 2020-07-21 NOTE — Chronic Care Management (AMB) (Signed)
Care Management   Clinical Social Work Follow Up   07/21/2020 Name: COALTON ARCH MRN: [id number] DOB: 1950-11-15 Referred by: [name], MD  Reason for referral : Care Coordination (housing)  Charles Fox is a 69 y.o. year old male who is a primary care patient of [name], MD.  Reason for follow-up: assess for barriers and progress with housing goal .    Assessment: Patient continues to experience difficulty with navigating housing barrier. Son and grandchildren continue to live with him. He is in consistent with saying he has food then he has no food.  Collaborated with CCM RN with providing foodbox for patient at last encounter.  Recommendation: Patient may benefit from, [facility] Social Worker to assess home needs.  Order has been placed by PCP.  Plan:  LCSW will continue to collaborate with CCM RN and outreach to [facility] to provided needed support.   Will f/u with patient once able to reach [place] housing intake person to discuss next steps  Advance Directive Status: ; not addressed during this encounter.  SDOH (Social Determinants of Health) assessments performed:  No new needs identified   Patient Care Plan: obtain new housing  Problem Identified: unable to afford current housing   Priority: Medium  Long-Range Goal: Comfort Maintained with affordable housing   Start Date: 07/15/2020  Expected End Date: 09/18/2020  This Visit's Progress: On track  Priority: Medium  Current Barriers: Unable to navigate systems to complete process to get housing unit.  Can't remember what Bertie told him to do in order to the unit at [employer]) : assist with navigating housing options  Interventions provided by LCSW:  . Assessment of needs, barriers and progress   . Left voice message and e-mail sent to Shanon Ace [phone] intake person at [place]  . Reminded patient to get birth certificate . Solution-Focused  Strategies;Problem Solving      Outpatient Encounter Medications as of 07/21/2020  Medication Sig Note  . Accu-Chek Softclix Lancets lancets Use as instructed   . Alcohol Swabs PADS He is to check blood glucose 2-3 times a day   . aspirin 81 MG tablet Take 1 tablet (81 mg total) by mouth daily. 06/11/2020: Taking two pills  . atorvastatin (LIPITOR) 40 MG tablet Take 1 tablet (40 mg total) by mouth daily. 06/11/2020: Out of meds  . blood glucose meter kit and supplies KIT Dispense based on patient and insurance preference. Use up to four times daily as directed. (FOR ICD-9 250.00, 250.01).   . Blood Glucose Monitoring Suppl (ACCU-CHEK AVIVA PLUS) w/Device KIT Check blood glucose before in AM while fasting.   . dorzolamide-timolol (COSOPT) 22.3-6.8 MG/ML ophthalmic solution Place 1 drop into both eyes 4 (four) times daily.   [name] glucose blood (ACCU-CHEK AVIVA PLUS) test strip Use as instructed   . insulin degludec (TRESIBA FLEXTOUCH) 100 UNIT/ML FlexTouch Pen Inject 30 Units into the skin daily before breakfast.   . Insulin Glargine (BASAGLAR KWIKPEN) 100 UNIT/ML    . Insulin Syringe-Needle U-100 (INSULIN SYRINGE .5CC/30GX1/2") 30G X 1/2" 0.5 ML MISC Use with glargine. Dispose after each use.   . losartan (COZAAR) 50 MG tablet Take 1 tablet (50 mg total) by mouth at bedtime. (Patient not taking: Reported on 06/11/2020)   . metFORMIN (GLUCOPHAGE-XR) 500 MG 24 hr tablet Take 1 tablet (500 mg total) by mouth 2 (two) times daily with a meal.    Review of patient status, including review  of consultants reports, relevant laboratory and other test results, and collaboration with appropriate care team members and the patient's provider was performed as part of comprehensive patient evaluation and provision of care management services.   [name], [place] / [place]   [phone] 11:22 AM

## 2020-07-21 NOTE — Patient Instructions (Signed)
°  [name]  it was nice speaking with you. Please call me directly [phone] if you have questions about the goals we discussed. Goals Addressed            This Visit's Progress    I need new housing   Not on track    Patient Activities    Get birth certificate tomorrow.   Call person from [employer] to take all information      [name] received Care Management services today:  1. Care Management services include personalized support from designated clinical staff supervised by his physician, including individualized plan of care and coordination with other care providers 2. 24/7 contact [phone] for assistance for urgent and routine care needs. 3. Care Management are voluntary services and be declined at any time by calling the office.  Patient verbalizes understanding of instructions provided today.  Follow up plan: SW will follow up with patient by phone over the next few days or week as needed  Maurine Cane, LCSW

## 2020-07-22 ENCOUNTER — Ambulatory Visit: Payer: Medicare HMO

## 2020-07-22 DIAGNOSIS — G309 Alzheimer's disease, unspecified: Secondary | ICD-10-CM | POA: Diagnosis not present

## 2020-07-22 DIAGNOSIS — H547 Unspecified visual loss: Secondary | ICD-10-CM

## 2020-07-22 DIAGNOSIS — G8929 Other chronic pain: Secondary | ICD-10-CM | POA: Diagnosis not present

## 2020-07-22 DIAGNOSIS — I10 Essential (primary) hypertension: Secondary | ICD-10-CM | POA: Diagnosis not present

## 2020-07-22 DIAGNOSIS — E119 Type 2 diabetes mellitus without complications: Secondary | ICD-10-CM | POA: Diagnosis not present

## 2020-07-22 DIAGNOSIS — M25511 Pain in right shoulder: Secondary | ICD-10-CM | POA: Diagnosis not present

## 2020-07-22 DIAGNOSIS — Z6841 Body Mass Index (BMI) 40.0 and over, adult: Secondary | ICD-10-CM | POA: Diagnosis not present

## 2020-07-22 DIAGNOSIS — M25512 Pain in left shoulder: Secondary | ICD-10-CM | POA: Diagnosis not present

## 2020-07-22 DIAGNOSIS — F0280 Dementia in other diseases classified elsewhere without behavioral disturbance: Secondary | ICD-10-CM | POA: Diagnosis not present

## 2020-07-22 MED FILL — METFORMIN HCL ER 500 MG TB2: 500 | 45 days supply | Qty: 90 | Fill #0

## 2020-07-22 MED FILL — ACCU-CHEK GUIDE W/DEVICE KI: W/DEVICE | 1 days supply | Qty: 1 | Fill #0

## 2020-07-22 MED FILL — ACCU-CHEK SOFTCLIX LANCETS: 90 days supply | Qty: 100 | Fill #0

## 2020-07-22 MED FILL — ATORVASTATIN 40 MG TABLET: 40 | 30 days supply | Qty: 30 | Fill #4

## 2020-07-22 MED FILL — ACCU-CHEK GUIDE TEST STRIP: 50 days supply | Qty: 50 | Fill #0

## 2020-07-22 MED FILL — LOSARTAN POTASSIUM 50 MG TA: 50 | 30 days supply | Qty: 30 | Fill #2

## 2020-07-22 MED FILL — DORZOLAMIDE-TIMOLOL EYE DRP: 22.3-6.8 | 50 days supply | Qty: 10 | Fill #0

## 2020-07-22 NOTE — Patient Instructions (Signed)
Visit Information  Goals Addressed              This Visit's Progress   .  I don't understand about my diabetes (pt-stated)        CARE PLAN ENTRY (see longtitudinal plan of care for additional care plan information)  Objective:  Lab Results  Component Value Date   HGBA1C 10.3 (A) 05/26/2020 .   Lab Results  Component Value Date   CREATININE 0.74 (L) 05/26/2020   CREATININE 0.68 10/15/2019   CREATININE 0.82 04/11/2019 .   [name] No results found for: EGFR  Current Barriers:  [name] Knowledge Deficits related to basic Diabetes pathophysiology and self care/management . Difficulty obtaining or cannot afford medications . Does not have glucometer to monitor blood sugar  Case Manager Clinical Goal(s):  Over the next 90 days, patient will demonstrate improved adherence to prescribed treatment plan for diabetes self care/management as evidenced by: lower his a1c by 1-2 points . daily monitoring and recording of CBG   Interventions:  . Provided education to patient about basic DM disease process . Reviewed medications with patient and discussed importance of medication adherence . Discussed plans with patient for ongoing care management follow up and provided patient with direct contact information for care management team . Provided patient with written educational materials related to hypo and hyperglycemia and importance of correct treatment . Reviewed scheduled/upcoming provider appointments including: 03/04/20 appointment with [facility] in person for teaching about DM . Collaborated with LCSW [name] in regards to housing and food shortage . Talked with the patient about getting a glucometer to check his blood sugars.  He states that he does not have one he has spoke with his physician about getting one and RNCM has sent a message to see if there is one available.  . Patient called in the office this afternoon with home health nurse [name] and stated that huis medications was going  to cost him 91.00 dollars.  I told him we had already had a pharmacist to evaluat his medication and it should not be that much. [name] RNCM called [place] and spoke with [name] on 3 way with the patient and [name] on the other line.  She stated that all of Mr Veno's medication plus strips and lancets would be 20 dollars.  His [name] was 35 dollars but he had samples in the  refrigerator.   He has a Prescription for Prednisone Drops for his eyes that is 38.70  but he said he is not going to be able to get it at this time.  RNCM will send a referral to pharmacy for the patient to see if he can get some type of assistance.   Patient Self Care Activities:  . UNABLE to independently self manage diabetes  Please see past updates related to this goal by clicking on the "Past Updates" button in the selected goal         Mr. Bascom was given information about Care Management services today including:  1. Care Management services include personalized support from designated clinical staff supervised by his physician, including individualized plan of care and coordination with other care providers 2. 24/7 contact phone numbers for assistance for urgent and routine care needs. 3. The patient may stop CCM services at any time (effective at the end of the month) by phone call to the office staff.  Patient agreed to services and verbal consent obtained.   The patient verbalized understanding of instructions provided today  and declined a print copy of patient instruction materials.   Plan: Telephone follow up with [name] over the next 14 days. [name] RN, BSN, [facility] Care Management Coordinator [place] Phone: [phone] Fax: [phone]

## 2020-07-22 NOTE — Chronic Care Management (AMB) (Signed)
RN  Care Management   Follow Up Note   07/22/2020 Name: [name] MRN: [id number] DOB: December 16, 1950  Reason for referral : Care Coordination (Medications)   Charles Fox is a 69 y.o. year old male who is a primary care patient of Charles Pike, MD. The care management team was consulted for assistance with care management and care coordination needs.    Subjective: " MY medications cost 98 dollars."   Assessment: RNCM was called by the patient to let me know that his medications was going to cost him 98 dollars and he is unable to afford the medications.   Goals Addressed              This Visit's Progress   .  I don't understand about my diabetes (pt-stated)        CARE PLAN ENTRY (see longtitudinal plan of care for additional care plan information)  Objective:  Lab Results  Component Value Date   HGBA1C 10.3 (A) 05/26/2020 .   Lab Results  Component Value Date   CREATININE 0.74 (L) 05/26/2020   CREATININE 0.68 10/15/2019   CREATININE 0.82 04/11/2019 .   [name] No results found for: EGFR  Current Barriers:  [name] Knowledge Deficits related to basic Diabetes pathophysiology and self care/management . Difficulty obtaining or cannot afford medications . Does not have glucometer to monitor blood sugar  Case Manager Clinical Goal(s):  Over the next 90 days, patient will demonstrate improved adherence to prescribed treatment plan for diabetes self care/management as evidenced by: lower his a1c by 1-2 points . daily monitoring and recording of CBG   Interventions:  . Provided education to patient about basic DM disease process . Reviewed medications with patient and discussed importance of medication adherence . Discussed plans with patient for ongoing care management follow up and provided patient with direct contact information for care management team . Provided patient with written educational materials related to hypo and hyperglycemia and importance of correct  treatment . Reviewed scheduled/upcoming provider appointments including: 03/04/20 appointment with [facility] in person for teaching about DM . Collaborated with LCSW Charles Fox in regards to housing and food shortage . Talked with the patient about getting a glucometer to check his blood sugars.  He states that he does not have one he has spoke with his physician about getting one and RNCM has sent a message to see if there is one available.  . Patient called in the office this afternoon with home health nurse Charles Fox and stated that huis medications was going to cost him 91.00 dollars.  I told him we had already had a pharmacist to evaluat his medication and it should not be that much. [name] RNCM called [place] and spoke with Charles Fox on 3 way with the patient and Charles Fox on the other line.  She stated that all of Charles Fox's medication plus strips and lancets would be 20 dollars.  His Charles Fox was 35 dollars but he had samples in the  refrigerator.   He has a Prescription for Prednisone Drops for his eyes that is 38.70  but he said he is not going to be able to get it at this time.  RNCM will send a referral to pharmacy for the patient to see if he can get some type of assistance.   Patient Self Care Activities:  . UNABLE to independently self manage diabetes  Please see past updates related to this goal by clicking on the "Past Updates"  button in the selected goal         Review of patient status, including review of consultants reports, relevant laboratory and other test results, and collaboration with appropriate care team members and the patient's provider was performed as part of comprehensive patient evaluation and provision of chronic care management services.    SDOH (Social Determinants of Health) assessments performed: Yes See Care Plan activities for detailed interventions related to SDOH)         Plan: Telephone follow up with Charles Fox over the next 14  days. Charles Arms RN, BSN, [facility] Care Management Coordinator [place] Phone: [phone] Fax: [phone]

## 2020-07-24 ENCOUNTER — Telehealth: Payer: Self-pay

## 2020-07-24 NOTE — Telephone Encounter (Signed)
Charles Fox, social work, LVM on [employer], [employer] requesting verbal orders for [employer]. Charles Fox reports concerns for patients safety.   Charles Fox contacted and VM left giving verbal order for medical social worker.

## 2020-07-27 ENCOUNTER — Encounter (INDEPENDENT_AMBULATORY_CARE_PROVIDER_SITE_OTHER): Payer: Medicare HMO | Admitting: Ophthalmology

## 2020-07-28 ENCOUNTER — Telehealth: Payer: Self-pay

## 2020-07-28 ENCOUNTER — Telehealth: Payer: Medicare HMO

## 2020-07-28 NOTE — Telephone Encounter (Signed)
  Care Management   Outreach Note  07/28/2020 Name: Charles Fox MRN: [id number] DOB: [birth year]  Referred by: [name], MD Reason for referral : Appointment (DM II)   An unsuccessful telephone outreach was attempted today. The patient was referred to the case management team for assistance with care management and care coordination.  2 attempt made to call the patient   Follow Up Plan: A HIPAA compliant phone message was left for the patient providing contact information and requesting a return call.  The care management team will reach out to the patient again over the next 7-10 days.   [name] RN, BSN, [facility] Care Management Coordinator [place] Phone: [phone] Fax: [phone]

## 2020-07-31 ENCOUNTER — Telehealth: Payer: Self-pay | Admitting: Licensed Clinical Social Worker

## 2020-07-31 ENCOUNTER — Telehealth: Payer: Self-pay | Admitting: *Deleted

## 2020-07-31 NOTE — Chronic Care Management (AMB) (Signed)
  Care Management   Note  07/31/2020 Name: [name] MRN: [id number] DOB: 1950/12/27  Charles Fox is a 69 y.o. year old male who is a primary care patient of [name], MD and is actively engaged with the care management team. I reached out to [name] by phone today to assist with re-scheduling a follow up visit with the RN Case Manager.  Follow up plan: Unsuccessful telephone outreach attempt made. A HIPAA compliant phone message was left for the patient providing contact information and requesting a return call.  The care management team will reach out to the patient again over the next 7 days.  If patient returns call to provider office, please advise to call [place] at [phone].  [place] Management  Direct Dial: [phone]

## 2020-07-31 NOTE — Chronic Care Management (AMB) (Signed)
    Clinical Social Work  Care Management Outreach   07/31/2020 Name: Charles Fox MRN: [id number] DOB: September 14, 1951  Charles Fox is a 69 y.o. year old male who is a primary care patient of Charles Pike, MD .   F/U phone call to [name] today to assess needs, and progress with care plan goal of moving to a new place. Patient was to get birth certificate and take to [employer].  The outreach was unsuccessful.  A HIPPA compliant phone message was left for the patient providing contact information and requesting a return call.   Intervention : f/u call to [place] snider [phone] with [facility], left voice message requesting a return call.  Plan: If no return call is received, will call patient again in 5 to 7 days.  Review of patient status, including review of consultants reports, relevant laboratory and other test results, and collaboration with appropriate care team members and the patient's provider was performed as part of comprehensive patient evaluation and provision of care management services.    [name], [place] / [place]   [phone] 9:23 AM

## 2020-08-05 ENCOUNTER — Ambulatory Visit: Payer: Medicare HMO

## 2020-08-05 NOTE — Chronic Care Management (AMB) (Signed)
  Care Management     Care Management Outreach Note  08/05/2020 Name: AUDRICK LAMOUREAUX MRN: [id number] DOB: 06-29-1951  DEMARIO FANIEL is a 69 y.o. year old male who is a primary care patient of Jeannine Kitten, Garen Lah, MD .  [place] was called by Jeanmarie Plant RN with [facility] for  [name] today by phone.  She states that she spoke with her supervisor and  they had a Education officer, museum to go out to the home and the son stopped the Education officer, museum at the door and would not let the person  in.  Attempts had been made for contact.  [name] states that an APS referral will be made today.      Follow Up Plan: RNCM will stay in contact with [facility] for follow up.  [name] RN, BSN, [facility] Care Management Coordinator [place] Phone: [phone] Fax: [phone]

## 2020-08-06 NOTE — Chronic Care Management (AMB) (Signed)
  Care Management   Note  08/06/2020 Name: Charles Fox MRN: [id number] DOB: Feb 14, 1951  Charles Fox is a 69 y.o. year old male who is a primary care patient of [name], MD and is actively engaged with the care management team. I reached out to [name] by phone today to assist with re-scheduling a follow up visit with the RN Case Manager.  Follow up plan: Unsuccessful telephone outreach attempt made. The care management team will reach out to the patient again over the next 7 days. If patient returns call to provider office, please advise to call [place] at [phone].  [place] Management  Direct Dial: [phone]

## 2020-08-07 ENCOUNTER — Telehealth: Payer: Self-pay

## 2020-08-07 NOTE — Telephone Encounter (Signed)
  Care Management     Care Management Outreach Note  08/07/2020 Name: Charles Fox MRN: [id number] DOB: Oct 26, 1950  Charles Fox is a 69 y.o. year old male who is a primary care patient of Jeannine Kitten, Garen Lah, MD . [place] received a call from Jeanmarie Plant RN from [facility] to in form me that she placed the APS referral for  Charles Fox today.     Follow Up Plan: RNCM will wait for follow up from Mrs Edison Pace.   [name] RN, BSN, [facility] Care Management Coordinator [place] Phone: [phone] I Fax: [phone]

## 2020-08-07 NOTE — Telephone Encounter (Signed)
Benjamine Mola, Midwife at [facility], calls nurse line to report APS case. Benjamine Mola reports the son of patient is not allowing her staff in the him home for home health visits. Benjamine Mola reports the patient told "someone" he has not been given any food in days or his medications. Benjamine Mola just wanted PCP to made aware of the situation and if there is anything she needs from [place] she will call back.

## 2020-08-11 ENCOUNTER — Telehealth: Payer: Self-pay | Admitting: Licensed Clinical Social Worker

## 2020-08-11 NOTE — Chronic Care Management (AMB) (Signed)
   Social Work Care Management  3rd Unsuccessful  Phone Outreach  08/11/2020 Name: Charles Fox MRN: [id number] DOB: 28-Jul-1951  Referred by: [name], MD Reason for referral : Care Coordination (f/u) ;Charles Fox is a 69 y.o. year old male who sees [name], MD for primary care.3rd unsuccessful telephone outreach attempt to [name] today. Unable to leave a HIPPA compliant phone message as voice box is full.   CCM RN, [place] NR and [place] social worker also not able to reach or contact patient.  Home health social work filed adult protective service report after going to the home and the son would not let them into the home.   Review of patient status, including review of consultants reports, relevant laboratory and other test results, and collaboration with appropriate care team members and the patient's provider was performed as part of comprehensive patient evaluation and provision of care management services.   Plan: LCSW will discontinue outreach calls but will gladly be available at any time to provide services to [name]. Will wait for update from APS or home health team.  [name], [place] / [place]   [phone] 2:31 PM

## 2020-08-12 NOTE — Chronic Care Management (AMB) (Signed)
  Care Management   Note  08/12/2020 Name: [name] MRN: [id number] DOB: 22-Jun-1951  Charles Fox is a 69 y.o. year old male who is a primary care patient of [name], MD and is actively engaged with the care management team. I reached out to [name] by phone today to assist with re-scheduling a follow up visit with the RN Case Manager  Follow up plan: Unsuccessful telephone outreach attempt made. A HIPAA compliant phone message was left for the patient providing contact information and requesting a return call. Unable to make contact on outreach attempts x 3. PCP [name], MD. notified via routed documentation in medical record.  Appropriate care team members and provider have been notified via electronic communication.   [place] Management  Direct Dial: [phone]

## 2020-08-21 MED FILL — METFORMIN HCL ER 500 MG TB2: 500 | 30 days supply | Qty: 60 | Fill #0

## 2020-08-21 MED FILL — ATORVASTATIN 40 MG TABLET: 40 | 30 days supply | Qty: 30 | Fill #4

## 2020-08-21 MED FILL — LOSARTAN POTASSIUM 50 MG TA: 50 | 30 days supply | Qty: 30 | Fill #2

## 2020-08-27 ENCOUNTER — Telehealth: Payer: Self-pay | Admitting: Family Medicine

## 2020-08-27 NOTE — Telephone Encounter (Signed)
Pt walked in stating he is unable to get his insulin because he does not have any money - can't afford. Pls call [phone]

## 2020-09-02 ENCOUNTER — Ambulatory Visit: Payer: Self-pay | Admitting: Licensed Clinical Social Worker

## 2020-09-02 ENCOUNTER — Telehealth: Payer: Self-pay

## 2020-09-02 NOTE — Telephone Encounter (Signed)
   RNCM Care Management Collaboration 09/02/2020 Name: Charles Fox MRN: [id number] DOB: 1951-08-23   Charles Fox is a 69 y.o. year old male who sees Charles Pike, MD for primary care. RNCM was notified by LCSW Charles Fox that the patient had come into the office requesting insulin.   Intervention: RNCM called the patient at the number given and called his son Charles Fox who the patient gave permission to call and left HIPAA compliant message to call me and the contact number.    Plan:  RNCM will follow up with the patient for ongoing assessment of needs.  Charles Arms RN, BSN, [facility] Care Management Coordinator [place] Phone: [phone] I Fax: [phone]

## 2020-09-02 NOTE — Chronic Care Management (AMB) (Signed)
° °  Social Work  Care Management Collaboration 09/02/2020 Name: KENTARIUS PARTINGTON MRN: [id number] DOB: 1950-12-07 KAYDON HUSBY is a 69 y.o. year old male who sees [name], MD for primary care.   Intervention: Patient was not interviewed or contacted during this encounter.  LCSW collaborated with CCM RN after reviewing note that patient presented to [facility] office 08/27/20 stating he needed help with getting medication and did not have any money .  Consulted with [facility] pharmacy, CCM RN attempted to contact patient and son.  Left voice message  LCSW called [facility] APS to add additional information to APS report  Per [place] work previously filed an APS report with DSS.  Review of patient status, including review of consultants reports, relevant laboratory and other test results, and collaboration with appropriate care team members and the patient's provider was performed as part of comprehensive patient evaluation and provision of chronic care management services.    Plan: APS will call LCSW if needed.  CCM team will continue to outreach to patient if no return call is received.  [name], [place] / [place]   [phone] 1:45 PM

## 2020-09-03 ENCOUNTER — Telehealth: Payer: Self-pay

## 2020-09-03 ENCOUNTER — Telehealth: Payer: Self-pay | Admitting: Licensed Clinical Social Worker

## 2020-09-03 NOTE — Telephone Encounter (Signed)
° °  RNCM Care Management Collaboration 09/03/2020 Name: Charles Fox MRN: [id number] DOB: 1951/04/10   Charles Fox is a 69 y.o. year old male who sees [name], MD for primary care. RNCM was consulted by LCSW [name] to assistance patient with  Disease Management and Care Coordination for his diabetes.  The patient came into the office stating that he needed insulin and could not afford it..     Intervention:  Review of patient status, including review of consultants reports, relevant laboratory and other test results, and collaboration with appropriate care team members and the patient's provider was performed as part of comprehensive patient evaluation and provision of chronic care management services.    RNCM called and left a message for the patient and he returned the call.  He states that he has enough insulin to last him to next week.  He agreed to come into the office for a check of his levels and medication.  His appointment is on 09/10/20 at 910 am.  He was told to bring all of his medications and he verbalized understanding.  He also states that he needs to move from his home because he can't continue to pay his rent where he lives.  I advised him that I will inform our Social worker and Pharmacist regarding his needs.    Plan:  RNCM sent staff message to [name] LCSW and Verita Schneiders D regarding Housing and insulin samples.  RNCM will schedule follow up appointment for the patient.   [name] RN, BSN, [facility] Care Management Coordinator [place] Phone: [phone] I Fax: [phone]

## 2020-09-04 ENCOUNTER — Telehealth: Payer: Medicare HMO

## 2020-09-04 NOTE — Chronic Care Management (AMB) (Signed)
    Clinical Social Work  Care Management Outreach   09/04/2020 Name: [name] MRN: [id number] DOB: 07/20/1951  Charles Fox is a 69 y.o. year old male who is a primary care patient of [name], MD .   F/U phone call to [name] today to assess needs, and progress with care plan goals.  The outreach was unsuccessful. A HIPPA compliant phone message was left for the patient providing contact information and requesting a return call.   Plan: LCSW will wait for return call and continue to collaborate with CCM RN and Pharmacy for ongoing needs  Review of patient status, including review of consultants reports, relevant laboratory and other test results, and collaboration with appropriate care team members and the patient's provider was performed as part of comprehensive patient evaluation and provision of care management services.    [name], [place] / [place]   [phone] 8:13 AM

## 2020-09-07 ENCOUNTER — Telehealth: Payer: Self-pay | Admitting: Pharmacist

## 2020-09-07 NOTE — Telephone Encounter (Signed)
-----   Message from Leavy Cella, [place] sent at 09/02/2020  3:38 PM EST ----- Regarding: FW: Medicatrion and Housing  ----- Message ----- From: [name], RN Sent: 09/02/2020   3:32 PM EST To: Leavy Cella, RPH-CPP, Maurine Cane, LCSW Subject: Medicatrion and Housing                        Hi, Charles Fox called me today and states that he has insulin to last him till next week but would like samples because he does not have money to pay for his [place].  He is still talking about trying to obtain other housing.  He can't afford where he is staying.  He said he is paying a 1000.00 dollars in rent.  His son is still living with him and he is ready for him to go.    I have talked him in to an appointment next Thursday 23rd at 910 am. I told him to bring all of his medications.  He is going through some of the same issues as before.  He has a new number in the system [phone].  He said his other phone is broke.  Thanks   [employer]

## 2020-09-07 NOTE — Telephone Encounter (Signed)
I attempted to contact the patient on 09/04/20 and then again today, 09/07/20 regarding supplying a sample of insulin degludec [name]). I left two voicemails and have not yet received a call back from patient. I do not see an appointment listed for 09/10/20 at 9:10 AM so I am not sure if patient is planning to come to clinic that day.

## 2020-09-08 ENCOUNTER — Telehealth: Payer: Self-pay

## 2020-09-08 NOTE — Telephone Encounter (Addendum)
  Chronic Care Management   Note  09/08/2020 Name: Charles Fox MRN: [id number] DOB: 08-14-1951  RNCM called to remind the patient that he has an appointment scheduled in the office with his PCP on 12/23 at 850 am.  Told to come a few minutes early for check in. Left contact information if he has any questions.  Addendum:  Patient returned call at 10:38 am and he was reminded to bring his medications to his appointment and to arrive early to be checked in.  He states that he will be at his appointment.   Follow up plan: RNCM will follow up with the patient at next scheduled interval.   [name] RN, BSN, [place] Phone: [phone] I Fax: [phone]

## 2020-09-09 NOTE — Progress Notes (Signed)
    SUBJECTIVE:   CHIEF COMPLAINT / HPI:   Diabetes: Patient has been taking his [place] as well as his Metformin.  He takes the Metformin once a day in the morning.  He takes the [place] in the afternoon around 5 PM.  He does not check his blood sugar regularly.  He states one of the medications is giving him diarrhea but he does not think it is the Metformin.  He thinks it may be the atorvastatin.  Patient does his best to avoid eating sweets such as cookies and will exercise regularly by walking around the neighborhood.  Patient is taking his blood pressure medication and his statin daily.  He does not endorse any side effects other than the diarrhea mentioned above.  Patient lives with his son.  Talks about being out of money regularly.  Social Security and retirement.  He has food stamps, but states they recently decreased the amount he receives drastically.  He recently got a new truck as his old car broke down completely.  Patient spoke with [name] our social worker and she is working with him on financial resources.   PERTINENT  PMH / PSH: DM, HTN  OBJECTIVE:   BP 131/85   Pulse 87   Ht 5\' 9"  (1.753 m)   Wt 254 lb 3.2 oz (115.3 kg)   SpO2 97%   BMI 37.54 kg/m   General: Alert and oriented.  No acute distress CV: Regular rate and rhythm, no murmurs Lungs: LCTAB  ASSESSMENT/PLAN:   Type 2 diabetes mellitus with neurological complications ([place]) See today is 7.6, much better than has been previously.  Advised patient to continue his current regiment of Metformin once in the morning and Tresiba in the afternoon.  Discussed avoiding high carbohydrate foods.  We will follow up in 3 months.  Food insecurity Patient states that his food stamp allowance decreased and sometimes he has trouble making his monthly income last the entire month.  In addition to sometimes stating he is able to afford foods, patient also has difficulty getting enough money for gas.  Patient recently  got a new truck after his previous car died on him.  Patient currently lives with his son.  Social worker, [name], spoke with patient and will continue to work with him on financial resources including housing, food, medications.  Patient has been resistant to these efforts in the past including not answering phone calls and not letting the home health employees into his house to work with him.     [name], MD [place]

## 2020-09-10 ENCOUNTER — Other Ambulatory Visit: Payer: Self-pay

## 2020-09-10 ENCOUNTER — Ambulatory Visit: Payer: Medicare HMO | Admitting: Licensed Clinical Social Worker

## 2020-09-10 ENCOUNTER — Ambulatory Visit (INDEPENDENT_AMBULATORY_CARE_PROVIDER_SITE_OTHER): Payer: Medicare HMO | Admitting: Family Medicine

## 2020-09-10 VITALS — BP 131/85 | HR 87 | Ht 69.0 in | Wt 254.2 lb

## 2020-09-10 DIAGNOSIS — E1149 Type 2 diabetes mellitus with other diabetic neurological complication: Secondary | ICD-10-CM

## 2020-09-10 DIAGNOSIS — Z5941 Food insecurity: Secondary | ICD-10-CM | POA: Diagnosis not present

## 2020-09-10 LAB — POCT GLYCOSYLATED HEMOGLOBIN (HGB A1C): Hemoglobin A1C: 7.6 % — AB (ref 4.0–5.6)

## 2020-09-10 NOTE — Patient Instructions (Addendum)
  Mr. Mecca  it was nice speaking with you. Please call me directly [phone] if you have questions about the goals we discussed. Goals Addressed            This Visit's Progress   . I need new housing   Not on track    Patient Goals/Self-Care Activities: Over the next 30 days . Call [employer] to discuss next steps with housing options [address] [place], [place] [address] [phone]  . I have placed a referral to [employer]: call them if no one has called you in a week [place], [place], [place] [address]  [phone]  . Affordable Housing Management:  is another option for housing [address] [place] [address] [place], [place] [address] [phone]     . Also call DSS to renew your food stamps [facility] Department for Social Services: [phone]    SNAP/ Food benefits and Medicaid:  [place]       Mr. Corporan received Care Management services today:  1. Care Management services include personalized support from designated clinical staff supervised by his physician, including individualized plan of care and coordination with other care providers 2. 24/7 contact [phone] for assistance for urgent and routine care needs. 3. Care Management are voluntary services and be declined at any time by calling the office.  Print copy of patient instructions provided.    Follow up plan: SW will follow up with patient by phone over the next 30 to 45 days  Maurine Cane, LCSW

## 2020-09-10 NOTE — Patient Instructions (Signed)
It was nice to see you again today,  Your blood pressure is under control, your A1c for your diabetes is the best has been in over a year, and your cholesterol was normal the last time we checked it.  I do not think we need to change any of your medications at this time.  Continue taking them as you are taking them.  I like to see you back in 3 months.  Have a great day,  Clemetine Marker, MD

## 2020-09-10 NOTE — Chronic Care Management (AMB) (Signed)
Care Management   Clinical Social Work Follow UpNote  09/10/2020 Name: Charles Fox MRN: [id number] DOB: Oct 15, 1950 Charles Fox is a 69 y.o. year old male who sees [name], MD for primary care.  Patient is enrolled in a Managed Medicaid plan: No.  LCSW engaged with [name] today face to face in response to provider referral for social work case management and/or care coordination services. See care plan below for details during this encounter.  Follow Up Plan: LCSW will continue to collaborate with CCM RN and PCP for ongoing needs. (30 check in)  Advanced Directives Status:Not addressed in this encounter.     SDOH (Social Determinants of Health) assessments performed: Yes  SDOH Interventions   Flowsheet Row Most Recent Value  SDOH Interventions   Housing Interventions [id number] Referral       Patient Care Plan: obtain new housing  Problem Identified: unable to afford current housing   Priority: Medium  Long-Range Goal: Comfort Maintained with affordable housing   Start Date: 07/15/2020  Expected End Date: 12/17/2020  This Visit's Progress: Not on track  Recent Progress: On track  Priority: Medium  Assessment, progress and Current Barriers: Patient reports he has picked up his birth certificate.  He wants new housing and is tired of his son living with him.   . Difficult with navigating systems to complete process to get housing unit.   . Can't remember what [place] told him to do in order to the unit at [employer]) : assist with navigating housing options  Interventions provided by LCSW:  . Assessment of needs, barriers and progress   . Left voice message and e-mail sent to Shanon Ace [phone] intake person at [facility]   . Discussed next steps with completing process with housing authority since patient now has his birth certificate  . Teach back of next steps . Permission received from patient to  place [facility] Referral [facility] [employer] and [employer] case management services for housing and support with completing applications) . Solution-Focused Strategies;Problem Solving    . collaboration with [name], MD regarding development and update of comprehensive plan of care as evidenced by provider attestation and co-signature . Inter-disciplinary care team collaboration (see longitudinal plan of care) Patient Goals/Self-Care Activities: Over the next 30 days . Call [employer] to discuss next steps with housing options [address] [place], [place] [address] [phone]  . I have placed a referral to [employer]: call them if no one has called you in a week [place], [place], [place] [address]  [phone]  . Affordable Housing Management:  is another option for housing [address], [place] [address] [place], [place] [address] [phone]     Follow Up Plan: LCSW will continue to collaborate with CCM RN and PCP for ongoing needs   Problem Identified: Barriers to Treatment with food     Goal: Over the next 10 days, patient will  contact DSS to find out why his stamps stopped and what he needs to do to get them started again   Start Date: 09/10/2020  Expected End Date: 10/19/2020  This Visit's Progress: On track  Priority: High  Assessment, progress & current barriers:  Patient reports his food stamps have stopped and he doesn't know why.  Requested food and money for gas to get home. His son and grandchildren continue to live with patient. [name] acknowledges deficits with meeting this  unmet need  Clinical Interventions: collaboration with [name], MD regarding development and update of comprehensive plan of care as evidenced by provider attestation and co-signature . Inter-disciplinary care team collaboration (see longitudinal plan of care) . Assessment of needs, barriers , agencies contacted, as well  as how impacting  . Review various resources and discussed options   . Provided [facility] [employer] and $5.00 for gas to get home . Provided patient with information about how to contact DSS Patient Goals/Self-Care Activities: Over the next 10 days . call DSS to renew your food stamps [facility] Department for Social Services: [phone]    SNAP/ Food benefits and Medicaid:  [address], [place]       Outpatient Encounter Medications as of 09/10/2020  Medication Sig Note  . Accu-Chek Softclix Lancets lancets Use as instructed   . Alcohol Swabs PADS He is to check blood glucose 2-3 times a day   . aspirin 81 MG tablet Take 1 tablet (81 mg total) by mouth daily. 06/11/2020: Taking two pills  . atorvastatin (LIPITOR) 40 MG tablet Take 1 tablet (40 mg total) by mouth daily. 06/11/2020: Out of meds  . blood glucose meter kit and supplies KIT Dispense based on patient and insurance preference. Use up to four times daily as directed. (FOR ICD-9 250.00, 250.01).   . Blood Glucose Monitoring Suppl (ACCU-CHEK AVIVA PLUS) w/Device KIT Check blood glucose before in AM while fasting.   . dorzolamide-timolol (COSOPT) 22.3-6.8 MG/ML ophthalmic solution Place 1 drop into both eyes 4 (four) times daily.   [name] glucose blood (ACCU-CHEK AVIVA PLUS) test strip Use as instructed   . insulin degludec (TRESIBA FLEXTOUCH) 100 UNIT/ML FlexTouch Pen Inject 30 Units into the skin daily before breakfast.   . Insulin Glargine (BASAGLAR KWIKPEN) 100 UNIT/ML    . Insulin Syringe-Needle U-100 (INSULIN SYRINGE .5CC/30GX1/2") 30G X 1/2" 0.5 ML MISC Use with glargine. Dispose after each use.   . losartan (COZAAR) 50 MG tablet Take 1 tablet (50 mg total) by mouth at bedtime. (Patient not taking: Reported on 06/11/2020)   . metFORMIN (GLUCOPHAGE-XR) 500 MG 24 hr tablet Take 1 tablet (500 mg total) by mouth 2 (two) times daily with a meal.    No facility-administered encounter medications on file as of 09/10/2020.   Review of  patient status, including review of consultants reports, relevant laboratory and other test results, and collaboration with appropriate care team members and the patient's provider was performed as part of comprehensive patient evaluation and provision of chronic care management services.       Information about Care Management services was shared with Mr.  Danford today including:  1. Care Management services include personalized support from designated clinical staff supervised by his physician, including individualized plan of care and coordination with other care providers 2. Remind patient of 24/7 contact phone numbers to provider's office for assistance with urgent and routine care needs. 3. Care Management services are voluntary and patient may stop at any time .   Patient agreed to services provided today and verbal consent obtained.       [name], [place] / [place]   [phone] 10:19 AM

## 2020-09-10 NOTE — Assessment & Plan Note (Signed)
Patient states that his food stamp allowance decreased and sometimes he has trouble making his monthly income last the entire month.  In addition to sometimes stating he is able to afford foods, patient also has difficulty getting enough money for gas.  Patient recently got a new truck after his previous car died on him.  Patient currently lives with his son.  Social worker, [name], spoke with patient and will continue to work with him on financial resources including housing, food, medications.  Patient has been resistant to these efforts in the past including not answering phone calls and not letting the home health employees into his house to work with him.

## 2020-09-10 NOTE — Assessment & Plan Note (Signed)
See today is 7.6, much better than has been previously.  Advised patient to continue his current regiment of Metformin once in the morning and Tresiba in the afternoon.  Discussed avoiding high carbohydrate foods.  We will follow up in 3 months.

## 2020-09-14 ENCOUNTER — Telehealth: Payer: Medicare HMO

## 2020-09-15 ENCOUNTER — Telehealth: Payer: Self-pay | Admitting: *Deleted

## 2020-09-15 ENCOUNTER — Telehealth: Payer: Self-pay

## 2020-09-15 NOTE — Telephone Encounter (Signed)
°  Care Management   Outreach Note  09/15/2020 Name: Charles Fox MRN: [id number] DOB: 1951-04-18  Referred by: Sandre Kitty, MD Reason for referral : Chronic Care Management (DM II)   DARRON STUCK is enrolled in a Managed Medicaid Health Plan: No  An unsuccessful telephone outreach was attempted today. The patient was referred to the case management team for assistance with care management and care coordination.   Follow Up Plan: A HIPAA compliant phone message was left for the patient providing contact information and requesting a return call.  The care management team will reach out to the patient again over the next 7-14 days.   Juanell Fairly RN, BSN, [facility] Care Management Coordinator [facility] Phone: [phone] I Fax: [phone]

## 2020-09-15 NOTE — Chronic Care Management (AMB) (Signed)
  Care Management   Note  09/15/2020 Name: TRACIE LINDBLOOM MRN: [id number] DOB: Oct 29, 1950  GABRIEL CONRY is a 69 y.o. year old male who is a primary care patient of Sandre Kitty, MD and is actively engaged with the care management team. I reached out to [name] by phone today to assist with re-scheduling a follow up visit with the RN Case Manager  Follow up plan: Unsuccessful telephone outreach attempt made. A HIPAA compliant phone message was left for the patient providing contact information and requesting a return call.  The care management team will reach out to the patient again over the next 7 days.  If patient returns call to provider office, please advise to call Embedded Care Management Care Guide Gwenevere Ghazi at [phone].  Gwenevere Ghazi  Care Guide, Embedded Care Coordination [facility] Management  Direct Dial: [phone]

## 2020-09-21 NOTE — Chronic Care Management (AMB) (Signed)
  Care Management   Note  09/21/2020 Name: [name] MRN: [id number] DOB: 18-Mar-1951  Charles Fox is a 70 y.o. year old male who is a primary care patient of Sandre Kitty, MD and is actively engaged with the care management team. I reached out to [name] by phone today to assist with re-scheduling a follow up visit with the RN Case Manager  Follow up plan: A second unsuccessful telephone outreach attempt made. A HIPAA compliant phone message was left for the patient providing contact information and requesting a return call. The care management team will reach out to the patient again over the next 7 days. If patient returns call to provider office, please advise to call Embedded Care Management Care Guide Gwenevere Ghazi at [phone].  Gwenevere Ghazi  Care Guide, Embedded Care Coordination [facility] Management  Direct Dial: [phone]

## 2020-09-23 MED FILL — ATORVASTATIN 40 MG TABLET: 40 | 30 days supply | Qty: 30 | Fill #5

## 2020-09-25 MED FILL — TRESIBA FLEXTOUCH 100 UNITS: 100 | 30 days supply | Qty: 9 | Fill #0

## 2020-09-28 NOTE — Telephone Encounter (Signed)
Patient called Charles Fox back and rescheduled for 10/01/2020. Called patient and reminded of up coming call with [facility]

## 2020-09-28 NOTE — Chronic Care Management (AMB) (Signed)
  Care Management   Note  09/28/2020 Name: Charles Fox MRN: [id number] DOB: 1950/11/23  Charles Fox is a 70 y.o. year old male who is a primary care patient of [name], MD and is actively engaged with the care management team. I reached out to [name] by phone today to assist with re-scheduling a follow up visit with the RN Case Manager  Follow up plan: Telephone appointment with care management team member scheduled for:10/01/2020  [place] Management

## 2020-09-29 ENCOUNTER — Ambulatory Visit: Payer: Self-pay | Admitting: Licensed Clinical Social Worker

## 2020-09-29 NOTE — Chronic Care Management (AMB) (Signed)
Clinical Social Work  Care Management  Unsuccessful Phone Outreach    09/29/2020 Name: Charles Fox MRN: [id number] DOB: [birth year]  Charles Fox is a 70 y.o. year old male who is a primary care patient of [name], MD .   F/U phone call to [name] today to assess needs, and progress with care plan goals.  A HIPPA compliant phone message was left for the patient providing contact information and requesting a return call.   Intervention: Unable to reach patient to discuss care plan goals.  See updates in [place] from with [facility] referrals   Plan: No follow ups scheduled, LCSW will continue to collaborate with PCP and CCM RN for ongoing needs  CARE PLAN  Patient Care Plan: obtain new housing  Problem Identified: unable to afford current housing   Priority: Medium  Long-Range Goal: Comfort Maintained with affordable housing   Start Date: 07/15/2020  Expected End Date: 12/17/2020  Recent Progress: Not on track  Priority: Medium  Assessment, progress and Current Barriers: Based on Notes in [employer] has contacted patient and provided him a list of housing options.  [employer] Case management has  notes in [place] that they contact patient and assisted him with online applications.  . Patient reports he has picked up his birth certificate.  He wants new housing and is tired of his son living with him.   . Difficult with navigating systems to complete process to get housing unit.   . Can't remember what [place] told him to do in order to the unit at [employer]) : assist with navigating housing options  Interventions provided by LCSW:  . Assessment of needs, barriers and progress   . Left voice message and e-mail sent to Shanon Ace [phone] intake person at [facility]   . Discussed next steps with completing process with housing authority since patient now has his birth certificate   . Teach back of next steps . Permission received from patient to place [facility] Referral [facility] [employer] and [employer] case management services for housing and support with completing applications) . Solution-Focused Strategies;Problem Solving    . collaboration with [name], MD regarding development and update of comprehensive plan of care as evidenced by provider attestation and co-signature . Inter-disciplinary care team collaboration (see longitudinal plan of care) Patient Goals/Self-Care Activities: Over the next 30 days . Call [employer] to discuss next steps with housing options [address] [place], [place] [address] [phone] . I have placed a referral to [employer]: call them if no one has called you in a week [place], [place], [place] [address]  [phone]  . Affordable Housing Management:  is another option for housing [address] [place] [address] [place], [place] [address] [phone]     Follow Up Plan: LCSW will continue to collaborate with CCM RN and PCP for ongoing needs   Problem Identified: Barriers to Treatment with food   Goal: Over the next 10 days, patient will  contact DSS to find out why his stamps stopped and what he needs to do to get them started again   Start Date: 09/10/2020  Expected End Date: 10/19/2020  Recent Progress: On track  Priority: High  Assessment, progress & current barriers:  Referral placed to [facility] they have contacted patient and he is on the meals on wheels waitlist . Patient reports his food  stamps have stopped and he doesn't know why.  Requested food and money for gas to get home. His son and grandchildren continue to live with patient. [name] acknowledges deficits with meeting this unmet need  Clinical Interventions: collaboration with [name], MD regarding development and update of comprehensive plan of care as evidenced by provider attestation  and co-signature . Inter-disciplinary care team collaboration (see longitudinal plan of care) . Assessment of needs, barriers , agencies contacted, as well as how impacting  . Review various resources and discussed options   . Provided [facility] [employer] and $5.00 for gas to get home . Provided patient with information about how to contact DSS Patient Goals/Self-Care Activities: Over the next 10 days . call DSS to renew your food stamps . [place] for Social Services: [phone]    . SNAP/ Food benefits and Medicaid:  [address] [place]    Review of patient status, including review of consultants reports, relevant laboratory and other test results, and collaboration with appropriate care team members and the patient's provider was performed as part of comprehensive patient evaluation and provision of care management services.    [name], [place] / [place]   [phone] 12:34 PM

## 2020-10-01 ENCOUNTER — Ambulatory Visit: Payer: Medicare Other

## 2020-10-01 NOTE — Chronic Care Management (AMB) (Signed)
Care Management    RN Visit Note  10/01/2020 Name: CAEDMON LOUQUE MRN: [id number] DOB: September 26, 1950  Subjective: Charles Fox is a 70 y.o. year old male who is a primary care patient of [name], MD. The care management team was consulted for assistance with disease management and care coordination needs.    Engaged with patient by telephone for follow up visit in response to provider referral for case management and/or care coordination services.   Consent to Services:   Mr. Leverich was given information about Care Management services today including:  1. Care Management services includes personalized support from designated clinical staff supervised by his physician, including individualized plan of care and coordination with other care providers 2. 24/7 contact phone numbers for assistance for urgent and routine care needs. 3. The patient may stop case management services at any time by phone call to the office staff.  Patient agreed to services and consent obtained.    Assessment: Patient is currently experiencing difficulty with checking his blood sugars because he isunable to fin his glucometer.  He contiues to want to find another place to live. See Care Plan below for interventions and patient self-care actives. : Review of patient past medical history, allergies, medications, health status, including review of consultants reports, laboratory and other test data, was performed as part of comprehensive evaluation and provision of chronic care management services.   SDOH (Social Determinants of Health) assessments and interventions performed:    Care Plan  No Known Allergies  Outpatient Encounter Medications as of 10/01/2020  Medication Sig Note  . Accu-Chek Softclix Lancets lancets Use as instructed   . Alcohol Swabs PADS He is to check blood glucose 2-3 times a day   . aspirin 81 MG tablet Take 1 tablet (81 mg total) by mouth daily. 06/11/2020: Taking two pills  .  atorvastatin (LIPITOR) 40 MG tablet Take 1 tablet (40 mg total) by mouth daily. 06/11/2020: Out of meds  . blood glucose meter kit and supplies KIT Dispense based on patient and insurance preference. Use up to four times daily as directed. (FOR ICD-9 250.00, 250.01).   . Blood Glucose Monitoring Suppl (ACCU-CHEK AVIVA PLUS) w/Device KIT Check blood glucose before in AM while fasting.   . dorzolamide-timolol (COSOPT) 22.3-6.8 MG/ML ophthalmic solution Place 1 drop into both eyes 4 (four) times daily.   [name] glucose blood (ACCU-CHEK AVIVA PLUS) test strip Use as instructed   . insulin degludec (TRESIBA FLEXTOUCH) 100 UNIT/ML FlexTouch Pen Inject 30 Units into the skin daily before breakfast.   . Insulin Glargine (BASAGLAR KWIKPEN) 100 UNIT/ML    . Insulin Syringe-Needle U-100 (INSULIN SYRINGE .5CC/30GX1/2") 30G X 1/2" 0.5 ML MISC Use with glargine. Dispose after each use.   . losartan (COZAAR) 50 MG tablet Take 1 tablet (50 mg total) by mouth at bedtime. (Patient not taking: Reported on 06/11/2020)   . metFORMIN (GLUCOPHAGE-XR) 500 MG 24 hr tablet Take 1 tablet (500 mg total) by mouth 2 (two) times daily with a meal.    No facility-administered encounter medications on file as of 10/01/2020.    Patient Active Problem List   Diagnosis Date Noted  . History of lacunar cerebrovascular accident 06/11/2020  . Dementia without behavioral disturbance ([place]) 05/27/2020  . Impaired vision 05/27/2020  . Pain, dental 11/20/2019  . High priority for COVID-19 virus vaccination 11/20/2019  . Chronic pain of both shoulders 09/10/2019  . Tendinopathy of left rotator cuff 04/29/2019  . Food insecurity 07/09/2018  .  Skin lesions, generalized 05/09/2018  . Hyperlipidemia associated with type 2 diabetes mellitus ([place]) 12/22/2016  . Urine test positive for microalbuminuria 05/27/2016  . Type 2 diabetes mellitus with neurological complications ([place]) 70/35/0093  . Morbid obesity ([place]) 09/04/2014  . Hypertension  associated with diabetes ([place]) 09/04/2014  . Tobacco use disorder 09/04/2014    Conditions to be addressed/monitored: DMII  Patient Care Plan: obtain new housing    Problem Identified: unable to afford current housing   Priority: Medium    Long-Range Goal: Comfort Maintained with affordable housing   Start Date: 07/15/2020  Expected End Date: 12/17/2020  Recent Progress: Not on track  Priority: Medium  Note:   Assessment, progress and Current Barriers: Based on Notes in [employer] has contacted patient and provided him a list of housing options.  [employer] Case management has had notes in [place] that they contact patient and assisted him with online applications.  . Patient reports he has picked up his birth certificate.  He wants new housing and is tired of his son living with him.   . Difficult with navigating systems to complete process to get housing unit.   . Can't remember what [place] told him to do in order to the unit at [employer]) : assist with navigating housing options  Interventions provided by LCSW:  . Assessment of needs, barriers and progress   . Left voice message and e-mail sent to Shanon Ace [phone] intake person at [facility]   . Discussed next steps with completing process with housing authority since patient now has his birth certificate  . Teach back of next steps . Permission received from patient to place [facility] Referral [facility] [employer] and [employer] case management services for housing and support with completing applications) . Solution-Focused Strategies;Problem Solving    . collaboration with [name], MD regarding development and update of comprehensive plan of care as evidenced by provider attestation and co-signature . Inter-disciplinary care team collaboration (see longitudinal plan of care) Patient Goals/Self-Care Activities: Over the  next 30 days . Call [employer] to discuss next steps with housing options [address] [place], [place] [address] [phone] . I have placed a referral to [employer]: call them if no one has called you in a week [place], [place], [place] [address]  [phone]  . Affordable Housing Management:  is another option for housing [address] [place] [address] [place], [place] [address] [phone]     Follow Up Plan: LCSW will continue to collaborate with CCM RN and PCP for ongoing needs   Problem Identified: Barriers to Treatment with food     Goal: Over the next 10 days, patient will  contact DSS to find out why his stamps stopped and what he needs to do to get them started again   Start Date: 09/10/2020  Expected End Date: 10/19/2020  Recent Progress: On track  Priority: High  Note:   Assessment, progress & current barriers:  Referral placed to [facility] they have contacted patient and he is on the meals on wheels waitlist . Patient reports his food stamps have stopped and he doesn't know why.  Requested food and money for gas to get home. His son and grandchildren continue to live with patient. [name] acknowledges deficits with meeting this unmet need  Clinical Interventions: collaboration with [name], MD regarding development and update of comprehensive plan of care as  evidenced by provider attestation and co-signature . Inter-disciplinary care team collaboration (see longitudinal plan of care) . Assessment of needs, barriers , agencies contacted, as well as how impacting  . Review various resources and discussed options   . Provided [facility] [employer] and $5.00 for gas to get home . Provided patient with information about how to contact DSS Patient Goals/Self-Care Activities: Over the next 10 days . call DSS to renew your food stamps . [place] for Social Services: [phone]    . SNAP/ Food benefits and  Medicaid:  [address] [place]    Patient Care Plan: RN Case Manager  Problem Identified: Glycemic Management (Diabetes, Type 2)   Goal: Glycemic Management Optimized   Start Date: 02/26/2020  Expected End Date: 01/16/2021  This Visit's Progress: Not on track  Priority: High  Objective:  Lab Results  Component Value Date   HGBA1C 7.6 (A) 09/10/2020 .   Lab Results  Component Value Date   CREATININE 0.74 (L) 05/26/2020   CREATININE 0.68 10/15/2019   CREATININE 0.82 04/11/2019    Current Barriers:  [name] Knowledge Deficits related to basic Diabetes pathophysiology and self care/management . Literacy barriers  Case Manager Clinical Goal(s):  Over the next 90 days, patient will demonstrate improved adherence to prescribed treatment plan for diabetes self care/management as evidenced by: daily monitoring and recording of CBG and adherence to prescribed medication regimen.  Interventions:  . Provided education to patient about basic DM disease process . Reviewed medications with patient and discussed importance of medication adherence . Discussed plans with patient for ongoing care management follow up and provided patient with direct contact information for care management team . Advised patient, providing education and rationale, to check cbg  and record, calling the office for findings outside established parameters.   . Review of patient status, including review of consultants reports, relevant laboratory and other test results, and medications completed. [name] activity based on tolerance and functional limitations encouraged . healthy lifestyle promoted  Anticipate A1C testing (point-of-care) every 3 to 6 months based on goal attainment.   Promote self-monitoring of blood glucose levels. The patient is not checking his blood sugars .  He states that his grandchildren have taken his glucometer and lost it.  Advised the patient that he can get a low cost glucometer at [place]  with low cost strips.  He states that he will look into getting one. . blood glucose readings reviewed . mutual A1C goal set or reviewed . Patient states that his son and his family have moved out of his home.  He is glad to be alone.  We discussed him eating regularly and taking his medications. The patient continues to state that he is looking for another place to live because it is hard on him to stay in the trailer.  I will route this note to [place].   Patient Goals/Self Care Activities:   Patient verbalizes understanding of plan Self-administers medications as prescribed  Calls pharmacy for medication refills  Call's provider office for new concerns or questions  check blood sugar at prescribed times  Getting the Relion glucometer at [facility] to check his blood ssugars.   Follow up Plan: Patient would like continued follow-up.  CCM RNCM will follow up with the pateit in the next 2 weeks to see if he haas been able to pick up the glucometer . Patient will call office if needed prior to next encounter        Lylian Sanagustin  Eloise Harman, BSN, [facility] Care Management Coordinator [place] Phone: [phone] I Fax: [phone]

## 2020-10-01 NOTE — Patient Instructions (Signed)
Mr. Gago  it was nice speaking with you. Please call me directly [phone] if you have questions about the goals we discussed.  Goals Addressed              This Visit's Progress   .  Eat Healthy        Timeframe:  Long-Range Goal Priority:  High Start Date:     10/01/20                        Expected End Date:     01/16/21                  Follow Up Date 10/09/20   - drink 6 to 8 glasses of water each day - limit fast food meals to no more than 1 per week - manage portion size    Why is this important?    When you are ready to manage your nutrition or weight, having a plan and setting goals will help.   Taking small steps to change how you eat and exercise is a good place to start.    Notes:     .  Find Help in My Community        Timeframe:  Short-Term Goal Priority:  High Start Date:       10/01/20                      Expected End Date:     12/19/13/22                  Follow Up Date 10/09/20   - follow-up on any referrals for help I am given - think ahead to make sure my need does not become an emergency - have a back-up plan - make a list of family or friends that I can call    Why is this important?    Knowing how and where to find help for yourself or family in your neighborhood and community is an important skill.   You will want to take some steps to learn how.    Notes:                  [name]  Monitor and Manage My Blood Sugar-Diabetes Type 2        Timeframe:  Long-Range Goal Priority:  High Start Date:         10/01/20                    Expected End Date:  14/30/22                     Follow Up Date 10/09/20   - check blood sugar at prescribed times - take the blood sugar meter to all doctor visits    Why is this important?    Checking your blood sugar at home helps to keep it from getting very high or very low.   Writing the results in a diary or log helps the doctor know how to care for you.   Your blood sugar log should have the  time, date and the results.   Also, write down the amount of insulin or other medicine that you take.   Other information, like what you ate, exercise done and how you were feeling, will also be helpful.     Notes:          Patient  Care Plan: RN Case Manager  Problem Identified: Glycemic Management (Diabetes, Type 2)   Goal: Glycemic Management Optimized   Start Date: 02/26/2020  Expected End Date: 01/16/2021  This Visit's Progress: Not on track  Priority: High  Objective:  Lab Results  Component Value Date   HGBA1C 7.6 (A) 09/10/2020 .   Lab Results  Component Value Date   CREATININE 0.74 (L) 05/26/2020   CREATININE 0.68 10/15/2019   CREATININE 0.82 04/11/2019    Current Barriers:  [name] Knowledge Deficits related to basic Diabetes pathophysiology and self care/management . Literacy barriers  Case Manager Clinical Goal(s):  Over the next 90 days, patient will demonstrate improved adherence to prescribed treatment plan for diabetes self care/management as evidenced by: daily monitoring and recording of CBG and adherence to prescribed medication regimen.  Interventions:  . Provided education to patient about basic DM disease process . Reviewed medications with patient and discussed importance of medication adherence . Discussed plans with patient for ongoing care management follow up and provided patient with direct contact information for care management team . Advised patient, providing education and rationale, to check cbg  and record, calling the office for findings outside established parameters.   . Review of patient status, including review of consultants reports, relevant laboratory and other test results, and medications completed. [name] activity based on tolerance and functional limitations encouraged . healthy lifestyle promoted  Anticipate A1C testing (point-of-care) every 3 to 6 months based on goal attainment.   Promote self-monitoring of blood glucose levels. The  patient is not checking his blood sugars .  He states that his grandchildren have taken his glucometer and lost it.  Advised the patient that he can get a low cost glucometer at [place] with low cost strips.  He states that he will look into getting one. . blood glucose readings reviewed . mutual A1C goal set or reviewed . Patient states that his son and his family have moved out of his home.  He is glad to be alone.  We discussed him eating regularly and taking his medications. The patient continues to state that he is looking for another place to live because it is hard on him to stay in the trailer.  I will route this note to [place].   Patient Goals/Self Care Activities:   Patient verbalizes understanding of plan Self-administers medications as prescribed  Calls pharmacy for medication refills  Call's provider office for new concerns or questions  check blood sugar at prescribed times  Getting the Relion glucometer at [facility] to check his blood ssugars.   Follow up Plan: Patient would like continued follow-up.  CCM RNCM will follow up with the pateit in the next 2 weeks to see if he haas been able to pick up the glucometer . Patient will call office if needed prior to next encounter        Mr. Lipkin received Care Management services today:  1. Care Management services include personalized support from designated clinical staff supervised by his physician, including individualized plan of care and coordination with other care providers 2. 24/7 contact [phone] for assistance for urgent and routine care needs. 3. Care Management are voluntary services and be declined at any time by calling the office.  The patient verbalized understanding of instructions provided today and declined a print copy of patient instruction materials.     [name], RN

## 2020-10-09 ENCOUNTER — Ambulatory Visit: Payer: Medicare Other

## 2020-10-09 ENCOUNTER — Telehealth: Payer: Self-pay | Admitting: Licensed Clinical Social Worker

## 2020-10-09 NOTE — Patient Instructions (Signed)
Visit Information  Goals Addressed            This Visit's Progress    Eat Healthy       Timeframe:  Long-Range Goal Priority:  High Start Date:     10/01/20                        Expected End Date:     01/16/21                  Follow Up Date 10/20/20   - drink 6 to 8 glasses of water each day - limit fast food meals to no more than 1 per week - manage portion size    Why is this important?    When you are ready to manage your nutrition or weight, having a plan and setting goals will help.   Taking small steps to change how you eat and exercise is a good place to start.    Notes:      Find Help in My Community       Timeframe:  Short-Term Goal Priority:  High Start Date:       10/01/20                      Expected End Date:     12/19/13/22                  Follow Up Date 10/20/20   - follow-up on any referrals for help I am given - think ahead to make sure my need does not become an emergency - have a back-up plan - make a list of family or friends that I can call  -Use resource information given in the office for food    Why is this important?    Knowing how and where to find help for yourself or family in your neighborhood and community is an important skill.   You will want to take some steps to learn how.    Notes:      Monitor and Manage My Blood Sugar-Diabetes Type 2   Not on track    Timeframe:  Long-Range Goal Priority:  High Start Date:         10/01/20                    Expected End Date:  01/16/21                    02/1/221/21/22   - check blood sugar at prescribed times - take the blood sugar meter to all doctor visits    Why is this important?    Checking your blood sugar at home helps to keep it from getting very high or very low.   Writing the results in a diary or log helps the doctor know how to care for you.   Your blood sugar log should have the time, date and the results.   Also, write down the amount of insulin or other  medicine that you take.   Other information, like what you ate, exercise done and how you were feeling, will also be helpful.     Notes:        The patient verbalized understanding of instructions, educational materials, and care plan provided today and declined offer to receive copy of patient instructions, educational materials, and care plan.   Follow up Plan: Patient would like continued follow-up.  CCM RNCM  will outreach the patient within the next 14 days.. Patient will call office if needed prior to next encounter  [name] RN, BSN, [facility] Care Management Coordinator [place] Phone: [phone] I Fax: [phone]   .

## 2020-10-09 NOTE — Chronic Care Management (AMB) (Signed)
Care Management    RN Visit Note  10/09/2020 Name: Charles Fox MRN: [id number] DOB: 1951/07/29  Subjective: Charles Fox is a 70 y.o. year old male who is a primary care patient of [name], MD. The care management team was consulted for assistance with disease management and care coordination needs.    Engaged with patient by telephone for follow up visit in response to provider referral for case management and/or care coordination services.   Consent to Services:   Charles Fox was given information about Care Management services today including:  1. Care Management services includes personalized support from designated clinical staff supervised by his physician, including individualized plan of care and coordination with other care providers 2. 24/7 contact phone numbers for assistance for urgent and routine care needs. 3. The patient may stop case management services at any time by phone call to the office staff.  Patient agreed to services and consent obtained.    Assessment: Patient continues to experience difficulty with checking his blood sugars on a regular basis... See Care Plan below for interventions and patient self-care actives. Follow up Plan: Patient would like continued follow-up.  CCM RNCM will outreach the patient in the next 14 days. Patient will call office if needed prior to next encounter  Review of patient past medical history, allergies, medications, health status, including review of consultants reports, laboratory and other test data, was performed as part of comprehensive evaluation and provision of chronic care management services.   SDOH (Social Determinants of Health) assessments and interventions performed:    Care Plan  No Known Allergies  Outpatient Encounter Medications as of 10/09/2020  Medication Sig Note  . Accu-Chek Softclix Lancets lancets Use as instructed   . Alcohol Swabs PADS He is to check blood glucose 2-3 times a day   .  aspirin 81 MG tablet Take 1 tablet (81 mg total) by mouth daily. 06/11/2020: Taking two pills  . atorvastatin (LIPITOR) 40 MG tablet Take 1 tablet (40 mg total) by mouth daily. 06/11/2020: Out of meds  . blood glucose meter kit and supplies KIT Dispense based on patient and insurance preference. Use up to four times daily as directed. (FOR ICD-9 250.00, 250.01).   . Blood Glucose Monitoring Suppl (ACCU-CHEK AVIVA PLUS) w/Device KIT Check blood glucose before in AM while fasting.   . dorzolamide-timolol (COSOPT) 22.3-6.8 MG/ML ophthalmic solution Place 1 drop into both eyes 4 (four) times daily.   [name] glucose blood (ACCU-CHEK AVIVA PLUS) test strip Use as instructed   . insulin degludec (TRESIBA FLEXTOUCH) 100 UNIT/ML FlexTouch Pen Inject 30 Units into the skin daily before breakfast.   . Insulin Glargine (BASAGLAR KWIKPEN) 100 UNIT/ML    . Insulin Syringe-Needle U-100 (INSULIN SYRINGE .5CC/30GX1/2") 30G X 1/2" 0.5 ML MISC Use with glargine. Dispose after each use.   . losartan (COZAAR) 50 MG tablet Take 1 tablet (50 mg total) by mouth at bedtime. (Patient not taking: Reported on 06/11/2020)   . metFORMIN (GLUCOPHAGE-XR) 500 MG 24 hr tablet Take 1 tablet (500 mg total) by mouth 2 (two) times daily with a meal.    No facility-administered encounter medications on file as of 10/09/2020.    Patient Active Problem List   Diagnosis Date Noted  . History of lacunar cerebrovascular accident 06/11/2020  . Dementia without behavioral disturbance ([place]) 05/27/2020  . Impaired vision 05/27/2020  . Pain, dental 11/20/2019  . High priority for COVID-19 virus vaccination 11/20/2019  . Chronic pain of  both shoulders 09/10/2019  . Tendinopathy of left rotator cuff 04/29/2019  . Food insecurity 07/09/2018  . Skin lesions, generalized 05/09/2018  . Hyperlipidemia associated with type 2 diabetes mellitus ([place]) 12/22/2016  . Urine test positive for microalbuminuria 05/27/2016  . Type 2 diabetes mellitus with  neurological complications ([place]) 26/20/3559  . Morbid obesity ([place]) 09/04/2014  . Hypertension associated with diabetes ([place]) 09/04/2014  . Tobacco use disorder 09/04/2014    Conditions to be addressed/monitored: DMII  Care Plan : RN Case Manager  Updates made by [name], RN since 10/09/2020 12:00 AM  Problem: Glycemic Management (Diabetes, Type 2)   Goal: Glycemic Management Optimized   Start Date: 02/26/2020  Expected End Date: 01/16/2021  Recent Progress: Not on track  Priority: High  Objective:  Lab Results  Component Value Date   HGBA1C 7.6 (A) 09/10/2020 .   Lab Results  Component Value Date   CREATININE 0.74 (L) 05/26/2020   CREATININE 0.68 10/15/2019   CREATININE 0.82 04/11/2019    Current Barriers:  [name] Knowledge Deficits related to basic Diabetes pathophysiology and self care/management . Literacy barriers  Case Manager Clinical Goal(s):  Over the next 90 days, patient will demonstrate improved adherence to prescribed treatment plan for diabetes self care/management as evidenced by: daily monitoring and recording of CBG and adherence to prescribed medication regimen.  Interventions:  . Provided education to patient about basic DM disease process . Reviewed medications with patient and discussed importance of medication adherence- He asked about his medication and RNCM told the patient to call his pharmacy for his refills. . Discussed plans with patient for ongoing care management follow up and provided patient with direct contact information for care management team . Advised patient, providing education and rationale, to check cbg  and record, calling the office for findings outside established parameters.   . Review of patient status, including review of consultants reports, relevant laboratory and other test results, and medications completed. [name] activity based on tolerance and functional limitations encouraged- patient states that he has been able to walk  daily . healthy lifestyle promoted  Anticipate A1C testing (point-of-care) every 3 to 6 months based on goal attainment.   Promote self-monitoring of blood glucose levels. The patient is not checking his blood sugars.  Discussed with the patient about the importance of checking his blood sugars.   He states that he understands but feels that his fingers are to tough. He states that he is taking his medications.  He states that his glucometer is broken.  He can get a glucometer at [employer] called [employer] which is a more affordable choice. He is still complaining about not be able to pay for his housing.   He has an appointment to view an apartment on [address] that he thinks may work out for him.  blood glucose readings reviewed . mutual A1C goal set or reviewed  Patient Goals/Self Care Activities:   Patient verbalizes understanding of plan Self-administers medications as prescribed  Calls pharmacy for medication refills  Call's provider office for new concerns or questions  check blood sugar at prescribed times  Getting the [employer]ion glucometer at [facility] to check his blood ssugars.         [name] RN, BSN, [facility] Care Management Coordinator [place] Phone: [phone] Fax: [phone]

## 2020-10-09 NOTE — Chronic Care Management (AMB) (Signed)
    Clinical Social Work  Care Management  Unsuccessful Phone Outreach    10/09/2020 Name: Charles Fox MRN: [id number] DOB: September 20, 1950  Charles Fox is a 70 y.o. year old male who is a primary care patient of [name], MD .   F/U phone call today to assess needs, and progress with care plan goals.  Telephone outreach was unsuccessful A HIPPA compliant phone message was left for the patient providing contact information and requesting a return call.   Plan:LCSW will wait for return call.  LCSW will continue to collaborate with CCM RN for patient needs  Review of patient status, including review of consultants reports, relevant laboratory and other test results, and collaboration with appropriate care team members and the patient's provider was performed as part of comprehensive patient evaluation and provision of care management services.     [name], [place] / [place]   [phone] 2:48 PM

## 2020-10-13 ENCOUNTER — Ambulatory Visit: Payer: Medicare HMO | Admitting: Licensed Clinical Social Worker

## 2020-10-13 DIAGNOSIS — Z789 Other specified health status: Secondary | ICD-10-CM

## 2020-10-13 NOTE — Patient Instructions (Signed)
Visit Information  Goals Addressed            This Visit's Progress   . I need new housing       Patient Goals/Self-Care Activities:   Continue to work with [facility]  [address] [place], [place] [address] [phone]  Continue to work with [employer]:  [address], [place], [place] [address]  [phone]  . Affordable Housing Management:  is another option for housing [address] [place], [place] [address] [phone]       . COMPLETED: reactivate food stamps       Patient Goals/Self-Care Activities: Over the next 10 days . call DSS to renew your food stamps [facility] Department for Social Services: [phone]    SNAP/ Food benefits and Medicaid:  [address], [place]       Patient verbalizes understanding of instructions provided today  No follow up schedule will continue to collaborate with CCM RN and PCP  Maurine Cane, LCSW

## 2020-10-13 NOTE — Chronic Care Management (AMB) (Signed)
Care Management Clinical Social Work Note  10/13/2020 Name: Charles Fox MRN: [id number] DOB: 09-Jan-1951  Charles Fox is a 70 y.o. year old male who is a primary care patient of Charles Pike, MD.  The Care Management team was consulted for assistance with chronic disease management and coordination needs.  Engaged with patient by telephone for follow up visit in response to provider referral for social work chronic care management and care coordination services  Consent to Services:  Charles Fox was given information about Care Management services today including:  1. Care Management services includes personalized support from designated clinical staff supervised by his physician, including individualized plan of care and coordination with other care providers 2. 24/7 contact phone numbers for assistance for urgent and routine care needs. 3. The patient may stop case management services at any time by phone call to the office staff.  Patient agreed to services and consent obtained.   Assessment: Patient continues to experience difficulty with housing. He is making progress and states he is going to visit an apartment on [address].  Reports he continues to work with Charles Fox.  Son is no longer living with him also reports running out of medication. Information shared with Charles Fox who is working to get pill packs for patient.See Care Plan below for interventions and patient self-care actives. Follow up Plan: No follow up scheduled.  Charles Fox will continue to collaborate with Charles Fox and Charles Fox for ongoing needs.  Review of patient past medical history, allergies, medications, and health status, including review of relevant consultants reports was performed today as part of a comprehensive evaluation and provision of chronic care management and care coordination services.  SDOH (Social Determinants of Health) assessments and interventions  performed:    Advanced Directives Status: Not addressed in this encounter.  Care Plan  No Known Allergies  Outpatient Encounter Medications as of 10/13/2020  Medication Sig Note  . Accu-Chek Softclix Lancets lancets Use as instructed   . Alcohol Swabs PADS He is to check blood glucose 2-3 times a day   . aspirin 81 MG tablet Take 1 tablet (81 mg total) by mouth daily. 06/11/2020: Taking two pills  . atorvastatin (LIPITOR) 40 MG tablet Take 1 tablet (40 mg total) by mouth daily. 06/11/2020: Out of meds  . blood glucose meter kit and supplies KIT Dispense based on patient and insurance preference. Use up to four times daily as directed. (FOR ICD-9 250.00, 250.01).   . Blood Glucose Monitoring Suppl (ACCU-CHEK AVIVA PLUS) w/Device KIT Check blood glucose before in AM while fasting.   . dorzolamide-timolol (COSOPT) 22.3-6.8 MG/ML ophthalmic solution Place 1 drop into both eyes 4 (four) times daily.   [name] glucose blood (ACCU-CHEK AVIVA PLUS) test strip Use as instructed   . insulin degludec (TRESIBA FLEXTOUCH) 100 UNIT/ML FlexTouch Pen Inject 30 Units into the skin daily before breakfast.   . Insulin Glargine (BASAGLAR KWIKPEN) 100 UNIT/ML    . Insulin Syringe-Needle U-100 (INSULIN SYRINGE .5CC/30GX1/2") 30G X 1/2" 0.5 ML MISC Use with glargine. Dispose after each use.   . losartan (COZAAR) 50 MG tablet Take 1 tablet (50 mg total) by mouth at bedtime. (Patient not taking: Reported on 06/11/2020)   . metFORMIN (GLUCOPHAGE-XR) 500 MG 24 hr tablet Take 1 tablet (500 mg total) by mouth 2 (two) times daily with a meal.    No facility-administered encounter medications on file as of 10/13/2020.    Patient Active Problem  List   Diagnosis Date Noted  . History of lacunar cerebrovascular accident 06/11/2020  . Dementia without behavioral disturbance ([place]) 05/27/2020  . Impaired vision 05/27/2020  . Pain, dental 11/20/2019  . High priority for COVID-19 virus vaccination 11/20/2019  . Chronic pain of both  shoulders 09/10/2019  . Tendinopathy of left rotator cuff 04/29/2019  . Food insecurity 07/09/2018  . Skin lesions, generalized 05/09/2018  . Hyperlipidemia associated with type 2 diabetes mellitus ([place]) 12/22/2016  . Urine test positive for microalbuminuria 05/27/2016  . Type 2 diabetes mellitus with neurological complications ([place]) 53/74/8270  . Morbid obesity ([place]) 09/04/2014  . Hypertension associated with diabetes ([place]) 09/04/2014  . Tobacco use disorder 09/04/2014    Conditions to be addressed/monitored: Housing barriers  Care Plan : obtain new housing  Updates made by Charles Cane, Charles Fox since 10/13/2020 12:00 AM  Problem: unable to afford current housing   Priority: Medium  Long-Range Goal: Comfort Maintained with affordable housing   Start Date: 07/15/2020  Expected End Date: 12/17/2020  Recent Progress: Not on track  Priority: Medium  Current Barriers:  . Waiting on update from Charles Fox  . Charles Fox has contacted patient and provided him a list of housing options.   . [employer] Case management has contact patient and assisted him with online applications.    . Difficult with navigating systems to complete process to get housing unit.   Clinical Goal(s) : assist with navigating housing options  Interventions provided by Charles Fox:  . Assessment of needs, barriers and progress   . Teach back of next steps . Solution-Focused Strategies;Problem Solving    . collaboration with Charles Pike, MD regarding development and update of comprehensive plan of care as evidenced by provider attestation and co-signature . Inter-disciplinary care team collaboration (see longitudinal plan of care) Patient Goals/Self-Care Activities: Over the next 30 days . Continue to work with Charles Fox  [address] [place], [place] [address] [phone] . Continue to work with Charles Fox:  [address], [place], [place] [address]  [phone]  Follow Up Plan: Charles Fox will continue to collaborate with Charles Fox and Charles Fox for ongoing needs   Problem: Barriers to Treatment with food   Goal: Over the next 10 days, patient will  contact Charles Fox to find out why his stamps stopped and what he needs to do to get them started again Completed 10/13/2020  Start Date: 09/10/2020  Expected End Date: 10/19/2020  Recent Progress: On track  Priority: High  Current barriers:  No Barriers patient has food stamps . Referral placed to [facility] they have contacted patient and he is on the meals on wheels waitlist . Patient reports his food stamps have stopped and he doesn't know why.  Requested food and money for gas to get home. His son and grandchildren continue to live with patient. [name] acknowledges deficits with meeting this unmet need  Clinical Interventions: collaboration with Charles Pike, MD regarding development and update of comprehensive plan of care as evidenced by provider attestation and co-signature . Inter-disciplinary care team collaboration (see longitudinal plan of care) . Assessment of needs, barriers , agencies contacted, as well as how impacting  . Review various resources and discussed options   . Provided [facility] [employer] and $5.00 for gas to get home . Provided patient with information about how to contact Charles Fox Patient Goals/Self-Care Activities: Over the next 10 days . call Charles Fox to renew your food stamps . [place]  [employer] [place] for Social Services: [phone]    . SNAP/ Food benefits and Medicaid:  [address], [place], [place] / [place]   [phone] 2:03 PM

## 2020-10-14 ENCOUNTER — Ambulatory Visit: Payer: Medicare Other

## 2020-10-14 ENCOUNTER — Other Ambulatory Visit: Payer: Self-pay | Admitting: Family Medicine

## 2020-10-14 DIAGNOSIS — E119 Type 2 diabetes mellitus without complications: Secondary | ICD-10-CM

## 2020-10-14 DIAGNOSIS — I10 Essential (primary) hypertension: Secondary | ICD-10-CM

## 2020-10-14 MED ORDER — METFORMIN HCL ER 500 MG PO TB24: 1000.0000 mg | ORAL_TABLET | Freq: Every day | ORAL | 3 refills | Status: DC

## 2020-10-14 MED ORDER — ASPIRIN EC 81 MG PO TBEC: 81.0000 mg | DELAYED_RELEASE_TABLET | Freq: Every day | ORAL | 3 refills | Status: AC

## 2020-10-14 MED ORDER — LOSARTAN POTASSIUM 50 MG PO TABS: 50.0000 mg | ORAL_TABLET | Freq: Every day | ORAL | 3 refills | Status: DC

## 2020-10-14 MED ORDER — ATORVASTATIN CALCIUM 40 MG PO TABS: 40.0000 mg | ORAL_TABLET | Freq: Every day | ORAL | 3 refills | Status: AC

## 2020-10-14 NOTE — Chronic Care Management (AMB) (Signed)
Care Management    RN Visit Note  10/14/2020 Name: Charles Fox MRN: [id number] DOB: 1951/09/11  Subjective: Charles Fox is a 70 y.o. year old male who is a primary care patient of Charles Pike, MD. The care management team was consulted for assistance with disease management and care coordination needs.    Engaged with patient by telephone for follow up visit in response to provider referral for case management and/or care coordination services.   Consent to Services:   Charles Fox was given information about Care Management services today including:  1. Care Management services includes personalized support from designated clinical staff supervised by his physician, including individualized plan of care and coordination with other care providers 2. 24/7 contact phone numbers for assistance for urgent and routine care needs. 3. The patient may stop case management services at any time by phone call to the office staff.  Patient agreed to services and consent obtained.    Assessment: Patient continues to experience difficulty with medication compliance... See Care Plan below for interventions and patient self-care actives. Follow up Plan: Patient would like continued follow-up.  CCM RNCM will outreach to the patient within the next 7 days. . Patient will call office if needed prior to next encounter : Review of patient past medical history, allergies, medications, health status, including review of consultants reports, laboratory and other test data, was performed as part of comprehensive evaluation and provision of chronic care management services.   SDOH (Social Determinants of Health) assessments and interventions performed:    Care Plan  No Known Allergies  Outpatient Encounter Medications as of 10/14/2020  Medication Sig Note  . aspirin 81 MG tablet Take 1 tablet (81 mg total) by mouth daily. 10/14/2020: Patient states that he is out   . atorvastatin (LIPITOR) 40 MG  tablet Take 1 tablet (40 mg total) by mouth daily. 06/11/2020: Out of meds  . insulin degludec (TRESIBA FLEXTOUCH) 100 UNIT/ML FlexTouch Pen Inject 30 Units into the skin daily before breakfast. 10/14/2020: Patient states that he has this medication  . Insulin Syringe-Needle U-100 (INSULIN SYRINGE .5CC/30GX1/2") 30G X 1/2" 0.5 ML MISC Use with glargine. Dispose after each use.   . metFORMIN (GLUCOPHAGE-XR) 500 MG 24 hr tablet Take 1 tablet (500 mg total) by mouth 2 (two) times daily with a meal. 10/14/2020: Patient states that he is out of this medication  . Accu-Chek Softclix Lancets lancets Use as instructed   . Alcohol Swabs PADS He is to check blood glucose 2-3 times a day   . blood glucose meter kit and supplies KIT Dispense based on patient and insurance preference. Use up to four times daily as directed. (FOR ICD-9 250.00, 250.01).   . Blood Glucose Monitoring Suppl (ACCU-CHEK AVIVA PLUS) w/Device KIT Check blood glucose before in AM while fasting.   . dorzolamide-timolol (COSOPT) 22.3-6.8 MG/ML ophthalmic solution Place 1 drop into both eyes 4 (four) times daily. (Patient not taking: Reported on 10/14/2020)   . glucose blood (ACCU-CHEK AVIVA PLUS) test strip Use as instructed   . Insulin Glargine (BASAGLAR KWIKPEN) 100 UNIT/ML  (Patient not taking: Reported on 10/14/2020)   . losartan (COZAAR) 50 MG tablet Take 1 tablet (50 mg total) by mouth at bedtime. (Patient not taking: Reported on 06/11/2020)    No facility-administered encounter medications on file as of 10/14/2020.    Patient Active Problem List   Diagnosis Date Noted  . History of lacunar cerebrovascular accident 06/11/2020  . Dementia without  behavioral disturbance (HCC) 05/27/2020  . Impaired vision 05/27/2020  . Pain, dental 11/20/2019  . High priority for COVID-19 virus vaccination 11/20/2019  . Chronic pain of both shoulders 09/10/2019  . Tendinopathy of left rotator cuff 04/29/2019  . Food insecurity 07/09/2018  . Skin  lesions, generalized 05/09/2018  . Hyperlipidemia associated with type 2 diabetes mellitus (HCC) 12/22/2016  . Urine test positive for microalbuminuria 05/27/2016  . Type 2 diabetes mellitus with neurological complications (HCC) 09/04/2014  . Morbid obesity (HCC) 09/04/2014  . Hypertension associated with diabetes (HCC) 09/04/2014  . Tobacco use disorder 09/04/2014    Conditions to be addressed/monitored: DMII  Care Plan : RN Case Manager  Updates made by Charles Fairly, RN since 10/14/2020 12:00 AM  Problem: Glycemic Management (Diabetes, Type 2)   Long-Range Goal: Glycemic Management Optimized   Start Date: 02/26/2020  Expected End Date: 01/16/2021  This Visit's Progress: Not on track  Recent Progress: Not on track  Priority: High  Objective:  Lab Results  Component Value Date   HGBA1C 7.6 (A) 09/10/2020 .   Lab Results  Component Value Date   CREATININE 0.74 (L) 05/26/2020   CREATININE 0.68 10/15/2019   CREATININE 0.82 04/11/2019    Current Barriers:  [name] Knowledge Deficits related to basic Diabetes pathophysiology and self care/management . Literacy barriers  Case Manager Clinical Goal(s):  Over the next 90 days, patient will demonstrate improved adherence to prescribed treatment plan for diabetes self care/management as evidenced by: daily monitoring and recording of CBG and adherence to prescribed medication regimen.  Interventions:  . Provided education to patient about basic DM disease process . Reviewed medications with patient and discussed importance of medication adherence-  Patient reports he doesn't have any of his medications except for Tresiba. Patient agreed to pill packs. RNCM contacted [facility] [phone] and spoke with Charles Fox to request the pill packs. RNCM sent patients primary care physician request to send his prescriptions to the pharmacy and the pharmacy was changed in his chart.  . Discussed plans with patient for ongoing care management  follow up and provided patient with direct contact information for care management team . Advised patient, providing education and rationale, to check cbg  and record, calling the office for findings outside established parameters.   . Review of patient status, including review of consultants reports, relevant laboratory and other test results, and medications completed. [name] activity based on tolerance and functional limitations encouraged- patient states that he has been able to walk daily . healthy lifestyle promoted  Anticipate A1C testing (point-of-care) every 3 to 6 months based on goal attainment.   Promote self-monitoring of blood glucose levels. The patient is not checking his blood sugars.  Discussed with the patient about the importance of checking his blood sugars.   He states that he understands but feels that his fingers are to tough. The patient has his food stamp card.  RNCM discussed with patient appropriate food choices  when using his food stamp card.  blood glucose readings reviewed . mutual A1C goal set or reviewed  Patient Goals/Self Care Activities:   Patient verbalizes understanding of plan Self-administers medications as prescribed  Calls pharmacy for medication refills  Call's provider office for new concerns or questions  check blood sugar at prescribed times  Getting the Relion glucometer at [facility] to check his blood ssugars.        Charles Fairly RN, BSN, [facility] Care Management Coordinator [facility] Phone: [phone] I Fax:  [phone]            

## 2020-10-14 NOTE — Patient Instructions (Signed)
Visit Information  Goals Addressed            This Visit's Progress   . Monitor and Manage My Blood Sugar-Diabetes Type 2       Timeframe:  Long-Range Goal Priority:  High Start Date:         10/01/20                    Expected End Date:  01/16/21                    02/1/221/21/22   - check blood sugar at prescribed times - take the blood sugar meter to all doctor visits  -Continue to take your medications as prescribed.   Why is this important?    Checking your blood sugar at home helps to keep it from getting very high or very low.   Writing the results in a diary or log helps the doctor know how to care for you.   Your blood sugar log should have the time, date and the results.   Also, write down the amount of insulin or other medicine that you take.   Other information, like what you ate, exercise done and how you were feeling, will also be helpful.            The patient verbalized understanding of instructions, educational materials, and care plan provided today and declined offer to receive copy of patient instructions, educational materials, and care plan.   Follow up Plan: Patient would like continued follow-up.  CCM RNCM will outreach to patient within the next 7 days. . Patient will call office if needed prior to next encounter  [name] RN, BSN, [facility] Care Management Coordinator [place] Phone: [phone] I Fax: [phone]

## 2020-10-20 ENCOUNTER — Telehealth: Payer: Self-pay

## 2020-10-20 ENCOUNTER — Telehealth: Payer: Medicare Other

## 2020-10-20 NOTE — Telephone Encounter (Signed)
  Care Management   Outreach Note  10/20/2020 Name: Charles Fox MRN: [id number] DOB: 03-Aug-1951  Referred by: [name], MD Reason for referral : Chronic Care Management (DM II)   An unsuccessful telephone outreach was attempted today. The patient was referred to the case management team for assistance with care management and care coordination.   Follow Up Plan: A HIPAA compliant phone message was left for the patient providing contact information and requesting a return call.  The care management team will reach out to the patient again over the next 7-14 days.   [name] RN, BSN, [facility] Care Management Coordinator [place] Phone: [phone] I Fax: [phone]

## 2020-10-21 ENCOUNTER — Telehealth: Payer: Self-pay | Admitting: *Deleted

## 2020-10-21 NOTE — Chronic Care Management (AMB) (Signed)
  Care Management   Note  10/21/2020 Name: Charles Fox MRN: [id number] DOB: Oct 09, 1950  Charles Fox is a 70 y.o. year old male who is a primary care patient of [name], MD and is actively engaged with the care management team. I reached out to [name] by phone today to assist with re-scheduling a follow up visit with the RN Case Manager  Follow up plan: Unsuccessful telephone outreach attempt made. A HIPAA compliant phone message was left for the patient providing contact information and requesting a return call.  The care management team will reach out to the patient again over the next 7 days.  If patient returns call to provider office, please advise to call [place] at [phone].  [place] Management

## 2020-10-23 ENCOUNTER — Ambulatory Visit: Payer: Medicare Other | Admitting: Licensed Clinical Social Worker

## 2020-10-23 DIAGNOSIS — Z7189 Other specified counseling: Secondary | ICD-10-CM

## 2020-10-23 NOTE — Chronic Care Management (AMB) (Signed)
Care Management Clinical Social Work Note  10/23/2020 Name: Charles Fox MRN: [id number] DOB: 1950-12-06  Charles Fox is a 70 y.o. year old male who is a primary care patient of [name], MD.  The Care Management team was consulted for assistance with chronic disease management and coordination needs.  Engaged with patient by telephone for follow up visit in response to provider referral for social work chronic care management and care coordination services  Consent to Services:  Patient agreed to services and consent obtained.   Assessment: Patient continues to experience difficulty with locating housing. this seems to be exacerbated by increased confusion, needs more redirecting, inability to have a conversation without jumping around, difficulty with making decisions,  as well as loosing things.   (Concerns shared with PCP via in-basket message).   Recent life changes [place]: Son moved out which has decreased his stress level Recommendation: Patient may benefit from, having an advance directive but continues to refuse.  Pending other recommendations from PCP Interventions: LCSW reached out to APS worker Minna Merritts [phone] and left message .[name] not sure is case is still open; collaborated with PCP & CCM RN for ongoing needs. Follow up Plan:   LCSW will wait for return call from [place] worker  Patient would like continued follow-up.  CCM LCSW will continue to collaborate with care team for ongoing needs.    No follow- up scheduled with  LCSW. Appointment with CCM RN 10/27/20.    Patient will call office if needed prior to next encounter    Review of patient past medical history, allergies, medications, and health status, including review of relevant consultants reports was performed today as part of a comprehensive evaluation and provision of chronic care management and care coordination services.  SDOH (Social Determinants of Health) assessments and interventions performed:     Advanced Directives Status: Not ready or willing to discuss.  Care Plan  No Known Allergies  Outpatient Encounter Medications as of 10/23/2020  Medication Sig Note  . Accu-Chek Softclix Lancets lancets Use as instructed   . Alcohol Swabs PADS He is to check blood glucose 2-3 times a day   . aspirin EC 81 MG tablet Take 1 tablet (81 mg total) by mouth daily. Swallow whole.   [name] atorvastatin (LIPITOR) 40 MG tablet Take 1 tablet (40 mg total) by mouth daily.   . blood glucose meter kit and supplies KIT Dispense based on patient and insurance preference. Use up to four times daily as directed. (FOR ICD-9 250.00, 250.01).   . Blood Glucose Monitoring Suppl (ACCU-CHEK AVIVA PLUS) w/Device KIT Check blood glucose before in AM while fasting.   . dorzolamide-timolol (COSOPT) 22.3-6.8 MG/ML ophthalmic solution Place 1 drop into both eyes 4 (four) times daily. (Patient not taking: Reported on 10/14/2020)   . glucose blood (ACCU-CHEK AVIVA PLUS) test strip Use as instructed   . insulin degludec (TRESIBA FLEXTOUCH) 100 UNIT/ML FlexTouch Pen Inject 30 Units into the skin daily before breakfast. 10/14/2020: Patient states that he has this medication  . Insulin Glargine (BASAGLAR KWIKPEN) 100 UNIT/ML  (Patient not taking: Reported on 10/14/2020)   . Insulin Syringe-Needle U-100 (INSULIN SYRINGE .5CC/30GX1/2") 30G X 1/2" 0.5 ML MISC Use with glargine. Dispose after each use.   . losartan (COZAAR) 50 MG tablet Take 1 tablet (50 mg total) by mouth at bedtime.   . metFORMIN (GLUCOPHAGE-XR) 500 MG 24 hr tablet Take 2 tablets (1,000 mg total) by mouth daily with breakfast.    No  facility-administered encounter medications on file as of 10/23/2020.    Patient Active Problem List   Diagnosis Date Noted  . History of lacunar cerebrovascular accident 06/11/2020  . Dementia without behavioral disturbance ([place]) 05/27/2020  . Impaired vision 05/27/2020  . Pain, dental 11/20/2019  . High priority for COVID-19 virus  vaccination 11/20/2019  . Chronic pain of both shoulders 09/10/2019  . Tendinopathy of left rotator cuff 04/29/2019  . Food insecurity 07/09/2018  . Skin lesions, generalized 05/09/2018  . Hyperlipidemia associated with type 2 diabetes mellitus ([place]) 12/22/2016  . Urine test positive for microalbuminuria 05/27/2016  . Type 2 diabetes mellitus with neurological complications ([place]) 35/24/8185  . Morbid obesity ([place]) 09/04/2014  . Hypertension associated with diabetes ([place]) 09/04/2014  . Tobacco use disorder 09/04/2014    Conditions to be addressed/monitored: Dementia; Housing barriers, Level of care concerns and Cognitive Deficits  Care Plan : obtain new housing  Updates made by Maurine Cane, LCSW since 10/23/2020 12:00 AM  Problem: unable to afford current housing   Priority: Medium  Long-Range Goal: Comfort Maintained with affordable housing   Start Date: 07/15/2020  Expected End Date: 12/17/2020  Recent Progress: Not on track  Priority: Medium  Current Barriers:  . Confusion at times . Has contacted apartment on [address] and will move forward  . Waiting on update from [employer]  . [employer] has contacted patient and provided him a list of housing options.   . [employer] Case management has contact patient and assisted him with online applications.    . Difficult with navigating systems to complete process to get housing unit.   Clinical Goal(s) : assist with navigating housing options  Interventions provided by LCSW:  . Assessment of needs, barriers and progress   . Teach back of next steps . Solution-Focused Strategies;Problem Solving    . collaboration with [name], MD regarding development and update of comprehensive plan of care as evidenced by provider attestation and co-signature . Inter-disciplinary care team collaboration (see longitudinal plan of care) Patient Goals/Self-Care Activities: Over the next 30  days . Follow-up with apartments that you like on [address]. . Continue to work with [facility]  [address] [place], [place] [address] [phone] . Continue to work with [employer]:  [address] [place], [place], [place] [address]  [phone]  Follow Up Plan: LCSW will continue to collaborate with CCM RN and PCP for ongoing needs     [name], [place] / [place]   [phone] 11:24 AM

## 2020-10-23 NOTE — Patient Instructions (Signed)
Visit Information  Goals Addressed            This Visit's Progress   . I need new housing   Not on track    Patient Goals/Self-Care Activities:  Follow-up with apartments that you like on [address].  Continue to work with [facility]  [address] [place], [place] [address] [phone]  Continue to work with [employer]:  [address] [place], [place], [place] [address]  [phone]  . Affordable Housing Management:  is another option for housing [address] [place] [address] [place], [place] [address] [phone]       Patient verbalizes understanding of instructions provided today and agrees to view in [place].   Telephone follow up appointment with care management team member scheduled for:10/27/20  Maurine Cane, LCSW

## 2020-10-27 ENCOUNTER — Telehealth: Payer: Medicare Other

## 2020-10-29 ENCOUNTER — Ambulatory Visit: Payer: Self-pay | Admitting: Licensed Clinical Social Worker

## 2020-10-29 NOTE — Chronic Care Management (AMB) (Signed)
  Care Management  Collaboration  Note  10/29/2020 Name: [name]  Charles Fox is a 70 y.o. year old male who is a primary care patient of [name], MD. The CCM team was consulted for Collaboration reference care coordination needs.   Assessment: [facility] case is no longer open. No identified needs for patient.  Patient is able to make his own decisions until someone determines that he is no longer able to do this.  Though he presents with confusion and forgetting he is alert and oriented x 4.  Intervention: Patient was not interviewed or contacted during this encounter.   CCM LCSW collaborated with DSS APS worker Charles Fox [phone].  Conducted brief assessment, recommendations and relevant information discussed.   Follow up Plan: no f/u scheduled f/u appointments with LCSW.  Patient has missed the last two CCM RN appointment.  LCSW will continue to collaborate with CCM team and PCP for ongoing needs.  Collaboration with [name], MD regarding development and update of comprehensive plan of care as evidenced by provider attestation and co-signature Review of patient past medical history, allergies, medications, and health status, including review of pertinent consultant reports was performed as part of comprehensive evaluation and provision of care management/care coordination services.   [name], [place] / [place]   [phone] 3:38 PM

## 2020-10-29 NOTE — Chronic Care Management (AMB) (Signed)
  Care Management   Note  10/29/2020 Name: Charles Fox MRN: [id number] DOB: 10-03-1950  Charles Fox is a 70 y.o. year old male who is a primary care patient of [name], MD and is actively engaged with the care management team. I reached out to [name] by phone today to assist with re-scheduling a follow up visit with the RN Case Manager  Follow up plan: Unsuccessful telephone outreach attempt made. A HIPAA compliant phone message was left for the patient providing contact information and requesting a return call.  The care management team will reach out to the patient again over the next 7 days.  If patient returns call to provider office, please advise to call [place] at [phone].  [place] Management

## 2020-11-09 NOTE — Chronic Care Management (AMB) (Signed)
  Care Management   Note  11/09/2020 Name: FARREL GUIMOND MRN: [id number] DOB: [birth year]  BASSAM DRESCH is a 70 y.o. year old male who is a primary care patient of [name], MD and is actively engaged with the care management team. I reached out to [name] by phone today to assist with re-scheduling a follow up visit with the RN Case Manager  Follow up plan: Telephone appointment with care management team member scheduled for:11/10/2020  [place] Management

## 2020-11-09 NOTE — Telephone Encounter (Signed)
Spoke with patient scheduled for 11/10/20

## 2020-11-10 ENCOUNTER — Ambulatory Visit: Payer: Medicare HMO

## 2020-11-10 DIAGNOSIS — Z139 Encounter for screening, unspecified: Secondary | ICD-10-CM

## 2020-11-10 NOTE — Patient Instructions (Signed)
Visit Information  Charles Fox  it was nice speaking with you. Please call me directly [phone] if you have questions about the goals we discussed.  Goals Addressed              This Visit's Progress   .  Client will have food insecurities addressed within the next 10 days (pt-stated)        Timeframe:  Long-Range Goal Priority:  High Start Date:   11/10/20                          Expected End Date:   11/24/20                     Patient [place]/Self Care Activities:  . Self-administers medications as prescribed . Attends all scheduled provider appointments . Stay in contact with Care Guides . Use resources given for Food       The patient verbalized understanding of instructions, educational materials, and care plan provided today and declined offer to receive copy of patient instructions, educational materials, and care plan.   Follow up Plan: Patient would like continued follow-up.  CCM RNCM  will will follow up with the patient within the next 14 days. Patient will call office if needed prior to next encounter  [name], RN

## 2020-11-10 NOTE — Chronic Care Management (AMB) (Signed)
Care Management    RN Visit Note  11/10/2020 Name: Charles Fox MRN: [id number] DOB: Apr 19, 1951  Subjective: Charles Fox is a 70 y.o. year old male who is a primary care patient of [name], MD. The care management team was consulted for assistance with disease management and care coordination needs.    Engaged with patient by telephone for follow up visit in response to provider referral for case management and/or care coordination services.   Consent to Services:   Mr. [name] was given information about Care Management services today including:  1. Care Management services includes personalized support from designated clinical staff supervised by his physician, including individualized plan of care and coordination with other care providers 2. 24/7 contact phone numbers for assistance for urgent and routine care needs. 3. The patient may stop case management services at any time by phone call to the office staff.  Patient agreed to services and consent obtained.    Assessment: Patient is currently experiencing difficulty with Food insecurity.. See Care Plan below for interventions and patient self-care actives. Follow up Plan: Patient would like continued follow-up.  CCM RNCM will outreach tothe ptient within the next 14 days.. Patient will call office if needed prior to next encounter  Review of patient past medical history, allergies, medications, health status, including review of consultants reports, laboratory and other test data, was performed as part of comprehensive evaluation and provision of chronic care management services.   SDOH (Social Determinants of Health) assessments and interventions performed:    Care Plan  No Known Allergies  Outpatient Encounter Medications as of 11/10/2020  Medication Sig Note  . Accu-Chek Softclix Lancets lancets Use as instructed   . Alcohol Swabs PADS He is to check blood glucose 2-3 times a day   . aspirin EC 81 MG tablet  Take 1 tablet (81 mg total) by mouth daily. Swallow whole.   [name] atorvastatin (LIPITOR) 40 MG tablet Take 1 tablet (40 mg total) by mouth daily.   . blood glucose meter kit and supplies KIT Dispense based on patient and insurance preference. Use up to four times daily as directed. (FOR ICD-9 250.00, 250.01).   . Blood Glucose Monitoring Suppl (ACCU-CHEK AVIVA PLUS) w/Device KIT Check blood glucose before in AM while fasting.   . dorzolamide-timolol (COSOPT) 22.3-6.8 MG/ML ophthalmic solution Place 1 drop into both eyes 4 (four) times daily. (Patient not taking: Reported on 10/14/2020)   . glucose blood (ACCU-CHEK AVIVA PLUS) test strip Use as instructed   . insulin degludec (TRESIBA FLEXTOUCH) 100 UNIT/ML FlexTouch Pen Inject 30 Units into the skin daily before breakfast. 10/14/2020: Patient states that he has this medication  . Insulin Glargine (BASAGLAR KWIKPEN) 100 UNIT/ML  (Patient not taking: Reported on 10/14/2020)   . Insulin Syringe-Needle U-100 (INSULIN SYRINGE .5CC/30GX1/2") 30G X 1/2" 0.5 ML MISC Use with glargine. Dispose after each use.   . losartan (COZAAR) 50 MG tablet Take 1 tablet (50 mg total) by mouth at bedtime.   . metFORMIN (GLUCOPHAGE-XR) 500 MG 24 hr tablet Take 2 tablets (1,000 mg total) by mouth daily with breakfast.    No facility-administered encounter medications on file as of 11/10/2020.    Patient Active Problem List   Diagnosis Date Noted  . History of lacunar cerebrovascular accident 06/11/2020  . Dementia without behavioral disturbance ([place]) 05/27/2020  . Impaired vision 05/27/2020  . Pain, dental 11/20/2019  . High priority for COVID-19 virus vaccination 11/20/2019  . Chronic pain of  both shoulders 09/10/2019  . Tendinopathy of left rotator cuff 04/29/2019  . Food insecurity 07/09/2018  . Skin lesions, generalized 05/09/2018  . Hyperlipidemia associated with type 2 diabetes mellitus ([place]) 12/22/2016  . Urine test positive for microalbuminuria 05/27/2016  .  Type 2 diabetes mellitus with neurological complications ([place]) 67/09/4101  . Morbid obesity ([place]) 09/04/2014  . Hypertension associated with diabetes ([place]) 09/04/2014  . Tobacco use disorder 09/04/2014    Conditions to be addressed/monitored: Acute Food Insecurity  Care Plan : obtain new housing  Updates made by [name], RN since 11/10/2020 12:00 AM  Problem: Acute Food Insecurity   Goal: Over the next 10 days, patient will  be in contact with the care guides for help with food   Start Date: 11/10/2020  Expected End Date: 11/17/2020  This Visit's Progress: Not on track  Recent Progress: On track  Priority: High  Note:   Current barriers:  Food insecurity patient has no money on his food stamp card or other means at this time to get food . Referral placed to Care Guides to for resources for food they will contacted the patient  . Patient reports he has food stamps but he only has .49 cents on the card and it will not be re supplied until the 19th of next month.  RNCM and Patient called the food stamp number together to find out information at [phone] . acknowledges deficits with meeting this unmet need  Clinical Interventions: collaboration with [name], MD regarding development and update of comprehensive plan of care as evidenced by provider attestation and co-signature . Inter-disciplinary care team collaboration (see longitudinal plan of care) . Assessment of needs, barriers , agencies contacted, as well as how impacting  . Review various resources and discussed options   . Collaborated with LCSW Moore  Patient Goals/Self-Care Activities: Over the next 10 days .  stay near his phone and answer calls from care guides    [name] RN, BSN, [facility] Care Management Coordinator [place] Phone: [phone] I Fax: [phone]

## 2020-11-13 ENCOUNTER — Telehealth: Payer: Self-pay | Admitting: Family Medicine

## 2020-11-13 NOTE — Telephone Encounter (Signed)
   Telephone encounter was:  Successful.  11/13/2020 Name: Charles Fox MRN: [id number] DOB: Oct 15, 1950  Charles Fox is a 70 y.o. year old male who is a primary care patient of [name], MD . The community resource team was consulted for assistance with [place] guide performed the following interventions: Spoke with [name] regarding referral. Patient stated that he has received his food stamps for the month and has no addtional needs at this time. Care Guide informed patient that he can give the office a call if anything changes. .  Follow Up Plan:  No further follow up planned at this time. The patient has been provided with needed resources.   [place], Care Management Phone: [phone] Email: [email or web address]

## 2020-11-23 ENCOUNTER — Encounter (INDEPENDENT_AMBULATORY_CARE_PROVIDER_SITE_OTHER): Payer: Self-pay | Admitting: Ophthalmology

## 2020-11-23 ENCOUNTER — Ambulatory Visit (INDEPENDENT_AMBULATORY_CARE_PROVIDER_SITE_OTHER): Payer: Medicare HMO | Admitting: Ophthalmology

## 2020-11-23 ENCOUNTER — Other Ambulatory Visit: Payer: Self-pay

## 2020-11-23 DIAGNOSIS — E113513 Type 2 diabetes mellitus with proliferative diabetic retinopathy with macular edema, bilateral: Secondary | ICD-10-CM

## 2020-11-23 DIAGNOSIS — H25813 Combined forms of age-related cataract, bilateral: Secondary | ICD-10-CM | POA: Diagnosis not present

## 2020-11-23 DIAGNOSIS — H3581 Retinal edema: Secondary | ICD-10-CM | POA: Diagnosis not present

## 2020-11-23 DIAGNOSIS — H40053 Ocular hypertension, bilateral: Secondary | ICD-10-CM

## 2020-11-23 DIAGNOSIS — H35033 Hypertensive retinopathy, bilateral: Secondary | ICD-10-CM

## 2020-11-23 DIAGNOSIS — I10 Essential (primary) hypertension: Secondary | ICD-10-CM | POA: Diagnosis not present

## 2020-11-23 MED ORDER — BEVACIZUMAB CHEMO INJECTION 1.25MG/0.05ML SYRINGE FOR KALEIDOSCOPE
1.2500 mg | INTRAVITREAL | Status: AC | PRN
  Administered 2020-11-23: 1.25 mg via INTRAVITREAL

## 2020-11-23 NOTE — Progress Notes (Signed)
[facility] Note  11/23/2020     CHIEF COMPLAINT Patient presents for Retina Follow Up   HISTORY OF PRESENT ILLNESS: Charles Fox is a 70 y.o. male who presents to the clinic today for:   HPI    Retina Follow Up    Patient presents with  Diabetic Retinopathy.  In both eyes.  This started months ago.  Severity is moderate.  Duration of 5 months.  Since onset it is gradually worsening.  I, the attending physician,  performed the HPI with the patient and updated documentation appropriately.          Comments    71 y/o male pt here for 5 mo f/u for PDR w/DME OU (OS>OD).  Pt last seen 10.14.21, and was supposed to return 11.1.21 for PRP OS.  Pt has not noticed any change in [place] OU, but c/o new floaters OD and FBS OD over the last several mos.  No changes reported OS.  Denies pain.  No gtts.  BS and A1C unknown.       Last edited by Bernarda Caffey, MD on 11/23/2020  4:08 PM. (History)      Referring physician: Debbra Riding, MD [address] [place],  [place] [address]  HISTORICAL INFORMATION:   Selected notes from the MEDICAL RECORD NUMBER Referred by Dr. Zenia Resides   CURRENT MEDICATIONS: Current Outpatient Medications (Ophthalmic Drugs)  Medication Sig  . dorzolamide-timolol (COSOPT) 22.3-6.8 MG/ML ophthalmic solution Place 1 drop into both eyes 4 (four) times daily.   No current facility-administered medications for this visit. (Ophthalmic Drugs)   Current Outpatient Medications (Other)  Medication Sig  . Accu-Chek Softclix Lancets lancets Use as instructed  . Alcohol Swabs PADS He is to check blood glucose 2-3 times a day  . aspirin EC 81 MG tablet Take 1 tablet (81 mg total) by mouth daily. Swallow whole.  [name] atorvastatin (LIPITOR) 40 MG tablet Take 1 tablet (40 mg total) by mouth daily.  . blood glucose meter kit and supplies KIT Dispense based on patient and insurance preference. Use up to four times daily as directed. (FOR ICD-9  250.00, 250.01).  . Blood Glucose Monitoring Suppl (ACCU-CHEK AVIVA PLUS) w/Device KIT Check blood glucose before in AM while fasting.  [name] glucose blood (ACCU-CHEK AVIVA PLUS) test strip Use as instructed  . insulin degludec (TRESIBA FLEXTOUCH) 100 UNIT/ML FlexTouch Pen Inject 30 Units into the skin daily before breakfast.  . Insulin Glargine (BASAGLAR KWIKPEN) 100 UNIT/ML   . Insulin Syringe-Needle U-100 (INSULIN SYRINGE .5CC/30GX1/2") 30G X 1/2" 0.5 ML MISC Use with glargine. Dispose after each use.  . losartan (COZAAR) 50 MG tablet Take 1 tablet (50 mg total) by mouth at bedtime.  . metFORMIN (GLUCOPHAGE-XR) 500 MG 24 hr tablet Take 2 tablets (1,000 mg total) by mouth daily with breakfast.   No current facility-administered medications for this visit. (Other)      REVIEW OF SYSTEMS: ROS    Positive for: Neurological, Endocrine, Eyes   Negative for: Constitutional, Gastrointestinal, Skin, Genitourinary, Musculoskeletal, HENT, Cardiovascular, Respiratory, Psychiatric, Allergic/Imm, Heme/Lymph   Last edited by Matthew Folks, COA on 11/23/2020  9:32 AM. (History)       ALLERGIES No Known Allergies  PAST MEDICAL HISTORY Past Medical History:  Diagnosis Date  . Cataract    Mixed form OU  . Diabetes mellitus without complication ([place])   . Diabetic retinopathy ([place])    PDR OU  . Hypertension   .  Hypertensive retinopathy    OU   Past Surgical History:  Procedure Laterality Date  . BACK SURGERY    . FINGER SURGERY      FAMILY HISTORY History reviewed. No pertinent family history.  SOCIAL HISTORY Social History   Tobacco Use  . Smoking status: Current Every Day Smoker    Packs/day: 0.50    Types: Cigarettes  . Smokeless tobacco: Never Used  Vaping Use  . Vaping Use: Never used  Substance Use Topics  . Alcohol use: No  . Drug use: Not Currently         OPHTHALMIC EXAM:  Base Eye Exam    Visual Acuity (Snellen - Linear)      Right Left   Dist cc 20/400  20/40 -2   Dist ph cc NI NI   Correction: Glasses       Tonometry (Tonopen, 9:35 AM)      Right Left   Pressure 23 24       Pupils      Dark Light Shape React APD   Right 3 2 Round Brisk None   Left 3 2 Round Brisk None       Visual Fields (Counting fingers)      Left Right    Full Full       Extraocular Movement      Right Left    Full, Ortho Full, Ortho       Neuro/Psych    Oriented x3: Yes   Mood/Affect: Normal       Dilation    Both eyes: 1.0% Mydriacyl, 2.5% Phenylephrine @ 9:35 AM        Slit Lamp and Fundus Exam    Slit Lamp Exam      Right Left   Lids/Lashes Dermatochalasis - upper lid, mild Meibomian gland dysfunction Dermatochalasis - upper lid, mild Meibomian gland dysfunction   Conjunctiva/Sclera Mild Melanosis Mild Melanosis   Cornea arcus, trace Punctate epithelial erosions arcus, trace Punctate epithelial erosions   Anterior Chamber Deep and quiet Deep and quiet   Iris Round and dilated, +NVI Round and dilated, +early NVI   Lens 2-3+ Nuclear sclerosis, 2-3+ Cortical cataract, +RBCs on posterior capsule 2-3+ Nuclear sclerosis, 2-3+ Cortical cataract   Vitreous Vitreous syneresis, diffuse VH Vitreous syneresis, blood stained vitreous condensations improving and settled inferiorly       Fundus Exam      Right Left   Disc hazy view, perfused mild Pallor, Sharp rim, fibrosis, fine NVD   C/D Ratio 0.4 0.6   Macula No view Flat, Blunted foveal reflex, +edema   Vessels Vascular attenuation, Tortuous, +NV, sclerotic arterioles nasal periphery attenuated, Tortuous, Copper wiring   Periphery Hazy view, grossly attached, old VH settled IN Attached, scattered DBH        Refraction    Manifest Refraction      Sphere Cylinder Axis Dist VA   Right -3.25 +0.50 120 20/400   Left              IMAGING AND PROCEDURES  Imaging and Procedures for 11/23/2020  OCT, Retina - OU - Both Eyes       Right Eye Quality was poor. Progression has worsened.  Findings include abnormal foveal contour, intraretinal fluid, intraretinal hyper-reflective material, epiretinal membrane, no SRF (Only radial scan obtained, +vitreous opacities).   Left Eye Quality was borderline. Central Foveal Thickness: 359. Progression has worsened. Findings include abnormal foveal contour, intraretinal fluid, intraretinal hyper-reflective material, epiretinal membrane, no SRF (+DME -  interval increase in IRF, mild improvement in vitreous opacities).   Notes *Images captured and stored on drive  Diagnosis / Impression:  OD: +DME -- Only radial scan obtained due to vitreous opacities OS: +DME -- interval increase in IRF, mild improvement in vitreous opacities  . Clinical management:  See below  Abbreviations: NFP - Normal foveal profile. CME - cystoid macular edema. PED - pigment epithelial detachment. IRF - intraretinal fluid. SRF - subretinal fluid. EZ - ellipsoid zone. ERM - epiretinal membrane. ORA - outer retinal atrophy. ORT - outer retinal tubulation. SRHM - subretinal hyper-reflective material. IRHM - intraretinal hyper-reflective material        Intravitreal Injection, Pharmacologic Agent - OD - Right Eye       Time Out 11/23/2020. 10:16 AM. Confirmed correct patient, procedure, site, and patient consented.   Anesthesia Topical anesthesia was used. Anesthetic medications included Lidocaine 2%, Proparacaine 0.5%.   Procedure Preparation included 5% betadine to ocular surface, eyelid speculum. A supplied needle was used.   Injection:  1.25 mg Bevacizumab (AVASTIN) 1.65m/0.05mL SOLN   NDC: 580881-103-15 Lot:: [id number] Expiration date: 12/28/2020   Route: Intravitreal, Site: Right Eye, Waste: 0 mL  Post-op Post injection exam found visual acuity of at least counting fingers. The patient tolerated the procedure well. There were no complications. The patient received written and verbal post procedure care education. Post injection medications were not  given.        Intravitreal Injection, Pharmacologic Agent - OS - Left Eye       Time Out 11/23/2020. 10:16 AM. Confirmed correct patient, procedure, site, and patient consented.   Anesthesia Topical anesthesia was used. Anesthetic medications included Lidocaine 2%, Proparacaine 0.5%.   Procedure Preparation included 5% betadine to ocular surface, eyelid speculum. A (32g) needle was used.   Injection:  1.25 mg Bevacizumab (AVASTIN) 1.239m0.05mL SOLN   NDC: 5092446-286-38Lot: [id number], Expiration date: 01/26/2021   Route: Intravitreal, Site: Left Eye, Waste: 0.05 mL  Post-op Post injection exam found visual acuity of at least counting fingers. The patient tolerated the procedure well. There were no complications. The patient received written and verbal post procedure care education. Post injection medications were not given.                 ASSESSMENT/PLAN:    ICD-10-CM   1. Proliferative diabetic retinopathy of both eyes with macular edema associated with type 2 diabetes mellitus (HCC)  E1T77.1165ntravitreal Injection, Pharmacologic Agent - OD - Right Eye    Intravitreal Injection, Pharmacologic Agent - OS - Left Eye    Bevacizumab (AVASTIN) SOLN 1.25 mg    Bevacizumab (AVASTIN) SOLN 1.25 mg  2. Retinal edema  H35.81 OCT, Retina - OU - Both Eyes  3. Essential hypertension  I10   4. Hypertensive retinopathy of both eyes  H35.033   5. 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 Proliferative diabetic retinopathy w/ DME, OU (OS > OD) - initial exam showed +NVI OU, VH OU, +NVD OU, +NVE OU, scattered MA/IRH OU - FA (09.10.21) shows late-leaking MA, vascular nonperfusion, +NV -- will need PRP OU  - s/p IVA OU #1 (09.10.21), #2 (10.14.21)  - s/p PRP OD (10.06.21)  **pt lost to f/u since 10.14.21 (5 mos)**  - BCVA OD 20/400 (down from 20/100); OS 20/40 (improved from 20/60)  - exam shows new VH OD  - OCT  shows OD: Only radial scan  obtained due to vit opacities; OS: +DME -- interval increase in IRF, mild improvement in vitreous opacities - recommend IVA OU #3 today, 03.07.21 for DME - pt wishes to proceed  - RBA of procedure discussed, questions answered  - Avastin informed consent obtained, signed and scanned, 09.10.21 - see procedure note - f/u 2 weeks, VH check, DFE, OCT, possible PRP OS  3,4. Hypertensive retinopathy OU - discussed importance of tight BP control - monitor  5. Mixed Cataract OU - The symptoms of cataract, surgical options, and treatments and risks were discussed with patient. - discussed diagnosis and progression - under the expert management of Dr. Chauncey Cruel. [place]  6. Ocular Hypertension  - IOP today: 23,24  - start Cosopt BID OU   Ophthalmic Meds Ordered this visit:  Meds ordered this encounter  Medications  . Bevacizumab (AVASTIN) SOLN 1.25 mg  . Bevacizumab (AVASTIN) SOLN 1.25 mg       Return in about 2 weeks (around 12/07/2020) for f/u VH check OD, possible PRP OS, DFE, OCT.  There are no Patient Instructions on file for this visit.   Explained the diagnoses, plan, and follow up with the patient and they expressed understanding.  Patient expressed understanding of the importance of proper follow up care.   This document serves as a record of services personally performed by Gardiner Sleeper, MD, PhD. It was created on their behalf by Estill Bakes, COT an ophthalmic technician. The creation of this record is the provider's dictation and/or activities during the visit.    Electronically signed by: Estill Bakes, COT 3.7.22 @ 4:20 PM   This document serves as a record of services personally performed by Gardiner Sleeper, MD, PhD. It was created on their behalf by San Jetty. Owens Shark, OA an ophthalmic technician. The creation of this record is the provider's dictation and/or activities during the visit.    Electronically signed by: San Jetty. Owens Shark, [place] 03.07.2022  4:20 PM  Gardiner Sleeper, M.D., Ph.D. Diseases & Surgery of the Retina and [place] 11/23/2020   I have reviewed the above documentation for accuracy and completeness, and I agree with the above. Gardiner Sleeper, M.D., Ph.D. 11/23/20 4:20 PM   Abbreviations: M myopia (nearsighted); A astigmatism; H hyperopia (farsighted); P presbyopia; Mrx spectacle prescription;  CTL contact lenses; OD right eye; OS left eye; OU both eyes  XT exotropia; ET esotropia; PEK punctate epithelial keratitis; PEE punctate epithelial erosions; DES dry eye syndrome; MGD meibomian gland dysfunction; ATs artificial tears; PFAT's preservative free artificial tears; [place] nuclear sclerotic cataract; PSC posterior subcapsular cataract; ERM epi-retinal membrane; PVD posterior vitreous detachment; RD retinal detachment; DM diabetes mellitus; DR diabetic retinopathy; NPDR non-proliferative diabetic retinopathy; PDR proliferative diabetic retinopathy; CSME clinically significant macular edema; DME diabetic macular edema; dbh dot blot hemorrhages; CWS cotton wool spot; POAG primary open angle glaucoma; C/D cup-to-disc ratio; HVF humphrey visual field; GVF goldmann visual field; OCT optical coherence tomography; IOP intraocular pressure; BRVO Branch retinal vein occlusion; CRVO central retinal vein occlusion; CRAO central retinal artery occlusion; BRAO branch retinal artery occlusion; RT retinal tear; SB scleral buckle; PPV pars plana vitrectomy; VH Vitreous hemorrhage; PRP panretinal laser photocoagulation; IVK intravitreal kenalog; VMT vitreomacular traction; MH Macular hole;  NVD neovascularization of the disc; NVE neovascularization elsewhere; AREDS age related eye disease study; ARMD age related macular degeneration; POAG primary open angle glaucoma; EBMD epithelial/anterior basement membrane dystrophy; ACIOL anterior chamber intraocular lens; IOL intraocular lens; PCIOL posterior chamber intraocular  lens; Phaco/IOL phacoemulsification with intraocular lens placement; [place] photorefractive keratectomy; LASIK laser assisted in situ keratomileusis; HTN hypertension; DM diabetes mellitus; COPD chronic obstructive pulmonary disease

## 2020-11-27 ENCOUNTER — Ambulatory Visit (INDEPENDENT_AMBULATORY_CARE_PROVIDER_SITE_OTHER): Payer: Medicare HMO | Admitting: Family Medicine

## 2020-11-27 ENCOUNTER — Other Ambulatory Visit: Payer: Self-pay | Admitting: Family Medicine

## 2020-11-27 ENCOUNTER — Encounter: Payer: Self-pay | Admitting: Family Medicine

## 2020-11-27 ENCOUNTER — Other Ambulatory Visit (HOSPITAL_COMMUNITY): Payer: Self-pay | Admitting: Family Medicine

## 2020-11-27 ENCOUNTER — Other Ambulatory Visit: Payer: Self-pay

## 2020-11-27 VITALS — BP 150/80 | HR 92 | Wt 263.4 lb

## 2020-11-27 DIAGNOSIS — I152 Hypertension secondary to endocrine disorders: Secondary | ICD-10-CM

## 2020-11-27 DIAGNOSIS — E1149 Type 2 diabetes mellitus with other diabetic neurological complication: Secondary | ICD-10-CM

## 2020-11-27 DIAGNOSIS — M546 Pain in thoracic spine: Secondary | ICD-10-CM | POA: Insufficient documentation

## 2020-11-27 DIAGNOSIS — E1159 Type 2 diabetes mellitus with other circulatory complications: Secondary | ICD-10-CM | POA: Diagnosis not present

## 2020-11-27 DIAGNOSIS — E119 Type 2 diabetes mellitus without complications: Secondary | ICD-10-CM

## 2020-11-27 DIAGNOSIS — Z794 Long term (current) use of insulin: Secondary | ICD-10-CM

## 2020-11-27 LAB — POCT GLYCOSYLATED HEMOGLOBIN (HGB A1C): HbA1c, POC (controlled diabetic range): 7.8 % — AB (ref 0.0–7.0)

## 2020-11-27 MED ORDER — TRESIBA FLEXTOUCH 100 UNIT/ML ~~LOC~~ SOPN: 30.0000 [IU] | PEN_INJECTOR | Freq: Every day | SUBCUTANEOUS | Status: DC

## 2020-11-27 MED ORDER — LOSARTAN POTASSIUM 50 MG PO TABS: 50.0000 mg | ORAL_TABLET | Freq: Every day | ORAL | 3 refills | Status: DC

## 2020-11-27 MED FILL — METFORMIN HCL ER 500 MG TB2: 500 | 30 days supply | Qty: 60 | Fill #1

## 2020-11-27 MED FILL — TRESIBA FLEXTOUCH 100 UNITS: 100 | 30 days supply | Qty: 9 | Fill #1

## 2020-11-27 NOTE — Patient Instructions (Addendum)
Thank you for coming in to see [place] today! Please see below to review our plan for today's visit:  1. Take Tresiba 30 units daily. 2. If you develop worsening back pain please let us know.  3. Make sure you're taking your Cozaar/Losartan every day.  Please call the clinic at [phone] if your symptoms worsen or you have any concerns. It was our pleasure to serve you!   Dr. Milus Banister [facility]

## 2020-11-27 NOTE — Assessment & Plan Note (Signed)
Started after a fall that occurred 2-3 days ago.  Patient reports his symptoms are overall improved, denies any acute issues. -Recommended heating pad as needed for acute pain -Continue to monitor, strict return precautions provided

## 2020-11-27 NOTE — Progress Notes (Addendum)
    SUBJECTIVE:   CHIEF COMPLAINT / HPI:   Back pain: Started after a fall on Wednesday. He slipped on some water in the floor and fell backwards and sideways onto his right side. He laid there for a second a girl helped him up. Did not call 911 or go to the hospital. He ended up going home. He felt a little numb the next day, took a couple of warm showers and his daily Aspirin and is feeling better today.   Diabetes: Patient presents for medication refill, needs medication refill of Tresiba, he continues to take 30 units daily.  His last A1c in December 2021 7.6%.  Recheck today, 7.8%.  Patient is also prescribed Metformin as well as Engineer, agricultural.  Patient does not recall if he takes either of these medications.  Hypertension: Patient's blood pressure today 150/80.  When asked if patient takes his Cozaar daily, he reports "I do not know what Cozaar is".  Patient denies headaches, vision changes, chest pain, shortness of breath.    PERTINENT  PMH / PSH:  T2DM, obesity, HTN, dementia  OBJECTIVE:   BP (!) 150/80   Pulse 92   Wt 263 lb 6 oz (119.5 kg)   SpO2 99%   BMI 38.89 kg/m    Physical exam: General: Well-appearing patient, no apparent distress Respiratory: CTA bilaterally, comfortable work of breathing Cardio: RRR, grade 2/6 systolic murmur appreciated, S2 present Back: Inspection: No gross deformity, scoliosis. Palpation: No midline or bony TTP. Range of motion: FROM  Strength: LEs 5/5 all muscle groups.   Reflexes: 2+ MSRs in patellar and achilles tendons, equal bilaterally. Negative SLRs. Sensation: Intact to light touch bilaterally. Negative logroll bilateral hips Negative fabers and piriformis stretches.   ASSESSMENT/PLAN:   Hypertension associated with diabetes ([place]) BP today 150/80, patient cannot recall what medication he takes for blood pressure, is unsure if he is taking it. -Asked patient to come back within the next month to have blood pressure rechecked and  medications reconciled  Type 2 diabetes mellitus with neurological complications (HCC) [id number] today 7.8%.  Patient prescribed metformin, Tresiba 30 units daily, and Engineer, agricultural.  Patient does not know if he is taking Metformin or Basaglar. -Asked patient to follow-up within the next month to have medications reconciled, asking him to bring all of his medicines to next visit.  Acute right-sided thoracic back pain Started after a fall that occurred 2-3 days ago.  Patient reports his symptoms are overall improved, denies any acute issues. -Recommended heating pad as needed for acute pain -Continue to monitor, strict return precautions provided     [place]

## 2020-11-27 NOTE — Assessment & Plan Note (Signed)
BP today 150/80, patient cannot recall what medication he takes for blood pressure, is unsure if he is taking it. -Asked patient to come back within the next month to have blood pressure rechecked and medications reconciled

## 2020-11-27 NOTE — Assessment & Plan Note (Signed)
HbA1c today 7.8%.  Patient prescribed metformin, Tresiba 30 units daily, and Engineer, agricultural.  Patient does not know if he is taking Metformin or Basaglar. -Asked patient to follow-up within the next month to have medications reconciled, asking him to bring all of his medicines to next visit.

## 2020-12-02 ENCOUNTER — Telehealth: Payer: Medicare HMO

## 2020-12-02 ENCOUNTER — Telehealth: Payer: Self-pay

## 2020-12-02 NOTE — Telephone Encounter (Signed)
  Care Management   Outreach Note  12/02/2020 Name: Charles Fox MRN: [id number] DOB: 03-11-1951  Referred by: [name], MD Reason for referral : Chronic Care Management (DM II)   An unsuccessful telephone outreach was attempted today. The patient was referred to the case management team for assistance with care management and care coordination.  Unable to leave a message.  Follow Up Plan: The care management team will reach out to the patient again over the next 7-14 days.   [name] RN, BSN, [facility] Care Management Coordinator [place] Phone: [phone] I Fax: [phone]

## 2020-12-04 NOTE — Progress Notes (Shared)
[facility] Note  12/08/2020     CHIEF COMPLAINT Patient presents for No chief complaint on file.   HISTORY OF PRESENT ILLNESS: Charles Fox is a 70 y.o. male who presents to the clinic today for:     Referring physician: [name], MD [address],  [place] [address]  HISTORICAL INFORMATION:   Selected notes from the MEDICAL RECORD NUMBER Referred by Dr. Zenia Resides   CURRENT MEDICATIONS: Current Outpatient Medications (Ophthalmic Drugs)  Medication Sig  . dorzolamide-timolol (COSOPT) 22.3-6.8 MG/ML ophthalmic solution Place 1 drop into both eyes 4 (four) times daily.   No current facility-administered medications for this visit. (Ophthalmic Drugs)   Current Outpatient Medications (Other)  Medication Sig  . Accu-Chek Softclix Lancets lancets Use as instructed  . Alcohol Swabs PADS He is to check blood glucose 2-3 times a day  . aspirin EC 81 MG tablet Take 1 tablet (81 mg total) by mouth daily. Swallow whole.  [name] atorvastatin (LIPITOR) 40 MG tablet Take 1 tablet (40 mg total) by mouth daily.  . blood glucose meter kit and supplies KIT Dispense based on patient and insurance preference. Use up to four times daily as directed. (FOR ICD-9 250.00, 250.01).  . Blood Glucose Monitoring Suppl (ACCU-CHEK AVIVA PLUS) w/Device KIT Check blood glucose before in AM while fasting.  [name] glucose blood (ACCU-CHEK AVIVA PLUS) test strip Use as instructed  . insulin degludec (TRESIBA FLEXTOUCH) 100 UNIT/ML FlexTouch Pen Inject 30 Units into the skin daily before breakfast.  . Insulin Glargine (BASAGLAR KWIKPEN) 100 UNIT/ML   . Insulin Pen Needle (UNIFINE PENTIPS) 31G X 5 MM MISC USE TO INJECT YOUR INSULIN ONCE DAILY  . Insulin Syringe-Needle U-100 (INSULIN SYRINGE .5CC/30GX1/2") 30G X 1/2" 0.5 ML MISC Use with glargine. Dispose after each use.  . losartan (COZAAR) 50 MG tablet Take 1 tablet (50 mg total) by mouth at bedtime.  . metFORMIN  (GLUCOPHAGE-XR) 500 MG 24 hr tablet Take 2 tablets (1,000 mg total) by mouth daily with breakfast.   No current facility-administered medications for this visit. (Other)      REVIEW OF SYSTEMS:    ALLERGIES No Known Allergies  PAST MEDICAL HISTORY Past Medical History:  Diagnosis Date  . Cataract    Mixed form OU  . Diabetes mellitus without complication ([place])   . Diabetic retinopathy ([place])    PDR OU  . Hypertension   . Hypertensive retinopathy    OU   Past Surgical History:  Procedure Laterality Date  . BACK SURGERY    . FINGER SURGERY      FAMILY HISTORY No family history on file.  SOCIAL HISTORY Social History   Tobacco Use  . Smoking status: Current Every Day Smoker    Packs/day: 0.50    Types: Cigarettes  . Smokeless tobacco: Never Used  Vaping Use  . Vaping Use: Never used  Substance Use Topics  . Alcohol use: No  . Drug use: Not Currently         OPHTHALMIC EXAM:  Not recorded     IMAGING AND PROCEDURES  Imaging and Procedures for 12/08/2020           ASSESSMENT/PLAN:  No diagnosis found.  1,2. Proliferative diabetic retinopathy w/ DME, OU (OS > OD) - initial exam showed +NVI OU, VH OU, +NVD OU, +NVE OU, scattered MA/IRH OU - FA (09.10.21) shows late-leaking MA, vascular nonperfusion, +NV -- will need PRP OU  - s/p  IVA OU #1 (09.10.21), #2 (10.14.21), #3 (3.7.21)  - s/p PRP OD (10.06.21)  **pt lost to f/u since 10.14.21 (5 mos)**  - BCVA OD 20/400 (down from 20/100); OS 20/40 (improved from 20/60)  - exam shows new VH OD  - OCT shows OD: Only radial scan obtained due to vit opacities; OS: +DME -- interval increase in IRF, mild improvement in vitreous opacities -  - pt wishes to proceed  - RBA of procedure discussed, questions answered  - Avastin informed consent obtained, signed and scanned, 09.10.21 - see procedure note - f/u 2 weeks, VH check, DFE, OCT, possible PRP OS  3,4. Hypertensive retinopathy OU - discussed  importance of tight BP control - monitor  5. Mixed Cataract OU - The symptoms of cataract, surgical options, and treatments and risks were discussed with patient. - discussed diagnosis and progression - under the expert management of Dr. Chauncey Cruel. [place]  6. Ocular Hypertension  - IOP today: 23,24  - start Cosopt BID OU   Ophthalmic Meds Ordered this visit:  No orders of the defined types were placed in this encounter.      No follow-ups on file.  There are no Patient Instructions on file for this visit.   Explained the diagnoses, plan, and follow up with the patient and they expressed understanding.  Patient expressed understanding of the importance of proper follow up care.   This document serves as a record of services personally performed by Gardiner Sleeper, MD, PhD. It was created on their behalf by Estill Bakes, COT an ophthalmic technician. The creation of this record is the provider's dictation and/or activities during the visit.    Electronically signed by: Estill Bakes, COT 3.18.22 @ 1:47 PM  Abbreviations: M myopia (nearsighted); A astigmatism; H hyperopia (farsighted); P presbyopia; Mrx spectacle prescription;  CTL contact lenses; OD right eye; OS left eye; OU both eyes  XT exotropia; ET esotropia; PEK punctate epithelial keratitis; PEE punctate epithelial erosions; DES dry eye syndrome; MGD meibomian gland dysfunction; ATs artificial tears; PFAT's preservative free artificial tears; [place] nuclear sclerotic cataract; PSC posterior subcapsular cataract; ERM epi-retinal membrane; PVD posterior vitreous detachment; RD retinal detachment; DM diabetes mellitus; DR diabetic retinopathy; NPDR non-proliferative diabetic retinopathy; PDR proliferative diabetic retinopathy; CSME clinically significant macular edema; DME diabetic macular edema; dbh dot blot hemorrhages; CWS cotton wool spot; POAG primary open angle glaucoma; C/D cup-to-disc ratio; HVF humphrey visual field; GVF goldmann  visual field; OCT optical coherence tomography; IOP intraocular pressure; BRVO Branch retinal vein occlusion; CRVO central retinal vein occlusion; CRAO central retinal artery occlusion; BRAO branch retinal artery occlusion; RT retinal tear; SB scleral buckle; PPV pars plana vitrectomy; VH Vitreous hemorrhage; PRP panretinal laser photocoagulation; IVK intravitreal kenalog; VMT vitreomacular traction; MH Macular hole;  NVD neovascularization of the disc; NVE neovascularization elsewhere; AREDS age related eye disease study; ARMD age related macular degeneration; POAG primary open angle glaucoma; EBMD epithelial/anterior basement membrane dystrophy; ACIOL anterior chamber intraocular lens; IOL intraocular lens; PCIOL posterior chamber intraocular lens; Phaco/IOL phacoemulsification with intraocular lens placement; [place] photorefractive keratectomy; LASIK laser assisted in situ keratomileusis; HTN hypertension; DM diabetes mellitus; COPD chronic obstructive pulmonary disease

## 2020-12-08 ENCOUNTER — Encounter (INDEPENDENT_AMBULATORY_CARE_PROVIDER_SITE_OTHER): Payer: Medicare HMO | Admitting: Ophthalmology

## 2020-12-08 DIAGNOSIS — H3581 Retinal edema: Secondary | ICD-10-CM

## 2020-12-08 DIAGNOSIS — H35033 Hypertensive retinopathy, bilateral: Secondary | ICD-10-CM

## 2020-12-08 DIAGNOSIS — I10 Essential (primary) hypertension: Secondary | ICD-10-CM

## 2020-12-08 DIAGNOSIS — H40053 Ocular hypertension, bilateral: Secondary | ICD-10-CM

## 2020-12-08 DIAGNOSIS — H25813 Combined forms of age-related cataract, bilateral: Secondary | ICD-10-CM

## 2020-12-08 DIAGNOSIS — E113513 Type 2 diabetes mellitus with proliferative diabetic retinopathy with macular edema, bilateral: Secondary | ICD-10-CM

## 2020-12-11 ENCOUNTER — Telehealth: Payer: Self-pay | Admitting: *Deleted

## 2020-12-11 NOTE — Chronic Care Management (AMB) (Signed)
  Care Management   Note  12/11/2020 Name: Charles Fox MRN: [id number] DOB: 1951-01-02  Charles Fox is a 70 y.o. year old male who is a primary care patient of [name], MD and is actively engaged with the care management team. I reached out to [name] by phone today to assist with re-scheduling a follow up visit with the RN Case Manager  Follow up plan: Unsuccessful telephone outreach attempt made. The care management team will reach out to the patient again over the next 7 days. If patient returns call to provider office, please advise to call [place] at [phone].  [place], [place] [address]

## 2020-12-17 NOTE — Chronic Care Management (AMB) (Signed)
  Care Management   Note  12/17/2020 Name: Charles Fox MRN: [id number] DOB: 1951-08-20  Charles Fox is a 70 y.o. year old male who is a primary care patient of [name], MD and is actively engaged with the care management team. I reached out to [name] by phone today to assist with re-scheduling a follow up visit with the RN Case Manager  Follow up plan: A second unsuccessful telephone outreach attempt made. The care management team will reach out to the patient again over the next 7 days. If patient returns call to provider office, please advise to call [place] at [phone].  [place] Management

## 2020-12-24 NOTE — Chronic Care Management (AMB) (Signed)
  Care Management   Note  12/24/2020 Name: Charles Fox MRN: [id number] DOB: 1950-10-13  Charles Fox is a 70 y.o. year old male who is a primary care patient of [name], MD and is actively engaged with the care management team. I reached out to [name] by phone today to assist with re-scheduling a follow up visit with the RN Case Manager  Follow up plan: Unsuccessful telephone outreach attempt made. Unable to make contact on outreach attempts x 3. PCP Dr. Jeannine Kitten.  notified via routed documentation in medical record. We have been unable to make contact with the patient for follow up. The care management team is available to follow up with the patient after provider conversation with the patient regarding recommendation for care management engagement and subsequent re-referral to the care management team. If patient returns call to provider office, please advise to call [place] at [phone].  [place] Management

## 2021-01-22 ENCOUNTER — Other Ambulatory Visit: Payer: Self-pay

## 2021-01-22 ENCOUNTER — Emergency Department (HOSPITAL_COMMUNITY)
Admission: EM | Admit: 2021-01-22 | Discharge: 2021-01-23 | Disposition: A | Discharge status: Home / Self Care | Payer: Medicare HMO | Attending: Student | Admitting: Student

## 2021-01-22 ENCOUNTER — Encounter (HOSPITAL_COMMUNITY): Payer: Self-pay | Admitting: Emergency Medicine

## 2021-01-22 DIAGNOSIS — R531 Weakness: Secondary | ICD-10-CM | POA: Insufficient documentation

## 2021-01-22 DIAGNOSIS — Z5321 Procedure and treatment not carried out due to patient leaving prior to being seen by health care provider: Secondary | ICD-10-CM | POA: Diagnosis not present

## 2021-01-22 LAB — CBC WITH DIFFERENTIAL/PLATELET
Abs Immature Granulocytes: 0.04 10*3/uL (ref 0.00–0.07)
Basophils Absolute: 0.1 10*3/uL (ref 0.0–0.1)
Basophils Relative: 1 %
Eosinophils Absolute: 0.4 10*3/uL (ref 0.0–0.5)
Eosinophils Relative: 4 %
HCT: 44.7 % (ref 39.0–52.0)
Hemoglobin: 14.7 g/dL (ref 13.0–17.0)
Immature Granulocytes: 1 %
Lymphocytes Relative: 26 %
Lymphs Abs: 2.3 10*3/uL (ref 0.7–4.0)
MCH: 29.7 pg (ref 26.0–34.0)
MCHC: 32.9 g/dL (ref 30.0–36.0)
MCV: 90.3 fL (ref 80.0–100.0)
Monocytes Absolute: 0.7 10*3/uL (ref 0.1–1.0)
Monocytes Relative: 8 %
Neutro Abs: 5.3 10*3/uL (ref 1.7–7.7)
Neutrophils Relative %: 60 %
Platelets: 282 10*3/uL (ref 150–400)
RBC: 4.95 MIL/uL (ref 4.22–5.81)
RDW: 13.3 % (ref 11.5–15.5)
WBC: 8.8 10*3/uL (ref 4.0–10.5)
nRBC: 0 % (ref 0.0–0.2)

## 2021-01-22 LAB — URINALYSIS, ROUTINE W REFLEX MICROSCOPIC
Bacteria, UA: NONE SEEN
Bilirubin Urine: NEGATIVE
Glucose, UA: >=500 mg/dL — AB
Hgb urine dipstick: NEGATIVE
Ketones, ur: NEGATIVE mg/dL
Leukocytes,Ua: NEGATIVE
Nitrite: NEGATIVE
Protein, ur: 30 mg/dL — AB
Specific Gravity, Urine: 1.016 (ref 1.005–1.030)
pH: 5 (ref 5.0–8.0)

## 2021-01-22 LAB — BASIC METABOLIC PANEL
Anion gap: 9 (ref 5–15)
BUN: 12 mg/dL (ref 8–23)
CO2: 21 mmol/L — ABNORMAL LOW (ref 22–32)
Calcium: 8.3 mg/dL — ABNORMAL LOW (ref 8.9–10.3)
Chloride: 98 mmol/L (ref 98–111)
Creatinine, Ser: 0.88 mg/dL (ref 0.61–1.24)
GFR, Estimated: >60 mL/min (ref >60)
Glucose, Bld: 237 mg/dL — ABNORMAL HIGH (ref 70–99)
Potassium: 3.9 mmol/L (ref 3.5–5.1)
Sodium: 128 mmol/L — ABNORMAL LOW (ref 135–145)

## 2021-01-22 LAB — CBG MONITORING, ED: Glucose-Capillary: 261 mg/dL — ABNORMAL HIGH (ref 70–99)

## 2021-01-22 NOTE — ED Provider Notes (Addendum)
Emergency Medicine Provider Triage Evaluation Note  Charles Fox , a 70 y.o. male  was evaluated in triage.  Pt complains of generalized weakness.  Patient reports he ran out of his insulin last night but has not had it take today.  Have a full dose to take last night, but used when he had left.  Has not checked his blood sugar at home.  Was going to try and make it till the morning but started feeling generally weak and woozy.  Denies any chest pain or shortness of breath.  No abdominal pain.  Was able to eat dinner.  Review of Systems  Positive: Weakness Negative: Chest pain, shortness of breath, abdominal pain, fever  Physical Exam  BP (!) 150/82 (BP Location: Left Arm)   Pulse 85   Temp 98.3 F (36.8 C)   Resp 14   Ht 5\' 8"  (1.727 m)   Wt 125.6 kg   SpO2 99%   BMI 42.12 kg/m  Gen:   Awake, no distress  Resp:  Normal effort  MSK:   Moves extremities without difficulty   Medical Decision Making  Medically screening exam initiated at 9:44 PM.  Appropriate orders placed.  Charles Fox was informed that the remainder of the evaluation will be completed by another provider, this initial triage assessment does not replace that evaluation, and the importance of remaining in the ED until their evaluation is complete.         Charles Fox 01/22/21 2150    Carmin Muskrat, MD 01/22/21 2154

## 2021-01-22 NOTE — ED Triage Notes (Signed)
Patient ran out of insulin today , reports feeling weak with fatigue today .

## 2021-01-23 NOTE — ED Notes (Signed)
Pt stated he was leaving, LWBS

## 2021-01-25 ENCOUNTER — Other Ambulatory Visit (HOSPITAL_COMMUNITY): Payer: Self-pay

## 2021-01-25 MED FILL — Insulin Pen Needle 31 G X 5 MM (1/5" or 3/16"): 90 days supply | Qty: 100 | Fill #0 | Status: AC

## 2021-01-26 ENCOUNTER — Other Ambulatory Visit (HOSPITAL_COMMUNITY): Payer: Self-pay

## 2021-01-26 MED FILL — Insulin Degludec Soln Pen-Injector 100 Unit/ML: SUBCUTANEOUS | 30 days supply | Qty: 9 | Fill #0 | Status: AC

## 2021-02-19 ENCOUNTER — Other Ambulatory Visit (HOSPITAL_COMMUNITY): Payer: Self-pay

## 2021-02-19 MED FILL — Insulin Degludec Soln Pen-Injector 100 Unit/ML: SUBCUTANEOUS | 30 days supply | Qty: 9 | Fill #1 | Status: AC

## 2021-02-23 ENCOUNTER — Telehealth: Payer: Self-pay | Admitting: Family Medicine

## 2021-02-23 ENCOUNTER — Other Ambulatory Visit: Payer: Self-pay | Admitting: Family Medicine

## 2021-02-23 ENCOUNTER — Other Ambulatory Visit (HOSPITAL_COMMUNITY): Payer: Self-pay

## 2021-02-23 NOTE — Progress Notes (Deleted)
   Subjective:   Patient ID: Charles Fox    DOB: 1951/04/19, 70 y.o. male   MRN: [id number]  ADIT RIDDLES is a 70 y.o. male with a history of HTN, HLD, T2DM, dementia, h/o thoracic back pain, chronic pain of both shoulders, food insecurity, h/o lacunar CVA, impaired vision, morbid obesity, dental pain tobacco use disorder here for abdominal pain  HPI:  ABDOMINAL PAIN  Pain began *** days ago Medications tried: *** Similar pain before:*** Prior abdominal surgeries: ***  Symptoms Nausea/vomiting: *** Diarrhea: *** Constipation: *** Blood in stool: *** Blood in vomit: *** Fever: *** Dysuria: *** Loss of appetite: *** Weight loss: ***  Vaginal Bleeding: *** Missed menstrual period: ***  Review of Symptoms - see HPI PMH - Smoking status noted.      Review of Systems:  Per HPI.   Objective:   There were no vitals taken for this visit. Vitals and nursing note reviewed.  General: pleasant ***, sitting comfortably in exam chair, well nourished, well developed, in no acute distress with non-toxic appearance HEENT: normocephalic, atraumatic, moist mucous membranes, oropharynx clear without erythema or exudate, TM normal bilaterally  Neck: supple, non-tender without lymphadenopathy CV: regular rate and rhythm without murmurs, rubs, or gallops, no lower extremity edema, 2+ radial and pedal pulses bilaterally Lungs: clear to auscultation bilaterally with normal work of breathing on room air Resp: breathing comfortably on room air, speaking in full sentences Abdomen: soft, non-tender, non-distended, no masses or organomegaly palpable, normoactive bowel sounds Skin: warm, dry, no rashes or lesions Extremities: warm and well perfused, normal tone MSK: ROM grossly intact, strength intact, gait normal Neuro: Alert and oriented, speech normal  Assessment & Plan:   No problem-specific Assessment & Plan notes found for this encounter.  No orders of the defined types were  placed in this encounter.  No orders of the defined types were placed in this encounter.   {    This will disappear when note is signed, click to select method of visit    :1}  Mina Marble, DO PGY-3, [facility] 02/23/2021 1:06 PM

## 2021-02-23 NOTE — Telephone Encounter (Signed)
Pt walked in stating he went to the pharmacy to pick up medication (insulin pen)  And the pharmacy said they did not have a prescription for him.  He want to know if he can get a prescription refill.  Pt said he feels weak. Only taking Metformin and Asprin.     Pls call Ph # [phone]

## 2021-02-24 ENCOUNTER — Other Ambulatory Visit (HOSPITAL_COMMUNITY): Payer: Self-pay

## 2021-02-24 ENCOUNTER — Ambulatory Visit: Payer: Medicare HMO

## 2021-02-24 MED ORDER — TRESIBA FLEXTOUCH 100 UNIT/ML ~~LOC~~ SOPN
30.0000 [IU] | PEN_INJECTOR | Freq: Every day | SUBCUTANEOUS | 11 refills | Status: DC
  Filled 2021-02-24: qty 9, 30d supply, fill #0
  Filled 2021-04-08: qty 9, 30d supply, fill #1
  Filled 2021-04-26 – 2021-05-04 (×2): qty 9, 30d supply, fill #2

## 2021-02-25 ENCOUNTER — Ambulatory Visit (INDEPENDENT_AMBULATORY_CARE_PROVIDER_SITE_OTHER): Payer: Medicare HMO | Admitting: Family Medicine

## 2021-02-25 ENCOUNTER — Ambulatory Visit (HOSPITAL_COMMUNITY)
Admission: RE | Admit: 2021-02-25 | Discharge: 2021-02-25 | Disposition: A | Discharge status: Home / Self Care | Payer: Medicare HMO | Source: Ambulatory Visit | Attending: Family Medicine | Admitting: Family Medicine

## 2021-02-25 ENCOUNTER — Other Ambulatory Visit (HOSPITAL_COMMUNITY): Payer: Self-pay

## 2021-02-25 ENCOUNTER — Other Ambulatory Visit: Payer: Self-pay | Admitting: Family Medicine

## 2021-02-25 ENCOUNTER — Other Ambulatory Visit: Payer: Self-pay

## 2021-02-25 VITALS — BP 158/86 | HR 85 | Ht 68.0 in | Wt 260.6 lb

## 2021-02-25 DIAGNOSIS — E1149 Type 2 diabetes mellitus with other diabetic neurological complication: Secondary | ICD-10-CM | POA: Diagnosis not present

## 2021-02-25 DIAGNOSIS — R079 Chest pain, unspecified: Secondary | ICD-10-CM | POA: Diagnosis not present

## 2021-02-25 LAB — POCT GLYCOSYLATED HEMOGLOBIN (HGB A1C): HbA1c, POC (controlled diabetic range): 9.8 % — AB (ref 0.0–7.0)

## 2021-02-25 MED ORDER — NITROGLYCERIN 0.3 MG SL SUBL
0.3000 mg | SUBLINGUAL_TABLET | SUBLINGUAL | 0 refills | Status: DC | PRN
  Filled 2021-02-25: qty 30, 25d supply, fill #0

## 2021-02-25 MED ORDER — CARVEDILOL 3.125 MG PO TABS
3.1250 mg | ORAL_TABLET | Freq: Two times a day (BID) | ORAL | 3 refills | Status: DC
  Filled 2021-02-25: qty 180, 90d supply, fill #0

## 2021-02-25 MED ORDER — NITROGLYCERIN 0.4 MG SL SUBL
0.4000 mg | SUBLINGUAL_TABLET | SUBLINGUAL | 0 refills | Status: DC | PRN
  Filled 2021-02-25: qty 25, 5d supply, fill #0

## 2021-02-25 NOTE — Progress Notes (Signed)
    SUBJECTIVE:   CHIEF COMPLAINT / HPI:   Epigastric pain  Present x 1 week. First noticed it when he was watching TV. Pain lasts for about 5-10 minutes. Pain is described as pressure, dull. Also comes when he is walking around, but resolves with sitting down in a chair. During pain episodes, no SOB, no radiation, diaphoresis, dizziness. Not associated with eating. Denies dysphagia, nausea, vomiting. No new exercises of the upper extremity.  Patient reports that he sleeps in a recliner. He reports that he is scared that he is going to die if he lies back completely. Thinks he might have some SOB with lying back.   Does report a fall backwards after slipping on water a week ago.  Patient is somewhat of a poor historian. Some of the details of his history are not consistent and he is very tangential.  A1C is 9.8 (up from 7.8)   Medications: metformin, losartan, atorvastatin, ASA  PERTINENT  PMH / PSH: HTN, DM w/ neurological complications, HLD, dementia, obesity,  No hx of GERD.   OBJECTIVE:   BP (!) 158/86   Pulse 85   Ht 5\' 8"  (1.727 m)   Wt 260 lb 9.6 oz (118.2 kg)   SpO2 96%   BMI 39.62 kg/m   General: Well-appearing, no acute distress Chest: No tenderness to palpation of anterior chest wall.  Regular rate and rhythm. Lungs: No crackles or wheezing appreciated. Abdomen: Obese, nontender, nondistended. Lower extremities: 1+ BLEE Neuro: A & O x2, not to year (says its 2015). No FND.   ASSESSMENT/PLAN:   Chest pain DDX: GERD, gastroparesis in setting of poorly controlled diabetes.  Biggest concern for ACS given his many risk factors as well as symptoms consistent with stable angina.  Additionally, patient has symptoms concerning for possible heart failure which include bilateral lower extremity edema, dyspnea on exertion, and sleeps on a recliner.  EKG today showed NSR, LVH, with no significant changes from last tracing.  Will obtain labs, echocardiogram, chest x-ray and start  patient on Coreg 3.125 mg twice daily.  Patient's weight does not look elevated, will not start on Lasix.  We also will urgently refer him to cardiology.  Strict return precautions to the emergency department provided.  Patient is understanding with no further questions at this time.  Type 2 diabetes mellitus with neurological complications ([place]) Given acute issues today, was not able to address diabetes.  Patient will be called to schedule follow-up for diabetes as A1c has increased from 9.8 from 7.8 3 months ago.   Wilber Oliphant, MD [place]

## 2021-02-25 NOTE — Patient Instructions (Addendum)
I am concerned that there is a cardiac cause of your chest pain.  I would like to start a work-up with some labs, chest x-ray, echocardiogram.  Please see below for these appointment times.

## 2021-02-26 ENCOUNTER — Encounter: Payer: Self-pay | Admitting: Family Medicine

## 2021-02-26 DIAGNOSIS — R079 Chest pain, unspecified: Secondary | ICD-10-CM | POA: Insufficient documentation

## 2021-02-26 HISTORY — DX: Chest pain, unspecified: R07.9

## 2021-02-26 LAB — CBC
Hematocrit: 44.6 % (ref 37.5–51.0)
Hemoglobin: 15.4 g/dL (ref 13.0–17.7)
MCH: 29.6 pg (ref 26.6–33.0)
MCHC: 34.5 g/dL (ref 31.5–35.7)
MCV: 86 fL (ref 79–97)
Platelets: 304 10*3/uL (ref 150–450)
RBC: 5.21 x10E6/uL (ref 4.14–5.80)
RDW: 12 % (ref 11.6–15.4)
WBC: 7.7 10*3/uL (ref 3.4–10.8)

## 2021-02-26 LAB — BASIC METABOLIC PANEL
BUN/Creatinine Ratio: 12 (ref 10–24)
BUN: 10 mg/dL (ref 8–27)
CO2: 22 mmol/L (ref 20–29)
Calcium: 9.3 mg/dL (ref 8.6–10.2)
Chloride: 101 mmol/L (ref 96–106)
Creatinine, Ser: 0.81 mg/dL (ref 0.76–1.27)
Glucose: 235 mg/dL — ABNORMAL HIGH (ref 65–99)
Potassium: 4.5 mmol/L (ref 3.5–5.2)
Sodium: 137 mmol/L (ref 134–144)
eGFR: 97 mL/min/{1.73_m2} (ref >59)

## 2021-02-26 LAB — BRAIN NATRIURETIC PEPTIDE: BNP: 23.9 pg/mL (ref 0.0–100.0)

## 2021-02-26 NOTE — Assessment & Plan Note (Signed)
Given acute issues today, was not able to address diabetes.  Patient will be called to schedule follow-up for diabetes as A1c has increased from 9.8 from 7.8 3 months ago.

## 2021-02-26 NOTE — Assessment & Plan Note (Signed)
DDX: GERD, gastroparesis in setting of poorly controlled diabetes.  Biggest concern for ACS given his many risk factors as well as symptoms consistent with stable angina.  Additionally, patient has symptoms concerning for possible heart failure which include bilateral lower extremity edema, dyspnea on exertion, and sleeps on a recliner.  EKG today showed NSR, LVH, with no significant changes from last tracing.  Will obtain labs, echocardiogram, chest x-ray and start patient on Coreg 3.125 mg twice daily.  Patient's weight does not look elevated, will not start on Lasix.  We also will urgently refer him to cardiology.  Strict return precautions to the emergency department provided.  Patient is understanding with no further questions at this time.

## 2021-03-01 ENCOUNTER — Encounter: Payer: Self-pay | Admitting: Family Medicine

## 2021-03-10 ENCOUNTER — Ambulatory Visit (HOSPITAL_COMMUNITY): Admission: RE | Admit: 2021-03-10 | Payer: Medicare HMO | Source: Ambulatory Visit

## 2021-03-15 ENCOUNTER — Telehealth (HOSPITAL_COMMUNITY): Payer: Self-pay | Admitting: Family Medicine

## 2021-03-15 ENCOUNTER — Encounter (HOSPITAL_COMMUNITY): Payer: Self-pay | Admitting: Family Medicine

## 2021-03-15 NOTE — Telephone Encounter (Signed)
Just an FYI. We have made several attempts to contact this patient including sending a letter to schedule or reschedule their echocardiogram. We will be removing the patient from the echo WQ.   03/10/21 NO SHOWED-MAILED LETTER [facility]      Thank you

## 2021-03-16 ENCOUNTER — Encounter: Payer: Self-pay | Admitting: Family Medicine

## 2021-03-17 NOTE — Telephone Encounter (Signed)
We have had difficulty contacting him via phone on many occasions in the past.  He can discuss it at his next appointment with his pcp.

## 2021-04-03 ENCOUNTER — Emergency Department (HOSPITAL_COMMUNITY)
Admission: EM | Admit: 2021-04-03 | Discharge: 2021-04-03 | Disposition: A | Discharge status: Home / Self Care | Payer: Medicare HMO | Attending: Emergency Medicine | Admitting: Emergency Medicine

## 2021-04-03 ENCOUNTER — Encounter (HOSPITAL_COMMUNITY): Payer: Self-pay | Admitting: Emergency Medicine

## 2021-04-03 ENCOUNTER — Other Ambulatory Visit: Payer: Self-pay

## 2021-04-03 DIAGNOSIS — F17210 Nicotine dependence, cigarettes, uncomplicated: Secondary | ICD-10-CM | POA: Diagnosis not present

## 2021-04-03 DIAGNOSIS — Z8616 Personal history of COVID-19: Secondary | ICD-10-CM | POA: Diagnosis not present

## 2021-04-03 DIAGNOSIS — Z79899 Other long term (current) drug therapy: Secondary | ICD-10-CM | POA: Diagnosis not present

## 2021-04-03 DIAGNOSIS — I10 Essential (primary) hypertension: Secondary | ICD-10-CM | POA: Diagnosis not present

## 2021-04-03 DIAGNOSIS — Z7982 Long term (current) use of aspirin: Secondary | ICD-10-CM | POA: Insufficient documentation

## 2021-04-03 DIAGNOSIS — F0390 Unspecified dementia without behavioral disturbance: Secondary | ICD-10-CM | POA: Diagnosis not present

## 2021-04-03 DIAGNOSIS — Z794 Long term (current) use of insulin: Secondary | ICD-10-CM | POA: Insufficient documentation

## 2021-04-03 DIAGNOSIS — R079 Chest pain, unspecified: Secondary | ICD-10-CM | POA: Diagnosis not present

## 2021-04-03 DIAGNOSIS — E1149 Type 2 diabetes mellitus with other diabetic neurological complication: Secondary | ICD-10-CM | POA: Diagnosis not present

## 2021-04-03 DIAGNOSIS — R55 Syncope and collapse: Secondary | ICD-10-CM | POA: Diagnosis not present

## 2021-04-03 DIAGNOSIS — Z7984 Long term (current) use of oral hypoglycemic drugs: Secondary | ICD-10-CM | POA: Diagnosis not present

## 2021-04-03 DIAGNOSIS — R0789 Other chest pain: Secondary | ICD-10-CM | POA: Diagnosis not present

## 2021-04-03 LAB — TROPONIN I (HIGH SENSITIVITY)
Troponin I (High Sensitivity): 9 ng/L (ref <18)
Troponin I (High Sensitivity): 9 ng/L (ref <18)

## 2021-04-03 LAB — CBC WITH DIFFERENTIAL/PLATELET
Abs Immature Granulocytes: 0.05 10*3/uL (ref 0.00–0.07)
Basophils Absolute: 0.1 10*3/uL (ref 0.0–0.1)
Basophils Relative: 1 %
Eosinophils Absolute: 0.3 10*3/uL (ref 0.0–0.5)
Eosinophils Relative: 3 %
HCT: 47.4 % (ref 39.0–52.0)
Hemoglobin: 15.3 g/dL (ref 13.0–17.0)
Immature Granulocytes: 1 %
Lymphocytes Relative: 14 %
Lymphs Abs: 1.5 10*3/uL (ref 0.7–4.0)
MCH: 29.5 pg (ref 26.0–34.0)
MCHC: 32.3 g/dL (ref 30.0–36.0)
MCV: 91.5 fL (ref 80.0–100.0)
Monocytes Absolute: 1.1 10*3/uL — ABNORMAL HIGH (ref 0.1–1.0)
Monocytes Relative: 10 %
Neutro Abs: 7.5 10*3/uL (ref 1.7–7.7)
Neutrophils Relative %: 71 %
Platelets: 283 10*3/uL (ref 150–400)
RBC: 5.18 MIL/uL (ref 4.22–5.81)
RDW: 13.2 % (ref 11.5–15.5)
WBC: 10.5 10*3/uL (ref 4.0–10.5)
nRBC: 0 % (ref 0.0–0.2)

## 2021-04-03 LAB — BASIC METABOLIC PANEL
Anion gap: 6 (ref 5–15)
BUN: 8 mg/dL (ref 8–23)
CO2: 24 mmol/L (ref 22–32)
Calcium: 8.9 mg/dL (ref 8.9–10.3)
Chloride: 106 mmol/L (ref 98–111)
Creatinine, Ser: 0.83 mg/dL (ref 0.61–1.24)
GFR, Estimated: >60 mL/min (ref >60)
Glucose, Bld: 150 mg/dL — ABNORMAL HIGH (ref 70–99)
Potassium: 4 mmol/L (ref 3.5–5.1)
Sodium: 136 mmol/L (ref 135–145)

## 2021-04-03 MED ORDER — ASPIRIN 81 MG PO CHEW
324.0000 mg | CHEWABLE_TABLET | Freq: Once | ORAL | Status: AC
  Administered 2021-04-03: 324 mg via ORAL
  Filled 2021-04-03: qty 4

## 2021-04-03 NOTE — Discharge Instructions (Addendum)
Your evaluation today has been largely reassuring.  But, it is important that you monitor your condition carefully, and do not hesitate to return to the ED if you develop new, or concerning changes in your condition. ° °Otherwise, please follow-up with your physician for appropriate ongoing care. ° °

## 2021-04-03 NOTE — ED Notes (Signed)
Med given 

## 2021-04-03 NOTE — ED Notes (Signed)
The pt has had an abscess on his scrotum for one year  No difference in it tonight

## 2021-04-03 NOTE — ED Triage Notes (Signed)
Patient reports skin abscess under his scrotum onset today with no drainage . Denies fever or chills .

## 2021-04-03 NOTE — ED Provider Notes (Addendum)
[facility] EMERGENCY DEPARTMENT Provider Note   CSN: [id number] Arrival date & time: 04/03/21  0154     History Chief Complaint  Patient presents with   Abscess    Charles Fox is a 70 y.o. male.  The history is provided by the patient.  He has history of hypertension, diabetes, hyperlipidemia and came to the emergency department because he had an episode of feeling weak and lightheaded like he was going to pass out.  There was some associated chest pain which she is unable to characterize.  He is not having any chest pain currently.  He denies nausea, vomiting, diaphoresis.  Triage note states scrotal abscess.  He tells me that the abscess has been there for over a year and has not changed and is not really the reason that he came to the emergency department.  Of note, patient is a very poor historian and is very difficult to pry relevant information from him.   Past Medical History:  Diagnosis Date   Cataract    Mixed form OU   Diabetes mellitus without complication ([place])    Diabetic retinopathy ([place])    PDR OU   Hypertension    Hypertensive retinopathy    OU    Patient Active Problem List   Diagnosis Date Noted   Chest pain 02/26/2021   Acute right-sided thoracic back pain 11/27/2020   History of lacunar cerebrovascular accident 06/11/2020   Dementia without behavioral disturbance ([place]) 05/27/2020   Impaired vision 05/27/2020   Pain, dental 11/20/2019   High priority for COVID-19 virus vaccination 11/20/2019   Chronic pain of both shoulders 09/10/2019   Tendinopathy of left rotator cuff 04/29/2019   Food insecurity 07/09/2018   Skin lesions, generalized 05/09/2018   Hyperlipidemia associated with type 2 diabetes mellitus ([place]) 12/22/2016   Urine test positive for microalbuminuria 05/27/2016   Type 2 diabetes mellitus with neurological complications ([place]) 49/17/9150   Morbid obesity ([place]) 09/04/2014   Hypertension associated with diabetes ([place])  09/04/2014   Tobacco use disorder 09/04/2014    Past Surgical History:  Procedure Laterality Date   BACK SURGERY     FINGER SURGERY         No family history on file.  Social History   Tobacco Use   Smoking status: Every Day    Packs/day: 0.50    Types: Cigarettes   Smokeless tobacco: Never  Vaping Use   Vaping Use: Never used  Substance Use Topics   Alcohol use: No   Drug use: Not Currently    Home Medications Prior to Admission medications   Medication Sig Start Date End Date Taking? Authorizing Provider  Accu-Chek Softclix Lancets lancets Use as instructed 07/08/20   [name], MD  Alcohol Swabs PADS He is to check blood glucose 2-3 times a day 12/04/17   Mercy Riding, MD  aspirin EC 81 MG tablet Take 1 tablet (81 mg total) by mouth daily. Swallow whole. 10/14/20   [name], MD  atorvastatin (LIPITOR) 40 MG tablet Take 1 tablet (40 mg total) by mouth daily. 10/14/20   [name], MD  Blood Glucose Monitoring Suppl (ACCU-CHEK AVIVA PLUS) w/Device KIT Check blood glucose before in AM while fasting. 07/08/20   [name], MD  carvedilol (COREG) 3.125 MG tablet Take 1 tablet (3.125 mg total) by mouth 2 (two) times daily with a meal. 02/25/21   Wilber Oliphant, MD  dorzolamide-timolol (COSOPT) 22.3-6.8 MG/ML ophthalmic solution Place  1 drop into both eyes 4 (four) times daily. 07/02/20 07/02/21  Bernarda Caffey, MD  glucose blood (ACCU-CHEK AVIVA PLUS) test strip Use as instructed 07/08/20   [name], MD  insulin degludec (TRESIBA FLEXTOUCH) 100 UNIT/ML FlexTouch Pen INJECT 30 UNITS INTO THE SKIN DAILY. 02/24/21   [name], MD  Insulin Pen Needle 31G X 5 MM MISC USE TO INJECT YOUR INSULIN ONCE DAILY 11/27/20 11/27/21  [name], MD  losartan (COZAAR) 50 MG tablet Take 1 tablet (50 mg total) by mouth at bedtime. 11/27/20 11/22/21  Daisy Floro, DO  metFORMIN (GLUCOPHAGE-XR) 500 MG 24 hr tablet Take 2 tablets (1,000 mg total) by mouth daily with  breakfast. 10/14/20   [name], MD  nitroGLYCERIN (NITROSTAT) 0.4 MG SL tablet Place 1 tablet (0.4 mg total) under the tongue every 5 (five) minutes as needed for chest pain. 02/25/21   Wilber Oliphant, MD    Allergies    Patient has no known allergies.  Review of Systems   Review of Systems  All other systems reviewed and are negative.  Physical Exam Updated Vital Signs BP (!) 150/77 (BP Location: Right Arm)   Pulse 84   Temp 98.4 F (36.9 C) (Oral)   Resp (!) 24   SpO2 98%   Physical Exam Vitals and nursing note reviewed.  70 year old male, resting comfortably and in no acute distress. Vital signs are significant for elevated blood pressure and respiratory rate. Oxygen saturation is 98%, which is normal. Head is normocephalic and atraumatic. PERRLA, EOMI. Oropharynx is clear. Neck is nontender and supple without adenopathy or JVD. Back is nontender and there is no CVA tenderness. Lungs are clear without rales, wheezes, or rhonchi. Chest is nontender. Heart has regular rate and rhythm without murmur. Abdomen is soft, flat, nontender without masses or hepatosplenomegaly and peristalsis is normoactive. Genitalia: Circumcised penis.  Testes descended without masses.  No scrotal lesions seen.  Perineal area is normal without swelling or induration or tenderness. Extremities have 1+ edema, full range of motion is present. Skin is warm and dry without rash. Neurologic: Mental status is normal, cranial nerves are intact, moves all extremities equally.  ED Results / Procedures / Treatments   Labs (all labs ordered are listed, but only abnormal results are displayed) Labs Reviewed  BASIC METABOLIC PANEL - Abnormal; Notable for the following components:      Result Value   Glucose, Bld 150 (*)    All other components within normal limits  CBC WITH DIFFERENTIAL/PLATELET - Abnormal; Notable for the following components:   Monocytes Absolute 1.1 (*)    All other components within  normal limits  TROPONIN I (HIGH SENSITIVITY)  TROPONIN I (HIGH SENSITIVITY)    EKG EKG Interpretation  Date/Time:  Saturday April 03 2021 06:10:29 EDT Ventricular Rate:  78 PR Interval:  174 QRS Duration: 95 QT Interval:  361 QTC Calculation: 412 R Axis:   -25 Text Interpretation: Sinus rhythm Borderline left axis deviation Abnormal R-wave progression, late transition Borderline ST elevation, lateral leads When compared with ECG of 02/25/2021, No significant change was found Confirmed by Delora Fuel ([id number]) on 04/03/2021 6:26:23 AM  Procedures Procedures   Medications Ordered in ED Medications  aspirin chewable tablet 324 mg (324 mg Oral Given 04/03/21 0092)    ED Course  I have reviewed the triage vital signs and the nursing notes.  Pertinent lab results that were available during my care of the patient were reviewed  by me and considered in my medical decision making (see chart for details).   MDM Rules/Calculators/A&P                         Chest pain with near syncopal episode.  Will evaluate for possible ACS.  ECG, troponin are ordered.  Old records are reviewed, and he does have a prior ED visit for dizziness.  Initial troponin is normal as are WBC and basic metabolic panel.  Delta troponin is pending.  Case is signed out to Dr. Vanita Panda.  Final Clinical Impression(s) / ED Diagnoses Final diagnoses:  Chest pain, unspecified type    Rx / DC Orders ED Discharge Orders     None        Delora Fuel, MD 44/97/53 0051    Delora Fuel, MD 07/09/10 [phone]

## 2021-04-08 ENCOUNTER — Other Ambulatory Visit (HOSPITAL_COMMUNITY): Payer: Self-pay

## 2021-04-13 ENCOUNTER — Other Ambulatory Visit: Payer: Self-pay | Admitting: Family Medicine

## 2021-04-13 ENCOUNTER — Other Ambulatory Visit (HOSPITAL_COMMUNITY): Payer: Self-pay

## 2021-04-13 ENCOUNTER — Telehealth: Payer: Self-pay | Admitting: Pharmacist

## 2021-04-13 DIAGNOSIS — Z599 Problem related to housing and economic circumstances, unspecified: Secondary | ICD-10-CM

## 2021-04-13 DIAGNOSIS — Z5900 Homelessness unspecified: Secondary | ICD-10-CM

## 2021-04-13 NOTE — Telephone Encounter (Signed)
Patient presented to clinic stating he could not locate his insulin Charles Fox). [place] states patient picked up prescription 5 days ago so they are unable to fill again. Medication Samples have been provided to the patient.  Drug name: insulin degludec Charles Fox)       Strength: 100 units/mL        Qty: 3 pens  LOT: [id number]  Exp.Date: 3/23  Dosing instructions: Inject 30 units into skin once daily  The patient has been instructed regarding the correct time, dose, and frequency of taking this medication, including desired effects and most common side effects.   [place] Better 11:19 AM 04/13/2021   Patient also states that he currently does not have a place to live. Will route to PCP to put in appropriate referral.

## 2021-04-13 NOTE — Progress Notes (Signed)
Patient reports he is homeless. Care coordination referral for housing resources.  Charles Essex, MD

## 2021-04-14 ENCOUNTER — Ambulatory Visit: Payer: Self-pay | Admitting: Licensed Clinical Social Worker

## 2021-04-14 ENCOUNTER — Telehealth: Payer: Self-pay | Admitting: *Deleted

## 2021-04-14 DIAGNOSIS — Z7689 Persons encountering health services in other specified circumstances: Secondary | ICD-10-CM

## 2021-04-14 NOTE — Chronic Care Management (AMB) (Signed)
  Care Management  Consultation Note  04/14/2021 Name: FAHD ANTCZAK MRN: [id number] DOB: 02-12-1951  DARRELL EMRICH is a 70 y.o. year old male who is a primary care patient of [name], MD. The CCM team was consulted reference care coordination needs for [employer]  and [employer].  Assessment: Patient was not interviewed or contacted during this encounter.    Intervention:Conducted brief assessment, recommendations and relevant information discussed. CCM LCSW collaborated with CCM care guide and CCM RN .    Follow up Plan: CCM care guide will call patient and schedule phone appointment.    Review of patient past medical history, allergies, medications, and health status, including review of pertinent consultant reports was performed as part of comprehensive evaluation and provision of care management/care coordination services.    [name], [place] / [place]   [phone] 9:38 AM

## 2021-04-14 NOTE — Chronic Care Management (AMB) (Signed)
  Care Management   Outreach Note  04/14/2021 Name: Charles Fox MRN: [id number] DOB: Feb 05, 1951  Referred by: [name], MD Reason for referral : Care Coordination (Initial outreach to schedule referral with Licensed Clinical SW )   An unsuccessful telephone outreach was attempted today. The patient was referred to the case management team for assistance with care management and care coordination.   Follow Up Plan: The care management team will reach out to the patient again over the next 1 days.  If patient returns call to provider office, please advise to call [place]  at [phone].  [place] [facility]  Direct Dial: [phone]

## 2021-04-19 NOTE — Chronic Care Management (AMB) (Signed)
  Care Management   Outreach Note  04/19/2021 Name: Charles Fox MRN: [id number] DOB: [birth year]  Referred by: [name], MD Reason for referral : Care Coordination (Initial outreach to schedule referral with Licensed Clinical SW )   A second unsuccessful telephone outreach was attempted today. The patient was referred to the case management team for assistance with care management and care coordination.   Follow Up Plan: The care management team will reach out to the patient again over the next 7 days.  If patient returns call to provider office, please advise to call [place] at [phone].  [place] Management  Direct Dial: [phone]

## 2021-04-23 NOTE — Chronic Care Management (AMB) (Signed)
  Care Management   Outreach Note  04/23/2021 Name: Charles Fox MRN: [id number] DOB: 03-03-1951  Referred by: [name], MD Reason for referral : Care Coordination (Initial outreach to schedule referral with Licensed Clinical SW )   Third unsuccessful telephone outreach was attempted today. The patient was referred to the case management team for assistance with care management and care coordination. The patient's primary care provider has been notified of our unsuccessful attempts to make or maintain contact with the patient. The care management team is pleased to engage with this patient at any time in the future should he/she be interested in assistance from the care management team.   Follow Up Plan: We have been unable to make contact with the patient. The care management team is available to follow up with the patient after provider conversation with the patient regarding recommendation for care management engagement and subsequent re-referral to the care management team.   [place] Management  Direct Dial: [phone]

## 2021-04-26 ENCOUNTER — Other Ambulatory Visit (HOSPITAL_COMMUNITY): Payer: Self-pay

## 2021-05-01 ENCOUNTER — Other Ambulatory Visit: Payer: Self-pay

## 2021-05-01 ENCOUNTER — Emergency Department (HOSPITAL_COMMUNITY)
Admission: EM | Admit: 2021-05-01 | Discharge: 2021-05-01 | Disposition: A | Discharge status: Home / Self Care | Payer: Medicare HMO | Attending: Emergency Medicine | Admitting: Emergency Medicine

## 2021-05-01 ENCOUNTER — Encounter (HOSPITAL_COMMUNITY): Payer: Self-pay | Admitting: Emergency Medicine

## 2021-05-01 DIAGNOSIS — E119 Type 2 diabetes mellitus without complications: Secondary | ICD-10-CM | POA: Diagnosis not present

## 2021-05-01 DIAGNOSIS — F17210 Nicotine dependence, cigarettes, uncomplicated: Secondary | ICD-10-CM | POA: Diagnosis not present

## 2021-05-01 DIAGNOSIS — Z833 Family history of diabetes mellitus: Secondary | ICD-10-CM | POA: Diagnosis not present

## 2021-05-01 DIAGNOSIS — Z20822 Contact with and (suspected) exposure to covid-19: Secondary | ICD-10-CM | POA: Diagnosis not present

## 2021-05-01 DIAGNOSIS — Z794 Long term (current) use of insulin: Secondary | ICD-10-CM | POA: Insufficient documentation

## 2021-05-01 DIAGNOSIS — R1084 Generalized abdominal pain: Secondary | ICD-10-CM | POA: Diagnosis not present

## 2021-05-01 DIAGNOSIS — Z7984 Long term (current) use of oral hypoglycemic drugs: Secondary | ICD-10-CM | POA: Diagnosis not present

## 2021-05-01 DIAGNOSIS — Z7982 Long term (current) use of aspirin: Secondary | ICD-10-CM | POA: Diagnosis not present

## 2021-05-01 DIAGNOSIS — R5383 Other fatigue: Secondary | ICD-10-CM | POA: Diagnosis present

## 2021-05-01 DIAGNOSIS — R0902 Hypoxemia: Secondary | ICD-10-CM | POA: Diagnosis not present

## 2021-05-01 DIAGNOSIS — Z8639 Personal history of other endocrine, nutritional and metabolic disease: Secondary | ICD-10-CM

## 2021-05-01 DIAGNOSIS — I10 Essential (primary) hypertension: Secondary | ICD-10-CM | POA: Diagnosis not present

## 2021-05-01 DIAGNOSIS — Z0000 Encounter for general adult medical examination without abnormal findings: Secondary | ICD-10-CM

## 2021-05-01 LAB — URINALYSIS, ROUTINE W REFLEX MICROSCOPIC
Bacteria, UA: NONE SEEN
Bilirubin Urine: NEGATIVE
Glucose, UA: NEGATIVE mg/dL
Ketones, ur: NEGATIVE mg/dL
Leukocytes,Ua: NEGATIVE
Nitrite: NEGATIVE
Protein, ur: 30 mg/dL — AB
Specific Gravity, Urine: 1.01 (ref 1.005–1.030)
pH: 7 (ref 5.0–8.0)

## 2021-05-01 LAB — RESP PANEL BY RT-PCR (FLU A&B, COVID) ARPGX2
Influenza A by PCR: NEGATIVE
Influenza B by PCR: NEGATIVE
SARS Coronavirus 2 by RT PCR: NEGATIVE

## 2021-05-01 LAB — COMPREHENSIVE METABOLIC PANEL
ALT: 17 U/L (ref 0–44)
AST: 19 U/L (ref 15–41)
Albumin: 3.5 g/dL (ref 3.5–5.0)
Alkaline Phosphatase: 110 U/L (ref 38–126)
Anion gap: 9 (ref 5–15)
BUN: 9 mg/dL (ref 8–23)
CO2: 21 mmol/L — ABNORMAL LOW (ref 22–32)
Calcium: 8.9 mg/dL (ref 8.9–10.3)
Chloride: 104 mmol/L (ref 98–111)
Creatinine, Ser: 0.73 mg/dL (ref 0.61–1.24)
GFR, Estimated: >60 mL/min (ref >60)
Glucose, Bld: 115 mg/dL — ABNORMAL HIGH (ref 70–99)
Potassium: 4.1 mmol/L (ref 3.5–5.1)
Sodium: 134 mmol/L — ABNORMAL LOW (ref 135–145)
Total Bilirubin: 0.9 mg/dL (ref 0.3–1.2)
Total Protein: 7.5 g/dL (ref 6.5–8.1)

## 2021-05-01 LAB — LIPASE, BLOOD: Lipase: 20 U/L (ref 11–51)

## 2021-05-01 NOTE — ED Provider Notes (Signed)
[facility] EMERGENCY DEPARTMENT Provider Note   CSN: [id number] Arrival date & time: 05/01/21  5056     History No chief complaint on file.   KIM LAUVER is a 70 y.o. male.  70 yo male with history as below presenting to the ED 2/2 complaint of "feeling tired." Pt reports that he did not eat breakfast this morning, checked his glucose at home and it was low. Pt denies initial triage complaint of abdominal pain or urinary retention. Reports the he is urinating well and does not have any abdominal pain nor did he prior to arrival. No N/V/D. No fevers or chills. He reports that he feels back to normal and he wants to go home. He is mentating appropriately. Ambulatory.   The history is provided by the patient. No language interpreter was used.      Past Medical History:  Diagnosis Date   Cataract    Mixed form OU   Diabetes mellitus without complication ([place])    Diabetic retinopathy ([place])    PDR OU   Hypertension    Hypertensive retinopathy    OU    Patient Active Problem List   Diagnosis Date Noted   Chest pain 02/26/2021   Acute right-sided thoracic back pain 11/27/2020   History of lacunar cerebrovascular accident 06/11/2020   Dementia without behavioral disturbance ([place]) 05/27/2020   Impaired vision 05/27/2020   Pain, dental 11/20/2019   High priority for COVID-19 virus vaccination 11/20/2019   Chronic pain of both shoulders 09/10/2019   Tendinopathy of left rotator cuff 04/29/2019   Food insecurity 07/09/2018   Skin lesions, generalized 05/09/2018   Hyperlipidemia associated with type 2 diabetes mellitus ([place]) 12/22/2016   Urine test positive for microalbuminuria 05/27/2016   Type 2 diabetes mellitus with neurological complications ([place]) 97/94/8016   Morbid obesity ([place]) 09/04/2014   Hypertension associated with diabetes ([place]) 09/04/2014   Tobacco use disorder 09/04/2014    Past Surgical History:  Procedure Laterality Date   BACK SURGERY      FINGER SURGERY         History reviewed. No pertinent family history.  Social History   Tobacco Use   Smoking status: Every Day    Packs/day: 0.50    Types: Cigarettes   Smokeless tobacco: Never  Vaping Use   Vaping Use: Never used  Substance Use Topics   Alcohol use: No   Drug use: Not Currently    Home Medications Prior to Admission medications   Medication Sig Start Date End Date Taking? Authorizing Provider  Accu-Chek Softclix Lancets lancets Use as instructed 07/08/20   [name], MD  Alcohol Swabs PADS He is to check blood glucose 2-3 times a day 12/04/17   Mercy Riding, MD  aspirin EC 81 MG tablet Take 1 tablet (81 mg total) by mouth daily. Swallow whole. 10/14/20   [name], MD  atorvastatin (LIPITOR) 40 MG tablet Take 1 tablet (40 mg total) by mouth daily. 10/14/20   [name], MD  Blood Glucose Monitoring Suppl (ACCU-CHEK AVIVA PLUS) w/Device KIT Check blood glucose before in AM while fasting. 07/08/20   [name], MD  carvedilol (COREG) 3.125 MG tablet Take 1 tablet (3.125 mg total) by mouth 2 (two) times daily with a meal. 02/25/21   Wilber Oliphant, MD  dorzolamide-timolol (COSOPT) 22.3-6.8 MG/ML ophthalmic solution Place 1 drop into both eyes 4 (four) times daily. 07/02/20 07/02/21  Bernarda Caffey, MD  glucose blood (ACCU-CHEK  AVIVA PLUS) test strip Use as instructed 07/08/20   [name], MD  insulin degludec (TRESIBA FLEXTOUCH) 100 UNIT/ML FlexTouch Pen INJECT 30 UNITS INTO THE SKIN DAILY. 02/24/21   [name], MD  Insulin Pen Needle 31G X 5 MM MISC USE TO INJECT YOUR INSULIN ONCE DAILY 11/27/20 11/27/21  [name], MD  losartan (COZAAR) 50 MG tablet Take 1 tablet (50 mg total) by mouth at bedtime. 11/27/20 11/22/21  Daisy Floro, DO  metFORMIN (GLUCOPHAGE-XR) 500 MG 24 hr tablet Take 2 tablets (1,000 mg total) by mouth daily with breakfast. 10/14/20   [name], MD  nitroGLYCERIN (NITROSTAT) 0.4 MG SL tablet Place 1  tablet (0.4 mg total) under the tongue every 5 (five) minutes as needed for chest pain. 02/25/21   Wilber Oliphant, MD    Allergies    Patient has no known allergies.  Review of Systems   Review of Systems  Constitutional:  Negative for chills and fever.  HENT:  Negative for facial swelling and trouble swallowing.   Eyes:  Negative for photophobia and visual disturbance.  Respiratory:  Negative for cough and shortness of breath.   Cardiovascular:  Negative for chest pain and palpitations.  Gastrointestinal:  Negative for abdominal pain, nausea and vomiting.  Endocrine: Negative for polydipsia and polyuria.  Genitourinary:  Negative for difficulty urinating and hematuria.  Musculoskeletal:  Negative for gait problem and joint swelling.  Skin:  Negative for pallor and rash.  Neurological:  Negative for syncope and headaches.  Psychiatric/Behavioral:  Negative for agitation and confusion.    Physical Exam Updated Vital Signs BP (!) 146/96   Pulse (!) 102   Temp 97.6 F (36.4 C) (Oral)   Resp 19   SpO2 98%   Physical Exam Vitals and nursing note reviewed.  Constitutional:      General: He is not in acute distress.    Appearance: He is well-developed.  HENT:     Head: Normocephalic and atraumatic.     Right Ear: External ear normal.     Left Ear: External ear normal.     Mouth/Throat:     Mouth: Mucous membranes are moist.     Comments: Poor dentition  No obvious abscess No oral pain Eyes:     General: No scleral icterus. Cardiovascular:     Rate and Rhythm: Regular rhythm. Tachycardia present.     Pulses: Normal pulses.     Heart sounds: Normal heart sounds.  Pulmonary:     Effort: Pulmonary effort is normal. No respiratory distress.     Breath sounds: Normal breath sounds.  Abdominal:     General: Abdomen is flat. Bowel sounds are normal.     Palpations: Abdomen is soft.     Tenderness: There is no abdominal tenderness. There is no right CVA tenderness or left CVA  tenderness.     Comments: Benign abdomen   Musculoskeletal:        General: Normal range of motion.     Cervical back: Normal range of motion.     Right lower leg: No edema.     Left lower leg: No edema.  Skin:    General: Skin is warm and dry.     Capillary Refill: Capillary refill takes less than 2 seconds.  Neurological:     Mental Status: He is alert and oriented to person, place, and time.  Psychiatric:        Mood and Affect: Mood normal.  Behavior: Behavior normal.    ED Results / Procedures / Treatments   Labs (all labs ordered are listed, but only abnormal results are displayed) Labs Reviewed  COMPREHENSIVE METABOLIC PANEL - Abnormal; Notable for the following components:      Result Value   Sodium 134 (*)    CO2 21 (*)    Glucose, Bld 115 (*)    All other components within normal limits  URINALYSIS, ROUTINE W REFLEX MICROSCOPIC - Abnormal; Notable for the following components:   Hgb urine dipstick SMALL (*)    Protein, ur 30 (*)    All other components within normal limits  RESP PANEL BY RT-PCR (FLU A&B, COVID) ARPGX2  LIPASE, BLOOD    EKG None  Radiology No results found.  Procedures Procedures   Medications Ordered in ED Medications - No data to display  ED Course  I have reviewed the triage vital signs and the nursing notes.  Pertinent labs & imaging results that were available during my care of the patient were reviewed by me and considered in my medical decision making (see chart for details).    MDM Rules/Calculators/A&P                           This is 70 yo male presenting with complaint of feeling tired/not eating breakfast. He has no acute complaints currently, he is asking to go home. He does not want any further evaluation in the ER. He does not want labs or imaging done.   Patient ambulating without difficulty. Reports that he feels normal and doesn't want to be here anymore. He is asking for a bus pass or a taxi fare and to go  home. TOC consulted.    I believe this patient is of sound mind and medical decision making capacity to refuse medical care. The patient is responding and asking questions appropriately. The patient is oriented to person, place and time. The patient is not psychotic,delusional, suicidal, homicidal or hallucinating. The patient demonstrates a normal mental capacity to make decisions regarding their healthcare. The patient is clinically sober and does not appear to be under the influence of any illicit drugs at this time. Alternatives have been offered - the patient remains steadfast in their wish to leave. The patient has been advised that should they change their mind they are welcome to return to this hospital, or any other, at any time. Detailed discussions were had with the patient regarding current findings, and need for close f/u with PCP or on call doctor. Patient verbalized understanding.     Final Clinical Impression(s) / ED Diagnoses Final diagnoses:  Physical exam  History of diabetes mellitus    Rx / DC Orders ED Discharge Orders     None        Jeanell Sparrow, DO 05/01/21 2042

## 2021-05-01 NOTE — ED Notes (Signed)
Social work at bedside.  

## 2021-05-01 NOTE — ED Notes (Signed)
Pt d/c home per MD order. Discharge summary reviewed with pt, social work to arrange discharge transportation. Ambulatory off unit. No s/s of acute distress noted .

## 2021-05-01 NOTE — Progress Notes (Signed)
CSW received consult for transportation assistance. CSW met with patient and introduced herself and role. Patient presented in a safe but strange manner. Patient arrived to [facility] via EMS from a [place] [phone]. [place] [place]). Patient was medically cleared for discharge but went back and forth on whether or not he was near a bus stop. Patient reported several times living in different parts of [facility] and wanting to go with different relatives. Patient notably kept leaving the conversation to go to the restroom several times. Patient reported his truck was at the [place] and agreed to [place] reaching out to attain address to send him via uber back. CSW contacted EMS and was given that address which lines up with patient's report. CSW informed patient and noted he consented and signed the rider waiver form. CSW advised patient to wait outside and Melburn Popper will let the lobby staff know via phone call when ride is on the way and that if he misses his ride he will have to use the bus system.   Alfredia Ferguson, LCSW, [facility] Emergency Department [facility]: [phone]

## 2021-05-01 NOTE — ED Triage Notes (Signed)
Pt to triage via [facility] from a gas station.  Reports lower abd pain, dysuria, and urinary retention x 2 hours.  18g L forearm.  Fentanyl 100 mcg given by EMS PTA.  Pt incontinent of urine in triage floor on arrival.  CBG 95.

## 2021-05-03 ENCOUNTER — Ambulatory Visit: Payer: Medicare HMO

## 2021-05-03 ENCOUNTER — Emergency Department (HOSPITAL_COMMUNITY)
Admission: EM | Admit: 2021-05-03 | Discharge: 2021-05-03 | Disposition: A | Discharge status: Home / Self Care | Payer: Medicare HMO | Attending: Emergency Medicine | Admitting: Emergency Medicine

## 2021-05-03 ENCOUNTER — Emergency Department (HOSPITAL_COMMUNITY): Payer: Medicare HMO

## 2021-05-03 ENCOUNTER — Other Ambulatory Visit: Payer: Self-pay

## 2021-05-03 DIAGNOSIS — Z7982 Long term (current) use of aspirin: Secondary | ICD-10-CM | POA: Insufficient documentation

## 2021-05-03 DIAGNOSIS — Z20822 Contact with and (suspected) exposure to covid-19: Secondary | ICD-10-CM | POA: Diagnosis not present

## 2021-05-03 DIAGNOSIS — N3289 Other specified disorders of bladder: Secondary | ICD-10-CM | POA: Diagnosis not present

## 2021-05-03 DIAGNOSIS — F0390 Unspecified dementia without behavioral disturbance: Secondary | ICD-10-CM | POA: Diagnosis not present

## 2021-05-03 DIAGNOSIS — J9811 Atelectasis: Secondary | ICD-10-CM | POA: Diagnosis not present

## 2021-05-03 DIAGNOSIS — N1330 Unspecified hydronephrosis: Secondary | ICD-10-CM | POA: Insufficient documentation

## 2021-05-03 DIAGNOSIS — E1136 Type 2 diabetes mellitus with diabetic cataract: Secondary | ICD-10-CM | POA: Diagnosis not present

## 2021-05-03 DIAGNOSIS — E1129 Type 2 diabetes mellitus with other diabetic kidney complication: Secondary | ICD-10-CM | POA: Diagnosis not present

## 2021-05-03 DIAGNOSIS — N179 Acute kidney failure, unspecified: Secondary | ICD-10-CM | POA: Insufficient documentation

## 2021-05-03 DIAGNOSIS — E11319 Type 2 diabetes mellitus with unspecified diabetic retinopathy without macular edema: Secondary | ICD-10-CM | POA: Diagnosis not present

## 2021-05-03 DIAGNOSIS — Z7984 Long term (current) use of oral hypoglycemic drugs: Secondary | ICD-10-CM | POA: Diagnosis not present

## 2021-05-03 DIAGNOSIS — E1169 Type 2 diabetes mellitus with other specified complication: Secondary | ICD-10-CM | POA: Diagnosis not present

## 2021-05-03 DIAGNOSIS — Z794 Long term (current) use of insulin: Secondary | ICD-10-CM | POA: Diagnosis not present

## 2021-05-03 DIAGNOSIS — R339 Retention of urine, unspecified: Secondary | ICD-10-CM | POA: Diagnosis not present

## 2021-05-03 DIAGNOSIS — E785 Hyperlipidemia, unspecified: Secondary | ICD-10-CM | POA: Diagnosis not present

## 2021-05-03 DIAGNOSIS — E1149 Type 2 diabetes mellitus with other diabetic neurological complication: Secondary | ICD-10-CM | POA: Diagnosis not present

## 2021-05-03 DIAGNOSIS — R0602 Shortness of breath: Secondary | ICD-10-CM | POA: Diagnosis not present

## 2021-05-03 DIAGNOSIS — Z79899 Other long term (current) drug therapy: Secondary | ICD-10-CM | POA: Diagnosis not present

## 2021-05-03 DIAGNOSIS — R231 Pallor: Secondary | ICD-10-CM | POA: Diagnosis not present

## 2021-05-03 DIAGNOSIS — J80 Acute respiratory distress syndrome: Secondary | ICD-10-CM | POA: Diagnosis not present

## 2021-05-03 DIAGNOSIS — Z28310 Unvaccinated for covid-19: Secondary | ICD-10-CM | POA: Insufficient documentation

## 2021-05-03 DIAGNOSIS — R079 Chest pain, unspecified: Secondary | ICD-10-CM | POA: Diagnosis not present

## 2021-05-03 DIAGNOSIS — K5790 Diverticulosis of intestine, part unspecified, without perforation or abscess without bleeding: Secondary | ICD-10-CM | POA: Diagnosis not present

## 2021-05-03 DIAGNOSIS — R300 Dysuria: Secondary | ICD-10-CM | POA: Diagnosis present

## 2021-05-03 DIAGNOSIS — R0789 Other chest pain: Secondary | ICD-10-CM | POA: Diagnosis not present

## 2021-05-03 DIAGNOSIS — F17210 Nicotine dependence, cigarettes, uncomplicated: Secondary | ICD-10-CM | POA: Insufficient documentation

## 2021-05-03 DIAGNOSIS — R000 Tachycardia, unspecified: Secondary | ICD-10-CM | POA: Diagnosis not present

## 2021-05-03 DIAGNOSIS — I700 Atherosclerosis of aorta: Secondary | ICD-10-CM | POA: Diagnosis not present

## 2021-05-03 DIAGNOSIS — R42 Dizziness and giddiness: Secondary | ICD-10-CM | POA: Diagnosis not present

## 2021-05-03 LAB — CBC WITH DIFFERENTIAL/PLATELET
Abs Immature Granulocytes: 0.1 10*3/uL — ABNORMAL HIGH (ref 0.00–0.07)
Basophils Absolute: 0 10*3/uL (ref 0.0–0.1)
Basophils Relative: 0 %
Eosinophils Absolute: 0 10*3/uL (ref 0.0–0.5)
Eosinophils Relative: 0 %
HCT: 52.5 % — ABNORMAL HIGH (ref 39.0–52.0)
Hemoglobin: 17.4 g/dL — ABNORMAL HIGH (ref 13.0–17.0)
Immature Granulocytes: 1 %
Lymphocytes Relative: 5 %
Lymphs Abs: 1 10*3/uL (ref 0.7–4.0)
MCH: 29.2 pg (ref 26.0–34.0)
MCHC: 33.1 g/dL (ref 30.0–36.0)
MCV: 88.2 fL (ref 80.0–100.0)
Monocytes Absolute: 1.7 10*3/uL — ABNORMAL HIGH (ref 0.1–1.0)
Monocytes Relative: 9 %
Neutro Abs: 15.9 10*3/uL — ABNORMAL HIGH (ref 1.7–7.7)
Neutrophils Relative %: 85 %
Platelets: 314 10*3/uL (ref 150–400)
RBC: 5.95 MIL/uL — ABNORMAL HIGH (ref 4.22–5.81)
RDW: 13.8 % (ref 11.5–15.5)
WBC: 18.6 10*3/uL — ABNORMAL HIGH (ref 4.0–10.5)
nRBC: 0 % (ref 0.0–0.2)

## 2021-05-03 LAB — URINALYSIS, ROUTINE W REFLEX MICROSCOPIC
Bacteria, UA: NONE SEEN
Bilirubin Urine: NEGATIVE
Glucose, UA: >=500 mg/dL — AB
Ketones, ur: 5 mg/dL — AB
Leukocytes,Ua: NEGATIVE
Nitrite: NEGATIVE
Protein, ur: 100 mg/dL — AB
Specific Gravity, Urine: 1.015 (ref 1.005–1.030)
pH: 5 (ref 5.0–8.0)

## 2021-05-03 LAB — COMPREHENSIVE METABOLIC PANEL
ALT: 20 U/L (ref 0–44)
AST: 29 U/L (ref 15–41)
Albumin: 3.6 g/dL (ref 3.5–5.0)
Alkaline Phosphatase: 130 U/L — ABNORMAL HIGH (ref 38–126)
Anion gap: 12 (ref 5–15)
BUN: 25 mg/dL — ABNORMAL HIGH (ref 8–23)
CO2: 23 mmol/L (ref 22–32)
Calcium: 9.5 mg/dL (ref 8.9–10.3)
Chloride: 99 mmol/L (ref 98–111)
Creatinine, Ser: 1.51 mg/dL — ABNORMAL HIGH (ref 0.61–1.24)
GFR, Estimated: 50 mL/min — ABNORMAL LOW (ref >60)
Glucose, Bld: 259 mg/dL — ABNORMAL HIGH (ref 70–99)
Potassium: 4.1 mmol/L (ref 3.5–5.1)
Sodium: 134 mmol/L — ABNORMAL LOW (ref 135–145)
Total Bilirubin: 1.4 mg/dL — ABNORMAL HIGH (ref 0.3–1.2)
Total Protein: 8.1 g/dL (ref 6.5–8.1)

## 2021-05-03 LAB — RESP PANEL BY RT-PCR (FLU A&B, COVID) ARPGX2
Influenza A by PCR: NEGATIVE
Influenza B by PCR: NEGATIVE
SARS Coronavirus 2 by RT PCR: NEGATIVE

## 2021-05-03 LAB — TROPONIN I (HIGH SENSITIVITY)
Troponin I (High Sensitivity): 40 ng/L — ABNORMAL HIGH (ref <18)
Troponin I (High Sensitivity): 34 ng/L — ABNORMAL HIGH (ref <18)

## 2021-05-03 LAB — LIPASE, BLOOD: Lipase: 19 U/L (ref 11–51)

## 2021-05-03 MED ORDER — LACTATED RINGERS IV BOLUS
1000.0000 mL | Freq: Once | INTRAVENOUS | Status: AC
  Administered 2021-05-03: 1000 mL via INTRAVENOUS

## 2021-05-03 NOTE — Social Work (Signed)
CSW consulted for homelessness issues. CSW met with Pt at bedside. Pt states that he was told by his brother-in-law that he is unwelcome at the home where he has been living with his son and grandsons on bil's property.  Pt reports that he himself owns land in [place].  Pt further reports that he receives SSI and that he has money to stay at a motel for a short time.  CSW provided Pt with shelter resources as well as [employer]. Pt states that he is familiar with [place] offices on [facility] and will access that office for further assistance.  Pt reports that he was brought via ambulance to ED from a [facility], CSW was able to locate particular [facility] through EMS runsheet.  Pt states that his truck is still parked at [employer] and that is a working vehicle. CSW located car keys with Pt's belongings.  Taxi voucher will be supplied to transport Pt to [place] to pick up truck, Pt states he will go to see his cousin, [name], who resides "[address]" to ask if he can stay with him for a few days. Sack lunch was also provided.

## 2021-05-03 NOTE — ED Provider Notes (Signed)
Emergency Medicine Provider Triage Evaluation Note  Charles Fox , a 70 y.o. male  was evaluated in triage.  Pt complains of epigastric/substernal chest pain that began 2-3 days ago. Pt also complains of burning with urination. Per EMS pt was seen 2 days ago for same. Pt states he "just haven't gotten better." He does not have a PCP to follow up with. He told EMS his sugar was low however CBG on arrival 240.  Review of Systems  Positive: + chest pain, dysuria Negative: - SOB, nausea, vomiting  Physical Exam  BP (!) 180/107   Pulse (!) 117   Temp 98.1 F (36.7 C) (Oral)   Resp 14   SpO2 98%  Gen:   Awake, no distress   Resp:  Normal effort  MSK:   Moves extremities without difficulty  Other:  No epigastric or chest wall TTP  Medical Decision Making  Medically screening exam initiated at 8:15 AM.  Appropriate orders placed.  Charles Fox was informed that the remainder of the evaluation will be completed by another provider, this initial triage assessment does not replace that evaluation, and the importance of remaining in the ED until their evaluation is complete.     Charles Maize, PA-C 05/03/21 [id number]    Charles Belling, MD 05/03/21 [phone]

## 2021-05-03 NOTE — ED Notes (Signed)
Applied foley leg bag. Educated pt on how to empty bag.

## 2021-05-03 NOTE — Discharge Instructions (Addendum)
Below is the contact information for [facility].  Please give them a call tomorrow and schedule an appointment for reevaluation.  Please come back to the emergency department immediately if you develop any new or worsening symptoms.  It was a pleasure to meet you.

## 2021-05-03 NOTE — ED Provider Notes (Signed)
I provided a substantive portion of the care of this patient.  I personally performed the entirety of the medical decision making for this encounter.  EKG Interpretation  Date/Time:  Monday May 03 2021 16:51:49 EDT Ventricular Rate:  118 PR Interval:  159 QRS Duration: 91 QT Interval:  323 QTC Calculation: 453 R Axis:   -75 Text Interpretation: Sinus tachycardia Probable left atrial enlargement Left anterior fascicular block Probable anteroseptal infarct, old Confirmed by Lacretia Leigh [phone]) on 05/03/2021 5:3:59 PM   70 year old male presents with increased suprapubic pain.  No flank discomfort.  States he has had some dysuria.  Has chronic shortness of breath which is on change.  Has leukocytosis on CBC.  Will check bladder scan and perform CT renal and reassess   Lacretia Leigh, MD 05/03/21 1731

## 2021-05-03 NOTE — Progress Notes (Deleted)
    SUBJECTIVE:   CHIEF COMPLAINT / HPI:   Care coordination referral recently placed for homelessness; however, multiple outreach attempts from LCSW unsuccessful. Recent clinic visit for chest pain. He was started on carvedilol and echo was ordered but was a no show. Last month seen in the ED for chest pain, workup with ECG and troponins were unremarkable.  T2DM On Tresiba 30 units daily and metformin 1000 mg daily.  PERTINENT  PMH / PSH: T2DM, HTN, HLD, dementia, obesity, tobacco use, CVA  OBJECTIVE:   There were no vitals taken for this visit.  General: ***, NAD CV: RRR, no murmurs*** Pulm: CTAB, no wheezes or rales  ASSESSMENT/PLAN:   No problem-specific Assessment & Plan notes found for this encounter.     [name], MD [place]   {    This will disappear when note is signed, click to select method of visit    :1}

## 2021-05-03 NOTE — ED Notes (Signed)
Pt pulse ox was not properly on. Reattached the new O2 sat is 97%

## 2021-05-03 NOTE — ED Triage Notes (Signed)
Pt via EMS with multiple complaints, chiefly being hungry. Endorses dull CP x 2 days, generally not feeling well, burning with urination, and his blood sugar being low; however CBG 240. Pt urinated on triage floor on arrival.

## 2021-05-03 NOTE — ED Notes (Signed)
Provided pt with taxi voucher and blue scrub pants.

## 2021-05-03 NOTE — ED Provider Notes (Signed)
[place]  EMERGENCY DEPARTMENT Provider Note   CSN: [id number] Arrival date & time: 05/03/21  0757     History No chief complaint on file.   Charles Fox is a 70 y.o. male.  HPI Patient is a 70 year old male with a history of hypertension, diabetes mellitus, who presents to the emergency department due to dysuria.  Patient states that he has been experiencing dysuria for the past 2 to 3 days.  Denies any penile discharge.  Also reports a decreased appetite.  In triage he noted epigastric pain as well as chest pain but denies this to me.  Patient appears to be a very poor historian and it is difficult to obtain clear history from him.  He denies any nausea, vomiting, diarrhea.  States he has not been vaccinated for COVID-19.    Past Medical History:  Diagnosis Date   Cataract    Mixed form OU   Diabetes mellitus without complication ([place])    Diabetic retinopathy ([place])    PDR OU   Hypertension    Hypertensive retinopathy    OU    Patient Active Problem List   Diagnosis Date Noted   Chest pain 02/26/2021   Acute right-sided thoracic back pain 11/27/2020   History of lacunar cerebrovascular accident 06/11/2020   Dementia without behavioral disturbance ([place]) 05/27/2020   Impaired vision 05/27/2020   Pain, dental 11/20/2019   High priority for COVID-19 virus vaccination 11/20/2019   Chronic pain of both shoulders 09/10/2019   Tendinopathy of left rotator cuff 04/29/2019   Food insecurity 07/09/2018   Skin lesions, generalized 05/09/2018   Hyperlipidemia associated with type 2 diabetes mellitus ([place]) 12/22/2016   Urine test positive for microalbuminuria 05/27/2016   Type 2 diabetes mellitus with neurological complications ([place]) 56/38/7564   Morbid obesity ([place]) 09/04/2014   Hypertension associated with diabetes ([place]) 09/04/2014   Tobacco use disorder 09/04/2014    Past Surgical History:  Procedure Laterality Date   BACK SURGERY     FINGER SURGERY          No family history on file.  Social History   Tobacco Use   Smoking status: Every Day    Packs/day: 0.50    Types: Cigarettes   Smokeless tobacco: Never  Vaping Use   Vaping Use: Never used  Substance Use Topics   Alcohol use: No   Drug use: Not Currently    Home Medications Prior to Admission medications   Medication Sig Start Date End Date Taking? Authorizing Provider  Accu-Chek Softclix Lancets lancets Use as instructed 07/08/20   [name], MD  Alcohol Swabs PADS He is to check blood glucose 2-3 times a day 12/04/17   Mercy Riding, MD  aspirin EC 81 MG tablet Take 1 tablet (81 mg total) by mouth daily. Swallow whole. 10/14/20   [name], MD  atorvastatin (LIPITOR) 40 MG tablet Take 1 tablet (40 mg total) by mouth daily. 10/14/20   [name], MD  Blood Glucose Monitoring Suppl (ACCU-CHEK AVIVA PLUS) w/Device KIT Check blood glucose before in AM while fasting. 07/08/20   [name], MD  carvedilol (COREG) 3.125 MG tablet Take 1 tablet (3.125 mg total) by mouth 2 (two) times daily with a meal. 02/25/21   Wilber Oliphant, MD  dorzolamide-timolol (COSOPT) 22.3-6.8 MG/ML ophthalmic solution Place 1 drop into both eyes 4 (four) times daily. 07/02/20 07/02/21  Bernarda Caffey, MD  glucose blood (ACCU-CHEK AVIVA PLUS) test strip Use as  instructed 07/08/20   [name], MD  insulin degludec (TRESIBA FLEXTOUCH) 100 UNIT/ML FlexTouch Pen INJECT 30 UNITS INTO THE SKIN DAILY. 02/24/21   [name], MD  Insulin Pen Needle 31G X 5 MM MISC USE TO INJECT YOUR INSULIN ONCE DAILY 11/27/20 11/27/21  [name], MD  losartan (COZAAR) 50 MG tablet Take 1 tablet (50 mg total) by mouth at bedtime. 11/27/20 11/22/21  Daisy Floro, DO  metFORMIN (GLUCOPHAGE-XR) 500 MG 24 hr tablet Take 2 tablets (1,000 mg total) by mouth daily with breakfast. 10/14/20   [name], MD  nitroGLYCERIN (NITROSTAT) 0.4 MG SL tablet Place 1 tablet (0.4 mg total) under the tongue every  5 (five) minutes as needed for chest pain. 02/25/21   Wilber Oliphant, MD    Allergies    Patient has no known allergies.  Review of Systems   Review of Systems  All other systems reviewed and are negative. Ten systems reviewed and are negative for acute change, except as noted in the HPI.   Physical Exam Updated Vital Signs BP (!) 154/108   Pulse (!) 116   Temp 97.8 F (36.6 C) (Oral)   Resp 20   SpO2 93%   Physical Exam Vitals and nursing note reviewed.  Constitutional:      General: He is not in acute distress.    Appearance: Normal appearance. He is not ill-appearing, toxic-appearing or diaphoretic.  HENT:     Head: Normocephalic and atraumatic.     Right Ear: External ear normal.     Left Ear: External ear normal.     Nose: Nose normal.     Mouth/Throat:     Mouth: Mucous membranes are moist.     Pharynx: Oropharynx is clear. No oropharyngeal exudate or posterior oropharyngeal erythema.  Eyes:     General:        Right eye: No discharge.        Left eye: No discharge.     Extraocular Movements: Extraocular movements intact.     Conjunctiva/sclera: Conjunctivae normal.  Cardiovascular:     Rate and Rhythm: Regular rhythm. Tachycardia present.     Pulses: Normal pulses.     Heart sounds: Normal heart sounds. No murmur heard.   No friction rub. No gallop.     Comments: Tachycardic.  No murmurs, rubs, or gallops. Pulmonary:     Effort: Pulmonary effort is normal. No respiratory distress.     Breath sounds: Normal breath sounds. No stridor. No wheezing, rhonchi or rales.     Comments: Lungs are clear to auscultation bilaterally. Abdominal:     General: Abdomen is flat.     Palpations: Abdomen is soft.     Tenderness: There is abdominal tenderness.     Comments: Protuberant abdomen that is soft.  Mild to moderate tenderness noted along the suprapubic region.  Musculoskeletal:        General: Normal range of motion.     Cervical back: Normal range of motion and neck  supple. No tenderness.  Skin:    General: Skin is warm and dry.  Neurological:     General: No focal deficit present.     Mental Status: He is alert and oriented to person, place, and time.  Psychiatric:        Mood and Affect: Mood normal.        Behavior: Behavior normal.   ED Results / Procedures / Treatments   Labs (all labs ordered are  listed, but only abnormal results are displayed) Labs Reviewed  COMPREHENSIVE METABOLIC PANEL - Abnormal; Notable for the following components:      Result Value   Sodium 134 (*)    Glucose, Bld 259 (*)    BUN 25 (*)    Creatinine, Ser 1.51 (*)    Alkaline Phosphatase 130 (*)    Total Bilirubin 1.4 (*)    GFR, Estimated 50 (*)    All other components within normal limits  CBC WITH DIFFERENTIAL/PLATELET - Abnormal; Notable for the following components:   WBC 18.6 (*)    RBC 5.95 (*)    Hemoglobin 17.4 (*)    HCT 52.5 (*)    Neutro Abs 15.9 (*)    Monocytes Absolute 1.7 (*)    Abs Immature Granulocytes 0.10 (*)    All other components within normal limits  URINALYSIS, ROUTINE W REFLEX MICROSCOPIC - Abnormal; Notable for the following components:   APPearance HAZY (*)    Glucose, UA >=500 (*)    Hgb urine dipstick SMALL (*)    Ketones, ur 5 (*)    Protein, ur 100 (*)    All other components within normal limits  TROPONIN I (HIGH SENSITIVITY) - Abnormal; Notable for the following components:   Troponin I (High Sensitivity) 40 (*)    All other components within normal limits  TROPONIN I (HIGH SENSITIVITY) - Abnormal; Notable for the following components:   Troponin I (High Sensitivity) 34 (*)    All other components within normal limits  RESP PANEL BY RT-PCR (FLU A&B, COVID) [id number]  URINE CULTURE  LIPASE, BLOOD   EKG EKG Interpretation  Date/Time:  Monday May 03 2021 16:51:49 EDT Ventricular Rate:  118 PR Interval:  159 QRS Duration: 91 QT Interval:  323 QTC Calculation: 453 R Axis:   -75 Text Interpretation: Sinus  tachycardia Probable left atrial enlargement Left anterior fascicular block Probable anteroseptal infarct, old Confirmed by Lacretia Leigh ([id number]) on 05/03/2021 5:12:50 PM  Radiology DG Chest 2 View  Result Date: 05/03/2021 CLINICAL DATA:  Chest pain, shortness of breath, midline to LEFT side chest pain since yesterday, history hypertension, diabetes mellitus, smoker EXAM: CHEST - 2 VIEW COMPARISON:  09/11/2010 FINDINGS: Upper normal size of cardiac silhouette. Mediastinal contours and pulmonary vascularity normal. Atherosclerotic calcification aorta. Minimal bibasilar atelectasis. Lungs otherwise clear. No pulmonary infiltrate, pleural effusion, or pneumothorax. Osseous structures unremarkable. IMPRESSION: Minimal bibasilar atelectasis. Electronically Signed   By: Lavonia Dana M.D.   On: 05/03/2021 09:05   CT Renal Stone Study  Result Date: 05/03/2021 CLINICAL DATA:  Flank pain EXAM: CT ABDOMEN AND PELVIS WITHOUT CONTRAST TECHNIQUE: Multidetector CT imaging of the abdomen and pelvis was performed following the standard protocol without IV contrast. COMPARISON:  None. FINDINGS: Lower chest: Scattered granulomas are noted in the right lower lobe. No focal infiltrate or effusion is seen. Hepatobiliary: No focal liver abnormality is seen. No gallstones, gallbladder wall thickening, or biliary dilatation. Pancreas: Unremarkable. No pancreatic ductal dilatation or surrounding inflammatory changes. Spleen: Normal in size without focal abnormality. Adrenals/Urinary Tract: Adrenal glands are within normal limits. Kidneys are well visualized bilaterally with mild fullness of the collecting systems. No renal calculi are seen. The ureters are prominent to the level of the urinary bladder which has been decompressed by Foley catheter. These changes may be related to bladder wall thickening at the ureterovesical junctions. The posterior aspect of the bladder is thickened suspicious for underlying neoplasm. Stomach/Bowel:  Scattered diverticular change of the colon is noted  without evidence of diverticulitis. The appendix is well visualized and within normal limits. The small bowel and stomach are unremarkable. Vascular/Lymphatic: Aortic atherosclerosis. No enlarged abdominal or pelvic lymph nodes. Reproductive: Prostate is unremarkable. Other: No abdominal wall hernia or abnormality. No abdominopelvic ascites. Musculoskeletal: Postsurgical changes in the lower lumbar spine are noted. No acute bony abnormality is seen. IMPRESSION: Bilateral hydronephrosis without definitive calculi. The posterior wall of the bladder appears thickened and this may contribute to the hydronephrosis bilaterally. Diverticulosis without diverticulitis. Changes of prior granulomatous disease. Electronically Signed   By: Inez Catalina M.D.   On: 05/03/2021 18:50    Procedures Procedures   Medications Ordered in ED Medications  lactated ringers bolus 1,000 mL (has no administration in time range)   ED Course  I have reviewed the triage vital signs and the nursing notes.  Pertinent labs & imaging results that were available during my care of the patient were reviewed by me and considered in my medical decision making (see chart for details).  Clinical Course as of 05/03/21 2021  Mon May 03, 2021  1743 We obtained a bladder scan.  Patient appears to be retaining urine.  They found over 800 cc of urine on the scan.  Patient then spontaneously urinated on the bed and floor.  We will have nursing staff place a Foley catheter. [LJ]  1918 Creatinine(!): 1.51 [LJ]  1918 GFR, Estimated(!): 50 [LJ]    Clinical Course User Index [LJ] Rayna Sexton, PA-C   MDM Rules/Calculators/A&P                          Pt is a 70 y.o. male who presents to the emergency department due to suprapubic pain.  Labs: CBC with a white count of 18.6, hemoglobin of 17.4, neutrophils of 15.9, monocytes 1.7, absolute immature granulocytes of 0.1. CMP with a sodium  of 134, glucose of 259, BUN of 25, creatinine of 1.51, alk phos of 130, total bilirubin of 1.4, GFR 50. UA showing glucosuria greater than 500, small hemoglobin, 5 ketones, 100 protein, 21-50 red blood cells. Lipase of 19. Respiratory panel is negative. Urine culture sent. Troponin of 40 with a repeat of 34.  Imaging: Chest x-ray shows minimal bibasilar atelectasis. CT renal stone study shows bilateral hydronephrosis without definitive calculi.  The posterior wall of the bladder appears thickened and this may contribute to the hydronephrosis bilaterally.  Diverticulosis without diverticulitis.  Changes of prior granulomatous disease.  I, Rayna Sexton, PA-C, personally reviewed and evaluated these images and lab results as part of my medical decision-making.  Patient appears to be retaining urine.  Initial bladder scan showing over 800 cc of urine.  Foley catheter was placed successfully and patient notes significant relief of his symptoms.  Patient with a leukocytosis and neutrophilia.  Mildly elevated troponin of 40 with a repeat of 34.  Creatinine mildly elevated at 1.51 with a GFR 50.  Likely AKI.  CT scan showing hydronephrosis as well.  UA without leukocytes or bacteria.  Unsure of the cause of patient's urinary retention.  Patient's lab work likely reactive due to his urinary retention and suprapubic pain.  Will discharge patient with his Foley catheter in place.  He was given a referral to [facility] and recommended that he call them tomorrow morning to schedule an appointment for follow-up.  Patient understands come back to the emergency department with any new or worsening symptoms.  His questions were answered and he was  amicable at the time of discharge.  Patient discussed with and evaluated by my attending physician Dr. Lacretia Leigh who is in agreement with the above plan.  Note: Portions of this report may have been transcribed using voice recognition software. Every effort  was made to ensure accuracy; however, inadvertent computerized transcription errors may be present.   Final Clinical Impression(s) / ED Diagnoses Final diagnoses:  Urinary retention  AKI (acute kidney injury) ([place])  Hydronephrosis, unspecified hydronephrosis type   Rx / DC Orders ED Discharge Orders     None        Rayna Sexton, PA-C 05/03/21 2025    Lacretia Leigh, MD 05/03/21 2249

## 2021-05-04 ENCOUNTER — Other Ambulatory Visit (HOSPITAL_COMMUNITY): Payer: Self-pay

## 2021-05-04 ENCOUNTER — Other Ambulatory Visit: Payer: Self-pay | Admitting: Family Medicine

## 2021-05-05 ENCOUNTER — Encounter (HOSPITAL_COMMUNITY): Payer: Self-pay

## 2021-05-05 ENCOUNTER — Other Ambulatory Visit (HOSPITAL_COMMUNITY): Payer: Self-pay

## 2021-05-05 ENCOUNTER — Emergency Department (HOSPITAL_COMMUNITY)
Admission: EM | Admit: 2021-05-05 | Discharge: 2021-05-05 | Disposition: A | Discharge status: Home / Self Care | Payer: Medicare HMO | Attending: Physician Assistant | Admitting: Physician Assistant

## 2021-05-05 DIAGNOSIS — K5900 Constipation, unspecified: Secondary | ICD-10-CM | POA: Insufficient documentation

## 2021-05-05 DIAGNOSIS — Z5321 Procedure and treatment not carried out due to patient leaving prior to being seen by health care provider: Secondary | ICD-10-CM | POA: Insufficient documentation

## 2021-05-05 LAB — COMPREHENSIVE METABOLIC PANEL
ALT: 20 U/L (ref 0–44)
AST: 18 U/L (ref 15–41)
Albumin: 3.1 g/dL — ABNORMAL LOW (ref 3.5–5.0)
Alkaline Phosphatase: 92 U/L (ref 38–126)
Anion gap: 9 (ref 5–15)
BUN: 16 mg/dL (ref 8–23)
CO2: 24 mmol/L (ref 22–32)
Calcium: 9 mg/dL (ref 8.9–10.3)
Chloride: 100 mmol/L (ref 98–111)
Creatinine, Ser: 0.8 mg/dL (ref 0.61–1.24)
GFR, Estimated: >60 mL/min (ref >60)
Glucose, Bld: 164 mg/dL — ABNORMAL HIGH (ref 70–99)
Potassium: 3.6 mmol/L (ref 3.5–5.1)
Sodium: 133 mmol/L — ABNORMAL LOW (ref 135–145)
Total Bilirubin: 1.3 mg/dL — ABNORMAL HIGH (ref 0.3–1.2)
Total Protein: 6.9 g/dL (ref 6.5–8.1)

## 2021-05-05 LAB — CBC WITH DIFFERENTIAL/PLATELET
Abs Immature Granulocytes: 0.04 10*3/uL (ref 0.00–0.07)
Basophils Absolute: 0 10*3/uL (ref 0.0–0.1)
Basophils Relative: 0 %
Eosinophils Absolute: 0.4 10*3/uL (ref 0.0–0.5)
Eosinophils Relative: 4 %
HCT: 46.8 % (ref 39.0–52.0)
Hemoglobin: 15.6 g/dL (ref 13.0–17.0)
Immature Granulocytes: 0 %
Lymphocytes Relative: 15 %
Lymphs Abs: 1.7 10*3/uL (ref 0.7–4.0)
MCH: 29.7 pg (ref 26.0–34.0)
MCHC: 33.3 g/dL (ref 30.0–36.0)
MCV: 89 fL (ref 80.0–100.0)
Monocytes Absolute: 1 10*3/uL (ref 0.1–1.0)
Monocytes Relative: 9 %
Neutro Abs: 8 10*3/uL — ABNORMAL HIGH (ref 1.7–7.7)
Neutrophils Relative %: 72 %
Platelets: 307 10*3/uL (ref 150–400)
RBC: 5.26 MIL/uL (ref 4.22–5.81)
RDW: 13.5 % (ref 11.5–15.5)
WBC: 11.2 10*3/uL — ABNORMAL HIGH (ref 4.0–10.5)
nRBC: 0 % (ref 0.0–0.2)

## 2021-05-05 LAB — URINE CULTURE

## 2021-05-05 LAB — LIPASE, BLOOD: Lipase: 20 U/L (ref 11–51)

## 2021-05-05 MED ORDER — METFORMIN HCL ER 500 MG PO TB24
500.0000 mg | ORAL_TABLET | Freq: Two times a day (BID) | ORAL | 2 refills | Status: DC
  Filled 2021-05-05: qty 90, 45d supply, fill #0

## 2021-05-05 NOTE — Progress Notes (Signed)
Pt stopped by office for more tresiba samples. Says he was recently put out of his home and his last samples were "probably thrown away". Last samples were provided due to pt running out & being too soon to be filled at pharmacy. Informed pt in the future that he will need to get medication at pharmacy if due & affordable.   Medication Samples have been provided to the patient.  Drug name: TRESIBA       Strength: U100        Qty: 3 BOXES  LOT: [id number]  Exp.Date: 12/17/2021  Dosing instructions: INJECT 30 UNITS DAILY (30 DAY SUPPLY).   Vista Deck 9:12 AM 05/05/2021

## 2021-05-05 NOTE — ED Provider Notes (Signed)
Emergency Medicine Provider Triage Evaluation Note  Charles Fox , a 70 y.o. male  was evaluated in triage.  Pt complains of constipation for 5 days.  Around his Foley catheter site that was placed 2 days ago.  Continues to pass gas.  Denies any abdominal pain vomiting constipation.  Review of Systems  Positive: Constipation Negative: Abdominal pain, vomiting  Physical Exam  BP 130/75 (BP Location: Left Arm)   Pulse 99   Temp 97.8 F (36.6 C) (Oral)   Resp 18   SpO2 97%  Gen:   Awake, no distress   Resp:  Normal effort  MSK:   Moves extremities without difficulty  Other:  Abdomen is soft  Medical Decision Making  Medically screening exam initiated at 12:18 PM.  Appropriate orders placed.  Charles Fox was informed that the remainder of the evaluation will be completed by another provider, this initial triage assessment does not replace that evaluation, and the importance of remaining in the ED until their evaluation is complete.  Lab work is ordered.  CT scan done without contrast 2 days ago shows no evidence of increased stool burden   Delia Heady, PA-C 05/05/21 1219    Blanchie Dessert, MD 05/05/21 1359

## 2021-05-05 NOTE — ED Triage Notes (Signed)
Pt reports constipation, last BM 5 days ago, pt has foley that was placed on Saturday when he was here and states it is leaking around to catheter site.

## 2021-05-05 NOTE — ED Notes (Signed)
Pt stated they were leaving and handed over labels then proceeded to walk out of the ED entrance doors.

## 2021-05-06 ENCOUNTER — Other Ambulatory Visit: Payer: Self-pay

## 2021-05-06 ENCOUNTER — Emergency Department (HOSPITAL_COMMUNITY)
Admission: EM | Admit: 2021-05-06 | Discharge: 2021-05-07 | Disposition: A | Discharge status: Home / Self Care | Payer: Medicare HMO | Attending: Emergency Medicine | Admitting: Emergency Medicine

## 2021-05-06 ENCOUNTER — Telehealth: Payer: Self-pay | Admitting: *Deleted

## 2021-05-06 ENCOUNTER — Ambulatory Visit: Payer: Medicare HMO

## 2021-05-06 DIAGNOSIS — I10 Essential (primary) hypertension: Secondary | ICD-10-CM | POA: Diagnosis not present

## 2021-05-06 DIAGNOSIS — E1139 Type 2 diabetes mellitus with other diabetic ophthalmic complication: Secondary | ICD-10-CM | POA: Diagnosis not present

## 2021-05-06 DIAGNOSIS — Z23 Encounter for immunization: Secondary | ICD-10-CM | POA: Diagnosis not present

## 2021-05-06 DIAGNOSIS — N179 Acute kidney failure, unspecified: Secondary | ICD-10-CM | POA: Diagnosis not present

## 2021-05-06 DIAGNOSIS — E1169 Type 2 diabetes mellitus with other specified complication: Secondary | ICD-10-CM | POA: Insufficient documentation

## 2021-05-06 DIAGNOSIS — T83098A Other mechanical complication of other indwelling urethral catheter, initial encounter: Secondary | ICD-10-CM | POA: Insufficient documentation

## 2021-05-06 DIAGNOSIS — F02811 Dementia in other diseases classified elsewhere, unspecified severity, with agitation: Secondary | ICD-10-CM | POA: Diagnosis not present

## 2021-05-06 DIAGNOSIS — E871 Hypo-osmolality and hyponatremia: Secondary | ICD-10-CM | POA: Diagnosis not present

## 2021-05-06 DIAGNOSIS — Z7982 Long term (current) use of aspirin: Secondary | ICD-10-CM | POA: Insufficient documentation

## 2021-05-06 DIAGNOSIS — J9811 Atelectasis: Secondary | ICD-10-CM | POA: Diagnosis not present

## 2021-05-06 DIAGNOSIS — R339 Retention of urine, unspecified: Secondary | ICD-10-CM | POA: Diagnosis not present

## 2021-05-06 DIAGNOSIS — E11319 Type 2 diabetes mellitus with unspecified diabetic retinopathy without macular edema: Secondary | ICD-10-CM | POA: Diagnosis not present

## 2021-05-06 DIAGNOSIS — Z79899 Other long term (current) drug therapy: Secondary | ICD-10-CM | POA: Diagnosis not present

## 2021-05-06 DIAGNOSIS — F05 Delirium due to known physiological condition: Secondary | ICD-10-CM | POA: Diagnosis not present

## 2021-05-06 DIAGNOSIS — Z794 Long term (current) use of insulin: Secondary | ICD-10-CM | POA: Insufficient documentation

## 2021-05-06 DIAGNOSIS — F17210 Nicotine dependence, cigarettes, uncomplicated: Secondary | ICD-10-CM | POA: Insufficient documentation

## 2021-05-06 DIAGNOSIS — Y846 Urinary catheterization as the cause of abnormal reaction of the patient, or of later complication, without mention of misadventure at the time of the procedure: Secondary | ICD-10-CM | POA: Insufficient documentation

## 2021-05-06 DIAGNOSIS — E1129 Type 2 diabetes mellitus with other diabetic kidney complication: Secondary | ICD-10-CM | POA: Insufficient documentation

## 2021-05-06 DIAGNOSIS — E1136 Type 2 diabetes mellitus with diabetic cataract: Secondary | ICD-10-CM | POA: Diagnosis not present

## 2021-05-06 DIAGNOSIS — Z7984 Long term (current) use of oral hypoglycemic drugs: Secondary | ICD-10-CM | POA: Insufficient documentation

## 2021-05-06 DIAGNOSIS — R4182 Altered mental status, unspecified: Secondary | ICD-10-CM | POA: Diagnosis not present

## 2021-05-06 DIAGNOSIS — G309 Alzheimer's disease, unspecified: Secondary | ICD-10-CM | POA: Diagnosis not present

## 2021-05-06 DIAGNOSIS — Z20822 Contact with and (suspected) exposure to covid-19: Secondary | ICD-10-CM | POA: Diagnosis not present

## 2021-05-06 DIAGNOSIS — E785 Hyperlipidemia, unspecified: Secondary | ICD-10-CM | POA: Insufficient documentation

## 2021-05-06 DIAGNOSIS — T839XXA Unspecified complication of genitourinary prosthetic device, implant and graft, initial encounter: Secondary | ICD-10-CM

## 2021-05-06 DIAGNOSIS — E113593 Type 2 diabetes mellitus with proliferative diabetic retinopathy without macular edema, bilateral: Secondary | ICD-10-CM | POA: Diagnosis not present

## 2021-05-06 DIAGNOSIS — R739 Hyperglycemia, unspecified: Secondary | ICD-10-CM | POA: Diagnosis not present

## 2021-05-06 DIAGNOSIS — R45851 Suicidal ideations: Secondary | ICD-10-CM | POA: Diagnosis not present

## 2021-05-06 DIAGNOSIS — R319 Hematuria, unspecified: Secondary | ICD-10-CM | POA: Diagnosis not present

## 2021-05-06 LAB — URINALYSIS, ROUTINE W REFLEX MICROSCOPIC
Bilirubin Urine: NEGATIVE
Glucose, UA: NEGATIVE mg/dL
Ketones, ur: NEGATIVE mg/dL
Nitrite: NEGATIVE
Protein, ur: 100 mg/dL — AB
RBC / HPF: >50 RBC/hpf — ABNORMAL HIGH (ref 0–5)
Specific Gravity, Urine: 1.015 (ref 1.005–1.030)
pH: 6 (ref 5.0–8.0)

## 2021-05-06 NOTE — Chronic Care Management (AMB) (Signed)
  Care Management   Note  05/06/2021 Name: Charles Fox MRN: [id number] DOB: 1951-03-19  Charles Fox is a 70 y.o. year old male who is a primary care patient of [name], MD. I reached out to [name] by phone today in response to a referral sent by Charles Fox's Dr. Nancy Fetter.   Charles Fox was given information about care management services today including:  Care management services include personalized support from designated clinical staff supervised by his physician, including individualized plan of care and coordination with other care providers 24/7 contact phone numbers for assistance for urgent and routine care needs. The patient may stop care management services at any time by phone call to the office staff.  Patient agreed to services and verbal consent obtained.   Follow up plan: Telephone appointment with care management team member scheduled for:05/10/21  [name]  Care Guide, Embedded Care Coordination [place]  Care Management  Direct Dial: [phone]

## 2021-05-06 NOTE — Chronic Care Management (AMB) (Deleted)
Care Management  {CCM COLLABORATION:25131} Note  05/06/2021 Name: Charles Fox MRN: [id number] DOB: 22-Mar-1951  PAVLE HARIG is a 70 y.o. year old male who is a primary care patient of [name], MD. The CCM team was consulted for Collaboration  reference chronic disease management and or care coordination needs.    Assessment: {assessment insert:25156}. See Care Plan below for interventions and patient self-care actives.  Intervention: {assessment insert:26093}  CCM *** collaborated with *** .  Conducted brief assessment, recommendations and relevant information discussed.   Follow up Plan: { CCM Tcolesfollowupplan :25177}  Collaboration with [name], MD regarding development and update of comprehensive plan of care as evidenced by provider attestation and co-signature Review of patient past medical history, allergies, medications, and health status, including review of pertinent consultant reports was performed as part of comprehensive evaluation and provision of care management/care coordination services.   Care Plan Conditions to be addressed/monitored per PCP order: {CCM ASSESSMENT DZ OPTIONS:22384}, {CCM SW BARRIERS:22256}  Patient Care Plan: obtain new housing     Problem Identified: unable to afford current housing   Priority: Medium     Long-Range Goal: Comfort Maintained with affordable housing   Start Date: 07/15/2020  Expected End Date: 12/17/2020  Recent Progress: Not on track  Priority: Medium  Note:   Current Barriers:  Confusion at times Has contacted apartment on [address] and will move forward  Waiting on update from [employer] has contacted patient and provided him a list of housing options.   [employer] Case management has contact patient and assisted him with online applications.    Difficult with navigating systems to complete process to get housing unit.   Clinical Goal(s) : assist  with navigating housing options  Interventions provided by LCSW:  Assessment of needs, barriers and progress   Teach back of next steps Solution-Focused Strategies;Problem Solving    collaboration with [name], MD regarding development and update of comprehensive plan of care as evidenced by provider attestation and co-signature Inter-disciplinary care team collaboration (see longitudinal plan of care) Patient Goals/Self-Care Activities: Over the next 30 days Follow-up with apartments that you like on [address]. Continue to work with [facility]  [address] [place], [place] [address] [phone] Continue to work with [facility]:  [place], [place], [place] [address]  [phone]  Follow Up Plan: LCSW will continue to collaborate with CCM RN and PCP for ongoing needs    Problem Identified: Acute Food Insecurity      Goal: Over the next 10 days, patient will  be in contact with the care guides for help with food   Start Date: 11/10/2020  Expected End Date: 11/17/2020  This Visit's Progress: Not on track  Recent Progress: On track  Priority: High  Note:   Current barriers:  Food insecurity patient has no money on his food stamp card or other means at this time to get food Referral placed to Care Guides to for resources for food they will contacted the patient  Patient reports he has food stamps but he only has .49 cents on the card and it will not be re supplied until the 19th of next month.  RNCM and Patient called the food stamp number together to find out information at [phone] acknowledges deficits with meeting this unmet need  Clinical Interventions: collaboration with [name], MD regarding development and update of comprehensive plan of care  as evidenced by provider attestation and co-signature Inter-disciplinary care team collaboration (see longitudinal plan of care) Assessment of needs, barriers  , agencies contacted, as well as how impacting  Review various resources and discussed options   Collaborated with LCSW [place]  Patient Goals/Self-Care Activities: Over the next 10 days stay near his phone and answer calls from care guides    Patient Care Plan: RN Case Manager     Problem Identified: Glycemic Management (Diabetes, Type 2)      Long-Range Goal: Glycemic Management Optimized   Start Date: 02/26/2020  Expected End Date: 01/16/2021  This Visit's Progress: Not on track  Recent Progress: Not on track  Priority: High  Note:   Objective:  Lab Results  Component Value Date   HGBA1C 7.6 (A) 09/10/2020   Lab Results  Component Value Date   CREATININE 0.74 (L) 05/26/2020   CREATININE 0.68 10/15/2019   CREATININE 0.82 04/11/2019   Current Barriers:  Knowledge Deficits related to basic Diabetes pathophysiology and self care/management Literacy barriers  Case Manager Clinical Goal(s):  Over the next 90 days, patient will demonstrate improved adherence to prescribed treatment plan for diabetes self care/management as evidenced by: daily monitoring and recording of CBG and adherence to prescribed medication regimen.  Interventions:  Provided education to patient about basic DM disease process Reviewed medications with patient and discussed importance of medication adherence-  Patient reports he doesn't have any of his medications except for Tresiba. Patient agreed to pill packs. RNCM contacted [employer] [phone] and spoke with Mitzi Hansen to request the pill packs. RNCM sent patients primary care physician request to send his prescriptions to the pharmacy and the pharmacy was changed in his chart.  Discussed plans with patient for ongoing care management follow up and provided patient with direct contact information for care management team Advised patient, providing education and rationale, to check cbg  and record, calling the office for findings outside  established parameters.   Review of patient status, including review of consultants reports, relevant laboratory and other test results, and medications completed. activity based on tolerance and functional limitations encouraged- patient states that he has been able to walk daily healthy lifestyle promoted Anticipate A1C testing (point-of-care) every 3 to 6 months based on goal attainment.  Promote self-monitoring of blood glucose levels. The patient is not checking his blood sugars.  Discussed with the patient about the importance of checking his blood sugars.   He states that he understands but feels that his fingers are to tough. The patient has his food stamp card.  RNCM discussed with patient appropriate food choices  when using his food stamp card. blood glucose readings reviewed mutual A1C goal set or reviewed  Patient Goals/Self Care Activities:  Patient verbalizes understanding of plan Self-administers medications as prescribed Calls pharmacy for medication refills Call's provider office for new concerns or questions check blood sugar at prescribed times Getting the Relion glucometer at [facility] to check his blood ssugars.         [name] RN, BSN, [facility] Care Management Coordinator [place] Phone: [phone] I Fax: [phone]

## 2021-05-06 NOTE — Chronic Care Management (AMB) (Signed)
Care Management  Collaboration  Note  05/06/2021 Name: Charles Fox MRN: [id number] DOB: [birth year]  Charles Fox is a 70 y.o. year old male who is a primary care patient of [name], MD. The CCM team was consulted for Collaboration  reference chronic disease management and or care coordination needs.    Assessment:  RNCM reviewed [facility] report and the patient has been to the ER 4 times within the past month.     Recommendation:  RNCM contact Care guide Kandra Nicolas and asked her to call the patient and offer services and set up and appointment for both LCSW Moore and myself to talk with the patient so that we may be able to offer resources.  She was able to contact the patient and he states that he was put out of his home.  He told her that his son offered for him to come stay with him but he does not want to be around the children.  Intervention: Patient was not interviewed or contacted during this encounter.    CCM RNCM collaborated with [employer] .  Conducted brief assessment, recommendations and relevant information discussed.   Follow up Plan:  RNCM and LCSW Laurance Flatten will follow up with the patient on 05/10/21  Collaboration with [name], MD regarding development and update of comprehensive plan of care as evidenced by provider attestation and co-signature Review of patient past medical history, allergies, medications, and health status, including review of pertinent consultant reports was performed as part of comprehensive evaluation and provision of care management/care coordination services.       [name] RN, BSN, [facility] Care Management Coordinator [place] Phone: [phone] I Fax: [phone]

## 2021-05-06 NOTE — Discharge Instructions (Addendum)
You have a catheter in place.  This is a flexible tube that goes through your penis into your bladder to drain your urine.  As this is constantly draining your urine you will not need to urinate normally while this is in.  You need to keep the catheter connected to a drainage bag or your catheter will leak urine out of the tube.

## 2021-05-06 NOTE — ED Triage Notes (Addendum)
Pt reports that he had a "stopper put up my penis the other day when I was here" and has not urinated since Saturday. Denies abdominal pain. On assessment, patient has tubing from catheter still intact but bag has been removed. Pt reports that something has been "dripping all over me" for the last several days but he took the bag off.

## 2021-05-06 NOTE — ED Provider Notes (Signed)
Emergency Medicine Provider Triage Evaluation Note  Charles Fox , a 70 y.o. male  was evaluated in triage.  Pt complains of difficulty urinating and requesting Foley catheter removed.  Per chart review patient was seen on 8/15 with complaints of urinary retention.  Bladder was found to have 800 cc of urine, Foley catheter placed.  [facility] was given [facility] to [facility].  Patient reports that he has not called or followed up with [facility].  Patient reports that he did not receive a bag for urine collection.  Patient is confused over need for Foley catheter and follow-up.  Review of Systems  Positive: Urinary retention Negative: Abdominal pain, nausea, vomiting  Physical Exam  BP 133/69 (BP Location: Right Arm)   Pulse (!) 103   Temp 99.2 F (37.3 C)   Resp 18   SpO2 94%  Gen:   Awake, no distress   Resp:  Normal effort  MSK:   Moves extremities without difficulty  Other:  Abdomen protuberant, soft, nondistended, nontender, no guarding or rebound tenderness.  Patient has Foley catheter inserted into penis, no collection bag.  Patient's pants are wet with urine.  Male RN was present for GU exam.  Medical Decision Making  Medically screening exam initiated at 4:07 PM.  Appropriate orders placed.  GABOR GAUNA was informed that the remainder of the evaluation will be completed by another provider, this initial triage assessment does not replace that evaluation, and the importance of remaining in the ED until their evaluation is complete.  Will obtain bladder scan in triage.  If bladder scan is unremarkable patient will need collection bag for his Foley catheter.  Patient would likely be candidate for discharge at that time.   Loni Beckwith, PA-C [id number]    Lianne Cure, DO [id number]

## 2021-05-06 NOTE — ED Provider Notes (Signed)
[facility] EMERGENCY DEPARTMENT Provider Note   CSN: [id number] Arrival date & time: 05/06/21  1552     History Chief Complaint  Patient presents with   Urinary Retention    Charles Fox is a 70 y.o. male with a past medical history of hypertension, DM, hyperlipidemia, who presents today for evaluation of Foley complications. Patient was recently seen in the emergency room, found to be retaining urine and had a Foley placed.  He states he has not been urinating since the Foley catheter was put in.  Per triage note by S. Isaiah Blakes RN "Pt reports that he had a "stopper put up my penis the other day when I was here" and has not urinated since Saturday. Denies abdominal pain. On assessment, patient has tubing from catheter still intact but bag has been removed. Pt reports that something has been "dripping all over me" for the last several days but he took the bag off. "  Per triage note by Gasper Lloyd. NT  "Pt came into triage without a foley bag but foley still inside the bladder. This tech noticed urine coming from foley onto the floor. This tech bladder scan pt and there was 0 ml of urine. This tech placed a new foley bag onto the foley cathter. Educated pt about foley bag care. Pt placed back into the lobber to be fuller evaluated once seen by provider in the exam room"  Patient states that he didn't realize the catheter wound drain the urine.  He denies other complainants or concerns.    HPI     Past Medical History:  Diagnosis Date   Cataract    Mixed form OU   Diabetes mellitus without complication ([place])    Diabetic retinopathy ([place])    PDR OU   Hypertension    Hypertensive retinopathy    OU    Patient Active Problem List   Diagnosis Date Noted   Chest pain 02/26/2021   Acute right-sided thoracic back pain 11/27/2020   History of lacunar cerebrovascular accident 06/11/2020   Dementia without behavioral disturbance ([place]) 05/27/2020   Impaired vision  05/27/2020   Pain, dental 11/20/2019   High priority for COVID-19 virus vaccination 11/20/2019   Chronic pain of both shoulders 09/10/2019   Tendinopathy of left rotator cuff 04/29/2019   Food insecurity 07/09/2018   Skin lesions, generalized 05/09/2018   Hyperlipidemia associated with type 2 diabetes mellitus ([place]) 12/22/2016   Urine test positive for microalbuminuria 05/27/2016   Type 2 diabetes mellitus with neurological complications ([place]) 49/17/9150   Morbid obesity ([place]) 09/04/2014   Hypertension associated with diabetes ([place]) 09/04/2014   Tobacco use disorder 09/04/2014    Past Surgical History:  Procedure Laterality Date   BACK SURGERY     FINGER SURGERY         No family history on file.  Social History   Tobacco Use   Smoking status: Every Day    Packs/day: 0.50    Types: Cigarettes   Smokeless tobacco: Never  Vaping Use   Vaping Use: Never used  Substance Use Topics   Alcohol use: No   Drug use: Not Currently    Home Medications Prior to Admission medications   Medication Sig Start Date End Date Taking? Authorizing Provider  Accu-Chek Softclix Lancets lancets Use as instructed 07/08/20   [name], MD  Alcohol Swabs PADS He is to check blood glucose 2-3 times a day 12/04/17   Mercy Riding, MD  aspirin  EC 81 MG tablet Take 1 tablet (81 mg total) by mouth daily. Swallow whole. 10/14/20   [name], MD  atorvastatin (LIPITOR) 40 MG tablet Take 1 tablet (40 mg total) by mouth daily. 10/14/20   [name], MD  Blood Glucose Monitoring Suppl (ACCU-CHEK AVIVA PLUS) w/Device KIT Check blood glucose before in AM while fasting. 07/08/20   [name], MD  carvedilol (COREG) 3.125 MG tablet Take 1 tablet (3.125 mg total) by mouth 2 (two) times daily with a meal. 02/25/21   Wilber Oliphant, MD  dorzolamide-timolol (COSOPT) 22.3-6.8 MG/ML ophthalmic solution Place 1 drop into both eyes 4 (four) times daily. 07/02/20 07/02/21  Bernarda Caffey, MD  glucose  blood (ACCU-CHEK AVIVA PLUS) test strip Use as instructed 07/08/20   [name], MD  insulin degludec (TRESIBA FLEXTOUCH) 100 UNIT/ML FlexTouch Pen INJECT 30 UNITS INTO THE SKIN DAILY. 02/24/21   [name], MD  Insulin Pen Needle 31G X 5 MM MISC USE TO INJECT YOUR INSULIN ONCE DAILY 11/27/20 11/27/21  [name], MD  losartan (COZAAR) 50 MG tablet Take 1 tablet (50 mg total) by mouth at bedtime. 11/27/20 11/22/21  Daisy Floro, DO  metFORMIN (GLUCOPHAGE-XR) 500 MG 24 hr tablet Take 1 tablet (500 mg total) by mouth 2 (two) times daily with a meal. 05/05/21   [name], MD  nitroGLYCERIN (NITROSTAT) 0.4 MG SL tablet Place 1 tablet (0.4 mg total) under the tongue every 5 (five) minutes as needed for chest pain. 02/25/21   Wilber Oliphant, MD    Allergies    Patient has no known allergies.  Review of Systems   Review of Systems  Constitutional:  Negative for chills and fever.  Endocrine: Negative for polyuria.  Genitourinary:  Positive for decreased urine volume (Patient reports he hasn't urinated since cather inserted.  He has been drainaing urine as he took off catheter bag.). Negative for flank pain.   Physical Exam Updated Vital Signs BP (!) 164/84 (BP Location: Right Arm)   Pulse 85   Temp 98.5 F (36.9 C) (Oral)   Resp 20   SpO2 100%   Physical Exam Vitals and nursing note reviewed.  Constitutional:      General: He is not in acute distress.    Appearance: He is not diaphoretic.  HENT:     Head: Normocephalic and atraumatic.  Eyes:     General: No scleral icterus.       Right eye: No discharge.        Left eye: No discharge.     Conjunctiva/sclera: Conjunctivae normal.  Cardiovascular:     Rate and Rhythm: Normal rate and regular rhythm.  Pulmonary:     Effort: Pulmonary effort is normal. No respiratory distress.     Breath sounds: No stridor.  Abdominal:     General: There is no distension.     Tenderness: There is no abdominal tenderness. There is no  guarding.  Musculoskeletal:        General: No deformity.     Cervical back: Normal range of motion.  Skin:    General: Skin is warm and dry.  Neurological:     Mental Status: He is alert.     Motor: No abnormal muscle tone.  Psychiatric:        Behavior: Behavior normal.    ED Results / Procedures / Treatments   Labs (all labs ordered are listed, but only abnormal results are displayed) Labs Reviewed  URINALYSIS, ROUTINE W REFLEX MICROSCOPIC - Abnormal; Notable for the following components:      Result Value   Color, Urine AMBER (*)    APPearance HAZY (*)    Hgb urine dipstick LARGE (*)    Protein, ur 100 (*)    Leukocytes,Ua MODERATE (*)    RBC / HPF >50 (*)    Bacteria, UA RARE (*)    All other components within normal limits  URINE CULTURE    EKG None  Radiology No results found.  Procedures Procedures   Medications Ordered in ED Medications - No data to display  ED Course  I have reviewed the triage vital signs and the nursing notes.  Pertinent labs & imaging results that were available during my care of the patient were reviewed by me and considered in my medical decision making (see chart for details).    MDM Rules/Calculators/A&P                           Patient is a 70 year old man who presents today for concern that he has not urinated since Saturday.  He recently had a Foley catheter placed, and according to chart review on his arrival here had removed the bag from the catheter and tubing. Patient had a bladder scan with 0 mL of urine in triage. It appears that patient did not understand the Foley catheter role and use/care  Was educated on that with a Foley and he will not be urinating normally, the need to care for the Foley catheter appropriately and that he needs to use a collection bag.  He states his understanding.  UA was sent, shows rare bacteria, over 50 reds and 11-20 white cells.  Patient does not clearly have infection however given  that he reports feeling like he is having irritation will send for culture. Given no clear infection and a catheterized patient will hold on antibiotics and defer until culture results.  Return precautions were discussed with patient who states their understanding.  At the time of discharge patient denied any unaddressed complaints or concerns.  Patient is agreeable for discharge home.  Note: Portions of this report may have been transcribed using voice recognition software. Every effort was made to ensure accuracy; however, inadvertent computerized transcription errors may be present   Final Clinical Impression(s) / ED Diagnoses Final diagnoses:  Problem with Foley catheter, initial encounter [facility])    Rx / DC Orders ED Discharge Orders     None        Lorin Glass, PA-C 05/07/21 0023    Tegeler, Gwenyth Allegra, MD 05/10/21 1650

## 2021-05-06 NOTE — ED Notes (Signed)
Unable to locate bladder scanner. PA made aware.

## 2021-05-06 NOTE — ED Notes (Signed)
Pt came into triage without a foley bag but foley still inside the bladder. This tech noticed urine coming from foley onto the floor. This tech bladder scan pt and there was 0 ml of urine. This tech placed a new foley bag onto the foley cathter. Educated pt about foley bag care. Pt placed back into the lobber to be fuller evaluated once seen by provider in the exam room.

## 2021-05-07 ENCOUNTER — Encounter (HOSPITAL_COMMUNITY): Payer: Self-pay | Admitting: Emergency Medicine

## 2021-05-07 ENCOUNTER — Encounter: Payer: Self-pay | Admitting: Student

## 2021-05-07 ENCOUNTER — Other Ambulatory Visit: Payer: Self-pay | Admitting: Family Medicine

## 2021-05-07 ENCOUNTER — Emergency Department (HOSPITAL_COMMUNITY): Payer: Medicare HMO

## 2021-05-07 ENCOUNTER — Inpatient Hospital Stay: Admission: AD | Admit: 2021-05-07 | Payer: Medicare HMO | Source: Ambulatory Visit | Admitting: Family Medicine

## 2021-05-07 ENCOUNTER — Other Ambulatory Visit: Payer: Self-pay

## 2021-05-07 ENCOUNTER — Ambulatory Visit (INDEPENDENT_AMBULATORY_CARE_PROVIDER_SITE_OTHER): Payer: Medicare HMO | Admitting: Student

## 2021-05-07 ENCOUNTER — Emergency Department (HOSPITAL_COMMUNITY)
Admission: EM | Admit: 2021-05-07 | Discharge: 2021-05-07 | Disposition: A | Discharge status: Home / Self Care | Payer: Medicare HMO | Source: Home / Self Care

## 2021-05-07 ENCOUNTER — Inpatient Hospital Stay (HOSPITAL_COMMUNITY)
Admission: EM | Admit: 2021-05-07 | Discharge: 2021-09-24 | DRG: 057 | Disposition: A | Discharge status: Skilled Nursing Facility | Payer: Medicare HMO | Source: Ambulatory Visit | Attending: Family Medicine | Admitting: Family Medicine

## 2021-05-07 VITALS — BP 190/95 | HR 114 | Wt 240.4 lb

## 2021-05-07 DIAGNOSIS — Z6836 Body mass index (BMI) 36.0-36.9, adult: Secondary | ICD-10-CM

## 2021-05-07 DIAGNOSIS — D72829 Elevated white blood cell count, unspecified: Secondary | ICD-10-CM | POA: Diagnosis present

## 2021-05-07 DIAGNOSIS — Z658 Other specified problems related to psychosocial circumstances: Secondary | ICD-10-CM

## 2021-05-07 DIAGNOSIS — F05 Delirium due to known physiological condition: Secondary | ICD-10-CM | POA: Diagnosis not present

## 2021-05-07 DIAGNOSIS — C679 Malignant neoplasm of bladder, unspecified: Secondary | ICD-10-CM | POA: Diagnosis present

## 2021-05-07 DIAGNOSIS — R319 Hematuria, unspecified: Secondary | ICD-10-CM | POA: Diagnosis not present

## 2021-05-07 DIAGNOSIS — I7121 Aneurysm of the ascending aorta, without rupture: Secondary | ICD-10-CM | POA: Diagnosis present

## 2021-05-07 DIAGNOSIS — H35033 Hypertensive retinopathy, bilateral: Secondary | ICD-10-CM | POA: Diagnosis present

## 2021-05-07 DIAGNOSIS — R739 Hyperglycemia, unspecified: Secondary | ICD-10-CM | POA: Diagnosis not present

## 2021-05-07 DIAGNOSIS — E11319 Type 2 diabetes mellitus with unspecified diabetic retinopathy without macular edema: Secondary | ICD-10-CM | POA: Diagnosis not present

## 2021-05-07 DIAGNOSIS — E871 Hypo-osmolality and hyponatremia: Secondary | ICD-10-CM | POA: Diagnosis present

## 2021-05-07 DIAGNOSIS — Z5321 Procedure and treatment not carried out due to patient leaving prior to being seen by health care provider: Secondary | ICD-10-CM | POA: Insufficient documentation

## 2021-05-07 DIAGNOSIS — Z638 Other specified problems related to primary support group: Secondary | ICD-10-CM

## 2021-05-07 DIAGNOSIS — S0990XA Unspecified injury of head, initial encounter: Secondary | ICD-10-CM | POA: Diagnosis not present

## 2021-05-07 DIAGNOSIS — Y92231 Patient bathroom in hospital as the place of occurrence of the external cause: Secondary | ICD-10-CM | POA: Diagnosis not present

## 2021-05-07 DIAGNOSIS — Z8673 Personal history of transient ischemic attack (TIA), and cerebral infarction without residual deficits: Secondary | ICD-10-CM

## 2021-05-07 DIAGNOSIS — I10 Essential (primary) hypertension: Secondary | ICD-10-CM | POA: Diagnosis not present

## 2021-05-07 DIAGNOSIS — G309 Alzheimer's disease, unspecified: Secondary | ICD-10-CM | POA: Diagnosis present

## 2021-05-07 DIAGNOSIS — I959 Hypotension, unspecified: Secondary | ICD-10-CM | POA: Diagnosis not present

## 2021-05-07 DIAGNOSIS — F0281 Dementia in other diseases classified elsewhere with behavioral disturbance: Secondary | ICD-10-CM | POA: Diagnosis not present

## 2021-05-07 DIAGNOSIS — W01198A Fall on same level from slipping, tripping and stumbling with subsequent striking against other object, initial encounter: Secondary | ICD-10-CM | POA: Diagnosis not present

## 2021-05-07 DIAGNOSIS — J9811 Atelectasis: Secondary | ICD-10-CM | POA: Diagnosis present

## 2021-05-07 DIAGNOSIS — E785 Hyperlipidemia, unspecified: Secondary | ICD-10-CM | POA: Diagnosis present

## 2021-05-07 DIAGNOSIS — F332 Major depressive disorder, recurrent severe without psychotic features: Secondary | ICD-10-CM

## 2021-05-07 DIAGNOSIS — E1159 Type 2 diabetes mellitus with other circulatory complications: Secondary | ICD-10-CM | POA: Diagnosis not present

## 2021-05-07 DIAGNOSIS — R079 Chest pain, unspecified: Secondary | ICD-10-CM

## 2021-05-07 DIAGNOSIS — E113593 Type 2 diabetes mellitus with proliferative diabetic retinopathy without macular edema, bilateral: Secondary | ICD-10-CM | POA: Diagnosis present

## 2021-05-07 DIAGNOSIS — Z751 Person awaiting admission to adequate facility elsewhere: Secondary | ICD-10-CM

## 2021-05-07 DIAGNOSIS — R531 Weakness: Secondary | ICD-10-CM | POA: Diagnosis not present

## 2021-05-07 DIAGNOSIS — G308 Other Alzheimer's disease: Secondary | ICD-10-CM | POA: Diagnosis not present

## 2021-05-07 DIAGNOSIS — Z59811 Housing instability, housed, with risk of homelessness: Secondary | ICD-10-CM | POA: Diagnosis not present

## 2021-05-07 DIAGNOSIS — K5900 Constipation, unspecified: Secondary | ICD-10-CM | POA: Diagnosis not present

## 2021-05-07 DIAGNOSIS — E1149 Type 2 diabetes mellitus with other diabetic neurological complication: Secondary | ICD-10-CM | POA: Diagnosis present

## 2021-05-07 DIAGNOSIS — F17200 Nicotine dependence, unspecified, uncomplicated: Secondary | ICD-10-CM | POA: Diagnosis not present

## 2021-05-07 DIAGNOSIS — Z781 Physical restraint status: Secondary | ICD-10-CM

## 2021-05-07 DIAGNOSIS — R5381 Other malaise: Secondary | ICD-10-CM | POA: Diagnosis not present

## 2021-05-07 DIAGNOSIS — E1139 Type 2 diabetes mellitus with other diabetic ophthalmic complication: Secondary | ICD-10-CM | POA: Diagnosis not present

## 2021-05-07 DIAGNOSIS — Z23 Encounter for immunization: Secondary | ICD-10-CM

## 2021-05-07 DIAGNOSIS — E119 Type 2 diabetes mellitus without complications: Secondary | ICD-10-CM | POA: Diagnosis not present

## 2021-05-07 DIAGNOSIS — R45851 Suicidal ideations: Secondary | ICD-10-CM | POA: Diagnosis not present

## 2021-05-07 DIAGNOSIS — F17210 Nicotine dependence, cigarettes, uncomplicated: Secondary | ICD-10-CM | POA: Diagnosis not present

## 2021-05-07 DIAGNOSIS — F323 Major depressive disorder, single episode, severe with psychotic features: Secondary | ICD-10-CM | POA: Diagnosis not present

## 2021-05-07 DIAGNOSIS — F02811 Dementia in other diseases classified elsewhere, unspecified severity, with agitation: Secondary | ICD-10-CM | POA: Diagnosis present

## 2021-05-07 DIAGNOSIS — N179 Acute kidney failure, unspecified: Secondary | ICD-10-CM

## 2021-05-07 DIAGNOSIS — R400 Somnolence: Secondary | ICD-10-CM | POA: Diagnosis not present

## 2021-05-07 DIAGNOSIS — F0390 Unspecified dementia without behavioral disturbance: Secondary | ICD-10-CM | POA: Diagnosis not present

## 2021-05-07 DIAGNOSIS — F02C2 Dementia in other diseases classified elsewhere, severe, with psychotic disturbance: Secondary | ICD-10-CM | POA: Diagnosis not present

## 2021-05-07 DIAGNOSIS — G9340 Encephalopathy, unspecified: Secondary | ICD-10-CM | POA: Diagnosis not present

## 2021-05-07 DIAGNOSIS — F322 Major depressive disorder, single episode, severe without psychotic features: Secondary | ICD-10-CM | POA: Diagnosis not present

## 2021-05-07 DIAGNOSIS — T8386XA Thrombosis of genitourinary prosthetic devices, implants and grafts, initial encounter: Secondary | ICD-10-CM | POA: Diagnosis not present

## 2021-05-07 DIAGNOSIS — N399 Disorder of urinary system, unspecified: Secondary | ICD-10-CM | POA: Diagnosis not present

## 2021-05-07 DIAGNOSIS — Z87448 Personal history of other diseases of urinary system: Secondary | ICD-10-CM | POA: Diagnosis not present

## 2021-05-07 DIAGNOSIS — T83098A Other mechanical complication of other indwelling urethral catheter, initial encounter: Secondary | ICD-10-CM | POA: Diagnosis not present

## 2021-05-07 DIAGNOSIS — I152 Hypertension secondary to endocrine disorders: Secondary | ICD-10-CM | POA: Diagnosis present

## 2021-05-07 DIAGNOSIS — Y846 Urinary catheterization as the cause of abnormal reaction of the patient, or of later complication, without mention of misadventure at the time of the procedure: Secondary | ICD-10-CM | POA: Diagnosis not present

## 2021-05-07 DIAGNOSIS — M545 Low back pain, unspecified: Secondary | ICD-10-CM | POA: Diagnosis not present

## 2021-05-07 DIAGNOSIS — R0602 Shortness of breath: Secondary | ICD-10-CM | POA: Diagnosis not present

## 2021-05-07 DIAGNOSIS — Z960 Presence of urogenital implants: Secondary | ICD-10-CM | POA: Diagnosis present

## 2021-05-07 DIAGNOSIS — F0391 Unspecified dementia with behavioral disturbance: Secondary | ICD-10-CM | POA: Diagnosis not present

## 2021-05-07 DIAGNOSIS — Z79899 Other long term (current) drug therapy: Secondary | ICD-10-CM

## 2021-05-07 DIAGNOSIS — D649 Anemia, unspecified: Secondary | ICD-10-CM | POA: Diagnosis not present

## 2021-05-07 DIAGNOSIS — E876 Hypokalemia: Secondary | ICD-10-CM | POA: Diagnosis not present

## 2021-05-07 DIAGNOSIS — R338 Other retention of urine: Secondary | ICD-10-CM | POA: Diagnosis present

## 2021-05-07 DIAGNOSIS — Z20822 Contact with and (suspected) exposure to covid-19: Secondary | ICD-10-CM | POA: Diagnosis present

## 2021-05-07 DIAGNOSIS — Z720 Tobacco use: Secondary | ICD-10-CM | POA: Diagnosis not present

## 2021-05-07 DIAGNOSIS — R410 Disorientation, unspecified: Secondary | ICD-10-CM | POA: Diagnosis not present

## 2021-05-07 DIAGNOSIS — R0789 Other chest pain: Secondary | ICD-10-CM | POA: Diagnosis not present

## 2021-05-07 DIAGNOSIS — R339 Retention of urine, unspecified: Secondary | ICD-10-CM | POA: Diagnosis not present

## 2021-05-07 DIAGNOSIS — E1136 Type 2 diabetes mellitus with diabetic cataract: Secondary | ICD-10-CM | POA: Diagnosis not present

## 2021-05-07 DIAGNOSIS — E1169 Type 2 diabetes mellitus with other specified complication: Secondary | ICD-10-CM | POA: Diagnosis present

## 2021-05-07 DIAGNOSIS — N320 Bladder-neck obstruction: Secondary | ICD-10-CM | POA: Diagnosis not present

## 2021-05-07 DIAGNOSIS — Z5902 Unsheltered homelessness: Secondary | ICD-10-CM

## 2021-05-07 DIAGNOSIS — Y92239 Unspecified place in hospital as the place of occurrence of the external cause: Secondary | ICD-10-CM | POA: Diagnosis not present

## 2021-05-07 DIAGNOSIS — R4182 Altered mental status, unspecified: Secondary | ICD-10-CM | POA: Diagnosis not present

## 2021-05-07 DIAGNOSIS — Z87442 Personal history of urinary calculi: Secondary | ICD-10-CM

## 2021-05-07 DIAGNOSIS — N401 Enlarged prostate with lower urinary tract symptoms: Secondary | ICD-10-CM | POA: Diagnosis present

## 2021-05-07 DIAGNOSIS — I951 Orthostatic hypotension: Secondary | ICD-10-CM | POA: Diagnosis not present

## 2021-05-07 DIAGNOSIS — I2510 Atherosclerotic heart disease of native coronary artery without angina pectoris: Secondary | ICD-10-CM | POA: Diagnosis present

## 2021-05-07 DIAGNOSIS — R29818 Other symptoms and signs involving the nervous system: Secondary | ICD-10-CM | POA: Diagnosis not present

## 2021-05-07 DIAGNOSIS — R6889 Other general symptoms and signs: Secondary | ICD-10-CM | POA: Diagnosis not present

## 2021-05-07 DIAGNOSIS — E1129 Type 2 diabetes mellitus with other diabetic kidney complication: Secondary | ICD-10-CM | POA: Diagnosis not present

## 2021-05-07 DIAGNOSIS — F02C18 Dementia in other diseases classified elsewhere, severe, with other behavioral disturbance: Secondary | ICD-10-CM | POA: Diagnosis not present

## 2021-05-07 DIAGNOSIS — R52 Pain, unspecified: Secondary | ICD-10-CM

## 2021-05-07 DIAGNOSIS — Z7982 Long term (current) use of aspirin: Secondary | ICD-10-CM

## 2021-05-07 DIAGNOSIS — Z794 Long term (current) use of insulin: Secondary | ICD-10-CM

## 2021-05-07 DIAGNOSIS — Z599 Problem related to housing and economic circumstances, unspecified: Secondary | ICD-10-CM | POA: Diagnosis not present

## 2021-05-07 DIAGNOSIS — S30812A Abrasion of penis, initial encounter: Secondary | ICD-10-CM | POA: Diagnosis not present

## 2021-05-07 DIAGNOSIS — F4310 Post-traumatic stress disorder, unspecified: Secondary | ICD-10-CM | POA: Diagnosis present

## 2021-05-07 DIAGNOSIS — Z7984 Long term (current) use of oral hypoglycemic drugs: Secondary | ICD-10-CM

## 2021-05-07 HISTORY — DX: Other cerebral infarction due to occlusion or stenosis of small artery: I63.81

## 2021-05-07 LAB — COMPREHENSIVE METABOLIC PANEL
ALT: 24 U/L (ref 0–44)
AST: 24 U/L (ref 15–41)
Albumin: 3.1 g/dL — ABNORMAL LOW (ref 3.5–5.0)
Alkaline Phosphatase: 99 U/L (ref 38–126)
Anion gap: 10 (ref 5–15)
BUN: 9 mg/dL (ref 8–23)
CO2: 23 mmol/L (ref 22–32)
Calcium: 8.5 mg/dL — ABNORMAL LOW (ref 8.9–10.3)
Chloride: 96 mmol/L — ABNORMAL LOW (ref 98–111)
Creatinine, Ser: 0.94 mg/dL (ref 0.61–1.24)
GFR, Estimated: >60 mL/min (ref >60)
Glucose, Bld: 157 mg/dL — ABNORMAL HIGH (ref 70–99)
Potassium: 3.6 mmol/L (ref 3.5–5.1)
Sodium: 129 mmol/L — ABNORMAL LOW (ref 135–145)
Total Bilirubin: 1.4 mg/dL — ABNORMAL HIGH (ref 0.3–1.2)
Total Protein: 7.1 g/dL (ref 6.5–8.1)

## 2021-05-07 LAB — CBC WITH DIFFERENTIAL/PLATELET
Abs Immature Granulocytes: 0.06 10*3/uL (ref 0.00–0.07)
Basophils Absolute: 0.1 10*3/uL (ref 0.0–0.1)
Basophils Relative: 0 %
Eosinophils Absolute: 0.1 10*3/uL (ref 0.0–0.5)
Eosinophils Relative: 1 %
HCT: 50.7 % (ref 39.0–52.0)
Hemoglobin: 16.4 g/dL (ref 13.0–17.0)
Immature Granulocytes: 1 %
Lymphocytes Relative: 15 %
Lymphs Abs: 1.8 10*3/uL (ref 0.7–4.0)
MCH: 29.4 pg (ref 26.0–34.0)
MCHC: 32.3 g/dL (ref 30.0–36.0)
MCV: 91 fL (ref 80.0–100.0)
Monocytes Absolute: 1.2 10*3/uL — ABNORMAL HIGH (ref 0.1–1.0)
Monocytes Relative: 10 %
Neutro Abs: 8.9 10*3/uL — ABNORMAL HIGH (ref 1.7–7.7)
Neutrophils Relative %: 73 %
Platelets: 334 10*3/uL (ref 150–400)
RBC: 5.57 MIL/uL (ref 4.22–5.81)
RDW: 13.3 % (ref 11.5–15.5)
WBC: 12.1 10*3/uL — ABNORMAL HIGH (ref 4.0–10.5)
nRBC: 0 % (ref 0.0–0.2)

## 2021-05-07 LAB — ETHANOL: Alcohol, Ethyl (B): <10 mg/dL (ref <10)

## 2021-05-07 LAB — CBG MONITORING, ED: Glucose-Capillary: 265 mg/dL — ABNORMAL HIGH (ref 70–99)

## 2021-05-07 LAB — AMMONIA: Ammonia: 31 umol/L (ref 9–35)

## 2021-05-07 MED ORDER — ACETAMINOPHEN 325 MG PO TABS
650.0000 mg | ORAL_TABLET | Freq: Four times a day (QID) | ORAL | Status: DC | PRN
  Administered 2021-06-01 – 2021-09-17 (×39): 650 mg via ORAL
  Filled 2021-05-07 (×41): qty 2

## 2021-05-07 MED ORDER — INSULIN ASPART 100 UNIT/ML IJ SOLN
0.0000 [IU] | Freq: Three times a day (TID) | INTRAMUSCULAR | Status: DC
  Administered 2021-05-08: 2 [IU] via SUBCUTANEOUS
  Administered 2021-05-08: 1 [IU] via SUBCUTANEOUS
  Administered 2021-05-08: 2 [IU] via SUBCUTANEOUS
  Administered 2021-05-09 (×2): 1 [IU] via SUBCUTANEOUS

## 2021-05-07 NOTE — Progress Notes (Signed)
Patient is still in the waiting room. CSW attached [facility]

## 2021-05-07 NOTE — ED Notes (Signed)
Pt discharged and wheeled out of the ED in a wheel chair without difficulty. 

## 2021-05-07 NOTE — Progress Notes (Signed)
    SUBJECTIVE:   CHIEF COMPLAINT / HPI: Confusion  PERTINENT  PMH / PSH: DM2, HTN, obesity, hyperlipidemia, dementia, food insecurity, homelessness  Pt was seen in the ED on 8/15 for epigastric pain and dysuria with a white blood cell count of 18.6 and a urinary retention of 800 cc.  He was found to have a renal stone and bilateral hydronephrosis with a thickened bladder wall.  He had the Foley and bag placed and was discharged and referred to urology.  Patient states he was never given a phone number for urology and did not understand who urology is or why he needed to get there.  Patient return to the ED on 8/17 for constipation but left.  Return to ED on 8/18 wanting to know what what dripping on him.  The Foley tube was in place but not attached to a bag.  Patient received a new bag and education on the Foley catheter and was discharged.  He returned to the ED on 8/19 stating that he hurt all over but left shortly after.   He then presented to the family medicine clinic today wanting to know what this tube and bag was and could we take it off of him?  He was told several times why he had the Foley catheter but did not comprehend.  He initially stated if we did not take it off of him, he would take it off himself.  He then agreed to leave the Foley catheter in place and go to the ED.  OBJECTIVE:   BP (!) 190/95   Pulse (!) 114   Wt 240 lb 6.4 oz (109 kg)   SpO2 99%   BMI 36.55 kg/m    General: Obese male, NAD, pleasant, able to participate in exam Cardiac: RRR, no murmurs. Respiratory: CTAB, normal effort, No wheezes, rales or rhonchi Abdomen: Bowel sounds present, nontender, suprapubic area feels firm to palpation Genitourinary: Patient has Foley catheter with bag attached collecting reddish tinged fluid Neuro: alert, oriented x3 but confused, does not understand what is going on with him medically Psych: Does not seem concerned or able to understand what is going on medically, does  not have capacity to care for himself  ASSESSMENT/PLAN:   Patient has some confusion at baseline but is acutely more confused today.  After being told several times the purpose of the Foley catheter and bag, he was asked why it was there.  His response was "I don't know".  When asked what would happen if the Foley catheter was removed his response was "I won't be able to breathe and I will die"  Patient states he is currently homeless, could not recall the last time he had eaten, and his legs and feet were covered in feces.  He is clearly unable to care for himself at this time.  He has been contacted by CCM and has a telephone appointment with them on 8/22.  However, he has missed several telephone calls from them in the past.  He will be taken by wheelchair to the ED today.   Dr. Precious Gilding, [place]

## 2021-05-07 NOTE — ED Triage Notes (Addendum)
Pt here after seeing his doctor and leaving, states a short girl brought him to the ED. Pt alert to self, states he doesn't know why he is here. Pt has foley catheter in place w/ hematuria, states he doesn't know how long it has been like that. Asking RN to take it out so he can go home. Pt sent by Dr for AMS/confusion

## 2021-05-07 NOTE — ED Notes (Signed)
Unable to obtain gold top tube, RN made aware.

## 2021-05-07 NOTE — Progress Notes (Signed)
Patient is not in hallway 15 yet. CSW spoke with patient in the waiting room and let him know housing resources were attached to his AVS. Patient asked CSW to move his car and also if he could go get something to eat and come back. CSW stated if he leaves he would lose his spot. Patient complained of being cold and hungry. Patient stated he just needs a place to stay. CSW is unable to place patient into a home and provided resources for him.

## 2021-05-07 NOTE — H&P (Addendum)
[facility] Admission History and Physical Service Pager: [phone]  Patient name: Charles Fox Medical record number: [id number] Date of birth: 1951/06/16 Age: 70 y.o. Gender: male  Primary Care Provider: [name], MD Consultants:  Code Status: FULL  Preferred Emergency Contact: Uncle: Sherrilyn Rist, unable to provide code status   Chief Complaint: AMS   Assessment and Plan: Charles Fox is a 70 y.o. male presenting with AMS and hematuria. PMH is significant for diabetes, hypertension, hyperlipidemia, homelessness, tobacco use disorder.  AMS  likely worsening dementia  Charles Fox is a 70 yr old male who presents with AMS and hematuria.  Limited history from patient as he is a poor historian in addition to not wanting to be in hospital at this time.  Labs on admission: Na 129 (corrected 130), K3.6, glucose 157, creatinine 0.94, WBC 12.1, neuts 8.9.  CT head: neg, MRI brain: neg. Unclear at this time if patient has altered mental status and/or uncooperative behavior.  Most likely differential is worsening of his underlying dementia and this could be his new baseline. He has been seen in Geriatrics clinic at [facility]. [facility] has [facility] in the past. He has struggled with medication management and compliance in the past year according to notes on file. Upon conversation with Charles Fox he does not appear to have capacity to make his own medical decisions. At this time hypoglycemia, trauma, intracranial processes ruled out.  AMS unlikely to be drug-induced as patient does not actively take any medications except metformin and UDS negative. Infectious etiology,  electrolyte disorder (hyponatremia) still viable pending further testing. Will admit for primary social reasons as unsafe disposition. -Admit to med-tele Attending Dr Erin Hearing -Vitals per floor routine  -Up with assistance -Follow-up urine culture, UDS, TSH, vitamin X44, folic  acid -N.p.o. until swallow eval -TOC consult- will need long term placement  -Consider psychiatry evaluation -PT OT eval a.m. 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  Does not follow neurology as an outpatient and pt is not on any antiplatelet therapy. MRI brain: neg -Start ASA 13m after hematuria resolved   Hematuria  Urinary retention  Patient was seen in the ED on 8/15 with complaints of urinary retention x 2-3 days.  A Foley catheter was placed drained 800 cc of urine.  [facility] catheter in place.  Patient reported back to ED on 8/17 but left without complete evaluation.  Patient then reported to ED with Foley catheter in place but without bag.  Received UA showed rare bacteria, RBC greater than 50 and WBC 11-20.  Culture was submitted.  Patient was discharged but then returned few hours later stating he hurts all over and requested his catheter rechecked again.  Patient was then seen in family medicine clinic and was confused as to why the Foley catheter bag was placed.  Foley catheter was found to have hematuria in combination with patient's altered mental status/confusion.  At this time, trauma from Foley catheter is difficult to rule out as patient is poor historian and not medically compliant.  He returns also possibly due to kidney stones seen previously on 8/15.  UTI or cystitis it to be determined by urine culture.  Additionally, patient has been smoking 1 pack a day since the age of 89giving him greater than 50 pack years.  Patient has been nonadherent  to urology consult and at some point should receive cystoscopy as patient has risk factors for bladder cancer.  Creatinine normal at 0.94 at this time and kidney disease unlikely to be contributing to hematuria.  UA showed amber-colored urine with large hemoglobin RBC greater than 50, few bacteria with WBC 21-50 leukocyte  esterase trace nitrite negative.  Patient is hemodynamically stable and afebrile at this time with white blood cell slightly elevated at 12.1.  Infectious causes not ruled out -Follow-up urine culture -Urology referral outpatient -Avoid blood thinners -Continue to monitor Hgb  Chronic mild hyponatremia  Corrected sodium 130 on admission. Baseline is 136-137 however it has dropped over the last week. Pt is non complaint with medications so unlikely to be drug induced. No known intra-cranial pathologies. Does not appear fluid overloaded on exam. Hyponatremia could be contributing to his AMS. -Monitor with BMP  -Consider urine and plasma osmolalities if no improvement   DM2 Last A1c 9.8 on 02/25/2021. CBGs 265 on admission Home meds: Metformin 1062m BID, tresiba 30 units daily. Pt claims he has taken these medications today however per med rec he has not taken these meds in over a month.  -Follow-up A1c -sSSI -Can add long-acting insulin if CBGs persistently high  HTN  BP 154/93 on admission Home meds: losartan 519mdaily, carvedilol 3.12533maily per med rec however patient denies taking these medications in over a month -Restart antihypertensives as an outpatient  HLD Last lipid panel in 2021: Total cholesterol 117, triglycerides 112, HDL 41, LDL 55 Home meds: Atorvastatin 62m4mily denies taking these medications in over a month -Follow-up lipid panel am -Restart atorvastatin as an outpatient  Homelessness Patient reports that he lives in his truck.  He has some family members including his uncle and his children.  He saw his uncle 1 month ago.  He is estranged from the rest of his family.  -TOC consult  Tobacco use disorder  Patient is smoked for the last 50 years, he stopped smoking 1 week ago.  Offered patient nicotine patch however he declined -Smoking cessation counseling  FEN/GI: N.p.o. Prophylaxis: SCDs  Disposition: med tele   History of Present Illness:  Charles NORDBYa 69 y62. male presenting with AMS.  Pt is a poor historian, limited history. Pt is unsure why he is in the hospital. He has no relocation of events today. He wants to leave and sleep in his truck. He does not know why his catheter was put in but had it placed yesterday. Reports pain near his penis. Pt wants to leave tomorrow morning. Denies previous catheters.  Denies eating or drinking today. Has taken his metformin and tresiba today. Denies pain.   Denies ETOH intake, illicit drug use or smoking. Stopped smoking 1 week ago. Started age 62. 55e would like a cold beer. Pt reports he hates his uncle. Last time he saw his uncle was 1 month ago.  Patient is estranged to his children.  Denies any emergency contact.  Review Of Systems: Per HPI with the following additions:   Review of Systems  Respiratory:  Negative for chest tightness and shortness of breath.   Cardiovascular:  Negative for chest pain.  Gastrointestinal:  Negative for abdominal pain and nausea.  Genitourinary:  Positive for difficulty urinating.    Patient Active Problem List   Diagnosis Date Noted   Altered mental status 05/07/2021   Chest pain 02/26/2021   Acute right-sided thoracic back pain 11/27/2020   History of  lacunar cerebrovascular accident 06/11/2020   Dementia without behavioral disturbance ([place]) 05/27/2020   Impaired vision 05/27/2020   Pain, dental 11/20/2019   High priority for COVID-19 virus vaccination 11/20/2019   Chronic pain of both shoulders 09/10/2019   Tendinopathy of left rotator cuff 04/29/2019   Food insecurity 07/09/2018   Skin lesions, generalized 05/09/2018   Hyperlipidemia associated with type 2 diabetes mellitus ([place]) 12/22/2016   Urine test positive for microalbuminuria 05/27/2016   Type 2 diabetes mellitus with neurological complications ([place]) 93/81/0175   Morbid obesity ([place]) 09/04/2014   Hypertension associated with diabetes ([place]) 09/04/2014   Tobacco use disorder  09/04/2014    Past Medical History: Past Medical History:  Diagnosis Date   Cataract    Mixed form OU   Diabetes mellitus without complication ([place])    Diabetic retinopathy ([place])    PDR OU   Hypertension    Hypertensive retinopathy    OU    Past Surgical History: Past Surgical History:  Procedure Laterality Date   BACK SURGERY     FINGER SURGERY      Social History: Social History   Tobacco Use   Smoking status: Every Day    Packs/day: 0.50    Types: Cigarettes   Smokeless tobacco: Never  Vaping Use   Vaping Use: Never used  Substance Use Topics   Alcohol use: No   Drug use: Not Currently   Additional social history:   Please also refer to relevant sections of EMR.  Family History: History reviewed. No pertinent family history. No pertinent family history as is estranged from family.  Allergies and Medications: No Known Allergies No current facility-administered medications on file prior to encounter.   Current Outpatient Medications on File Prior to Encounter  Medication Sig Dispense Refill   Accu-Chek Softclix Lancets lancets Use as instructed 100 each 12   Alcohol Swabs PADS He is to check blood glucose 2-3 times a day 100 each 11   aspirin EC 81 MG tablet Take 1 tablet (81 mg total) by mouth daily. Swallow whole. 90 tablet 3   atorvastatin (LIPITOR) 40 MG tablet Take 1 tablet (40 mg total) by mouth daily. 90 tablet 3   Blood Glucose Monitoring Suppl (ACCU-CHEK AVIVA PLUS) w/Device KIT Check blood glucose before in AM while fasting. 1 kit 0   carvedilol (COREG) 3.125 MG tablet Take 1 tablet (3.125 mg total) by mouth 2 (two) times daily with a meal. 180 tablet 3   dorzolamide-timolol (COSOPT) 22.3-6.8 MG/ML ophthalmic solution Place 1 drop into both eyes 4 (four) times daily. (Patient not taking: Reported on 05/07/2021) 10 mL 3   glucose blood (ACCU-CHEK AVIVA PLUS) test strip Use as instructed 100 each 12   insulin degludec (TRESIBA FLEXTOUCH) 100 UNIT/ML  FlexTouch Pen INJECT 30 UNITS INTO THE SKIN DAILY. 9 mL 11   Insulin Pen Needle 31G X 5 MM MISC USE TO INJECT YOUR INSULIN ONCE DAILY 100 each 3   losartan (COZAAR) 50 MG tablet Take 1 tablet (50 mg total) by mouth at bedtime. 90 tablet 3   metFORMIN (GLUCOPHAGE-XR) 500 MG 24 hr tablet Take 1 tablet (500 mg total) by mouth 2 (two) times daily with a meal. 90 tablet 2   nitroGLYCERIN (NITROSTAT) 0.4 MG SL tablet Place 1 tablet (0.4 mg total) under the tongue every 5 (five) minutes as needed for chest pain. 25 tablet 0    Objective: BP (!) 170/90   Pulse 98   Temp 98.7 F (37.1 C) (Oral)  Resp 19   Ht '5\' 8"'  (1.727 m)   Wt 109 kg   SpO2 97%   BMI 36.54 kg/m   Exam: General: Awake, alert, uncooperative, no acute distress, feces over clothes  Eyes: EOMI, no scleral icterus  ENTM: Moist mucous membranes, poor dentition Neck: Full range of motion, supple, no lymphadenopathy  Cardiovascular: RRR, no murmurs auscultated Respiratory: CTAB, no increased work of breathing, no wheezing or crackles Gastrointestinal: Soft, obese abdomen, normoactive bowel sounds, nontender to palpation, Foley catheter in place MSK: Normal strength and tone Extremities: 2+ radial pulses bilaterally, 1+ pitting edema lower extremities bilaterally R>L Derm: No rashes or lesions noted, brown stains on lower extremities consistent with feces Neuro: Alert and oriented to name, dob, month, location, capable of moving all 4 extremities without difficulty, no focal neurodeficits appreciated Psych: uncooperative,   Labs and Imaging: CBC BMET  Recent Labs  Lab 05/07/21 1929  WBC 12.1*  HGB 16.4  HCT 50.7  PLT 334   Recent Labs  Lab 05/07/21 1929  NA 129*  K 3.6  CL 96*  CO2 23  BUN 9  CREATININE 0.94  GLUCOSE 157*  CALCIUM 8.5*     Drugs of Abuse     Component Value Date/Time   LABOPIA NONE DETECTED 05/07/2021 2334   COCAINSCRNUR NONE DETECTED 05/07/2021 2334   LABBENZ NONE DETECTED 05/07/2021 2334    AMPHETMU NONE DETECTED 05/07/2021 2334   THCU NONE DETECTED 05/07/2021 2334   LABBARB NONE DETECTED 05/07/2021 2334    Urinalysis    Component Value Date/Time   COLORURINE AMBER (A) 05/07/2021 2334   APPEARANCEUR CLOUDY (A) 05/07/2021 2334   LABSPEC 1.014 05/07/2021 2334   PHURINE 6.0 05/07/2021 2334   GLUCOSEU 150 (A) 05/07/2021 2334   HGBUR LARGE (A) 05/07/2021 [place] 05/07/2021 2334   [place] 05/07/2021 2334   PROTEINUR 30 (A) 05/07/2021 2334   UROBILINOGEN 0.2 12/23/2014 2026   NITRITE NEGATIVE 05/07/2021 2334   LEUKOCYTESUR TRACE (A) 05/07/2021 2334   Ammonia: 31 wnl Alcohol: <10  EKG: Sinus tachycardia at 103, normal PR interval and QRS interval, no ST changes or T wave abnormalities CXR: streaky right basilar atelectasis, enlarged cardiac silhouette, no evidence of acute cardiopulmonary disease CT head w/o contrast: No acute intracranial abnormality. Confluent periventricular and subcortical white matter hypoattenuation, most focal in the left frontal lobe, unchanged from prior exam.  Lattie Haw, MD 05/08/2021, 2:24 AM PGY-3 [facility] pager: [phone], text pages welcome

## 2021-05-07 NOTE — ED Triage Notes (Signed)
Patient here with "pain all over" and he thinks his blood sugar is high.  Patient see yesterday for urinary catheter problems.

## 2021-05-07 NOTE — ED Notes (Signed)
Bladder scan 421m

## 2021-05-07 NOTE — ED Notes (Signed)
2 IV attempts made.  [facility] lab to try blood draws.

## 2021-05-07 NOTE — Discharge Instructions (Addendum)
Thank you for letting us care for you during your stay. You were admitted to the [facility] Medicine Teaching Service for an altered mental status and hematuria. You were found to have urinary retention and a catheter was placed to relieve your urine. This catheter is to remain in place until end of the month. A CT scan demonstrated that the posterior aspect of the bladder is thickened, and suspicious for underlying neoplasm. We recommend follow up with Urology about urinary retention and CT findings.  Please follow up with your primary care physician in 06/10/21 at 9:50 am. Please arrive 15 minutes early.  Please follow up with Urology appointment 07/12/21 at 9:15 am. Please arrive 15 minutes early.  If your symptoms worsen or return, please return to the hospital.  Please let us know if you have questions about your stay at [facility].

## 2021-05-07 NOTE — ED Provider Notes (Signed)
[facility] EMERGENCY DEPARTMENT Provider Note   CSN: [id number] Arrival date & time: 05/07/21  1458     History Chief Complaint  Patient presents with   Hematuria   Altered Mental Status    Charles Fox is a 70 y.o. male.  Patient is a 70 year old male with a past medical history of diabetes, HTN, HLD that is presenting from family medicine clinic for altered mental status and hematuria. Patient is not sure why he is in the hospital. He states that he has had blood in his foley catheter for the last two days. He does not know why he has the foley catheter placed and does not know when the catheter was placed. He denies any penile discharge or pain. He has had poor PO intake. He denies any alcohol or drug use.     Hematuria Pertinent negatives include no chest pain, no abdominal pain, no headaches and no shortness of breath.  Altered Mental Status Presenting symptoms: no confusion   Associated symptoms: no abdominal pain, no fever, no headaches, no light-headedness, no nausea, no palpitations, no rash, no seizures and no vomiting       Past Medical History:  Diagnosis Date   Cataract    Mixed form OU   Diabetes mellitus without complication ([place])    Diabetic retinopathy ([place])    PDR OU   Hypertension    Hypertensive retinopathy    OU    Patient Active Problem List   Diagnosis Date Noted   Chest pain 02/26/2021   Acute right-sided thoracic back pain 11/27/2020   History of lacunar cerebrovascular accident 06/11/2020   Dementia without behavioral disturbance ([place]) 05/27/2020   Impaired vision 05/27/2020   Pain, dental 11/20/2019   High priority for COVID-19 virus vaccination 11/20/2019   Chronic pain of both shoulders 09/10/2019   Tendinopathy of left rotator cuff 04/29/2019   Food insecurity 07/09/2018   Skin lesions, generalized 05/09/2018   Hyperlipidemia associated with type 2 diabetes mellitus ([place]) 12/22/2016   Urine test positive for  microalbuminuria 05/27/2016   Type 2 diabetes mellitus with neurological complications ([place]) 26/37/8588   Morbid obesity ([place]) 09/04/2014   Hypertension associated with diabetes ([place]) 09/04/2014   Tobacco use disorder 09/04/2014    Past Surgical History:  Procedure Laterality Date   BACK SURGERY     FINGER SURGERY         History reviewed. No pertinent family history.  Social History   Tobacco Use   Smoking status: Every Day    Packs/day: 0.50    Types: Cigarettes   Smokeless tobacco: Never  Vaping Use   Vaping Use: Never used  Substance Use Topics   Alcohol use: No   Drug use: Not Currently    Home Medications Prior to Admission medications   Medication Sig Start Date End Date Taking? Authorizing Provider  Accu-Chek Softclix Lancets lancets Use as instructed 07/08/20   [name], MD  Alcohol Swabs PADS He is to check blood glucose 2-3 times a day 12/04/17   Mercy Riding, MD  aspirin EC 81 MG tablet Take 1 tablet (81 mg total) by mouth daily. Swallow whole. 10/14/20   [name], MD  atorvastatin (LIPITOR) 40 MG tablet Take 1 tablet (40 mg total) by mouth daily. 10/14/20   [name], MD  Blood Glucose Monitoring Suppl (ACCU-CHEK AVIVA PLUS) w/Device KIT Check blood glucose before in AM while fasting. 07/08/20   [name], MD  carvedilol (COREG) 3.125 MG tablet Take 1 tablet (3.125 mg total) by mouth 2 (two) times daily with a meal. 02/25/21   Wilber Oliphant, MD  dorzolamide-timolol (COSOPT) 22.3-6.8 MG/ML ophthalmic solution Place 1 drop into both eyes 4 (four) times daily. Patient not taking: Reported on 05/07/2021 07/02/20 07/02/21  Bernarda Caffey, MD  glucose blood (ACCU-CHEK AVIVA PLUS) test strip Use as instructed 07/08/20   [name], MD  insulin degludec (TRESIBA FLEXTOUCH) 100 UNIT/ML FlexTouch Pen INJECT 30 UNITS INTO THE SKIN DAILY. 02/24/21   [name], MD  Insulin Pen Needle 31G X 5 MM MISC USE TO INJECT YOUR INSULIN ONCE DAILY 11/27/20  11/27/21  [name], MD  losartan (COZAAR) 50 MG tablet Take 1 tablet (50 mg total) by mouth at bedtime. 11/27/20 11/22/21  Daisy Floro, DO  metFORMIN (GLUCOPHAGE-XR) 500 MG 24 hr tablet Take 1 tablet (500 mg total) by mouth 2 (two) times daily with a meal. 05/05/21   [name], MD  nitroGLYCERIN (NITROSTAT) 0.4 MG SL tablet Place 1 tablet (0.4 mg total) under the tongue every 5 (five) minutes as needed for chest pain. Patient not taking: Reported on 05/07/2021 02/25/21   Wilber Oliphant, MD    Allergies    Patient has no known allergies.  Review of Systems   Review of Systems  Constitutional:  Negative for chills, diaphoresis, fatigue and fever.  HENT:  Negative for congestion, dental problem, ear pain, facial swelling, hearing loss, nosebleeds, postnasal drip, rhinorrhea, sore throat and trouble swallowing.   Eyes:  Negative for photophobia, pain and visual disturbance.  Respiratory:  Negative for apnea, cough, choking, chest tightness, shortness of breath, wheezing and stridor.   Cardiovascular:  Negative for chest pain, palpitations and leg swelling.  Gastrointestinal:  Negative for abdominal distention, abdominal pain, constipation, diarrhea, nausea and vomiting.  Endocrine: Negative for polydipsia and polyuria.  Genitourinary:  Positive for difficulty urinating and hematuria. Negative for dysuria, flank pain, frequency and urgency.  Musculoskeletal:  Negative for gait problem, myalgias, neck pain and neck stiffness.  Skin:  Negative for rash and wound.  Allergic/Immunologic: Negative for environmental allergies and food allergies.  Neurological:  Negative for dizziness, tremors, seizures, syncope, facial asymmetry, speech difficulty, light-headedness, numbness and headaches.  Psychiatric/Behavioral:  Negative for behavioral problems and confusion.   All other systems reviewed and are negative.  Physical Exam Updated Vital Signs BP (!) 153/93 (BP Location: Right Arm)   Pulse  (!) 102   Temp 99 F (37.2 C) (Oral)   Resp 18   Ht '5\' 8"'  (1.727 m)   Wt 109 kg   SpO2 98%   BMI 36.54 kg/m   Physical Exam Vitals and nursing note reviewed. Exam conducted with a chaperone present.  Constitutional:      General: He is not in acute distress.    Appearance: Normal appearance. He is normal weight.  HENT:     Head: Normocephalic and atraumatic.     Right Ear: External ear normal.     Left Ear: External ear normal.     Nose: Nose normal. No congestion.     Mouth/Throat:     Mouth: Mucous membranes are moist.     Pharynx: Oropharynx is clear. No oropharyngeal exudate or posterior oropharyngeal erythema.  Eyes:     General: No visual field deficit.    Extraocular Movements: Extraocular movements intact.     Conjunctiva/sclera: Conjunctivae normal.     Pupils: Pupils are equal, round, and  reactive to light.  Cardiovascular:     Rate and Rhythm: Normal rate and regular rhythm.     Pulses: Normal pulses.     Heart sounds: Normal heart sounds. No murmur heard.   No friction rub. No gallop.  Pulmonary:     Effort: Pulmonary effort is normal. No respiratory distress.     Breath sounds: Normal breath sounds. No stridor. No wheezing, rhonchi or rales.  Chest:     Chest wall: No tenderness.  Abdominal:     General: Abdomen is flat. Bowel sounds are normal. There is no distension.     Palpations: Abdomen is soft.     Tenderness: There is no abdominal tenderness. There is no right CVA tenderness, left CVA tenderness, guarding or rebound.  Genitourinary:    Comments: Foley catheter in place Musculoskeletal:        General: No swelling or tenderness. Normal range of motion.     Cervical back: Normal range of motion and neck supple. No rigidity, tenderness or bony tenderness.     Thoracic back: Normal. No tenderness or bony tenderness.     Lumbar back: Normal. No tenderness or bony tenderness.     Right lower leg: No edema.     Left lower leg: No edema.  Skin:     General: Skin is warm and dry.  Neurological:     General: No focal deficit present.     Mental Status: He is alert. Mental status is at baseline.     Cranial Nerves: Cranial nerves are intact. No cranial nerve deficit, dysarthria or facial asymmetry.     Sensory: Sensation is intact. No sensory deficit.     Motor: Motor function is intact. No weakness.     Coordination: Coordination is intact. Finger-Nose-Finger Test normal.     Gait: Gait is intact. Gait normal.     Comments: Alert and oriented to person and place  Psychiatric:        Mood and Affect: Mood normal.        Behavior: Behavior normal.        Thought Content: Thought content normal.        Judgment: Judgment normal.    ED Results / Procedures / Treatments   Labs (all labs ordered are listed, but only abnormal results are displayed) Labs Reviewed  CBC WITH DIFFERENTIAL/PLATELET  COMPREHENSIVE METABOLIC PANEL  TSH  AMMONIA  URINALYSIS, ROUTINE W REFLEX MICROSCOPIC  RAPID URINE DRUG SCREEN, HOSP PERFORMED  ETHANOL    EKG None  Radiology No results found.  Procedures Procedures   Medications Ordered in ED Medications - No data to display  ED Course  I have reviewed the triage vital signs and the nursing notes.  Pertinent labs & imaging results that were available during my care of the patient were reviewed by me and considered in my medical decision making (see chart for details).    MDM Rules/Calculators/A&P                         Charles Fox is a 70 y.o. male with a past medical history of diabetes, HTN, HLD that is presenting from family medicine clinic for altered mental status and hematuria. Patent is hemodynamically stable and in no acute distress. He is alert or oriented to person and place. He has no focal neuro deficits on exam. He has no changes in motor or sensation.  Patient was seen by his PCP today and was  noted to be more acutely confused than normal. He does not know why he had his  foley catheter placed. He has hematuria in his foley bag. At this time working up patient for acute change in mental status and hematuria.   CXR showed no acute cardiac or pulmonary abnormality. CT head showed no intracranial abnormality.   CBC showed a leukocytosis. CMP showed a sodium of 129. Ammonia was unremarkable. Alcohol and UDS were negative.   Bladder scan was performed and showed a distended bladder. Foley catheter was flushed and replaced.  A total of 1 L of urine returned after new foley placement. UA showed no signs of infection.  Patient will be admitted to family medicine in the setting of unknown cause of altered mental status.  Patient states compliance and understanding of the plan. I explained labs and imaging to the patient. No further questions at this time from the patient.  The plan for this patient was discussed with Dr. Roslynn Amble, who voiced agreement and who oversaw evaluation and treatment of this patient.   Final Clinical Impression(s) / ED Diagnoses Final diagnoses:  Altered mental status, unspecified altered mental status type  Hematuria, unspecified type    Rx / DC Orders ED Discharge Orders     None        Doretha Sou, MD 05/08/21 1203    Lucrezia Starch, MD 05/08/21 1757

## 2021-05-07 NOTE — ED Provider Notes (Signed)
Emergency Medicine Provider Triage Evaluation Note  Charles Fox , a 70 y.o. male  was evaluated in triage.  Pt complains of "i'm hurting all over".  Patient seen in the ED yesterday and discharged less than 3 hours ago.  He is very non-specific with his complaints.  Also complained that blood sugar was high because he got upset.  Also wants his catheter checked again.  Review of Systems  Positive: Bodily pain, hyperglycemia Negative: vomiting  Physical Exam  BP (!) 157/86 (BP Location: Left Arm)   Pulse 99   Temp 98.2 F (36.8 C) (Oral)   Resp 16   SpO2 100%  Gen:   Awake, no distress   Resp:  Normal effort  MSK:   Moves extremities without difficulty  Other:  Catheter in place  Medical Decision Making  Medically screening exam initiated at 2:57 AM.  Appropriate orders placed.  Charles Fox was informed that the remainder of the evaluation will be completed by another provider, this initial triage assessment does not replace that evaluation, and the importance of remaining in the ED until their evaluation is complete.  CBG 260 in triage which appears consistent with prior values.   Larene Pickett, PA-C 05/07/21 [id number]    Veryl Speak, MD 05/07/21 [phone]

## 2021-05-07 NOTE — ED Provider Notes (Signed)
Emergency Medicine Provider Triage Evaluation Note  Charles Fox , a 70 y.o. male  was evaluated in triage.  Pt complains of nothing.  States he is uncertain why he has a Foley catheter.  He states he went to his primary care office today and they refused to take any Foley catheter and I would not tell him why he had it.  Symptoms to the ER.  Review of Systems  Positive: Foley catheter in Negative: Pain  Physical Exam  BP (!) 153/93 (BP Location: Right Arm)   Pulse (!) 102   Temp 99 F (37.2 C) (Oral)   Resp 18   Ht '5\' 8"'$  (1.727 m)   Wt 109 kg   SpO2 98%   BMI 36.54 kg/m  Gen:   Awake, no distress   Resp:  Normal effort  MSK:   Moves extremities without difficulty  Other:  Foley catheter present does have somewhat dark red appearing urine.  Medical Decision Making  Medically screening exam initiated at 3:28 PM.  Appropriate orders placed.  OGHENERUNO SACKSTEDER was informed that the remainder of the evaluation will be completed by another provider, this initial triage assessment does not replace that evaluation, and the importance of remaining in the ED until their evaluation is complete.  I reviewed patient's EMR.  It appears that he has Foley catheter in after he had a Foley catheter placed for urinary retention and had hydronephrosis 8/15 on CT renal stone study.  He is currently homeless and his only complaint is this.  We will consult transition of care team.   Tedd Sias, [place] 05/07/21 1531    [name], MD 05/07/21 [phone]

## 2021-05-07 NOTE — ED Notes (Signed)
Pt requested registration push him outside, pt is no longer seen outside, will wait to see if he returns

## 2021-05-07 NOTE — ED Notes (Signed)
Pt refused to sign MSE waiver, states he wants to go

## 2021-05-08 ENCOUNTER — Inpatient Hospital Stay (HOSPITAL_COMMUNITY): Payer: Medicare HMO

## 2021-05-08 DIAGNOSIS — R4182 Altered mental status, unspecified: Secondary | ICD-10-CM | POA: Diagnosis not present

## 2021-05-08 DIAGNOSIS — R29818 Other symptoms and signs involving the nervous system: Secondary | ICD-10-CM | POA: Diagnosis not present

## 2021-05-08 LAB — BASIC METABOLIC PANEL
Anion gap: 10 (ref 5–15)
BUN: 9 mg/dL (ref 8–23)
CO2: 23 mmol/L (ref 22–32)
Calcium: 8.4 mg/dL — ABNORMAL LOW (ref 8.9–10.3)
Chloride: 99 mmol/L (ref 98–111)
Creatinine, Ser: 0.8 mg/dL (ref 0.61–1.24)
GFR, Estimated: >60 mL/min (ref >60)
Glucose, Bld: 167 mg/dL — ABNORMAL HIGH (ref 70–99)
Potassium: 4.2 mmol/L (ref 3.5–5.1)
Sodium: 132 mmol/L — ABNORMAL LOW (ref 135–145)

## 2021-05-08 LAB — LIPID PANEL
Cholesterol: 159 mg/dL (ref 0–200)
HDL: 38 mg/dL — ABNORMAL LOW (ref >40)
LDL Cholesterol: 111 mg/dL — ABNORMAL HIGH (ref 0–99)
Total CHOL/HDL Ratio: 4.2 RATIO
Triglycerides: 48 mg/dL (ref <150)
VLDL: 10 mg/dL (ref 0–40)

## 2021-05-08 LAB — RAPID URINE DRUG SCREEN, HOSP PERFORMED
Amphetamines: NOT DETECTED
Barbiturates: NOT DETECTED
Benzodiazepines: NOT DETECTED
Cocaine: NOT DETECTED
Opiates: NOT DETECTED
Tetrahydrocannabinol: NOT DETECTED

## 2021-05-08 LAB — CBC
HCT: 45.1 % (ref 39.0–52.0)
Hemoglobin: 14.9 g/dL (ref 13.0–17.0)
MCH: 29.6 pg (ref 26.0–34.0)
MCHC: 33 g/dL (ref 30.0–36.0)
MCV: 89.5 fL (ref 80.0–100.0)
Platelets: 299 10*3/uL (ref 150–400)
RBC: 5.04 MIL/uL (ref 4.22–5.81)
RDW: 13.3 % (ref 11.5–15.5)
WBC: 12.1 10*3/uL — ABNORMAL HIGH (ref 4.0–10.5)
nRBC: 0 % (ref 0.0–0.2)

## 2021-05-08 LAB — URINALYSIS, ROUTINE W REFLEX MICROSCOPIC
Bilirubin Urine: NEGATIVE
Glucose, UA: 150 mg/dL — AB
Ketones, ur: NEGATIVE mg/dL
Nitrite: NEGATIVE
Protein, ur: 30 mg/dL — AB
RBC / HPF: >50 RBC/hpf — ABNORMAL HIGH (ref 0–5)
Specific Gravity, Urine: 1.014 (ref 1.005–1.030)
pH: 6 (ref 5.0–8.0)

## 2021-05-08 LAB — URINE CULTURE: Culture: NO GROWTH

## 2021-05-08 LAB — SARS CORONAVIRUS 2 (TAT 6-24 HRS): SARS Coronavirus 2: NEGATIVE

## 2021-05-08 LAB — VITAMIN B12: Vitamin B-12: 460 pg/mL (ref 180–914)

## 2021-05-08 LAB — CBG MONITORING, ED
Glucose-Capillary: 159 mg/dL — ABNORMAL HIGH (ref 70–99)
Glucose-Capillary: 143 mg/dL — ABNORMAL HIGH (ref 70–99)

## 2021-05-08 LAB — TSH: TSH: 1.038 u[IU]/mL (ref 0.350–4.500)

## 2021-05-08 LAB — GLUCOSE, CAPILLARY
Glucose-Capillary: 154 mg/dL — ABNORMAL HIGH (ref 70–99)
Glucose-Capillary: 151 mg/dL — ABNORMAL HIGH (ref 70–99)

## 2021-05-08 LAB — FOLATE: Folate: 11.7 ng/mL (ref >5.9)

## 2021-05-08 LAB — HEMOGLOBIN A1C
Hgb A1c MFr Bld: 8.9 % — ABNORMAL HIGH (ref 4.8–5.6)
Mean Plasma Glucose: 208.73 mg/dL

## 2021-05-08 MED ORDER — ATORVASTATIN CALCIUM 40 MG PO TABS
40.0000 mg | ORAL_TABLET | Freq: Every day | ORAL | Status: DC
  Administered 2021-05-08 – 2021-09-24 (×140): 40 mg via ORAL
  Filled 2021-05-08 (×140): qty 1

## 2021-05-08 MED ORDER — METFORMIN HCL ER 500 MG PO TB24
500.0000 mg | ORAL_TABLET | Freq: Two times a day (BID) | ORAL | Status: DC
  Administered 2021-05-08 – 2021-06-11 (×65): 500 mg via ORAL
  Filled 2021-05-08 (×67): qty 1

## 2021-05-08 MED ORDER — LOSARTAN POTASSIUM 50 MG PO TABS
50.0000 mg | ORAL_TABLET | Freq: Every day | ORAL | Status: DC
  Administered 2021-05-08 – 2021-05-11 (×4): 50 mg via ORAL
  Filled 2021-05-08 (×4): qty 1

## 2021-05-08 MED ORDER — CHLORHEXIDINE GLUCONATE CLOTH 2 % EX PADS
6.0000 | MEDICATED_PAD | Freq: Every day | CUTANEOUS | Status: DC
  Administered 2021-05-08 – 2021-06-06 (×29): 6 via TOPICAL

## 2021-05-08 MED ORDER — CARVEDILOL 3.125 MG PO TABS
3.1250 mg | ORAL_TABLET | Freq: Two times a day (BID) | ORAL | Status: DC
  Administered 2021-05-08 – 2021-06-11 (×65): 3.125 mg via ORAL
  Filled 2021-05-08 (×66): qty 1

## 2021-05-08 NOTE — ED Notes (Signed)
Patient transported to MRI 

## 2021-05-08 NOTE — ED Notes (Signed)
Patient returned from MRI.

## 2021-05-08 NOTE — ED Notes (Signed)
Pt resting at this time. Visible rise and fall of chest noted. Per previous shift RN, pt refusing cardiac monitor at this time.

## 2021-05-08 NOTE — Progress Notes (Addendum)
Family Medicine Teaching Service Daily Progress Note Intern Pager: [phone]  Patient name: Charles Fox Medical record number: [id number] Date of birth: [birth year] Age: 70 y.o. Gender: male  Primary Care Provider: [name], MD Consultants: [facility]  Code Status: Full Code     [name]:  8/19: admitted for AMS, hematuria 8/20: improved MS, continued hematuria, normal brain MRI   Assessment and Plan: Charles Fox is a 70 y.o. male admitted for AMS, no remarkable head imaging overnight, mental status slightly improved.   AMS in setting of Dementia  Patient appears to have improved, able to answer orientation questions. No fevers overnight. CXR wnl. Patient only shows chronic microvessel dz on head CT. Brain MRI completed this AM without any abnormalities noted. Ammonia and alcohol levels WNL. Vitamin B12 and folate levels within normal limits. WBC mildly increased to 12.1 from 11.2 on 8/17. TSH WNL. Suspect AMS is related to worsening baseline dementia and patient experiencing periods of waxing and waning with decreased short term memory. Patient does not have capacity to leave hospital at this time as he does not demonstrate appropriate insight and judgement into illness or reason for hospitalization, nor can he explain consequences of failing to have adequate treatment. Concerning for safe disposition given reports of homelessness. - monitor for electrolyte abnormalities  - continue neuro checks  - patient to be on cardiac monitoring but declined overnight - if pt becomes agitated overnight recommend risperidone, seroquel and last resort Haldol if combative  - TOC consulted for assistance with living arrangements   Hematuria  CT renal study showed bilateral hydronephrosis, no stones.  No current abx started. Patient noted to have grapefruit juice colored urine in cathether bag this AM. Denies suprapubic pain or abdominal pain. U/A from 8/19 shows large hgb,  trace leuk - will recommend pt for outpatient urology follow up given no symptoms or signs of infection at this time   Homelessness  Currently sleeping in trouble.  Patient reports that he lives on [address]. -appreciate TOC assistance with helping with resources   HTN Continues to be hypertensive this AM with BP 157/100.  - will restart home coreg 3.'125mg'$  BID, losartan '50mg'$    HLD Will restart home atovastatin '40mg'$    T2DM  Blood glucose on AM BMP 167, WNL for hospitalized and acutely ill patient. Only using metformin at home although prescribed tresiba 30 units daily. Hgb A1c 8.9  - continue CBG monitoring  - continue sensitive sliding scale insulin  - restart home metformin '500mg'$  BID    FEN/GI: Heart healthy/carb modified diet PPx: SCDs  Dispo:Pending PT recommendations  in 3 or more days. Barriers include continued medical evaluation for AMS and hematuria.   Subjective:  Patient alert and conversant this AM. Frequently asking when he will be able to leave stating that he feels well, has no pain and is not sure he needs to be here because it is "boring". Patient reports that he would like to go to church on Sunday and has people he would like to visit.  Patient reports he has a son [name] and daughter [name] who are nearby but does not have their telephone numbers.   Objective: Temp:  [98.2 F (36.8 C)-98.7 F (37.1 C)] 98.2 F (36.8 C) (08/20 1223) Pulse Rate:  [79-115] 88 (08/20 1223) Resp:  [17-22] 19 (08/20 1223) BP: (145-170)/(70-102) 145/83 (08/20 1223) SpO2:  [95 %-99 %] 96 % (08/20 1223)  Physical Exam: General: elderly male  lying on hospital stretcher in NAD  Cardiovascular: RRR  Respiratory: CTAB no respiratory distress, stable on RA  Abdomen: nontender, BS present  Extremities: no LE edema   Laboratory: Recent Labs  Lab 05/05/21 1257 05/07/21 1929 05/08/21 0256  WBC 11.2* 12.1* 12.1*  HGB 15.6 16.4 14.9  HCT 46.8 50.7 45.1   PLT 307 334 299   Recent Labs  Lab 05/03/21 0830 05/05/21 1257 05/07/21 1929 05/08/21 0256  NA 134* 133* 129* 132*  K 4.1 3.6 3.6 4.2  CL 99 100 96* 99  CO2 '23 24 23 23  '$ BUN 25* '16 9 9  '$ CREATININE 1.51* 0.80 0.94 0.80  CALCIUM 9.5 9.0 8.5* 8.4*  PROT 8.1 6.9 7.1  --   BILITOT 1.4* 1.3* 1.4*  --   ALKPHOS 130* 92 99  --   ALT '20 20 24  '$ --   AST '29 18 24  '$ --   GLUCOSE 259* 164* 157* 167*     Imaging/Diagnostic Tests: CXR: no abnormalities   Head CT: no acute intracranial abnormalities, chornic small vessel dz   Brain MRI: no acute intracranial abnormalities  Eulis Foster, MD 05/08/2021, 3:18 PM PGY-3, [place] Intern pager: [phone], text pages welcome

## 2021-05-08 NOTE — ED Notes (Signed)
Pt resting comfortably at this time.

## 2021-05-08 NOTE — ED Notes (Addendum)
Indwelling urinary catheter remains patent with pink-tinged urine output. Pt denies pain at this time. Refuses continued cardiac monitoring and vital signs monitoring and says he wants to be left alone while he sleeps.

## 2021-05-08 NOTE — ED Notes (Signed)
Placed pt on cardiac monitor

## 2021-05-08 NOTE — ED Notes (Signed)
Called report room was COVID room and needs to be "zapped". Nurse will call as soon as room is ready.

## 2021-05-09 DIAGNOSIS — R4182 Altered mental status, unspecified: Secondary | ICD-10-CM | POA: Diagnosis not present

## 2021-05-09 LAB — BASIC METABOLIC PANEL
Anion gap: 7 (ref 5–15)
BUN: 9 mg/dL (ref 8–23)
CO2: 25 mmol/L (ref 22–32)
Calcium: 8.3 mg/dL — ABNORMAL LOW (ref 8.9–10.3)
Chloride: 98 mmol/L (ref 98–111)
Creatinine, Ser: 0.88 mg/dL (ref 0.61–1.24)
GFR, Estimated: >60 mL/min (ref >60)
Glucose, Bld: 136 mg/dL — ABNORMAL HIGH (ref 70–99)
Potassium: 3.3 mmol/L — ABNORMAL LOW (ref 3.5–5.1)
Sodium: 130 mmol/L — ABNORMAL LOW (ref 135–145)

## 2021-05-09 LAB — CBC WITH DIFFERENTIAL/PLATELET
Abs Immature Granulocytes: 0.04 10*3/uL (ref 0.00–0.07)
Basophils Absolute: 0.1 10*3/uL (ref 0.0–0.1)
Basophils Relative: 1 %
Eosinophils Absolute: 0.3 10*3/uL (ref 0.0–0.5)
Eosinophils Relative: 2 %
HCT: 42.5 % (ref 39.0–52.0)
Hemoglobin: 14 g/dL (ref 13.0–17.0)
Immature Granulocytes: 0 %
Lymphocytes Relative: 11 %
Lymphs Abs: 1.3 10*3/uL (ref 0.7–4.0)
MCH: 29.4 pg (ref 26.0–34.0)
MCHC: 32.9 g/dL (ref 30.0–36.0)
MCV: 89.1 fL (ref 80.0–100.0)
Monocytes Absolute: 1.1 10*3/uL — ABNORMAL HIGH (ref 0.1–1.0)
Monocytes Relative: 10 %
Neutro Abs: 8.7 10*3/uL — ABNORMAL HIGH (ref 1.7–7.7)
Neutrophils Relative %: 76 %
Platelets: 312 10*3/uL (ref 150–400)
RBC: 4.77 MIL/uL (ref 4.22–5.81)
RDW: 13.2 % (ref 11.5–15.5)
WBC: 11.5 10*3/uL — ABNORMAL HIGH (ref 4.0–10.5)
nRBC: 0 % (ref 0.0–0.2)

## 2021-05-09 LAB — GLUCOSE, CAPILLARY
Glucose-Capillary: 122 mg/dL — ABNORMAL HIGH (ref 70–99)
Glucose-Capillary: 126 mg/dL — ABNORMAL HIGH (ref 70–99)
Glucose-Capillary: 119 mg/dL — ABNORMAL HIGH (ref 70–99)
Glucose-Capillary: 103 mg/dL — ABNORMAL HIGH (ref 70–99)

## 2021-05-09 LAB — RPR: RPR Ser Ql: NONREACTIVE

## 2021-05-09 LAB — HIV ANTIBODY (ROUTINE TESTING W REFLEX): HIV Screen 4th Generation wRfx: NONREACTIVE

## 2021-05-09 MED ORDER — POTASSIUM CHLORIDE CRYS ER 20 MEQ PO TBCR
40.0000 meq | EXTENDED_RELEASE_TABLET | Freq: Once | ORAL | Status: AC
  Administered 2021-05-09: 40 meq via ORAL
  Filled 2021-05-09: qty 2

## 2021-05-09 MED ORDER — NICOTINE 14 MG/24HR TD PT24
14.0000 mg | MEDICATED_PATCH | Freq: Every day | TRANSDERMAL | Status: AC
  Administered 2021-05-09 – 2021-06-05 (×25): 14 mg via TRANSDERMAL
  Filled 2021-05-09 (×29): qty 1

## 2021-05-09 NOTE — Evaluation (Signed)
Physical Therapy Evaluation Patient Details Name: Charles Fox MRN: [id number] DOB: 02-26-1951 Today's Date: 05/09/2021   History of Present Illness  70 yr old male who presents with AMS and hematuria/urinary retention. Seen in ED 8/15 for urinary retention and Foley placed. Returned to ED 8/17 with Foley catheter in place with no bag, culture showed rare bacterira pt discharged and returned a few hours later stating he hurt all over. Seen in family medicine clinic. with Admitted for treatment  05/07/21. PMH is significant for PMH is significant for diabetes, CVA  hypertension, hyperlipidemia, homelessness, tobacco use disorder.  Clinical Impression  PTA pt living in his truck. Pt reports independence with everything. Pt is limited by poor awareness of his current medical issues, in presence of decreased strength and balance. Pt is currently, mod A for bed mobility, min guard for transfers and min A for short distance ambulation refusing RW and using furniture for support instead. Pt would benefit from HHPT, however unlikely with current housing situation. PT will continue to follow acutely.     Follow Up Recommendations Home health PT (realize it is unlikely but pt would benefit)    Equipment Recommendations  Rolling walker with 5" wheels    Recommendations for Other Services OT consult     Precautions / Restrictions Precautions Precautions: Fall Restrictions Weight Bearing Restrictions: No      Mobility  Bed Mobility Overal bed mobility: Needs Assistance Bed Mobility: Supine to Sit     Supine to sit: Mod assist;HOB elevated     General bed mobility comments: able to independently manage LE out of bed and slide down in bed, decreased command follow to reach across to bedrail to assist in pulling to EoB, instead reaches out the therapist and requires modA for him to pull against therapist to come to upright EoB    Transfers Overall transfer level: Needs assistance Equipment  used: None Transfers: Sit to/from Stand Sit to Stand: Min guard         General transfer comment: min guard for power up reaches across to sink to self steady, declines use of RW  Ambulation/Gait Ambulation/Gait assistance: Min assist Gait Distance (Feet): 12 Feet Assistive device: None Gait Pattern/deviations: Step-through pattern;Decreased step length - right;Decreased step length - left;Shuffle;Trunk flexed Gait velocity: slowed Gait velocity interpretation: <1.31 ft/sec, indicative of household ambulator General Gait Details: contact guard assist for safety, pt refuse RW and uses furniture in room for support        Balance Overall balance assessment: Needs assistance Sitting-balance support: Feet supported;No upper extremity supported Sitting balance-Leahy Scale: Fair     Standing balance support: Bilateral upper extremity supported;Single extremity supported;During functional activity Standing balance-Leahy Scale: Poor Standing balance comment: requires single UE support                             Pertinent Vitals/Pain Pain Assessment: Faces Faces Pain Scale: Hurts little more Pain Location: Foley catheter, low back Pain Intervention(s): Limited activity within patient's tolerance;Monitored during session;Repositioned    Home Living Family/patient expects to be discharged to:: Other (Comment)                 Additional Comments: homeless, staying in truck    Prior Function Level of Independence: Independent                  Extremity/Trunk Assessment   Upper Extremity Assessment Upper Extremity Assessment: Overall WFL for tasks assessed  Lower Extremity Assessment Lower Extremity Assessment: Generalized weakness    Cervical / Trunk Assessment Cervical / Trunk Assessment: Other exceptions (low back pain)  Communication   Communication: No difficulties  Cognition Arousal/Alertness: Awake/alert Behavior During Therapy: Flat  affect Overall Cognitive Status: Impaired/Different from baseline Area of Impairment: Safety/judgement;Problem solving                         Safety/Judgement: Decreased awareness of deficits;Decreased awareness of safety   Problem Solving: Slow processing;Requires verbal cues;Requires tactile cues General Comments: decreased awareness of severity of his condition, repeatedly asking to/stating he will leave      General Comments General comments (skin integrity, edema, etc.): Pt with sitter in room. States he is ready to get out of here. Foley catheter in place with reddish urine        Assessment/Plan    PT Assessment Patient needs continued PT services  PT Problem List Decreased strength;Decreased activity tolerance;Decreased balance;Decreased mobility;Decreased coordination;Decreased cognition;Decreased safety awareness;Pain       PT Treatment Interventions DME instruction;Gait training;Functional mobility training;Therapeutic activities;Therapeutic exercise;Balance training;Cognitive remediation;Patient/family education    PT Goals (Current goals can be found in the Care Plan section)  Acute Rehab PT Goals Patient Stated Goal: "get up out of here" PT Goal Formulation: With patient Time For Goal Achievement: 05/23/21 Potential to Achieve Goals: Fair    Frequency Min 3X/week   Barriers to discharge Other (comment) homelessness       AM-PAC PT "6 Clicks" Mobility  Outcome Measure Help needed turning from your back to your side while in a flat bed without using bedrails?: None Help needed moving from lying on your back to sitting on the side of a flat bed without using bedrails?: A Lot Help needed moving to and from a bed to a chair (including a wheelchair)?: A Little Help needed standing up from a chair using your arms (e.g., wheelchair or bedside chair)?: A Little Help needed to walk in hospital room?: A Little Help needed climbing 3-5 steps with a railing?  : A Lot 6 Click Score: 17    End of Session Equipment Utilized During Treatment: Gait belt Activity Tolerance: Patient limited by pain Patient left: in chair;with call bell/phone within reach;with nursing/sitter in room Nurse Communication: Mobility status PT Visit Diagnosis: Unsteadiness on feet (R26.81);Other abnormalities of gait and mobility (R26.89);Muscle weakness (generalized) (M62.81);Difficulty in walking, not elsewhere classified (R26.2);Pain Pain - part of body:  (low back and cathter site)    Time: [id number] PT Time Calculation (min) (ACUTE ONLY): 30 min   Charges:   PT Evaluation $PT Eval Moderate Complexity: 1 Mod PT Treatments $Therapeutic Activity: 8-22 mins        Ellory Khurana B. Migdalia Dk PT, DPT Acute Rehabilitation Services Pager [phone] Office [phone]   [place] 05/09/2021, 10:47 AM

## 2021-05-09 NOTE — Evaluation (Signed)
Clinical/Bedside Swallow Evaluation Patient Details  Name: Charles Fox MRN: [id number] Date of Birth: [birth year]  Today's Date: 05/09/2021 Time: SLP Start Time (ACUTE ONLY): 1208 SLP Stop Time (ACUTE ONLY): 1220 SLP Time Calculation (min) (ACUTE ONLY): 12 min  Past Medical History:  Past Medical History:  Diagnosis Date   Cataract    Mixed form OU   Diabetes mellitus without complication ([place])    Diabetic retinopathy ([place])    PDR OU   Hypertension    Hypertensive retinopathy    OU   Past Surgical History:  Past Surgical History:  Procedure Laterality Date   BACK SURGERY     FINGER SURGERY     HPI:  Pt is a 71 y.o. male who presented the ED on 8/19 with AMS and hematuria. Seen in ED 8/15 for urinary retention and Foley placed. Returned to ED 8/17 with Foley catheter in place with no bag, culture showed rare bacteria pt discharged and returned a few hours later stating he hurt all over. Seen in family medicine clinic and was admitted for treatment 8/19. MRI brain and CXR  negative for acute changes. PMH: diabetes, CVA, dementia, hypertension, hyperlipidemia, homelessness, tobacco use disorder.   Assessment / Plan / Recommendation Clinical Impression  Pt was seen for bedside swallow evaluation and he denied a history of dysphagia. These reports were corroborated by pt's RN and NT who added that the pt has had difficulty with some meats due to his limited dentition. Oral mechanism exam was [facility] and pt presented with four mandibular teeth; pt denied use of dentures. He tolerated all solids and liquids without signs or symptoms of oropharyngeal dysphagia. The possibility of modifying his diet to compensate of his dentition was discussed. However, pt stated that he would rather order softer solids and not have his diet modified at this time since, as was observed during this evaluation, he can eat some regular textures without difficulty. Pt's current diet of regular texture solids and  thin liquids will be continued at this time and further skilled SLP services are not clinically indicated for swallowing. SLP Visit Diagnosis: Dysphagia, unspecified (R13.10)    Aspiration Risk  No limitations    Diet Recommendation Regular;Thin liquid   Liquid Administration via: Cup;Straw Medication Administration: Whole meds with liquid Supervision: Patient able to self feed Postural Changes: Seated upright at 90 degrees    Other  Recommendations Oral Care Recommendations: Oral care BID   Follow up Recommendations None      Frequency and Duration            Prognosis        Swallow Study   General Date of Onset: 05/09/21 HPI: Pt is a 70 y.o. male who presented the ED on 8/19 with AMS and hematuria. Seen in ED 8/15 for urinary retention and Foley placed. Returned to ED 8/17 with Foley catheter in place with no bag, culture showed rare bacteria pt discharged and returned a few hours later stating he hurt all over. Seen in family medicine clinic and was admitted for treatment 8/19. MRI brain and CXR  negative for acute changes. PMH: diabetes, CVA, dementia, hypertension, hyperlipidemia, homelessness, tobacco use disorder. Type of Study: Bedside Swallow Evaluation Previous Swallow Assessment: none Diet Prior to this Study: Regular;Thin liquids Temperature Spikes Noted: No Respiratory Status: Room air History of Recent Intubation: No Behavior/Cognition: Alert;Cooperative (encouragement required for participation) Oral Cavity Assessment: Within Functional Limits Oral Care Completed by SLP: No Oral Cavity - Dentition: Missing dentition (four  mandibular teeth only) Vision: Functional for self-feeding Self-Feeding Abilities: Able to feed self Patient Positioning: Upright in bed;Postural control adequate for testing Baseline Vocal Quality: Normal Volitional Cough: Strong Volitional Swallow: Able to elicit    Oral/Motor/Sensory Function Overall Oral Motor/Sensory Function:  Within functional limits   Ice Chips Ice chips: Within functional limits Presentation: Spoon   Thin Liquid Thin Liquid: Within functional limits Presentation: Straw    Nectar Thick Nectar Thick Liquid: Not tested   Honey Thick Honey Thick Liquid: Not tested   Puree Puree: Not tested (pty refused)   Solid     Solid: Within functional limits Presentation: Self Fed     Maria Coin I. Hardin Negus, [place], [place] Office number [phone] Pager [phone]  Horton Marshall 05/09/2021,12:34 PM

## 2021-05-09 NOTE — Progress Notes (Signed)
FPTS Interim Progress Note  S:Night rounds. Visited patient, he was awake in bed.  He reports no complaints.  RN at bedside says earlier he reported his urinary catheter to be bothering him; she repositioned the catheter attachment on his leg with improvement in his discomfort.  O: BP 129/61 (BP Location: Left Arm)   Pulse 92   Temp 98.1 F (36.7 C) (Oral)   Resp 17   Ht '5\' 8"'$  (1.727 m)   Wt 109 kg   SpO2 92%   BMI 36.54 kg/m   General: Awake, conversational, no acute distress Respiratory: Speaking in full sentences, clear to auscultation bilaterally Cardiac: Regular rate and rhythm, no murmurs appreciated, brisk cap refill, hands warm  A/P: AMS in setting of dementia: Stable Stable at this time.  Delirium precautions in place.  He is currently pleasant and conversational without distress or agitation; no as needed medications indicated at this time.  Hematuria: Stable Urine in Foley bag bright red, per RN at bedside this is his normal since admission.  Nurse to [place] later tonight.  No changes to management at this time, patient to receive outpatient urology referral prior to discharge.  Homelessness Per RN at bedside, patient has been living in his truck for the last week after "getting kicked out of the RV park" recently for unknown reasons.  Nurse reports patient says he has a nice truck.   Hypertension BP at 2327 139/61 with pulse 92.  Patient currently on home regimen of Coreg 3.125 mg twice daily and losartan 50 mg nightly.  No changes at this time.  Ezequiel Essex, MD 05/09/2021, 1:03 AM PGY-2, [place] [facility] pager [phone]

## 2021-05-09 NOTE — Progress Notes (Addendum)
Interim Progress Note  Montreal Cognitive Assessment (MoCA) tool used to assess for mental capacity. Patient scored 13 out of 30, indicating moderate cognitive impairment.  Patient specifically struggled with visuospacial/executive, immediate and delayed recall, attention, and abstraction. The MoCA scoring sheet was uploaded to his chart and can be found under the Media tab. Given his cognitive impairment, I do not feel this patient has capacity to make his own medical decisions at this time.   ADDENDUM: Patient's score is actually 12/30 (still moderate cognitive impairment). He scored 5/6 for orientation- did not say correct month.

## 2021-05-09 NOTE — Progress Notes (Signed)
FPTS Brief Progress Note  S Saw patient at bedside this evening. Patient was sleeping comfortably. I did not wake the patient. No concerns from night RN.   O: BP (!) 143/77 (BP Location: Right Arm)   Pulse 81   Temp 99.2 F (37.3 C) (Oral)   Resp 18   Ht '5\' 8"'$  (1.727 m)   Wt 110.1 kg   SpO2 97%   BMI 36.91 kg/m    General: sleeping comfortably  A/P: Plan per day team  - Orders reviewed. Labs for AM ordered, which was adjusted as needed.  - If condition changes, plan includes Risperidone/Seroquel if pt becomes agitated.   Lattie Haw, MD 05/09/2021, 10:07 PM PGY-3, [facility] Night Resident  Please page [phone] with questions.

## 2021-05-09 NOTE — Consult Note (Signed)
[facility] Face-to-Face Psychiatry Consult   Reason for Consult:  Referring Physician: ''Full psych evaluation and evaluation for capacity. Depression/PTSD, reporting passive SI this morning as well.'' Patient Identification: Charles Fox MRN:  [id number] Principal Diagnosis: Altered mental status Diagnosis:  Principal Problem:   Altered mental status   Total Time spent with patient: 45 minutes  Subjective:   Charles Fox is a 70 y.o. male patient admitted with altered mental status and hematuria.  HPI:  Patient is 70 yr old male who denies any prior history of mental illness,PMH is significant for diabetes, CVA  hypertension, hyperlipidemia, homelessness and tobacco use. He was admitted to the hospital due to altered mental status, urinary retention and hematuria. Of note is history of Dementia mentioned in patient chart but patient denies memory impairment and or prior evaluation by a neurologist. Today, he is alert, awake, oriented, cooperative , he scored 27/30 on mini-mental status examination. Patient denies anxiety, depression, psychosis, delusions and self harming thoughts. 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 He states that he currently live in his truck and not interested in living in a shelter but willing to talk to social worker for a better living arrangement. Today, patient answers all the questions, able to communicate a choice-prefers to live in his truck or better living arrangement but not shelter. He demonstrated understanding of his choice and shows appreciation for the provider concern about his current living condition. Base on my evaluation today, there is no evidence that he lacks capacity to make his own decision. However, it should noted that capacity changes from time to time and patient may benefit from competency determination which is more objective.  Past Psychiatric History:  None reported  Risk to Self:  denies Risk to Others:  denies Prior Inpatient Therapy:  denies Prior Outpatient Therapy:  denies  Past Medical History:  Past Medical History:  Diagnosis Date   Cataract    Mixed form OU   Diabetes mellitus without complication ([place])    Diabetic retinopathy ([place])    PDR OU   Hypertension    Hypertensive retinopathy    OU    Past Surgical History:  Procedure Laterality Date   BACK SURGERY     FINGER SURGERY     Family History: History reviewed. No pertinent family history. Family Psychiatric  History:  Social History:  Social History   Substance and Sexual Activity  Alcohol Use No     Social History   Substance and Sexual Activity  Drug Use Not Currently    Social History   Socioeconomic History   Marital status: Widowed    Spouse name: Not on file   Number of children: 1   Years of education: 54   Highest education level: High school graduate  Occupational History   Not on file  Tobacco Use   Smoking status: Every Day    Packs/day: 0.50    Types: Cigarettes   Smokeless tobacco: Never  Vaping Use   Vaping Use: Never used  Substance and Sexual Activity   Alcohol use: No   Drug use: Not Currently   Sexual activity: Not Currently  Other Topics Concern   Not on file  Social History Narrative   Food insecurities - referred to [employer] for 7 diabetic, low sodium meals per week, for a total of 12 weeks, beginning on Friday, July 31st.  Patient was also  provided [employer].      Patient continues to states has no family, friends or support system.  States he has 3 children but does not know how to contact them.  Was able to provide the following information today   Son- Sharyn Blitz lives in [place] lives in [place] lives in [place]   Does not associate with neighbors   Goes to [employer] on [address]. Each Wed.  But states has no friends there    Social Determinants of Radio broadcast assistant Strain: Not on file  Food Insecurity: Food Insecurity Present   Worried About Charity fundraiser in the Last Year: Sometimes true   Arboriculturist in the Last Year: Sometimes true  Transportation Needs: Not on file  Physical Activity: Not on file  Stress: Not on file  Social Connections: Not on file   Additional Social History:    Allergies:  No Known Allergies  Labs:  Results for orders placed or performed during the hospital encounter of 05/07/21 (from the past 48 hour(s))  CBC with Differential     Status: Abnormal   Collection Time: 05/07/21  7:29 PM  Result Value Ref Range   WBC 12.1 (H) 4.0 - 10.5 K/uL   RBC 5.57 4.22 - 5.81 MIL/uL   Hemoglobin 16.4 13.0 - 17.0 g/dL   HCT 50.7 39.0 - 52.0 %   MCV 91.0 80.0 - 100.0 fL   MCH 29.4 26.0 - 34.0 pg   MCHC 32.3 30.0 - 36.0 g/dL   RDW 13.3 11.5 - 15.5 %   Platelets 334 150 - 400 K/uL   nRBC 0.0 0.0 - 0.2 %   Neutrophils Relative % 73 %   Neutro Abs 8.9 (H) 1.7 - 7.7 K/uL   Lymphocytes Relative 15 %   Lymphs Abs 1.8 0.7 - 4.0 K/uL   Monocytes Relative 10 %   Monocytes Absolute 1.2 (H) 0.1 - 1.0 K/uL   Eosinophils Relative 1 %   Eosinophils Absolute 0.1 0.0 - 0.5 K/uL   Basophils Relative 0 %   Basophils Absolute 0.1 0.0 - 0.1 K/uL   Immature Granulocytes 1 %   Abs Immature Granulocytes 0.06 0.00 - 0.07 K/uL    Comment: Performed at [facility], [address], [place], [place] [address]  Comprehensive metabolic panel     Status: Abnormal   Collection Time: 05/07/21  7:29 PM  Result Value Ref Range   Sodium 129 (L) 135 - 145 mmol/L   Potassium 3.6 3.5 - 5.1 mmol/L   Chloride 96 (L) 98 - 111 mmol/L   CO2 23 22 - 32 mmol/L   Glucose, Bld 157 (H) 70 - 99 mg/dL    Comment: Glucose reference range applies only to samples taken after fasting for at least 8 hours.   BUN 9 8 - 23 mg/dL   Creatinine, Ser 0.94 0.61 - 1.24 mg/dL   Calcium 8.5 (L) 8.9 - 10.3 mg/dL    Total Protein 7.1 6.5 - 8.1 g/dL   Albumin 3.1 (L) 3.5 - 5.0 g/dL   AST 24 15 - 41 U/L   ALT 24 0 - 44 U/L   Alkaline Phosphatase 99 38 - 126 U/L   Total Bilirubin 1.4 (H) 0.3 - 1.2 mg/dL   GFR, Estimated >60 >60 mL/min    Comment: (NOTE) Calculated using the CKD-EPI Creatinine Equation (2021)  Anion gap 10 5 - 15    Comment: Performed at [facility] [address] [place], [place] [address]  Ammonia     Status: None   Collection Time: 05/07/21  7:29 PM  Result Value Ref Range   Ammonia 31 9 - 35 umol/L    Comment: Performed at [facility], [place] [address] [place], [place] [address]  Ethanol     Status: None   Collection Time: 05/07/21  7:29 PM  Result Value Ref Range   Alcohol, Ethyl (B) <10 <10 mg/dL    Comment: Performed at [facility] [address] [place], [place] [address]  Urinalysis, Routine w reflex microscopic Urine, Catheterized     Status: Abnormal   Collection Time: 05/07/21 11:34 PM  Result Value Ref Range   Color, Urine AMBER (A) YELLOW    Comment: BIOCHEMICALS MAY BE AFFECTED BY COLOR   APPearance CLOUDY (A) CLEAR   Specific Gravity, Urine 1.014 1.005 - 1.030   pH 6.0 5.0 - 8.0   Glucose, UA 150 (A) NEGATIVE mg/dL   Hgb urine dipstick LARGE (A) NEGATIVE   Bilirubin Urine NEGATIVE NEGATIVE   Ketones, ur NEGATIVE NEGATIVE mg/dL   Protein, ur 30 (A) NEGATIVE mg/dL   Nitrite NEGATIVE NEGATIVE   Leukocytes,Ua TRACE (A) NEGATIVE   RBC / HPF >50 (H) 0 - 5 RBC/hpf   WBC, UA 21-50 0 - 5 WBC/hpf   Bacteria, UA FEW (A) NONE SEEN   Squamous Epithelial / LPF 0-5 0 - 5   Mucus PRESENT     Comment: Performed at [facility], [address], [place], [place] [address]  Rapid urine drug screen (hospital performed)     Status: None   Collection Time: 05/07/21 11:34 PM  Result Value Ref Range   Opiates NONE DETECTED NONE DETECTED   Cocaine NONE DETECTED NONE DETECTED   Benzodiazepines NONE DETECTED NONE DETECTED   Amphetamines NONE  DETECTED NONE DETECTED   Tetrahydrocannabinol NONE DETECTED NONE DETECTED   Barbiturates NONE DETECTED NONE DETECTED    Comment: (NOTE) DRUG SCREEN FOR MEDICAL PURPOSES ONLY.  IF CONFIRMATION IS NEEDED FOR ANY PURPOSE, NOTIFY LAB WITHIN 5 DAYS.  LOWEST DETECTABLE LIMITS FOR URINE DRUG SCREEN Drug Class                     Cutoff (ng/mL) Amphetamine and metabolites    1000 Barbiturate and metabolites    200 Benzodiazepine                 [id number] Tricyclics and metabolites     300 Opiates and metabolites        300 Cocaine and metabolites        300 THC                            50 Performed at [facility], [place] [address] [place], [place] [address]   SARS CORONAVIRUS 2 (TAT 6-24 HRS) Nasopharyngeal Nasopharyngeal Swab     Status: None   Collection Time: 05/08/21  2:31 AM   Specimen: Nasopharyngeal Swab  Result Value Ref Range   SARS Coronavirus 2 NEGATIVE NEGATIVE    Comment: (NOTE) SARS-CoV-2 target nucleic acids are NOT DETECTED.  The SARS-CoV-2 RNA is generally detectable in upper and lower respiratory specimens during the acute phase of infection. Negative results do not preclude SARS-CoV-2 infection, do not rule out  co-infections with other pathogens, and should not be used as the sole basis for treatment or other patient management decisions. Negative results must be combined with clinical observations, patient history, and epidemiological information. The expected result is Negative.  Fact Sheet for Patients: SugarRoll.be  Fact Sheet for Healthcare Providers: https://www.woods-mathews.com/  This test is not yet approved or cleared by the [place] FDA and  has been authorized for detection and/or diagnosis of SARS-CoV-2 by FDA under an Emergency Use Authorization (EUA). This EUA will remain  in effect (meaning this test can be used) for the duration of the COVID-19 declaration under Se ction 564(b)(1) of the Act,  21 U.S.C. section 360bbb-3(b)(1), unless the authorization is terminated or revoked sooner.  Performed at [facility], [place] [address] [place], [place] [address]   CBC     Status: Abnormal   Collection Time: 05/08/21  2:56 AM  Result Value Ref Range   WBC 12.1 (H) 4.0 - 10.5 K/uL   RBC 5.04 4.22 - 5.81 MIL/uL   Hemoglobin 14.9 13.0 - 17.0 g/dL   HCT 45.1 39.0 - 52.0 %   MCV 89.5 80.0 - 100.0 fL   MCH 29.6 26.0 - 34.0 pg   MCHC 33.0 30.0 - 36.0 g/dL   RDW 13.3 11.5 - 15.5 %   Platelets 299 150 - 400 K/uL   nRBC 0.0 0.0 - 0.2 %    Comment: Performed at [facility], [place] [address] [place], [place] [id number]  Basic metabolic panel     Status: Abnormal   Collection Time: 05/08/21  2:56 AM  Result Value Ref Range   Sodium 132 (L) 135 - 145 mmol/L   Potassium 4.2 3.5 - 5.1 mmol/L    Comment: SLIGHT HEMOLYSIS   Chloride 99 98 - 111 mmol/L   CO2 23 22 - 32 mmol/L   Glucose, Bld 167 (H) 70 - 99 mg/dL    Comment: Glucose reference range applies only to samples taken after fasting for at least 8 hours.   BUN 9 8 - 23 mg/dL   Creatinine, Ser 0.80 0.61 - 1.24 mg/dL   Calcium 8.4 (L) 8.9 - 10.3 mg/dL   GFR, Estimated >60 >60 mL/min    Comment: (NOTE) Calculated using the CKD-EPI Creatinine Equation (2021)    Anion gap 10 5 - 15    Comment: Performed at [facility] [address] [place], [place] [address]  Vitamin B12     Status: None   Collection Time: 05/08/21  2:56 AM  Result Value Ref Range   Vitamin B-12 460 180 - 914 pg/mL    Comment: (NOTE) This assay is not validated for testing neonatal or myeloproliferative syndrome specimens for Vitamin B12 levels. Performed at [facility], [place] [address] [place], [place] [address]   TSH     Status: None   Collection Time: 05/08/21  2:57 AM  Result Value Ref Range   TSH 1.038 0.350 - 4.500 uIU/mL    Comment: Performed by a 3rd Generation assay with a functional sensitivity of <=0.01 uIU/mL. Performed  at [facility], [place] [address], [place], [place] [address]   Folate, serum, performed at [facility] lab     Status: None   Collection Time: 05/08/21  2:57 AM  Result Value Ref Range   Folate 11.7 >5.9 ng/mL    Comment: Performed at [facility], [place] [address], [place],  [address]  Lipid panel  Status: Abnormal   Collection Time: 05/08/21  2:58 AM  Result Value Ref Range   Cholesterol 159 0 - 200 mg/dL   Triglycerides 48 <150 mg/dL   HDL 38 (L) >40 mg/dL   Total CHOL/HDL Ratio 4.2 RATIO   VLDL 10 0 - 40 mg/dL   LDL Cholesterol 111 (H) 0 - 99 mg/dL    Comment:        Total Cholesterol/HDL:CHD Risk Coronary Heart Disease Risk Table                     Men   Women  1/2 Average Risk   3.4   3.3  Average Risk       5.0   4.4  2 X Average Risk   9.6   7.1  3 X Average Risk  23.4   11.0        Use the calculated Patient Ratio above and the CHD Risk Table to determine the patient's CHD Risk.        ATP III CLASSIFICATION (LDL):  <100     mg/dL   Optimal  100-129  mg/dL   Near or Above                    Optimal  130-159  mg/dL   Borderline  160-189  mg/dL   High  >190     mg/dL   Very High Performed at [facility] [address] [place], [place] [address]   Hemoglobin A1c     Status: Abnormal   Collection Time: 05/08/21  8:17 AM  Result Value Ref Range   Hgb A1c MFr Bld 8.9 (H) 4.8 - 5.6 %    Comment: (NOTE) Pre diabetes:          5.7%-6.4%  Diabetes:              >6.4%  Glycemic control for   <7.0% adults with diabetes    Mean Plasma Glucose 208.73 mg/dL    Comment: Performed at [facility] [address], [place], [place] [address]  CBG monitoring, ED     Status: Abnormal   Collection Time: 05/08/21  8:34 AM  Result Value Ref Range   Glucose-Capillary 159 (H) 70 - 99 mg/dL    Comment: Glucose reference range applies only to samples taken after fasting for at least 8 hours.  CBG monitoring, ED     Status: Abnormal    Collection Time: 05/08/21 12:18 PM  Result Value Ref Range   Glucose-Capillary 143 (H) 70 - 99 mg/dL    Comment: Glucose reference range applies only to samples taken after fasting for at least 8 hours.  Glucose, capillary     Status: Abnormal   Collection Time: 05/08/21  5:07 PM  Result Value Ref Range   Glucose-Capillary 154 (H) 70 - 99 mg/dL    Comment: Glucose reference range applies only to samples taken after fasting for at least 8 hours.  Glucose, capillary     Status: Abnormal   Collection Time: 05/08/21  9:00 PM  Result Value Ref Range   Glucose-Capillary 151 (H) 70 - 99 mg/dL    Comment: Glucose reference range applies only to samples taken after fasting for at least 8 hours.  Basic metabolic panel     Status: Abnormal   Collection Time: 05/09/21  1:05 AM  Result Value Ref Range   Sodium 130 (L) 135 - 145 mmol/L   Potassium  3.3 (L) 3.5 - 5.1 mmol/L   Chloride 98 98 - 111 mmol/L   CO2 25 22 - 32 mmol/L   Glucose, Bld 136 (H) 70 - 99 mg/dL    Comment: Glucose reference range applies only to samples taken after fasting for at least 8 hours.   BUN 9 8 - 23 mg/dL   Creatinine, Ser 0.88 0.61 - 1.24 mg/dL   Calcium 8.3 (L) 8.9 - 10.3 mg/dL   GFR, Estimated >60 >60 mL/min    Comment: (NOTE) Calculated using the CKD-EPI Creatinine Equation (2021)    Anion gap 7 5 - 15    Comment: Performed at [facility] [address], [place], [place] [address]  CBC with Differential/Platelet     Status: Abnormal   Collection Time: 05/09/21  1:05 AM  Result Value Ref Range   WBC 11.5 (H) 4.0 - 10.5 K/uL   RBC 4.77 4.22 - 5.81 MIL/uL   Hemoglobin 14.0 13.0 - 17.0 g/dL   HCT 42.5 39.0 - 52.0 %   MCV 89.1 80.0 - 100.0 fL   MCH 29.4 26.0 - 34.0 pg   MCHC 32.9 30.0 - 36.0 g/dL   RDW 13.2 11.5 - 15.5 %   Platelets 312 150 - 400 K/uL   nRBC 0.0 0.0 - 0.2 %   Neutrophils Relative % 76 %   Neutro Abs 8.7 (H) 1.7 - 7.7 K/uL   Lymphocytes Relative 11 %   Lymphs Abs 1.3 0.7 - 4.0 K/uL    Monocytes Relative 10 %   Monocytes Absolute 1.1 (H) 0.1 - 1.0 K/uL   Eosinophils Relative 2 %   Eosinophils Absolute 0.3 0.0 - 0.5 K/uL   Basophils Relative 1 %   Basophils Absolute 0.1 0.0 - 0.1 K/uL   Immature Granulocytes 0 %   Abs Immature Granulocytes 0.04 0.00 - 0.07 K/uL    Comment: Performed at [facility] [address], [place], [place] [address]  Glucose, capillary     Status: Abnormal   Collection Time: 05/09/21  8:12 AM  Result Value Ref Range   Glucose-Capillary 122 (H) 70 - 99 mg/dL    Comment: Glucose reference range applies only to samples taken after fasting for at least 8 hours.  HIV Antibody (routine testing w rflx)     Status: None   Collection Time: 05/09/21  8:22 AM  Result Value Ref Range   HIV Screen 4th Generation wRfx Non Reactive Non Reactive    Comment: Performed at [facility], [place] [address] [place], [place] [address]  RPR     Status: None   Collection Time: 05/09/21  8:22 AM  Result Value Ref Range   RPR Ser Ql NON REACTIVE NON REACTIVE    Comment: Performed at [facility] [address] [place],  [address]    Current Facility-Administered Medications  Medication Dose Route Frequency Provider Last Rate Last Admin   acetaminophen (TYLENOL) tablet 650 mg  650 mg Oral Q6H PRN Lattie Haw, MD       atorvastatin (LIPITOR) tablet 40 mg  40 mg Oral Daily Simmons-Robinson, Makiera, MD   40 mg at 05/09/21 0819   carvedilol (COREG) tablet 3.125 mg  3.125 mg Oral BID WC Simmons-Robinson, Makiera, MD   3.125 mg at 05/09/21 [id number]   Chlorhexidine Gluconate Cloth 2 % PADS 6 each  6 each Topical Daily Lind Covert, MD   6 each at 05/09/21 1114   insulin aspart (  novoLOG) injection 0-9 Units  0-9 Units Subcutaneous TID WC Lattie Haw, MD   1 Units at 05/09/21 0819   losartan (COZAAR) tablet 50 mg  50 mg Oral QHS Simmons-Robinson, Makiera, MD   50 mg at 05/08/21 2307   metFORMIN (GLUCOPHAGE-XR) 24 hr tablet 500 mg  500 mg  Oral BID WC Simmons-Robinson, Makiera, MD   500 mg at 05/09/21 [id number]    Musculoskeletal: Strength & Muscle Tone: within normal limits Gait & Station:  not tested Patient leans: N/A            Psychiatric Specialty Exam:  Presentation  General Appearance: Appropriate for Environment; Casual  Eye Contact:Good  Speech:Normal Rate  Speech Volume:Normal  Handedness:Right   Mood and Affect  Mood:Euthymic  Affect:Appropriate   Thought Process  Thought Processes:Coherent; Linear  Descriptions of Associations:Intact  Orientation:Full (Time, Place and Person)  Thought Content:Logical  History of Schizophrenia/Schizoaffective disorder:No data recorded Duration of Psychotic Symptoms:No data recorded Hallucinations:Hallucinations: None  Ideas of Reference:None  Suicidal Thoughts:Suicidal Thoughts: No  Homicidal Thoughts:Homicidal Thoughts: No   Sensorium  Memory:Immediate Good; Recent Good; Remote Good  Judgment:Fair  Insight:Shallow   Executive Functions  Concentration:Good  Attention Span:Good  [place]   Psychomotor Activity  Psychomotor Activity:Psychomotor Activity: Normal   Assets  Assets:Communication Skills; Desire for Improvement   Sleep  Sleep:Sleep: Fair   Physical Exam: Physical Exam ROS Blood pressure 136/61, pulse 75, temperature 99 F (37.2 C), temperature source Oral, resp. rate 16, height '5\' 8"'$  (1.727 m), weight 110.1 kg, SpO2 97 %. Body mass index is 36.91 kg/m.  Treatment Plan Summary: 70 year old homeless man who denies prior history of mental illness, admitted due to altered mental status, urinary retention and hematuria. Patient is alert, awake, oriented, able to answer all the questions, able to communicate a choice-prefers to live in his truck or better living arrangement if it can be provided but not shelter. He demonstrated understanding of his choice, shows  appreciation for the provider concern about his current living condition and able to rationalized the need for him to talk to a social worker to help with housing. He score 27/30 on mini-mental status. Base on my evaluation today, there is no evidence that he lacks capacity to make his own decision. However, it should noted that capacity changes from time to time and patient may benefit from competency determination which is more objective.  Recommendations: -May benefit from Neurology referral for full Dementia evaluation -Continue Delirium precautions -TOC/PT/OT evaluation and recommendation  -If agitated: Low dose Risperidone, Seroquel will be helpful. Haldol only if combative   Disposition: No evidence of imminent risk to self or others at present.   Patient does not meet criteria for psychiatric inpatient admission. Supportive therapy provided about ongoing stressors. Psychiatric service signing off. Re-consult as needed  Corena Pilgrim, MD 05/09/2021 11:37 AM

## 2021-05-09 NOTE — Care Management CC44 (Signed)
Condition Code 44 Documentation Completed  Patient Details  Name: Charles Fox MRN: [id number] Date of Birth: December 03, 1950  Per assessment of Resident 8/21 15:25, patient does not have capacity to make decisions. Therefore; code 35 not completed with patient, no family on file to contact.  Condition Code 44 given:    Patient signature on Condition Code 44 notice:    Documentation of 2 MD's agreement:    Code 44 added to claim:       Carles Collet, RN 05/09/2021, 4:27 PM

## 2021-05-09 NOTE — Progress Notes (Signed)
Family Medicine Teaching Service Daily Progress Note Intern Pager: [phone]  Patient name: Charles Fox Medical record number: [id number] Date of birth: 23-Feb-1951 Age: 70 y.o. Gender: male  Primary Care Provider: [name], MD Consultants: None Code Status: FULL CODE  [name]:  8/19: Admitted for AMS, hematuria 8/20: Improved mentation, continued hematuria, normal brain MRI   Assessment and Plan: HUY KRIES is a 70 y.o. male admitted for AMS, no remarkable head imaging overnight, mental status slightly improved.   Altered Mental Status, Concerning for Worsening Dementia Work-up thus far has been unremarkable with normal chest x-ray, nonacute CT head and brain MRI findings, ammonia, ethanol, vitamin B12, TSH, and folate levels normal. UDS negative. Patient has a history of dementia, this could represent a decline, however, during my encounter patients mental status appeared improved. He was alert and oriented to person, place, time though situation was unclear. He told he he was here because "my diabetes isn't doing too good, I guess".  Would appreciate Psychiatry's assistance in determining capacity. Will also await TOC/PT/OT recommendations for placement.   -Check other reversible causes of dementia: HIV, RPR -Delirium precautions -Await PT/OT recommendations -If agitated: Risperidone, seroquel. Haldol only if combative.  -TOC consulted  -Psych consult for capacity   Passive Suicidal Ideations Reported passive suicidal ideations this morning. Reports he has lost most of his family members and does not feel he has reasons to continue to live. He does have children and has intermittent contact with them but does not want [place] to reach out to them. He also reports he lost his house and has been living in his truck. He tells me about some traumatic events in his life including his wife passing, and finding both of his parents dead.   -Psych consult,  appreciate care and recommendations -Suicide precautions -1:1 sitter  Hematuria  UA with greater than 50 RBCs, large hemoglobin.  Few bacteria with trace leukocytes, nitrite negative. CT renal study with b/l hydronephrosis, no stones. Patient had Foley catheter placed on 8/15 for urinary obstruction.  He has had several ED visits since and has been confused about why catheter had been placed. He was previously referred to [facility] outpatient but given his dementia, he was unable to schedule appointment. Had 1.7L urine output yesterday. Afebrile, asymptomatic and urine culture without growth. -Monitor; will need assistance with arranging/transportation for  outpatient Urology   Hypokalemia K 3.3 this AM. -Replete with 40 mEq   Hypertension BP's ranging  -Continue coreg 3.125 mg BID  -Continue Losartan 50 mg daily at bedtime   Hyperlipidemia  Lipid panel obtained this admission. LDL slightly elevated at 111.  -Continue atorvastatin 40 mg daily   Type 2 Diabetes Mellitus  Hemoglobin A1c 8.9. -Continue Metformin 500 mg BID -sensitive SSI   Housing Insecurity  Reportedly has been living in his truck. Given his dementia, he does not have capacity.  -TOC consulted     FEN/GI: Heart Healthy/Carb modified  PPx: SCD's Dispo:Pending PT recommendations  in 2-3 days. Barriers include additional workup, PT recommendations, will likely need SNF placement.   Subjective:  Charles Fox feels that he is doing better today. He denies any chest pain, abdominal pain, dysuria or shortness of breath. He states that he is here because his diabetes "isn't doing so good". He tells me that he has had thoughts about ending his life. He has no plan for this. He just feels that there is no reason for him to  keep going and feels there is no help for him. He denies any homicidal thoughts.  Objective: Temp:  [97.6 F (36.4 C)-98.9 F (37.2 C)] 98.9 F (37.2 C) (08/21 0510) Pulse Rate:  [79-93] 83  (08/21 0510) Resp:  [15-22] 15 (08/21 0510) BP: (125-164)/(52-83) 141/52 (08/21 0510) SpO2:  [92 %-97 %] 96 % (08/21 0510) Weight:  [110.1 kg] 110.1 kg (08/21 0510) Physical Exam: General: Alert and oriented to person, place and time but not situation Cardiovascular: RRR without murmur Respiratory: CTAB without wheezing/rhonchi/rales Abdomen: soft, non-tender in all quadrants, no rebound/guarding Extremities: No edema, 2+ DP/radial pulses b/l  Psych: Depressed affect  Laboratory: Recent Labs  Lab 05/07/21 1929 05/08/21 0256 05/09/21 0105  WBC 12.1* 12.1* 11.5*  HGB 16.4 14.9 14.0  HCT 50.7 45.1 42.5  PLT 334 299 312   Recent Labs  Lab 05/03/21 0830 05/05/21 1257 05/07/21 1929 05/08/21 0256 05/09/21 0105  NA 134* 133* 129* 132* 130*  K 4.1 3.6 3.6 4.2 3.3*  CL 99 100 96* 99 98  CO2 '23 24 23 23 25  '$ BUN 25* '16 9 9 9  '$ CREATININE 1.51* 0.80 0.94 0.80 0.88  CALCIUM 9.5 9.0 8.5* 8.4* 8.3*  PROT 8.1 6.9 7.1  --   --   BILITOT 1.4* 1.3* 1.4*  --   --   ALKPHOS 130* 92 99  --   --   ALT '20 20 24  '$ --   --   AST '29 18 24  '$ --   --   GLUCOSE 259* 164* 157* 167* 136*    Imaging/Diagnostic Tests: No results found.   Sharion Settler, DO 05/09/2021, 7:35 AM PGY-2, [place] Intern pager: [phone], text pages welcome

## 2021-05-10 ENCOUNTER — Ambulatory Visit: Payer: Self-pay

## 2021-05-10 ENCOUNTER — Telehealth: Payer: Medicare HMO

## 2021-05-10 ENCOUNTER — Ambulatory Visit: Payer: Self-pay | Admitting: Licensed Clinical Social Worker

## 2021-05-10 DIAGNOSIS — R4182 Altered mental status, unspecified: Secondary | ICD-10-CM | POA: Diagnosis not present

## 2021-05-10 DIAGNOSIS — Z7689 Persons encountering health services in other specified circumstances: Secondary | ICD-10-CM

## 2021-05-10 LAB — CBC WITH DIFFERENTIAL/PLATELET
Abs Immature Granulocytes: 0.06 10*3/uL (ref 0.00–0.07)
Basophils Absolute: 0.1 10*3/uL (ref 0.0–0.1)
Basophils Relative: 1 %
Eosinophils Absolute: 0.2 10*3/uL (ref 0.0–0.5)
Eosinophils Relative: 2 %
HCT: 42.5 % (ref 39.0–52.0)
Hemoglobin: 13.8 g/dL (ref 13.0–17.0)
Immature Granulocytes: 1 %
Lymphocytes Relative: 18 %
Lymphs Abs: 1.6 10*3/uL (ref 0.7–4.0)
MCH: 29.5 pg (ref 26.0–34.0)
MCHC: 32.5 g/dL (ref 30.0–36.0)
MCV: 90.8 fL (ref 80.0–100.0)
Monocytes Absolute: 1.1 10*3/uL — ABNORMAL HIGH (ref 0.1–1.0)
Monocytes Relative: 12 %
Neutro Abs: 5.7 10*3/uL (ref 1.7–7.7)
Neutrophils Relative %: 66 %
Platelets: 303 10*3/uL (ref 150–400)
RBC: 4.68 MIL/uL (ref 4.22–5.81)
RDW: 13.5 % (ref 11.5–15.5)
WBC: 8.7 10*3/uL (ref 4.0–10.5)
nRBC: 0 % (ref 0.0–0.2)

## 2021-05-10 LAB — BASIC METABOLIC PANEL
Anion gap: 11 (ref 5–15)
BUN: 10 mg/dL (ref 8–23)
CO2: 22 mmol/L (ref 22–32)
Calcium: 8.6 mg/dL — ABNORMAL LOW (ref 8.9–10.3)
Chloride: 100 mmol/L (ref 98–111)
Creatinine, Ser: 0.81 mg/dL (ref 0.61–1.24)
GFR, Estimated: >60 mL/min (ref >60)
Glucose, Bld: 85 mg/dL (ref 70–99)
Potassium: 4 mmol/L (ref 3.5–5.1)
Sodium: 133 mmol/L — ABNORMAL LOW (ref 135–145)

## 2021-05-10 LAB — GLUCOSE, CAPILLARY
Glucose-Capillary: 108 mg/dL — ABNORMAL HIGH (ref 70–99)
Glucose-Capillary: 89 mg/dL (ref 70–99)
Glucose-Capillary: 133 mg/dL — ABNORMAL HIGH (ref 70–99)
Glucose-Capillary: 126 mg/dL — ABNORMAL HIGH (ref 70–99)
Glucose-Capillary: 118 mg/dL — ABNORMAL HIGH (ref 70–99)
Glucose-Capillary: 115 mg/dL — ABNORMAL HIGH (ref 70–99)

## 2021-05-10 LAB — PSA: Prostatic Specific Antigen: 45.99 ng/mL — ABNORMAL HIGH (ref 0.00–4.00)

## 2021-05-10 MED ORDER — TAMSULOSIN HCL 0.4 MG PO CAPS
0.4000 mg | ORAL_CAPSULE | Freq: Every day | ORAL | Status: DC
  Administered 2021-05-10 – 2021-06-06 (×28): 0.4 mg via ORAL
  Filled 2021-05-10 (×28): qty 1

## 2021-05-10 NOTE — Progress Notes (Signed)
Family Medicine Teaching Service Daily Progress Note Intern Pager: [phone]  Patient name: Charles Fox Medical record number: [id number] Date of birth: October 11, 1950 Age: 70 y.o. Gender: male  Primary Care Provider: [name], MD Consultants: None Code Status: Full  [name]:  8/19: admitted for AMS, hematuria 8/20: improved mentation, continued hematuria, normal brain MRI 8/21: MOCA 12/30, determined not to have capacity  Assessment and Plan: Charles Fox is a 70 y/o M admitted for AMS, no markable head imaging overnight, mental status slightly improved.  Also admitted for hematuria with improper medical management by patient due to AMS.  AMS likely due to worsening dementia/Alzheimer's  Housing insecurity Work-up thus far is unremarkable with normal chest x-ray, noncontrast CT head and brain MRI findings, ammonia, ethanol, vitamin B12, TSH, and folate levels normal.  UDS negative.  Patient's history of dementia noted in charting.  Patient continues to be alerted to person place and time but forgetful of other events and basic medical management.  Psych consulted and noted 27/34 Mini-Mental status exam.  Patient does not have capacity make decisions, will need long-term placement as mental status not likely to improve. -Neurology referral outpatient for dementia evaluation -TOC to assist long-term home placement, appreciate assistance -PT/OT continue to work with patient -If agitated: Low-dose risperidone, Seroquel may be helpful.  Haldol only as combative  Hematuria  urinary retention UA with greater than 50 RBCs, large hemoglobin.  Few bacteria trace leukocytes, nitrate negative.  CT renal study with bilateral hydronephrosis, no stones.  Patient Foley catheter placed on 8/15 for urinary obstruction.  He has had several ED visits since and has been confused, catheter had been placed for urinary retention.  Previously referred to [facility] urology  outpatient but given dementia, unable to schedule appointment.  Remains afebrile, asymptomatic and urine culture without growth. -Monitor, will need assistance with transportation for outpatient neurology in the future -Start Flomax 0.4 mg daily -PSA for evaluation of prostate pathology  Hypokalemia: Acute, resolved K4.0 this morning.  Resolved at this time.  Hypertension: Chronic, stable Blood pressure ranging from 130-168/68-85.  -Continue Coreg 3.125 mg twice daily -Continue losartan 50 mg nightly  HLD: Chronic, stable LDL at 111 -Continue atorvastatin 40 mg daily  T2DM: Chronic, stable Hemoglobin A1c 8.9. -Continue metformin 500 mg twice daily  FEN/GI: Heart healthy/carb modified PPx: SCDs Dispo:SNF in 2-3 days. Barriers include additional work-up, SNF placement.   Subjective:  Patient states he just wants to leave the hospital.  He did not understand why he cannot leave.  Discussed with patient that his memory is concerning for whether or not he will be able to take care of himself long-term and we are doing this to make sure he does not end up back in the hospital in the state that he was.  Patient was amenable to plan and easily redirectable.  Objective: Temp:  [98.2 F (36.8 C)-99.2 F (37.3 C)] 98.8 F (37.1 C) (08/22 1202) Pulse Rate:  [72-81] 72 (08/22 1202) Resp:  [16-18] 16 (08/22 1202) BP: (130-168)/(68-85) 168/85 (08/22 1202) SpO2:  [96 %-100 %] 96 % (08/22 1202) Weight:  [110.2 kg] 110.2 kg (08/22 0451) Physical Exam: General: Awake, alert, no acute distress Cardiovascular: RRR, no murmurs auscultated Respiratory: CTA B, no increased work of breathing Abdomen: Soft, nondistended  Laboratory: Recent Labs  Lab 05/08/21 0256 05/09/21 0105 05/10/21 0339  WBC 12.1* 11.5* 8.7  HGB 14.9 14.0 13.8  HCT 45.1 42.5 42.5  PLT 299 312 303  Recent Labs  Lab 05/05/21 1257 05/07/21 1929 05/08/21 0256 05/09/21 0105 05/10/21 0339  NA 133* 129* 132* 130*  133*  K 3.6 3.6 4.2 3.3* 4.0  CL 100 96* 99 98 100  CO2 '24 23 23 25 22  '$ BUN '16 9 9 9 10  '$ CREATININE 0.80 0.94 0.80 0.88 0.81  CALCIUM 9.0 8.5* 8.4* 8.3* 8.6*  PROT 6.9 7.1  --   --   --   BILITOT 1.3* 1.4*  --   --   --   ALKPHOS 92 99  --   --   --   ALT 20 24  --   --   --   AST 18 24  --   --   --   GLUCOSE 164* 157* 167* 136* 85   CBG (last 3)  Recent Labs    05/10/21 0731 05/10/21 1151 05/10/21 1605  GLUCAP 108* 89 133*   Imaging/Diagnostic Tests: No new imaging/diagnostic tests at this time.  [name], DO 05/10/2021, 5:04 PM PGY-1, [place] Intern pager: [phone], text pages welcome

## 2021-05-10 NOTE — Hospital Course (Addendum)
Charles Fox is a 70 year old man with a history of T2DM, HTN, HLD, CVA, housing insecurity, tobacco use disorder who was admitted for altered mental status (AMS) and hematuria.   Plan by Problem: AMS  Worsening dementia/Alzheimer's  hospital delirium  MDD + transient SI Patient presented with altered mental state and uncooperative behavior.  CT head and MRI brain negative.  Hypoglycemia, trauma, intracranial process is ruled out.  Infectious cause and electrolyte abnormalities ruled out as well.  Patient was noted to be diagnosed with dementia previously and continued hospital course revealed patient's memory is compromised.  He is able to ambulate appropriately.  Scored 12/30 on MoCA and deemed not to have capacity.  Patient did have temporary time periods of agitation that required medical management using Seroquel.  Patient also had moments of endorsing suicidal ideation and placed on suicide precautions with 1: 1 sitter.  Each time this was mentioned by patient, psychiatry was consulted and suicidal ideation was denied by patient.  Believe this to be caused by patient's worsened cognitive impairment as he temporarily recalls poor outcomes regarding family in the past that do not involve current moments in life. At time of discharge he remained alert, not oriented, did not have capacity to make medical decisions, but had no SI/HI. Of note, patient had to be reminded frequently to drink water.  Citalopram 20 mg daily Seroquel 75 mg qHS Melatonin 5 mg nightly  Hematuria  urinary retention Patient was seen in the ED on 8/15 for urinary retention and had foley catheter placed and then was discharged with referral.  Patient unable to follow-up due to dementia and lacking the executive function capabilities to follow up. But returned to ED on 8/19 with hematuria.  UA showed amber color urine with large hemoglobin, RBC> 50, few bacteria with WBC 21-50, trace LE, nitrite negative.  UTI and cystitis ruled  out.  CT renal study showed bilateral hydronephrosis without stones. PSA elevated at 46, started on Flomax 0.4 mg daily.  Urology advised continuous urinary catheterization and Flomax with PSA recheck in 1 month.  Did not advise further imaging or labs.  Patient failed first voiding trial and had to have Foley catheter replaced. Catheter to be in place and follow up with urology outpatient for voiding trial. Of note, CT stone study from 8/15 found that "the posterior aspect of the bladder is thickened, suspicious for underlying neoplasm." Patient requires urology follow up to evaluate persistent hematuria and possible bladder malignancy. Patient had a repeat PSA preformed on 9/23 while in the hospital at 4.8. Patient has been without foley. May be due to addition of Flomax and Finasteride. Tamsulosin 0.8 mg daily Finasteride 5 mg daily  Capacity Patient still lacks capacity, he is at cognitive baseline, still lacks the ability to manage his own affairs including healthcare and financial decisions. DSS was appointed for guardianship of the patient and on 11/30 was granted guardianship of his estate which is needed to determine payment for SNF.  10/20: New guardian: [employer] maintenance 11/3: Flu vaxx with guardian consent  12/1: COVID (Pfizer)-vaxx with guardian consent 12/7: TB skin test negative  Chronic & stable conditions:  Urinary retention  BPH: Finasteride 5 mg daily, tamsulosin 0.8 mg daily HTN: SBP 140s-150s, DBP 70s-80s. Losartan 75 mg daily Hx of CVA: Coreg 3.125 mg BID, ASA 81 mg daily T2DM: A1c 6.7.  Metformin 500 mg twice daily HLD: Atorvastatin 40 mg daily Constipation:  MiraLAX daily, Senokot BID  Issues for  Follow Up:  Attempted to give shingles vaccine, however not available in the hospital. Flu vax 11/3, Covid-Pfizer 12/1 Consider outpatient neurology referral for dementia Concern for bladder malignancy: Needs urology follow up for potential malignancy and  hematuria. PSA on 06/11/21 was 4.79. Chronic conditions: HLD/CAD/hx of CVA, T2DM, HTN.  On atorvastatin 40 mg, carvedilol 3.125 mg BID, losartan 75 mg, metformin 500 mg twice daily. Patient will need annual imaging of thoracic ascending aorta (4.2 cm)

## 2021-05-10 NOTE — Chronic Care Management (AMB) (Signed)
  Care Management  Collaboration  Note  05/10/2021 Name: Charles Fox MRN: [id number] DOB: 11-20-1950  Charles Fox is a 70 y.o. year old male who is a primary care patient of [name], MD. The CCM team was consulted reference care coordination needs for Level of Care Concerns.  Assessment: Patient was not interviewed or contacted during this encounter.    Last contact with patient was first of 2021.APS report was filed during this time by [place] worker, however shortly afterwards APS case was open, it was closed.  CCM team attempted to reconnect with patient after recent hospital visits this month. CCM LCSW and CCM RN phone appointments were scheduled with patient today.  However patient was admitted to the hospital.  Intervention:Conducted brief assessment, recommendations and relevant information discussed during this encounter. CCM LCSW collaborated with CCM RN and inpatient social worker [place] .    Follow up Plan: No follow up scheduled with CCM team at this time.   Review of patient past medical history, allergies, medications, and health status, including review of pertinent consultant reports was performed as part of comprehensive evaluation and provision of care management/care coordination services.    [name], [place] / [place]   [phone] 2:19 PM

## 2021-05-10 NOTE — Progress Notes (Addendum)
FPTS Brief Progress Note  S: Patient is doing well with no complaints.  Patient is pleasant when conversing tonight. No SI or HI.    O: BP 140/72 (BP Location: Right Arm)   Pulse 83   Temp 98.4 F (36.9 C) (Oral)   Resp 17   Ht '5\' 8"'$  (1.727 m)   Wt 110.2 kg   SpO2 97%   BMI 36.94 kg/m   General: Resting comfortably in bed  A/P: -Discontinued the sitter and suicide precautions.  -Await long-term placement -CBG q2hr-ordered by day team  -Plan per day team  Erskine Emery, MD 05/10/2021, 9:31 PM PGY-1, [facility] Family Medicine Night Resident  Please page [phone] with questions.

## 2021-05-10 NOTE — Patient Instructions (Signed)
     Patient was not contacted during this encounter.  LCSW collaborated with care team   [name], [place] Management & Coordination  [phone]

## 2021-05-10 NOTE — TOC Initial Note (Addendum)
Transition of Care [facility]) - Initial/Assessment Note    Patient Details  Name: Charles Fox MRN: [id number] Date of Birth: July 04, 1951  Transition of [facility]) CM/SW Contact:    Bethann Berkshire, [place] Phone Number: 05/10/2021, 2:19 PM  Clinical Narrative:  CSW began reviewing pt for [facility] consult regarding homelessnes; CSW is also informed that pt lacks capacity. CSW contacted [name], social worker with Family Medicine as she has many notes regarding pt's care and social needs in the past year. She provides background information that pt used to live in a trailer with his wife until she died. At that time he struggled to make rent. His son moved in with him temporarily but once pt son moved out, pt continued to have difficulties with maintaining housing. It is unclear where or how long pt has been without shelter or where he has been staying. [name] explains that her and Family Medicine RNCM attempted to assist pt with social needs and medical needs but pt frequently did not follow up and was frequently inconsistent with what he reported his needs were. He was unable to manage his own medications. She reports pt was confused at times and seemed to fluctuate in his cognitive status. An APS report was made at some point with concerns about pt's capacity to make decisions. Neoma Laming contacted APS in February 2022 and discovered there was no longer a case open.           Neoma Laming informed CSW that they had previously made contact with pt's son, [name], with phone number of [phone]. In a note from 05/06/21 from [facility], pt told her that his son had previously offered to let pt stay with him but pt stated he declined because he didn't want to be around all of his son's kids.   1545: CSW met with pt to discus disposition. He reports he has been living in his truck for 2-3 months. He reports he was living with his father up until father died 6-7 months ago. He states he then lived with  his son, son's girlfriend, and their 4 kids off of [address]. States he got tired of it and left 2-3 months ago and has since been living in his truck. He reports he gets $1500/month in SSI and $900/month in a pension. It is unclear what his efforts have been to obtain housing. Pt presents more and more with inconsistencies and confusion. When CSW ask's about any recent efforts in obtaining housing, pt explains that he can't work and that is why he doesn't have housing even though he has income. When CSW asks about any past attempts to get in a shelter pt starts talking about a church but clarifies that it is not a shelter. When CSW clarifies this and asks if he had been to shelters again in the past, pt again starts talking about a church but explains that they weren't provided shelter and is unclear if he was receiving some other type of service there. He states he may be willing to go to  a shelter but would likely go live in his truck. CSW inquires if pt could return to his son's house, he states "I got put off that land." He is vague and inconsistent regarding reasons he can't go back and live with his son.   Expected Discharge Plan: [place] Barriers to Discharge: Financial Resources, Homeless with medical needs, Unsafe home situation   Patient Goals and CMS  Choice        Expected Discharge Plan and Services Expected Discharge Plan: [place] arrangements for the past 2 months: Homeless                                      Prior Living Arrangements/Services Living arrangements for the past 2 months: Homeless                     Activities of Daily Living      Permission Sought/Granted                  Emotional Assessment         Alcohol / Substance Use: Not Applicable Psych Involvement: Yes (comment)  Admission diagnosis:  Altered mental status [R41.82] Pain [R52] Altered mental status,  unspecified altered mental status type [R41.82] Hematuria, unspecified type [R31.9] AMS (altered mental status) [R41.82] Patient Active Problem List   Diagnosis Date Noted   AMS (altered mental status) 05/09/2021   Altered mental status 05/07/2021   Chest pain 02/26/2021   Acute right-sided thoracic back pain 11/27/2020   History of lacunar cerebrovascular accident 06/11/2020   Dementia without behavioral disturbance ([place]) 05/27/2020   Impaired vision 05/27/2020   Pain, dental 11/20/2019   High priority for COVID-19 virus vaccination 11/20/2019   Chronic pain of both shoulders 09/10/2019   Tendinopathy of left rotator cuff 04/29/2019   Food insecurity 07/09/2018   Skin lesions, generalized 05/09/2018   Hyperlipidemia associated with type 2 diabetes mellitus ([place]) 12/22/2016   Urine test positive for microalbuminuria 05/27/2016   Type 2 diabetes mellitus with neurological complications ([place]) 56/38/7564   Morbid obesity ([place]) 09/04/2014   Hypertension associated with diabetes ([place]) 09/04/2014   Tobacco use disorder 09/04/2014   PCP:  [name], MD Pharmacy:   [facility] [address] [place] [address] Phone: [phone] Fax: [phone]     Social Determinants of Health (SDOH) Interventions    Readmission Risk Interventions No flowsheet data found.

## 2021-05-10 NOTE — Chronic Care Management (AMB) (Signed)
Care Management  Collaboration  Note  05/10/2021 Name: Charles Fox MRN: [id number] DOB: 04/12/1951  Charles Fox is a 70 y.o. year old male who is a primary care patient of [name], MD. The CCM team was consulted regarding care coordination needs.   Assessment: The patient was not interviewed or contacted during this encounter. The CCM team had not spoken with the patient since 2021.  CCM team attempted to reconnect with the patient after recent hospital visits this month by CCM RNCM. CCM RN and CCM LCSW phone appointments were scheduled with the patient today.  Unfortunately, the patient has been admitted to the hospital.   Intervention: Conducted brief assessment, recommendations, and relevant information discussed during this encounter. CCM RNCM collaborated with CCM LCSW.   Follow-up Plan: No follow-up is scheduled with the CCM team at this time.    A review of the patient's past medical history, allergies, medications, and health status, including a review of pertinent consultant reports, was performed as part of a comprehensive evaluation and provision of care management/care coordination services.      [name] RN, BSN, [facility] Care Management Coordinator [place] Phone: [phone] I Fax: [phone]

## 2021-05-10 NOTE — Progress Notes (Signed)
Contacted patient's son, [name] at number [phone] as provided by family medicine center social worker, [name].  Patient's son was able to confirm patient's identity by name and birthdate.  Patient's son states that he had concerns that his father was showing signs of decline in his memory for a while.  He reports that he last spoke to his father 2-3 weeks ago.  Inquired as to what were the patient's living arrangements.  Patient's son states that the patient previously lived with him and other family members in a family home.  Patient's son states that the home that he currently resides in is not his home but is owned by his mother's family members.  He reports that when the patient was living there, there were several family members who preferred that the patient did not live there any longer.  The patient has a cousin who is local however patient's son does not have this cousin's phone number.  He also adds that his father had some conflict with this cousin while they were living together for 4-5 months previously.  He denies any other family members who are in the [place] area.  Patient's son states that he has no knowledge of his father having any type of bladder or prostate medical issues. He had no knowledge of his father needing a catheter for urinary retention.  He states that when his father was last living with him, he had an episode of hypoglycemia for which EMS was called and treated the patient with peanut butter and his hypoglycemia resolved.  [name] is able to confirm that he does have a sister named Rosann Auerbach who lives in [place].   [name] states that he is open to being an emergency contact for the patient if there are any further questions but at this time is unable to offer a safe place for the patient to be discharged to due to previous conflict with  other family members.  Eulis Foster, MD  [facility] Service, PGY-2  [place] Intern  Pager [phone]

## 2021-05-10 NOTE — Evaluation (Signed)
Occupational Therapy Evaluation Patient Details Name: Charles Fox MRN: [id number] DOB: Feb 03, 1951 Today's Date: 05/10/2021    History of Present Illness 70 yr old male who presents with AMS and hematuria/urinary retention. Seen in ED 8/15 for urinary retention and Foley placed. Returned to ED 8/17 with Foley catheter in place with no bag, culture showed rare bacterira pt discharged and returned a few hours later stating he hurt all over. Seen in family medicine clinic. with Admitted for treatment  05/07/21. PMH is significant for PMH is significant for diabetes, CVA  hypertension, hyperlipidemia, homelessness, tobacco use disorder.   Clinical Impression   Pt. Has decreased I and safety with ADLs and ADL tranasfers. Pt.cognition is decreased and was not oriented to time even with multiple reminders. Pt. Was cooperative and was able to follow directions. Pt. Was ed on sitting EOB and using cross leg technique for LE ADLs. Pt. To be followed by acute ot.     Follow Up Recommendations   (Pt. is homeless)    Equipment Recommendations   (Pt. is homeless. unsure what the dc plan is.)    Recommendations for Other Services       Precautions / Restrictions Precautions Precautions: Fall Precaution Comments: Foley in place Restrictions Weight Bearing Restrictions: No      Mobility Bed Mobility Overal bed mobility: Needs Assistance Bed Mobility: Supine to Sit;Sit to Supine     Supine to sit: Min assist Sit to supine: Min assist        Transfers Overall transfer level: Needs assistance Equipment used: None Transfers: Sit to/from Stand Sit to Stand: Min assist              Balance     Sitting balance-Leahy Scale: Good       Standing balance-Leahy Scale: Fair                             ADL either performed or assessed with clinical judgement   ADL Overall ADL's : Needs assistance/impaired Eating/Feeding: Modified independent;Sitting   Grooming:  Wash/dry hands;Wash/dry face;Sitting;Set up;Supervision/safety   Upper Body Bathing: Set up;Sitting;Supervision/ safety   Lower Body Bathing: Moderate assistance;Sit to/from stand   Upper Body Dressing : Minimal assistance;Sitting   Lower Body Dressing: Moderate assistance;Sit to/from stand   Toilet Transfer: Minimal assistance;Stand-pivot;BSC   Toileting- Clothing Manipulation and Hygiene: Minimal assistance;Sit to/from stand       Functional mobility during ADLs: Minimal assistance;Rolling walker General ADL Comments: Pt. did not have lob when sitting eob for adls.     Vision Baseline Vision/History: 1 Wears glasses Ability to See in Adequate Light: 0 Adequate Patient Visual Report: No change from baseline Vision Assessment?: No apparent visual deficits     Perception     Praxis      Pertinent Vitals/Pain Pain Assessment: No/denies pain     Hand Dominance Right   Extremity/Trunk Assessment Upper Extremity Assessment Upper Extremity Assessment: Generalized weakness   Lower Extremity Assessment Lower Extremity Assessment: Defer to PT evaluation       Communication Communication Communication: No difficulties   Cognition Arousal/Alertness: Awake/alert Behavior During Therapy: Flat affect Overall Cognitive Status: Impaired/Different from baseline Area of Impairment: Safety/judgement;Problem solving;Orientation                 Orientation Level: Time;Situation       Safety/Judgement: Decreased awareness of deficits;Decreased awareness of safety   Problem Solving: Slow processing;Requires verbal cues;Requires tactile cues  General Comments       Exercises     Shoulder Instructions      Home Living Family/patient expects to be discharged to:: Other (Comment)                                 Additional Comments: homeless, staying in truck      Prior Functioning/Environment Level of Independence: Independent                  OT Problem List: Decreased activity tolerance;Impaired balance (sitting and/or standing);Decreased knowledge of use of DME or AE      OT Treatment/Interventions: Self-care/ADL training;DME and/or AE instruction;Therapeutic activities;Patient/family education    OT Goals(Current goals can be found in the care plan section) Acute Rehab OT Goals Patient Stated Goal: be better OT Goal Formulation: With patient Time For Goal Achievement: 05/24/21 Potential to Achieve Goals: Fair ADL Goals Pt Will Perform Grooming: with modified independence;standing Pt Will Perform Upper Body Bathing: with modified independence;sitting Pt Will Perform Lower Body Bathing: with modified independence;sit to/from stand Pt Will Perform Upper Body Dressing: with modified independence;sitting Pt Will Perform Lower Body Dressing: with modified independence;sit to/from stand Pt Will Transfer to Toilet: with modified independence;ambulating Pt Will Perform Toileting - Clothing Manipulation and hygiene: with modified independence;sit to/from stand  OT Frequency: Min 2X/week   Barriers to D/C: Decreased caregiver support          Co-evaluation              AM-PAC OT "6 Clicks" Daily Activity     Outcome Measure Help from another person eating meals?: None Help from another person taking care of personal grooming?: A Little Help from another person toileting, which includes using toliet, bedpan, or urinal?: A Little Help from another person bathing (including washing, rinsing, drying)?: A Lot Help from another person to put on and taking off regular upper body clothing?: A Little Help from another person to put on and taking off regular lower body clothing?: A Lot 6 Click Score: 17   End of Session Equipment Utilized During Treatment: Gait belt;Rolling walker  Activity Tolerance: Patient tolerated treatment well Patient left: in bed;with call bell/phone within reach;with bed alarm set;with  nursing/sitter in room  OT Visit Diagnosis: Unsteadiness on feet (R26.81)                Time: [id number] OT Time Calculation (min): 31 min Charges:  OT General Charges $OT Visit: 1 Visit OT Evaluation $OT Eval Moderate Complexity: 1 Mod  Chrysten Woulfe OT/L   Caroll Cunnington 05/10/2021, 2:49 PM

## 2021-05-10 NOTE — Progress Notes (Addendum)
Family Medicine Teaching Service Daily Progress Note Intern Pager: [phone]  Patient name: Charles Fox Medical record number: [id number] Date of birth: 06-26-1951 Age: 70 y.o. Gender: male  Primary Care Provider: [name], MD Consultants: None Code Status: Full  [name]:  8/19: admitted for AMS, hematuria 8/20: improved mentation, continued hematuria, normal brain MRI 8/21: [place] 12/30, determined not to have capacity 8/22: discussion with son revealed pt does not have safe place for discharge regarding family  Assessment and Plan: Charles Fox is a 70 y/o M admitted for AMS, unremarkable head imaging, mental status slightly improved.  Also admitted for hematuria with improper medical management by patient due to AMS.  AMS 2/2 worsening dementia/Alzheimer's  housing insecurity Work-up for causes of AMS unremarkable with normal CXR, noncontrast CT head and brain MRI, ammonia, ethanol, vitamin B12, TSH, folate levels.  UDS negative.  Patient has history of dementia noted in charting.  Patient continues to be alerted to person place and time but forgetful of other events and improper chronology of events in life in addition to lack of medical literacy of self.  Psych consulted and noted 68/30 MMSE.  Patient found to have 12/30 MoCA and determined to not have capacity to make decisions therefore requiring long-term placement as mental status not likely to improve.  Discussion with son revealed patient has conflicts with specific family members which has resulted in inability her son to offer safe place for patient to be discharged. -Neurology referral outpatient for dementia evaluation/treatment -TOC to assist long-term home placement, appreciate assistance -PT/OT continue to work with patient -If agitated: Low-dose risperidone, Seroquel may be helpful.  Haldol only if combative  Hematuria  urinary retention UA on admission showed >50 RBCs, large  hemoglobin.  Few bacteria, trace leukocytes, nitrite negative.  CT renal study with bilateral hydronephrosis, no stones.  Patient Foley catheter placed on 8/15 for urinary obstruction.  He has had several ED visits since has been confused about reason for catheter placement.  Noted to be urinary retention and referred to [facility] urology outpatient but unable to schedule due to dementia.  Hematuria is resolved at this time.  PSA drastically elevated at 46. -Monitor, need assistance with change described patient urology in the future -Continue Flomax 0.4 mg daily -Discussed with urology for recommendations  Hypertension: Chronic, stable Blood pressures ranging from 140s-160s/60s-80s -Continue Coreg 3.125 mg twice daily -Continue losartan 50 mg nightly  HLD: Chronic, stable LDL at 111 -Continue atorvastatin 40 mg daily  T2DM: Chronic, stable Hemoglobin A1c 8.9 -continue metformin 500 mg twice daily  FEN/GI: Heart healthy/carb modified PPx: SCDs Dispo:SNF in 2-3 days. Barriers include additional work-up.   Subjective:  Patient states he wants to leave hospital.  Says he recalls conversation yesterday but could not further state what conversation was about.  Patient is amenable to plan regarding elevated PSA.  Objective: Temp:  [98.4 F (36.9 C)-98.8 F (37.1 C)] 98.7 F (37.1 C) (08/23 1133) Pulse Rate:  [73-83] 73 (08/23 1133) Resp:  [17-19] 19 (08/23 1133) BP: (140-168)/(68-87) 168/87 (08/23 1133) SpO2:  [93 %-98 %] 98 % (08/23 1133) Physical Exam: General: Awake, alert, no acute distress, lacking capacity Cardiovascular: RRR, no murmurs auscultated Respiratory: CTA B, no increased work of breathing Abdomen: Soft, nondistended  Laboratory: Recent Labs  Lab 05/08/21 0256 05/09/21 0105 05/10/21 0339  WBC 12.1* 11.5* 8.7  HGB 14.9 14.0 13.8  HCT 45.1 42.5 42.5  PLT 299 312 303   Recent Labs  Lab 05/05/21 1257 05/07/21 1929 05/08/21 0256 05/09/21 0105 05/10/21 0339   NA 133* 129* 132* 130* 133*  K 3.6 3.6 4.2 3.3* 4.0  CL 100 96* 99 98 100  CO2 '24 23 23 25 22  '$ BUN '16 9 9 9 10  '$ CREATININE 0.80 0.94 0.80 0.88 0.81  CALCIUM 9.0 8.5* 8.4* 8.3* 8.6*  PROT 6.9 7.1  --   --   --   BILITOT 1.3* 1.4*  --   --   --   ALKPHOS 92 99  --   --   --   ALT 20 24  --   --   --   AST 18 24  --   --   --   GLUCOSE 164* 157* 167* 136* 85   PSA: 45.99  Imaging/Diagnostic Tests: No other imaging/diagnostic tests at this time.  [name], DO 05/11/2021, 1:49 PM PGY-1, [place] Intern pager: [phone], text pages welcome

## 2021-05-11 DIAGNOSIS — C679 Malignant neoplasm of bladder, unspecified: Secondary | ICD-10-CM | POA: Diagnosis present

## 2021-05-11 DIAGNOSIS — Z720 Tobacco use: Secondary | ICD-10-CM | POA: Diagnosis not present

## 2021-05-11 DIAGNOSIS — E1169 Type 2 diabetes mellitus with other specified complication: Secondary | ICD-10-CM | POA: Diagnosis present

## 2021-05-11 DIAGNOSIS — W01198A Fall on same level from slipping, tripping and stumbling with subsequent striking against other object, initial encounter: Secondary | ICD-10-CM | POA: Diagnosis not present

## 2021-05-11 DIAGNOSIS — F02C2 Dementia in other diseases classified elsewhere, severe, with psychotic disturbance: Secondary | ICD-10-CM | POA: Diagnosis not present

## 2021-05-11 DIAGNOSIS — F02811 Dementia in other diseases classified elsewhere, unspecified severity, with agitation: Secondary | ICD-10-CM | POA: Diagnosis present

## 2021-05-11 DIAGNOSIS — I959 Hypotension, unspecified: Secondary | ICD-10-CM | POA: Diagnosis not present

## 2021-05-11 DIAGNOSIS — F323 Major depressive disorder, single episode, severe with psychotic features: Secondary | ICD-10-CM | POA: Diagnosis not present

## 2021-05-11 DIAGNOSIS — F05 Delirium due to known physiological condition: Secondary | ICD-10-CM | POA: Diagnosis not present

## 2021-05-11 DIAGNOSIS — I7121 Aneurysm of the ascending aorta, without rupture: Secondary | ICD-10-CM | POA: Diagnosis present

## 2021-05-11 DIAGNOSIS — R079 Chest pain, unspecified: Secondary | ICD-10-CM | POA: Diagnosis not present

## 2021-05-11 DIAGNOSIS — G9340 Encephalopathy, unspecified: Secondary | ICD-10-CM | POA: Diagnosis not present

## 2021-05-11 DIAGNOSIS — G308 Other Alzheimer's disease: Secondary | ICD-10-CM | POA: Diagnosis not present

## 2021-05-11 DIAGNOSIS — N179 Acute kidney failure, unspecified: Secondary | ICD-10-CM | POA: Diagnosis not present

## 2021-05-11 DIAGNOSIS — Z87448 Personal history of other diseases of urinary system: Secondary | ICD-10-CM | POA: Diagnosis not present

## 2021-05-11 DIAGNOSIS — N399 Disorder of urinary system, unspecified: Secondary | ICD-10-CM | POA: Diagnosis not present

## 2021-05-11 DIAGNOSIS — I951 Orthostatic hypotension: Secondary | ICD-10-CM | POA: Diagnosis not present

## 2021-05-11 DIAGNOSIS — F0390 Unspecified dementia without behavioral disturbance: Secondary | ICD-10-CM | POA: Diagnosis not present

## 2021-05-11 DIAGNOSIS — E785 Hyperlipidemia, unspecified: Secondary | ICD-10-CM | POA: Diagnosis not present

## 2021-05-11 DIAGNOSIS — Y846 Urinary catheterization as the cause of abnormal reaction of the patient, or of later complication, without mention of misadventure at the time of the procedure: Secondary | ICD-10-CM | POA: Diagnosis not present

## 2021-05-11 DIAGNOSIS — Z658 Other specified problems related to psychosocial circumstances: Secondary | ICD-10-CM | POA: Diagnosis not present

## 2021-05-11 DIAGNOSIS — S0990XA Unspecified injury of head, initial encounter: Secondary | ICD-10-CM | POA: Diagnosis not present

## 2021-05-11 DIAGNOSIS — F0391 Unspecified dementia with behavioral disturbance: Secondary | ICD-10-CM | POA: Diagnosis not present

## 2021-05-11 DIAGNOSIS — R45851 Suicidal ideations: Secondary | ICD-10-CM | POA: Diagnosis not present

## 2021-05-11 DIAGNOSIS — Y92231 Patient bathroom in hospital as the place of occurrence of the external cause: Secondary | ICD-10-CM | POA: Diagnosis not present

## 2021-05-11 DIAGNOSIS — Z23 Encounter for immunization: Secondary | ICD-10-CM | POA: Diagnosis present

## 2021-05-11 DIAGNOSIS — R339 Retention of urine, unspecified: Secondary | ICD-10-CM | POA: Diagnosis not present

## 2021-05-11 DIAGNOSIS — Y92239 Unspecified place in hospital as the place of occurrence of the external cause: Secondary | ICD-10-CM | POA: Diagnosis not present

## 2021-05-11 DIAGNOSIS — R4182 Altered mental status, unspecified: Secondary | ICD-10-CM | POA: Diagnosis present

## 2021-05-11 DIAGNOSIS — Z8673 Personal history of transient ischemic attack (TIA), and cerebral infarction without residual deficits: Secondary | ICD-10-CM | POA: Diagnosis not present

## 2021-05-11 DIAGNOSIS — R410 Disorientation, unspecified: Secondary | ICD-10-CM | POA: Diagnosis not present

## 2021-05-11 DIAGNOSIS — E871 Hypo-osmolality and hyponatremia: Secondary | ICD-10-CM | POA: Diagnosis not present

## 2021-05-11 DIAGNOSIS — R319 Hematuria, unspecified: Secondary | ICD-10-CM | POA: Diagnosis not present

## 2021-05-11 DIAGNOSIS — F322 Major depressive disorder, single episode, severe without psychotic features: Secondary | ICD-10-CM | POA: Diagnosis not present

## 2021-05-11 DIAGNOSIS — G309 Alzheimer's disease, unspecified: Secondary | ICD-10-CM | POA: Diagnosis not present

## 2021-05-11 DIAGNOSIS — Z59811 Housing instability, housed, with risk of homelessness: Secondary | ICD-10-CM | POA: Diagnosis not present

## 2021-05-11 DIAGNOSIS — T8386XA Thrombosis of genitourinary prosthetic devices, implants and grafts, initial encounter: Secondary | ICD-10-CM | POA: Diagnosis not present

## 2021-05-11 DIAGNOSIS — F02C18 Dementia in other diseases classified elsewhere, severe, with other behavioral disturbance: Secondary | ICD-10-CM | POA: Diagnosis not present

## 2021-05-11 DIAGNOSIS — I10 Essential (primary) hypertension: Secondary | ICD-10-CM | POA: Diagnosis not present

## 2021-05-11 DIAGNOSIS — Z20822 Contact with and (suspected) exposure to covid-19: Secondary | ICD-10-CM | POA: Diagnosis not present

## 2021-05-11 DIAGNOSIS — F0281 Dementia in other diseases classified elsewhere with behavioral disturbance: Secondary | ICD-10-CM | POA: Diagnosis not present

## 2021-05-11 DIAGNOSIS — E119 Type 2 diabetes mellitus without complications: Secondary | ICD-10-CM | POA: Diagnosis not present

## 2021-05-11 DIAGNOSIS — Z6836 Body mass index (BMI) 36.0-36.9, adult: Secondary | ICD-10-CM | POA: Diagnosis not present

## 2021-05-11 DIAGNOSIS — R531 Weakness: Secondary | ICD-10-CM | POA: Diagnosis not present

## 2021-05-11 DIAGNOSIS — E1159 Type 2 diabetes mellitus with other circulatory complications: Secondary | ICD-10-CM | POA: Diagnosis not present

## 2021-05-11 DIAGNOSIS — I152 Hypertension secondary to endocrine disorders: Secondary | ICD-10-CM | POA: Diagnosis present

## 2021-05-11 DIAGNOSIS — E1149 Type 2 diabetes mellitus with other diabetic neurological complication: Secondary | ICD-10-CM | POA: Diagnosis present

## 2021-05-11 DIAGNOSIS — E113593 Type 2 diabetes mellitus with proliferative diabetic retinopathy without macular edema, bilateral: Secondary | ICD-10-CM | POA: Diagnosis not present

## 2021-05-11 DIAGNOSIS — J9811 Atelectasis: Secondary | ICD-10-CM | POA: Diagnosis present

## 2021-05-11 DIAGNOSIS — H35033 Hypertensive retinopathy, bilateral: Secondary | ICD-10-CM | POA: Diagnosis not present

## 2021-05-11 DIAGNOSIS — D72829 Elevated white blood cell count, unspecified: Secondary | ICD-10-CM | POA: Diagnosis present

## 2021-05-11 DIAGNOSIS — N320 Bladder-neck obstruction: Secondary | ICD-10-CM | POA: Diagnosis not present

## 2021-05-11 LAB — GLUCOSE, CAPILLARY
Glucose-Capillary: 135 mg/dL — ABNORMAL HIGH (ref 70–99)
Glucose-Capillary: 158 mg/dL — ABNORMAL HIGH (ref 70–99)
Glucose-Capillary: 161 mg/dL — ABNORMAL HIGH (ref 70–99)
Glucose-Capillary: 162 mg/dL — ABNORMAL HIGH (ref 70–99)
Glucose-Capillary: 171 mg/dL — ABNORMAL HIGH (ref 70–99)
Glucose-Capillary: 176 mg/dL — ABNORMAL HIGH (ref 70–99)

## 2021-05-11 MED ORDER — QUETIAPINE FUMARATE 25 MG PO TABS
25.0000 mg | ORAL_TABLET | Freq: Once | ORAL | Status: AC
  Administered 2021-05-11: 25 mg via ORAL
  Filled 2021-05-11: qty 1

## 2021-05-11 MED ORDER — QUETIAPINE FUMARATE 25 MG PO TABS: 25.0000 mg | ORAL_TABLET | Freq: Once | ORAL | Status: DC

## 2021-05-11 NOTE — Progress Notes (Addendum)
Family Medicine Teaching Service Daily Progress Note Intern Pager: [phone]  Patient name: Charles Fox Medical record number: [id number] Date of birth: 1951-03-12 Age: 70 y.o. Gender: male  Primary Care Provider: [name], MD Consultants: Psychiatry (s/o) Code Status: Full  [name]:  8/19: admitted for AMS, hematuria 8/20: improved mentation, continued hematuria, normal brain MRI 8/21: [place] 12/30, determined not to have capacity 8/22: discussion with son revealed pt does not have safe place for discharge regarding family 8/23: PSA elevated. Urology recommend keeping catheter and rechecking PSA in 1 month  Assessment and Plan: Charles Fox is a 70 y/o M admitted for AMS, unremarkable head imaging, mental status slightly improved.  Also admitted for hematuria with improper medical management by patient due to AMS  AMS 2/2 worsening dementia/Alzheimer's: Chronic, stable  housing insecurity Work-up for causes of AMS unremarkable with normal chest x-ray, noncontrast CT head and brain MRI, ammonia, ethanol, vitamin B12, TSH, folate, UDS.  Patient has history of dementia noted in charting.  Patient continues to be alerted to person, place and time but forgetful of other events and improper chronology of events in life in addition to lack of medical literacy of self.  Psych previously consulted and noted 27/30 MMSE.  Patient did not have 12/30 MoCA and determined to not have capacity to make decisions therefore requiring long-term placement as a mental status not likely to improve.  Discussion with son revealed patient has conflict with specific family members which is resulted in inability of son to offer safe place for patient to be discharged.  As patient's clinical status of this condition is unlikely to improve, medical management has remained the same.  Patient has become agitated at night which improved with Seroquel.  Suspect this is potentially sundowning.   We will continue to monitor this for considering regular treatment. -Neurology referral outpatient for dementia evaluation/treatment -TOC to assist with long-term home placement, appreciate assistance.  Do not recommend homeless shelter at any point. -PT/OT continue to work with patient -If agitated: Low-dose risperidone or Seroquel may be helpful.  Haldol only if combative. -  Hematuria  urinary retention UA on admission showed >50 RBCs, large hemoglobin, few bacteria, trace leukocyte Estrace, nitrite negative.  CT renal study with bilateral hydronephrosis, no stones.  Patient had Foley catheter placed on 8/15 ED visit for urinary obstruction.  He has had several ED visits since and has been confused about reason for catheter placement.  Noted to be urinary retention and referred to [facility] urology outpatient but I am unable to schedule due to dementia.  Hematuria is resolved at this time.  PSA elevated at 46.  Discussed with urology yesterday who advised leaving catheter in place for urinary retention, continuing treatment, and rechecking PSA in 1 month (06/10/21). Will attempt voiding trial as long term catheter use is infection risk. -Continue Flomax 0.4 mg daily -Recheck PSA in (06/10/2021) -Voiding trial  HTN: Chronic, stable Blood pressures ranging from 140s/160s/70s-80s. Continue Coreg 3.125 mg twice daily -Continue losartan 50 mg nightly  HLD: Chronic, stable LDL at 111 -Continue atorvastatin 40 mg daily  T2DM: Chronic, stable Hemoglobin A1c 8.9. -Continue metformin 500 mg twice daily  FEN/GI: Heart healthy/carb modified PPx: SCDs Dispo:Home with home health  today. Barriers include placement, medically stable at this time.   Subjective:  Patient states he is doing fine today and wants to leave.  Says he does not recall myself or previous conversation about his prostate.  Objective: Temp:  [  97.4 F (36.3 C)-98.4 F (36.9 C)] 98.3 F (36.8 C) (08/24 1610) Pulse Rate:   [82-108] 108 (08/24 1610) Resp:  [18] 18 (08/24 1610) BP: (127-153)/(71-87) 127/78 (08/24 1610) SpO2:  [95 %-100 %] 100 % (08/24 1610) Physical Exam: General: Awake, alert, no acute distress, lacking capacity Cardiovascular: RRR, no murmurs auscultated Respiratory: CTA B, no increased work of breathing Abdomen: Soft, nondistended, normoactive bowel sounds  Laboratory: Recent Labs  Lab 05/08/21 0256 05/09/21 0105 05/10/21 0339  WBC 12.1* 11.5* 8.7  HGB 14.9 14.0 13.8  HCT 45.1 42.5 42.5  PLT 299 312 303   Recent Labs  Lab 05/07/21 1929 05/08/21 0256 05/09/21 0105 05/10/21 0339  NA 129* 132* 130* 133*  K 3.6 4.2 3.3* 4.0  CL 96* 99 98 100  CO2 '23 23 25 22  '$ BUN '9 9 9 10  '$ CREATININE 0.94 0.80 0.88 0.81  CALCIUM 8.5* 8.4* 8.3* 8.6*  PROT 7.1  --   --   --   BILITOT 1.4*  --   --   --   ALKPHOS 99  --   --   --   ALT 24  --   --   --   AST 24  --   --   --   GLUCOSE 157* 167* 136* 85   CBG (last 3)  Recent Labs    05/11/21 0748 05/11/21 0942 05/11/21 1136  GLUCAP 171* 176* 158*    Imaging/Diagnostic Tests: No new imaging/diagnostic tests at this time.  Charles Guiles, DO 05/12/2021, 5:02 PM PGY-1, [place] Intern pager: [phone], text pages welcome

## 2021-05-11 NOTE — Progress Notes (Signed)
Discussed patient's hospital course with Dr. [name] (urology).  Dr. [name] advised rechecking PSA in 1 month while continuing Flomax and catheterization for continued urine drainage.  He stated that PSA may be this elevated due to urinary retention.  He stated that treatment and further work-up for prostate cancer is not advised at this time due to patient's dementia/Alzheimer's.

## 2021-05-11 NOTE — TOC Progression Note (Addendum)
Transition of Care [facility]) - Progression Note    Patient Details  Name: ALEKAI POCOCK MRN: [id number] Date of Birth: 08-Mar-1951  Transition of Care [facility]) CM/SW [place], [place] Phone Number: 05/11/2021, 2:41 PM  Clinical Narrative:     CSW met with pt to assess his willingness to go to a family care home or assisted living facility. CSW explains care provided at these facilities and that medical team thinks he needs more support upon discharge. CSW explains that without medicaid, pt would have to pay privately. Pt is initially agreeable but it becomes more apparent that pt believes CSW is talking about hiring him to do transportation for [facility] homes as he used to work for "[employer]" now called "[employer] He explained he is family with family care homes due to his former career. CSW attempted to explain that CSW is talking about pt being a resident at a facility but pt continues to believe he is being offered a job. Pt is unable to comprehend situation.   CSW will follow up with pt son to get consent for ALF or a family care home. Challenge may be in cost and having pt agree to sign his checks over to pay for such facilities. CSW called and left message with pt son requesting return call.   Expected Discharge Plan: [place] Barriers to Discharge: Financial Resources, Homeless with medical needs, Unsafe home situation  Expected Discharge Plan and Services Expected Discharge Plan: [place] arrangements for the past 2 months: Homeless                                       Social Determinants of Health (SDOH) Interventions    Readmission Risk Interventions No flowsheet data found.

## 2021-05-11 NOTE — Progress Notes (Signed)
Physical Therapy Treatment Patient Details Name: Charles Fox MRN: [id number] DOB: 13-Feb-1951 Today's Date: 05/11/2021    History of Present Illness 70 yr old male who presents with AMS and hematuria/urinary retention (foley placed 8/15), diagnosis of UTI. PMH includes diabetes, CVA  hypertension, hyperlipidemia, homelessness, tobacco use disorder.    PT Comments    Pt demonstrating improved activity tolerance today, ambulating hallway distance with intermittent HHA for steadying. Pt complaining of scrotal and back pain throughout session, but overall tolerated mobility well. Pt up in chair, will continue to follow acutely.     Follow Up Recommendations  Home health PT     Equipment Recommendations  Rolling walker with 5" wheels    Recommendations for Other Services       Precautions / Restrictions Precautions Precautions: Fall Precaution Comments: foley cath Restrictions Weight Bearing Restrictions: No    Mobility  Bed Mobility Overal bed mobility: Needs Assistance Bed Mobility: Supine to Sit     Supine to sit: Mod assist     General bed mobility comments: mod assist for trunk elevation off of bed via HHA; x2 attempts as pt reporting the first time his perineal area was too painful to continue.    Transfers Overall transfer level: Needs assistance Equipment used: None Transfers: Sit to/from Stand Sit to Stand: Min guard         General transfer comment: close guard for safety, pt using bedrails to push up to standing.  Ambulation/Gait Ambulation/Gait assistance: Min assist Gait Distance (Feet): 80 Feet Assistive device: None;1 person hand held assist Gait Pattern/deviations: Step-through pattern;Shuffle;Trunk flexed;Decreased stride length Gait velocity: decr   General Gait Details: light assist to steady via HHA, cues for hallway navigation. Pt with short, shuffling steps.   Stairs             Wheelchair Mobility    Modified Rankin  (Stroke Patients Only)       Balance Overall balance assessment: Needs assistance Sitting-balance support: Feet supported;No upper extremity supported Sitting balance-Leahy Scale: Fair     Standing balance support: Single extremity supported;During functional activity Standing balance-Leahy Scale: Fair Standing balance comment: periods of no UE support, pt reaches for environment to self-steady frequently                            Cognition Arousal/Alertness: Awake/alert Behavior During Therapy: Flat affect Overall Cognitive Status: Impaired/Different from baseline Area of Impairment: Safety/judgement;Problem solving                         Safety/Judgement: Decreased awareness of deficits;Decreased awareness of safety   Problem Solving: Slow processing;Requires verbal cues;Requires tactile cues General Comments: pt states "I want to walk out of here and leave" but has very limited acivity tolerance; appears to lack insight into deficits.      Exercises      General Comments        Pertinent Vitals/Pain Pain Assessment: Faces Faces Pain Scale: Hurts little more Pain Location: "my privates" Pain Descriptors / Indicators: Sore;Discomfort Pain Intervention(s): Monitored during session;Repositioned;Limited activity within patient's tolerance    Home Living                      Prior Function            PT Goals (current goals can now be found in the care plan section) Acute Rehab PT Goals Patient Stated Goal:  home PT Goal Formulation: With patient Time For Goal Achievement: 05/23/21 Potential to Achieve Goals: Fair Progress towards PT goals: Progressing toward goals    Frequency    Min 3X/week      PT Plan Current plan remains appropriate    Co-evaluation              AM-PAC PT "6 Clicks" Mobility   Outcome Measure  Help needed turning from your back to your side while in a flat bed without using bedrails?: A  Little Help needed moving from lying on your back to sitting on the side of a flat bed without using bedrails?: A Little Help needed moving to and from a bed to a chair (including a wheelchair)?: A Little Help needed standing up from a chair using your arms (e.g., wheelchair or bedside chair)?: A Little Help needed to walk in hospital room?: A Little Help needed climbing 3-5 steps with a railing? : A Lot 6 Click Score: 17    End of Session   Activity Tolerance: Patient tolerated treatment well Patient left: in chair;with call bell/phone within reach;with chair alarm set Nurse Communication: Mobility status PT Visit Diagnosis: Unsteadiness on feet (R26.81);Other abnormalities of gait and mobility (R26.89);Difficulty in walking, not elsewhere classified (R26.2);Pain     Time: [id number] PT Time Calculation (min) (ACUTE ONLY): 15 min  Charges:  $Gait Training: 8-22 mins                     Stacie Glaze, PT DPT Acute Rehabilitation Services Pager [phone]  Office [phone]    [place] 05/11/2021, 2:01 PM

## 2021-05-11 NOTE — Progress Notes (Signed)
FPTS Brief Progress Note  S Saw patient at bedside this evening with Dr. Zigmund Daniel. Patient was lying in bed comfortably.  He said he wants to leave the hospital and go to his truck and also go to [employer].  He also asked [place] to give him his shoes so he could walk out to the hospital with [place].  We explained that he would not be able to leave as his dementia is worsening and he needs long-term care.  He denied that he has dementia.  He is hungry and requesting food.  O: BP (!) 153/84 (BP Location: Left Arm)   Pulse 82   Temp (!) 97.4 F (36.3 C) (Oral)   Resp 18   Ht '5\' 8"'$  (1.727 m)   Wt 110.2 kg   SpO2 98%   BMI 36.94 kg/m    General: Alert, no acute distress, pleasant Cardio: Well-perfused Pulm: normal work of breathing Neuro: Alert and oriented to name, date of birth, month and location ([place]), does not know he is in the hospital  A/P: Plan per day team  Mild agitation -Seroquel 25 mg one dose -Redirect patient is able -Delirium precautions  -DC cardiac monitoring -Orders reviewed. Labs for AM ordered, which was adjusted as needed.    Lattie Haw, MD 05/11/2021, 8:49 PM PGY-3, [place] [facility] Night Resident  Please page [phone] with questions.

## 2021-05-11 NOTE — Progress Notes (Signed)
Paged to bedside as pt is trying to get out of bed and go to the cigarette store. RN able to redirect pt at times. Saw pt from outside of room. He was awake, moving in bed but not agitated. Will give further seroquel '25mg'$ .   Lattie Haw MD  PGY-3, [place]

## 2021-05-12 ENCOUNTER — Other Ambulatory Visit (HOSPITAL_COMMUNITY): Payer: Self-pay

## 2021-05-12 DIAGNOSIS — R4182 Altered mental status, unspecified: Secondary | ICD-10-CM | POA: Diagnosis not present

## 2021-05-12 NOTE — Progress Notes (Signed)
FPTS Brief Progress Note  S: Patient was sleeping in bed.  I did not wake the patient.    O: BP (!) 150/85 (BP Location: Left Arm)   Pulse 88   Temp 98.1 F (36.7 C) (Oral)   Resp 19   Ht '5\' 8"'$  (1.727 m)   Wt 110.2 kg   SpO2 100%   BMI 36.94 kg/m   General: NAD, sleeping in bed   A/P: Plan per day team  -If patient becomes agitated, we can give another dose of Seroquel. -We will continue to monitor overnight -Orders reviewed. Labs for AM ordered, which was adjusted as needed.    Erskine Emery, MD 05/12/2021, 10:37 PM PGY-1, [place] [facility] Night Resident  Please page [phone] with questions.

## 2021-05-12 NOTE — Progress Notes (Signed)
Physical Therapy Treatment Patient Details Name: Charles Fox MRN: [id number] DOB: Dec 14, 1950 Today's Date: 05/12/2021    History of Present Illness 70 yr old male who presents with AMS and hematuria/urinary retention (foley placed 8/15), diagnosis of UTI. PMH includes diabetes, CVA  hypertension, hyperlipidemia, homelessness, tobacco use disorder.    PT Comments    Pt ambulates increased distances but remains limited by fatigue. Pt will benefit from continued gait training to assess use of DME, with trial of 4 wheeled walker with seat vs RW being of benefit next session. Pt continues to demonstrate significant cognitive deficits, disoriented to place and with little awareness of deficits. Pt will benefit from frequent supervision at the time of discharge to improve safety.   Follow Up Recommendations  Home health PT (pending D/C setting)     Equipment Recommendations  Rolling walker with 5" wheels    Recommendations for Other Services       Precautions / Restrictions Precautions Precautions: Fall Precaution Comments: foley cath Restrictions Weight Bearing Restrictions: No    Mobility  Bed Mobility Overal bed mobility: Needs Assistance Bed Mobility: Supine to Sit     Supine to sit: Supervision          Transfers Overall transfer level: Needs assistance Equipment used: None Transfers: Sit to/from Stand Sit to Stand: Supervision            Ambulation/Gait Ambulation/Gait assistance: Min guard Gait Distance (Feet): 120 Feet Assistive device:  (PRN use of railing) Gait Pattern/deviations: Step-through pattern Gait velocity: reduced Gait velocity interpretation: <1.8 ft/sec, indicate of risk for recurrent falls General Gait Details: pt with slowed step-through gait, PRN use of railing for support, multiple brief standing rest breaks due to fatigue   Stairs             Wheelchair Mobility    Modified Rankin (Stroke Patients Only)        Balance Overall balance assessment: Needs assistance Sitting-balance support: No upper extremity supported;Feet supported Sitting balance-Leahy Scale: Good     Standing balance support: No upper extremity supported Standing balance-Leahy Scale: Fair                              Cognition Arousal/Alertness: Awake/alert Behavior During Therapy: Impulsive Overall Cognitive Status: Impaired/Different from baseline Area of Impairment: Memory;Safety/judgement;Awareness                     Memory: Decreased short-term memory   Safety/Judgement: Decreased awareness of safety;Decreased awareness of deficits Awareness: Intellectual          Exercises      General Comments General comments (skin integrity, edema, etc.): VSS on RA      Pertinent Vitals/Pain Pain Assessment: No/denies pain    Home Living                      Prior Function            PT Goals (current goals can now be found in the care plan section) Acute Rehab PT Goals Patient Stated Goal: home Progress towards PT goals: Progressing toward goals    Frequency    Min 3X/week      PT Plan Current plan remains appropriate    Co-evaluation              AM-PAC PT "6 Clicks" Mobility   Outcome Measure  Help needed turning from your back to  your side while in a flat bed without using bedrails?: A Little Help needed moving from lying on your back to sitting on the side of a flat bed without using bedrails?: A Little Help needed moving to and from a bed to a chair (including a wheelchair)?: A Little Help needed standing up from a chair using your arms (e.g., wheelchair or bedside chair)?: A Little Help needed to walk in hospital room?: A Little Help needed climbing 3-5 steps with a railing? : A Lot 6 Click Score: 17    End of Session   Activity Tolerance: Patient tolerated treatment well Patient left: in chair;with call bell/phone within reach;with chair alarm  set Nurse Communication: Mobility status PT Visit Diagnosis: Unsteadiness on feet (R26.81);Other abnormalities of gait and mobility (R26.89);Difficulty in walking, not elsewhere classified (R26.2);Pain     Time: [id number] PT Time Calculation (min) (ACUTE ONLY): 12 min  Charges:  $Gait Training: 8-22 mins                     Zenaida Niece, PT, DPT Acute Rehabilitation Pager: [phone]    Zenaida Niece 05/12/2021, 12:50 PM

## 2021-05-12 NOTE — Progress Notes (Addendum)
Family Medicine Teaching Service Daily Progress Note Intern Pager: [phone]  Patient name: Charles Fox Medical record number: [id number] Date of birth: 1951/09/17 Age: 70 y.o. Gender: male  Primary Care Provider: [name], MD Consultants: Psychiatry (s/o) Code Status: Full  [name]:  8/19: admitted for AMS, hematuria 8/20: improved mentation, continued hematuria, normal brain MRI 8/21: Charles Fox 12/30, determined not to have capacity 8/22: discussion with son revealed pt does not have safe place for discharge regarding family 8/23: PSA elevated. Urology recommend keeping catheter and rechecking PSA in 1 month 8/24: voiding trial  Assessment and Plan: Charles Fox is a 70 y/o M admitted for AMS, unremarkable head imaging, mental status slightly improved.  Also admitted for hematuria with improper medical management by patient due to AMS.  Past medical history significant for T2DM, HTN, HLD, homelessness, tobacco use disorder, history of CVA.  AMS 2/2 worsening dementia/Alzheimer's: Chronic, stable  housing insecurity Work-up for causes of AMS unremarkable with normal CXR, noncontrast CT head and brain MRI, ammonia, ethanol, B12, TSH, folate, UDS.  History of dementia noted in charting.  Patient continues to be alerted to person, place and time but forgetful of other events and improper chronology of events in life in addition to lack of medical literacy of self.  27/30 MMSE with psychiatry and 12/30 [facility], determined to not have capacity to make decisions therefore requiring long-term placement as mental status not likely to improve.  Discussion with son previously revealed patient has conflicts with family members and has resulted in inability of son to offer safe place for patient to be discharged. -Neurology referral outpatient for dementia evaluation/treatment -TOC to assist with long-term home placement, appreciate assistance.  Do not recommend homeless  shelter or home health at any point -PT continue to work with patient -If agitated: Low-dose risperidone or Seroquel may be helpful.  Haldol only if combative  Hematuria: Resolved  urinary retention UA on admission showed >50 RBCs, large hemoglobin, few bacteria, trace of leukocyte esterase, nitrate negative.  CT renal study with bilateral hydronephrosis, no stones.  Patient had Foley catheter placed on 8/15 ED visit for urinary obstruction.  He has had several ED visits since has been confused about reason for catheter placement.  Noted to have urinary retention and referred to [facility] urology outpatient but unable to follow-up due to dementia.  PSA elevated at 46.  Urology advised rechecking in 1 month while continuing Flomax.  Voiding trial in process.  We will replace catheter with voiding trial fails. -Continue Flomax 0.4 mg daily -Bladder scans every 8 hours -Restart aspirin 81 mg for secondary stroke prevention  Hypertension chronic, stable Blood pressure ranges in 110s-150s/60s-80s.  As patient appears to be more agitated at night, change blood pressure medications to more nighttime dosing. -Change losartan to 50 mg daily beginning morning -Continue Coreg 3.125 mg twice daily  Other chronic conditions stable: HLD, T2DM -Continue medical management  FEN/GI: Heart healthy/carb modified PPx: Lovenox Dispo: Long-term care facility  today. Barriers include placement, medically stable at this time.   Subjective:  Patient states he is doing well wants to leave.  He has been unable to urinate after 2 attempts.  Denies pain with attempting but says that he "just cannot do it".  Objective: Temp:  [97.3 F (36.3 C)-98.3 F (36.8 C)] 98.3 F (36.8 C) (08/25 1237) Pulse Rate:  [88-108] 90 (08/25 1237) Resp:  [16-19] 16 (08/25 1237) BP: (117-150)/(69-88) 117/73 (08/25 1237) SpO2:  [94 %-  100 %] 94 % (08/25 1237) Physical Exam: General: Awake, alert, disrobed lying on bed, no acute  distress Cardiovascular: RRR, no murmurs auscultated Respiratory: CTA B, no increased work of breathing Abdomen: Soft, nontender, nondistended, normoactive bowel sounds  Laboratory: Recent Labs  Lab 05/08/21 0256 05/09/21 0105 05/10/21 0339  WBC 12.1* 11.5* 8.7  HGB 14.9 14.0 13.8  HCT 45.1 42.5 42.5  PLT 299 312 303   Recent Labs  Lab 05/07/21 1929 05/08/21 0256 05/09/21 0105 05/10/21 0339  NA 129* 132* 130* 133*  K 3.6 4.2 3.3* 4.0  CL 96* 99 98 100  CO2 '23 23 25 22  '$ BUN '9 9 9 10  '$ CREATININE 0.94 0.80 0.88 0.81  CALCIUM 8.5* 8.4* 8.3* 8.6*  PROT 7.1  --   --   --   BILITOT 1.4*  --   --   --   ALKPHOS 99  --   --   --   ALT 24  --   --   --   AST 24  --   --   --   GLUCOSE 157* 167* 136* 85    Imaging/Diagnostic Tests: No new imaging/diagnostic tests at this time.  [name], DO 05/13/2021, 1:43 PM PGY-1, [place] Intern pager: [phone], text pages welcome

## 2021-05-12 NOTE — TOC Progression Note (Signed)
Transition of Care [facility]) - Progression Note    Patient Details  Name: Charles Fox MRN: [id number] Date of Birth: 06/14/1951  Transition of Care [facility]) CM/SW [place], LCSW Phone Number: 05/12/2021, 2:30 PM  Clinical Narrative:    CSW left voicemail for patient's son.    Expected Discharge Plan: [place] Barriers to Discharge: Financial Resources, Homeless with medical needs, Unsafe home situation  Expected Discharge Plan and Services Expected Discharge Plan: [place] arrangements for the past 2 months: Homeless                                       Social Determinants of Health (SDOH) Interventions    Readmission Risk Interventions No flowsheet data found.

## 2021-05-13 ENCOUNTER — Other Ambulatory Visit (HOSPITAL_COMMUNITY): Payer: Self-pay

## 2021-05-13 DIAGNOSIS — N320 Bladder-neck obstruction: Secondary | ICD-10-CM | POA: Diagnosis not present

## 2021-05-13 DIAGNOSIS — R4182 Altered mental status, unspecified: Secondary | ICD-10-CM | POA: Diagnosis not present

## 2021-05-13 DIAGNOSIS — F0390 Unspecified dementia without behavioral disturbance: Secondary | ICD-10-CM | POA: Diagnosis not present

## 2021-05-13 DIAGNOSIS — R339 Retention of urine, unspecified: Secondary | ICD-10-CM | POA: Diagnosis not present

## 2021-05-13 DIAGNOSIS — G9340 Encephalopathy, unspecified: Secondary | ICD-10-CM | POA: Diagnosis not present

## 2021-05-13 DIAGNOSIS — R410 Disorientation, unspecified: Secondary | ICD-10-CM | POA: Diagnosis not present

## 2021-05-13 LAB — GLUCOSE, CAPILLARY: Glucose-Capillary: 197 mg/dL — ABNORMAL HIGH (ref 70–99)

## 2021-05-13 MED ORDER — ASPIRIN EC 81 MG PO TBEC
81.0000 mg | DELAYED_RELEASE_TABLET | Freq: Every day | ORAL | Status: DC
  Administered 2021-05-13 – 2021-05-19 (×7): 81 mg via ORAL
  Filled 2021-05-13 (×7): qty 1

## 2021-05-13 MED ORDER — LOSARTAN POTASSIUM 50 MG PO TABS: 50.0000 mg | ORAL_TABLET | Freq: Every day | ORAL | Status: DC

## 2021-05-13 MED ORDER — ENOXAPARIN SODIUM 40 MG/0.4ML IJ SOSY
40.0000 mg | PREFILLED_SYRINGE | INTRAMUSCULAR | Status: DC
  Administered 2021-05-13 – 2021-07-01 (×46): 40 mg via SUBCUTANEOUS
  Filled 2021-05-13 (×47): qty 0.4

## 2021-05-13 MED ORDER — LOSARTAN POTASSIUM 50 MG PO TABS
50.0000 mg | ORAL_TABLET | Freq: Every day | ORAL | Status: DC
  Administered 2021-05-13 – 2021-05-24 (×12): 50 mg via ORAL
  Filled 2021-05-13 (×12): qty 1

## 2021-05-13 NOTE — Progress Notes (Signed)
Occupational Therapy Treatment Patient Details Name: Charles Fox MRN: [id number] DOB: Jan 20, 1951 Today's Date: 05/13/2021    History of present illness 70 yr old male who presents with AMS and hematuria/urinary retention (foley placed 8/15), diagnosis of UTI. PMH includes diabetes, CVA  hypertension, hyperlipidemia, homelessness, tobacco use disorder.   OT comments  Pt. Was seen for OT to maximize I and safety with ADLs and ADL transfers. Pt. Is requiring Min A with LE ADLs and is min guard assist with mobilization. Pt. Has decreased safety and I with preforming self care tasks. Acute OT to follow until dc.   Follow Up Recommendations  SNF;Supervision/Assistance - 24 hour    Equipment Recommendations   (pt. is currently homeless)    Recommendations for Other Services      Precautions / Restrictions Precautions Precautions: Fall Restrictions Weight Bearing Restrictions: No       Mobility Bed Mobility Overal bed mobility: Needs Assistance Bed Mobility: Supine to Sit     Supine to sit: Supervision Sit to supine: Supervision        Transfers     Transfers: Stand Pivot Transfers Sit to Stand: Supervision Stand pivot transfers: Min guard            Balance     Sitting balance-Leahy Scale: Good       Standing balance-Leahy Scale: Fair                             ADL either performed or assessed with clinical judgement   ADL Overall ADL's : Needs assistance/impaired Eating/Feeding: Modified independent;Sitting   Grooming: Wash/dry hands;Wash/dry face;Set up;Supervision/safety;Standing   Upper Body Bathing: Set up;Sitting;Supervision/ safety   Lower Body Bathing: Sit to/from stand;Minimal assistance   Upper Body Dressing : Supervision/safety;Set up;Sitting   Lower Body Dressing: Sit to/from stand;Minimal assistance   Toilet Transfer: Min guard;RW;Ambulation   Toileting- Clothing Manipulation and Hygiene: Sit to/from stand;Min guard        Functional mobility during ADLs: Min guard General ADL Comments: pt. uses cross leg technique for adls.     Vision   Vision Assessment?: No apparent visual deficits   Perception     Praxis      Cognition Arousal/Alertness: Awake/alert Behavior During Therapy: Impulsive Overall Cognitive Status: Impaired/Different from baseline Area of Impairment: Memory;Safety/judgement;Awareness                     Memory: Decreased short-term memory   Safety/Judgement: Decreased awareness of safety;Decreased awareness of deficits   Problem Solving: Slow processing;Requires verbal cues;Requires tactile cues          Exercises     Shoulder Instructions       General Comments      Pertinent Vitals/ Pain       Pain Assessment: No/denies pain  Home Living                                          Prior Functioning/Environment              Frequency  Min 2X/week        Progress Toward Goals  OT Goals(current goals can now be found in the care plan section)  Progress towards OT goals: Progressing toward goals  Acute Rehab OT Goals Patient Stated Goal: get better OT Goal Formulation: With patient Time  For Goal Achievement: 05/24/21 Potential to Achieve Goals: Fair ADL Goals Pt Will Perform Grooming: with modified independence;standing Pt Will Perform Upper Body Bathing: with modified independence;sitting Pt Will Perform Lower Body Bathing: with modified independence;sit to/from stand Pt Will Perform Upper Body Dressing: with modified independence;sitting Pt Will Perform Lower Body Dressing: with modified independence;sit to/from stand Pt Will Transfer to Toilet: with modified independence;ambulating Pt Will Perform Toileting - Clothing Manipulation and hygiene: with modified independence;sit to/from stand  Plan Discharge plan remains appropriate    Co-evaluation                 AM-PAC OT "6 Clicks" Daily Activity      Outcome Measure   Help from another person eating meals?: None Help from another person taking care of personal grooming?: A Little Help from another person toileting, which includes using toliet, bedpan, or urinal?: A Little Help from another person bathing (including washing, rinsing, drying)?: A Little Help from another person to put on and taking off regular upper body clothing?: A Little Help from another person to put on and taking off regular lower body clothing?: A Little 6 Click Score: 19    End of Session Equipment Utilized During Treatment: Gait belt;Rolling walker  OT Visit Diagnosis: Unsteadiness on feet (R26.81)   Activity Tolerance Patient tolerated treatment well   Patient Left in bed;with call bell/phone within reach;with bed alarm set;with nursing/sitter in room   Nurse Communication  (ok therapy)        Time: [id number] OT Time Calculation (min): 39 min  Charges: OT General Charges $OT Visit: 1 Visit OT Treatments $Self Care/Home Management : 38-52 mins  Reece Packer OT/L   Choya Tornow 05/13/2021, 9:21 AM

## 2021-05-13 NOTE — Progress Notes (Signed)
Physical Therapy Treatment Patient Details Name: Charles Fox MRN: [id number] DOB: 19-Jul-1951 Today's Date: 05/13/2021    History of Present Illness 70 yr old male who presents with AMS and hematuria/urinary retention (foley placed 8/15), diagnosis of UTI. PMH includes diabetes, CVA  hypertension, hyperlipidemia, homelessness, tobacco use disorder.    PT Comments    Pt was seen for progression to rollator successfully today.  He is perseverating on the request to go out to smoke and to leave, and talked with him about other topics to distract.  Nursing staff well aware of his request, and are monitoring him for safety.  Follow up with him to mobilize, and recommend a rollator with bariatric frame due to the stability and fit for this patient.  Follow for acute PT goals to improve safety to get home.   Follow Up Recommendations  Home health PT     Equipment Recommendations  Other (comment) (rollator)    Recommendations for Other Services       Precautions / Restrictions Precautions Precautions: Fall Restrictions Weight Bearing Restrictions: No    Mobility  Bed Mobility               General bed mobility comments: up on side of bed    Transfers Overall transfer level: Needs assistance Equipment used: 4-wheeled walker Transfers: Sit to/from Stand Sit to Stand: Supervision            Ambulation/Gait Ambulation/Gait assistance: Min guard Gait Distance (Feet): 325 Feet Assistive device: 4-wheeled walker;1 person hand held assist Gait Pattern/deviations: Step-through pattern;Narrow base of support Gait velocity: reduced   General Gait Details: pt is walking with only rollator support and forgets where he is but can maneuver with vc's   Stairs             Wheelchair Mobility    Modified Rankin (Stroke Patients Only)       Balance     Sitting balance-Leahy Scale: Good       Standing balance-Leahy Scale: Fair                               Cognition Arousal/Alertness: Awake/alert Behavior During Therapy: Impulsive Overall Cognitive Status: Impaired/Different from baseline Area of Impairment: Safety/judgement;Memory;Attention                 Orientation Level: Time;Situation Current Attention Level: Selective Memory: Decreased short-term memory   Safety/Judgement: Decreased awareness of safety;Decreased awareness of deficits Awareness: Intellectual Problem Solving: Requires verbal cues        Exercises      General Comments General comments (skin integrity, edema, etc.): pt is on rollator with no extra help other than min guard for safety due to using a new device      Pertinent Vitals/Pain Pain Assessment: No/denies pain    Home Living                      Prior Function            PT Goals (current goals can now be found in the care plan section) Acute Rehab PT Goals Patient Stated Goal: to get outdoors to smoke Progress towards PT goals: Progressing toward goals    Frequency    Min 3X/week      PT Plan Current plan remains appropriate    Co-evaluation              AM-PAC PT "6  Clicks" Mobility   Outcome Measure  Help needed turning from your back to your side while in a flat bed without using bedrails?: A Little Help needed moving from lying on your back to sitting on the side of a flat bed without using bedrails?: A Little Help needed moving to and from a bed to a chair (including a wheelchair)?: A Little Help needed standing up from a chair using your arms (e.g., wheelchair or bedside chair)?: A Little Help needed to walk in hospital room?: A Little Help needed climbing 3-5 steps with a railing? : A Little 6 Click Score: 18    End of Session Equipment Utilized During Treatment: Gait belt Activity Tolerance: Patient tolerated treatment well Patient left: in bed;with call bell/phone within reach Nurse Communication: Mobility status PT Visit Diagnosis:  Unsteadiness on feet (R26.81);Other abnormalities of gait and mobility (R26.89);Difficulty in walking, not elsewhere classified (R26.2)     Time: [id number] PT Time Calculation (min) (ACUTE ONLY): 37 min  Charges:  $Gait Training: 8-22 mins $Therapeutic Activity: 8-22 mins                  Ramond Dial 05/13/2021, 4:47 PM  Mee Hives, PT MS Acute Rehab Dept. Number: [place]

## 2021-05-13 NOTE — Progress Notes (Signed)
Family Medicine Teaching Service Daily Progress Note Intern Pager: [phone]  Patient name: Charles Fox Medical record number: [id number] Date of birth: 03/26/1951 Age: 70 y.o. Gender: male  Primary Care Provider: [name], MD Consultants: Psychiatry Code Status: Full  [name]:  8/19: admitted for AMS, hematuria 8/20: improved mentation, continued hematuria, normal brain MRI 8/21: [place] 12/30, determined not to have capacity 8/22: discussion with son revealed pt does not have safe place for discharge regarding family 8/23: PSA elevated. Urology recommend keeping catheter and rechecking PSA in 1 month 8/24: voiding trial 8/25: suicide precautions, voiding trial failed, foley reinserted  Assessment and Plan: Charles Fox is a 70 y/o M admitted for AMS, unremarkable head imaging, mental status slightly improved.  Also admitted for hematuria with improper medical management by patient due to AMS.  Past medical history significant for T2DM, HTN, HLD, homelessness, tobacco use disorder, history of CVA.  AMS 2/2 worsening dementia/Alzheimer's: Chronic, stable  housing insecurity Work-up for causes of AMS unremarkable with normal CXR, noncontrast CT head and brain MRI, ammonia, ethanol, B12, TSH, folate, UDS.  History of dementia noted in charting.  Patient continues to be alert to person place and time but forgetful of other events and improper chronology of events in life in addition to lack of medical literacy of self.  27/30 MMSE with psychiatry in 12/30 [facility] with primary team.  Determined to not have capacity to make decisions therefore requiring long-term placement as mental status not likely to improve.  Discussion with son previously revealed patient has complex with family members and has resulted in inability of son to offer safe place for patient to be discharged. -Neurology referral outpatient for dementia evaluation/treatment -TOC to assist with  long-term home placement, appreciate assistance.  Do not recommend homeless shelter or home health at any point.  Voice message is left for son by [facility], so far no response -PT continue to work with patient  Hematuria: resolved  urinary retention UA on admission showed >50 RBCs, large Hgb, few bacteria, trace leukocyte esterase, nitrite negative. CT renal study w/ b/l hydronephrosis, no stones. Patient had Foley catheter placed on 8/15 ED visit for urinary obstruction. He has had several ED visits since and has been confused about reason for catheter placement. Noted to have urinary retention and referred to [facility] outpatient but unable to follow-up due to dementia. PSA elevated at 46. Urology advised rechecking in 1 month while continuing Flomax. Pt failed voiding trial, therefore foley catheter was replaced.  -Continue Flomax 0.'4mg'$  daily -Continue to monitor for hematuria -Urology follow up outpatient  Suicidality Earlier in admission, voiced suicidal intent.  Overnight, voiced suicidal intent again with 19.  Placed on suicide precautions with 1:1 sitter. Admits to suicidal thoughts but no plan. Denies homicidal ideation. -Consult psychiatry  Other chronic conditions stable: HTN, HLD, T2DM, CVA hx -continue medical management   FEN/GI: Heart healthy/carb modified PPx: Lovenox Dispo:SNF today. Barriers include placement.   Subjective:  Pt states that he is mad his uncle because his uncle has had bad things about him in the past.  Patient then stated he has not spoken with his uncle for a long time.  Denies any concerns or questions at this time.  Says that he has had suicidal thoughts but no plan.  Denies homicidal ideation  Objective: Temp:  [97.3 F (36.3 C)-98.3 F (36.8 C)] 97.3 F (36.3 C) (08/25 2037) Pulse Rate:  [87-93] 87 (08/25 2037) Resp:  [  16-19] 18 (08/25 2037) BP: (117-141)/(69-88) 118/70 (08/25 2037) SpO2:  [94 %-97 %] 95 % (08/25 2037) Physical  Exam: General: awake, alert, no acute distress Cardiovascular: RRR, no murmurs auscultated Respiratory: CTA B, no increased work of breathing Abdomen: Soft, nontender, normoactive bowel sounds  Laboratory: Recent Labs  Lab 05/08/21 0256 05/09/21 0105 05/10/21 0339  WBC 12.1* 11.5* 8.7  HGB 14.9 14.0 13.8  HCT 45.1 42.5 42.5  PLT 299 312 303   Recent Labs  Lab 05/07/21 1929 05/08/21 0256 05/09/21 0105 05/10/21 0339  NA 129* 132* 130* 133*  K 3.6 4.2 3.3* 4.0  CL 96* 99 98 100  CO2 '23 23 25 22  '$ BUN '9 9 9 10  '$ CREATININE 0.94 0.80 0.88 0.81  CALCIUM 8.5* 8.4* 8.3* 8.6*  PROT 7.1  --   --   --   BILITOT 1.4*  --   --   --   ALKPHOS 99  --   --   --   ALT 24  --   --   --   AST 24  --   --   --   GLUCOSE 157* 167* 136* 85    Imaging/Diagnostic Tests: No new imaging/diagnostic tests at this time  [name], DO 05/13/2021, 11:43 PM PGY-1, [place] Intern pager: [phone], text pages welcome

## 2021-05-13 NOTE — TOC Progression Note (Signed)
Transition of Care [facility]) - Progression Note    Patient Details  Name: TATIANA LETTER MRN: [id number] Date of Birth: 1951-08-02  Transition of Care [facility]) CM/SW [place], LCSW Phone Number: 05/13/2021, 1:50 PM  Clinical Narrative:    CSW left another voicemail for patient's son, [name], to determine if he can sign patient in to a facility.    Expected Discharge Plan: [place] Barriers to Discharge: Financial Resources, Homeless with medical needs, Unsafe home situation  Expected Discharge Plan and Services Expected Discharge Plan: [place] arrangements for the past 2 months: Homeless                                       Social Determinants of Health (SDOH) Interventions    Readmission Risk Interventions No flowsheet data found.

## 2021-05-13 NOTE — Care Management Important Message (Signed)
Important Message  Patient Details  Name: Charles Fox MRN: [id number] Date of Birth: 03-17-1951   Medicare Important Message Given:  Yes     Orbie Pyo 05/13/2021, 4:00 PM

## 2021-05-13 NOTE — Progress Notes (Signed)
FPTS Brief Progress Note  S: Charles Fox reports that he would like to die. He does not want to be a burden anymore and would like to leave to die. When asked if he had a plan, he told me he didn't want to tell anyone what his plan was. He notes that he has no home, no family, and no friends and thinks it's his time to go. 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 Denies HI.    O: BP 118/70 (BP Location: Right Arm)   Pulse 87   Temp (!) 97.3 F (36.3 C)   Resp 18   Ht '5\' 8"'$  (1.727 m)   Wt 110.2 kg   SpO2 95%   BMI 36.94 kg/m   General: NAD, became tearful while resting in bed  Resp: Normal WOB MSK: Moving all extremities  Psych: Depressed affect   A/P: Plan per day team -Expresses suicidal intent, will continue with 1:1 sitter and suicide precautions -Charles Fox is redirectable, but can add medications if he becomes aggressive  -Foley reinserted after bladder scan showed 700 of urine -Orders reviewed. Labs for AM ordered, which was adjusted as needed.  -Losartan and ASA added back on   Erskine Emery, MD 05/13/2021, 10:21 PM PGY-1, [facility] Family Medicine Night Resident  Please page [phone] with questions.

## 2021-05-14 DIAGNOSIS — N320 Bladder-neck obstruction: Secondary | ICD-10-CM | POA: Diagnosis not present

## 2021-05-14 DIAGNOSIS — F0390 Unspecified dementia without behavioral disturbance: Secondary | ICD-10-CM | POA: Diagnosis not present

## 2021-05-14 DIAGNOSIS — R45851 Suicidal ideations: Secondary | ICD-10-CM | POA: Diagnosis not present

## 2021-05-14 DIAGNOSIS — R410 Disorientation, unspecified: Secondary | ICD-10-CM | POA: Diagnosis not present

## 2021-05-14 DIAGNOSIS — R339 Retention of urine, unspecified: Secondary | ICD-10-CM | POA: Diagnosis not present

## 2021-05-14 DIAGNOSIS — G9340 Encephalopathy, unspecified: Secondary | ICD-10-CM | POA: Diagnosis not present

## 2021-05-14 DIAGNOSIS — R4182 Altered mental status, unspecified: Secondary | ICD-10-CM | POA: Diagnosis not present

## 2021-05-14 LAB — BASIC METABOLIC PANEL
Anion gap: 7 (ref 5–15)
BUN: 15 mg/dL (ref 8–23)
CO2: 24 mmol/L (ref 22–32)
Calcium: 8.8 mg/dL — ABNORMAL LOW (ref 8.9–10.3)
Chloride: 102 mmol/L (ref 98–111)
Creatinine, Ser: 1.1 mg/dL (ref 0.61–1.24)
GFR, Estimated: >60 mL/min (ref >60)
Glucose, Bld: 110 mg/dL — ABNORMAL HIGH (ref 70–99)
Potassium: 4.1 mmol/L (ref 3.5–5.1)
Sodium: 133 mmol/L — ABNORMAL LOW (ref 135–145)

## 2021-05-14 LAB — GLUCOSE, CAPILLARY: Glucose-Capillary: 110 mg/dL — ABNORMAL HIGH (ref 70–99)

## 2021-05-14 NOTE — Progress Notes (Addendum)
Family Medicine Teaching Service Daily Progress Note Intern Pager: [phone]  Patient name: Charles Fox Medical record number: [id number] Date of birth: 06-11-1951 Age: 70 y.o. Gender: male  Primary Care Provider: [name], MD Consultants: Psychiatry (signed off) Code Status: Full  [name]:  8/19: admitted for AMS, hematuria 8/20: improved mentation, continued hematuria, normal brain MRI 8/21: [place] 12/30, determined not to have capacity 8/22: discussion with son revealed pt does not have safe place for discharge regarding family 8/23: PSA elevated. Urology recommend keeping catheter and rechecking PSA in 1 month 8/24: voiding trial 8/25: suicide precautions, voiding trial failed, foley reinserted  Assessment and Plan: 70 year old male admitted for altered mental status with unremarkable head imaging.  Patient also with hematuria and now with Foley catheter in place due to failed voiding trial.  Past medical history significant for T2DM, HTN, HLD, homelessness, tobacco use disorder, history of CVA.  Altered mental status secondary to worsening dementia housing insecurity/homelessness Currently medically stable and awaiting placement.  Patient was determined to not have capacity to make decisions and therefore requires long-term placement for safety. -Neurology referral outpatient for dementia evaluation -Awaiting placement, appreciate TOC assistance -Continue PT  Hypotension: Overnight BP ranged from 105/45-121/52, currently normotensive at 131/75.  Continues on Coreg 3.125 twice daily and Cozaar 50 mg daily. -Consider reducing Cozaar if patient has bouts of hypotension.  BP appears normotensive at this time.  Urinary retention Foley catheter in place.  Patient with no complaints at this time.  Continues on Flomax. -Continue Flomax as above -Urology follow-up in the outpatient setting  Suicidality-improved/resolved No suicidal ideation at this  time.  He has been evaluated by psychiatry due to suicidal intent earlier during the hospitalization. -Continue to monitor  FEN/GI: Heart healthy/carb modified PPx: Lovenox Dispo:SNF  pending placement . Barriers include history of homelessness, recent suicidality which is since improved.  Patient is medically stable for SNF.  Subjective:  No complaints or concerns this morning.  Denies SI/HI.  Objective: Temp:  [97.8 F (36.6 C)-98 F (36.7 C)] 98 F (36.7 C) (08/26 2032) Pulse Rate:  [81-95] 84 (08/26 2032) Resp:  [16-18] 18 (08/26 2032) BP: (105-132)/(45-74) 105/45 (08/26 2032) SpO2:  [93 %-99 %] 98 % (08/26 2032) Physical Exam: General: Elderly man lying in bed Cardiovascular: RRR, no murmurs Respiratory: CTA Abdomen: No abdominal discomfort to palpation Extremities: No pitting edema of the lower extremities  Laboratory: Recent Labs  Lab 05/08/21 0256 05/09/21 0105 05/10/21 0339  WBC 12.1* 11.5* 8.7  HGB 14.9 14.0 13.8  HCT 45.1 42.5 42.5  PLT 299 312 303   Recent Labs  Lab 05/09/21 0105 05/10/21 0339 05/14/21 0206  NA 130* 133* 133*  K 3.3* 4.0 4.1  CL 98 100 102  CO2 '25 22 24  '$ BUN '9 10 15  '$ CREATININE 0.88 0.81 1.10  CALCIUM 8.3* 8.6* 8.8*  GLUCOSE 136* 85 110*     Lurline Del, DO 05/14/2021, 9:16 PM PGY-3, [place] Intern pager: [phone], text pages welcome

## 2021-05-14 NOTE — Consult Note (Addendum)
[facility] Consult   Reason for Consult:  Suicidal ideation Referring Physician:  [name] Patient Identification: DAJOHN ROSIE MRN:  [id number] Principal Diagnosis: Altered mental status Diagnosis:  Principal Problem:   Altered mental status Active Problems:   AMS (altered mental status)   Total Time spent with patient: 45 minutes  Subjective:  "I cannot do that, I think I can go to my father who is dead". "I need help with housing". HPI: LYALL TUMOLO is a 70 y.o. male patient with past medical history of DM, CVA, HTN, HLD, hopelessness, tobacco use admitted with altered mental status, urinary retention, hematuria.  Patient was evaluated by Dr. Darleene Cleaver 8/21 who recommended dementia evaluation and delirium precautions.  Psych reconsulted for new suicidal ideation.  Patient was seen and examined today.  Patient is sitting comfortably in bed.  Patient states he has been feeling better.  Patient endorses suicidal ideation  states" I cannot do that, I think I can go to my father who is dead".  Patient denies any plan but has intention. Patient contracts for safety at this time. Patient states he doesn't want to go back and wants to stay here or anywhere we can send him.  When asked if he needs help with housing, he states yes but states he does not want to go to shelter.  Pt states he doesn't want to go back to his son's house. When asked if the social worker can find a place for him,  Will he be safe?  He states yes, he would be safe and would not hurt himself.  Patient endorses depressed mood since this hospitalization, fatigue, low energy, and feeling hopeless.  Patient states sometimes he feels helpless.  Patient denies any problem with sleep memory, concentration, appetite.  Patient denies anhedonia.  Patient denies high energy manic type episodes with racing thoughts.  He denies feeling irritable and angry.  Patient denies anxiety.  Patient denies homicidal ideation,  auditory and visual hallucinations.  Patient denies any paranoia.  Patient denies past psychiatric diagnoses or hospitalization.  Patient has never been on any psychotropic drugs.  Patient denies any past suicidal attempt.  Patient denies family history of mental illness.  Patient states has a son but doesn't want to go to him. He  has some friends.  Patient denies drinking alcohol, states he quit long time ago.  He states he was drinking 1 beer occasionally.  He denies using any illicit drugs.  Patient is homeless, Pt was living in a trailer but he lost his apartment due to not paying his rent.  Patient lives in truck.  Patient not interested in going to a shelter.  Patient states he needs help with housing. On examination, patient is alert, awake, partially oriented to self and time only.  Patient is not oriented to place and situation.  Patient states he is here to get some rest.  Patient does not know that he is in the hospital, states he is somewhere in [place]. Pt scored 8/30 on MOCA test.  With patient's consent, tried calling patient's son [name] @ [phone] answer Past Psychiatric History: None per chart  Risk to Self: Endorses SI with no plan. Contracts for safety at this time (SI is related to housing). Risk to Others:  no Prior Inpatient Therapy:  no Prior Outpatient Therapy:  no  Past Medical History:  Past Medical History:  Diagnosis Date   Cataract    Mixed form OU   Diabetes mellitus without  complication ([place])    Diabetic retinopathy ([place])    PDR OU   Hypertension    Hypertensive retinopathy    OU    Past Surgical History:  Procedure Laterality Date   BACK SURGERY     FINGER SURGERY     Family History: History reviewed. No pertinent family history. Family Psychiatric  History: none Social History:  Social History   Substance and Sexual Activity  Alcohol Use No     Social History   Substance and Sexual Activity  Drug Use Not Currently    Social  History   Socioeconomic History   Marital status: Widowed    Spouse name: Not on file   Number of children: 1   Years of education: 54   Highest education level: High school graduate  Occupational History   Not on file  Tobacco Use   Smoking status: Every Day    Packs/day: 0.50    Types: Cigarettes   Smokeless tobacco: Never  Vaping Use   Vaping Use: Never used  Substance and Sexual Activity   Alcohol use: No   Drug use: Not Currently   Sexual activity: Not Currently  Other Topics Concern   Not on file  Social History Narrative   Food insecurities - referred to [employer] for 7 diabetic, low sodium meals per week, for a total of 12 weeks, beginning on Friday, July 31st.  Patient was also provided [employer] [place].      Patient continues to states has no family, friends or support system.  States he has 3 children but does not know how to contact them.  Was able to provide the following information today   Son- Sharyn Blitz lives in [place] lives in [place] lives in [place]   Does not associate with neighbors   Goes to [employer] on [address]. Each Wed.  But states has no friends there   Social Determinants of Radio broadcast assistant Strain: Not on file  Food Insecurity: Food Insecurity Present   Worried About Charity fundraiser in the Last Year: Sometimes true   Arboriculturist in the Last Year: Sometimes true  Transportation Needs: Not on file  Physical Activity: Not on file  Stress: Not on file  Social Connections: Not on file   Additional Social History:    Allergies:  No Known Allergies  Labs:  Results for orders placed or performed during the hospital encounter of 05/07/21 (from the past 48 hour(s))  Glucose, capillary     Status: Abnormal   Collection Time: 05/13/21  8:06 AM  Result Value Ref Range   Glucose-Capillary 197 (H) 70 - 99 mg/dL    Comment: Glucose reference range applies  only to samples taken after fasting for at least 8 hours.  Basic metabolic panel     Status: Abnormal   Collection Time: 05/14/21  2:06 AM  Result Value Ref Range   Sodium 133 (L) 135 - 145 mmol/L   Potassium 4.1 3.5 - 5.1 mmol/L   Chloride 102 98 - 111 mmol/L   CO2 24 22 - 32 mmol/L   Glucose, Bld 110 (H) 70 - 99 mg/dL    Comment: Glucose reference range applies only to samples taken after fasting for at least 8 hours.   BUN 15 8 - 23 mg/dL   Creatinine, Ser 1.10 0.61 - 1.24 mg/dL   Calcium 8.8 (  L) 8.9 - 10.3 mg/dL   GFR, Estimated >60 >60 mL/min    Comment: (NOTE) Calculated using the CKD-EPI Creatinine Equation (2021)    Anion gap 7 5 - 15    Comment: Performed at [facility], [place] [address] [place], [place] [address]  Glucose, capillary     Status: Abnormal   Collection Time: 05/14/21  6:06 AM  Result Value Ref Range   Glucose-Capillary 110 (H) 70 - 99 mg/dL    Comment: Glucose reference range applies only to samples taken after fasting for at least 8 hours.    Current Facility-Administered Medications  Medication Dose Route Frequency Provider Last Rate Last Admin   acetaminophen (TYLENOL) tablet 650 mg  650 mg Oral Q6H PRN Lattie Haw, MD       aspirin EC tablet 81 mg  81 mg Oral Daily Espinoza, Alejandra, DO   81 mg at 05/14/21 0854   atorvastatin (LIPITOR) tablet 40 mg  40 mg Oral Daily Simmons-Robinson, Makiera, MD   40 mg at 05/14/21 0854   carvedilol (COREG) tablet 3.125 mg  3.125 mg Oral BID WC Simmons-Robinson, Makiera, MD   3.125 mg at 05/14/21 [id number]   Chlorhexidine Gluconate Cloth 2 % PADS 6 each  6 each Topical Daily Lind Covert, MD   6 each at 05/14/21 0855   enoxaparin (LOVENOX) injection 40 mg  40 mg Subcutaneous Q24H Espinoza, Alejandra, DO   40 mg at 05/13/21 1416   losartan (COZAAR) tablet 50 mg  50 mg Oral Daily Espinoza, Alejandra, DO   50 mg at 05/14/21 0854   metFORMIN (GLUCOPHAGE-XR) 24 hr tablet 500 mg  500 mg Oral BID WC  Simmons-Robinson, Makiera, MD   500 mg at 05/14/21 0854   nicotine (NICODERM CQ - dosed in mg/24 hours) patch 14 mg  14 mg Transdermal Daily Dameron, Marisa, DO   14 mg at 05/14/21 0854   tamsulosin (FLOMAX) capsule 0.4 mg  0.4 mg Oral Daily Simmons-Robinson, Makiera, MD   0.4 mg at 05/14/21 [id number]    Musculoskeletal: Strength & Muscle Tone: within normal limits Gait & Station:  Deferred Patient leans: N/A            Psychiatric Specialty Exam:  Presentation  General Appearance: Appropriate for Environment  Eye Contact:Fair  Speech:Normal Rate  Speech Volume:Normal  Handedness:Right   Mood and Affect  Mood:Dysphoric  Affect:Constricted   Thought Process  Thought Processes:Coherent; Linear  Descriptions of Associations:Intact  Orientation:Full (Time, Place and Person)  Thought Content:Logical  History of Schizophrenia/Schizoaffective disorder:No data recorded Duration of Psychotic Symptoms:No data recorded Hallucinations:Hallucinations: None  Ideas of Reference:None  Suicidal Thoughts:Suicidal Thoughts: Yes, Active SI Active Intent and/or Plan: With Intent; Without Plan SI is related to Housing needs. Pt contracts for safety at this time. States he will be safe if CSW can find a place for him.   Homicidal Thoughts:Homicidal Thoughts: No   Sensorium  Memory:Immediate Good; Remote Fair; Recent Fair  Judgment:Fair  Insight:Shallow   Executive Functions  Concentration:Good  Attention Span:Good  [place]   Psychomotor Activity  Psychomotor Activity:Psychomotor Activity: Normal   Assets  Assets:Communication Skills; Desire for Improvement   Sleep  Sleep:Sleep: Good   Physical Exam: Physical Exam Vitals and nursing note reviewed.  Constitutional:      General: He is not in acute distress.    Appearance: Normal appearance. He is not ill-appearing, toxic-appearing or diaphoretic.  HENT:      Head: Normocephalic and  atraumatic.  Pulmonary:     Effort: Pulmonary effort is normal.  Neurological:     General: No focal deficit present.     Mental Status: He is alert and oriented to person, place, and time.  Psychiatric:        Behavior: Behavior normal.   Review of Systems  Constitutional:  Negative for chills and fever.  Respiratory:  Negative for cough and shortness of breath.   Cardiovascular:  Negative for chest pain.  Gastrointestinal:  Negative for heartburn and nausea.  Neurological:  Negative for dizziness and headaches.  Psychiatric/Behavioral:  Positive for depression and suicidal ideas. Negative for hallucinations, memory loss and substance abuse. The patient is not nervous/anxious and does not have insomnia.        Suicidal thoughts with no plan(Related to housing).  Contracts for safety at this time.   SI with no plan. Contracts for safety. SI related to Housing.  Blood pressure 113/70, pulse 81, temperature 98 F (36.7 C), temperature source Oral, resp. rate 18, height '5\' 8"'$  (1.727 m), weight 110.2 kg, SpO2 99 %. Body mass index is 36.94 kg/m.  Treatment Plan Summary: Pt suicidal thoughts likely related to Homelessness. Pt contracts to safety at this time and want help from CSW to find housing.  Pt also scored poorly on MOCA test scoring 8/30.  -Patient does not meet criteria for inpatient psychiatric admission.  Patient suicidal thoughts are related to homelessness.  Patient contracts for safety at this time. -CSW to help primary team with disposition to appropriate place according to Pt's needs. CSW was informed via secure chat  Patient declined to go to shelter. -Due to patient's medical needs and H/o Dementia, SNF or a memory care unit would be more appropriate for safe disposition. - Pt will benefit from Neurology referral for full Dementia evaluation.  -Sitter can be discontinued. -Psychiatry will sign off.  Disposition: Patient does not meet criteria  for psychiatric inpatient admission. Supportive therapy provided about ongoing stressors. Discussed crisis plan, support from social network, calling 911, coming to the Emergency Department, and calling Suicide Hotline.  Armando Reichert, MD 05/14/2021 10:29 AM

## 2021-05-14 NOTE — Progress Notes (Signed)
FPTS Interim Progress Note  S: Resting comfortably in bed with no complaints.  O: BP (!) 105/45 (BP Location: Right Arm)   Pulse 84   Temp 98 F (36.7 C) (Oral)   Resp 18   Ht '5\' 8"'$  (1.727 m)   Wt 110.2 kg   SpO2 98%   BMI 36.94 kg/m   General: Alert and oriented to person, says place is a church and says the date is August 20.  Patient in no apparent distress Heart: Regular rate and rhythm with no murmurs appreciated Lungs: CTA bilaterally, no wheezing Abdomen: Bowel sounds present, no abdominal pain Skin: Warm and dry Extremities: No lower extremity edema  A/P: 70 year old male originally admitted for altered mental status who is currently medically stable for SNF placement.  He has no complaints at this time.  His Foley catheter remains in place due to urinary retention.  He was recently evaluated by psychiatry due to suicidality but is not expressing of this at this time.  He has no other complaints at this time.   Lurline Del, DO 05/14/2021, 9:01 PM PGY-3, [place] Medicine Service pager [phone]

## 2021-05-14 NOTE — TOC Progression Note (Signed)
Transition of Care [facility]) - Progression Note    Patient Details  Name: Charles Fox MRN: [id number] Date of Birth: October 20, 1950  [facility] [place] CM/SW [place], LCSW Phone Number: 05/14/2021, 4:19 PM  Clinical Narrative:    CSW still has not had return call from son. Patient does not qualify for SNF rehab due to being ambulatory. However, patient does not have Medicaid in place for long term care or ALF. He does not have the finances in place to cover the cost of care there. Patient could potentially afford a group or family care home however they likely not be able to accommodate a foley catheter.   MSW Intern contacted the following facilities:  -[facility]-no answer -[facility]- No beds available -[facility]- no answer -[facility]- no beds available -[facility]-not able to accept with catheters    Expected Discharge Plan: [place] Barriers to Discharge: Financial Resources, Homeless with medical needs, Unsafe home situation  Expected Discharge Plan and Services Expected Discharge Plan: [place] arrangements for the past 2 months: Homeless                                       Social Determinants of Health (SDOH) Interventions    Readmission Risk Interventions No flowsheet data found.

## 2021-05-15 DIAGNOSIS — R410 Disorientation, unspecified: Secondary | ICD-10-CM | POA: Diagnosis not present

## 2021-05-15 DIAGNOSIS — G9340 Encephalopathy, unspecified: Secondary | ICD-10-CM | POA: Diagnosis not present

## 2021-05-15 DIAGNOSIS — F0390 Unspecified dementia without behavioral disturbance: Secondary | ICD-10-CM

## 2021-05-15 DIAGNOSIS — N320 Bladder-neck obstruction: Secondary | ICD-10-CM | POA: Diagnosis not present

## 2021-05-15 DIAGNOSIS — R4182 Altered mental status, unspecified: Secondary | ICD-10-CM

## 2021-05-15 DIAGNOSIS — R339 Retention of urine, unspecified: Secondary | ICD-10-CM | POA: Diagnosis not present

## 2021-05-15 LAB — GLUCOSE, CAPILLARY
Glucose-Capillary: 117 mg/dL — ABNORMAL HIGH (ref 70–99)
Glucose-Capillary: 183 mg/dL — ABNORMAL HIGH (ref 70–99)
Glucose-Capillary: 110 mg/dL — ABNORMAL HIGH (ref 70–99)
Glucose-Capillary: 106 mg/dL — ABNORMAL HIGH (ref 70–99)
Glucose-Capillary: 113 mg/dL — ABNORMAL HIGH (ref 70–99)

## 2021-05-15 NOTE — Plan of Care (Signed)
  Problem: Education: Goal: Knowledge of General Education information will improve Description: Including pain rating scale, medication(s)/side effects and non-pharmacologic comfort measures Outcome: Progressing   Problem: Clinical Measurements: Goal: Ability to maintain clinical measurements within normal limits will improve Outcome: Progressing Goal: Will remain free from infection Outcome: Progressing Goal: Diagnostic test results will improve Outcome: Progressing Goal: Respiratory complications will improve Outcome: Progressing Goal: Cardiovascular complication will be avoided Outcome: Progressing   Problem: Nutrition: Goal: Adequate nutrition will be maintained Outcome: Progressing   Problem: Coping: Goal: Level of anxiety will decrease Outcome: Progressing   

## 2021-05-15 NOTE — TOC Progression Note (Signed)
Transition of Care [facility]) - Progression Note    Patient Details  Name: Charles Fox MRN: [id number] Date of Birth: [birth year]  Transition of Care [facility]) CM/SW [place], [place] Phone Number: 05/15/2021, 10:34 AM  Clinical Narrative:    CSW was consulted by Dorris Singh, MD concerning pt's disposition planning. Pt has a foley which is a barrier to placement, homeless and lack of financial resources.  Dorris Singh, MD would like for financial counseling to start medicaid process on 8/29 for pt.  TOC will continue to assist with disposition planning.   Expected Discharge Plan: [place] Barriers to Discharge: Financial Resources, Homeless with medical needs, Unsafe home situation  Expected Discharge Plan and Services Expected Discharge Plan: [place] arrangements for the past 2 months: Homeless                                       Social Determinants of Health (SDOH) Interventions    Readmission Risk Interventions No flowsheet data found.

## 2021-05-16 DIAGNOSIS — R4182 Altered mental status, unspecified: Secondary | ICD-10-CM | POA: Diagnosis not present

## 2021-05-16 DIAGNOSIS — N320 Bladder-neck obstruction: Secondary | ICD-10-CM | POA: Diagnosis not present

## 2021-05-16 DIAGNOSIS — F0390 Unspecified dementia without behavioral disturbance: Secondary | ICD-10-CM | POA: Diagnosis not present

## 2021-05-16 DIAGNOSIS — R410 Disorientation, unspecified: Secondary | ICD-10-CM | POA: Diagnosis not present

## 2021-05-16 DIAGNOSIS — G9340 Encephalopathy, unspecified: Secondary | ICD-10-CM | POA: Diagnosis not present

## 2021-05-16 DIAGNOSIS — R339 Retention of urine, unspecified: Secondary | ICD-10-CM | POA: Diagnosis not present

## 2021-05-16 LAB — GLUCOSE, CAPILLARY
Glucose-Capillary: 115 mg/dL — ABNORMAL HIGH (ref 70–99)
Glucose-Capillary: 118 mg/dL — ABNORMAL HIGH (ref 70–99)

## 2021-05-16 NOTE — Progress Notes (Signed)
FPTS Interim Progress Note  S: Resting comfortably  O: BP 121/64 (BP Location: Left Arm)   Pulse 69   Temp 98.7 F (37.1 C) (Oral)   Resp 18   Ht '5\' 8"'$  (1.727 m)   Wt 103.7 kg   SpO2 98%   BMI 34.76 kg/m    A/P: 70 year old male currently medically stable awaiting placement which is difficult due to history of homelessness, dementia, Foley catheter, and absence of Medicaid.  He is resting comfortably at this time with the vitals reviewed as above.  No concerns currently.  Lurline Del, DO 05/16/2021, 1:35 AM PGY-3, [place] Medicine Service pager [phone]

## 2021-05-16 NOTE — Progress Notes (Signed)
Family Medicine Teaching Service Daily Progress Note Intern Pager: [phone]  Patient name: Charles Fox Medical record number: [id number] Date of birth: [birth year] Age: 70 y.o. Gender: male  Primary Care Provider: [name], MD Consultants: Psychiatry (s/o) Code Status: Full  [name]:  8/19: admitted for AMS, hematuria 8/20: improved mentation, continued hematuria, normal brain MRI 8/21: [place] 12/30, determined not to have capacity 8/22: discussion with son revealed pt does not have safe place for discharge regarding family 8/23: PSA elevated. Urology recommend keeping catheter and rechecking PSA in 1 month 8/24: voiding trial 8/25: suicide precautions, voiding trial failed, foley reinserted  Assessment and Plan: Charles Fox is a 70 y/o M admitted for altered mental status with unremarkable head imaging.  He is now medically stable for placement versus been difficult due to his history of homelessness and Foley catheter as well as absence of Medicaid.  AMS 2/2 worsening dementia  housing insecurity/homelessness  agitation Patient was agitated last night to the point of cornering intern and nurse in the room and appearing violent.  Soft restraints and Seroquel 50 mg were given.  Patient is limited in conversation today but responds to stimulus.  EKG revealed normal sinus rhythm, no concern for QT prolongation.  Patient is currently medically stable and awaiting placement was difficult due to his history noted above.  He does not have Medicaid and this is currently being worked on by social work. -Seroquel 25 mg at night -Neurology referral outpatient for dementia evaluation -TOC assistance with placement, appreciate efforts -Continue PT  Urinary retention Patient still has Foley catheter in place.  Will attempt voiding trial on September 1-2 as Foley catheter is a barrier for group facility placement. -Continue Flomax 0.4 mg daily -Urology  follow-up outpatient  FEN/GI: Heart healthy/carb modified PPx: Lovenox Dispo: Awaiting placement .  Medically stable at this time. Barriers include homelessness, Foley catheter, lack of Medicaid.   Subjective:  Patient disinterested in conversation.  Will squeeze hand when prompted and open eyes when prompted.  Denies pain anywhere.  Objective: Temp:  [98 F (36.7 C)-98.6 F (37 C)] 98.6 F (37 C) (08/28 2007) Pulse Rate:  [74-84] 79 (08/28 2007) Resp:  [16-18] 18 (08/28 2007) BP: (113-150)/(65-79) 127/72 (08/28 2007) SpO2:  [96 %-99 %] 96 % (08/28 2007) Weight:  [104 kg] 104 kg (08/28 [id number]) Physical Exam: General: Conscious, disinterested in conversation with eyes closed, no acute distress Cardiovascular: RRR, no murmurs auscultated Respiratory: CTA B, no increased work of breathing Abdomen: Soft, nontender, normoactive bowel sounds Extremities: 2+ radial pulses  Laboratory: Recent Labs  Lab 05/10/21 0339  WBC 8.7  HGB 13.8  HCT 42.5  PLT 303   Recent Labs  Lab 05/10/21 0339 05/14/21 0206  NA 133* 133*  K 4.0 4.1  CL 100 102  CO2 22 24  BUN 10 15  CREATININE 0.81 1.10  CALCIUM 8.6* 8.8*  GLUCOSE 85 110*   CBG (last 3)  Recent Labs    05/15/21 2030 05/16/21 0558 05/16/21 1625  GLUCAP 113* 115* 118*     Imaging/Diagnostic Tests: No new imaging/diagnostic tests at this time.  [name], DO 05/16/2021, 10:36 PM PGY-1, [place] Intern pager: [phone], text pages welcome

## 2021-05-16 NOTE — Progress Notes (Signed)
Family Medicine Teaching Service Daily Progress Note Intern Pager: [phone]  Patient name: Charles Fox Medical record number: [id number] Date of birth: 15-Mar-1951 Age: 70 y.o. Gender: male  Primary Care Provider: [name], MD Consultants: Psychiatry (signed off) Code Status: Full  [name]:  8/19: admitted for AMS, hematuria 8/20: improved mentation, continued hematuria, normal brain MRI 8/21: [place] 12/30, determined not to have capacity 8/22: discussion with son revealed pt does not have safe place for discharge regarding family 8/23: PSA elevated. Urology recommend keeping catheter and rechecking PSA in 1 month 8/24: voiding trial 8/25: suicide precautions, voiding trial failed, foley reinserted  Assessment and Plan: 70 year old male admitted for altered mental status with unremarkable head imaging.  He is now medically stable for placement this difficult to place due to his history of homelessness and Foley catheter which is in place as well as absence Medicaid.  Altered mental status secondary to worsening dementia  housing insecurity/homelessness Currently medical stable.  Patient is awaiting placement but is difficult to place due to his history as noted above.  He does not have Medicaid and we are working on getting this as this may open doors for placement. -Neurology referral outpatient for dementia evaluation -Appreciate TOC assistance with placement -Continue PT -We will check labs on Monday  Hypotension-resolved Noted to be mildly hypotensive last night.  BP normotensive at this time with most recent 121/64.  Continues on Coreg 3.125 twice daily and Cozaar 50 mg daily. -Continue to monitor -Continue current medications  Urinary retention Still has Foley catheter in place with no complaints at this time. -Continue his Flomax -Urology follow-up in the outpatient setting  Suicidality-resolved No suicidal ideation at this time.  The  patient has been evaluated by psychiatry during the hospitalization earlier due to [place]. -Continue to monitor  FEN/GI: Heart healthy/carb modified PPx: Lovenox Dispo: Awaiting placement   Barriers include homelessness, Foley catheter, lack of Medicaid.   Subjective:  Patient denies complaints or concerns this morning.  Objective: Temp:  [98 F (36.7 C)-98.7 F (37.1 C)] 98.7 F (37.1 C) (08/27 2045) Pulse Rate:  [68-81] 69 (08/27 2045) Resp:  [16-18] 18 (08/27 2045) BP: (121-136)/(63-75) 121/64 (08/27 2045) SpO2:  [95 %-100 %] 98 % (08/27 2045) Weight:  [103.7 kg] 103.7 kg (08/27 0349) Physical Exam: General: Elderly male lying in bed in no acute distress Cardiovascular: RRR, no murmur Respiratory: CTA bilaterally Abdomen: No pain to palpation Extremities: No edema of the lower extremities  Laboratory: Recent Labs  Lab 05/10/21 0339  WBC 8.7  HGB 13.8  HCT 42.5  PLT 303   Recent Labs  Lab 05/10/21 0339 05/14/21 0206  NA 133* 133*  K 4.0 4.1  CL 100 102  CO2 22 24  BUN 10 15  CREATININE 0.81 1.10  CALCIUM 8.6* 8.8*  GLUCOSE 85 110*      Lurline Del, DO 05/16/2021, 1:37 AM PGY-3, [place] Intern pager: [phone], text pages welcome

## 2021-05-16 NOTE — Progress Notes (Signed)
FPTS Brief Progress Note  S: Resting comfortably in bed.  Patient asked if his "girlfriend Joelene Millin" could stay the night in the hospital with him.  No complaints.  RN voiced no concerns.   O: BP 127/72 (BP Location: Left Arm)   Pulse 79   Temp 98.6 F (37 C) (Oral)   Resp 18   Ht '5\' 8"'$  (1.727 m)   Wt 104 kg   SpO2 96%   BMI 34.86 kg/m   General: Well-appearing, in no distress Respiratory: Normal work of breathing  A/P: Plan per day team  - Orders reviewed. Labs for AM ordered, which was adjusted as needed.    Orvis Brill, DO 05/16/2021, 11:07 PM PGY-1, [place] [facility] Medicine Night Resident  Please page [phone] with questions.

## 2021-05-17 ENCOUNTER — Other Ambulatory Visit: Payer: Self-pay

## 2021-05-17 DIAGNOSIS — R4182 Altered mental status, unspecified: Secondary | ICD-10-CM | POA: Diagnosis not present

## 2021-05-17 LAB — BASIC METABOLIC PANEL
Anion gap: 11 (ref 5–15)
BUN: 13 mg/dL (ref 8–23)
CO2: 19 mmol/L — ABNORMAL LOW (ref 22–32)
Calcium: 8.6 mg/dL — ABNORMAL LOW (ref 8.9–10.3)
Chloride: 104 mmol/L (ref 98–111)
Creatinine, Ser: 0.92 mg/dL (ref 0.61–1.24)
GFR, Estimated: >60 mL/min (ref >60)
Glucose, Bld: 108 mg/dL — ABNORMAL HIGH (ref 70–99)
Potassium: 4 mmol/L (ref 3.5–5.1)
Sodium: 134 mmol/L — ABNORMAL LOW (ref 135–145)

## 2021-05-17 LAB — GLUCOSE, CAPILLARY: Glucose-Capillary: 126 mg/dL — ABNORMAL HIGH (ref 70–99)

## 2021-05-17 LAB — CBC
HCT: 44.5 % (ref 39.0–52.0)
Hemoglobin: 14.8 g/dL (ref 13.0–17.0)
MCH: 29.4 pg (ref 26.0–34.0)
MCHC: 33.3 g/dL (ref 30.0–36.0)
MCV: 88.5 fL (ref 80.0–100.0)
Platelets: 356 10*3/uL (ref 150–400)
RBC: 5.03 MIL/uL (ref 4.22–5.81)
RDW: 13.3 % (ref 11.5–15.5)
WBC: 10.2 10*3/uL (ref 4.0–10.5)
nRBC: 0 % (ref 0.0–0.2)

## 2021-05-17 MED ORDER — QUETIAPINE FUMARATE 25 MG PO TABS: 25.0000 mg | ORAL_TABLET | Freq: Every day | ORAL | Status: DC

## 2021-05-17 MED ORDER — QUETIAPINE FUMARATE 50 MG PO TABS
50.0000 mg | ORAL_TABLET | Freq: Once | ORAL | Status: AC
  Administered 2021-05-17: 50 mg via ORAL
  Filled 2021-05-17: qty 1

## 2021-05-17 MED ORDER — QUETIAPINE FUMARATE 25 MG PO TABS: 25.0000 mg | ORAL_TABLET | ORAL | Status: DC

## 2021-05-17 MED ORDER — QUETIAPINE FUMARATE 50 MG PO TABS
50.0000 mg | ORAL_TABLET | ORAL | Status: DC
  Administered 2021-05-17 – 2021-05-22 (×6): 50 mg via ORAL
  Filled 2021-05-17 (×6): qty 1

## 2021-05-17 MED ORDER — QUETIAPINE FUMARATE 50 MG PO TABS: 50.0000 mg | ORAL_TABLET | Freq: Every day | ORAL | Status: DC

## 2021-05-17 NOTE — Progress Notes (Signed)
2030- spoke with Dr. Owens Shark and Dr. Posey Pronto during evening rounds. Discussed importance of clustering patient care to decrease incidents of aggravation due to violent history. Vitals are to be taken at the beginning and end of shift to promote patient rest

## 2021-05-17 NOTE — Progress Notes (Signed)
PT Cancellation Note  Patient Details Name: Charles Fox MRN: [id number] DOB: 1951/02/20   Cancelled Treatment:    Reason Eval/Treat Not Completed: Fatigue/lethargy limiting ability to participate - pt sleeping upon arrival, barely wakes for PT but does state "not now" for therapy. Will check back as schedule allows.  Stacie Glaze, PT DPT Acute Rehabilitation Services Pager [phone]  Office [phone]    [place] 05/17/2021, 10:08 AM

## 2021-05-17 NOTE — Progress Notes (Addendum)
FPTS Brief Progress Note  S Saw patient at bedside this evening. Patient was resting comfortably in bed. He was in no distress with lights off, laying on his side in the bed. Note on door saying to speak with RN prior to entering room. I also rounded with primary night RN Caryl Pina, who voiced no concerns- appreciate her care greatly.   O: BP (!) 149/80 (BP Location: Left Arm)   Pulse 86   Temp 97.9 F (36.6 C) (Oral)   Resp 16   Ht '5\' 8"'$  (1.727 m)   Wt 104 kg   SpO2 99%   BMI 34.86 kg/m    General: No distress  A/P: Plan per day team  Delirium precautions Seroquel '50mg'$  nightly RN to page if any continued agitation/combativeness/concerns  - Orders reviewed. Labs for AM ordered, which was adjusted as needed.    Orvis Brill, DO 05/17/2021, 8:15 PM PGY-1, [facility] Night Resident  Please page [phone] with questions.

## 2021-05-17 NOTE — Progress Notes (Signed)
Interim Progress Note- Late Entry  Discussed with charge RN earlier this morning regarding patient's violent outburst overnight.  Feel that patient was likely sundowning but given his agitation and need for restraints, would like the whole team to be aware of this.  Please use caution when interacting with the patient.  It would be advised to leave the door open when talking with and examining the patient. Charge RN stated she would look into making a flag on his chart for this history of violence/agitation so that we can further protect those involved in his care.

## 2021-05-17 NOTE — Progress Notes (Addendum)
0200- Patient has become insistent on leaving. Pushing at staff in attempt to leave the room. Dr. Owens Shark and Dr. Posey Pronto at the bedside to assess the patient. Bilateral soft wrist restraints ordered, one time dose of oral Seroquel ordered and given,    [id number] Patient sleeping comfortably. EKG completed and placed in patient chart. Bilateral soft wrist restraint orders are to expire at 0730, restraints removed at this time

## 2021-05-17 NOTE — Progress Notes (Addendum)
FPTS Brief Progress Note  S:RN paged for agitation and patient attempting to leave the room. Upon entering the room, full room lights were on and Mr. Metzker was sitting upright in the bed with RN and NT standing on either side of him. He was saying he wanted to leave. Patient said he was hungry and I offered him a snack to which he said he wanted a "cheeseburger, onion rings on my way out the door." I attempted to explain that it was time to sleep, dimmed the lights, and told him that he was in the hospital and needed to be here for safety reasons. RN left the room. NT and I were then cornered by patient near the room door and NT shut the room door. Patient grew more combative and tried to push NT and myself out of the way. I opened the door, RN returned to the room. Soft wrist restraints were ordered and placed with 4 staff members while patient remained agitated and combative. Lights were dimmed and patient took '50mg'$  PO Seroquel. Patient was non-combative and laying in bed with restraints in place upon my exit.  O: BP (!) 137/56 (BP Location: Right Arm)   Pulse 65   Temp 97.8 F (36.6 C) (Oral)   Resp 18   Ht '5\' 8"'$  (1.727 m)   Wt 104 kg   SpO2 97%   BMI 34.86 kg/m     A/P: -Delirium precautions -Soft wrist restraints temporarily placed, end time 0700 -RN to page FPTS if patient becomes agitated further, will consider PO or IM haldol -Re-direct and de-escalate as first line choice prior to medical management  Orvis Brill, DO 05/17/2021, 2:21 AM PGY-1, [facility] Night Resident  Please page [phone] with questions.

## 2021-05-17 NOTE — Progress Notes (Signed)
Family Medicine Teaching Service Daily Progress Note Intern Pager: [phone]  Patient name: Charles Fox Medical record number: [id number] Date of birth: 05/12/1951 Age: 70 y.o. Gender: male  Primary Care Provider: [name], MD Consultants: Psychiatry (s/o) Code Status: Full  [name]:  8/19: admitted for AMS, hematuria 8/20: improved mentation, continued hematuria, normal brain MRI 8/21: [facility] 12/30, determined not to have capacity 8/22: discussion with son revealed pt does not have safe place for discharge regarding family 8/23: PSA elevated. Urology recommend keeping catheter and rechecking PSA in 1 month 8/24: voiding trial 8/25: suicide precautions, voiding trial failed, foley reinserted 8/26: suicide precautions removed 8/29: agitated and combative, delirium precautions placed  Assessment and Plan: Charles Fox is a 70 y/o M admitted for AMS with unremarkable head imaging. He is medically stable for placement which has been complicated by hx of homelessness and Foley catheter as well as absence of Medicaid.   AMS 2/2 worsening dementia  housing insecurity/homelessness  agitation Currently medically stable and awaiting placement which has proven difficult due to social circumstances.  -Continue Seroquel '50mg'$  nightly -Neurology referral outpatient for dementia evaluation -TOC assistance with placement, appreciate efforts -continue PT as appropriate  Urinary retention Foley catheter in place. Voiding trial to be repeated in a 1-2 days as Foley catheter is a barrier for facility placement.  -continue flomax 0.'4mg'$  daily -urology follow-up outpatient   FEN/GI: Heart healthy/carb modified PPx: Lovenox Dispo:SNF  today . Barriers include placement and insurance, medically stable for discharge when able.   Subjective:  Patient adamantly wants to go home.  States he does not have family that he talks to and he just wants to get out and go  about his business.  Objective: Temp:  [97.8 F (36.6 C)-98.1 F (36.7 C)] 97.8 F (36.6 C) (08/30 1100) Pulse Rate:  [85-92] 92 (08/30 1100) Resp:  [14-18] 14 (08/30 1100) BP: (123-153)/(78-88) 123/88 (08/30 1100) SpO2:  [99 %-100 %] 99 % (08/30 1100) Physical Exam: General: Awake, alert, no acute distress, frustrated but calm Cardiovascular: RRR, no murmurs auscultated Respiratory: CTA B, no increased work of breathing Abdomen: Soft, nontender, normoactive bowel sounds  Laboratory: Recent Labs  Lab 05/17/21 0203  WBC 10.2  HGB 14.8  HCT 44.5  PLT 356   Recent Labs  Lab 05/14/21 0206 05/17/21 0203  NA 133* 134*  K 4.1 4.0  CL 102 104  CO2 24 19*  BUN 15 13  CREATININE 1.10 0.92  CALCIUM 8.8* 8.6*  GLUCOSE 110* 108*    Imaging/Diagnostic Tests: No new imaging/diagnostic tests at this time.  [name], DO 05/18/2021, 12:50 PM PGY-1, [place] Intern pager: [phone], text pages welcome

## 2021-05-18 DIAGNOSIS — R4182 Altered mental status, unspecified: Secondary | ICD-10-CM | POA: Diagnosis not present

## 2021-05-18 DIAGNOSIS — F0391 Unspecified dementia with behavioral disturbance: Secondary | ICD-10-CM | POA: Diagnosis not present

## 2021-05-18 LAB — GLUCOSE, CAPILLARY: Glucose-Capillary: 91 mg/dL (ref 70–99)

## 2021-05-18 NOTE — Progress Notes (Signed)
   05/18/21 1500  Clinical Encounter Type  Visited With Patient (On the phone)  Visit Type Follow-up;Spiritual support  Referral From Nurse  Consult/Referral To Chaplain   This Chaplain called the unit secretary and allowed the patient to use the unit's telephone. This Chaplain actively listened as the patient spoke of feeling like a lonely old man. He said he cries often. He said his wife died about 7 years ago. 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 He feels like no one will hire him at his age. The patient said he has a son, Charles Fox. The patient said he has 4 or 5 children but doesn't have anything to do with them. The patient said he wanted his son to visit and asked if this Chaplain could call his son. This Chaplain advised him I would inform the nurse of his request. The patient is not sure if his son knows that he is in the hospital. The patient said he does not want to see his uncle, Charles Fox. It appears to this Chaplain that the patient does not have a healthy relationship with his family. The patient is concerned about where he will go after he is discharged. He said he receives disability, pension, or SSI.  The patient said he would like to have a Bible. This Chaplain ended the call with prayer.This note was prepared by Jeanine Luz, M.Div..  For questions please contact by phone [phone].

## 2021-05-18 NOTE — Progress Notes (Signed)
FPTS Brief Progress Note  S Saw patient at bedside this evening. Patient was sleeping comfortably. I did not wake the patient.   O: BP 118/72 (BP Location: Left Arm)   Pulse 88   Temp 97.8 F (36.6 C) (Oral)   Resp 16   Ht '5\' 8"'$  (1.727 m)   Wt 104 kg   SpO2 96%   BMI 34.86 kg/m    General: No acute distress, resting comfortably  A/P: Plan per day team  - Orders reviewed. Labs for AM ordered, which was adjusted as needed.    Orvis Brill, DO 05/18/2021, 9:45 PM PGY-1, [facility] Night Resident  Please page [phone] with questions.

## 2021-05-18 NOTE — Progress Notes (Signed)
Family Medicine Teaching Service Daily Progress Note Intern Pager: [phone]  Patient name: Charles Fox Medical record number: [id number] Date of birth: 10-11-1950 Age: 70 y.o. Gender: male  Primary Care Provider: [name], MD Consultants:  Psychiatry (s/o) Code Status: Full  [name]:  8/19: admitted for AMS, hematuria 8/20: improved mentation, continued hematuria, normal brain MRI 8/21: [facility] 12/30, determined not to have capacity 8/22: discussion with son revealed pt does not have safe place for discharge regarding family 8/23: PSA elevated. Urology recommend keeping catheter and rechecking PSA in 1 month 8/24: voiding trial 8/25: suicide precautions, voiding trial failed, foley reinserted 8/26: suicide precautions removed 8/29: agitated and combative, delirium precautions placed  Assessment and Plan: Charles Fox is a 70 y/o M admitted for AMS with unremarkable head imaging.  He is medically stable for placement which has been complicated by history of homelessness and Foley catheter as well as absence of Medicaid.  AMS 2/2 worsening dementia  housing insecurity/homelessness  agitation Currently medically stable and awaiting placement is proven difficult due to social circumstances.  Patient still wants to leave hospital but is redirectable to SNF plan.  Discussion with TOC and attempts to get patient on difficult to place list. -Continue Seroquel 50 mg nightly -Neurology referral outpatient for dementia evaluation -TOC assistance with placement, appreciate efforts -Continue PT as appropriate  Urinary retention Foley catheter in place.  Voiding trial to be repeated tomorrow as Foley catheter is a barrier for facility placement. -Continue Flomax 0.4 mg daily -Urology follow-up outpatient  T2DM CBGs have been stable, most recently 118. -Continue metformin '500mg'$  BID -Discontinue POCT CBG checks   FEN/GI: Heart healthy/carb  modified PPx: Lovenox Dispo: Long-term care facility today. Barriers include placement, medically stable for discharge.   Subjective:  Patient still wants to leave but does not have any other questions.  Objective: Temp:  [97.8 F (36.6 C)-98.7 F (37.1 C)] 98.7 F (37.1 C) (08/31 1128) Pulse Rate:  [88-89] 89 (08/31 1128) Resp:  [16-20] 18 (08/31 1128) BP: (118-153)/(72-90) 131/72 (08/31 1128) SpO2:  [96 %-98 %] 98 % (08/31 1128) Physical Exam: General: Awake, alert, conversational when prompted, no acute distress Cardiovascular: RRR, no murmurs auscultated Respiratory: CTA B, no increased work of breathing Abdomen: Soft, nondistended, normoactive bowel sounds  Laboratory: Recent Labs  Lab 05/17/21 0203  WBC 10.2  HGB 14.8  HCT 44.5  PLT 356   Recent Labs  Lab 05/14/21 0206 05/17/21 0203  NA 133* 134*  K 4.1 4.0  CL 102 104  CO2 24 19*  BUN 15 13  CREATININE 1.10 0.92  CALCIUM 8.8* 8.6*  GLUCOSE 110* 108*    Imaging/Diagnostic Tests: No new imaging/diagnostic tests at this time.  [name], DO 05/19/2021, 1:29 PM PGY-1, [place] Intern pager: [phone], text pages welcome

## 2021-05-18 NOTE — Progress Notes (Signed)
Physical Therapy Treatment Patient Details Name: Charles Fox MRN: [id number] DOB: 03/14/1951 Today's Date: 05/18/2021    History of Present Illness 70 yr old male who presents with AMS and hematuria/urinary retention (foley placed 8/15), diagnosis of UTI. PMH includes diabetes, CVA  hypertension, hyperlipidemia, homelessness, tobacco use disorder.    PT Comments    Patient ambulating with rollator with cues for safe transfers. Patient able to maneuver through tight spaces in his room with rollator without needing cues or assist.  By end of session, pt reporting moderate fatigue and did not want to attempt ambulation without rollator. States interest in attempting without rollator during next visit "when not so tired."   Follow Up Recommendations  Home health PT (at ALF vs Group home (pt refusing shelter))     Equipment Recommendations  Other (comment) (rollator)    Recommendations for Other Services OT consult     Precautions / Restrictions Precautions Precautions: Fall Precaution Comments: foley cath    Mobility  Bed Mobility Overal bed mobility: Needs Assistance Bed Mobility: Supine to Sit     Supine to sit: Supervision Sit to supine: Supervision        Transfers Overall transfer level: Needs assistance Equipment used: Rolling walker (2 wheeled) Transfers: Sit to/from Stand Sit to Stand: Supervision Stand pivot transfers: Min guard       General transfer comment: cues for safe hand placement/use of rollator during transfers  Ambulation/Gait Ambulation/Gait assistance: Min guard Gait Distance (Feet): 300 Feet Assistive device: 4-wheeled walker Gait Pattern/deviations: Step-through pattern Gait velocity: reduced   General Gait Details: maneuvers around obstacles with supervision, but no assist or cues needed (near need for assist when became tangled with rollator and bedside table legs). Pt maneuvered out of situation without cues or assist   Stairs              Wheelchair Mobility    Modified Rankin (Stroke Patients Only)       Balance Overall balance assessment: Needs assistance Sitting-balance support: No upper extremity supported;Feet supported Sitting balance-Leahy Scale: Good     Standing balance support: No upper extremity supported Standing balance-Leahy Scale: Fair Standing balance comment:  (periods of one UE for support when standing at sink)                            Cognition Arousal/Alertness: Awake/alert Behavior During Therapy: Impulsive Overall Cognitive Status: Impaired/Different from baseline Area of Impairment: Safety/judgement;Memory;Attention                 Orientation Level: Person;Place Current Attention Level: Selective Memory: Decreased short-term memory   Safety/Judgement: Decreased awareness of safety;Decreased awareness of deficits Awareness: Intellectual Problem Solving: Requires verbal cues General Comments:  (patient states, "why don't you tell them I can leave.")      Exercises      General Comments General comments (skin integrity, edema, etc.): pt distracted by the fact that he has no shoes (and wondering where his shoes have gone) and where he is going to live when he leaves the hospital      Pertinent Vitals/Pain Pain Assessment: No/denies pain Pain Score: 2  Faces Pain Scale: Hurts a little bit Pain Location:  (lower back) Pain Descriptors / Indicators: Aching Pain Intervention(s): Repositioned    Home Living                      Prior Function  PT Goals (current goals can now be found in the care plan section) Acute Rehab PT Goals Patient Stated Goal: to get better Time For Goal Achievement: 05/23/21 Potential to Achieve Goals: Fair Progress towards PT goals: Progressing toward goals    Frequency    Min 3X/week      PT Plan Current plan remains appropriate    Co-evaluation              AM-PAC PT "6  Clicks" Mobility   Outcome Measure  Help needed turning from your back to your side while in a flat bed without using bedrails?: A Little Help needed moving from lying on your back to sitting on the side of a flat bed without using bedrails?: A Little Help needed moving to and from a bed to a chair (including a wheelchair)?: A Little Help needed standing up from a chair using your arms (e.g., wheelchair or bedside chair)?: A Little Help needed to walk in hospital room?: A Little Help needed climbing 3-5 steps with a railing? : A Little 6 Click Score: 18    End of Session Equipment Utilized During Treatment: Other (comment) (pt refused use of gait belt) Activity Tolerance: Patient tolerated treatment well Patient left: with call bell/phone within reach;in chair;with chair alarm set Nurse Communication: Mobility status PT Visit Diagnosis: Unsteadiness on feet (R26.81);Other abnormalities of gait and mobility (R26.89);Difficulty in walking, not elsewhere classified (R26.2) Pain - part of body:  (low back and cathter site)     Time: [id number] PT Time Calculation (min) (ACUTE ONLY): 20 min  Charges:  $Gait Training: 8-22 mins                      Arby Barrette, PT Pager [phone]    Rexanne Mano 05/18/2021, 12:53 PM

## 2021-05-18 NOTE — Progress Notes (Signed)
Occupational Therapy Treatment Patient Details Name: Charles Fox MRN: [id number] DOB: May 11, 1951 Today's Date: 05/18/2021    History of present illness 70 yr old male who presents with AMS and hematuria/urinary retention (foley placed 8/15), diagnosis of UTI. PMH includes diabetes, CVA  hypertension, hyperlipidemia, homelessness, tobacco use disorder.   OT comments  Patient seen for OT to address self care and safety. Patient was able to get to eob and bathed feet and donned non-skid socks, with assistance for left sock. Patient demonstrated impulsiveness during self care standing at sink with frequent cues for safety and rw use with mobility. OTA encouraged patient to sit in recliner but wanted to return to bed.  Nursing informed on impulsive behavior.  OT to follow in acute setting.  Follow Up Recommendations  SNF;Supervision/Assistance - 24 hour    Equipment Recommendations       Recommendations for Other Services      Precautions / Restrictions Precautions Precautions: Fall Precaution Comments: foley cath       Mobility Bed Mobility Overal bed mobility: Needs Assistance Bed Mobility: Supine to Sit     Supine to sit: Supervision Sit to supine: Supervision        Transfers Overall transfer level: Needs assistance Equipment used: Rolling walker (2 wheeled) Transfers: Sit to/from Stand Sit to Stand: Supervision Stand pivot transfers: Min guard       General transfer comment:  (min guard for safety due to impulsiveness)    Balance Overall balance assessment: Needs assistance Sitting-balance support: No upper extremity supported;Feet supported Sitting balance-Leahy Scale: Good     Standing balance support: No upper extremity supported Standing balance-Leahy Scale: Fair Standing balance comment:  (periods of one UE for support when standing at sink)                           ADL either performed or assessed with clinical judgement   ADL  Overall ADL's : Needs assistance/impaired     Grooming: Wash/dry hands;Wash/dry face;Applying deodorant;Supervision/safety;Set up;Cueing for safety;Standing Grooming Details (indicate cue type and reason):  (Patient spontaneous/impulsive requiring vcs for steps) Upper Body Bathing: Set up;Sitting;Supervision/ safety   Lower Body Bathing: Supervison/ safety;Sitting/lateral leans Lower Body Bathing Details (indicate cue type and reason):  (bathed feet seated on EOB) Upper Body Dressing : Supervision/safety;Set up;Sitting Upper Body Dressing Details (indicate cue type and reason):  (donned gown) Lower Body Dressing: Sit to/from stand;Minimal assistance Lower Body Dressing Details (indicate cue type and reason):  (Donned non-skid socks with min assist for left sock)             Functional mobility during ADLs: Min guard General ADL Comments: pt. uses cross leg technique for adls.     Vision       Perception     Praxis      Cognition Arousal/Alertness: Awake/alert Behavior During Therapy: Impulsive Overall Cognitive Status: Impaired/Different from baseline Area of Impairment: Safety/judgement;Memory;Attention                 Orientation Level: Person;Place Current Attention Level: Selective Memory: Decreased short-term memory   Safety/Judgement: Decreased awareness of safety;Decreased awareness of deficits Awareness: Intellectual Problem Solving: Requires verbal cues General Comments:  (patient states, "why don't you tell them I can leave.")        Exercises     Shoulder Instructions       General Comments      Pertinent Vitals/ Pain  Pain Assessment: Faces Pain Score: 2  Faces Pain Scale: Hurts a little bit Pain Location:  (lower back) Pain Descriptors / Indicators: Aching Pain Intervention(s): Repositioned  Home Living                                          Prior Functioning/Environment              Frequency  Min  2X/week        Progress Toward Goals  OT Goals(current goals can now be found in the care plan section)  Progress towards OT goals: Progressing toward goals  Acute Rehab OT Goals Patient Stated Goal: to go home OT Goal Formulation: With patient Time For Goal Achievement: 05/24/21 Potential to Achieve Goals: Fair ADL Goals Pt Will Perform Grooming: with modified independence;standing Pt Will Perform Upper Body Bathing: with modified independence;sitting Pt Will Perform Lower Body Bathing: with modified independence;sit to/from stand Pt Will Perform Upper Body Dressing: with modified independence;sitting Pt Will Perform Lower Body Dressing: with modified independence;sit to/from stand Pt Will Transfer to Toilet: with modified independence;ambulating Pt Will Perform Toileting - Clothing Manipulation and hygiene: with modified independence;sit to/from stand  Plan Discharge plan remains appropriate    Co-evaluation                 AM-PAC OT "6 Clicks" Daily Activity     Outcome Measure   Help from another person eating meals?: None Help from another person taking care of personal grooming?: A Little Help from another person toileting, which includes using toliet, bedpan, or urinal?: A Little Help from another person bathing (including washing, rinsing, drying)?: A Little Help from another person to put on and taking off regular upper body clothing?: A Little Help from another person to put on and taking off regular lower body clothing?: A Little 6 Click Score: 19    End of Session Equipment Utilized During Treatment: Gait belt;Rolling walker  OT Visit Diagnosis: Unsteadiness on feet (R26.81)   Activity Tolerance Patient tolerated treatment well   Patient Left in bed;with call bell/phone within reach;with bed alarm set;with nursing/sitter in room   Nurse Communication Mobility status;Other (comment) (informed on impulsiveness)        Time: [id number] OT Time  Calculation (min): 44 min  Charges: OT General Charges $OT Visit: 1 Visit OT Treatments $Self Care/Home Management : 38-52 mins  Lodema Hong, [place] 05/18/2021, 10:28 AM

## 2021-05-18 NOTE — Progress Notes (Signed)
Spoke with patient's son [name] over the phone.  Discussed that Mr. Kathrin Greathouse medical condition is stable but he is not capable of remembering day-to-day conversations nor managing his own medical care and will need to be in long-term care facility or in the presence of family that can regularly take care of him. I discussed the overall prognosis of Mr. Dingus condition in that currently he is able to converse, ambulate and eat but those capabilities will likely continue to decline and difficult decisions regarding his medical care will need to be made. I advised Mr. Whitfield that these decisions generally fall to a close family member. Mr. Durward Fortes stated he would be happy to converse with SW and they could call him should they need any specific information or decision making from him. He admitted that he still does not have a living situation that would fit his father as he is currently living with the mother's side of the family and it is not his space to make decisions on.

## 2021-05-19 DIAGNOSIS — F0391 Unspecified dementia with behavioral disturbance: Secondary | ICD-10-CM | POA: Diagnosis not present

## 2021-05-19 DIAGNOSIS — R4182 Altered mental status, unspecified: Secondary | ICD-10-CM | POA: Diagnosis not present

## 2021-05-19 LAB — GLUCOSE, CAPILLARY
Glucose-Capillary: 133 mg/dL — ABNORMAL HIGH (ref 70–99)
Glucose-Capillary: 118 mg/dL — ABNORMAL HIGH (ref 70–99)

## 2021-05-19 MED ORDER — POLYETHYLENE GLYCOL 3350 17 G PO PACK
17.0000 g | PACK | Freq: Every day | ORAL | Status: DC
  Administered 2021-05-19 – 2021-09-24 (×93): 17 g via ORAL
  Filled 2021-05-19 (×118): qty 1

## 2021-05-19 MED ORDER — OXYCODONE HCL 5 MG PO TABS
2.5000 mg | ORAL_TABLET | Freq: Once | ORAL | Status: AC
  Administered 2021-05-19: 2.5 mg via ORAL
  Filled 2021-05-19: qty 1

## 2021-05-19 MED ORDER — MUPIROCIN 2 % EX OINT
TOPICAL_OINTMENT | Freq: Two times a day (BID) | CUTANEOUS | Status: DC
  Administered 2021-05-22 – 2021-06-10 (×4): 1 via TOPICAL
  Filled 2021-05-19 (×2): qty 22
  Filled 2021-05-19: qty 44
  Filled 2021-05-19 (×2): qty 22
  Filled 2021-05-19: qty 44
  Filled 2021-05-19 (×3): qty 22

## 2021-05-19 MED ORDER — SENNA 8.6 MG PO TABS
1.0000 | ORAL_TABLET | Freq: Two times a day (BID) | ORAL | Status: DC
  Administered 2021-05-19 – 2021-08-20 (×180): 8.6 mg via ORAL
  Filled 2021-05-19 (×185): qty 1

## 2021-05-19 NOTE — Progress Notes (Signed)
FPTS Brief Progress Note  S Saw patient at bedside this evening. Patient was sleeping comfortably. I did not wake the patient.    O: BP (!) 111/56 (BP Location: Left Arm)   Pulse 89   Temp 98.7 F (37.1 C) (Oral)   Resp 18   Ht '5\' 8"'$  (1.727 m)   Wt 104 kg   SpO2 99%   BMI 34.86 kg/m    General: Sleeping comfortably in bed Resp: Good chest rise and fall  A/P: Plan per day team  Delirium precautions - Orders reviewed. Labs for AM not ordered, which was adjusted as needed.   Orvis Brill, DO 05/19/2021, 7:56 PM PGY-1, [facility] Night Resident  Please page [phone] with questions.

## 2021-05-19 NOTE — Progress Notes (Signed)
Occupational Therapy Treatment Patient Details Name: Charles Fox MRN: [id number] DOB: [birth year] Today's Date: 05/19/2021    History of present illness 70 yr old male who presents with AMS and hematuria/urinary retention (foley placed 8/15), diagnosis of UTI. PMH includes diabetes, CVA  hypertension, hyperlipidemia, homelessness, tobacco use disorder.   OT comments  Patient seen by skilled OT to address toileting, hygiene, and safety.  Patient was impulsive during treatment requiring frequent cues for safety and to use RW with mobility and transfers.  Acute OT to continue to follow.  Follow Up Recommendations  SNF;Supervision/Assistance - 24 hour    Equipment Recommendations       Recommendations for Other Services      Precautions / Restrictions Precautions Precautions: Fall Precaution Comments: foley cath       Mobility Bed Mobility Overal bed mobility: Modified Independent Bed Mobility: Sit to Supine       Sit to supine: Modified independent (Device/Increase time);HOB elevated        Transfers Overall transfer level: Needs assistance Equipment used: Rolling walker (2 wheeled) Transfers: Sit to/from Stand Sit to Stand: Supervision Stand pivot transfers: Supervision       General transfer comment: Cues to stay with walker    Balance Overall balance assessment: Needs assistance Sitting-balance support: No upper extremity supported;Feet supported Sitting balance-Leahy Scale: Good     Standing balance support: No upper extremity supported Standing balance-Leahy Scale: Fair Standing balance comment: able to stand unsupported for toilet hygiene and hand hygiene                           ADL either performed or assessed with clinical judgement   ADL Overall ADL's : Needs assistance/impaired     Grooming: Wash/dry hands;Supervision/safety;Standing Grooming Details (indicate cue type and reason): Supervision standing due to impulsive                  Toilet Transfer: Supervision/safety;Regular Toilet;RW;Ambulation Armed forces technical officer Details (indicate cue type and reason): frequent cues for safety Toileting- Clothing Manipulation and Hygiene: Sit to/from stand;Supervision/safety Toileting - Clothing Manipulation Details (indicate cue type and reason): Able to perform hygiene standing     Functional mobility during ADLs: Supervision/safety;Rolling walker General ADL Comments: vcs to use walker     Vision       Perception     Praxis      Cognition Arousal/Alertness: Awake/alert Behavior During Therapy: Impulsive Overall Cognitive Status: Impaired/Different from baseline Area of Impairment: Safety/judgement;Memory;Attention                 Orientation Level: Person;Place Current Attention Level: Selective Memory: Decreased short-term memory   Safety/Judgement: Decreased awareness of safety;Decreased awareness of deficits Awareness: Intellectual Problem Solving: Requires verbal cues General Comments: "Do you think I could leave today?"        Exercises     Shoulder Instructions       General Comments      Pertinent Vitals/ Pain       Pain Assessment: 0-10 Pain Score: 2  Faces Pain Scale: Hurts a little bit Pain Location: "my back" Pain Descriptors / Indicators: Aching Pain Intervention(s): Repositioned  Home Living                                          Prior Functioning/Environment  Frequency  Min 2X/week        Progress Toward Goals  OT Goals(current goals can now be found in the care plan section)  Progress towards OT goals: Progressing toward goals  Acute Rehab OT Goals Patient Stated Goal: to go home OT Goal Formulation: With patient Time For Goal Achievement: 05/24/21 Potential to Achieve Goals: Fair ADL Goals Pt Will Perform Grooming: with modified independence;standing Pt Will Perform Upper Body Bathing: with modified  independence;sitting Pt Will Perform Lower Body Bathing: with modified independence;sit to/from stand Pt Will Perform Upper Body Dressing: with modified independence;sitting Pt Will Perform Lower Body Dressing: with modified independence;sit to/from stand Pt Will Transfer to Toilet: with modified independence;ambulating Pt Will Perform Toileting - Clothing Manipulation and hygiene: with modified independence;sit to/from stand  Plan Discharge plan remains appropriate    Co-evaluation                 AM-PAC OT "6 Clicks" Daily Activity     Outcome Measure   Help from another person eating meals?: None Help from another person taking care of personal grooming?: A Little Help from another person toileting, which includes using toliet, bedpan, or urinal?: A Little Help from another person bathing (including washing, rinsing, drying)?: A Little Help from another person to put on and taking off regular upper body clothing?: A Little Help from another person to put on and taking off regular lower body clothing?: A Little 6 Click Score: 19    End of Session Equipment Utilized During Treatment: Gait belt;Rolling walker  OT Visit Diagnosis: Unsteadiness on feet (R26.81)   Activity Tolerance Patient tolerated treatment well   Patient Left with call bell/phone within reach   Nurse Communication Mobility status        Time: [id number] OT Time Calculation (min): 22 min  Charges: OT Treatments $Self Care/Home Management : 8-22 mins  Lodema Hong, OTA    Elaisha Zahniser [name] 05/19/2021, 9:10 AM

## 2021-05-19 NOTE — Progress Notes (Signed)
FPTS Interim Progress Note  S: Received page from nurse stating patient was having gross hematuria and penile pain.  Went to evaluate patient who stated he is having pain at the tip of his penis when the catheter tubing contacts.  He does not have pain when he is just sitting or laying down.  Denies abdominal pain.  He is also having difficulty having a bowel movement.  States he is straining but cannot make it happen.  O: BP 131/72 (BP Location: Left Arm)   Pulse 89   Temp 98.7 F (37.1 C) (Oral)   Resp 18   Ht '5\' 8"'$  (1.727 m)   Wt 104 kg   SpO2 98%   BMI 34.86 kg/m   General: Awake, alert, uncomfortable appearing Abdomen: Soft, nontender abdomen and suprapubic area, normoactive bowel sounds Genitalia: 8 mm erythematous, yellow lesion without frank discharge consistent with superficial infection, red urine and Foley catheter bag not appearing on tubing or penile exam    A/P: At this time I believe the infection to be superficial and not representative of UTI or cystitis.  Blood in Foley catheter likely due to same cause as when patient previously presented.  We will still need urology follow-up outpatient.  Will monitor hematuria and pain.  Started MiraLAX daily and senna by daily for constipation Hold aspirin Topical mupirocin 2 times daily  [name], DO 05/19/2021, 12:13 PM PGY-1, [facility] Service pager [phone]

## 2021-05-20 DIAGNOSIS — R4182 Altered mental status, unspecified: Secondary | ICD-10-CM | POA: Diagnosis not present

## 2021-05-20 DIAGNOSIS — R319 Hematuria, unspecified: Secondary | ICD-10-CM

## 2021-05-20 NOTE — Progress Notes (Signed)
Clergy, Arlean Hopping, wanted to leave his phone number, [phone]. Mr. Saverio Danker states that he can be contacted for any needs. Patient also given Mr. [place] phone number.

## 2021-05-20 NOTE — Progress Notes (Signed)
Family Medicine Teaching Service Daily Progress Note Intern Pager: [phone]  Patient name: Charles Fox Medical record number: [id number] Date of birth: 09-06-1951 Age: 70 y.o. Gender: male  Primary Care Provider: [name], MD Consultants: Psychiatry (s/o) Code Status: Full  [name]:  8/19: admitted for AMS, hematuria 8/20: improved mentation, continued hematuria, normal brain MRI 8/21: [place] [facility] 12/30, determined not to have capacity 8/22: discussion with son revealed pt does not have safe place for discharge regarding family 8/23: PSA elevated. Urology recommend keeping catheter and rechecking PSA in 1 month 8/24: voiding trial 8/25: suicide precautions, voiding trial failed, foley reinserted 8/26: suicide precautions removed 8/29: agitated and combative, delirium precautions placed  Assessment and Plan:  Charles Fox is a 70 year old male admitted for altered mental status with unremarkable head imaging.  He is medically stable for placement which has been complicated by history of homelessness, a foley catheter, and absence of Medicaid  AMS 2/2 worsening dementia  housing insecurity/homelessness  agitation Currently medically stable and awaiting placement. On metal status exam, patient unaware of why they are in the hospital or why they have a foley catheter. He continues to lack decision making capacity.   - Continue Seroquel 50 mg nightly - Neurology referral outpatient for dementia evaluation - TOC assistance with placement, appreciate efforts - Continue PT as appropriate   Urinary retention Foley catheter in place, to be left in for one month, placed 8/31 -Continue Flomax 0.4 mg daily -Urology follow-up outpatient   T2DM CBGs have been stable, most recently  118 8/31 -Continue metformin 500 mg BID -Discontinue daily CBG checks   FEN/GI: Heart healthy/carb modified PPx: Lovenox Dispo: Long-term care facility  today. Barriers  include Placement, medically stable for discharge.   Subjective:  No events overnight, eating breakfast this morning. Conversant good affect  Objective: Temp:  [98 F (36.7 C)-98.7 F (37.1 C)] 98.5 F (36.9 C) (09/01 0753) Pulse Rate:  [85-92] 86 (09/01 0753) Resp:  [18-20] 20 (09/01 0753) BP: (111-137)/(56-119) 137/76 (09/01 0753) SpO2:  [98 %-100 %] 98 % (09/01 0753) Physical Exam: General: Well appearing Cardiovascular: RRR, [place] Respiratory: CTABL, no wheezes or stridor Abdomen: Soft, non-tender, non-distended Extremities: UE pulses intact GU: Foley catheter in place with small lesion on glans of penis, near urethra, stable in appearance from photo in chart. Neuro: Alert and oriented to person and place, unable to state why in hospital, unable to state why has catheter, endorses homelesness  Laboratory: Recent Labs  Lab 05/17/21 0203  WBC 10.2  HGB 14.8  HCT 44.5  PLT 356   Recent Labs  Lab 05/14/21 0206 05/17/21 0203  NA 133* 134*  K 4.1 4.0  CL 102 104  CO2 24 19*  BUN 15 13  CREATININE 1.10 0.92  CALCIUM 8.8* 8.6*  GLUCOSE 110* 108*     Imaging/Diagnostic Tests: No new imaging  Charles Bouche, MD 05/20/2021, 8:11 AM PGY-1, [facility] Intern pager: [phone], text pages welcome

## 2021-05-20 NOTE — Progress Notes (Signed)
FPTS Interim Progress Note  S: No concerns or complaints tonight other than the patient is hopeful he will be able to get out of the hospital soon.  O: BP 105/79 (BP Location: Left Arm)   Pulse 85   Temp 97.8 F (36.6 C) (Oral)   Resp 18   Ht '5\' 8"'$  (1.727 m)   Wt 104 kg   SpO2 97%   BMI 34.86 kg/m   General: Alert and oriented in no apparent distress, patient is alert and oriented to self, place, and time which is an improvement from my last encounter with him about a week ago. Heart: Regular rate and rhythm with no murmurs appreciated Lungs: CTA bilaterally, no wheezing  A/P: 70 year old male currently awaiting placement.  Patient has no concerns or complaints tonight.  He currently is alert and oriented to person, place, and year which is an improvement from when I saw him last Saturday.  For additional details see daytime note.   Lurline Del, DO 05/20/2021, 9:30 PM PGY-3, [facility] Service pager [phone]

## 2021-05-20 NOTE — TOC Progression Note (Signed)
Transition of Care [facility]) - Progression Note    Patient Details  Name: Charles Fox MRN: [id number] Date of Birth: [birth year]  [facility] CM/SW Luis Lopez, LCSW Phone Number: 05/20/2021, 11:36 AM  Clinical Narrative:    Supervisor aware of case but not yet on DTP list as there is no availability at this time. CSW sent follow up email to financial counseling to ask about eligibility for Medicaid/Disability.    Expected Discharge Plan: [place] Barriers to Discharge: Financial Resources, Homeless with medical needs, Unsafe home situation  Expected Discharge Plan and Services Expected Discharge Plan: [place] arrangements for the past 2 months: Homeless                                       Social Determinants of Health (SDOH) Interventions    Readmission Risk Interventions No flowsheet data found.

## 2021-05-20 NOTE — Plan of Care (Signed)
Pt alert with episodes of confusion during am shift, no telemetry or piv in use, pt ambulated to shower and had bm during OT consult, pt reported pain to pubic area, indwelling foley with amber cloudy urine, skin intact, resp even and unlabored, bed in lowest and locked position, sbar to be provided to on coming nurse.   Problem: Education: Goal: Knowledge of General Education information will improve Description: Including pain rating scale, medication(s)/side effects and non-pharmacologic comfort measures Outcome: Progressing   Problem: Clinical Measurements: Goal: Ability to maintain clinical measurements within normal limits will improve Outcome: Progressing Goal: Will remain free from infection Outcome: Progressing   Problem: Activity: Goal: Risk for activity intolerance will decrease Outcome: Progressing   Problem: Nutrition: Goal: Adequate nutrition will be maintained Outcome: Progressing   Problem: Elimination: Goal: Will not experience complications related to bowel motility Outcome: Progressing   Problem: Pain Managment: Goal: General experience of comfort will improve Outcome: Progressing   Problem: Safety: Goal: Ability to remain free from injury will improve Outcome: Progressing

## 2021-05-21 DIAGNOSIS — R4182 Altered mental status, unspecified: Secondary | ICD-10-CM | POA: Diagnosis not present

## 2021-05-21 DIAGNOSIS — R319 Hematuria, unspecified: Secondary | ICD-10-CM | POA: Diagnosis not present

## 2021-05-21 LAB — GLUCOSE, CAPILLARY
Glucose-Capillary: 112 mg/dL — ABNORMAL HIGH (ref 70–99)
Glucose-Capillary: 150 mg/dL — ABNORMAL HIGH (ref 70–99)

## 2021-05-21 NOTE — TOC Progression Note (Signed)
Transition of Care [facility]) - Progression Note    Patient Details  Name: Charles Fox MRN: [id number] Date of Birth: [birth year]  Transition of Care [facility]) CM/SW [place], LCSW Phone Number: 05/21/2021, 9:58 AM  Clinical Narrative:    Per financial counseling, they are trying to contact patient's family to confirm his financial situation to determine if he can qualify for Medicaid. Per [facility] leadership, patient does not qualify for an Letter of Guarantee and would have to see if any SNF would be willing to accept him based off of a need for foley (which is not likely to be approved by Medicare unfortunately). Referral has been faxed out to memory care facilities in the event any of them would be willing to accept patient pending Medicaid.    Expected Discharge Plan: [place] Barriers to Discharge: Financial Resources, Homeless with medical needs, Unsafe home situation  Expected Discharge Plan and Services Expected Discharge Plan: [place] arrangements for the past 2 months: Homeless                                       Social Determinants of Health (SDOH) Interventions    Readmission Risk Interventions No flowsheet data found.

## 2021-05-21 NOTE — Consult Note (Signed)
   [facility] Inpatient Consult   05/21/2021  Charles Fox 10/23/1950 [id number]  [place] [facility] [ACO] Patient: [employer] Medicare  Primary Case Provider:  [name], MD, [place] is an Embedded provider practice that has a Chronic Care Management team and program.   Patient was screened for  length of stay. Patient will have the transition of care call conducted by the primary care provider. This patient is also in an Embedded practice which has a chronic disease management Embedded Care Management team. Review reveals patient is for a skilled nursing level of care for disposition.  Plan: For care management the patient could be referred to the Embedded CCM team, if appropriate and if patient meets criteria for their CCM program.  Please contact for further questions,  [name], RN BSN [facility] Liaison  [phone] business mobile phone Toll free office [phone]  Fax number: [phone] [email or web address]

## 2021-05-21 NOTE — Progress Notes (Signed)
Physical Therapy Treatment Patient Details Name: WANYA BELMONT MRN: [id number] DOB: 1951-09-07 Today's Date: 05/21/2021    History of Present Illness 70 yr old male who presents with AMS and hematuria/urinary retention on 05/07/21 (foley placed 8/15), diagnosis of UTI. PMH includes diabetes, CVA  hypertension, hyperlipidemia, homelessness, tobacco use disorder.    PT Comments    Pt limited today due to c/o dizziness.  He reports he tried to get up earlier and got dizzy.  States he still feels off but dizziness improved.  Convinced pt to at least sit EOB today so that PT could assess dizziness and BP.  Pt did have a 25 point drop in systolic BP with sitting and reports dizzy, BP improving after 3 mins.  He refused to stand to further assess.  Notified RN of orthostatic BP.  Continued POC and prior recommendations from therapy as treatment today was limited.    Follow Up Recommendations  Home health PT (At ALF or Group home - pt refusing shelter)     Equipment Recommendations  Other (comment) (rollator)    Recommendations for Other Services       Precautions / Restrictions Precautions Precautions: Fall Precaution Comments: foley cath Restrictions Weight Bearing Restrictions: No    Mobility  Bed Mobility Overal bed mobility: Needs Assistance Bed Mobility: Supine to Sit;Sit to Supine     Supine to sit: Min assist Sit to supine: Min guard   General bed mobility comments: Min A to EOB and min guard back to bed.  Increased assist today due to dizziness. See general comments    Transfers                 General transfer comment: refused due to dizziness  Ambulation/Gait                 Stairs             Wheelchair Mobility    Modified Rankin (Stroke Patients Only)       Balance Overall balance assessment: Needs assistance Sitting-balance support: Bilateral upper extremity supported;Feet unsupported Sitting balance-Leahy Scale: Poor Sitting  balance - Comments: requiring UE support today and min guard due to dizziness                                    Cognition Arousal/Alertness: Awake/alert Behavior During Therapy: Anxious Overall Cognitive Status: Impaired/Different from baseline Area of Impairment: Safety/judgement;Memory;Attention;Orientation;Awareness                 Orientation Level: Disoriented to;Time;Situation Current Attention Level: Selective Memory: Decreased short-term memory   Safety/Judgement: Decreased awareness of safety;Decreased awareness of deficits Awareness: Emergent Problem Solving: Slow processing;Difficulty sequencing General Comments: Pt's verbalizations/thoughts scattered - has difficulty completing topics requiring questions/cues to finish thoughts      Exercises      General Comments   Pt reports he got dizzy when he tried to get up earlier today.  Described as spinning but when therapist asked if he felt like he would pass out he says yes.  Pt initially wanting to just stay in bed and not attempt any activity.  Educated and convinced pt that we need to assess his dizziness and BP with position change , so that we can properly treat.   Orthostatic BPs  Supine 138/68  Sitting 113/62, reports room spinning  Sitting after 3 min 129/73, reports symptoms worse  Standing Pt refused  Return to supine 132/78 symptoms resolving   Did also assess EOEM, tracking, gaze stabilization, and head turns all of which were normal and did not increase dizziness.      Pertinent Vitals/Pain Pain Assessment: No/denies pain    Home Living Family/patient expects to be discharged to:: Assisted living                    Prior Function            PT Goals (current goals can now be found in the care plan section) Progress towards PT goals: Not progressing toward goals - comment (limited due to dizziness today)    Frequency    Min 3X/week      PT Plan Current plan  remains appropriate    Co-evaluation              AM-PAC PT "6 Clicks" Mobility   Outcome Measure  Help needed turning from your back to your side while in a flat bed without using bedrails?: A Little Help needed moving from lying on your back to sitting on the side of a flat bed without using bedrails?: A Little Help needed moving to and from a bed to a chair (including a wheelchair)?: A Little Help needed standing up from a chair using your arms (e.g., wheelchair or bedside chair)?: A Little Help needed to walk in hospital room?: A Little Help needed climbing 3-5 steps with a railing? : A Little 6 Click Score: 18    End of Session   Activity Tolerance: Other (comment) (limited due to dizziness) Patient left: in bed;with call bell/phone within reach;with bed alarm set Nurse Communication: Mobility status (bp) PT Visit Diagnosis: Unsteadiness on feet (R26.81);Other abnormalities of gait and mobility (R26.89);Difficulty in walking, not elsewhere classified (R26.2)     Time: [id number] PT Time Calculation (min) (ACUTE ONLY): 20 min  Charges:  $Therapeutic Activity: 8-22 mins                     Abran Richard, PT Acute Rehab Services Pager [phone] [facility] [place] 05/21/2021, 2:51 PM

## 2021-05-21 NOTE — Progress Notes (Addendum)
Family Medicine Teaching Service Daily Progress Note Intern Pager: [phone]  Patient name: Charles Fox Medical record number: [id number] Date of birth: 1951/08/11 Age: 70 y.o. Gender: male  Primary Care Provider: [name], MD Consultants: Psych (s/o) Code Status: Full  [name]:  8/19: Admitted to [facility] for altered mental status and hematuria 8/20: Patient has had continued hematuria with normal brain MRI and improved mentation 8/21: MoCA 12/30, determined to not have capacity 8/22: Discussion with son revealed patient does not have a safe place for discharge regarding family 8/23: PSA elevated.  Neurology recommended keeping catheter and rechecking PSA in 1 month 8/24: Voiding trial 8/25: Suicide precautions, trial failed, Foley reinserted 8/26: Suicide precautions removed  8/29: Agitated and combative, delirium precautions placed    Assessment and Plan:  Charles Fox is a 70 year old male admitted for altered mental status with unremarkable head imaging.  He is medically stable for placement which has been complicated by history of facing housing insecurity, Foley catheter, and absence of Medicaid  AMS secondary to worsening dementia housing insecurity/homelessness agitation The patient is medically stable and awaiting placement.  On mental status exam he is alert and oriented but still does not have capacity.  MoCA performed on 8/21 was 12 out of 30.  -Continue Seroquel 50 mg nightly as needed for agitation - Neurology referral outpatient for dementia evaluation - TOC assistance with placement, appreciate efforts - Continue with PT as appropriate  Urinary retention Foley catheter in place.  Urology has made the recommendation for it to be in for 1 month and it was placed again on 8/31.  -Continue with Flomax 0.4 mg daily -Urology follow-up outpatient  -Updated UA today to evaluate if we can restart his ASA for secondary prevention given  CVA history   Penile Abrasion Received a page that the patient was having severe penile pain and was not making urine 2 days ago.  His Foley at the time was taken out and replaced with removal of 675 mL of urine.  Once the Foley was replaced the pain dissipated.  However on physical exam when this occurred the patient had a penile abrasion.  Likely that the patient attempted to pull the Foley out. - Emphasized to the patient the need for Foley and to try not to touch with the Foley - Penile abrasion seems to be improving on exam today - Continue to monitor  DM2 CBGs were stable since admission  -Home medication: Metformin 500 mg BID -We have d/c daily CBG checks   FEN/GI: Heart healthy/Carb modified  PPx: Lovenox  Dispo: Long term care facility.   Pt is medically stable for discharge at this time but is facing housing insecurity.   Subjective:  Pt is doing well today and is in a good mood.  He denies any complications at this time.  Denies any penile pain.  No shortness of breath or chest pain.  Objective: Temp:  [97.8 F (36.6 C)-98.5 F (36.9 C)] 98.5 F (36.9 C) (09/02 0811) Pulse Rate:  [80-93] 80 (09/02 0811) Resp:  [16-20] 17 (09/02 0811) BP: (102-128)/(68-79) 128/69 (09/02 0811) SpO2:  [97 %-99 %] 98 % (09/02 [id number]) Physical Exam Vitals reviewed.  Constitutional:      Appearance: Normal appearance.     Comments: AxO to person, place today  Abdominal:     General: Bowel sounds are normal. There is no distension.     Palpations: Abdomen is soft.     Tenderness:  There is no abdominal tenderness.  Genitourinary:    Comments: Healing penile abrasion near the urethral opening.  No evidence of discharge or bleeding. Improved from original photo in chart. Foley cath in place.  Skin:    General: Skin is warm and dry.  Neurological:     Mental Status: He is alert.  Psychiatric:        Mood and Affect: Mood normal.        Behavior: Behavior normal.     Laboratory: Recent  Labs  Lab 05/17/21 0203  WBC 10.2  HGB 14.8  HCT 44.5  PLT 356   Recent Labs  Lab 05/17/21 0203  NA 134*  K 4.0  CL 104  CO2 19*  BUN 13  CREATININE 0.92  CALCIUM 8.6*  GLUCOSE 108Erskine Emery, MD 05/21/2021, 11:30 AM PGY-1, [place] Intern pager: [phone], text pages welcome

## 2021-05-21 NOTE — NC FL2 (Signed)
[place] LEVEL OF CARE SCREENING TOOL     IDENTIFICATION  Patient Name: Charles Fox Birthdate: [birth year] Sex: male Admission Date (Current Location): 05/07/2021  [facility] and [place] Number:  Herbalist and Address:  [facility] [place], [place] [address], [place], [place] [address]      Provider Number: [id number]  Attending Physician Name and Address:  Lenoria Chime, MD  Relative Name and Phone Number:       Current Level of Care: Hospital Recommended Level of Care: [place] Prior Approval Number:    Date Approved/Denied:   PASRR Number: [id number]  Discharge Plan: SNF    Current Diagnoses: Patient Active Problem List   Diagnosis Date Noted   Hematuria    Suicidal ideation    AMS (altered mental status) 05/09/2021   Altered mental status 05/07/2021   Chest pain 02/26/2021   Acute right-sided thoracic back pain 11/27/2020   History of lacunar cerebrovascular accident 06/11/2020   Dementia without behavioral disturbance ([place]) 05/27/2020   Impaired vision 05/27/2020   Pain, dental 11/20/2019   High priority for COVID-19 virus vaccination 11/20/2019   Chronic pain of both shoulders 09/10/2019   Tendinopathy of left rotator cuff 04/29/2019   Food insecurity 07/09/2018   Skin lesions, generalized 05/09/2018   Hyperlipidemia associated with type 2 diabetes mellitus ([place]) 12/22/2016   Urine test positive for microalbuminuria 05/27/2016   Type 2 diabetes mellitus with neurological complications ([place]) [id number]   Morbid obesity ([place]) 09/04/2014   Hypertension associated with diabetes ([place]) 09/04/2014   Tobacco use disorder 09/04/2014    Orientation RESPIRATION BLADDER Height & Weight     Self, Situation, Place  Normal Continent, Indwelling catheter Weight: 229 lb 4.5 oz (104 kg) Height:  '5\' 8"'$  (172.7 cm)  BEHAVIORAL SYMPTOMS/MOOD NEUROLOGICAL BOWEL NUTRITION STATUS   (Dementia)   Continent Diet (see  dc summary)  AMBULATORY STATUS COMMUNICATION OF NEEDS Skin   Supervision Verbally Normal                       Personal Care Assistance Level of Assistance  Bathing, Feeding, Dressing Bathing Assistance: Limited assistance Feeding assistance: Independent Dressing Assistance: Independent     Functional Limitations Info             SPECIAL CARE FACTORS FREQUENCY                       Contractures Contractures Info: Not present    Additional Factors Info  Code Status, Allergies, Psychotropic Code Status Info: Full Allergies Info: NKA Psychotropic Info: Seroquel         Current Medications (05/21/2021):  This is the current hospital active medication list Current Facility-Administered Medications  Medication Dose Route Frequency Provider Last Rate Last Admin   acetaminophen (TYLENOL) tablet 650 mg  650 mg Oral Q6H PRN Lattie Haw, MD       atorvastatin (LIPITOR) tablet 40 mg  40 mg Oral Daily Simmons-Robinson, Makiera, MD   40 mg at 05/21/21 0916   carvedilol (COREG) tablet 3.125 mg  3.125 mg Oral BID WC Simmons-Robinson, Makiera, MD   3.125 mg at 05/21/21 [id number]   Chlorhexidine Gluconate Cloth 2 % PADS 6 each  6 each Topical Daily Lind Covert, MD   6 each at 05/21/21 0923   enoxaparin (LOVENOX) injection 40 mg  40 mg Subcutaneous Q24H Espinoza, Alejandra, DO   40 mg at 05/20/21 1428  losartan (COZAAR) tablet 50 mg  50 mg Oral Daily Espinoza, Alejandra, DO   50 mg at 05/21/21 0917   metFORMIN (GLUCOPHAGE-XR) 24 hr tablet 500 mg  500 mg Oral BID WC Simmons-Robinson, Makiera, MD   500 mg at 05/21/21 0917   mupirocin ointment (BACTROBAN) 2 %   Topical BID Simmons-Robinson, Makiera, MD   Given at 05/21/21 0917   nicotine (NICODERM CQ - dosed in mg/24 hours) patch 14 mg  14 mg Transdermal Daily Dameron, Marisa, DO   14 mg at 05/21/21 0916   polyethylene glycol (MIRALAX / GLYCOLAX) packet 17 g  17 g Oral Daily [name], DO   17 g at 05/20/21 0906    QUEtiapine (SEROQUEL) tablet 50 mg  50 mg Oral Daily Martyn Malay, MD   50 mg at 05/20/21 2053   senna (SENOKOT) tablet 8.6 mg  1 tablet Oral BID [name], DO   8.6 mg at 05/21/21 [id number]   tamsulosin (FLOMAX) capsule 0.4 mg  0.4 mg Oral Daily Simmons-Robinson, Makiera, MD   0.4 mg at 05/21/21 [id number]     Discharge Medications: Please see discharge summary for a list of discharge medications.  Relevant Imaging Results:  Relevant Lab Results:   Additional Information SSN: [id number]. [employer] 12/02/19, 12/23/19  Benard Halsted, LCSW

## 2021-05-22 DIAGNOSIS — R4182 Altered mental status, unspecified: Secondary | ICD-10-CM | POA: Diagnosis not present

## 2021-05-22 DIAGNOSIS — R319 Hematuria, unspecified: Secondary | ICD-10-CM | POA: Diagnosis not present

## 2021-05-22 NOTE — Progress Notes (Signed)
Family Medicine Teaching Service Daily Progress Note Intern Pager: [phone]  Patient name: Charles Fox Medical record number: [id number] Date of birth: 04/05/1951 Age: 70 y.o. Gender: male  Primary Care Provider: [name], MD Consultants: Psych-signed off Code Status: Full code  [name]:  8/19: Admitted to [facility] for altered mental status and hematuria 8/20: Patient has had continued hematuria with normal brain MRI and improved mentation 8/21: MoCA 12/30, determined to not have capacity 8/22: Discussion with son revealed patient does not have a safe place for discharge regarding family 8/23: PSA elevated.  Neurology recommended keeping catheter and rechecking PSA in 1 month 8/24: Voiding trial 8/25: Suicide precautions, trial failed, Foley reinserted 8/26: Suicide precautions removed  8/29: Agitated and combative, delirium precautions placed     Assessment and Plan: 70 year old male admitted for altered mental status currently medically stable waiting for placement.  AMS secondary to worsening dementia  housing insecurity/homelessness  agitation Currently medically stable and waiting for placement.  No concerns or complaints at this time. -Continue Soquel 50 mg nightly as needed for agitation -Neurology referral outpatient for dementia evaluation -Continue PT as appropriate -Appreciate TOC assistance with placement  Urinary retention Foley in place, per urology can remove in 1 month. -Continue Flomax 0.4 mg daily -Urology follow-up outpatient  Penile abrasion Likely due to Foley, or the patient attempting to pull out Foley. -Continue to monitor  T2DM Continue metformin 500 twice daily  FEN/GI: Carb modified/heart healthy PPx: Lovenox Dispo: Awaiting placement for long-term care facility, patient medically stable for discharge but facing housing insecurities and difficulties with placement. Subjective:  Resting comfortably this  morning with no concerns or complaints.  Objective: Temp:  [98 F (36.7 C)-98.6 F (37 C)] 98 F (36.7 C) (09/03 0349) Pulse Rate:  [76-89] 84 (09/03 0349) Resp:  [16-19] 17 (09/03 0349) BP: (113-132)/(64-89) 132/77 (09/03 0349) SpO2:  [95 %-100 %] 95 % (09/03 0349) Physical Exam: General: No acute distress, overly male lying in bed Cardiovascular: S1, S2 present, no murmurs Respiratory: No shortness of breath or respiratory distress, clear to auscultation Abdomen: Bowel sounds present, no abdominal pain   Laboratory: Recent Labs  Lab 05/17/21 0203  WBC 10.2  HGB 14.8  HCT 44.5  PLT 356   Recent Labs  Lab 05/17/21 0203  NA 134*  K 4.0  CL 104  CO2 19*  BUN 13  CREATININE 0.92  CALCIUM 8.6*  GLUCOSE 108*    Lurline Del, DO 05/22/2021, 5:20 AM PGY-3, [place] Intern pager: [phone], text pages welcome

## 2021-05-22 NOTE — Progress Notes (Signed)
MD on call notified that the patient is very anxious and restless. I have requested something for alleviation.  Awaiting response.

## 2021-05-23 DIAGNOSIS — R4182 Altered mental status, unspecified: Secondary | ICD-10-CM | POA: Diagnosis not present

## 2021-05-23 DIAGNOSIS — R319 Hematuria, unspecified: Secondary | ICD-10-CM | POA: Diagnosis not present

## 2021-05-23 MED ORDER — NICOTINE POLACRILEX 2 MG MT GUM
2.0000 mg | CHEWING_GUM | OROMUCOSAL | Status: DC | PRN
  Filled 2021-05-23 (×3): qty 1

## 2021-05-23 MED ORDER — RAMELTEON 8 MG PO TABS
8.0000 mg | ORAL_TABLET | Freq: Every day | ORAL | Status: DC
  Administered 2021-05-23 – 2021-06-23 (×32): 8 mg via ORAL
  Filled 2021-05-23 (×33): qty 1

## 2021-05-23 MED ORDER — QUETIAPINE FUMARATE 100 MG PO TABS
100.0000 mg | ORAL_TABLET | ORAL | Status: DC
  Administered 2021-05-23 – 2021-06-17 (×26): 100 mg via ORAL
  Filled 2021-05-23 (×26): qty 1

## 2021-05-23 NOTE — Progress Notes (Signed)
FPTS Interim Night Progress Note  S:Patient sleeping comfortably.  Rounded with primary night RN.  No concerns voiced.  No orders required.    O: Today's Vitals   05/22/21 1630 05/22/21 2009 05/22/21 2148 05/22/21 2346  BP: 128/73 139/75  123/68  Pulse: 79 88  87  Resp: '16 17  15  '$ Temp: 98.3 F (36.8 C) 98.5 F (36.9 C)  98.3 F (36.8 C)  TempSrc: Oral Oral    SpO2: 99% 99%  93%  Weight:      Height:      PainSc:   0-No pain       A/P: Continue current management  Carollee Leitz MD PGY-3, [place] Medicine Service pager [phone]

## 2021-05-23 NOTE — Progress Notes (Signed)
Family Medicine Teaching Service Daily Progress Note Intern Pager: [phone]  Patient name: Charles Fox Medical record number: [id number] Date of birth: May 25, 1951 Age: 70 y.o. Gender: male  Primary Care Provider: [name], MD Consultants: Psychiatry (s/o) Code Status: Full  [name]:  8/19: Admitted to [place] for altered mental status and hematuria 8/20: Patient has had continued hematuria with normal brain MRI and improved mentation 8/21: MoCA 12/30, determined to not have capacity 8/22: Discussion with son revealed patient does not have a safe place for discharge regarding family 8/23: PSA elevated.  Neurology recommended keeping catheter and rechecking PSA in 1 month 8/24: Voiding trial 8/25: Suicide precautions, trial failed, Foley reinserted 8/26: Suicide precautions removed  8/29: Agitated and combative, delirium precautions placed   Assessment and Plan: 70 year old male admitted for altered mental status currently medically stable waiting for placement.   AMS 2/2 dementia  agitation Medically stable for discharge. - Appreciate TOC assistance with placement - Increase Seroquel from 50 to 100 mg nightly scheduled - Recommend neurology outpatient referral for dementia - PT as appropriate  Urinary retention - Recommend urology follow-up outpatient - Flomax 0.4 mg daily - Foley in place until outpatient urology removal in 1 month  T2DM -Metformin 500 mg twice daily   FEN/GI: Carb modified heart healthy PPx: Lovenox Dispo:SNF  pending placement .   Subjective:  Patient sleeping in bed comfortably, tech at bedside taking vitals.  He wakes up to voice, reports he has no complaints.  No pain.  Objective: Temp:  [97.6 F (36.4 C)-98.5 F (36.9 C)] 97.9 F (36.6 C) (09/04 0739) Pulse Rate:  [70-88] 70 (09/04 0739) Resp:  [15-18] 16 (09/04 0739) BP: (123-140)/(68-82) 128/73 (09/04 0739) SpO2:  [93 %-99 %] 94 % (09/04 0739) Physical  Exam: General: Sleepy, wakes to voice Cardiovascular: Regular rate and rhythm, no murmurs appreciated Respiratory: Clear to auscultation bilaterally Abdomen: Bowel sounds present Extremities: Hands warm to touch, moves all extremities spontaneously  Laboratory: Recent Labs  Lab 05/17/21 0203  WBC 10.2  HGB 14.8  HCT 44.5  PLT 356   Recent Labs  Lab 05/17/21 0203  NA 134*  K 4.0  CL 104  CO2 19*  BUN 13  CREATININE 0.92  CALCIUM 8.6*  GLUCOSE 108*    Imaging/Diagnostic Tests: None last 24 hours.   Ezequiel Essex, MD 05/23/2021, 8:01 AM PGY-2, [place] Intern pager: [phone], text pages welcome

## 2021-05-24 DIAGNOSIS — R319 Hematuria, unspecified: Secondary | ICD-10-CM | POA: Diagnosis not present

## 2021-05-24 DIAGNOSIS — R4182 Altered mental status, unspecified: Secondary | ICD-10-CM | POA: Diagnosis not present

## 2021-05-24 LAB — BASIC METABOLIC PANEL
Anion gap: 9 (ref 5–15)
BUN: 20 mg/dL (ref 8–23)
CO2: 22 mmol/L (ref 22–32)
Calcium: 8.9 mg/dL (ref 8.9–10.3)
Chloride: 104 mmol/L (ref 98–111)
Creatinine, Ser: 1.38 mg/dL — ABNORMAL HIGH (ref 0.61–1.24)
GFR, Estimated: 56 mL/min — ABNORMAL LOW (ref >60)
Glucose, Bld: 84 mg/dL (ref 70–99)
Potassium: 4.6 mmol/L (ref 3.5–5.1)
Sodium: 135 mmol/L (ref 135–145)

## 2021-05-24 LAB — CBC
HCT: 43.9 % (ref 39.0–52.0)
Hemoglobin: 14.3 g/dL (ref 13.0–17.0)
MCH: 29.1 pg (ref 26.0–34.0)
MCHC: 32.6 g/dL (ref 30.0–36.0)
MCV: 89.4 fL (ref 80.0–100.0)
Platelets: 339 10*3/uL (ref 150–400)
RBC: 4.91 MIL/uL (ref 4.22–5.81)
RDW: 13.7 % (ref 11.5–15.5)
WBC: 8.9 10*3/uL (ref 4.0–10.5)
nRBC: 0 % (ref 0.0–0.2)

## 2021-05-24 NOTE — Progress Notes (Signed)
FPTS Interim Progress Note  S: No complaints or concerns.  O: BP 120/68 (BP Location: Left Arm)   Pulse 89   Temp 98.5 F (36.9 C) (Oral)   Resp 18   Ht '5\' 8"'$  (1.727 m)   Wt 104 kg   SpO2 95%   BMI 34.86 kg/m   General: Alert, no apparent distress Heart: Regular rate and rhythm with no murmurs appreciated Lungs: CTA bilaterally, no wheezing Skin: Warm and dry  A/P: No concerns or complaints at this time.  Continues to remain medically stable for discharge upon appropriate placement.  Remaining per day team note  Lurline Del, DO 05/24/2021, 12:07 AM PGY-3, [place] Medicine Service pager [phone]

## 2021-05-24 NOTE — TOC Progression Note (Signed)
Transition of Care [facility]) - Progression Note    Patient Details  Name: Charles Fox MRN: [id number] Date of Birth: 1950-11-29  Transition of Care [facility] CM/SW [place], RN Phone Number: 05/24/2021, 10:19 AM  Clinical Narrative:    Case management spoke with Percell Locus, MSW with [facility] and received transitions of care report and needs regarding this patient.  The patient was placed on the DTP Team for transitions of care needs and discharge planning.    CM called the patient's son, [name] on the phone but I was unable to speak with him regarding patient's financial and social situation.  I left a message on [place]'s phone requesting a conversation with him and to assist with obtaining other family contact numbers - including the patient's two other children.  The patient has previous SW notes from the [facility] office stating the patient has two daughters as well as a son.  I would like to find a reliable family member to assist patient for ALF/Group home placement if I am able to find a facility for care.  The patient has a closed APS case recently involving Cristy Friedlander, MSW with DSS - [phone].  I will call DSS tomorrow and speak with DSS to obtain other family member names and numbers for contact.  The patient will need a reliable family member since the patient does not have capacity for medical decision making per physician note.  CM called and spoke with [name], CM with [facility] and clinicals were emailed to the facility to review - [email or web address].  I'm not sure if the ALF will be able to assist with patient's foley needs - but I explore home health services if I am able to find an accepting ALF.  CM and MSW with DTP Team will continue to follow the patient for ALF/ Group Home placement.  Expected Discharge Plan: Assisted Living Barriers to Discharge: Homeless with medical needs, Family Issues, Unsafe home situation  Expected  Discharge Plan and Services Expected Discharge Plan: Assisted Living   Discharge Planning Services: CM Consult   Living arrangements for the past 2 months: Homeless (Patient was living in his truck prior to hospitalization.)                                       Social Determinants of Health (SDOH) Interventions    Readmission Risk Interventions No flowsheet data found.

## 2021-05-24 NOTE — Progress Notes (Signed)
Occupational Therapy Treatment Patient Details Name: Charles Fox MRN: [id number] DOB: September 11, 1951 Today's Date: 05/24/2021    History of present illness 70 yr old male who presents with AMS and hematuria/urinary retention on 05/07/21 (foley placed 8/15), diagnosis of UTI. PMH includes diabetes, CVA  hypertension, hyperlipidemia, homelessness, tobacco use disorder.   OT comments  Patient required vcs to participate in grooming standing at sink with supervision for safety.  Patient was preoccupied with conversation that he believes his wife is cheating on him and/or has left him and took his children. OTA redirected patient on his hospital stay.  Acute OT to continue to follow.   Follow Up Recommendations  SNF;Supervision/Assistance - 24 hour    Equipment Recommendations       Recommendations for Other Services      Precautions / Restrictions Precautions Precautions: Fall Precaution Comments: foley cath Restrictions Weight Bearing Restrictions: No       Mobility Bed Mobility Overal bed mobility: Needs Assistance Bed Mobility: Supine to Sit;Sit to Supine     Supine to sit: Supervision Sit to supine: Supervision   General bed mobility comments: vcs for rail use and attention to catheter    Transfers Overall transfer level: Needs assistance Equipment used: Rolling walker (2 wheeled) Transfers: Sit to/from Stand Sit to Stand: Supervision Stand pivot transfers: Supervision       General transfer comment: supervsion due catheter and unsafe    Balance Overall balance assessment: Needs assistance Sitting-balance support: No upper extremity supported Sitting balance-Leahy Scale: Fair     Standing balance support: No upper extremity supported;During functional activity Standing balance-Leahy Scale: Fair Standing balance comment: unsafe standing balance due to impulsive and poor safety                           ADL either performed or assessed with  clinical judgement   ADL Overall ADL's : Needs assistance/impaired     Grooming: Wash/dry hands;Wash/dry face;Applying deodorant;Standing;Supervision/safety Grooming Details (indicate cue type and reason): Grooming performed standing at sink with supervision due to safety                             Functional mobility during ADLs: Supervision/safety;Rolling walker General ADL Comments: Does not want to use rw     Vision       Perception     Praxis      Cognition Arousal/Alertness: Awake/alert Behavior During Therapy: Impulsive Overall Cognitive Status: Impaired/Different from baseline Area of Impairment: Orientation;Memory;Safety/judgement;Problem solving                 Orientation Level: Time Current Attention Level: Selective Memory: Decreased short-term memory   Safety/Judgement: Decreased awareness of safety;Decreased awareness of deficits Awareness: Emergent Problem Solving: Slow processing;Difficulty sequencing General Comments: Patient thought he was at home, believes his wife is cheating on him.        Exercises     Shoulder Instructions       General Comments      Pertinent Vitals/ Pain       Pain Assessment: 0-10 Pain Score: 2  Faces Pain Scale: Hurts a little bit Pain Location: "my back" Pain Descriptors / Indicators: Grimacing;Aching Pain Intervention(s): Monitored during session  Home Living  Prior Functioning/Environment              Frequency  Min 2X/week        Progress Toward Goals  OT Goals(current goals can now be found in the care plan section)  Progress towards OT goals: Progressing toward goals  Acute Rehab OT Goals Patient Stated Goal: to go home OT Goal Formulation: With patient Time For Goal Achievement: 05/24/21 Potential to Achieve Goals: Fair ADL Goals Pt Will Perform Grooming: with modified independence;standing Pt Will Perform  Upper Body Bathing: with modified independence;sitting Pt Will Perform Lower Body Bathing: with modified independence;sit to/from stand Pt Will Perform Upper Body Dressing: with modified independence;sitting Pt Will Perform Lower Body Dressing: with modified independence;sit to/from stand Pt Will Transfer to Toilet: with modified independence;ambulating Pt Will Perform Toileting - Clothing Manipulation and hygiene: with modified independence;sit to/from stand  Plan Discharge plan remains appropriate    Co-evaluation                 AM-PAC OT "6 Clicks" Daily Activity     Outcome Measure   Help from another person eating meals?: None Help from another person taking care of personal grooming?: A Little Help from another person toileting, which includes using toliet, bedpan, or urinal?: A Little Help from another person bathing (including washing, rinsing, drying)?: A Little Help from another person to put on and taking off regular upper body clothing?: A Little Help from another person to put on and taking off regular lower body clothing?: A Little 6 Click Score: 19    End of Session Equipment Utilized During Treatment: Gait belt;Rolling walker  OT Visit Diagnosis: Unsteadiness on feet (R26.81)   Activity Tolerance Patient tolerated treatment well   Patient Left in bed;with call bell/phone within reach;with bed alarm set   Nurse Communication Other (comment) (speaks of leaving on his own)        Time: 1040-1104 OT Time Calculation (min): 24 min  Charges: OT General Charges $OT Visit: 1 Visit OT Treatments $Self Care/Home Management : 23-37 mins  Charles Fox, OTA    Charles Fox Charles Fox 05/24/2021, 11:14 AM

## 2021-05-24 NOTE — Progress Notes (Signed)
Physical Therapy Treatment Patient Details Name: Charles Fox MRN: [id number] DOB: [birth year] Today's Date: 05/24/2021    History of Present Illness 70 yr old male who presents with AMS and hematuria/urinary retention (foley placed 8/15), diagnosis of UTI. PMH includes diabetes, CVA  hypertension, hyperlipidemia, homelessness, tobacco use disorder.    PT Comments    Patient consistently moving better with less cues and less physical assist. Due to decr cognition, do not feel pt will achieve modified independent goals with goals decreased to supervision goals overall. Requires minguard assist for maneuvering RW around obstacles safely.    Follow Up Recommendations  Home health PT (at ALF vs Group home (pt refusing shelter))     Equipment Recommendations  Other (comment) (rollator)    Recommendations for Other Services OT consult     Precautions / Restrictions Precautions Precautions: Fall Precaution Comments: foley cath    Mobility  Bed Mobility Overal bed mobility: Needs Assistance Bed Mobility: Supine to Sit;Sit to Supine     Supine to sit: Supervision;HOB elevated Sit to supine: Supervision;HOB elevated   General bed mobility comments: incr time and effort; vc for sequencing to be able to scoot out and get feet to floor    Transfers Overall transfer level: Needs assistance Equipment used: Rolling walker (2 wheeled) Transfers: Sit to/from Stand Sit to Stand: Supervision         General transfer comment: cues for safe hand placement during transfers  Ambulation/Gait Ambulation/Gait assistance: Min guard Gait Distance (Feet): 150 Feet Assistive device: Rolling walker (2 wheeled) Gait Pattern/deviations: Step-through pattern;Trunk flexed Gait velocity: reduced   General Gait Details: maneuvers around obstacles with supervision, at times very close to hitting legs of RW against legs of furniture   Stairs             Wheelchair Mobility     Modified Rankin (Stroke Patients Only)       Balance Overall balance assessment: Needs assistance Sitting-balance support: No upper extremity supported;Feet supported Sitting balance-Leahy Scale: Good     Standing balance support: No upper extremity supported Standing balance-Leahy Scale: Fair                              Cognition Arousal/Alertness: Awake/alert Behavior During Therapy: Flat affect Overall Cognitive Status: Impaired/Different from baseline Area of Impairment: Safety/judgement;Memory;Attention;Orientation;Problem solving                 Orientation Level: Place (at times speaking as if he was at home, but can state he is in the hospital "go over there and get my pants") Current Attention Level: Selective Memory: Decreased short-term memory   Safety/Judgement: Decreased awareness of safety;Decreased awareness of deficits Awareness: Intellectual Problem Solving: Requires verbal cues;Slow processing        Exercises      General Comments General comments (skin integrity, edema, etc.): due to decr cognition, do not feel pt will achieve modified independent goals with goals decreased to supervision goals      Pertinent Vitals/Pain Pain Assessment: No/denies pain    Home Living                      Prior Function            PT Goals (current goals can now be found in the care plan section) Acute Rehab PT Goals Patient Stated Goal: to get better PT Goal Formulation: Patient unable to participate in goal setting  Time For Goal Achievement: 06/07/21 Potential to Achieve Goals: Fair Progress towards PT goals: Goals downgraded-see care plan    Frequency    Min 3X/week      PT Plan Current plan remains appropriate    Co-evaluation              AM-PAC PT "6 Clicks" Mobility   Outcome Measure  Help needed turning from your back to your side while in a flat bed without using bedrails?: A Little Help needed  moving from lying on your back to sitting on the side of a flat bed without using bedrails?: A Little Help needed moving to and from a bed to a chair (including a wheelchair)?: A Little Help needed standing up from a chair using your arms (e.g., wheelchair or bedside chair)?: A Little Help needed to walk in hospital room?: A Little Help needed climbing 3-5 steps with a railing? : A Little 6 Click Score: 18    End of Session Equipment Utilized During Treatment: Gait belt Activity Tolerance: Patient tolerated treatment well Patient left: with call bell/phone within reach;in bed;with bed alarm set   PT Visit Diagnosis: Unsteadiness on feet (R26.81);Other abnormalities of gait and mobility (R26.89);Difficulty in walking, not elsewhere classified (R26.2)     Time: [id number] PT Time Calculation (min) (ACUTE ONLY): 16 min  Charges:  $Gait Training: 8-22 mins                      Charles Fox, PT Pager [phone]    Charles Fox 05/24/2021, 3:42 PM

## 2021-05-24 NOTE — Progress Notes (Signed)
FPTS Interim Progress Note  S: Sleeping comfortably.  O: BP 137/77 (BP Location: Left Arm)   Pulse 78   Temp 98.1 F (36.7 C) (Oral)   Resp 18   Ht '5\' 8"'$  (1.727 m)   Wt 104 kg   SpO2 93%   BMI 34.86 kg/m   General: 70 year old male sleeping comfortably on physical exam with no obvious signs of distress. Respiratory: No respiratory distress on room air  A/P: Patient sleeping comfortably.  No concerns at this time.  Remainder per day team progress note  Lurline Del, DO 05/24/2021, 8:48 PM PGY-3, [place] [facility] Service pager [phone]

## 2021-05-24 NOTE — Progress Notes (Addendum)
Family Medicine Teaching Service Daily Progress Note Intern Pager: [phone]  Patient name: Charles Fox Medical record number: [id number] Date of birth: 1950/11/23 Age: 70 y.o. Gender: male  Primary Care Provider: [name], MD Consultants: Psychiatry Code Status: Full  [name]:  8/19: Admitted to [facility] for altered mental status and hematuria 8/20: Patient has had continued hematuria with normal brain MRI and improved mentation 8/21: [facility] 12/30, determined to not have capacity 8/22: Discussion with son revealed patient does not have a safe place for discharge regarding family 8/23: PSA elevated.  Neurology recommended keeping catheter and rechecking PSA in 1 month 8/24: Voiding trial 8/25: Suicide precautions, trial failed, Foley reinserted 8/26: Suicide precautions removed  8/29: Agitated and combative, delirium precautions placed   Assessment and Plan: 70 year old male admitted for altered mental and hematuria, currently medically stable awaiting for placement.  AMS 2/2 dementia  agitation Medically stable for discharge. Patient still lacks capacity and is unsure why he is in the hospital and unsure why he has a foley catheter. He is alert and oriented to name, and place. - Appreciate TOC assistance with placement - Seroquel 100 mg nightly scheduled - Recommend neurology outpatient referral for dementia - PT as appropriate  Urinary retention Patient's foley catheter in place, with small healing lesion on glans of penis, near urethra. --Cr 1.38, will trend tomorrow. If worsening will d/c losartan - Flomax 0.4 mg daily - Recommend urology follow-up outpatient - Foley in place until outpatient urology removal in 1 month (06/19/21)  T2DM -Metformin 500 mg twice daily  Tobacco use: -Started nicotine gum   FEN/GI: Carb modified heart healthy PPx: Lovenox Dispo:SNF  pending placement . Barriers include foley catheter.   Subjective:  No  complaints overnight, patient resting comfortably in bed. He is still unsure as to why he is in the hospital.   Objective: Temp:  [97.8 F (36.6 C)-98.5 F (36.9 C)] 97.8 F (36.6 C) (09/05 0803) Pulse Rate:  [69-89] 74 (09/05 0803) Resp:  [17-20] 17 (09/05 0803) BP: (120-141)/(63-87) 137/80 (09/05 0803) SpO2:  [95 %-100 %] 98 % (09/05 0803) Physical Exam: General: Well appearing, sleepy Cardiovascular: RRR, [facility] Respiratory: CTABL, no wheezes or stridor Abdomen: Soft, non-tender, non-distended Extremities: No lesions or rash, pulses intact in all extremities G/U: Dr. Larae Grooms for chaperone, foley catheter in place, small lesions adjacent urethra, healing.   Laboratory: Recent Labs  Lab 05/24/21 0337  WBC 8.9  HGB 14.3  HCT 43.9  PLT 339   Recent Labs  Lab 05/24/21 0035  NA 135  K 4.6  CL 104  CO2 22  BUN 20  CREATININE 1.38*  CALCIUM 8.9  GLUCOSE 84     Imaging/Diagnostic Tests: None  Holley Bouche, MD 05/24/2021, 9:00 AM PGY-1, [place] Intern pager: [phone], text pages welcome

## 2021-05-25 ENCOUNTER — Inpatient Hospital Stay (HOSPITAL_COMMUNITY): Payer: Medicare HMO

## 2021-05-25 DIAGNOSIS — R319 Hematuria, unspecified: Secondary | ICD-10-CM | POA: Diagnosis not present

## 2021-05-25 DIAGNOSIS — N179 Acute kidney failure, unspecified: Secondary | ICD-10-CM | POA: Diagnosis not present

## 2021-05-25 DIAGNOSIS — R4182 Altered mental status, unspecified: Secondary | ICD-10-CM | POA: Diagnosis not present

## 2021-05-25 LAB — CREATININE, URINE, RANDOM: Creatinine, Urine: 155.45 mg/dL

## 2021-05-25 LAB — BASIC METABOLIC PANEL
Anion gap: 9 (ref 5–15)
BUN: 20 mg/dL (ref 8–23)
CO2: 23 mmol/L (ref 22–32)
Calcium: 8.8 mg/dL — ABNORMAL LOW (ref 8.9–10.3)
Chloride: 104 mmol/L (ref 98–111)
Creatinine, Ser: 1.39 mg/dL — ABNORMAL HIGH (ref 0.61–1.24)
GFR, Estimated: 56 mL/min — ABNORMAL LOW (ref >60)
Glucose, Bld: 72 mg/dL (ref 70–99)
Potassium: 4.2 mmol/L (ref 3.5–5.1)
Sodium: 136 mmol/L (ref 135–145)

## 2021-05-25 LAB — SODIUM, URINE, RANDOM: Sodium, Ur: 11 mmol/L

## 2021-05-25 MED ORDER — SODIUM CHLORIDE 0.9 % IV BOLUS: 1000.0000 mL | Freq: Once | INTRAVENOUS | Status: DC

## 2021-05-25 NOTE — Progress Notes (Signed)
Repeat bladder scan: 9 mL.

## 2021-05-25 NOTE — Progress Notes (Signed)
Unable to administer NS bolus. Pt refusing peripheral IV placement.

## 2021-05-25 NOTE — Plan of Care (Signed)

## 2021-05-25 NOTE — Progress Notes (Signed)
FPTS Interim Progress Note  S: Sleeping comfortably, no apparent distress  O: BP 128/83 (BP Location: Left Arm)   Pulse 94   Temp 98.6 F (37 C) (Oral)   Resp 17   Ht '5\' 8"'$  (1.727 m)   Wt 104 kg   SpO2 97%   BMI 34.86 kg/m   General: NAD, sleeping comfortably Respiratory: No respiratory distress  A/P: Patient is sleeping comfortably with no concerns at this time.  He is currently being worked up for an AKI.  Renal ultrasound has resulted which showed no hydronephrosis with increased bilateral renal parenchyma echogenicity typical of chronic medical renal disease.  Remainder per day team progress note  Lurline Del, DO 05/25/2021, 9:34 PM PGY-3, [place] Medicine Service pager [phone]

## 2021-05-25 NOTE — TOC Progression Note (Signed)
Transition of Care [facility]) - Progression Note    Patient Details  Name: Charles Fox MRN: [id number] Date of Birth: 13-Sep-1951  Transition [facility]) CM/SW [place], RN Phone Number: 05/25/2021, 12:16 PM  Clinical Narrative:    Case management called and left a message with the patient's son, Charles Fox - unable to reach him by phone.  I called and left a message with Towanda Malkin, DSS MSW [phone] to assist with obtaining family member numbers to assist with medical decision making for the patient since he does not have capacity to make decisions on his own.  CM will continue to reach out to family, otherwise, I might need to call APS and ask for emergency guardianship for medical decision making.  CM and MSW with DTP will continue to follow for ALF admission once able to find accepting facility.   Expected Discharge Plan: Assisted Living Barriers to Discharge: Homeless with medical needs, Family Issues, Unsafe home situation  Expected Discharge Plan and Services Expected Discharge Plan: Assisted Living   Discharge Planning Services: CM Consult   Living arrangements for the past 2 months: Homeless (Patient was living in his truck prior to hospitalization.)                                       Social Determinants of Health (SDOH) Interventions    Readmission Risk Interventions No flowsheet data found.

## 2021-05-25 NOTE — Progress Notes (Signed)
Pt refused vital signs, ripped off BP cuff and stated, "I don't understand why you still need to do that". Educated patient. Will attempt at a later time.

## 2021-05-25 NOTE — Progress Notes (Signed)
Family Medicine Teaching Service Daily Progress Note Intern Pager: [phone]  Patient name: Charles Fox Medical record number: [id number] Date of birth: [birth year] Age: 70 y.o. Gender: male  Primary Care Provider: [name], MD Consultants: psychiatry (signed off) Code Status: FULL  [name]:  8/19: Admitted to [facility] for altered mental status and hematuria 8/20: Patient with continued hematuria with normal brain MRI and improved mentation  8/21: [facility] 12/30, determined to not have capacity 8/22: Discussion with son revealed patient does not have a safe place for discharge 8/23: PSA elevated.  Urology recommending keeping catheter and rechecking PSA in 1 month. 8/24: Voiding trial 8/25: Suicide precautions, trial failed, Foley reinserted 8/26: Suicide precautions removed 8/29 agitated and combative, delirium precautions in place 9/5: AKI  Assessment and Plan:  70 year old male admitted for altered mental status and hematuria, now with AKI.  Past medical history is notable for type 2 diabetes.  AKI Patient with a baseline Cr of 0.8-0.9, 1.38 and 1.39 over the past two days. Etiology unknown at this point, but both pre-renal and post-renal are possible. UOP 1 L over the past 24 hrs. 0.4 ml/kg/hr. Bladder scan of 9 mL this AM. Foley clearly working. Could still be obstructed in the ureters.  -Losartan held -UA -FeNA  -Check orthostatics, then consider IVF bolus (no HF Hx) -Renal US  Urinary retention Urology recommending continued placement of Foley cath until outpatient follow-up and PSA recheck in 1 month.  Patient with foley in place. -Flomax 0.4 mg daily  AMS 2/2 Dementia  agitation Mental status appears to be at baseline. On previous assessment patient has been found to lack capacity.  -Continue Seroquel 100 mg nightly -Appreciate TOC assistance with placement - Recommend neurology outpatient referral for dementia - PT as  appropriate  Type 2 diabetes mellitus - Metformin 500 mg twice daily  Tobacco use -Started nicotine gum  FEN/GI: carb modified heart healthy PPx: Lovenox Dispo:pending placement, barriers include foley cath  Subjective:  No complaints overnight, patient resting comfortably in bed. He is updated on the plan for his AKI.   Objective: Temp:  [97.3 F (36.3 C)-98.2 F (36.8 C)] 98.2 F (36.8 C) (09/06 0445) Pulse Rate:  [77-82] 78 (09/06 0445) Resp:  [17-18] 18 (09/06 0445) BP: (116-137)/(67-77) 118/67 (09/06 0445) SpO2:  [93 %-100 %] 94 % (09/06 0445) Physical Exam Vitals reviewed.  Cardiovascular:     Rate and Rhythm: Normal rate and regular rhythm.  Pulmonary:     Effort: Pulmonary effort is normal.     Breath sounds: Normal breath sounds.  Abdominal:     General: Bowel sounds are normal.     Palpations: Abdomen is soft.     Tenderness: There is no abdominal tenderness.  Neurological:     Mental Status: He is alert.     Laboratory: Recent Labs  Lab 05/24/21 0337  WBC 8.9  HGB 14.3  HCT 43.9  PLT 339   Recent Labs  Lab 05/24/21 0035 05/25/21 0305  NA 135 136  K 4.6 4.2  CL 104 104  CO2 22 23  BUN 20 20  CREATININE 1.38* 1.39*  CALCIUM 8.9 8.8*  GLUCOSE 84 72      Imaging/Diagnostic Tests: UA and urine studies as above. Renal US  Corky Sox PGY-1, Psychiatry [place] Intern pager: [phone], text pages welcome

## 2021-05-25 NOTE — Progress Notes (Signed)
Patient not cooperating with orthostatic vital signs. Unable to obtain a complete set.

## 2021-05-26 DIAGNOSIS — R4182 Altered mental status, unspecified: Secondary | ICD-10-CM | POA: Diagnosis not present

## 2021-05-26 LAB — BASIC METABOLIC PANEL
Anion gap: 11 (ref 5–15)
BUN: 18 mg/dL (ref 8–23)
CO2: 22 mmol/L (ref 22–32)
Calcium: 9.3 mg/dL (ref 8.9–10.3)
Chloride: 103 mmol/L (ref 98–111)
Creatinine, Ser: 1.3 mg/dL — ABNORMAL HIGH (ref 0.61–1.24)
GFR, Estimated: >60 mL/min (ref >60)
Glucose, Bld: 99 mg/dL (ref 70–99)
Potassium: 4.4 mmol/L (ref 3.5–5.1)
Sodium: 136 mmol/L (ref 135–145)

## 2021-05-26 LAB — URINALYSIS, MICROSCOPIC (REFLEX)

## 2021-05-26 LAB — URINALYSIS, ROUTINE W REFLEX MICROSCOPIC
Bilirubin Urine: NEGATIVE
Glucose, UA: NEGATIVE mg/dL
Ketones, ur: NEGATIVE mg/dL
Nitrite: NEGATIVE
Protein, ur: NEGATIVE mg/dL
Specific Gravity, Urine: 1.01 (ref 1.005–1.030)
pH: 6 (ref 5.0–8.0)

## 2021-05-26 MED ORDER — SODIUM CHLORIDE 0.9 % IV BOLUS
500.0000 mL | Freq: Once | INTRAVENOUS | Status: AC
  Administered 2021-05-26: 500 mL via INTRAVENOUS

## 2021-05-26 NOTE — Progress Notes (Signed)
Family Medicine Teaching Service Daily Progress Note Intern Pager: [phone]  Patient name: Charles Fox Medical record number: [id number] Date of birth: 1951/04/11 Age: 70 y.o. Gender: male  Primary Care Provider: [name], MD Consultants: None Code Status: FULL  [name]:  8/19: Admitted to [facility] for altered mental status and hematuria 8/20: Patient with continued hematuria with normal brain MRI and improved mentation  8/21: [facility] 12/30, determined to not have capacity 8/22: Discussion with son revealed patient does not have a safe place for discharge 8/23: PSA elevated.  Urology recommending keeping catheter and rechecking PSA in 1 month. 8/24: Voiding trial 8/25: Suicide precautions, trial failed, Foley reinserted 8/26: Suicide precautions removed 8/29 agitated and combative, delirium precautions in place 9/5: AKI  Assessment and Plan:  70 year old male admitted for altered mental status and hematuria, now with AKI.  Past medical history is notable for type 2 diabetes.  AKI Patient with baseline Cr of 0.8-0.9. Cr from 1.39 to 1.30, over the past two days. UOP 650 mL over past 24 hrs (AM UOP not documented though). Renal US showing no hydronephrosis, increased parenchymal echogenicity, typical of chronic renal disease. Does not appear obstruction is playing a role in this AKI. FeNa 0.1%, indicating a likely pre-renal etiology. UA remarkable only for large Hgb and moderate WBCs. Patient has been non-cooperative with orthostatics and IVF.   -Push PO fluids, nursing recruited to help -Try bolus again, 500 mL NS -CTM with daily BMPs -Home losartan held   Urinary retention Urology recommending continued placement of Foley cath until outpatient follow-up and PSA recheck in 1 month.  Patient with foley in place. -Flomax 0.4 mg daily   AMS 2/2 Dementia  agitation Mental status appears to be at baseline. On previous assessment patient has been found to  lack capacity.  -Continue Seroquel 100 mg nightly -Appreciate TOC assistance with placement - Recommend neurology outpatient referral for dementia - PT as appropriate   Type 2 diabetes mellitus - Metformin 500 mg twice daily   Tobacco use -Started nicotine gum   FEN/GI: carb modified heart healthy PPx: Lovenox Dispo:pending placement, barriers include foley cath  Subjective:  On assessment this morning, patient appears to be at his baseline mental status, alert and oriented to name and place, but no more. He is counseled again on his AKI and is encouraged to accept IVF and drink PO fluids. ROS negative for CP, SoB, N/V, diarrhea.   Objective: Temp:  [97.4 F (36.3 C)-98.6 F (37 C)] 97.7 F (36.5 C) (09/07 0753) Pulse Rate:  [74-101] 74 (09/07 0753) Resp:  [16-18] 16 (09/07 0753) BP: (113-155)/(72-90) 155/75 (09/07 0753) SpO2:  [95 %-99 %] 99 % (09/07 0753) Physical Exam Vitals reviewed.  Cardiovascular:     Rate and Rhythm: Normal rate and regular rhythm.     Heart sounds: No murmur heard. Pulmonary:     Breath sounds: Normal breath sounds.  Abdominal:     General: Bowel sounds are normal.     Palpations: Abdomen is soft.     Tenderness: There is no abdominal tenderness.  Neurological:     Mental Status: He is alert.     Laboratory: Recent Labs  Lab 05/24/21 0337  WBC 8.9  HGB 14.3  HCT 43.9  PLT 339   Recent Labs  Lab 05/24/21 0035 05/25/21 0305  NA 135 136  K 4.6 4.2  CL 104 104  CO2 22 23  BUN 20 20  CREATININE 1.38* 1.39*  CALCIUM 8.9 8.8*  GLUCOSE 84 72    Imaging/Diagnostic Tests: FeNa and Renal US and UA as above  Corky Sox PGY-1, Psychiatry FPTS Intern pager: [phone], text pages welcome

## 2021-05-26 NOTE — Progress Notes (Signed)
Physical Therapy Treatment Patient Details Name: Charles Fox MRN: [id number] DOB: [birth year] Today's Date: 05/26/2021    History of Present Illness 70 yr old male who presents with AMS and hematuria/urinary retention (foley placed 8/15), diagnosis of UTI. PMH includes diabetes, CVA  hypertension, hyperlipidemia, homelessness, tobacco use disorder.    PT Comments    Patient much clearer cognitively this date. Fully aware he is in the hospital. He prefers rollator over RW and practiced with rollator. He tends to drift right and left more with rollator, but did not have imbalance or run into objects (although was close on occasion). IF cognition continues to improve/stabilize, would be reasonable to upgrade goals to modified independent with device vs independent without device. Discharge plan updated to reflect continued need for PT.     Follow Up Recommendations  SNF (has medical need for SNF and continued PT needs)     Equipment Recommendations  Other (comment) (rollator)    Recommendations for Other Services       Precautions / Restrictions Precautions Precautions: Fall Precaution Comments: foley cath    Mobility  Bed Mobility Overal bed mobility: Needs Assistance Bed Mobility: Supine to Sit     Supine to sit: Supervision     General bed mobility comments: incr time and effort; no cues but supervision for safety due to recent decr cognition    Transfers Overall transfer level: Needs assistance Equipment used: 4-wheeled walker Transfers: Sit to/from Stand Sit to Stand: Supervision         General transfer comment: cues for safe hand placement during transfers and locking/unlocking brakes  Ambulation/Gait Ambulation/Gait assistance: Min guard Gait Distance (Feet): 150 Feet (seated rest; 150) Assistive device: 4-wheeled walker Gait Pattern/deviations: Step-through pattern;Trunk flexed;Drifts right/left Gait velocity: reduced   General Gait Details:  maneuvers around obstacles with supervision, at times very close to hitting legs of rollator against legs of furniture; drifts left and right with head turns   Stairs             Wheelchair Mobility    Modified Rankin (Stroke Patients Only)       Balance Overall balance assessment: Needs assistance Sitting-balance support: No upper extremity supported;Feet supported Sitting balance-Leahy Scale: Good Sitting balance - Comments: requiring UE support   Standing balance support: No upper extremity supported Standing balance-Leahy Scale: Fair                              Cognition Arousal/Alertness: Awake/alert Behavior During Therapy: Flat affect Overall Cognitive Status: Impaired/Different from baseline Area of Impairment: Safety/judgement;Memory                     Memory: Decreased short-term memory   Safety/Judgement: Decreased awareness of safety;Decreased awareness of deficits Awareness: Intellectual Problem Solving: Requires verbal cues;Slow processing General Comments: Much clearer today. Still some decreased awareness with steering rollator as he talks and turns his head.      Exercises      General Comments        Pertinent Vitals/Pain Pain Assessment: No/denies pain    Home Living                      Prior Function            PT Goals (current goals can now be found in the care plan section) Acute Rehab PT Goals Patient Stated Goal: to get better Time For Goal  Achievement: 06/07/21 Potential to Achieve Goals: Fair Progress towards PT goals: Progressing toward goals    Frequency    Min 2X/week      PT Plan Discharge plan needs to be updated (pt cannot go to ALF with foley catheter; continues to benefit from PT as working towards independence if cognition allows)    Co-evaluation              AM-PAC PT "6 Clicks" Mobility   Outcome Measure  Help needed turning from your back to your side while in  a flat bed without using bedrails?: A Little Help needed moving from lying on your back to sitting on the side of a flat bed without using bedrails?: A Little Help needed moving to and from a bed to a chair (including a wheelchair)?: A Little Help needed standing up from a chair using your arms (e.g., wheelchair or bedside chair)?: A Little Help needed to walk in hospital room?: A Little Help needed climbing 3-5 steps with a railing? : A Little 6 Click Score: 18    End of Session Equipment Utilized During Treatment: Gait belt Activity Tolerance: Patient tolerated treatment well Patient left: with call bell/phone within reach;in bed;with bed alarm set Nurse Communication: Mobility status PT Visit Diagnosis: Unsteadiness on feet (R26.81);Other abnormalities of gait and mobility (R26.89);Difficulty in walking, not elsewhere classified (R26.2)     Time: [id number] PT Time Calculation (min) (ACUTE ONLY): 22 min  Charges:  $Gait Training: 8-22 mins                      Arby Barrette, PT Pager [phone]    Rexanne Mano 05/26/2021, 12:30 PM

## 2021-05-26 NOTE — TOC Progression Note (Signed)
Transition of Care [facility]) - Progression Note    Patient Details  Name: WESTLEY BLASS MRN: [id number] Date of Birth: November 20, 1950  Transition of Care [facility]) CM/SW [place], RN Phone Number: 05/26/2021, 11:02 AM  Clinical Narrative:    Case management met with the patient at the bedside.  The patient was able to answer simple questions and states that he has not been in contact with family, including son and daughters and states that he will need assistance with housing and financial assistance.  The patient was unaware how he arrived to the hospital.  I spoke with the patient and explained that I have been unable to reach family and might have to call APS to assist the patient with medical and financial decision making and patient was in agreement.  I called and spoke with Cristy Friedlander, MSW with DSS and she states that patient had an open case more than a year ago - which is now closed.  She does not remember any involved daughters but states that she has spoken with his son in the past.  She asked that I call APS and file a report.  CM called and filed an APS report.  The report was filed with Mannie Stabile, APS SW.  APS to return my call once the case is reviewed.  I requested emergency guardianship through [place] and asked that they handle court proceedings as necessary to assist with placement and financial concerns regarding placement.  CM and MSW with DTP Team will continue to follow the patient for possible guardianship and ALF versus SNF placement.   Expected Discharge Plan: Memory Care Barriers to Discharge: Homeless with medical needs, Family Issues, Unsafe home situation  Expected Discharge Plan and Services Expected Discharge Plan: Memory Care   Discharge Planning Services: CM Consult   Living arrangements for the past 2 months: Homeless (Patient was living in his truck prior to hospitalization.)                                       Social  Determinants of Health (SDOH) Interventions    Readmission Risk Interventions No flowsheet data found.

## 2021-05-26 NOTE — Progress Notes (Signed)
FPTS Interim Progress Note  S: Went to evaluate patient along with Dr. Zigmund Daniel, patient was sleeping comfortably in bed and so we did not awaken him.  He did not appear in any distress.  O: BP 124/73 (BP Location: Right Arm)   Pulse 79   Temp 98.4 F (36.9 C) (Oral)   Resp 19   Ht '5\' 8"'$  (1.727 m)   Wt 104 kg   SpO2 97%   BMI 34.86 kg/m   General: Sleeping comfortably in bed, no apparent distress Respiratory: No apparent respiratory distress  A/P: Patient sleeping comfortably in no apparent distress, he received a 500 cc bolus earlier today for his AKI.  We will check labs in the morning.  Remainder per day team progress note.  Lurline Del, DO 05/26/2021, 10:48 PM PGY-3, [place] Medicine Service pager [phone]

## 2021-05-26 NOTE — Progress Notes (Signed)
Social worker from [facility] met with patient today for plan of discharge.

## 2021-05-27 DIAGNOSIS — R4182 Altered mental status, unspecified: Secondary | ICD-10-CM | POA: Diagnosis not present

## 2021-05-27 LAB — BASIC METABOLIC PANEL
Anion gap: 9 (ref 5–15)
BUN: 16 mg/dL (ref 8–23)
CO2: 21 mmol/L — ABNORMAL LOW (ref 22–32)
Calcium: 8.8 mg/dL — ABNORMAL LOW (ref 8.9–10.3)
Chloride: 105 mmol/L (ref 98–111)
Creatinine, Ser: 1.24 mg/dL (ref 0.61–1.24)
GFR, Estimated: >60 mL/min (ref >60)
Glucose, Bld: 106 mg/dL — ABNORMAL HIGH (ref 70–99)
Potassium: 4.3 mmol/L (ref 3.5–5.1)
Sodium: 135 mmol/L (ref 135–145)

## 2021-05-27 MED ORDER — SODIUM CHLORIDE 0.9 % IV BOLUS: 500.0000 mL | Freq: Once | INTRAVENOUS | Status: DC

## 2021-05-27 MED ORDER — SODIUM CHLORIDE 0.9 % IV BOLUS
500.0000 mL | Freq: Once | INTRAVENOUS | Status: AC
  Administered 2021-05-27: 500 mL via INTRAVENOUS

## 2021-05-27 NOTE — Progress Notes (Signed)
Patient began c/o abdominal discomfort/pain and the urge to void. Despite flushing output was not increasing. Bladder scan showed 700 cc. New order for removing old foley and replacing was received. Old foley looked like it had a blood clot in the tip when removed. New foley placed and patient voided 1000cc total.

## 2021-05-27 NOTE — Plan of Care (Signed)
  Problem: Education: Goal: Knowledge of General Education information will improve Description Including pain rating scale, medication(s)/side effects and non-pharmacologic comfort measures Outcome: Progressing   

## 2021-05-27 NOTE — Progress Notes (Signed)
Family Medicine Teaching Service Daily Progress Note Intern Pager: [phone]  Patient name: Charles Fox Medical record number: [id number] Date of birth: 05/27/1951 Age: 70 y.o. Gender: male  Primary Care Provider: [name], MD Consultants: none Code Status: FULL  [name]:  8/19: Admitted to [facility] for altered mental status and hematuria 8/20: Patient with continued hematuria with normal brain MRI and improved mentation  8/21: [facility] 12/30, determined to not have capacity 8/22: Discussion with son revealed patient does not have a safe place for discharge 8/23: PSA elevated.  Urology recommending keeping catheter and rechecking PSA in 1 month. 8/24: Voiding trial 8/25: Suicide precautions, trial failed, Foley reinserted 8/26: Suicide precautions removed 8/29 agitated and combative, delirium precautions in place 9/5: AKI  Assessment and Plan:  71 year old male admitted for AMS and hematuria, concern for bladder malignancy, now with AKI. PMHx notable for T2DM.  AKI Patient with baseline Cr 0.8-0.9. Cr from 1.39->1.30->1.24 this AM s/p PO fluids and 500 mL bolus. Likely pre-renal given no hydronephrosis on renal US and FeNa of 0.1%. -Foley in  -Strict I&O's  -Continue to hold losartan -Repeat 500 mL bolus -Push PO fluids, nursing onboard -CTM with daily BMPs  Urinary retention and hematuria Patient presented with urinary obstruction, requiring a foley. Told to follow up with Urology. Re-admitted with AMS and hematuria. Hematuria thought to be due to traumatic foley insertion. CT A/P concerning for bladder malignancy. Continued microscopic hematuria on UA.  -Foley in place -Flomax 0.4 mg daily  AMS 2/2 dementia  agitation Outpatient diagnosis of dementia. Mental status at baseline is alert and oriented to person and place. Currently at baseline. On previous assessment patient has been noted to lack capacity. CSW working on guardianship and  placement.  -Continue Seroquel 100 mg nightly -TOC to assist with placement -Neuro referral outpatient for dementia -PT as appropriate  T2DM -Continue metformin 500 mg BID  Tobacco use disorder -Nicotine gum   FEN/GI: carb modified, heart healthy PPx: Lovenox Dispo: pending guardianship and placement  Subjective:  On assessment this morning, patient appears to be at baseline mental status,alert and oriented to person and place, but not time. Pt encouraged to take in PO fluids. ROS negative for CP, SOB, N/V, diarrhea  Objective: Temp:  [98 F (36.7 C)-98.7 F (37.1 C)] 98.3 F (36.8 C) (09/08 0403) Pulse Rate:  [79-89] 89 (09/08 0403) Resp:  [17-20] 20 (09/08 0403) BP: (117-128)/(68-104) 128/104 (09/08 0403) SpO2:  [95 %-98 %] 97 % (09/08 0403) Physical Exam Vitals reviewed.  Cardiovascular:     Rate and Rhythm: Normal rate and regular rhythm.     Pulses: Normal pulses.  Pulmonary:     Effort: Pulmonary effort is normal.     Breath sounds: Normal breath sounds.  Abdominal:     General: Bowel sounds are normal.     Palpations: Abdomen is soft.     Tenderness: There is no abdominal tenderness.  Neurological:     Mental Status: He is alert.     Laboratory: Recent Labs  Lab 05/24/21 0337  WBC 8.9  HGB 14.3  HCT 43.9  PLT 339   Recent Labs  Lab 05/25/21 0305 05/26/21 0847 05/27/21 0239  NA 136 136 135  K 4.2 4.4 4.3  CL 104 103 105  CO2 23 22 21*  BUN '20 18 16  '$ CREATININE 1.39* 1.30* 1.24  CALCIUM 8.8* 9.3 8.8*  GLUCOSE 72 99 106*      Imaging/Diagnostic Tests:  No new tests.   Corky Sox PGY-1, Psychiatry [place] Intern pager: [phone], text pages welcome

## 2021-05-27 NOTE — Progress Notes (Signed)
FPTS Interim Progress Note  S: Sleeping comfortably and in no apparent distress  O: BP (!) 155/77 (BP Location: Right Arm)   Pulse 92   Temp 98.4 F (36.9 C) (Oral)   Resp 18   Ht '5\' 8"'$  (1.727 m)   Wt 104 kg   SpO2 100%   BMI 34.86 kg/m   General: NAD, pleasant, able to participate in exam Respiratory: No respiratory distress Skin: warm and dry, no rashes noted  A/P: Patient sleeping comfortably at the time of my exam and in no apparent distress.  Noted that he had a Foley catheter change due to a blood clot in the tip of the previous Foley and urinary retention which seems to have resolved after the new Foley was placed.  Patient does not seem in any distress at this time.  We will monitor through the night.  Lurline Del, DO 05/27/2021, 7:54 PM PGY-3, [place] Medicine Service pager [phone]

## 2021-05-27 NOTE — TOC Progression Note (Signed)
Transition of Care [facility]) - Progression Note    Patient Details  Name: Charles Fox MRN: [id number] Date of Birth: 12-Oct-1950  Transition of Care [facility]) CM/SW [place], RN Phone Number: 05/27/2021, 9:24 AM  Clinical Narrative:    Case management spoke with Rushie Chestnut, MSW with APS and she met with the patient on 05/26/2021 in the hospital room to start APS investigation regarding patient's need for emergency guardianship if DSS unable to find responsible family member to assist with patient's care needs and probable placement at needed Memory care family.  Jimmye Norman, MSW was given contact information for the patient's son, [name], to contact along with contact information for Donnita Falls, LCSW with [facility].  CM and MSW with DTP will continue to follow the patient for TOC needs.   Expected Discharge Plan: Memory Care Barriers to Discharge: Homeless with medical needs, Continued Medical Work up  Expected Discharge Plan and Services Expected Discharge Plan: Memory Care In-house Referral: Clinical Social Work (APS referral placed) Discharge Planning Services: CM Consult Post Acute Care Choice:  (ALF versus SNF placement) Living arrangements for the past 2 months: Homeless (Patient was living in his truck after failing to pay rent on a trailer.)                                       Social Determinants of Health (SDOH) Interventions    Readmission Risk Interventions No flowsheet data found.

## 2021-05-27 NOTE — Progress Notes (Signed)
Occupational Therapy Treatment Patient Details Name: Charles Fox MRN: [id number] DOB: Mar 22, 1951 Today's Date: 05/27/2021    History of present illness 70 yr old male who presents with AMS and hematuria/urinary retention (foley placed 8/15), diagnosis of UTI. PMH includes diabetes, CVA  hypertension, hyperlipidemia, homelessness, tobacco use disorder.   OT comments  Patient seated on EOB when therapist arrived.  When patient stood, it was noticed that his gown was soiled. Patient stated he went to bathroom earlier and was dirty from when he went.  Setup and supervision for donning gown and washing hands. Patient continues to have cognitive deficits.  Acute OT to continue to follow.   Follow Up Recommendations  SNF;Supervision/Assistance - 24 hour    Equipment Recommendations       Recommendations for Other Services      Precautions / Restrictions Precautions Precautions: Fall Precaution Comments: foley cath       Mobility Bed Mobility               General bed mobility comments: seated on eob when therapist arrived    Transfers Overall transfer level: Needs assistance Equipment used: Rolling walker (2 wheeled) Transfers: Sit to/from Stand Sit to Stand: Supervision Stand pivot transfers: Supervision       General transfer comment: cues to use rw`    Balance Overall balance assessment: Needs assistance Sitting-balance support: No upper extremity supported;Feet supported Sitting balance-Leahy Scale: Good Sitting balance - Comments: independent with sitting balance   Standing balance support: No upper extremity supported Standing balance-Leahy Scale: Fair Standing balance comment: Staff states patient is taking himself to bathroom                           ADL either performed or assessed with clinical judgement   ADL Overall ADL's : Needs assistance/impaired     Grooming: Wash/dry hands;Wash/dry face Grooming Details (indicate cue type and  reason): Grooming performed standing at sink with supervision due to safety         Upper Body Dressing : Supervision/safety Upper Body Dressing Details (indicate cue type and reason): To down gown                 Functional mobility during ADLs: Supervision/safety;Rolling walker General ADL Comments: requires cues to use rw     Vision       Perception     Praxis      Cognition Arousal/Alertness: Awake/alert Behavior During Therapy: Flat affect Overall Cognitive Status: Impaired/Different from baseline Area of Impairment: Safety/judgement;Memory                 Orientation Level: Place Current Attention Level: Selective Memory: Decreased short-term memory   Safety/Judgement: Decreased awareness of safety;Decreased awareness of deficits Awareness: Intellectual Problem Solving: Requires verbal cues;Slow processing General Comments: Patient aware that he is in hospital but questions why he is not allowed to leave        Exercises     Shoulder Instructions       General Comments      Pertinent Vitals/ Pain       Pain Assessment: 0-10 Pain Score: 3  Faces Pain Scale: Hurts little more Pain Location: lower back Pain Descriptors / Indicators: Grimacing;Aching Pain Intervention(s): Monitored during session  Home Living  Prior Functioning/Environment              Frequency  Min 2X/week        Progress Toward Goals  OT Goals(current goals can now be found in the care plan section)  Progress towards OT goals: Progressing toward goals  Acute Rehab OT Goals Patient Stated Goal: to go home OT Goal Formulation: With patient Time For Goal Achievement: 05/24/21 Potential to Achieve Goals: Fair ADL Goals Pt Will Perform Grooming: with modified independence;standing Pt Will Perform Upper Body Bathing: with modified independence;sitting Pt Will Perform Lower Body Bathing: with  modified independence;sit to/from stand Pt Will Perform Upper Body Dressing: with modified independence;sitting Pt Will Perform Lower Body Dressing: with modified independence;sit to/from stand Pt Will Transfer to Toilet: with modified independence;ambulating Pt Will Perform Toileting - Clothing Manipulation and hygiene: with modified independence;sit to/from stand  Plan Discharge plan remains appropriate    Co-evaluation                 AM-PAC OT "6 Clicks" Daily Activity     Outcome Measure   Help from another person eating meals?: None Help from another person taking care of personal grooming?: A Little Help from another person toileting, which includes using toliet, bedpan, or urinal?: A Little Help from another person bathing (including washing, rinsing, drying)?: A Little Help from another person to put on and taking off regular upper body clothing?: A Little Help from another person to put on and taking off regular lower body clothing?: A Little 6 Click Score: 19    End of Session Equipment Utilized During Treatment: Gait belt;Rolling walker  OT Visit Diagnosis: Unsteadiness on feet (R26.81)   Activity Tolerance Patient tolerated treatment well   Patient Left in chair;with call bell/phone within reach;with chair alarm set   Nurse Communication Mobility status        Time: [id number] OT Time Calculation (min): 20 min  Charges: OT General Charges $OT Visit: 1 Visit OT Treatments $Self Care/Home Management : 8-22 mins  Lodema Hong, OTA    Brenda Cowher [name] 05/27/2021, 11:55 AM

## 2021-05-28 DIAGNOSIS — R4182 Altered mental status, unspecified: Secondary | ICD-10-CM | POA: Diagnosis not present

## 2021-05-28 LAB — BASIC METABOLIC PANEL
Anion gap: 9 (ref 5–15)
BUN: 9 mg/dL (ref 8–23)
CO2: 24 mmol/L (ref 22–32)
Calcium: 8.9 mg/dL (ref 8.9–10.3)
Chloride: 105 mmol/L (ref 98–111)
Creatinine, Ser: 1 mg/dL (ref 0.61–1.24)
GFR, Estimated: >60 mL/min (ref >60)
Glucose, Bld: 88 mg/dL (ref 70–99)
Potassium: 3.9 mmol/L (ref 3.5–5.1)
Sodium: 138 mmol/L (ref 135–145)

## 2021-05-28 MED ORDER — LOSARTAN POTASSIUM 50 MG PO TABS
50.0000 mg | ORAL_TABLET | Freq: Every day | ORAL | Status: DC
  Administered 2021-05-28 – 2021-09-06 (×102): 50 mg via ORAL
  Filled 2021-05-28 (×102): qty 1

## 2021-05-28 NOTE — TOC Progression Note (Addendum)
Transition of Care [facility]) - Progression Note    Patient Details  Name: Charles Fox MRN: [id number] Date of Birth: 1951/05/24  Transition of Care [facility]) CM/SW [place], RN Phone Number: 05/28/2021, 1:59 PM  Clinical Narrative:    Case management called and was able to speak with the patient's son, [name] on the phone and he states that he was willing to make medical decisions for the patient at this time and sign the patient into a SNF/ALF as needed for post-discharge care of the patient.  The son, girlfriend and children are currently living with friends at this time but plan to find housing at a later date.  The patient's son states that he is agreeable to have the patient admit to a skilled nursing facility or ALF for care needs as needed until they could take the patient home with them at a later date.  I explained to the son that APS would call and speak with him along with financial counseling to assist with patient with needs regarding placement.  The patient's son states that he will come and visit with the patient in the hospital this weekend and transport the patient's truck to his home if possible.  The son states that the patient receives about 1600/month social security check.   CM and MSW with DTP Team will continue to follow the patient for transitions of care needs.   Expected Discharge Plan: Memory Care Barriers to Discharge: Homeless with medical needs, Continued Medical Work up  Expected Discharge Plan and Services Expected Discharge Plan: Memory Care In-house Referral: Clinical Social Work (APS referral placed) Discharge Planning Services: CM Consult Post Acute Care Choice:  (ALF versus SNF placement) Living arrangements for the past 2 months: Homeless (Patient was living in his truck after failing to pay rent on a trailer.)                                       Social Determinants of Health (SDOH) Interventions     Readmission Risk Interventions No flowsheet data found.

## 2021-05-28 NOTE — Consult Note (Signed)
   [facility] Inpatient Consult   05/28/2021  Charles Fox 1951-07-16 [id number]  [place] [facility] [ACO] Patient:  [employer] Medicare  Primary Care Provider:  [name], MD, [place] Team  Reviewed electronic medical record for encounter with the inpatient [facility] RNCM to review for other contacts for patient and progress notes for disposition needs.  Plan:  Reach out via messaging to [facility] team for possible more information regarding patient's vehicle and family members.  1:11 pm call received from [place] regarding follow up and no additional information.  The patient's son was the contact for the Embedded provider's office as well.  Of note, [facility] Care Management does not replace or interfere with any arrangements made by the inpatient [facility] team.  For questions, please contact:  [name], RN BSN [facility] Liaison  [phone] business mobile phone Toll free office [phone]  Fax number: [phone] [email or web address]

## 2021-05-28 NOTE — Progress Notes (Signed)
Family Medicine Teaching Service Daily Progress Note Intern Pager: [phone]  Patient name: Charles Fox Medical record number: [id number] Date of birth: 08-16-1951 Age: 70 y.o. Gender: male  Primary Care Provider: [name], MD Consultants: None Code Status: FULL  [name]:  8/19: Admitted to [facility] for altered mental status and hematuria 8/20: Patient with continued hematuria with normal brain MRI and improved mentation  8/21: [facility] 12/30, determined to not have capacity 8/22: Discussion with son revealed patient does not have a safe place for discharge 8/23: PSA elevated.  Urology recommending keeping catheter and rechecking PSA in 1 month. 8/24: Voiding trial 8/25: Suicide precautions, trial failed, Foley reinserted 8/26: Suicide precautions removed 8/29 agitated and combative, delirium precautions in place 9/5: AKI  Assessment and Plan:  70 year old male admitted for AMS and hematuria, concern for bladder malignancy, now with AKI. PMHx notable for T2DM.   AKI (resolving) Patient with baseline Cr 0.8-0.9. Cr from 1.39->1.30->1.24 this AM s/p PO fluids and 2x 500 mL bolus. Likely pre-renal given no hydronephrosis on renal US and FeNa of 0.1%. -Foley in  -Strict I&O's  -Restarting Losartan 50 mg -CTM with daily BMPs   Urinary retention and hematuria Patient presented with urinary obstruction, requiring a foley. Told to follow up with Urology. Re-admitted with AMS and hematuria. Hematuria thought to be due to traumatic foley insertion. CT A/P concerning for bladder malignancy. Continued microscopic hematuria on UA. Patient with urinary retention yesterday (bladder scan of 700 mL), requiring re-placement of foley. Found to have a blood clot in the line.  -Foley in place -Flomax 0.4 mg daily   AMS 2/2 dementia  agitation Outpatient diagnosis of dementia. Mental status at baseline is alert and oriented to person and place. Currently at baseline. On  previous assessment patient has been noted to lack capacity. CSW working on guardianship and placement.  -Continue Seroquel 100 mg nightly -TOC to assist with placement -Neuro referral outpatient for dementia -PT as appropriate   T2DM -Continue metformin 500 mg BID   Tobacco use disorder -Nicotine gum   FEN/GI: carb modified, heart healthy PPx: Lovenox Dispo: pending guardianship and placement, OT recommending SNF  Subjective:  This morning patient has no complaints. He is alert and oriented to place and name, but no more. He is updated on improvement in his AKI. ROS negative for CP, SoB, N/V, diarrhea.   Objective: Temp:  [97.4 F (36.3 C)-98.5 F (36.9 C)] (P) 97.9 F (36.6 C) (09/09 0059) Pulse Rate:  [77-92] (P) 77 (09/09 0059) Resp:  [16-20] (P) 16 (09/09 0059) BP: (118-159)/(74-89) (P) 134/87 (09/09 0059) SpO2:  [98 %-100 %] (P) 99 % (09/09 0059) Physical Exam Vitals reviewed.  Cardiovascular:     Rate and Rhythm: Normal rate and regular rhythm.     Heart sounds: No murmur heard. Pulmonary:     Effort: Pulmonary effort is normal.     Breath sounds: Normal breath sounds.  Abdominal:     General: Bowel sounds are normal.     Palpations: Abdomen is soft.     Tenderness: There is no abdominal tenderness.  Neurological:     Mental Status: He is alert.     Laboratory: Recent Labs  Lab 05/24/21 0337  WBC 8.9  HGB 14.3  HCT 43.9  PLT 339   Recent Labs  Lab 05/26/21 0847 05/27/21 0239 05/28/21 0339  NA 136 135 138  K 4.4 4.3 3.9  CL 103 105 105  CO2 22  21* 24  BUN '18 16 9  '$ CREATININE 1.30* 1.24 1.00  CALCIUM 9.3 8.8* 8.9  GLUCOSE 99 106* 88    Imaging/Diagnostic Tests: BMP as above  Corky Sox PGY-1, Psychiatry FPTS Intern pager: [phone], text pages welcome

## 2021-05-28 NOTE — Plan of Care (Signed)

## 2021-05-29 DIAGNOSIS — R4182 Altered mental status, unspecified: Secondary | ICD-10-CM | POA: Diagnosis not present

## 2021-05-29 LAB — BASIC METABOLIC PANEL
Anion gap: 10 (ref 5–15)
BUN: 7 mg/dL — ABNORMAL LOW (ref 8–23)
CO2: 24 mmol/L (ref 22–32)
Calcium: 8.7 mg/dL — ABNORMAL LOW (ref 8.9–10.3)
Chloride: 107 mmol/L (ref 98–111)
Creatinine, Ser: 0.97 mg/dL (ref 0.61–1.24)
GFR, Estimated: >60 mL/min (ref >60)
Glucose, Bld: 88 mg/dL (ref 70–99)
Potassium: 3.8 mmol/L (ref 3.5–5.1)
Sodium: 141 mmol/L (ref 135–145)

## 2021-05-29 NOTE — Progress Notes (Signed)
Family Medicine Teaching Service Daily Progress Note Intern Pager: [phone]  Patient name: Charles Fox Medical record number: [id number] Date of birth: Aug 11, 1951 Age: 70 y.o. Gender: male  Primary Care Provider: [name], MD Consultants: None Code Status: FULL  [name]:  8/19: Admitted to [facility] for altered mental status and hematuria 8/20: Patient with continued hematuria with normal brain MRI and improved mentation  8/21: [facility] 12/30, determined to not have capacity 8/22: Discussion with son revealed patient does not have a safe place for discharge 8/23: PSA elevated.  Urology recommending keeping catheter and rechecking PSA in 1 month. 8/24: Voiding trial 8/25: Suicide precautions, trial failed, Foley reinserted 8/26: Suicide precautions removed 8/29 agitated and combative, delirium precautions in place 9/5: AKI 9/9: AKI resolved  Assessment and Plan:  Patient is a 70 year old male admitted for altered mental status and hematuria, concern for bladder malignancy, with resolved AKI.  Past medical history notable for type 2 diabetes.  AKI (resolved) Patient with baseline creatinine of 0.8-0.9.  Creatinine from 1.39 down to 0.97.  Likely prerenal given no hydronephrosis on renal ultrasound and a FeNa of 0.1%.  Urine output over the past 24 hours is 1.4 mL/kg/h, possibly post ATN diuresis. -Foley in - Continue losartan 50 mg - Continue to monitor with QOD BMPs (next BMP 9/12 AM)  Urinary retention and hematuria Patient presented with urinary obstruction, requiring a Foley.  Told to follow-up with urology.  Readmitted with altered mental status and hematuria.  Hematuria thought to be due to traumatic Foley insertion.  CT abdomen pelvis concerning for bladder malignancy.  Continued microscopic hematuria on UA.  Patient with urinary retention 2 days ago, bladder scan of 700 mL.  Foley replaced and it was found that there was a blood clot on the line. -  Foley in place - Continue to monitor for hematuria, none evident today - Flomax 0.4 mg daily  Altered mental status secondary to dementia, agitation Outpatient diagnosis of dementia.  Mental status at baseline is alert and oriented to person and place.  Unable to state location this morning.  On previous assessment patient has been noted to lack capacity.  Clinical social worker currently working with the son, as son has agreed to make medical decisions and signed patient to SNF. Less oriented today than previous days. -- CTM mental status - Continue Seroquel 100 mg nightly - TOC to assist with placement - Neuro referral outpatient for dementia - PT as appropriate  T2DM - Continue metformin 500 mg twice daily  Tobacco use disorder - Nicotine gum   FEN/GI: Carb modified, heart healthy PPx: Lovenox Dispo:pending guardianship and placement, OT recommending SNF  Subjective:  On interview this morning patient reports having a bowel movement in bed. He is alert and oriented only to name. We will continue to monitor his mental status. Review of systems negative for chest pain and shortness of breath.   Objective: Temp:  [97.7 F (36.5 C)-97.8 F (36.6 C)] 97.8 F (36.6 C) (09/09 1203) Pulse Rate:  [70-75] 75 (09/09 1203) Resp:  [16-18] 16 (09/09 1203) BP: (133-145)/(80-86) 133/80 (09/09 1203) SpO2:  [99 %] 99 % (09/09 1203) Physical Exam Vitals reviewed.  Cardiovascular:     Rate and Rhythm: Normal rate and regular rhythm.     Pulses: Normal pulses.     Heart sounds: No murmur heard. Pulmonary:     Effort: Pulmonary effort is normal.     Breath sounds: Normal breath sounds.  Abdominal:     General: Bowel sounds are normal.     Palpations: Abdomen is soft.     Tenderness: There is no abdominal tenderness.  Neurological:     Mental Status: He is alert.     Laboratory: Recent Labs  Lab 05/24/21 0337  WBC 8.9  HGB 14.3  HCT 43.9  PLT 339   Recent Labs  Lab  05/27/21 0239 05/28/21 0339 05/29/21 0057  NA 135 138 141  K 4.3 3.9 3.8  CL 105 105 107  CO2 21* 24 24  BUN 16 9 7*  CREATININE 1.24 1.00 0.97  CALCIUM 8.8* 8.9 8.7*  GLUCOSE 106* 88 88     Imaging/Diagnostic Tests: BMP as above  Corky Sox PGY-1, Psychiatry FPTS Intern pager: [phone], text pages welcome

## 2021-05-29 NOTE — Plan of Care (Signed)
  Problem: Education: Goal: Knowledge of General Education information will improve Description Including pain rating scale, medication(s)/side effects and non-pharmacologic comfort measures Outcome: Progressing   

## 2021-05-30 DIAGNOSIS — R4182 Altered mental status, unspecified: Secondary | ICD-10-CM | POA: Diagnosis not present

## 2021-05-30 NOTE — Progress Notes (Signed)
FPTS Brief Progress Note  S: Patient seen at bedside for evening rounds. Primary RN at bedside- no concerns voiced at this time. Adequate UOP so far this shift.    O: BP (!) 141/76 (BP Location: Left Arm)   Pulse 79   Temp 97.7 F (36.5 C) (Oral)   Resp 16   Ht '5\' 8"'$  (1.727 m)   Wt 104 kg   SpO2 100%   BMI 34.86 kg/m   Gen: alert, no acute distress Resp: normal work of breathing  A/P: Stable. -No changes to current care -Plan per day team -Orders reviewed. Labs for AM ordered, which was adjusted as needed.    Alcus Dad, MD 05/30/2021, 12:56 AM PGY-2, [place] [facility] Night Resident  Please page [phone] with questions.

## 2021-05-30 NOTE — Progress Notes (Signed)
Family Medicine Teaching Service Daily Progress Note Intern Pager: [phone]  Patient name: Charles Fox Medical record number: [id number] Date of birth: 1951/06/19 Age: 70 y.o. Gender: male  Primary Care Provider: [name], MD Consultants: psychiatry (signed off) Code Status: FULL  [name]:  8/19: Admitted for altered mental status and hematuria 8/20: Normal brain MRI and improved mentation  8/21: [facility] 12/30, determined to not have capacity 8/22: Discussion with son revealed patient does not have a safe place for discharge 8/23: PSA elevated.  Urology recommending keeping catheter and rechecking PSA in 1 month. 8/24: Voiding trial 8/25: Suicide precautions, voiding trial failed, Foley reinserted 8/26: Suicide precautions removed 8/29 agitated and combative, delirium precautions in place 9/5: AKI 9/9: AKI resolved  Assessment and Plan:  Charles Fox is a 70 y.o. male admitted for altered mental status and hematuria with concern for bladder cancer. PMH significant for HTN, T2DM, HLD, tobacco use disorder, and homelessness.  Possible Bladder Malignancy  Urinary Retention (resolved) Foley remains in place. Appropriate UOP overnight without gross hematuria. -Flomax 0.'4mg'$  daily -Monitor for hematuria -Outpatient urology follow up  Dementia  Intermittent Agitation Patient calm and cooperative without agitation over past few days/nights. Does not have capacity secondary to dementia. Son is current Media planner. -Continue Seroquel '100mg'$  nightly -Appreciate TOC assistance with SNF placement  Remainder of problems (HTN, T2DM, HLD) chronic and stable.  FEN/GI: heart healthy/carb modified  PPx: Lovenox Dispo:SNF in 3 or more days. Barriers include pending guardianship, no current bed offers, difficult placement.   Subjective:  No acute events overnight. Patient denies complaints this morning.  Objective: Temp:  [97.6 F (36.4 C)-98.4 F  (36.9 C)] 97.6 F (36.4 C) (09/11 0358) Pulse Rate:  [69-100] 100 (09/11 0358) Resp:  [15-18] 15 (09/11 0358) BP: (119-154)/(64-85) 154/85 (09/11 0358) SpO2:  [96 %-100 %] 100 % (09/11 0358) Physical Exam: General: alert, resting comfortably, NAD Cardiovascular: RRR, normal S1/S2 without m/r/g Respiratory: normal effort, lungs CTAB Abdomen: soft, nontender Neuro: alert, 5/5 strength in all extremities, sensation intact to light touch throughout. Oriented to self and place  Laboratory: Recent Labs  Lab 05/24/21 0337  WBC 8.9  HGB 14.3  HCT 43.9  PLT 339   Recent Labs  Lab 05/27/21 0239 05/28/21 0339 05/29/21 0057  NA 135 138 141  K 4.3 3.9 3.8  CL 105 105 107  CO2 21* 24 24  BUN 16 9 7*  CREATININE 1.24 1.00 0.97  CALCIUM 8.8* 8.9 8.7*  GLUCOSE 106* 88 88     Imaging/Diagnostic Tests: No new imaging results  Alcus Dad, MD 05/30/2021, 6:49 AM PGY-2, [place] Intern pager: [phone], text pages welcome

## 2021-05-31 DIAGNOSIS — R4182 Altered mental status, unspecified: Secondary | ICD-10-CM | POA: Diagnosis not present

## 2021-05-31 LAB — CBC
HCT: 44.5 % (ref 39.0–52.0)
Hemoglobin: 14.4 g/dL (ref 13.0–17.0)
MCH: 28.9 pg (ref 26.0–34.0)
MCHC: 32.4 g/dL (ref 30.0–36.0)
MCV: 89.4 fL (ref 80.0–100.0)
Platelets: 284 10*3/uL (ref 150–400)
RBC: 4.98 MIL/uL (ref 4.22–5.81)
RDW: 13.7 % (ref 11.5–15.5)
WBC: 7.7 10*3/uL (ref 4.0–10.5)
nRBC: 0 % (ref 0.0–0.2)

## 2021-05-31 LAB — BASIC METABOLIC PANEL
Anion gap: 10 (ref 5–15)
BUN: 9 mg/dL (ref 8–23)
CO2: 23 mmol/L (ref 22–32)
Calcium: 8.7 mg/dL — ABNORMAL LOW (ref 8.9–10.3)
Chloride: 105 mmol/L (ref 98–111)
Creatinine, Ser: 1.1 mg/dL (ref 0.61–1.24)
GFR, Estimated: >60 mL/min (ref >60)
Glucose, Bld: 65 mg/dL — ABNORMAL LOW (ref 70–99)
Potassium: 3.6 mmol/L (ref 3.5–5.1)
Sodium: 138 mmol/L (ref 135–145)

## 2021-05-31 LAB — GLUCOSE, CAPILLARY
Glucose-Capillary: 66 mg/dL — ABNORMAL LOW (ref 70–99)
Glucose-Capillary: 86 mg/dL (ref 70–99)

## 2021-05-31 NOTE — Progress Notes (Signed)
FPTS Interim Progress Note  S: Patient is found sitting on the side of his bed.  He is pleasant, but reports wanting someone to talk to.  He is alert and oriented x1.  He voices no complaints.  O: BP (!) 104/56 (BP Location: Right Arm)   Pulse 75   Temp 98.2 F (36.8 C) (Oral)   Resp 17   Ht '5\' 8"'$  (1.727 m)   Wt 102.4 kg   SpO2 100%   BMI 34.33 kg/m   VSS: one soft BP recently General: elderly male sitting on the side of the bed, NAD Resp: unlabored respirations  A/P: -CTM BP -Awaiting SNF -Remainder per primary team  Corky Sox PGY-1, Psychiatry Service pager [phone]

## 2021-05-31 NOTE — Progress Notes (Signed)
Family Medicine Teaching Service Daily Progress Note Intern Pager: [phone]  Patient name: Charles Fox Medical record number: [id number] Date of birth: [birth year] Age: 70 y.o. Gender: male  Primary Care Provider: [name], MD Consultants: Psychiatry Code Status: Full  [name]:  8/19: Admitted for altered mental status and hematuria 8/20: Normal brain MRI and improved mentation  8/21: [facility] 12/30, determined to not have capacity 8/22: Discussion with son revealed patient does not have a safe place for discharge 8/23: PSA elevated.  Urology recommending keeping catheter and rechecking PSA in 1 month. 8/24: Voiding trial 8/25: Suicide precautions, voiding trial failed, Foley reinserted 8/26: Suicide precautions removed 8/29 agitated and combative, delirium precautions in place 9/5: AKI 9/9: AKI resolved  Assessment and Plan:  SAMARION COXWELL is a 70 y.o. male admitted for altered mental status and hematuria with concern for bladder cancer. PMH significant for HTN, T2DM, HLD, tobacco use disorder, and homelessness.  Possible Bladder Malignancy  Urinary Retention (resolved) -Flomax 0.'4mg'$  daily -Monitor for hematuria -Outpatient radiology follow up  Dementia  Intermittent Agitation -Continue Seroquel '100mg'$  nightly -Appreciate TOC assistance with SNF placement  FEN/GI: heart healthy/carb modified  PPx: Lovenox Dispo:SNF in 2-3 days. Barriers include guardianship, no bed offers, foley catheter.   Subjective:  No acute events overnight, patient was sleeping in bed this morning, and is still unaware as to why he is in the hospital or has a catheter.   Objective: Temp:  [98 F (36.7 C)-98.6 F (37 C)] 98.6 F (37 C) (09/12 0327) Pulse Rate:  [68-85] 76 (09/12 0327) Resp:  [15-18] 17 (09/12 0327) BP: (110-160)/(58-83) 129/58 (09/12 0327) SpO2:  [94 %-100 %] 98 % (09/12 0327) Weight:  [102.4 kg] 102.4 kg (09/12 0327) Physical  Exam: General: Well appearing, alert and oriented to person Cardiovascular: RRR, [place] Respiratory: CTABL, no wheezes or stridor Abdomen: Soft, non-tender, non-distended Extremities: Pulses intact in all extremities GU: Foley catheter in place with healed lesion on glans of penis, near urethra. Catheter freely draining  Laboratory: Recent Labs  Lab 05/31/21 0504  WBC 7.7  HGB 14.4  HCT 44.5  PLT 284   Recent Labs  Lab 05/28/21 0339 05/29/21 0057 05/31/21 0504  NA 138 141 138  K 3.9 3.8 3.6  CL 105 107 105  CO2 '24 24 23  '$ BUN 9 7* 9  CREATININE 1.00 0.97 1.10  CALCIUM 8.9 8.7* 8.7*  GLUCOSE 88 88 65*      Imaging/Diagnostic Tests: None  Holley Bouche, MD 05/31/2021, 8:05 AM PGY-1, [place] Intern pager: [phone], text pages welcome

## 2021-05-31 NOTE — TOC Progression Note (Signed)
Transition of Care [facility]) - Progression Note    Patient Details  Name: Charles Fox MRN: [id number] Date of Birth: [birth year]  Transition of Care [facility]) CM/SW Contact  Curlene Labrum, RN Phone Number: 05/31/2021, 10:10 AM  Clinical Narrative:    Case management spoke with the patient this morning and he is aware that he parked his car in the family medicine parking lot prior to his admission to the hospital.  I called and spoke with the patient's son on the phone and he plans to visit with his father at the bedside today after work and will take the patient's truck home.  The truck keys are present in the hospital room.   The patient's son was agreeable to make medical decisions for the patient since he has memory issues.  The patient's son is agreeable to assist with decision making for SNF placement once the bed offers are available.  The son and significant other are currently staying with friends at this time.  CM and MSW with DTP Team will continue to follow the patient for SNF placement.   Expected Discharge Plan: Skilled Nursing Facility Barriers to Discharge: SNF Pending bed offer  Expected Discharge Plan and Services Expected Discharge Plan: [place] In-house Referral: Clinical Social Work, PCP / Psychologist, educational Discharge Planning Services: CM Consult Post Acute Care Choice: [facility] Living arrangements for the past 2 months: Homeless (Son requests that patient discharge to his care after SNF placement.)                                       Social Determinants of Health (SDOH) Interventions    Readmission Risk Interventions No flowsheet data found.

## 2021-05-31 NOTE — Progress Notes (Signed)
Occupational Therapy Treatment Patient Details Name: GURANSH GREELEY MRN: [id number] DOB: 06-07-1951 Today's Date: 05/31/2021   History of present illness 70 yr old male who presents with AMS and hematuria/urinary retention (foley placed 8/15), diagnosis of UTI. PMH includes diabetes, CVA  hypertension, hyperlipidemia, homelessness, tobacco use disorder.   OT comments  Patient seated on eob and stated he wanted to shave. Patient stood at sink for 15+ minutes for grooming and shaving.  Patient continues to be impulsive and have unsafe habits.  Patient agreed to stay in recliner for lunch.  Acute OT to continue to follow.    Recommendations for follow up therapy are one component of a multi-disciplinary discharge planning process, led by the attending physician.  Recommendations may be updated based on patient status, additional functional criteria and insurance authorization.    Follow Up Recommendations  SNF;Supervision/Assistance - 24 hour    Equipment Recommendations       Recommendations for Other Services      Precautions / Restrictions Precautions Precautions: Fall Precaution Comments: foley cath       Mobility Bed Mobility Overal bed mobility: Needs Assistance             General bed mobility comments: seated on eob when therapist arrived    Transfers Overall transfer level: Needs assistance Equipment used: None Transfers: Sit to/from Stand Sit to Stand: Supervision Stand pivot transfers: Supervision            Balance Overall balance assessment: Needs assistance Sitting-balance support: No upper extremity supported;Feet supported Sitting balance-Leahy Scale: Good Sitting balance - Comments: independent with sitting balance   Standing balance support: No upper extremity supported Standing balance-Leahy Scale: Fair Standing balance comment: stood at sink for groomin                           ADL either performed or assessed with clinical  judgement   ADL Overall ADL's : Needs assistance/impaired Eating/Feeding: Modified independent;Sitting   Grooming: Wash/dry hands;Wash/dry face;Supervision/safety;Standing Grooming Details (indicate cue type and reason): grooming standing at sink, including shaving with supervision due to impulsiveness and forgetful of foley                             Functional mobility during ADLs: Supervision/safety General ADL Comments: Patient stood at sink and transferred to recliner without an assistive device     Vision       Perception     Praxis      Cognition Arousal/Alertness: Awake/alert Behavior During Therapy: Flat affect Overall Cognitive Status: Impaired/Different from baseline Area of Impairment: Safety/judgement;Memory                 Orientation Level: Place Current Attention Level: Selective Memory: Decreased short-term memory   Safety/Judgement: Decreased awareness of safety;Decreased awareness of deficits Awareness: Intellectual Problem Solving: Requires verbal cues;Slow processing General Comments: often asked therapist to take him out of facility        Exercises     Shoulder Instructions       General Comments      Pertinent Vitals/ Pain       Pain Assessment: Faces Faces Pain Scale: Hurts a little bit Pain Location: lower back Pain Descriptors / Indicators: Grimacing;Aching Pain Intervention(s): Monitored during session;Repositioned  Home Living  Prior Functioning/Environment              Frequency  Min 2X/week        Progress Toward Goals  OT Goals(current goals can now be found in the care plan section)  Progress towards OT goals: Progressing toward goals  Acute Rehab OT Goals Patient Stated Goal: to go home OT Goal Formulation: With patient Time For Goal Achievement: 05/24/21 Potential to Achieve Goals: Fair ADL Goals Pt Will Perform Grooming: with  modified independence;standing Pt Will Perform Upper Body Bathing: with modified independence;sitting Pt Will Perform Lower Body Bathing: with modified independence;sit to/from stand Pt Will Perform Upper Body Dressing: with modified independence;sitting Pt Will Perform Lower Body Dressing: with modified independence;sit to/from stand Pt Will Transfer to Toilet: with modified independence;ambulating Pt Will Perform Toileting - Clothing Manipulation and hygiene: with modified independence;sit to/from stand  Plan Discharge plan remains appropriate    Co-evaluation                 AM-PAC OT "6 Clicks" Daily Activity     Outcome Measure   Help from another person eating meals?: None Help from another person taking care of personal grooming?: A Little Help from another person toileting, which includes using toliet, bedpan, or urinal?: A Little Help from another person bathing (including washing, rinsing, drying)?: A Little Help from another person to put on and taking off regular upper body clothing?: A Little Help from another person to put on and taking off regular lower body clothing?: A Little 6 Click Score: 19    End of Session    OT Visit Diagnosis: Unsteadiness on feet (R26.81)   Activity Tolerance Patient tolerated treatment well   Patient Left in chair;with call bell/phone within reach;with chair alarm set   Nurse Communication Mobility status        Time: [id number] OT Time Calculation (min): 25 min  Charges: OT General Charges $OT Visit: 1 Visit OT Treatments $Self Care/Home Management : 23-37 mins  Lodema Hong, OTA   Makenize Messman [name] 05/31/2021, 1:19 PM

## 2021-05-31 NOTE — Progress Notes (Signed)
PT Cancellation Note  Patient Details Name: Charles Fox MRN: [id number] DOB: 1951/07/10   Cancelled Treatment:    Reason Eval/Treat Not Completed: Medical issues which prohibited therapy. RN reports pt with blood sugar of 65 and recommending hold on ambulation. PT to re-attempt as time allows.   Lorriane Shire 05/31/2021, 10:14 AM  Lorrin Goodell, PT  Office # [phone] Pager [phone]

## 2021-06-01 DIAGNOSIS — R4182 Altered mental status, unspecified: Secondary | ICD-10-CM | POA: Diagnosis not present

## 2021-06-01 LAB — SARS CORONAVIRUS 2 (TAT 6-24 HRS): SARS Coronavirus 2: NEGATIVE

## 2021-06-01 NOTE — Progress Notes (Signed)
Pt had been tugging on his foley catheter this shift and asking for it to be taken out. It was also mentioned in shift report this am that pt had been pulling on his catheter. Family teaching residency paged.

## 2021-06-01 NOTE — Progress Notes (Signed)
Given to verbal order to reinsert foley catheter.

## 2021-06-01 NOTE — Progress Notes (Signed)
FPTS Interim Progress Note  S: Received page regarding patient removing his foley catheter and frank blood noted, went to assess patient with Dr. Marcina Millard and acting intern Leslie Dales. Patient standing up at the sink and was asked to lay down on the bed for an evaluation. Patient denies any pain or concerns. States that he pulled it out of his nose because he gets frequent nose bleeds.   O: BP 111/70 (BP Location: Right Arm)   Pulse 95   Temp 98.1 F (36.7 C) (Oral)   Resp 20   Ht '5\' 8"'$  (1.727 m)   Wt 102.4 kg   SpO2 96%   BMI 34.33 kg/m   General: Patient well-appearing.  Resp: normal work of breathing noted on room air  Derm: no new rashes or lesions noted GU: no scrotal or penile edema noted, no frank blood noted, picture below Neuro: normal gait Psych: mood appropriate, no agitation noted, nonsensical speech at times  Exam performed in the presence of team and chaperone Investment banker, corporate).   A/P: -will plan to reinsert foley -plan for SNF tomorrow -conveyed plan to nurse -continue remainder of plan already in place         [place], Lovette Cliche, DO 06/01/2021, 1:27 PM PGY-2, [place] Medicine Service pager [phone]

## 2021-06-01 NOTE — Progress Notes (Addendum)
FPTS Interim Progress Note  S: Earlier this day patient removed his Foley on his own.  Foley replaced.  Patient denies any pain.  Urine output of 1 mL/kg/h.  No gross hematuria noted.  Alert and oriented to place.  O: BP (!) 142/78 (BP Location: Left Arm)   Pulse 81   Temp 97.7 F (36.5 C) (Oral)   Resp 16   Ht '5\' 8"'$  (1.727 m)   Wt 225 lb 12 oz (102.4 kg)   SpO2 97%   BMI 34.33 kg/m   VSS General: elderly male lying in bed CV: RRR, NMRG, strong peripheral pulses Resp: unlabored respirations, lungs CTAB  A/P: Patient awaiting SNF placement tomorrow. No changes at this time -Remainder per primary team  Corky Sox PGY-1, Psychiatry Service pager [phone]

## 2021-06-01 NOTE — Progress Notes (Addendum)
CSW spoke with admissions at [facility] who states insurance authorization has been initiated and the facility can accept the patient tomorrow.  CSW will notify patient's son of information.  [name], MSW, LCSW Transitions of Care  Clinical Social Worker II [phone]

## 2021-06-01 NOTE — Progress Notes (Signed)
Family Medicine Teaching Service Daily Progress Note Intern Pager: [phone]  Patient name: Charles Fox Medical record number: [id number] Date of birth: 05-26-1951 Age: 70 y.o. Gender: male  Primary Care Provider: [name], MD Consultants: Psychiatry Code Status: Full  [name]:  8/19: Admitted for altered mental status and hematuria 8/20: Normal brain MRI and improved mentation  8/21: [facility] 12/30, determined to not have capacity 8/22: Discussion with son revealed patient does not have a safe place for discharge 8/23: PSA elevated.  Urology recommending keeping catheter and rechecking PSA in 1 month. 8/24: Voiding trial 8/25: Suicide precautions, voiding trial failed, Foley reinserted 8/26: Suicide precautions removed 8/29 agitated and combative, delirium precautions in place 9/5: AKI 9/9: AKI resolved  Assessment and Plan:  Charles Fox is a 70 y.o. male admitted for altered mental status and hematuria with concern for bladder cancer. PMH significant for HTN, T2DM, HLD, tobacco use disorder, and homelessness.  Possible Bladder Malignancy  Urinary Retention (resolved) Patient resting comfortably in bed, foley catheter bag emptied, foley intact -Flomax 0.'4mg'$  daily -Monitor for hematuria -Outpatient radiology follow up   Dementia  Intermittent Agitation -Continue Seroquel '100mg'$  nightly -Appreciate TOC assistance with SNF placement   FEN/GI:  heart healthy/carb modified  PPx: Lovenox Dispo:SNF tomorrow. Barriers include guardianship, foley catheter.   Subjective:  Patient notes they had a difficult night, with some malaise but it improved overnight. Patient feeling well this morning, last bowel movement was 3 days ago. Foley catheter bag emptied. Patient still unsure as to why they are in hospital and lacks decision making capacity.  Objective: Temp:  [97.8 F (36.6 C)-98.4 F (36.9 C)] 98 F (36.7 C) (09/13 0412) Pulse Rate:   [70-76] 72 (09/13 0412) Resp:  [16-18] 16 (09/13 0412) BP: (104-160)/(56-85) 138/60 (09/13 0412) SpO2:  [94 %-100 %] 99 % (09/13 0412) Physical Exam: General: Well appearing, alert and oriented to person and place.  Cardiovascular: RRR, [facility] Respiratory: CTABL, no wheezes or stridor Abdomen: Soft, non-tender, non-distended Extremities: Pulses intact in all extremities GU: Foley catheter present and secured to leg, Charles Fox, pharm student for chaperone  Laboratory: Recent Labs  Lab 05/31/21 0504  WBC 7.7  HGB 14.4  HCT 44.5  PLT 284   Recent Labs  Lab 05/28/21 0339 05/29/21 0057 05/31/21 0504  NA 138 141 138  K 3.9 3.8 3.6  CL 105 107 105  CO2 '24 24 23  '$ BUN 9 7* 9  CREATININE 1.00 0.97 1.10  CALCIUM 8.9 8.7* 8.7*  GLUCOSE 88 88 65*    Imaging/Diagnostic Tests: None  Holley Bouche, MD 06/01/2021, 6:58 AM PGY-1, [place] Intern pager: [phone], text pages welcome

## 2021-06-01 NOTE — Progress Notes (Signed)
Physical Therapy Treatment Patient Details Name: Charles Fox MRN: [id number] DOB: 01-31-1951 Today's Date: 06/01/2021   History of Present Illness Pt 70 yr old male who presented on 05/07/21 with AMS and hematuria/urinary retention (foley placed 8/15), diagnosis of UTI. Noted per MD notes dementia with intermittent agitation. PMH includes diabetes, CVA  hypertension, hyperlipidemia, homelessness, tobacco use disorder.    PT Comments    Treatment limited due to pt in pain from needing urinate and unable - nurse techs in for foley catheter at end of session.  Pt requiring cues for safety and assist for balance when standing.  He remains unsafe to live alone - current plan is for SNF , but pt also will likely need long term care due to cognition.     Recommendations for follow up therapy are one component of a multi-disciplinary discharge planning process, led by the attending physician.  Recommendations may be updated based on patient status, additional functional criteria and insurance authorization.  Follow Up Recommendations  SNF;Supervision/Assistance - 24 hour (has medical needs for SNF; likely transition to some form of long term care due to cognition)     Equipment Recommendations  Other (comment) (rollator)    Recommendations for Other Services       Precautions / Restrictions Precautions Precautions: Fall Precaution Comments: foley cath     Mobility  Bed Mobility Overal bed mobility: Needs Assistance Bed Mobility: Supine to Sit;Sit to Supine     Supine to sit: Supervision Sit to supine: Supervision        Transfers Overall transfer level: Needs assistance Equipment used: None Transfers: Sit to/from Stand Sit to Stand: Supervision         General transfer comment: Performed multiple times during session with supervision for safety and safety cues  Ambulation/Gait Ambulation/Gait assistance: Min guard Gait Distance (Feet): 60 Feet Assistive device:  4-wheeled walker Gait Pattern/deviations: Step-through pattern;Trunk flexed;Drifts right/left     General Gait Details: Cues for safety with min guard to stabilize; limited distance due to pain (feels that he needs to urniate and can't)   Stairs             Wheelchair Mobility    Modified Rankin (Stroke Patients Only)       Balance Overall balance assessment: Needs assistance Sitting-balance support: No upper extremity supported;Feet supported Sitting balance-Leahy Scale: Good Sitting balance - Comments: independent with sitting balance   Standing balance support: No upper extremity supported;Bilateral upper extremity supported Standing balance-Leahy Scale: Fair Standing balance comment: Could stand without UE support but needing at least single UE support for mobility                            Cognition Arousal/Alertness: Awake/alert Behavior During Therapy: WFL for tasks assessed/performed Overall Cognitive Status: Impaired/Different from baseline Area of Impairment: Safety/judgement;Memory;Problem solving                   Current Attention Level: Selective Memory: Decreased short-term memory   Safety/Judgement: Decreased awareness of safety;Decreased awareness of deficits Awareness: Intellectual          Exercises      General Comments General comments (skin integrity, edema, etc.): At arrival pt up in bathroom naked (nurse tech was unaware pt up).  Assisted pt back to bed and he laid down. Nurse tech came in to replace foley but pt agreed to ambulate first.  After resting, he agreed to ambulate. Ambulated short distance  and reports severe pain from needing to urinate and can't.  Pt had pulled out his foley earlier. Returned to room and called for techs to come back for foley.      Pertinent Vitals/Pain Pain Assessment: Faces Faces Pain Scale: Hurts even more Pain Location: bladder (pt pulled out catheter earlier, now unable to urinate  and in pain, NTs in to replace foley) Pain Descriptors / Indicators: Pressure Pain Intervention(s): Limited activity within patient's tolerance;Monitored during session    Home Living                      Prior Function            PT Goals (current goals can now be found in the care plan section) Acute Rehab PT Goals Patient Stated Goal: to go home PT Goal Formulation: Patient unable to participate in goal setting Time For Goal Achievement: 06/15/21 Potential to Achieve Goals: Fair Progress towards PT goals: Progressing toward goals    Frequency    Min 2X/week      PT Plan Current plan remains appropriate    Co-evaluation              AM-PAC PT "6 Clicks" Mobility   Outcome Measure  Help needed turning from your back to your side while in a flat bed without using bedrails?: A Little Help needed moving from lying on your back to sitting on the side of a flat bed without using bedrails?: A Little Help needed moving to and from a bed to a chair (including a wheelchair)?: A Little Help needed standing up from a chair using your arms (e.g., wheelchair or bedside chair)?: A Little Help needed to walk in hospital room?: A Little Help needed climbing 3-5 steps with a railing? : A Little 6 Click Score: 18    End of Session Equipment Utilized During Treatment: Gait belt Activity Tolerance: Patient tolerated treatment well Patient left: in bed;with nursing/sitter in room (Told nurse tech alarm not on as they were still working with pt and going to replace foley) Nurse Communication: Mobility status PT Visit Diagnosis: Unsteadiness on feet (R26.81);Other abnormalities of gait and mobility (R26.89);Difficulty in walking, not elsewhere classified (R26.2)     Time: [id number] PT Time Calculation (min) (ACUTE ONLY): 12 min  Charges:  $Gait Training: 8-22 mins                     Charles Fox, PT Acute Rehab Services Pager [phone] [facility]  [place] 06/01/2021, 1:59 PM

## 2021-06-01 NOTE — Progress Notes (Signed)
Spoke with MD with family medicine residency, stated that he'd come with team to assess pt at bedside.

## 2021-06-02 DIAGNOSIS — R4182 Altered mental status, unspecified: Secondary | ICD-10-CM | POA: Diagnosis not present

## 2021-06-02 NOTE — Progress Notes (Addendum)
12:30pm: CSW unable to initiate insurance authorization via the [facility] portal so clinicals were faxed to [facility] directly.  CSW provided Dr. Marcina Millard with an update regarding potential barriers to SNF placement.  12pm: CSW spoke with Lavella Lemons at [facility] who states she will send patient's information to corporate for review.  CSW attempted to reach patient's son via phone without success - a text message was sent requesting a return call also.  10:25am: CSW was notified by admissions at [facility] that the facility will not have a bed for the patient this week.  CSW spoke with Lavella Lemons at [facility] to request a review of the patient.  [name], MSW, LCSW Transitions of Care  Clinical Social Worker II [phone]

## 2021-06-02 NOTE — Progress Notes (Signed)
Occupational Therapy Treatment Patient Details Name: Charles Fox MRN: [id number] DOB: 08/09/1951 Today's Date: 06/02/2021   History of present illness Pt 70 yr old male who presented on 05/07/21 with AMS and hematuria/urinary retention (foley placed 8/15), diagnosis of UTI. Noted per MD notes dementia with intermittent agitation. PMH includes diabetes, CVA  hypertension, hyperlipidemia, homelessness, tobacco use disorder.   OT comments  Pt received in bed, agreeable to OT session. Pt pleasantly confused, asking "where is my father". Pt with flat affect throughout today's session. Pt agreeable to walk in the room, stood at sink but declining several grooming task suggestions. Pt continues to require increased assistance with ADL/IADL and functional mobility secondary to instability, impulsivity and cognitive limitations, and generalized deconditioning. Pt will continue to benefit from skilled OT services to maximize safety and independence with ADL/IADL and functional mobility. Will continue to follow acutely and progress as tolerated.     Recommendations for follow up therapy are one component of a multi-disciplinary discharge planning process, led by the attending physician.  Recommendations may be updated based on patient status, additional functional criteria and insurance authorization.    Follow Up Recommendations  SNF;Supervision/Assistance - 24 hour    Equipment Recommendations   (pt. is currently homeless)    Recommendations for Other Services      Precautions / Restrictions Precautions Precautions: Fall Precaution Comments: foley cath Restrictions Weight Bearing Restrictions: No       Mobility Bed Mobility Overal bed mobility: Needs Assistance Bed Mobility: Supine to Sit;Sit to Supine     Supine to sit: Supervision Sit to supine: Supervision   General bed mobility comments: supervision for safety    Transfers Overall transfer level: Needs assistance Equipment  used: None Transfers: Sit to/from Stand Sit to Stand: Min guard         General transfer comment: minguard for safety and management of foley    Balance Overall balance assessment: Needs assistance Sitting-balance support: No upper extremity supported;Feet supported Sitting balance-Leahy Scale: Good Sitting balance - Comments: independent with sitting balance   Standing balance support: No upper extremity supported;Bilateral upper extremity supported Standing balance-Leahy Scale: Fair Standing balance comment: reliant on at least single UE support, improved stability with BUE                           ADL either performed or assessed with clinical judgement   ADL Overall ADL's : Needs assistance/impaired Eating/Feeding: Modified independent;Sitting   Grooming: Min guard;Standing Grooming Details (indicate cue type and reason): pt impulsive and forgetful of foley             Lower Body Dressing: Minimal assistance;Sit to/from stand Lower Body Dressing Details (indicate cue type and reason): assist to access feet Toilet Transfer: Min guard;Ambulation Toilet Transfer Details (indicate cue type and reason): minguard for safety Toileting- Clothing Manipulation and Hygiene: Min guard;Sit to/from stand       Functional mobility during ADLs: Min guard General ADL Comments: Pt completed grooming while standing at sink level; pt limited due to self limiting behaviors declined completion of several grooming tasks while standing     Vision   Vision Assessment?: No apparent visual deficits   Perception     Praxis      Cognition Arousal/Alertness: Awake/alert Behavior During Therapy: Flat affect Overall Cognitive Status: No family/caregiver present to determine baseline cognitive functioning  General Comments: pt with hx of dementia at baseline, pt oriented to self and place. Pt asking "where is my father". Pt  frequently stating "I am ready to leave"        Exercises     Shoulder Instructions       General Comments VSS    Pertinent Vitals/ Pain       Pain Assessment: No/denies pain  Home Living                                          Prior Functioning/Environment              Frequency  Min 2X/week        Progress Toward Goals  OT Goals(current goals can now be found in the care plan section)  Progress towards OT goals: Progressing toward goals  Acute Rehab OT Goals Patient Stated Goal: to go home OT Goal Formulation: With patient Time For Goal Achievement: 05/24/21 Potential to Achieve Goals: Fair ADL Goals Pt Will Perform Grooming: with modified independence;standing Pt Will Perform Upper Body Bathing: with modified independence;sitting Pt Will Perform Lower Body Bathing: with modified independence;sit to/from stand Pt Will Perform Upper Body Dressing: with modified independence;sitting Pt Will Perform Lower Body Dressing: with modified independence;sit to/from stand Pt Will Transfer to Toilet: with modified independence;ambulating Pt Will Perform Toileting - Clothing Manipulation and hygiene: with modified independence;sit to/from stand  Plan Discharge plan remains appropriate    Co-evaluation                 AM-PAC OT "6 Clicks" Daily Activity     Outcome Measure   Help from another person eating meals?: None Help from another person taking care of personal grooming?: A Little Help from another person toileting, which includes using toliet, bedpan, or urinal?: A Little Help from another person bathing (including washing, rinsing, drying)?: A Little Help from another person to put on and taking off regular upper body clothing?: A Little Help from another person to put on and taking off regular lower body clothing?: A Little 6 Click Score: 19    End of Session Equipment Utilized During Treatment: Gait belt;Rolling walker  OT  Visit Diagnosis: Unsteadiness on feet (R26.81)   Activity Tolerance Patient tolerated treatment well   Patient Left with call bell/phone within reach;in bed;with bed alarm set   Nurse Communication Mobility status        Time: 1019-1030 OT Time Calculation (min): 11 min  Charges: OT General Charges $OT Visit: 1 Visit OT Treatments $Self Care/Home Management : 8-22 mins  Helene Kelp OTR/L Acute Rehabilitation Services Office: [phone]   Wyn Forster 06/02/2021, 10:58 AM

## 2021-06-02 NOTE — Progress Notes (Signed)
Family Medicine Teaching Service Daily Progress Note Intern Pager: [phone]  Patient name: Charles Fox Medical record number: [id number] Date of birth: [birth year] Age: 70 y.o. Gender: male  Primary Care Provider: [name], MD Consultants: Psychiatry Code Status: Full  [name]:  8/19: Admitted for altered mental status and hematuria 8/20: Normal brain MRI and improved mentation  8/21: [facility] 12/30, determined to not have capacity 8/22: Discussion with son revealed patient does not have a safe place for discharge 8/23: PSA elevated.  Urology recommending keeping catheter and rechecking PSA in 1 month. 8/24: Voiding trial 8/25: Suicide precautions, voiding trial failed, Foley reinserted 8/26: Suicide precautions removed 8/29 agitated and combative, delirium precautions in place 9/5: AKI 9/9: AKI resolved  Assessment and Plan: Charles Fox is a 70 y.o. male admitted for altered mental status and hematuria with concern for bladder cancer. PMH significant for HTN, T2DM, HLD, tobacco use disorder, and homelessness.  Possible Bladder Malignancy  Urinary Retention (resolved) Patient resting comfortably in bed, no completes of suprapubic pain or discomfort, foley intact -Flomax 0.'4mg'$  daily -Monitor for hematuria -Outpatient radiology follow up -Foley in place until outpatient urology removal at end of month (06/19/21)  Dementia  Intermittent Agitation -Continue Seroquel 100 mg Nightly & Ramelteon 8 mg nightly -Appreciate TOC assistance with SNF -Recommend Neurology outpatient referral for dementia  T2DM -Metformin 500 mg twice daily  Tobacco Use -Nicotine patch 14 mg  FEN/GI: heart healthy/carb modified  PPx: Lovenox Dispo:SNF in 3 or more days. Barriers include None.   Subjective:  Alert and oriented to person and place today, notes that he would like to go home today, informed him that he was going to SNF soon. Patient had some confusion  about interacting with father, but was able to be redirected.   Objective: Temp:  [97.7 F (36.5 C)-98.1 F (36.7 C)] 97.7 F (36.5 C) (09/13 1939) Pulse Rate:  [79-95] 81 (09/13 1939) Resp:  [16-20] 16 (09/13 1939) BP: (94-142)/(49-78) 142/78 (09/13 1939) SpO2:  [95 %-97 %] 97 % (09/13 1939) Physical Exam: General: Well appearing, alert and oriented to person and place, obese Cardiovascular: RRR, [place] Respiratory: CTABL, no wheezes or stridor Abdomen: Soft, non-tender, non-distended Extremities: Pulses intact in all extremities, no lesions or rash GU: Foley catheter in place, draining urine - [employer] student as chaperone  Laboratory: Recent Labs  Lab 05/31/21 0504  WBC 7.7  HGB 14.4  HCT 44.5  PLT 284   Recent Labs  Lab 05/28/21 0339 05/29/21 0057 05/31/21 0504  NA 138 141 138  K 3.9 3.8 3.6  CL 105 107 105  CO2 '24 24 23  '$ BUN 9 7* 9  CREATININE 1.00 0.97 1.10  CALCIUM 8.9 8.7* 8.7*  GLUCOSE 88 88 65*     Imaging/Diagnostic Tests: None  Holley Bouche, MD 06/02/2021, 7:23 AM PGY-1, [place] Intern pager: [phone], text pages welcome

## 2021-06-03 ENCOUNTER — Other Ambulatory Visit (HOSPITAL_COMMUNITY): Payer: Self-pay

## 2021-06-03 DIAGNOSIS — N179 Acute kidney failure, unspecified: Secondary | ICD-10-CM | POA: Diagnosis not present

## 2021-06-03 DIAGNOSIS — R531 Weakness: Secondary | ICD-10-CM | POA: Diagnosis not present

## 2021-06-03 DIAGNOSIS — N320 Bladder-neck obstruction: Secondary | ICD-10-CM | POA: Diagnosis not present

## 2021-06-03 DIAGNOSIS — F0390 Unspecified dementia without behavioral disturbance: Secondary | ICD-10-CM | POA: Diagnosis not present

## 2021-06-03 DIAGNOSIS — R4182 Altered mental status, unspecified: Secondary | ICD-10-CM | POA: Diagnosis not present

## 2021-06-03 NOTE — Progress Notes (Signed)
FPTS Interim Progress Note  S: Went to see patient early in shift. Sleeping, no acute distress.  O: BP 127/70 (BP Location: Right Arm)   Pulse 81   Temp 98.2 F (36.8 C) (Oral)   Resp 18   Ht '5\' 8"'$  (1.727 m)   Wt 102.4 kg   SpO2 99%   BMI 34.33 kg/m   General: Patient asleep and in no acute distress.  A/P: No concerns, remainder per day team progress note.  Lurline Del, DO 06/03/2021, 10:04 PM PGY-3, [place] Medicine Service pager [phone]

## 2021-06-03 NOTE — TOC Progression Note (Addendum)
Transition of Care [facility]) - Progression Note    Patient Details  Name: Charles Fox MRN: [id number] Date of Birth: 1951-07-26  Transition of Care [facility]) CM/SW [place], RN Phone Number: 06/03/2021, 9:12 AM  Clinical Narrative:    CM called and left a message with the patient's son and son's girlfriend on the phone to call back and discuss discharge plans for this patient.  The patient has not bed offers at this time and may not likely get approved for SNF placement through [facility] at this time since the patient is ambulating with use of a rollator and will need to be discharged home with family for 24 hour supervision due to short term memory issues and confusion.  CM called [employer] to have a rollator delivered to the patient's room today.  CM and MSW with DTP will continue to follow the patient for discharge needs for home with family.  06/03/2021 1000 - CM called and spoke with the patient's son, [name] on the phone and the son states that he is currently living with friends and he is unable to provide housing or 24-hour supervision for the patient.  The patient's son states that he also does not have reliable transportation and has been unable to visit with the patient at the hospital nor pick up the patient's truck from the [facility] at this time.  The patient's son states that the patient receives a 1300.00 social security check and 950.00 pension every month and is willing to assist the patient and use the patient's check to cover the cost of a memory care or ALF locked facility for care.  The son states that once he is able to find a home for his family and the patient he will be able to take the patient home after time spent with care at an ALF.  I called and spoke with [name], CM at [facility] and she states that they have a locked unit located in [place], [place] and she will check with the facility director for an available bed.   Clinicals were emailed to [email or web address].    Expected Discharge Plan: Skilled Nursing Facility Barriers to Discharge: SNF Pending bed offer  Expected Discharge Plan and Services Expected Discharge Plan: [place] In-house Referral: Clinical Social Work, PCP / Psychologist, educational Discharge Planning Services: CM Consult Post Acute Care Choice: [place] Living arrangements for the past 2 months: Homeless (Son requests that patient discharge to his care after SNF placement.)                                       Social Determinants of Health (SDOH) Interventions    Readmission Risk Interventions No flowsheet data found.

## 2021-06-03 NOTE — Progress Notes (Signed)
Family Medicine Teaching Service Daily Progress Note Intern Pager: [phone]  Patient name: Charles Fox Medical record number: [id number] Date of birth: 1951-08-24 Age: 70 y.o. Gender: male  Primary Care Provider: [name], MD Consultants: Psychiatry Code Status: Full  [name]:  8/19: Admitted for altered mental status and hematuria 8/20: Normal brain MRI and improved mentation  8/21: [facility] 12/30, determined to not have capacity 8/22: Discussion with son revealed patient does not have a safe place for discharge 8/23: PSA elevated.  Urology recommending keeping catheter and rechecking PSA in 1 month. 8/24: Voiding trial 8/25: Suicide precautions, voiding trial failed, Foley reinserted 8/26: Suicide precautions removed 8/29 agitated and combative, delirium precautions in place 9/5: AKI 9/9: AKI resolved  Assessment and Plan: Charles Fox is a 70 y.o. male admitted for altered mental status and hematuria with concern for bladder cancer. PMH significant for HTN, T2DM, HLD, tobacco use disorder, and homelessness. Patient is medically stable and awaiting dispo.  Possible Bladder Malignancy  Urinary Retention (resolved) -Flomax 0.4 mg daily -Monitor for hematuria -Outpatient radiology follow-up -Foley in place until outpatient urology removal at end of the month (06/19/21)   Dementia  Intermittent Agitation -Continue Seroquel 100 mg nightly and Ramelteon 8 mg nightly -Appreciate TOC assistance with SNF  -Recommend neurology outpatient referral for dementia  T2DM -Metformin 500 mg twice daily  Tobacco Use -Nicotine patch 14 mg  FEN/GI: heart healthy/carb modified  PPx: Lovenox Dispo:SNF in 3 or more days. Barriers include None.   Subjective:  Patient not oriented to place or day, with some confusion about where he was. Attempted to orient patient, he continues to lack decision making capacity or understanding of why he is in hospital.  Reports that he had a bowel movement yesterday.   Objective: Temp:  [97.7 F (36.5 C)-98.4 F (36.9 C)] 97.8 F (36.6 C) (09/14 2127) Pulse Rate:  [76-107] 76 (09/14 2127) Resp:  [17-20] 18 (09/14 2127) BP: (107-151)/(65-74) 151/74 (09/14 2127) SpO2:  [96 %-99 %] 96 % (09/14 2127) Physical Exam: General: Well appearing, obese, alert but not oriented to place or date Cardiovascular: RRR, [place] Respiratory: CTABL, no wheezes or stridor Abdomen: Soft, non-tender, non-distended Extremities: Pulses intact in all extremities GU: Foley catheter intact and draining urine. [employer] student as chaperone   Laboratory: Recent Labs  Lab 05/31/21 0504  WBC 7.7  HGB 14.4  HCT 44.5  PLT 284   Recent Labs  Lab 05/28/21 0339 05/29/21 0057 05/31/21 0504  NA 138 141 138  K 3.9 3.8 3.6  CL 105 107 105  CO2 '24 24 23  '$ BUN 9 7* 9  CREATININE 1.00 0.97 1.10  CALCIUM 8.9 8.7* 8.7*  GLUCOSE 88 88 65*    Imaging/Diagnostic Tests: None  Holley Bouche, MD 06/03/2021, 7:44 AM PGY-1, [place] Intern pager: [phone], text pages welcome

## 2021-06-03 NOTE — NC FL2 (Signed)
[place] LEVEL OF CARE SCREENING TOOL     IDENTIFICATION  Patient Name: Charles Fox Birthdate: Mar 16, 1951 Sex: male Admission Date (Current Location): 05/07/2021  [facility] and [place] Number:  Herbalist and Address:  The [place], [place] [address], [place], [place] [address]      Provider Number: [id number]  Attending Physician Name and Address:  Zenia Resides, MD  Relative Name and Phone Number:       Current Level of Care: Hospital Recommended Level of Care: Memory Care, [place] Prior Approval Number:    Date Approved/Denied:   PASRR Number: [id number]  Discharge Plan: Other (Comment) (Locked ALF or memory care)    Current Diagnoses: Patient Active Problem List   Diagnosis Date Noted   Hematuria    Suicidal ideation    AMS (altered mental status) 05/09/2021   Altered mental status 05/07/2021   Chest pain 02/26/2021   Acute right-sided thoracic back pain 11/27/2020   History of lacunar cerebrovascular accident 06/11/2020   Dementia without behavioral disturbance ([place]) 05/27/2020   Impaired vision 05/27/2020   Pain, dental 11/20/2019   High priority for COVID-19 virus vaccination 11/20/2019   Chronic pain of both shoulders 09/10/2019   Tendinopathy of left rotator cuff 04/29/2019   Food insecurity 07/09/2018   Skin lesions, generalized 05/09/2018   Hyperlipidemia associated with type 2 diabetes mellitus ([place]) 12/22/2016   Urine test positive for microalbuminuria 05/27/2016   Type 2 diabetes mellitus with neurological complications ([place]) 123456   Morbid obesity ([place]) 09/04/2014   Hypertension associated with diabetes ([place]) 09/04/2014   Tobacco use disorder 09/04/2014    Orientation RESPIRATION BLADDER Height & Weight     Self, Place  Normal Continent, Indwelling catheter (Foley) Weight: 225 lb 12 oz (102.4 kg) Height:  '5\' 8"'$  (172.7 cm)  BEHAVIORAL SYMPTOMS/MOOD NEUROLOGICAL BOWEL  NUTRITION STATUS  Other (Comment) (Dementia)   Continent Diet (Normal)  AMBULATORY STATUS COMMUNICATION OF NEEDS Skin   Supervision Verbally Normal                       Personal Care Assistance Level of Assistance  Bathing, Feeding, Dressing Bathing Assistance: Limited assistance Feeding assistance: Independent Dressing Assistance: Independent     Functional Limitations Info  Sight, Speech, Hearing Sight Info: Adequate Hearing Info: Adequate Speech Info: Adequate    SPECIAL CARE FACTORS FREQUENCY                       Contractures Contractures Info: Not present    Additional Factors Info  Code Status, Allergies, Psychotropic Code Status Info: Full Allergies Info: No known allergies Psychotropic Info: Seroquel         Current Medications (06/03/2021):  This is the current hospital active medication list Current Facility-Administered Medications  Medication Dose Route Frequency Provider Last Rate Last Admin   acetaminophen (TYLENOL) tablet 650 mg  650 mg Oral Q6H PRN Lattie Haw, MD   650 mg at 06/01/21 1445   atorvastatin (LIPITOR) tablet 40 mg  40 mg Oral Daily Simmons-Robinson, Makiera, MD   40 mg at 06/03/21 1023   carvedilol (COREG) tablet 3.125 mg  3.125 mg Oral BID WC Simmons-Robinson, Makiera, MD   3.125 mg at 06/03/21 1023   Chlorhexidine Gluconate Cloth 2 % PADS 6 each  6 each Topical Daily Lind Covert, MD   6 each at 06/03/21 1024   enoxaparin (LOVENOX) injection 40 mg  40 mg Subcutaneous Q24H Espinoza, Alejandra, DO   40 mg at 06/02/21 1345   losartan (COZAAR) tablet 50 mg  50 mg Oral Daily Corky Sox, MD   50 mg at 06/03/21 1023   metFORMIN (GLUCOPHAGE-XR) 24 hr tablet 500 mg  500 mg Oral BID WC Simmons-Robinson, Makiera, MD   500 mg at 06/03/21 1023   mupirocin ointment (BACTROBAN) 2 %   Topical BID Simmons-Robinson, Makiera, MD   Given at 06/03/21 1025   nicotine (NICODERM CQ - dosed in mg/24 hours) patch 14 mg  14 mg Transdermal  Daily Dameron, Marisa, DO   14 mg at 06/03/21 1025   nicotine polacrilex (NICORETTE) gum 2 mg  2 mg Oral Q4H PRN Lenoria Chime, MD       polyethylene glycol (MIRALAX / GLYCOLAX) packet 17 g  17 g Oral Daily [name], DO   17 g at 06/03/21 1023   QUEtiapine (SEROQUEL) tablet 100 mg  100 mg Oral Daily Ezequiel Essex, MD   100 mg at 06/02/21 2151   ramelteon (ROZEREM) tablet 8 mg  8 mg Oral QHS Carollee Leitz, MD   8 mg at 06/02/21 2151   senna (SENOKOT) tablet 8.6 mg  1 tablet Oral BID [name], DO   8.6 mg at 06/03/21 1024   tamsulosin (FLOMAX) capsule 0.4 mg  0.4 mg Oral Daily Simmons-Robinson, Makiera, MD   0.4 mg at 06/03/21 1023     Discharge Medications: Please see discharge summary for a list of discharge medications.  Relevant Imaging Results:  Relevant Lab Results:   Additional Information SSN: [id number]. [employer] 12/02/19, 12/23/19  Archie Endo, LCSW

## 2021-06-03 NOTE — Progress Notes (Addendum)
10:30am: CSW attempted to reach [facility] of [facility] to discuss patient - a voicemail was left requesting a return call.  CSW sent clinicals for review to [facility] for possible placement in a locked ALF unit.  8:15am CSW spoke with Tressa Busman at [facility] who states the facility cannot offer the patient a bed.  [name], MSW, LCSW Transitions of Care  Clinical Social Worker II [phone]

## 2021-06-04 DIAGNOSIS — R4182 Altered mental status, unspecified: Secondary | ICD-10-CM | POA: Diagnosis not present

## 2021-06-04 DIAGNOSIS — N320 Bladder-neck obstruction: Secondary | ICD-10-CM | POA: Diagnosis not present

## 2021-06-04 DIAGNOSIS — N179 Acute kidney failure, unspecified: Secondary | ICD-10-CM | POA: Diagnosis not present

## 2021-06-04 DIAGNOSIS — F0390 Unspecified dementia without behavioral disturbance: Secondary | ICD-10-CM | POA: Diagnosis not present

## 2021-06-04 MED ORDER — NICOTINE 7 MG/24HR TD PT24
7.0000 mg | MEDICATED_PATCH | Freq: Every day | TRANSDERMAL | Status: AC
  Administered 2021-06-06 – 2021-06-19 (×13): 7 mg via TRANSDERMAL
  Filled 2021-06-04 (×14): qty 1

## 2021-06-04 NOTE — Progress Notes (Signed)
FPTS Interim Progress Note  S: Patient is a 70 year old male who presented with AMS and hematuria.  Suspected bladder malignancy, awaiting SNF placement.  Chart review notable for oliguria over the past 2 days, urine output of 0.3 mL/kg/h.  Patient has previously clotted off his Foley catheter with hematuria.  On assessment patient denies any suprapubic fullness or pain, not felt on physical exam.  No hematuria noted in Foley bag.  O: BP 128/80 (BP Location: Left Arm)   Pulse 79   Temp 98.4 F (36.9 C)   Resp 16   Ht '5\' 8"'$  (1.727 m)   Wt 225 lb 12 oz (102.4 kg)   SpO2 93%   BMI 34.33 kg/m   General: elderly male lying in bed, NAD Resp: unlabored respirations  A/P: -Bladder scan given oliguria and history of clotted foley -Remainder per day team   Corky Sox PGY-1, Psychiatry Service pager [phone]

## 2021-06-04 NOTE — TOC Progression Note (Signed)
Transition of Care [facility]) - Progression Note    Patient Details  Name: Charles Fox MRN: [id number] Date of Birth: [birth year]  Transition of Care [facility]) CM/SW [name], RN Phone Number: 06/04/2021, 8:00 AM  Clinical Narrative:    CM spoke with [name], CM with [facility] and she state's that her ALF memory care unit in [place], [place] does not have bed availability at this time but she will review the patient's needs, including foley catheter to determine if she has availability at another facilty.  New FL2 was emailed to [employer].  CM spoke with the patient's son [name] this morning and he plans to visit with the patient this morning and meet with myself and [name], MSW to discuss transitions of care.  CM and MSW will continue to follow the patient for transitions of care needs.   Expected Discharge Plan: Skilled Nursing Facility Barriers to Discharge: SNF Pending bed offer  Expected Discharge Plan and Services Expected Discharge Plan: [place] In-house Referral: Clinical Social Work, PCP / Psychologist, educational Discharge Planning Services: CM Consult Post Acute Care Choice: [place] Living arrangements for the past 2 months: Homeless (Son requests that patient discharge to his care after SNF placement.)                                       Social Determinants of Health (SDOH) Interventions    Readmission Risk Interventions No flowsheet data found.

## 2021-06-04 NOTE — Progress Notes (Signed)
Physical Therapy Treatment Patient Details Name: Charles Fox MRN: [id number] DOB: [birth year] Today's Date: 06/04/2021   History of Present Illness Pt 70 yr old male who presented on 05/07/21 with AMS and hematuria/urinary retention (foley placed 8/15), diagnosis of UTI. Noted per MD notes dementia with intermittent agitation. PMH includes diabetes, CVA  hypertension, hyperlipidemia, homelessness, tobacco use disorder.    PT Comments    Pt received sitting EOB. Min guard assist transfers and ambulation 175' with rollator. Continues to require min guard assist for OOB mobility due to cognition/poor safety awareness. Pt returned to sitting EOB at end of session. Bed alarm activated.    Recommendations for follow up therapy are one component of a multi-disciplinary discharge planning process, led by the attending physician.  Recommendations may be updated based on patient status, additional functional criteria and insurance authorization.  Follow Up Recommendations  SNF;Supervision/Assistance - 24 hour (medical need for SNF/memory care due to cognition)     Equipment Recommendations  Other (comment) (rollator)    Recommendations for Other Services       Precautions / Restrictions Precautions Precautions: Fall;Other (comment) Precaution Comments: foley cath Restrictions Weight Bearing Restrictions: No     Mobility  Bed Mobility Overal bed mobility: Modified Independent             General bed mobility comments: Pt sitting EOB on arrival    Transfers Overall transfer level: Needs assistance Equipment used: Ambulation equipment used Transfers: Sit to/from Stand Sit to Stand: Min guard Stand pivot transfers: Min guard       General transfer comment: minguard for safety and management of foley  Ambulation/Gait Ambulation/Gait assistance: Min guard Gait Distance (Feet): 175 Feet Assistive device: 4-wheeled walker Gait Pattern/deviations: Step-through pattern;Trunk  flexed;Drifts right/left Gait velocity: mildly decreased Gait velocity interpretation: 1.31 - 2.62 ft/sec, indicative of limited community ambulator General Gait Details: cues for safety, easily distracted by staff in hallway   Stairs             Wheelchair Mobility    Modified Rankin (Stroke Patients Only)       Balance Overall balance assessment: Needs assistance Sitting-balance support: No upper extremity supported;Feet supported Sitting balance-Leahy Scale: Good Sitting balance - Comments: independent with sitting balance   Standing balance support: No upper extremity supported;Bilateral upper extremity supported;During functional activity Standing balance-Leahy Scale: Fair Standing balance comment: reliant on at least single UE support, improved stability with BUE                            Cognition Arousal/Alertness: Awake/alert Behavior During Therapy: WFL for tasks assessed/performed Overall Cognitive Status: No family/caregiver present to determine baseline cognitive functioning Area of Impairment: Safety/judgement;Memory;Problem solving;Orientation                 Orientation Level: Situation;Disoriented to   Memory: Decreased short-term memory   Safety/Judgement: Decreased awareness of safety;Decreased awareness of deficits   Problem Solving: Requires verbal cues;Slow processing General Comments: dementia at baseline. Pt repeatedly asking to go outside to get fresh air. Difficulty redirecting.      Exercises      General Comments General comments (skin integrity, edema, etc.): VSS      Pertinent Vitals/Pain Pain Assessment: No/denies pain    Home Living                      Prior Function  PT Goals (current goals can now be found in the care plan section) Acute Rehab PT Goals Patient Stated Goal: go outside Progress towards PT goals: Progressing toward goals    Frequency    Min 2X/week       PT Plan Current plan remains appropriate    Co-evaluation              AM-PAC PT "6 Clicks" Mobility   Outcome Measure  Help needed turning from your back to your side while in a flat bed without using bedrails?: None Help needed moving from lying on your back to sitting on the side of a flat bed without using bedrails?: None Help needed moving to and from a bed to a chair (including a wheelchair)?: A Little Help needed standing up from a chair using your arms (e.g., wheelchair or bedside chair)?: A Little Help needed to walk in hospital room?: A Little Help needed climbing 3-5 steps with a railing? : A Little 6 Click Score: 20    End of Session Equipment Utilized During Treatment: Gait belt Activity Tolerance: Patient tolerated treatment well Patient left: in bed;with call bell/phone within reach;with bed alarm set Nurse Communication: Mobility status PT Visit Diagnosis: Unsteadiness on feet (R26.81);Other abnormalities of gait and mobility (R26.89);Difficulty in walking, not elsewhere classified (R26.2)     Time: [id number] PT Time Calculation (min) (ACUTE ONLY): 13 min  Charges:  $Gait Training: 8-22 mins                     Lorrin Goodell, PT  Office # [phone] Pager [phone]    Lorriane Shire 06/04/2021, 11:31 AM

## 2021-06-04 NOTE — Progress Notes (Addendum)
2:20pm: Per Tressa Busman at [facility] - patient will not be offered a bed.  12:30pm: CSW spoke with Juliann Pulse who states the facility does not have any beds at this time but did confirm the patient has active Medicare A and B #[id number].   CSW spoke with Tressa Busman at [facility] to request he review patient's clinicals.  CSW spoke with Shirlee Limerick at [facility] to request she review patient's clinicals.  10am: CSW spoke with staff at [facility] who states patient's subscriber ID is invalid and that he is not an active member.  CSW reviewed insurance card in chart and it states patient has active Medicare A and B.  CSW spoke with Juliann Pulse at [facility] who is agreeable to review patient and determine what insurance benefits he currently has.  9:00am: DTP staff met with patient and his son [name] at bedside to discuss discharge planning. DTP staff provided [name] with contact information for [place] of financial counseling and [facility] of [facility] and encouraged him to follow up as soon as possible. Both patient and his son are agreeable to placement in an ALF. Patient's clinical information is currently under review by [place] of [facility] at this time.  Dr. Andria Frames was provided with an update and he is aware of the plan.  [name], MSW, LCSW Transitions of Care  Clinical Social Worker II [phone]

## 2021-06-04 NOTE — Progress Notes (Signed)
Family Medicine Teaching Service Daily Progress Note Intern Pager: [phone]  Patient name: Charles Fox Medical record number: [id number] Date of birth: 26-Apr-1951 Age: 70 y.o. Gender: male  Primary Care Provider: [name], MD Consultants: Psychiatry Code Status: Full  [name]:  8/19: Admitted for altered mental status and hematuria 8/20: Normal brain MRI and improved mentation  8/21: [facility] 12/30, determined to not have capacity 8/22: Discussion with son revealed patient does not have a safe place for discharge 8/23: PSA elevated.  Urology recommending keeping catheter and rechecking PSA in 1 month. 8/24: Voiding trial 8/25: Suicide precautions, voiding trial failed, Foley reinserted 8/26: Suicide precautions removed 8/29 agitated and combative, delirium precautions in place 9/5: AKI 9/9: AKI resolved  Assessment and Plan: Charles Fox is a 70 y.o. male admitted for altered mental status and hematuria with concern for bladder cancer. PMH significant for HTN, T2DM, HLD, tobacco use disorder, and homelessness. Patient is medically stable and awaiting dispo.  Possible Bladder Malignancy  Urinary Retention (resolved) -Continue Flomax 0.4 mg daily -Monitor for hematuria -Outpatient radiology follow-up -Foley in place until outpatient urology removal at end of month (06/19/21)  Dementia  Intermittent Agitation -Continue Seroquel 100 mg nightly and ramelteon 8 mg nightly -Appreciate TOC assistance with SNF -Recommend neurology outpatient referral for dementia  T2DM -Metformin 500 mg twice daily  Tobacco Use -Nicotine patch 14 mg  FEN/GI: heart healthy/carb modified  PPx: Lovenox Dispo:SNF in 3 or more days. Barriers include none.   Subjective:  Patient was alert and oriented to person, and place, in good mood and interactive this morning. Patient still lacks an understanding of why he has a catheter/came to the hospital.    Objective: Temp:  [97.5 F (36.4 C)-98.3 F (36.8 C)] 97.6 F (36.4 C) (09/16 0746) Pulse Rate:  [75-83] 82 (09/16 0746) Resp:  [17-20] 17 (09/16 0746) BP: (127-136)/(69-89) 133/69 (09/16 0746) SpO2:  [99 %-100 %] 100 % (09/16 0746) Physical Exam: General: Well appearing, obese, african [place] male, alert and oriented to person and place Cardiovascular: RRR, [place] Respiratory: CTABL, no wheezes or stridor Abdomen: Soft, non-tender, non-distended Extremities: Pulses intact in all extremities GU: Normal appearing male genitalia with foley intact and draining urine. [place] student was chaperone   Laboratory: Recent Labs  Lab 05/31/21 0504  WBC 7.7  HGB 14.4  HCT 44.5  PLT 284   Recent Labs  Lab 05/29/21 0057 05/31/21 0504  NA 141 138  K 3.8 3.6  CL 107 105  CO2 24 23  BUN 7* 9  CREATININE 0.97 1.10  CALCIUM 8.7* 8.7*  GLUCOSE 88 65*     Imaging/Diagnostic Tests: None  Holley Bouche, MD 06/04/2021, 7:47 AM PGY-1, [place] Intern pager: [phone], text pages welcome

## 2021-06-04 NOTE — Progress Notes (Signed)
Bladder scan performed. 24m noted. Foley emptied;  2567mclear, yellow urine. No pain noted.

## 2021-06-04 NOTE — Progress Notes (Signed)
Patient's son came and visit; patient gave his car keys to son [name]).

## 2021-06-04 NOTE — TOC Progression Note (Addendum)
Transition of Care [facility]) - Progression Note    Patient Details  Name: Charles Fox MRN: [id number] Date of Birth: Jul 16, 1951  [facility] Care [facility]) CM/SW Contact  Curlene Labrum, RN Phone Number: 06/04/2021, 9:19 AM  Clinical Narrative:    Case management and [name], MSW met with the patient and son, Charles Fox, at the bedside to discuss transitions of care to an ALF is possible.  The patient and son were agreeable to the plan for placement since the patient's son was currently living with an uncle and unable to care for the patient at the time to provide housing nor 24-hour care support.  The son, Charles Fox, was given Rushie Chestnut, DSS number to contact, Santa Lighter, financial counseling number to contact and [employer].  The patient's son received the patient's truck keys from the patient and will be moving the patient's truck home after visiting at the hospital.  I called and spoke with [name], admissions CM at [facility] and she requested additional clinicals and updated FL2 to review for possible bed offer to one of the facilities in the area.  [employer] delivered the patient's rollator to the hospital room on 06/03/2021.  CM and MSW with DTP Team will continue to follow the patient for possible ALF placement.   Expected Discharge Plan: Skilled Nursing Facility Barriers to Discharge: SNF Pending bed offer  Expected Discharge Plan and Services Expected Discharge Plan: [place] In-house Referral: Clinical Social Work, PCP / Psychologist, educational Discharge Planning Services: CM Consult Post Acute Care Choice: [place] Living arrangements for the past 2 months: Homeless (Son requests that patient discharge to his care after SNF placement.)                                       Social Determinants of Health (SDOH) Interventions    Readmission Risk Interventions No flowsheet data found.

## 2021-06-05 DIAGNOSIS — N320 Bladder-neck obstruction: Secondary | ICD-10-CM | POA: Diagnosis not present

## 2021-06-05 DIAGNOSIS — F0390 Unspecified dementia without behavioral disturbance: Secondary | ICD-10-CM | POA: Diagnosis not present

## 2021-06-05 DIAGNOSIS — R4182 Altered mental status, unspecified: Secondary | ICD-10-CM | POA: Diagnosis not present

## 2021-06-05 DIAGNOSIS — N179 Acute kidney failure, unspecified: Secondary | ICD-10-CM | POA: Diagnosis not present

## 2021-06-05 LAB — BASIC METABOLIC PANEL
Anion gap: 11 (ref 5–15)
BUN: 9 mg/dL (ref 8–23)
CO2: 24 mmol/L (ref 22–32)
Calcium: 8.9 mg/dL (ref 8.9–10.3)
Chloride: 103 mmol/L (ref 98–111)
Creatinine, Ser: 1.22 mg/dL (ref 0.61–1.24)
GFR, Estimated: >60 mL/min (ref >60)
Glucose, Bld: 86 mg/dL (ref 70–99)
Potassium: 3.8 mmol/L (ref 3.5–5.1)
Sodium: 138 mmol/L (ref 135–145)

## 2021-06-05 NOTE — Progress Notes (Signed)
Family Medicine Teaching Service Daily Progress Note Intern Pager: [phone]  Patient name: Charles Fox Medical record number: [id number] Date of birth: Dec 18, 1950 Age: 70 y.o. Gender: male  Primary Care Provider: [name], MD Consultants: none Code Status: FULL  [name]:  8/19: Admitted for altered mental status and hematuria 8/20: Normal brain MRI and improved mentation  8/21: [facility] 12/30, determined to not have capacity 8/22: Discussion with son revealed patient does not have a safe place for discharge 8/23: PSA elevated.  Urology recommending keeping catheter and rechecking PSA in 1 month. 8/24: Voiding trial 8/25: Suicide precautions, voiding trial failed, Foley reinserted 8/26: Suicide precautions removed 8/29 agitated and combative, delirium precautions in place 9/5: AKI 9/9: AKI resolved  Assessment and Plan: The patient is a 70 year old male admitted for altered mental status and hematuria with concern for bladder malignancy.  Past medical history significant for hypertension, T2DM, HLD, tobacco use disorder, and homelessness.  Patient is medically stable and awaiting SNF placement.  Urinary retention (resolved), possible bladder malignancy Concern for urinary retention given decreased urine output for the past 2 days, 0.3 mL/kg/h.  Patient may have decreased p.o. intake secondary to dementia.  BMP showing creatinine within normal limits. -Continue Flomax 0.4 mg daily - Monitor for hematuria - Outpatient urology follow-up - Foley in place until outpatient urology removal at end of month (06/19/2021)  Dementia, intermittent agitation - Continue Seroquel 100 mg nightly and ramelteon 8 mg nightly - Appreciate TOC assistance with SNF - Recommend neurology outpatient referral for dementia  T2DM - Metformin 500 mg twice daily  Tobacco use - Nicotine patch 14 mg  FEN/GI: heart healthy PPx: Lovenox Dispo: SNF pending  placement  Subjective:  Patient found lying in bed this morning. He denies any complaints or pain.  Review of systems is negative for nausea/vomiting, chest pain, shortness of breath.   Objective: Temp:  [97.6 F (36.4 C)-98.4 F (36.9 C)] 98.4 F (36.9 C) (09/16 1931) Pulse Rate:  [79-82] 79 (09/16 1931) Resp:  [16-18] 16 (09/16 1931) BP: (127-133)/(69-80) 128/80 (09/16 1931) SpO2:  [93 %-100 %] 93 % (09/16 1931) Weight:  [223 lb 1.7 oz (101.2 kg)] 223 lb 1.7 oz (101.2 kg) (09/17 0443) Physical Exam: General: elderly male lying in bed, NAD Cardiovascular: RRR, NMR Respiratory: CTAB, unlabored respirations Abdomen: NTTP, normal bs  Laboratory: Recent Labs  Lab 05/31/21 0504  WBC 7.7  HGB 14.4  HCT 44.5  PLT 284   Recent Labs  Lab 05/31/21 0504  NA 138  K 3.6  CL 105  CO2 23  BUN 9  CREATININE 1.10  CALCIUM 8.7*  GLUCOSE 65*     Imaging/Diagnostic Tests: Bmp with normal Cr  Corky Sox PGY-1, Psychiatry FPTS Intern pager: [phone], text pages welcome

## 2021-06-06 DIAGNOSIS — N320 Bladder-neck obstruction: Secondary | ICD-10-CM | POA: Diagnosis not present

## 2021-06-06 DIAGNOSIS — N179 Acute kidney failure, unspecified: Secondary | ICD-10-CM | POA: Diagnosis present

## 2021-06-06 DIAGNOSIS — F0390 Unspecified dementia without behavioral disturbance: Secondary | ICD-10-CM | POA: Diagnosis not present

## 2021-06-06 DIAGNOSIS — R4182 Altered mental status, unspecified: Secondary | ICD-10-CM | POA: Diagnosis not present

## 2021-06-06 MED ORDER — TAMSULOSIN HCL 0.4 MG PO CAPS
0.8000 mg | ORAL_CAPSULE | Freq: Every day | ORAL | Status: DC
  Administered 2021-06-07 – 2021-09-24 (×110): 0.8 mg via ORAL
  Filled 2021-06-06 (×111): qty 2

## 2021-06-06 MED ORDER — FINASTERIDE 5 MG PO TABS
5.0000 mg | ORAL_TABLET | Freq: Every day | ORAL | Status: DC
  Administered 2021-06-06 – 2021-09-24 (×111): 5 mg via ORAL
  Filled 2021-06-06 (×111): qty 1

## 2021-06-06 NOTE — Progress Notes (Addendum)
FPTS Interim Progress Note  S:Patient sleeping and resting comfortably.  Rounded with primary RN.  Pt had two episodes of diarrhea today.  PM [place] held. Appreciated nightly round.  O: BP 130/81 (BP Location: Left Arm)   Pulse 81   Temp 97.8 F (36.6 C) (Oral)   Resp 16   Ht '5\' 8"'$  (1.727 m)   Wt 101.2 kg   SpO2 100%   BMI 33.92 kg/m     A/P: Diarrhea Held PM [place]   See daily progress note. Discharge when pt has safe disposition.    [name], DO PGY-3, [place] Intern pager [phone]

## 2021-06-06 NOTE — Progress Notes (Signed)
Family Medicine Teaching Service Daily Progress Note Intern Pager: [phone]  Patient name: Charles Fox Medical record number: [id number] Date of birth: 1951-04-26 Age: 71 y.o. Gender: male  Primary Care Provider: [name], MD Consultants: none Code Status: Full  [name]:  8/19: Admitted for altered mental status and hematuria 8/20: Normal brain MRI and improved mentation  8/21: [facility] 12/30, determined to not have capacity 8/22: Discussion with son revealed patient does not have a safe place for discharge 8/23: PSA elevated.  Urology recommending keeping catheter and rechecking PSA in 1 month. 8/24: Voiding trial 8/25: Suicide precautions, voiding trial failed, Foley reinserted 8/26: Suicide precautions removed 8/29 agitated and combative, delirium precautions in place 9/5: AKI 9/9: AKI resolved  Assessment and Plan: The patient is a 70 year old male admitted for altered mental status and hematuria with concern for bladder malignancy.  Past medical history significant for hypertension, T2DM, HLD, tobacco use disorder, and homelessness.  Patient is medically stable and awaiting SNF placement.  Urinary retention  Possible bladder malignancy Bladder scan 337 cc. Foley catheter replaced and urinary retention resolved. Pt continues to have decreased urinary output with only 650 cc yesterday. Creatine at baseline.  - Monitor UOP and for hematuria  -Continue Flomax 0.4 mg daily - Continue Foley until urology follow up - Urology recommended Foley removal at end of month (06/19/2021) and repeat PSA - Repeat PSA on 9/23   Dementia, intermittent agitation Stable. No agitation overnight.  - Continue Seroquel 100 mg nightly and ramelteon 8 mg nightly - Recommend neurology outpatient follow up for dementia   T2DM Stable. Recent glucose 86.  - Metformin 500 mg twice daily  Tobacco use - Nicotine patch 14 mg  FEN/GI: heart healthy carb modified diet,  replete electrolytes as needed  PPx: Lovenox   Disposition: pending safe disposition, ?SNF  Subjective:  Denies pain. Had a good night's rest. No additional episodes of diarrhea after PM round.   Objective: Temp:  [97.7 F (36.5 C)-98 F (36.7 C)] 98 F (36.7 C) (09/18 0407) Pulse Rate:  [81-94] 81 (09/18 0407) Resp:  [16] 16 (09/18 0407) BP: (100-130)/(67-86) 129/86 (09/18 0407) SpO2:  [100 %] 100 % (09/18 0407)  Physical Exam: General: alert and resting in bed, conversing with primary RN at bedside  Cardiovascular: regular rate and rhythm Respiratory: clear to ascultation bilaterally, no increased work of breathing  Abdomen: non-tender, soft  Extremities: no LE edema, non-tender   Laboratory: Recent Labs  Lab 05/31/21 0504  WBC 7.7  HGB 14.4  HCT 44.5  PLT 284   Recent Labs  Lab 05/31/21 0504 06/05/21 0609  NA 138 138  K 3.6 3.8  CL 105 103  CO2 23 24  BUN 9 9  CREATININE 1.10 1.22  CALCIUM 8.7* 8.9  GLUCOSE 65* 86      Imaging/Diagnostic Tests: No results found.   [name], DO 06/06/2021, 7:27 AM PGY-3, [place] Intern pager: [phone], text pages welcome

## 2021-06-07 ENCOUNTER — Other Ambulatory Visit (HOSPITAL_COMMUNITY): Payer: Self-pay

## 2021-06-07 DIAGNOSIS — R4182 Altered mental status, unspecified: Secondary | ICD-10-CM | POA: Diagnosis not present

## 2021-06-07 DIAGNOSIS — N320 Bladder-neck obstruction: Secondary | ICD-10-CM | POA: Diagnosis not present

## 2021-06-07 DIAGNOSIS — F0390 Unspecified dementia without behavioral disturbance: Secondary | ICD-10-CM | POA: Diagnosis not present

## 2021-06-07 DIAGNOSIS — N179 Acute kidney failure, unspecified: Secondary | ICD-10-CM | POA: Diagnosis not present

## 2021-06-07 LAB — CBC
HCT: 42.9 % (ref 39.0–52.0)
Hemoglobin: 14.5 g/dL (ref 13.0–17.0)
MCH: 29.7 pg (ref 26.0–34.0)
MCHC: 33.8 g/dL (ref 30.0–36.0)
MCV: 87.7 fL (ref 80.0–100.0)
Platelets: 265 10*3/uL (ref 150–400)
RBC: 4.89 MIL/uL (ref 4.22–5.81)
RDW: 13.8 % (ref 11.5–15.5)
WBC: 7.4 10*3/uL (ref 4.0–10.5)
nRBC: 0 % (ref 0.0–0.2)

## 2021-06-07 NOTE — Progress Notes (Signed)
FPTS Brief Progress Note  S: Lying in bed sleeping.   O: BP 134/75 (BP Location: Left Arm)   Pulse 77   Temp (!) 97.4 F (36.3 C) (Oral)   Resp 17   Ht 5\' 8"  (1.727 m)   Wt 101.2 kg   SpO2 100%   BMI 33.92 kg/m    A/P: 1. Pain  2. Altered mental status, unspecified altered mental status type  3. Hematuria, unspecified type  4. AKI (acute kidney injury) ([place])  - Orders reviewed. Labs weekly ordered, which was adjusted as needed.   Gerlene Fee, DO 06/07/2021, 12:17 AM PGY-3, [place] Night Resident  Please page [phone] with questions.

## 2021-06-07 NOTE — Progress Notes (Signed)
Family Medicine Teaching Service Daily Progress Note Intern Pager: [phone]  Patient name: Charles Fox Medical record number: [id number] Date of birth: 1951/04/22 Age: 70 y.o. Gender: male  Primary Care Provider: [name], MD Consultants: none Code Status: FULL  [name]:  8/19: Admitted for altered mental status and hematuria 8/20: Normal brain MRI and improved mentation  8/21: [facility] 12/30, determined to not have capacity 8/22: Discussion with son revealed patient does not have a safe place for discharge 8/23: PSA elevated.  Urology recommending keeping catheter and rechecking PSA in 1 month. 8/24: Voiding trial 8/25: Suicide precautions, voiding trial failed, Foley reinserted 8/26: Suicide precautions removed 8/29 agitated and combative, delirium precautions in place 9/5: AKI 9/9: AKI resolved  Assessment and Plan: The patient is a 70 year old male admitted for altered mental status and hematuria with concern for bladder malignancy.  Past medical history significant for hypertension, T2DM, HLD, tobacco use disorder, and homelessness.  Patient is medically stable and awaiting SNF placement.  Urinary retention, possible bladder malignancy Patient had his Foley catheter removed yesterday.  He successfully voided.  Bladder scan showing 200 mL of urine, unclear if this was a true postvoid residual.  We will reobtain PVR today. - Continue to monitor urine output and hematuria - Continue Flomax 0.4 mg daily and finasteride -- Repeat PSA on 9/23 per urology - Urology outpatient follow-up  Dementia, intermittent agitation Stable. No agitation overnight.  - Continue Seroquel 100 mg nightly and ramelteon 8 mg nightly - Recommend neurology outpatient follow up for dementia   T2DM Stable. Recent glucose 86.  - Metformin 500 mg twice daily   Tobacco use - Nicotine patch 14 mg   FEN/GI: heart healthy carb modified diet, replete electrolytes as needed   PPx: Lovenox   Subjective:  Charles Fox is found in his bed this morning.  He denies any complaints.  He appears to be at his baseline mental status.  Review of systems is negative for chest pain, shortness of breath, nausea/vomiting.  Objective: Temp:  [97.4 F (36.3 C)-97.8 F (36.6 C)] 97.8 F (36.6 C) (09/19 1204) Pulse Rate:  [70-84] 70 (09/19 1204) Resp:  [16-17] 17 (09/19 1204) BP: (112-134)/(69-75) 112/70 (09/19 1204) SpO2:  [97 %-100 %] 97 % (09/19 1204) Physical Exam Vitals reviewed.  Cardiovascular:     Rate and Rhythm: Normal rate and regular rhythm.     Heart sounds: No murmur heard. Pulmonary:     Effort: Pulmonary effort is normal.     Breath sounds: Normal breath sounds.  Abdominal:     General: Bowel sounds are normal.     Palpations: Abdomen is soft.  Neurological:     Mental Status: He is alert.     Laboratory: Recent Labs  Lab 06/07/21 0600  WBC 7.4  HGB 14.5  HCT 42.9  PLT 265   Recent Labs  Lab 06/05/21 0609  NA 138  K 3.8  CL 103  CO2 24  BUN 9  CREATININE 1.22  CALCIUM 8.9  GLUCOSE 86     Imaging/Diagnostic Tests: None new  Corky Sox PGY-1, Psychiatry FPTS Intern pager: [phone], text pages welcome

## 2021-06-07 NOTE — Progress Notes (Addendum)
1:50pm: CSW spent ~60 minutes on the phone with [facility] staff attempting to determine which entity manages the patient. [employer] states a clinical staff member will reach out to [facility] with a final determination to initiate insurance authorization.  12:30pm: CSW spoke with Melissa at [facility] who states the facility does have availability in their locked unit - Melissa to review.  CSW  9am: CSW attempted to reach patient's son [name] via call without success - a voicemail and text was sent requesting a return call.  [name], MSW, LCSW Transitions of Care  Clinical Social Worker II [phone]

## 2021-06-07 NOTE — Progress Notes (Signed)
Spoke with RN Karlene Einstein who reports a PVR was done yesterday with 200 cc left over. I was not able to find this documented in chart. Will repeat bladder scan at next void as patient has long history of urinary retention.  Gladys Damme, MD [facility], PGY-3

## 2021-06-08 DIAGNOSIS — R4182 Altered mental status, unspecified: Secondary | ICD-10-CM | POA: Diagnosis not present

## 2021-06-08 DIAGNOSIS — N179 Acute kidney failure, unspecified: Secondary | ICD-10-CM | POA: Diagnosis not present

## 2021-06-08 DIAGNOSIS — F0390 Unspecified dementia without behavioral disturbance: Secondary | ICD-10-CM | POA: Diagnosis not present

## 2021-06-08 DIAGNOSIS — N320 Bladder-neck obstruction: Secondary | ICD-10-CM | POA: Diagnosis not present

## 2021-06-08 NOTE — Progress Notes (Signed)
FPTS Brief Progress Note  S:Patient sleeping comfortably in bed.   O: BP 112/65 (BP Location: Right Arm)   Pulse 78   Temp 98.1 F (36.7 C) (Oral)   Resp 18   Ht 5\' 8"  (1.727 m)   Wt 101.2 kg   SpO2 97%   BMI 33.92 kg/m     A/P: - Orders reviewed. Labs for AM not ordered, which was adjusted as needed. Patient receiving labs weekly.    Holley Bouche, MD 06/08/2021, 1:26 AM PGY-1, [facility] Family Medicine Night Resident  Please page [phone] with questions.

## 2021-06-08 NOTE — Progress Notes (Addendum)
11am: CSW spoke with Talbert Forest of APS who to discuss discharge planning. CSW explained barriers to placement at this time that include not having a discharge plan post SNF, not having pending Medicaid, and patient's need for a dedicated decision maker due to his confusion. CSW explained that no bed offers have been obtained for patient and that he requires a locked unit due to his ability to ambulate. Breanna to come visit patient either this afternoon or tomorrow to get a better understanding of his current needs and situation.  CSW attempted to reach patient's son without success.  8:15am: CSW spoke with Maudie Mercury from [facility] who requested clinicals be faxed to [phone] - clinicals faxed.  [name], MSW, LCSW Transitions of Care  Clinical Social Worker II [phone]

## 2021-06-08 NOTE — Progress Notes (Signed)
Family Medicine Teaching Service Daily Progress Note Intern Pager: [phone]  Patient name: Charles Fox Medical record number: [id number] Date of birth: 1951-05-29 Age: 70 y.o. Gender: male  Primary Care Provider: [name], MD Consultants: None Code Status: FULL  [name]:  8/19: Admitted for altered mental status and hematuria 8/20: Normal brain MRI and improved mentation  8/21: [facility] 12/30, determined to not have capacity 8/22: Discussion with son revealed patient does not have a safe place for discharge 8/23: PSA elevated.  Urology recommending keeping catheter and rechecking PSA in 1 month. 8/24: Voiding trial 8/25: Suicide precautions, voiding trial failed, Foley reinserted 8/26: Suicide precautions removed 8/29 agitated and combative, delirium precautions in place 9/5: AKI 9/9: AKI resolved   Assessment and Plan: The patient is a 70 year old male admitted for altered mental status and hematuria with concern for bladder malignancy.  Past medical history significant for hypertension, T2DM, HLD, tobacco use disorder, and homelessness.  Patient is medically stable and awaiting SNF placement.   Urinary retention, possible bladder malignancy Nurse reported concern for decreased UOP. However, patient with a bladder scan of 4 mL. He reports urinating normally and likely is simply not informing nurse of his voids. Previous Cr has been normal. Low suspicion for AKI.  - Continue Flomax 0.4 mg daily and finasteride -- Repeat PSA on 9/23 per urology - Urology outpatient follow-up   Dementia, intermittent agitation Stable. No agitation overnight.  - Continue Seroquel 100 mg nightly and ramelteon 8 mg nightly - Recommend neurology outpatient follow up for dementia   T2DM Stable. - Metformin 500 mg twice daily   Tobacco use - Nicotine patch 14 mg   FEN/GI: heart healthy carb modified diet, replete electrolytes as needed  PPx: Lovenox  Dispo:  awaiting SNF placement  Subjective:  On interview this morning, Charles Fox is found lying comfortably in bed. He has no complaints and obviously enjoys talking. ROS negative for CP, SoB, N/V.   Objective: Temp:  [98 F (36.7 C)-98.3 F (36.8 C)] 98.3 F (36.8 C) (09/20 0808) Pulse Rate:  [75-80] 80 (09/20 0808) Resp:  [18-20] 20 (09/20 0808) BP: (110-118)/(56-95) 118/68 (09/20 0808) SpO2:  [97 %-98 %] 98 % (09/20 9407) Physical Exam Vitals reviewed.  Cardiovascular:     Rate and Rhythm: Normal rate and regular rhythm.  Pulmonary:     Effort: Pulmonary effort is normal.     Breath sounds: Normal breath sounds.  Abdominal:     General: Bowel sounds are normal.     Palpations: Abdomen is soft.     Tenderness: There is no abdominal tenderness.  Neurological:     Mental Status: He is alert.     Laboratory: Recent Labs  Lab 06/07/21 0600  WBC 7.4  HGB 14.5  HCT 42.9  PLT 265   Recent Labs  Lab 06/05/21 0609  NA 138  K 3.8  CL 103  CO2 24  BUN 9  CREATININE 1.22  CALCIUM 8.9  GLUCOSE 86    Imaging/Diagnostic Tests: None new   Charles Fox PGY-1, Psychiatry FPTS Intern pager: [phone], text pages welcome

## 2021-06-08 NOTE — Progress Notes (Signed)
FPTS Brief Progress Note  S:Patient sleeping comfortably in bed.   O: BP (!) 151/91 (BP Location: Left Arm)   Pulse 80   Temp 98.1 F (36.7 C) (Oral)   Resp 18   Ht 5\' 8"  (1.727 m)   Wt 101.2 kg   SpO2 97%   BMI 33.92 kg/m     A/P: - Orders reviewed. Labs for AM not ordered, which was adjusted as needed. Patient receives labs once a week.   Holley Bouche, MD 06/08/2021, 11:31 PM PGY-1, [facility] [facility] Night Resident  Please page [phone] with questions.

## 2021-06-08 NOTE — Progress Notes (Signed)
Physical Therapy Treatment Patient Details Name: Charles Fox MRN: [id number] DOB: 1951-07-21 Today's Date: 06/08/2021   History of Present Illness Pt 70 yr old male who presented on 05/07/21 with AMS and hematuria/urinary retention (foley placed 8/15), diagnosis of UTI. Noted per MD notes dementia with intermittent agitation. PMH includes diabetes, CVA  hypertension, hyperlipidemia, homelessness, tobacco use disorder.    PT Comments    Patient continues with unsteadiness requiring use of rollator for ambulation. (Without device drifts to his left, even when looking straight ahead without cognitive challenges). Does however continue to need repetition for safe transfers with rollator and when to lock/unlock brakes. Goals remain appropriate.     Recommendations for follow up therapy are one component of a multi-disciplinary discharge planning process, led by the attending physician.  Recommendations may be updated based on patient status, additional functional criteria and insurance authorization.  Follow Up Recommendations  SNF;Supervision/Assistance - 24 hour (medical need for SNF/memory care due to cognition)     Equipment Recommendations  Other (comment) (rollator)    Recommendations for Other Services       Precautions / Restrictions Precautions Precautions: Fall;Other (comment)     Mobility  Bed Mobility Overal bed mobility: Modified Independent Bed Mobility: Supine to Sit;Sit to Supine     Supine to sit: Modified independent (Device/Increase time) Sit to supine: Supervision;Modified independent (Device/Increase time)        Transfers Overall transfer level: Needs assistance Equipment used: 4-wheeled walker Transfers: Sit to/from Stand Sit to Stand: Min guard         General transfer comment: cues for proper sequencing with hand brakes on rollator--pt tries to unlock brakes prior to come to stand and required cues each of 3 transfers; pt tries to park  rollator off to the side as he approaches bed to sit down--cues and assist for sequencing with stand to sit  Ambulation/Gait Ambulation/Gait assistance: Min guard;Supervision Gait Distance (Feet): 175 Feet (x 2; without device; with rollator) Assistive device: 4-wheeled walker;None Gait Pattern/deviations: Step-through pattern;Drifts right/left;Wide base of support Gait velocity: mildly decreased   General Gait Details: without rollator pt drifts to his left (when looking straight ahead); with rollator is able to turn his head to talk to staff as he passes by without imbalance   Stairs             Wheelchair Mobility    Modified Rankin (Stroke Patients Only)       Balance Overall balance assessment: Needs assistance Sitting-balance support: No upper extremity supported;Feet supported Sitting balance-Leahy Scale: Good Sitting balance - Comments: independent with sitting balance   Standing balance support: No upper extremity supported;Bilateral upper extremity supported;During functional activity Standing balance-Leahy Scale: Fair Standing balance comment: static balance without UE support; dynamic needs UE support                            Cognition Arousal/Alertness: Awake/alert Behavior During Therapy: WFL for tasks assessed/performed Overall Cognitive Status: No family/caregiver present to determine baseline cognitive functioning Area of Impairment: Safety/judgement;Memory;Problem solving                     Memory: Decreased short-term memory   Safety/Judgement: Decreased awareness of safety;Decreased awareness of deficits   Problem Solving: Requires verbal cues;Slow processing General Comments: dementia at baseline. Requires cues for safe use of rollator, especially with transfers      Exercises      General Comments  Pertinent Vitals/Pain Pain Assessment: Faces Faces Pain Scale: Hurts a little bit Pain Location: back Pain  Descriptors / Indicators: Aching Pain Intervention(s): Limited activity within patient's tolerance    Home Living                      Prior Function            PT Goals (current goals can now be found in the care plan section) Acute Rehab PT Goals Patient Stated Goal: go for a walk Time For Goal Achievement: 06/15/21 Potential to Achieve Goals: Fair Progress towards PT goals: Progressing toward goals (continued need for transfer education/repetition)    Frequency    Min 2X/week      PT Plan Current plan remains appropriate    Co-evaluation              AM-PAC PT "6 Clicks" Mobility   Outcome Measure  Help needed turning from your back to your side while in a flat bed without using bedrails?: None Help needed moving from lying on your back to sitting on the side of a flat bed without using bedrails?: None Help needed moving to and from a bed to a chair (including a wheelchair)?: A Little Help needed standing up from a chair using your arms (e.g., wheelchair or bedside chair)?: A Little Help needed to walk in hospital room?: A Little Help needed climbing 3-5 steps with a railing? : A Little 6 Click Score: 20    End of Session Equipment Utilized During Treatment: Gait belt Activity Tolerance: Patient tolerated treatment well Patient left: in bed;with call bell/phone within reach;with bed alarm set   PT Visit Diagnosis: Unsteadiness on feet (R26.81);Other abnormalities of gait and mobility (R26.89);Difficulty in walking, not elsewhere classified (R26.2) Pain - part of body:  (low back)     Time: [id number] PT Time Calculation (min) (ACUTE ONLY): 18 min  Charges:  $Gait Training: 8-22 mins                      Arby Barrette, PT Pager [phone]    Rexanne Mano 06/08/2021, 10:40 AM

## 2021-06-09 ENCOUNTER — Other Ambulatory Visit (HOSPITAL_COMMUNITY): Payer: Self-pay

## 2021-06-09 DIAGNOSIS — N320 Bladder-neck obstruction: Secondary | ICD-10-CM | POA: Diagnosis not present

## 2021-06-09 DIAGNOSIS — N179 Acute kidney failure, unspecified: Secondary | ICD-10-CM | POA: Diagnosis not present

## 2021-06-09 DIAGNOSIS — R4182 Altered mental status, unspecified: Secondary | ICD-10-CM | POA: Diagnosis not present

## 2021-06-09 DIAGNOSIS — F0390 Unspecified dementia without behavioral disturbance: Secondary | ICD-10-CM | POA: Diagnosis not present

## 2021-06-09 MED ORDER — OLOPATADINE HCL 0.1 % OP SOLN
1.0000 [drp] | Freq: Two times a day (BID) | OPHTHALMIC | Status: DC
  Administered 2021-06-09 – 2021-08-09 (×111): 1 [drp] via OPHTHALMIC
  Filled 2021-06-09 (×2): qty 5

## 2021-06-09 NOTE — Progress Notes (Addendum)
1:45pm: CSW spoke with MD to inform him of request.  12:30pm: CSW received request from [place] at [facility] who is requesting a new assessment of the patient's capacity.  CSW spoke with Freda Munro who states the [facility]'s locked unit has a COVID outbreak at this time.  11:15am: Patient was declined admission to [facility].  10am: CSW spoke with Rande Brunt to request she assist with a review with [facility].  CSW spoke with Freda Munro of [employer] [facility] to request her assistance in finding placement for patient - Freda Munro to return call to CSW once information is obtained.  CSW attempted to reach Melissa at [facility] without success - a voicemail was left requesting a return call.  CSW spoke with Mindy at [facility] who is agreeable to review patient - CSW emailed patient's FL2 for review.  [name], MSW, LCSW Transitions of Care  Clinical Social Worker II [phone]

## 2021-06-09 NOTE — Progress Notes (Signed)
Family Medicine Teaching Service Daily Progress Note Intern Pager: [phone]  Patient name: Charles Fox Medical record number: [id number] Date of birth: 01-Jul-1951 Age: 70 y.o. Gender: male  Primary Care Provider: [name], MD Consultants: none Code Status: FULL  [name]:  8/19: Admitted for altered mental status and hematuria 8/20: Normal brain MRI and improved mentation  8/21: [facility] 12/30, determined to not have capacity 8/22: Discussion with son revealed patient does not have a safe place for discharge 8/23: PSA elevated.  Urology recommending keeping catheter and rechecking PSA in 1 month. 8/24: Voiding trial 8/25: Suicide precautions, voiding trial failed, Foley reinserted 8/26: Suicide precautions removed 8/29 agitated and combative, delirium precautions in place 9/5: AKI 9/9: AKI resolved   Assessment and Plan: The patient is a 70 year old male admitted for altered mental status and hematuria with concern for bladder malignancy.  Past medical history significant for hypertension, T2DM, HLD, tobacco use disorder, and homelessness.  Patient is medically stable and awaiting SNF placement.   Urinary retention, possible bladder malignancy Per nursing and patient, patient is voiding regularly. No need for repeat bladder scan.  - Continue Flomax 0.4 mg daily and finasteride -- Repeat PSA on 9/23 per urology - Urology outpatient follow-up   Dementia, intermittent agitation Stable. No agitation overnight.  - Continue Seroquel 100 mg nightly and ramelteon 8 mg nightly - Recommend neurology outpatient follow up for dementia   T2DM Stable. - Metformin 500 mg twice daily   Tobacco use - Nicotine patch 14 mg   FEN/GI: heart healthy carb modified diet, replete electrolytes as needed  PPx: Lovenox  Dispo: awaiting SNF placement   Subjective:  On interview this morning, [name] is doing well. He denies any complaints and denies any SoB,  CP, N/V.   Objective: Temp:  [97.7 F (36.5 C)-98.3 F (36.8 C)] 97.7 F (36.5 C) (09/21 0900) Pulse Rate:  [80] 80 (09/21 0458) Resp:  [17-18] 18 (09/21 0900) BP: (120-151)/(60-91) 139/84 (09/21 0900) SpO2:  [97 %-98 %] 97 % (09/21 0458) Physical Exam Vitals reviewed.  Cardiovascular:     Rate and Rhythm: Normal rate and regular rhythm.     Heart sounds: No murmur heard. Pulmonary:     Effort: Pulmonary effort is normal.     Breath sounds: Normal breath sounds.  Abdominal:     General: Bowel sounds are normal.     Palpations: Abdomen is soft.     Tenderness: There is no abdominal tenderness.  Neurological:     Mental Status: He is alert.     Laboratory: Recent Labs  Lab 06/07/21 0600  WBC 7.4  HGB 14.5  HCT 42.9  PLT 265   Recent Labs  Lab 06/05/21 0609  NA 138  K 3.8  CL 103  CO2 24  BUN 9  CREATININE 1.22  CALCIUM 8.9  GLUCOSE 86    Imaging/Diagnostic Tests: None new  Corky Sox PGY-1, Psychiatry FPTS Intern pager: [phone], text pages welcome

## 2021-06-09 NOTE — Progress Notes (Signed)
Patient refused 1700 doses of 3.125 mg carvedilol and 500 mg metformin. Notified Dr. Alvie Heidelberg, Family Medicine resident MD.  No new orders.

## 2021-06-09 NOTE — Progress Notes (Signed)
Occupational Therapy Treatment Patient Details Name: NGAI PARCELL MRN: [id number] DOB: September 08, 1951 Today's Date: 06/09/2021   History of present illness Pt 70 yr old male who presented on 05/07/21 with AMS and hematuria/urinary retention (foley placed 8/15), diagnosis of UTI. Noted per MD notes dementia with intermittent agitation. PMH includes diabetes, CVA  hypertension, hyperlipidemia, homelessness, tobacco use disorder.   OT comments  Patient received in bed and asking to shave.  Patient was provided set but did not like provided razors and stated he would wait till he had better ones.  Performed basic grooming standing at sink.  Functional mobility and transfer training performed with 4 wheel walker with education on safety with locking brakes and correct use.  Acute OT to continue to follow.    Recommendations for follow up therapy are one component of a multi-disciplinary discharge planning process, led by the attending physician.  Recommendations may be updated based on patient status, additional functional criteria and insurance authorization.    Follow Up Recommendations  SNF;Supervision/Assistance - 24 hour    Equipment Recommendations       Recommendations for Other Services      Precautions / Restrictions Precautions Precautions: Fall;Other (comment)       Mobility Bed Mobility Overal bed mobility: Modified Independent                  Transfers Overall transfer level: Needs assistance Equipment used: 4-wheeled walker Transfers: Sit to/from Stand Sit to Stand: Min guard         General transfer comment: cues for brakes on 4 wheel walker and proper use    Balance Overall balance assessment: Needs assistance Sitting-balance support: No upper extremity supported;Feet supported Sitting balance-Leahy Scale: Good Sitting balance - Comments: independent with sitting balance   Standing balance support: No upper extremity supported;During functional  activity Standing balance-Leahy Scale: Fair Standing balance comment: stands at sink without need for assistance for balance                           ADL either performed or assessed with clinical judgement   ADL Overall ADL's : Needs assistance/impaired     Grooming: Wash/dry hands;Wash/dry face;Supervision/safety Grooming Details (indicate cue type and reason): impulsive and required cues for safety                             Functional mobility during ADLs: Min guard General ADL Comments: min guard without AD in room     Vision       Perception     Praxis      Cognition Arousal/Alertness: Awake/alert Behavior During Therapy: WFL for tasks assessed/performed Overall Cognitive Status: No family/caregiver present to determine baseline cognitive functioning Area of Impairment: Safety/judgement;Memory;Problem solving                 Orientation Level: Disoriented to;Situation Current Attention Level: Selective Memory: Decreased short-term memory   Safety/Judgement: Decreased awareness of safety;Decreased awareness of deficits Awareness: Intellectual Problem Solving: Requires verbal cues;Slow processing General Comments: requires frequent cues for safety        Exercises     Shoulder Instructions       General Comments      Pertinent Vitals/ Pain       Pain Assessment: Faces Faces Pain Scale: Hurts a little bit Pain Location: back Pain Descriptors / Indicators: Aching Pain Intervention(s): Repositioned  Home Living  Prior Functioning/Environment              Frequency  Min 2X/week        Progress Toward Goals  OT Goals(current goals can now be found in the care plan section)  Progress towards OT goals: Progressing toward goals  Acute Rehab OT Goals Patient Stated Goal: go for a walk OT Goal Formulation: With patient Time For Goal Achievement:  05/24/21 Potential to Achieve Goals: Fair ADL Goals Pt Will Perform Grooming: with modified independence;standing Pt Will Perform Upper Body Bathing: with modified independence;sitting Pt Will Perform Lower Body Bathing: with modified independence;sit to/from stand Pt Will Perform Upper Body Dressing: with modified independence;sitting Pt Will Perform Lower Body Dressing: with modified independence;sit to/from stand Pt Will Transfer to Toilet: with modified independence;ambulating Pt Will Perform Toileting - Clothing Manipulation and hygiene: with modified independence;sit to/from stand  Plan Discharge plan remains appropriate    Co-evaluation                 AM-PAC OT "6 Clicks" Daily Activity     Outcome Measure   Help from another person eating meals?: None Help from another person taking care of personal grooming?: A Little Help from another person toileting, which includes using toliet, bedpan, or urinal?: A Little Help from another person bathing (including washing, rinsing, drying)?: A Little Help from another person to put on and taking off regular upper body clothing?: A Little Help from another person to put on and taking off regular lower body clothing?: A Little 6 Click Score: 19    End of Session Equipment Utilized During Treatment: Gait belt;Rolling walker (4 wheel walker)  OT Visit Diagnosis: Unsteadiness on feet (R26.81)   Activity Tolerance Patient tolerated treatment well   Patient Left in bed;with call bell/phone within reach   Nurse Communication Mobility status        Time: 0712-1975 OT Time Calculation (min): 19 min  Charges: OT General Charges $OT Visit: 1 Visit OT Treatments $Self Care/Home Management : 8-22 mins  Lodema Hong, OTA   Monty Spicher [name] 06/09/2021, 11:25 AM

## 2021-06-09 NOTE — Progress Notes (Signed)
FPTS Brief Progress Note  S:Patient awake and lying comfortably in bed. No complaints.   O: BP 135/83 (BP Location: Right Arm)   Pulse 84   Temp 97.9 F (36.6 C) (Axillary)   Resp 18   Ht 5\' 8"  (1.727 m)   Wt 101.2 kg   SpO2 99%   BMI 33.92 kg/m     A/P: - Orders reviewed. Labs for AM not ordered, which was adjusted as needed. Labs are ordered weekly for patient.    Holley Bouche, MD 06/09/2021, 10:40 PM PGY-1, [facility] Night Resident  Please page [phone] with questions.

## 2021-06-10 ENCOUNTER — Inpatient Hospital Stay: Payer: Medicare HMO

## 2021-06-10 DIAGNOSIS — R4182 Altered mental status, unspecified: Secondary | ICD-10-CM | POA: Diagnosis not present

## 2021-06-10 NOTE — Plan of Care (Signed)
  Problem: Education: Goal: Knowledge of General Education information will improve Description: Including pain rating scale, medication(s)/side effects and non-pharmacologic comfort measures Outcome: Progressing   Problem: Health Behavior/Discharge Planning: Goal: Ability to manage health-related needs will improve Outcome: Progressing   Problem: Clinical Measurements: Goal: Ability to maintain clinical measurements within normal limits will improve Outcome: Progressing Goal: Will remain free from infection Outcome: Progressing Goal: Diagnostic test results will improve Outcome: Progressing Goal: Respiratory complications will improve Outcome: Progressing Goal: Cardiovascular complication will be avoided Outcome: Progressing   Problem: Elimination: Goal: Will not experience complications related to bowel motility Outcome: Progressing Goal: Will not experience complications related to urinary retention Outcome: Progressing   Problem: Activity: Goal: Risk for activity intolerance will decrease Outcome: Progressing

## 2021-06-10 NOTE — Plan of Care (Signed)
The patient was assessed for capacity using the four criteria outlined by Appelbaum. He was found to lack capacity to make medical decisions.   Appreciation The patient does not have an appreciation of his current medical situation as demonstrated by a lack of insight into his medical conditions. He is unable to name his medical conditions and when reminded of them he is unable to repeat the information back.   Understanding The patient is unable to understand the relevant information, as indicated by his inability to paraphrase previously described medical information.   Communication The patient is able to communicate a preference and treatment choice clearly.   Reasoning The patient does not have the ability to reason regarding medical decision as evidenced by an inability to rationally manipulate information. When given contextual information and then asked what would happen if he were to stop taking his medications, he is unable to provide a reasonable answer.   Conclusion: this patient does not have capacity to make medical decisions   Corky Sox PGY-1, Psychiatry FPTS Intern pager: [phone], text pages welcome

## 2021-06-10 NOTE — Progress Notes (Signed)
Family Medicine Teaching Service Daily Progress Note Intern Pager: [phone]  Patient name: Charles Fox Medical record number: [id number] Date of birth: 1951/01/30 Age: 70 y.o. Gender: male  Primary Care Provider: [name], MD Consultants: None Code Status: FULL  [name]:  8/19: Admitted for altered mental status and hematuria 8/20: Normal brain MRI and improved mentation  8/21: [facility] 12/30, determined to not have capacity 8/22: Discussion with son revealed patient does not have a safe place for discharge 8/23: PSA elevated.  Urology recommending keeping catheter and rechecking PSA in 1 month. 8/24: Voiding trial 8/25: Suicide precautions, voiding trial failed, Foley reinserted 8/26: Suicide precautions removed 8/29 agitated and combative, delirium precautions in place 9/5: AKI 9/9: AKI resolved   Assessment and Plan: The patient is a 70 year old male admitted for altered mental status and hematuria with concern for bladder malignancy.  Past medical history significant for hypertension, T2DM, HLD, tobacco use disorder, and homelessness.  Patient is medically stable and awaiting SNF placement.   Urinary retention, possible bladder malignancy Patient reports voiding regularly. No concerns at this time.  - Continue Flomax 0.4 mg daily and finasteride -- Repeat PSA on 9/23 per urology - Urology outpatient follow-up   Dementia, intermittent agitation Patient more irritable this morning, but redirectable and not violent.  - Continue Seroquel 100 mg nightly and ramelteon 8 mg nightly - Recommend neurology outpatient follow up for dementia   T2DM Stable. - Metformin 500 mg twice daily   Tobacco use - Nicotine patch 14 mg   FEN/GI: heart healthy carb modified diet PPx: Lovenox  Dispo: awaiting SNF placement   Subjective:  Patient is more irritable this morning than previous days. He denies complaints, however, and specifically denies chest  pain, SoB, N/V.   Objective: Temp:  [97.7 F (36.5 C)-98.3 F (36.8 C)] 97.7 F (36.5 C) (09/22 1154) Pulse Rate:  [73-90] 90 (09/22 1154) Resp:  [14-18] 18 (09/22 1154) BP: (108-150)/(63-83) 113/63 (09/22 1154) SpO2:  [96 %-99 %] 97 % (09/22 0743) Physical Exam Vitals reviewed.  Cardiovascular:     Rate and Rhythm: Normal rate and regular rhythm.     Pulses: Normal pulses.     Heart sounds: No murmur heard. Pulmonary:     Effort: Pulmonary effort is normal.     Breath sounds: Normal breath sounds.  Abdominal:     General: Bowel sounds are normal.     Palpations: Abdomen is soft.     Tenderness: There is no abdominal tenderness.  Neurological:     Mental Status: He is alert.     Laboratory: Recent Labs  Lab 06/07/21 0600  WBC 7.4  HGB 14.5  HCT 42.9  PLT 265   Recent Labs  Lab 06/05/21 0609  NA 138  K 3.8  CL 103  CO2 24  BUN 9  CREATININE 1.22  CALCIUM 8.9  GLUCOSE 86    Imaging/Diagnostic Tests: None new   Corky Sox PGY-1, Psychiatry FPTS Intern pager: [phone], text pages welcome

## 2021-06-10 NOTE — Progress Notes (Signed)
Pt getting more agitated now about not having his truck and wanting to leave. Pt walked out of room into hallway yelling. Rn redirected to room and attempted to call son so pt could speak with but unable to reach at this time. Pt sitting on rollator walker and is refusing to get either to chair or bed. Non-skid socks are in place. Pt also refusing meds. Will continue to monitor.

## 2021-06-11 DIAGNOSIS — N179 Acute kidney failure, unspecified: Secondary | ICD-10-CM | POA: Diagnosis not present

## 2021-06-11 DIAGNOSIS — N320 Bladder-neck obstruction: Secondary | ICD-10-CM | POA: Diagnosis not present

## 2021-06-11 DIAGNOSIS — R4182 Altered mental status, unspecified: Secondary | ICD-10-CM | POA: Diagnosis not present

## 2021-06-11 LAB — PSA: Prostatic Specific Antigen: 4.79 ng/mL — ABNORMAL HIGH (ref 0.00–4.00)

## 2021-06-11 MED ORDER — CARVEDILOL 3.125 MG PO TABS
3.1250 mg | ORAL_TABLET | Freq: Two times a day (BID) | ORAL | Status: DC
  Administered 2021-06-11 – 2021-09-24 (×211): 3.125 mg via ORAL
  Filled 2021-06-11 (×211): qty 1

## 2021-06-11 MED ORDER — METFORMIN HCL ER 500 MG PO TB24
500.0000 mg | ORAL_TABLET | Freq: Two times a day (BID) | ORAL | Status: DC
  Administered 2021-06-11 – 2021-09-24 (×210): 500 mg via ORAL
  Filled 2021-06-11 (×212): qty 1

## 2021-06-11 NOTE — Progress Notes (Signed)
OT TREATMENT Pt. Has made steady gains in therapy. Pt. Was cooperative but confused during session. Pt. Was able to follow 1 step commands consistently. Acute OT to follow.    06/11/21 1400  OT Visit Information  Last OT Received On 06/11/21  Assistance Needed +1  History of Present Illness Pt 70 yr old male who presented on 05/07/21 with AMS and hematuria/urinary retention (foley placed 8/15), diagnosis of UTI. Noted per MD notes dementia with intermittent agitation. PMH includes diabetes, CVA  hypertension, hyperlipidemia, homelessness, tobacco use disorder.  Precautions  Precautions Fall  Pain Assessment  Pain Assessment No/denies pain  Cognition  Arousal/Alertness Awake/alert  Behavior During Therapy WFL for tasks assessed/performed  Overall Cognitive Status No family/caregiver present to determine baseline cognitive functioning  Area of Impairment Safety/judgement;Memory;Problem solving  Orientation Level Disoriented to;Situation  Memory Decreased short-term memory  Safety/Judgement Decreased awareness of safety;Decreased awareness of deficits  Problem Solving Requires verbal cues;Slow processing  Upper Extremity Assessment  Upper Extremity Assessment Generalized weakness  ADL  Overall ADL's  Needs assistance/impaired  Eating/Feeding Modified independent;Sitting  Grooming Wash/dry hands;Wash/dry face;Supervision/safety;Standing  Upper Body Bathing Supervision/ safety;Sitting  Lower Body Bathing Supervison/ safety;Sit to/from stand  Upper Body Dressing  Supervision/safety  Lower Body Dressing Supervision/safety;Sit to/from Electronics engineer- Technical sales engineer;Sit to/from stand  Functional mobility during ADLs Supervision/safety;Rolling walker  General ADL Comments Pt. was able to sit eob for adls.  Bed Mobility  Overal bed mobility Modified Independent  Balance  Sitting balance-Leahy Scale Good   Standing balance-Leahy Scale Fair  Restrictions  Weight Bearing Restrictions No  Vision- Assessment  Vision Assessment? No apparent visual deficits  Transfers  Overall transfer level Needs assistance  Equipment used 4-wheeled walker  Transfers Sit to/from Stand  Sit to Stand Supervision  Stand pivot transfers Supervision  General transfer comment cues to lock brakes.  OT - End of Session  Equipment Utilized During Treatment Gait belt;Rolling walker  Activity Tolerance Patient tolerated treatment well  Patient left in bed;with call bell/phone within reach  Nurse Communication  (ok therapy)  OT Assessment/Plan  OT Plan Discharge plan remains appropriate  OT Visit Diagnosis Unsteadiness on feet (R26.81)  OT Frequency (ACUTE ONLY) Min 2X/week  Follow Up Recommendations SNF;Supervision/Assistance - 24 hour  OT Equipment None recommended by OT (to be determined at dc location)  AM-PAC OT "6 Clicks" Daily Activity Outcome Measure (Version 2)  Help from another person eating meals? 4  Help from another person taking care of personal grooming? 3  Help from another person toileting, which includes using toliet, bedpan, or urinal? 3  Help from another person bathing (including washing, rinsing, drying)? 3  Help from another person to put on and taking off regular upper body clothing? 3  Help from another person to put on and taking off regular lower body clothing? 3  6 Click Score 19  Progressive Mobility  What is the highest level of mobility based on the progressive mobility assessment? Level 4 (Walks with assist in room) - Balance while marching in place and cannot step forward and back - Complete  Mobility Ambulated with assistance in room  OT Goal Progression  Progress towards OT goals Progressing toward goals  Acute Rehab OT Goals  Patient Stated Goal get out of here  OT Goal Formulation With patient  Time For Goal Achievement 06/21/21  Potential to Achieve Goals Fair  ADL  Goals  Pt Will Perform Grooming with modified independence;standing  Pt Will  Perform Upper Body Bathing with modified independence;sitting  Pt Will Perform Lower Body Bathing with modified independence;sit to/from stand  Pt Will Perform Upper Body Dressing with modified independence;sitting  Pt Will Perform Lower Body Dressing with modified independence;sit to/from stand  Pt Will Transfer to Toilet with modified independence;ambulating  Pt Will Perform Toileting - Clothing Manipulation and hygiene with modified independence;sit to/from stand  OT Time Calculation  OT Start Time (ACUTE ONLY) 1400  OT Stop Time (ACUTE ONLY) 1428  OT Time Calculation (min) 28 min  OT General Charges  $OT Visit 1 Visit  OT Treatments  $Self Care/Home Management  23-37 mins  Reece Packer OT/L

## 2021-06-11 NOTE — Progress Notes (Addendum)
Family Medicine Teaching Service Daily Progress Note Intern Pager: [phone]  Patient name: Charles Fox Medical record number: [id number] Date of birth: Jul 22, 1951 Age: 70 y.o. Gender: male  Primary Care Provider: [name], MD Consultants: none Code Status: FULL  [name]:  8/19: Admitted for altered mental status and hematuria 8/20: Normal brain MRI and improved mentation  8/21: [facility] 12/30, determined to not have capacity 8/22: Discussion with son revealed patient does not have a safe place for discharge 8/23: PSA elevated.  Urology recommending keeping catheter and rechecking PSA in 1 month. 8/24: Voiding trial 8/25: Suicide precautions, voiding trial failed, Foley reinserted 8/26: Suicide precautions removed 8/29 agitated and combative, delirium precautions in place 9/5: AKI 9/9: AKI resolved  Assessment and Plan: The patient is a 70 year old male admitted for altered mental status and hematuria with concern for bladder malignancy.  Past medical history significant for hypertension, T2DM, HLD, tobacco use disorder, and homelessness.  Patient is medically stable and awaiting SNF placement.   Urinary retention, possible bladder malignancy Patient reports voiding regularly. No suprapubic fullness. No concerns at this time. PSA of 4.8 this AM.  -- Low threshold to bladder scan - Continue Flomax 0.4 mg daily and finasteride - Urology outpatient follow-up   Dementia, intermittent agitation Patient alert and oriented x2, baseline - Continue Seroquel 100 mg nightly and ramelteon 8 mg nightly - Recommend neurology outpatient follow up for dementia   T2DM Stable. Patient has been refusing evening dose. - Metformin 500 mg twice daily (switch to 1 PM second dose)   Hypertension Patient has been refusing evening dose of Coreg.  -Continue home losartan 50 mg daily -Continue home Coreg 3.125 mg BID (switch to 1 PM second dose)  Tobacco use -  Nicotine patch 14 mg   FEN/GI: heart healthy carb modified diet PPx: Lovenox  Dispo: awaiting SNF placement, APS guardianship case pending  Subjective:  On interview this morning the patient is pleasant. He denies complaints at this time. ROS negative for CP, SoB, N/V.   Objective: Temp:  [97.4 F (36.3 C)-98.3 F (36.8 C)] 97.5 F (36.4 C) (09/23 0607) Pulse Rate:  [80-90] 86 (09/23 0607) Resp:  [15-18] 18 (09/23 0607) BP: (109-150)/(62-85) 150/69 (09/23 0607) SpO2:  [97 %-100 %] 100 % (09/23 3846) Physical Exam Vitals reviewed.  Cardiovascular:     Rate and Rhythm: Normal rate and regular rhythm.     Heart sounds: No murmur heard. Pulmonary:     Effort: Pulmonary effort is normal.     Breath sounds: Normal breath sounds.  Abdominal:     General: Bowel sounds are normal.     Palpations: Abdomen is soft.     Tenderness: There is no abdominal tenderness.  Neurological:     Mental Status: He is alert.     Laboratory: Recent Labs  Lab 06/07/21 0600  WBC 7.4  HGB 14.5  HCT 42.9  PLT 265   Recent Labs  Lab 06/05/21 0609  NA 138  K 3.8  CL 103  CO2 24  BUN 9  CREATININE 1.22  CALCIUM 8.9  GLUCOSE 86     Imaging/Diagnostic Tests: PSA as above   Corky Sox PGY-1, Psychiatry FPTS Intern pager: [phone], text pages welcome

## 2021-06-11 NOTE — Plan of Care (Signed)
  Problem: Education: Goal: Knowledge of General Education information will improve Description Including pain rating scale, medication(s)/side effects and non-pharmacologic comfort measures Outcome: Progressing   Problem: Health Behavior/Discharge Planning: Goal: Ability to manage health-related needs will improve Outcome: Progressing   

## 2021-06-11 NOTE — Progress Notes (Signed)
FPTS Brief Progress Note  S:Patient awake and resting comfortably in bed.   O: BP 120/85 (BP Location: Left Arm)   Pulse 89   Temp 98.3 F (36.8 C) (Oral)   Resp 18   Ht 5\' 8"  (1.727 m)   Wt 101.2 kg   SpO2 97%   BMI 33.92 kg/m     A/P: - Orders reviewed. Labs for AM not ordered, which was adjusted as needed. Patient receives labs weekly.  Holley Bouche, MD 06/11/2021, 12:05 AM PGY-1, [facility] Night Resident  Please page [phone] with questions.

## 2021-06-12 DIAGNOSIS — N179 Acute kidney failure, unspecified: Secondary | ICD-10-CM | POA: Diagnosis not present

## 2021-06-12 DIAGNOSIS — N320 Bladder-neck obstruction: Secondary | ICD-10-CM | POA: Diagnosis not present

## 2021-06-12 DIAGNOSIS — R4182 Altered mental status, unspecified: Secondary | ICD-10-CM | POA: Diagnosis not present

## 2021-06-12 NOTE — Progress Notes (Signed)
FPTS Brief Progress Note  S:Patient resting comfortably in bed.   O: BP 116/60 (BP Location: Right Arm)   Pulse 70   Temp 98 F (36.7 C) (Oral)   Resp 16   Ht 5\' 8"  (1.727 m)   Wt 101.2 kg   SpO2 98%   BMI 33.92 kg/m     A/P: - Orders reviewed. Labs for AM not ordered, which was adjusted as needed. Patient receives labs weekly.  Holley Bouche, MD 06/12/2021, 1:01 AM PGY-1, [facility] Night Resident  Please page [phone] with questions.

## 2021-06-12 NOTE — Progress Notes (Signed)
Family Medicine Teaching Service Daily Progress Note Intern Pager: [phone]  Patient name: Charles Fox Medical record number: [id number] Date of birth: 1950-11-23 Age: 70 y.o. Gender: male  Primary Care Provider: [name], MD Consultants: None Code Status: Full  [name]:  8/19: Admitted for altered mental status and hematuria 8/20: Normal brain MRI and improved mentation  8/21: [facility] 12/30, determined to not have capacity 8/22: Discussion with son revealed patient does not have a safe place for discharge 8/23: PSA elevated.  Urology recommending keeping catheter and rechecking PSA in 1 month. 8/24: Voiding trial 8/25: Suicide precautions, voiding trial failed, Foley reinserted 8/26: Suicide precautions removed 8/29 agitated and combative, delirium precautions in place 9/5: AKI 9/9: AKI resolved  Assessment and Plan: The patient is a 70 year old male admitted for altered mental status and hematuria with concern for bladder malignancy.  Past medical history significant for hypertension, T2DM, HLD, tobacco use disorder, and homelessness.  Patient is medically stable and awaiting SNF placement.  Urinary retention, possible bladder malignancy -Continue Flomax 0.4 mg daily and finasteride 5 mg -Urology outpatient follow-up   Dementia, intermittent agitation -Continue Seroquel 100 mg nightly and ramelteon 8 mg nightly -Recommend neurology outpatient follow-up for dementia  T2DM (Stable) -Metformin 500 mg twice daily  Hypertension -Continue home losartan 50 mg daily -Continue home Coreg 3.125 mg twice daily  Tobacco use -Nicotine patch 14 mg  FEN/GI: Heart Healthy Carb Modified diet PPx: Lovenox Dispo: Awaiting SNF placement   APS guardianship case pending . [name]   Subjective:  Patient is sleepy this morning, with confusion on as to why he's in the hospital. Explained to patient why he was is the hospital  Objective: Temp:  [97.4 F (36.3  C)-98.3 F (36.8 C)] 98 F (36.7 C) (09/24 0427) Pulse Rate:  [70-76] 70 (09/24 0427) Resp:  [16-18] 17 (09/24 0427) BP: (94-130)/(47-70) 116/50 (09/24 0427) SpO2:  [96 %-98 %] 98 % (09/24 0427) Physical Exam: General: Well appearing, obese, African American male Cardiovascular: RRR, [place] Respiratory: CTABL, no wheezes or stridor Abdomen: Soft, non-tender, non-distended, no suprapubic tenderness Extremities: No edema, moving all extremities independently  Laboratory: Recent Labs  Lab 06/07/21 0600  WBC 7.4  HGB 14.5  HCT 42.9  PLT 265   No results for input(s): NA, K, CL, CO2, BUN, CREATININE, CALCIUM, PROT, BILITOT, ALKPHOS, ALT, AST, GLUCOSE in the last 168 hours.  Invalid input(s): LABALBU    Imaging/Diagnostic Tests:   Holley Bouche, MD 06/12/2021, 6:34 AM PGY-1, [place] Intern pager: [phone], text pages welcome

## 2021-06-12 NOTE — Progress Notes (Signed)
Family Medicine Teaching Service Daily Progress Note Intern Pager: [phone]  Patient name: Charles Fox Medical record number: [id number] Date of birth: 1951/02/18 Age: 70 y.o. Gender: male  Primary Care Provider: [name], MD Consultants: None   Code Status: Full Code  [name]:  8/19: Admitted for altered mental status and hematuria 8/20: Normal brain MRI and improved mentation  8/21: [facility] 12/30, determined to not have capacity 8/22: Discussion with son revealed patient does not have a safe place for discharge 8/23: PSA elevated.  Urology recommending keeping catheter and rechecking PSA in 1 month. 8/24: Voiding trial 8/25: Suicide precautions, voiding trial failed, Foley reinserted 8/26: Suicide precautions removed 8/29 agitated and combative, delirium precautions in place 9/5: AKI 9/9: AKI resolved  Assessment and Plan: Charles Fox is a 70 y.o. male admitted for altered mental status and hematuria with concern for bladder malignancy.  Past medical history significant for hypertension, T2DM, HLD, tobacco use disorder, and homelessness.  Patient is medically stable and awaiting SNF placement.  Urinary retention Possible bladder malignancy. - continue tamsulosin and finasteride - urology outpatient follow-up  Dementia With intermittent agitation. - quetiapine 100 mg qhs - ramelteon 8 mg qhs - recommend neurology outpatient follow-up for dementia  T2DM Stable. - metformin 500 mg daily  HTN Well-controlled. - continue losartan 50 mg daily - continue carvedilol 3.125 mg BID  Tobacco use - nicotine patch 7 mg  FEN/GI: Heart healthy/carb modified diet PPx: Enoxaparin  Disposition: Awaiting SNF placement, APS guardianship case pending  Subjective:  NAOE.  Patient is resting comfortably in bed.  He expresses concern that he will die if his blood pressure is not controlled.  Reassured patient that we are monitoring his blood  pressure.  Objective: Temp:  [98 F (36.7 C)-98.1 F (36.7 C)] 98 F (36.7 C) (09/25 0609) Pulse Rate:  [64-75] 64 (09/25 0609) Resp:  [16-18] 16 (09/25 0609) BP: (115-139)/(72-77) 115/72 (09/25 0609) SpO2:  [97 %-98 %] 98 % (09/25 0609) Weight:  [101.8 kg] 101.8 kg (09/25 0609) Physical Exam: General: Alert, pleasant, NAD Cardiovascular: RRR, no murmurs Respiratory: Clear to auscultation bilaterally, no respiratory distress Abdomen: Soft, nontender, positive bowel sounds Extremities: Warm and well perfused, no edema  Laboratory: Recent Labs  Lab 06/07/21 0600  WBC 7.4  HGB 14.5  HCT 42.9  PLT 265   No results for input(s): NA, K, CL, CO2, BUN, CREATININE, CALCIUM, PROT, BILITOT, ALKPHOS, ALT, AST, GLUCOSE in the last 168 hours.  Invalid input(s): LABALBU    Imaging/Diagnostic Tests: No new imaging.  [name], MD 06/13/2021, 6:47 AM PGY-2, [place] Intern pager: [phone], text pages welcome

## 2021-06-13 DIAGNOSIS — R4182 Altered mental status, unspecified: Secondary | ICD-10-CM | POA: Diagnosis not present

## 2021-06-13 DIAGNOSIS — N320 Bladder-neck obstruction: Secondary | ICD-10-CM | POA: Diagnosis not present

## 2021-06-13 DIAGNOSIS — N179 Acute kidney failure, unspecified: Secondary | ICD-10-CM | POA: Diagnosis not present

## 2021-06-13 NOTE — Progress Notes (Signed)
FPTS Interim Night Progress Note  S:Patient sleeping comfortably.  Rounded with primary night RN.  No concerns voiced.  No orders required.    O: Today's Vitals   06/12/21 0427 06/12/21 0730 06/12/21 0752 06/12/21 1151  BP: (!) 116/50  133/77 139/75  Pulse: 70  75 67  Resp: 17  18 18   Temp: 98 F (36.7 C)  98.1 F (36.7 C) 98 F (36.7 C)  TempSrc: Oral  Oral Oral  SpO2: 98%  97%   Weight:      Height:      PainSc:  0-No pain        A/P: Continue current management  Carollee Leitz MD PGY-3, [place] [facility] Service pager [phone]

## 2021-06-14 DIAGNOSIS — R4182 Altered mental status, unspecified: Secondary | ICD-10-CM | POA: Diagnosis not present

## 2021-06-14 DIAGNOSIS — F0281 Dementia in other diseases classified elsewhere with behavioral disturbance: Secondary | ICD-10-CM | POA: Diagnosis not present

## 2021-06-14 DIAGNOSIS — N179 Acute kidney failure, unspecified: Secondary | ICD-10-CM | POA: Diagnosis not present

## 2021-06-14 DIAGNOSIS — G308 Other Alzheimer's disease: Secondary | ICD-10-CM | POA: Diagnosis not present

## 2021-06-14 LAB — CBC
HCT: 43.2 % (ref 39.0–52.0)
Hemoglobin: 13.9 g/dL (ref 13.0–17.0)
MCH: 28.5 pg (ref 26.0–34.0)
MCHC: 32.2 g/dL (ref 30.0–36.0)
MCV: 88.5 fL (ref 80.0–100.0)
Platelets: 277 10*3/uL (ref 150–400)
RBC: 4.88 MIL/uL (ref 4.22–5.81)
RDW: 14.1 % (ref 11.5–15.5)
WBC: 7.5 10*3/uL (ref 4.0–10.5)
nRBC: 0 % (ref 0.0–0.2)

## 2021-06-14 NOTE — Progress Notes (Addendum)
Family Medicine Teaching Service Daily Progress Note Intern Pager: [phone]  Patient name: Charles Fox Medical record number: [id number] Date of birth: [birth year] Age: 70 y.o. Gender: male  Primary Care Provider: [name], MD Consultants: none Code Status: FULL  [name]:  8/19: Admitted for altered mental status and hematuria 8/20: Normal brain MRI and improved mentation  8/21: [facility] 12/30, determined to not have capacity 8/22: Discussion with son revealed patient does not have a safe place for discharge 8/23: PSA elevated.  Urology recommending keeping catheter and rechecking PSA in 1 month. 8/24: Voiding trial 8/25: Suicide precautions, voiding trial failed, Foley reinserted 8/26: Suicide precautions removed 8/29 agitated and combative, delirium precautions in place 9/5: AKI 9/9: AKI resolved  Assessment and Plan: Charles Fox is a 70 y.o. male admitted for altered mental status and hematuria with concern for bladder malignancy.  Past medical history significant for hypertension, T2DM, HLD, tobacco use disorder, and homelessness.  Patient is medically stable and awaiting SNF placement.   Urinary retention Possible bladder malignancy. Patient reports voiding well and denies suprapubic fullness.  - continue tamsulosin and finasteride - urology outpatient follow-up   Dementia With intermittent agitation. - quetiapine 100 mg qhs - ramelteon 8 mg qhs - recommend neurology outpatient follow-up for dementia   T2DM Stable. - metformin 500 mg daily   HTN Well-controlled. - continue losartan 50 mg daily - continue carvedilol 3.125 mg BID   Tobacco use - nicotine patch 7 mg   FEN/GI: Heart healthy/carb modified diet PPx: Enoxaparin Dispo: awaiting SNF placement, APS guardianship pending   Subjective:  On interview this morning Charles Fox is alert and oriented x2. He has no complaints and denies CP, SoB, N/V.   Objective: Temp:   [97.8 F (36.6 C)-98.2 F (36.8 C)] 97.8 F (36.6 C) (09/26 0807) Resp:  [16] 16 (09/26 0807) BP: (126-128)/(74-76) 128/76 (09/26 0807) SpO2:  [100 %] 100 % (09/26 1572) Physical Exam Vitals reviewed.  Cardiovascular:     Rate and Rhythm: Normal rate and regular rhythm.     Pulses: Normal pulses.     Heart sounds: No murmur heard. Pulmonary:     Effort: Pulmonary effort is normal.     Breath sounds: Normal breath sounds.  Abdominal:     General: Bowel sounds are normal.     Palpations: Abdomen is soft.     Tenderness: There is no abdominal tenderness.  Neurological:     Mental Status: He is alert.     Laboratory: Recent Labs  Lab 06/14/21 0551  WBC 7.5  HGB 13.9  HCT 43.2  PLT 277   No results for input(s): NA, K, CL, CO2, BUN, CREATININE, CALCIUM, PROT, BILITOT, ALKPHOS, ALT, AST, GLUCOSE in the last 168 hours.  Invalid input(s): LABALBU  Corky Sox PGY-1, Psychiatry FPTS Intern pager: [phone], text pages welcome

## 2021-06-14 NOTE — Progress Notes (Signed)
FPTS Brief Progress Note  S: Sleeping.    O: BP 126/74 (BP Location: Left Arm)   Pulse 64   Temp 98.2 F (36.8 C) (Oral)   Resp 16   Ht 5\' 8"  (1.727 m)   Wt 101.8 kg   SpO2 98%   BMI 34.12 kg/m   General: Sleeping, no acute distress. Age appropriate. Respiratory: normal effort   A/P: AMS  Worsening dementia/Alzheimer's  Temporary suicidality and agitation - Orders reviewed. Labs for AM ordered, which was adjusted as needed.   Gerlene Fee, DO 06/14/2021, 3:00 AM PGY-3, [facility] Night Resident  Please page [phone] with questions.

## 2021-06-15 DIAGNOSIS — F0281 Dementia in other diseases classified elsewhere with behavioral disturbance: Secondary | ICD-10-CM | POA: Diagnosis not present

## 2021-06-15 DIAGNOSIS — R4182 Altered mental status, unspecified: Secondary | ICD-10-CM | POA: Diagnosis not present

## 2021-06-15 DIAGNOSIS — N179 Acute kidney failure, unspecified: Secondary | ICD-10-CM | POA: Diagnosis not present

## 2021-06-15 DIAGNOSIS — G308 Other Alzheimer's disease: Secondary | ICD-10-CM | POA: Diagnosis not present

## 2021-06-15 NOTE — Progress Notes (Signed)
FPTS Interim Progress Note  S:Patient sleeping in bed. Discussed with tech no acute concerns  O: BP 135/79 (BP Location: Left Arm)   Pulse 64   Temp 97.8 F (36.6 C) (Oral)   Resp 17   Ht 5\' 8"  (1.727 m)   Wt 101.8 kg   SpO2 97%   BMI 34.12 kg/m    Gen: NAD, sleeping in bed Resp: normal effort  A/P: Urinary Retention AMS  Worsening dementia/Alzheimer's  Temporary suicidality and agitation -Labs and orders reviewed -stable, awaiting placement    Gerrit Heck, MD 06/15/2021, 12:05 AM PGY-1, [place] Medicine Service pager [phone]

## 2021-06-15 NOTE — Progress Notes (Signed)
Family Medicine Teaching Service Daily Progress Note Intern Pager: [phone]  Patient name: Charles Fox Medical record number: [id number] Date of birth: [birth year] Age: 70 y.o. Gender: male  Primary Care Provider: [name], MD Consultants: none Code Status: FULL   [name]:  8/19: Admitted for altered mental status and hematuria 8/20: Normal brain MRI and improved mentation  8/21: [facility] 12/30, determined to not have capacity 8/22: Discussion with son revealed patient does not have a safe place for discharge 8/23: PSA elevated.  Urology recommending keeping catheter and rechecking PSA in 1 month. 8/24: Voiding trial 8/25: Suicide precautions, voiding trial failed, Foley reinserted 8/26: Suicide precautions removed 8/29 agitated and combative, delirium precautions in place 9/5: AKI 9/9: AKI resolved   Assessment and Plan: ANANTH FIALLOS is a 70 y.o. male admitted for altered mental status and hematuria with concern for bladder malignancy.  Past medical history significant for hypertension, T2DM, HLD, tobacco use disorder, and homelessness.  Patient is medically stable and awaiting SNF placement.   Urinary retention Possible bladder malignancy. Patient reports voiding well and denies suprapubic fullness.  - continue tamsulosin and finasteride - urology outpatient follow-up   Dementia With intermittent agitation. - quetiapine 100 mg qhs - ramelteon 8 mg qhs - recommend neurology outpatient follow-up for dementia   T2DM Stable. - metformin 500 mg daily   HTN Well-controlled. One soft BP this AM. - continue to monitor - encourage PO fluids with nursing - continue losartan 50 mg daily - continue carvedilol 3.125 mg BID   Tobacco use - nicotine patch 7 mg - should be able to discontinue this weekend   FEN/GI: Heart healthy/carb modified diet PPx: Enoxaparin Dispo: awaiting SNF placement, APS guardianship pending  Subjective:  On  interview this morning, the patient is pleasant. He denies having any pain, no chest pain, SoB, N/V.   Objective: Temp:  [97.8 F (36.6 C)-98.5 F (36.9 C)] 98.4 F (36.9 C) (09/27 0828) Resp:  [17-18] 17 (09/27 0828) BP: (100-151)/(55-79) 100/55 (09/27 0828) SpO2:  [97 %-99 %] 99 % (09/27 3267) Physical Exam Vitals reviewed.  Cardiovascular:     Rate and Rhythm: Normal rate and regular rhythm.     Pulses: Normal pulses.     Heart sounds: No murmur heard. Pulmonary:     Effort: Pulmonary effort is normal.     Breath sounds: Normal breath sounds.  Abdominal:     General: Bowel sounds are normal.     Palpations: Abdomen is soft.     Tenderness: There is no abdominal tenderness.  Neurological:     Mental Status: He is alert.     Laboratory: Recent Labs  Lab 06/14/21 0551  WBC 7.5  HGB 13.9  HCT 43.2  PLT 277   No results for input(s): NA, K, CL, CO2, BUN, CREATININE, CALCIUM, PROT, BILITOT, ALKPHOS, ALT, AST, GLUCOSE in the last 168 hours.  Invalid input(s): LABALBU   Imaging/Diagnostic Tests: None new   Corky Sox PGY-1, Psychiatry FPTS Intern pager: [phone], text pages welcome

## 2021-06-15 NOTE — Progress Notes (Addendum)
11:15am: CSW received confirmation from [place] to proceed with submitting documentation to [facility] legal representative for review.  9:45am: CSW attempted to reach patient's son again without success - no voicemail option is available.  CSW spoke with Bubba Hales of [place] [facility] who is in agreement that a petition needs to be filed for DSS to assume guardianship of this patient.  CSW will completed forms and will discuss next steps with TOC leadership.  [name], MSW, LCSW Transitions of Care  Clinical Social Worker II [phone]

## 2021-06-15 NOTE — Progress Notes (Signed)
PT Cancellation Note  Patient Details Name: Charles Fox MRN: [id number] DOB: 25-Jun-1951   Cancelled Treatment:    Reason Eval/Treat Not Completed: Patient declined, no reason specified  Stating he already walked this morning and just doesn't feel like walking right now. Unable to convince pt.   Arby Barrette, PT Pager [phone]   Rexanne Mano 06/15/2021, 2:31 PM

## 2021-06-15 NOTE — Progress Notes (Signed)
FPTS Interim Progress Note  S:Patient resting in bed. Staff reports no issues tonight  O: BP (!) 141/74 (BP Location: Left Arm)   Pulse 79   Temp 97.7 F (36.5 C) (Oral)   Resp 18   Ht 5\' 8"  (1.727 m)   Wt 101.8 kg   SpO2 96%   BMI 34.12 kg/m    Gen: NAD, laying in bed asleep Resp: no iWOB, chest rises symmetrically  A/P: Urinary Retention AMS  Worsening dementia/Alzheimer's  Temporary suicidality and agitation -labs and orders reviewed -stable, awaiting placement/APS guardianship pending  Gerrit Heck, MD 06/15/2021, 11:33 PM PGY-1, [place] Medicine Service pager [phone]

## 2021-06-15 NOTE — Progress Notes (Signed)
Occupational Therapy Treatment Patient Details Name: Charles Fox MRN: [id number] DOB: 09-14-1951 Today's Date: 06/15/2021   History of present illness Pt 70 yr old male who presented on 05/07/21 with AMS and hematuria/urinary retention (foley placed 8/15), diagnosis of UTI. Noted per MD notes dementia with intermittent agitation. PMH includes diabetes, CVA  hypertension, hyperlipidemia, homelessness, tobacco use disorder.   OT comments  Patient received in bed. Patient was able to sit up and donn footwear with setup and supervision while seated on eob. Patient was able to stand at sink to perform grooming, including shaving, for 20 minutes with supervision. Patient performed toilet transfer supervision for safety and was able to perform own hygiene. Patient continues to make good progress with OT treatment but remains unsafe due to memory and cognitive deficits.  Acute OT to continue to follow.    Recommendations for follow up therapy are one component of a multi-disciplinary discharge planning process, led by the attending physician.  Recommendations may be updated based on patient status, additional functional criteria and insurance authorization.    Follow Up Recommendations  SNF;Supervision/Assistance - 24 hour    Equipment Recommendations  None recommended by OT    Recommendations for Other Services      Precautions / Restrictions Precautions Precautions: Fall       Mobility Bed Mobility Overal bed mobility: Modified Independent             General bed mobility comments: patient required no assistance for bed mobility    Transfers Overall transfer level: Needs assistance Equipment used: None Transfers: Sit to/from Stand Sit to Stand: Supervision Stand pivot transfers: Supervision       General transfer comment: supervision for safety    Balance Overall balance assessment: Needs assistance Sitting-balance support: No upper extremity supported;Feet  supported Sitting balance-Leahy Scale: Good Sitting balance - Comments: independent with sitting balance   Standing balance support: No upper extremity supported;During functional activity Standing balance-Leahy Scale: Fair Standing balance comment: stands at sink for grooming without assistance                           ADL either performed or assessed with clinical judgement   ADL Overall ADL's : Needs assistance/impaired     Grooming: Wash/dry hands;Wash/dry face;Supervision/safety;Standing Grooming Details (indicate cue type and reason): performed shaving and grooming standing at sink             Lower Body Dressing: Supervision/safety Lower Body Dressing Details (indicate cue type and reason): donn footwear seated Toilet Transfer: Supervision/safety;Regular Glass blower/designer Details (indicate cue type and reason): supervision to transfer to toilet for safety Toileting- Clothing Manipulation and Hygiene: Modified independent Toileting - Clothing Manipulation Details (indicate cue type and reason): Able to perform hygiene standing     Functional mobility during ADLs: Supervision/safety General ADL Comments: used no assistive device in room     Vision       Perception     Praxis      Cognition Arousal/Alertness: Awake/alert Behavior During Therapy: WFL for tasks assessed/performed Overall Cognitive Status: No family/caregiver present to determine baseline cognitive functioning Area of Impairment: Safety/judgement;Memory;Problem solving                 Orientation Level: Disoriented to;Situation Current Attention Level: Selective Memory: Decreased short-term memory   Safety/Judgement: Decreased awareness of safety;Decreased awareness of deficits Awareness: Intellectual Problem Solving: Requires verbal cues;Slow processing General Comments: discussed his needing his truck  Exercises     Shoulder Instructions       General  Comments      Pertinent Vitals/ Pain       Pain Assessment: No/denies pain  Home Living                                          Prior Functioning/Environment              Frequency  Min 2X/week        Progress Toward Goals  OT Goals(current goals can now be found in the care plan section)  Progress towards OT goals: Progressing toward goals  Acute Rehab OT Goals Patient Stated Goal: get out of here OT Goal Formulation: With patient Time For Goal Achievement: 06/21/21 Potential to Achieve Goals: Fair ADL Goals Pt Will Perform Grooming: with modified independence;standing Pt Will Perform Upper Body Bathing: with modified independence;sitting Pt Will Perform Lower Body Bathing: with modified independence;sit to/from stand Pt Will Perform Upper Body Dressing: with modified independence;sitting Pt Will Perform Lower Body Dressing: with modified independence;sit to/from stand Pt Will Transfer to Toilet: with modified independence;ambulating Pt Will Perform Toileting - Clothing Manipulation and hygiene: with modified independence;sit to/from stand  Plan Discharge plan remains appropriate    Co-evaluation                 AM-PAC OT "6 Clicks" Daily Activity     Outcome Measure   Help from another person eating meals?: None Help from another person taking care of personal grooming?: A Little Help from another person toileting, which includes using toliet, bedpan, or urinal?: A Little Help from another person bathing (including washing, rinsing, drying)?: A Little Help from another person to put on and taking off regular upper body clothing?: A Little Help from another person to put on and taking off regular lower body clothing?: A Little 6 Click Score: 19    End of Session    OT Visit Diagnosis: Unsteadiness on feet (R26.81)   Activity Tolerance Patient tolerated treatment well   Patient Left in bed;with call bell/phone within reach    Nurse Communication Mobility status        Time: 4784-1282 OT Time Calculation (min): 29 min  Charges: OT General Charges $OT Visit: 1 Visit OT Treatments $Self Care/Home Management : 23-37 mins  Lodema Hong, OTA   Trixie Dredge 06/15/2021, 3:31 PM

## 2021-06-16 DIAGNOSIS — N179 Acute kidney failure, unspecified: Secondary | ICD-10-CM | POA: Diagnosis not present

## 2021-06-16 DIAGNOSIS — G308 Other Alzheimer's disease: Secondary | ICD-10-CM

## 2021-06-16 DIAGNOSIS — R4182 Altered mental status, unspecified: Secondary | ICD-10-CM | POA: Diagnosis not present

## 2021-06-16 DIAGNOSIS — F0281 Dementia in other diseases classified elsewhere with behavioral disturbance: Secondary | ICD-10-CM

## 2021-06-16 NOTE — Progress Notes (Signed)
Patient refused vitals throughout the night until morning , reason patient mentioned was he wanted to rest.

## 2021-06-16 NOTE — Progress Notes (Signed)
Family Medicine Teaching Service Daily Progress Note Intern Pager: [phone]  Patient name: Charles Fox Medical record number: [id number] Date of birth: 12-06-1950 Age: 70 y.o. Gender: male  Consultants: none Code Status: FULL   [name]:  8/19: Admitted for altered mental status and hematuria 8/20: Normal brain MRI and improved mentation  8/21: [facility] 12/30, determined to not have capacity 8/22: Discussion with son revealed patient does not have a safe place for discharge 8/23: PSA elevated.  Urology recommending keeping catheter and rechecking PSA in 1 month. 8/24: Voiding trial 8/25: Suicide precautions, voiding trial failed, Foley reinserted 8/26: Suicide precautions removed 8/29 agitated and combative, delirium precautions in place 9/5: AKI 9/9: AKI resolved   Assessment and Plan: Charles Fox is a 70 y.o. male admitted for altered mental status and hematuria with concern for bladder malignancy.  Past medical history significant for hypertension, T2DM, HLD, tobacco use disorder, and homelessness.  Patient is medically stable and awaiting SNF placement.   Urinary retention Patient has a possible bladder malignancy seen on CT A/P. He appears to be voiding regularly and denies dysuria.  - low threshold to bladder scan - continue tamsulosin and finasteride - urology outpatient follow-up - weekly CBC, next due October 3rd   Dementia At baseline.  - quetiapine 100 mg qhs - EKG today to check Qtc - ramelteon 8 mg qhs - recommend neurology outpatient follow-up for dementia   T2DM Stable. - metformin 500 mg daily   HTN Well-controlled. One soft BP this AM. - continue to monitor - encourage PO fluids with nursing - continue losartan 50 mg daily - continue carvedilol 3.125 mg BID   Tobacco use - nicotine patch 7 mg - should be able to discontinue this weekend   FEN/GI: Heart healthy/carb modified diet PPx: Enoxaparin Dispo: awaiting  SNF placement, APS guardianship pending  Subjective:  On interview this morning, the patient reports mild back pain but has no other complaints. He just recently walked with PT. He is alert and oriented to place. He denies chest pain, SoB, N/V.   Objective: Temp:  [97.3 F (36.3 C)-98.4 F (36.9 C)] 98.3 F (36.8 C) (09/28 0731) Pulse Rate:  [79-91] 82 (09/28 0731) Resp:  [17-18] 17 (09/28 0731) BP: (99-159)/(55-88) 133/83 (09/28 0731) SpO2:  [91 %-99 %] 92 % (09/28 0731) Physical Exam Vitals reviewed.  Cardiovascular:     Rate and Rhythm: Normal rate and regular rhythm.     Pulses: Normal pulses.     Heart sounds: No murmur heard. Pulmonary:     Effort: Pulmonary effort is normal.     Breath sounds: Normal breath sounds.  Abdominal:     General: Bowel sounds are normal.     Palpations: Abdomen is soft.     Tenderness: There is no abdominal tenderness.  Neurological:     Mental Status: He is alert.     Laboratory: Recent Labs  Lab 06/14/21 0551  WBC 7.5  HGB 13.9  HCT 43.2  PLT 277   No results for input(s): NA, K, CL, CO2, BUN, CREATININE, CALCIUM, PROT, BILITOT, ALKPHOS, ALT, AST, GLUCOSE in the last 168 hours.  Invalid input(s): LABALBU   Imaging/Diagnostic Tests: None new   Corky Sox PGY-1, Psychiatry FPTS Intern pager: [phone], text pages welcome

## 2021-06-16 NOTE — Progress Notes (Signed)
Physical Therapy Treatment Patient Details Name: Charles Fox MRN: [id number] DOB: Jan 22, 1951 Today's Date: 06/16/2021   History of Present Illness Pt 70 yr old male who presented on 05/07/21 with AMS and hematuria/urinary retention (foley placed 8/15), diagnosis of UTI. Noted per MD notes dementia with intermittent agitation. PMH includes diabetes, CVA  hypertension, hyperlipidemia, homelessness, tobacco use disorder.    PT Comments    Patient agreeable to therapy and working on increased safety with rollator. With repetition of sit to stand and proper sequence with brakes, pt able to demonstrate proper technique. Continues to need max cues with transfer to sit on rollator (needed when his back began to hurt while walking longer distance). Will continue to follow to attempt to improve his safety with rollator.     Recommendations for follow up therapy are one component of a multi-disciplinary discharge planning process, led by the attending physician.  Recommendations may be updated based on patient status, additional functional criteria and insurance authorization.  Follow Up Recommendations  Supervision/Assistance - 24 hour (long-term care needed due to decr memory)     Equipment Recommendations  Other (comment) (rollator)    Recommendations for Other Services       Precautions / Restrictions Precautions Precautions: Fall     Mobility  Bed Mobility Overal bed mobility: Modified Independent Bed Mobility: Supine to Sit;Sit to Supine     Supine to sit: Modified independent (Device/Increase time) Sit to supine: Modified independent (Device/Increase time)   General bed mobility comments: patient required no assistance for bed mobility    Transfers Overall transfer level: Needs assistance Equipment used: 4-wheeled walker Transfers: Sit to/from Stand Sit to Stand: Supervision Stand pivot transfers: Supervision       General transfer comment: supervision for cues for  use of rollator; with consecutive reps, able to demonstrate proper technique; will continue to assess for long-term carryover  Ambulation/Gait Ambulation/Gait assistance: Min guard;Supervision Gait Distance (Feet): 150 Feet (seated rest due to back pain; 100; seated rest; 50) Assistive device: 4-wheeled walker;None Gait Pattern/deviations: Step-through pattern;Wide base of support Gait velocity: WFL   General Gait Details: much less right and left drift; pt tends to stop when wanting to turn his head to address staff as he passes them; more reports of back pain and utilized rollator seat for seated rest breaks; required max cues and assist for locking brakes prior to turning to sit down on rollator   Stairs             Wheelchair Mobility    Modified Rankin (Stroke Patients Only)       Balance Overall balance assessment: Needs assistance Sitting-balance support: No upper extremity supported;Feet supported Sitting balance-Leahy Scale: Good Sitting balance - Comments: independent with sitting balance   Standing balance support: No upper extremity supported;During functional activity Standing balance-Leahy Scale: Fair Standing balance comment: stands at sink for grooming without assistance                            Cognition Arousal/Alertness: Awake/alert Behavior During Therapy: WFL for tasks assessed/performed Overall Cognitive Status: No family/caregiver present to determine baseline cognitive functioning Area of Impairment: Safety/judgement;Memory;Problem solving                     Memory: Decreased short-term memory   Safety/Judgement: Decreased awareness of safety;Decreased awareness of deficits   Problem Solving: Requires verbal cues;Slow processing;Difficulty sequencing General Comments: continued need for vc for sequencing/safe use  of rollator      Exercises      General Comments        Pertinent Vitals/Pain Pain Assessment:  Faces Faces Pain Scale: Hurts little more Pain Location: back Pain Descriptors / Indicators: Aching Pain Intervention(s): Limited activity within patient's tolerance;Repositioned;Monitored during session    Home Living                      Prior Function            PT Goals (current goals can now be found in the care plan section) Acute Rehab PT Goals Patient Stated Goal: get out of here Time For Goal Achievement: 06/15/21 Potential to Achieve Goals: Fair Progress towards PT goals: Progressing toward goals (slow progress with transfers and proper use of rollator)    Frequency    Min 2X/week      PT Plan Discharge plan needs to be updated    Co-evaluation              AM-PAC PT "6 Clicks" Mobility   Outcome Measure  Help needed turning from your back to your side while in a flat bed without using bedrails?: None Help needed moving from lying on your back to sitting on the side of a flat bed without using bedrails?: None Help needed moving to and from a bed to a chair (including a wheelchair)?: A Little Help needed standing up from a chair using your arms (e.g., wheelchair or bedside chair)?: A Little Help needed to walk in hospital room?: A Little Help needed climbing 3-5 steps with a railing? : A Little 6 Click Score: 20    End of Session   Activity Tolerance: Patient limited by pain Patient left: in bed;with call bell/phone within reach;with bed alarm set   PT Visit Diagnosis: Unsteadiness on feet (R26.81);Other abnormalities of gait and mobility (R26.89);Difficulty in walking, not elsewhere classified (R26.2) Pain - Right/Left:  (midline) Pain - part of body:  (low back)     Time: 1941-7408 PT Time Calculation (min) (ACUTE ONLY): 22 min  Charges:  $Gait Training: 8-22 mins                      Arby Barrette, PT Pager [phone]    Rexanne Mano 06/16/2021, 9:04 AM

## 2021-06-17 DIAGNOSIS — F0281 Dementia in other diseases classified elsewhere with behavioral disturbance: Secondary | ICD-10-CM | POA: Diagnosis not present

## 2021-06-17 DIAGNOSIS — N320 Bladder-neck obstruction: Secondary | ICD-10-CM | POA: Diagnosis not present

## 2021-06-17 DIAGNOSIS — R4182 Altered mental status, unspecified: Secondary | ICD-10-CM | POA: Diagnosis not present

## 2021-06-17 DIAGNOSIS — G308 Other Alzheimer's disease: Secondary | ICD-10-CM | POA: Diagnosis not present

## 2021-06-17 DIAGNOSIS — N179 Acute kidney failure, unspecified: Secondary | ICD-10-CM | POA: Diagnosis not present

## 2021-06-17 NOTE — Plan of Care (Signed)
  Problem: Clinical Measurements: Goal: Ability to maintain clinical measurements within normal limits will improve Outcome: Progressing Goal: Will remain free from infection Outcome: Progressing Goal: Respiratory complications will improve Outcome: Progressing   

## 2021-06-17 NOTE — Consult Note (Signed)
   [facility] Inpatient Consult   06/17/2021  TERYL MCCONAGHY 02-Sep-1951 [id number]  Follow up: [facility] 40 days, high risk review  Reviewed LCSW inpatient [facility] notes for barriers for transition.  Patient was active in the Embedded CCM program prior to admission with [facility].  Will alert Embedded CCM team of transitional needs if and when appropriate.  For questions, please contact:  [name], RN BSN [facility] Liaison  [phone] business mobile phone Toll free office [phone]  Fax number: [phone] [name]

## 2021-06-17 NOTE — Progress Notes (Signed)
Family Medicine Teaching Service Daily Progress Note Intern Pager: [phone]  Patient name: Charles Fox Medical record number: [id number] Date of birth: [birth year] Age: 70 y.o. Gender: male  Primary Care Provider: [name], MD Consultants: none Code Status: FULL  [name]:  8/19: Admitted for altered mental status and hematuria 8/20: Normal brain MRI and improved mentation  8/21: [facility] 12/30, determined to not have capacity 8/22: Discussion with son revealed patient does not have a safe place for discharge 8/23: PSA elevated.  Urology recommending keeping catheter and rechecking PSA in 1 month. 8/24: Voiding trial 8/25: Suicide precautions, voiding trial failed, Foley reinserted 8/26: Suicide precautions removed 8/29 agitated and combative, delirium precautions in place 9/5: AKI 9/9: AKI resolved   Assessment and Plan: Charles Fox is a 70 y.o. male admitted for altered mental status and hematuria with concern for bladder malignancy.  Past medical history significant for hypertension, T2DM, HLD, tobacco use disorder, and homelessness.  Patient is medically stable and awaiting SNF placement.   Urinary retention Patient with possible bladder malignancy based on CT A/P on admission. Has clotted off two foleys. Passed voiding trial one week ago.  - low threshold to bladder scan - continue tamsulosin and finasteride - urology outpatient follow-up - weekly CBC, next due October 3rd   Dementia At baseline. Alert and oriented to place. EKG with normal Qtc yesterday.  - quetiapine 100 mg qhs - ramelteon 8 mg qhs - recommend neurology outpatient follow-up for dementia   T2DM Stable. - metformin 500 mg daily   HTN Well-controlled. One soft BP this AM. - continue to monitor - encourage PO fluids with nursing - continue losartan 50 mg daily - continue carvedilol 3.125 mg BID   Tobacco use - nicotine patch 7 mg - should be able to discontinue  this weekend   FEN/GI: Heart healthy/carb modified diet PPx: Enoxaparin Dispo: awaiting SNF placement, APS guardianship pending   Subjective:  On interview this morning, the patient is alert and oriented to place. He denies pain, ROS negative for chest pain, SoB, N/V.   Objective: Temp:  [97.7 F (36.5 C)-98.9 F (37.2 C)] 98.9 F (37.2 C) (09/29 0806) Pulse Rate:  [75-99] 99 (09/29 0806) Resp:  [16] 16 (09/29 0806) BP: (123-148)/(72-85) 123/72 (09/29 0806) SpO2:  [96 %-97 %] 97 % (09/29 0806) Physical Exam Vitals reviewed.  Cardiovascular:     Rate and Rhythm: Normal rate and regular rhythm.     Pulses: Normal pulses.     Heart sounds: No murmur heard. Pulmonary:     Effort: Pulmonary effort is normal.     Breath sounds: Normal breath sounds.  Abdominal:     General: Bowel sounds are normal.     Palpations: Abdomen is soft.     Tenderness: There is no abdominal tenderness.  Neurological:     Mental Status: He is alert.    Laboratory: Recent Labs  Lab 06/14/21 0551  WBC 7.5  HGB 13.9  HCT 43.2  PLT 277   No results for input(s): NA, K, CL, CO2, BUN, CREATININE, CALCIUM, PROT, BILITOT, ALKPHOS, ALT, AST, GLUCOSE in the last 168 hours.  Invalid input(s): LABALBU   Imaging/Diagnostic Tests: EKG as above  Corky Sox PGY-1, Psychiatry FPTS Intern pager: [phone], text pages welcome

## 2021-06-17 NOTE — Progress Notes (Signed)
FPTS Brief Progress Note  S: Lying in bed resting.    O: BP (!) 148/85 (BP Location: Left Arm)   Pulse 75   Temp 97.7 F (36.5 C) (Oral)   Resp 16   Ht 5\' 8"  (1.727 m)   Wt 101.8 kg   SpO2 96%   BMI 34.12 kg/m   General: Appears well, no acute distress. Age appropriate.  A/P: Urinary retention - Orders reviewed. Weekly labs ordered, which was adjusted as needed.   Gerlene Fee, DO 06/17/2021, 1:56 AM PGY-3, [facility] Night Resident  Please page [phone] with questions.

## 2021-06-18 DIAGNOSIS — F0281 Dementia in other diseases classified elsewhere with behavioral disturbance: Secondary | ICD-10-CM | POA: Diagnosis not present

## 2021-06-18 DIAGNOSIS — G308 Other Alzheimer's disease: Secondary | ICD-10-CM | POA: Diagnosis not present

## 2021-06-18 MED ORDER — QUETIAPINE FUMARATE 50 MG PO TABS
150.0000 mg | ORAL_TABLET | ORAL | Status: DC
  Administered 2021-06-18 – 2021-06-29 (×12): 150 mg via ORAL
  Filled 2021-06-18 (×13): qty 1

## 2021-06-18 NOTE — Progress Notes (Signed)
FPTS Brief Progress Note  S: Sleeping.    O: BP 121/74 (BP Location: Left Arm)   Pulse 85   Temp 98.6 F (37 C) (Oral)   Resp 17   Ht 5\' 8"  (1.727 m)   Wt 101.8 kg   SpO2 97%   BMI 34.12 kg/m   General: Sleeping on left side in bed, no acute distress. Age appropriate. Respiratory: normal effort   A/P: Urinary retention Orders reviewed. Weekly labs ordered, which was adjusted as needed.   Gerlene Fee, DO 06/18/2021, 12:15 AM PGY-3, [place] Night Resident  Please page [phone] with questions.

## 2021-06-18 NOTE — Progress Notes (Signed)
Family Medicine Teaching Service Daily Progress Note Intern Pager: [phone]  Patient name: Charles Fox Medical record number: [id number] Date of birth: 04-15-1951 Age: 70 y.o. Gender: male  Primary Care Provider: [name], MD Consultants: none Code Status: FULL  [name]:  8/19: Admitted for altered mental status and hematuria 8/20: Normal brain MRI and improved mentation  8/21: [facility] 12/30, determined to not have capacity 8/22: Discussion with son revealed patient does not have a safe place for discharge 8/23: PSA elevated.  Urology recommending keeping catheter and rechecking PSA in 1 month. 8/24: Voiding trial 8/25: Suicide precautions, voiding trial failed, Foley reinserted 8/26: Suicide precautions removed 8/29 agitated and combative, delirium precautions in place 9/5: AKI 9/9: AKI resolved   Assessment and Plan: Charles Fox is a 70 y.o. male admitted for altered mental status and hematuria with concern for bladder malignancy.  Past medical history significant for hypertension, T2DM, HLD, tobacco use disorder, and homelessness.  Patient is medically stable and awaiting SNF placement.   Dementia At baseline. Alert and oriented to place. Patient reporting SI - 1:1 sitter for 24 hrs, re-assess tomorrow - Suicide precautions - increase quetiapine to 150 mg qhs - ramelteon 8 mg qhs - recommend neurology outpatient follow-up for dementia  Urinary retention Patient with potential bladder malignancy seen on CT A/P. Required multiple foleys, but now is voiding well and not retaining urine. Possibly due to use of Flomax and Finasteride. - low threshold to bladder scan - continue tamsulosin and finasteride - urology outpatient follow-up - weekly CBC, next due October 3rd    T2DM Stable. - metformin 500 mg daily   HTN Acceptable currently - continue to monitor - continue losartan 50 mg daily - continue carvedilol 3.125 mg BID    Tobacco use - nicotine patch 7 mg - should be able to discontinue this weekend   FEN/GI: Heart healthy/carb modified diet PPx: Enoxaparin Dispo: awaiting SNF placement, APS guardianship pending  Subjective:  On interview this morning, patient is asked an open ended question and he replies with statements about depression and not wanting to be alive. Nursing reports he has been crying in his room. He reports no specific plan but says "no one would know". He reports that he is unable to reach out for help if these thoughts become overwhelming.   Objective: Temp:  [97.5 F (36.4 C)-98.6 F (37 C)] 97.5 F (36.4 C) (09/30 0742) Pulse Rate:  [77-87] 77 (09/30 0742) Resp:  [15-18] 15 (09/30 0742) BP: (115-136)/(40-74) 115/40 (09/30 0742) SpO2:  [90 %-99 %] 90 % (09/30 0742) Physical Exam Cardiovascular:     Rate and Rhythm: Normal rate and regular rhythm.  Pulmonary:     Effort: Pulmonary effort is normal.     Breath sounds: Normal breath sounds.  Abdominal:     General: Bowel sounds are normal.     Palpations: Abdomen is soft.     Tenderness: There is no abdominal tenderness.  Neurological:     Mental Status: He is alert.     Laboratory: Recent Labs  Lab 06/14/21 0551  WBC 7.5  HGB 13.9  HCT 43.2  PLT 277   No results for input(s): NA, K, CL, CO2, BUN, CREATININE, CALCIUM, PROT, BILITOT, ALKPHOS, ALT, AST, GLUCOSE in the last 168 hours.  Invalid input(s): LABALBU   Imaging/Diagnostic Tests: None new   Corky Sox PGY-1, Psychiatry FPTS Intern pager: [phone], text pages welcome

## 2021-06-18 NOTE — Progress Notes (Signed)
Occupational Therapy Treatment Patient Details Name: Charles Fox MRN: [id number] DOB: 1951/03/11 Today's Date: 06/18/2021   History of present illness Pt 70 yr old male who presented on 05/07/21 with AMS and hematuria/urinary retention (foley placed 8/15), diagnosis of UTI. Noted per MD notes dementia with intermittent agitation. PMH includes diabetes, CVA  hypertension, hyperlipidemia, homelessness, tobacco use disorder.   OT comments  Patients bed alarm was going off and patient was standing at bedside asking to go to bathroom.  Patient was supervision to transfer to toilet and was able to perform toilet hygiene. Patient performed bathing standing at sink with vcs to attend to tasks and sequencing. Patient asked to return to bed but was encouraged to sit up in recliner. Acute OT to continue to follow.    Recommendations for follow up therapy are one component of a multi-disciplinary discharge planning process, led by the attending physician.  Recommendations may be updated based on patient status, additional functional criteria and insurance authorization.    Follow Up Recommendations  SNF;Supervision/Assistance - 24 hour    Equipment Recommendations  None recommended by OT    Recommendations for Other Services      Precautions / Restrictions Precautions Precautions: Fall       Mobility Bed Mobility Overal bed mobility: Modified Independent             General bed mobility comments: patient was out of bed when therapist entered room    Transfers Overall transfer level: Needs assistance Equipment used: None Transfers: Sit to/from Stand Sit to Stand: Supervision         General transfer comment: supervision for transfers for safety    Balance Overall balance assessment: Needs assistance Sitting-balance support: No upper extremity supported;Feet supported Sitting balance-Leahy Scale: Good Sitting balance - Comments: independent with sitting balance   Standing  balance support: No upper extremity supported;During functional activity Standing balance-Leahy Scale: Fair Standing balance comment: continues to be unsafe standing and refuses rollator in room                           ADL either performed or assessed with clinical judgement   ADL Overall ADL's : Needs assistance/impaired     Grooming: Wash/dry hands;Wash/dry face;Supervision/safety;Standing Grooming Details (indicate cue type and reason): supervision standing at sink due to unsafe habits Upper Body Bathing: Supervision/ safety;Standing Upper Body Bathing Details (indicate cue type and reason): performed standing at sink Lower Body Bathing: Supervison/ safety;Sit to/from stand;Sitting/lateral leans Lower Body Bathing Details (indicate cue type and reason): bathed feet while sitting Upper Body Dressing : Supervision/safety Upper Body Dressing Details (indicate cue type and reason): To down gown Lower Body Dressing: Supervision/safety Lower Body Dressing Details (indicate cue type and reason): donn footwear seated Toilet Transfer: Supervision/safety;Regular Glass blower/designer Details (indicate cue type and reason): Patient was taking self to bathroom with therapist entered room Toileting- Clothing Manipulation and Hygiene: Modified independent Toileting - Clothing Manipulation Details (indicate cue type and reason): Able to perform hygiene standing     Functional mobility during ADLs: Supervision/safety General ADL Comments: used no assistive device in room     Vision       Perception     Praxis      Cognition Arousal/Alertness: Awake/alert Behavior During Therapy: WFL for tasks assessed/performed Overall Cognitive Status: No family/caregiver present to determine baseline cognitive functioning Area of Impairment: Safety/judgement;Memory;Problem solving  Orientation Level: Disoriented to;Time;Situation Current Attention Level:  Selective Memory: Decreased short-term memory   Safety/Judgement: Decreased awareness of safety;Decreased awareness of deficits Awareness: Intellectual Problem Solving: Requires verbal cues;Slow processing;Difficulty sequencing General Comments: patient unsafe, ambulating in room without assistance or device        Exercises     Shoulder Instructions       General Comments      Pertinent Vitals/ Pain       Pain Assessment: No/denies pain  Home Living                                          Prior Functioning/Environment              Frequency  Min 2X/week        Progress Toward Goals  OT Goals(current goals can now be found in the care plan section)  Progress towards OT goals: Progressing toward goals  Acute Rehab OT Goals Patient Stated Goal: I want to go home OT Goal Formulation: With patient Time For Goal Achievement: 06/21/21 Potential to Achieve Goals: Fair ADL Goals Pt Will Perform Grooming: with modified independence;standing Pt Will Perform Upper Body Bathing: with modified independence;sitting Pt Will Perform Lower Body Bathing: with modified independence;sit to/from stand Pt Will Perform Upper Body Dressing: with modified independence;sitting Pt Will Perform Lower Body Dressing: with modified independence;sit to/from stand Pt Will Transfer to Toilet: with modified independence;ambulating Pt Will Perform Toileting - Clothing Manipulation and hygiene: with modified independence;sit to/from stand  Plan Discharge plan remains appropriate    Co-evaluation                 AM-PAC OT "6 Clicks" Daily Activity     Outcome Measure   Help from another person eating meals?: None Help from another person taking care of personal grooming?: A Little Help from another person toileting, which includes using toliet, bedpan, or urinal?: A Little Help from another person bathing (including washing, rinsing, drying)?: A Little Help from  another person to put on and taking off regular upper body clothing?: A Little Help from another person to put on and taking off regular lower body clothing?: A Little 6 Click Score: 19    End of Session    OT Visit Diagnosis: Unsteadiness on feet (R26.81)   Activity Tolerance Patient tolerated treatment well   Patient Left in chair;with call bell/phone within reach;with chair alarm set   Nurse Communication Mobility status        Time: 8466-5993 OT Time Calculation (min): 12 min  Charges: OT General Charges $OT Visit: 1 Visit OT Treatments $Self Care/Home Management : 8-22 mins  Lodema Hong, OTA   Trixie Dredge 06/18/2021, 8:47 AM

## 2021-06-18 NOTE — Progress Notes (Signed)
Family Medicine Teaching Service Daily Progress Note Intern Pager: [phone]  Patient name: Charles Fox Medical record number: [id number] Date of birth: [birth year] Age: 70 y.o. Gender: male  Primary Care Provider: [name], MD Consultants: None Code Status: FULL  [name]:  8/19 Admitted for AMS and hematuria 8/20 normal brain MRI and improved mentation 8/21 [facility] 12/30 determined not to have capacity 8/22 discussion with son revealed patient does not have a safe place for discharge 8/23 PSA elevated, urology recommending keeping catheter and rechecking PSA in 1 month 8/24 voiding trial 8/25 suicide precautions, voiding trial failed, foley reinserted 8/26 suicide precautions removed 8/29 agitated and combative, delirium precautions in place 9/5 AKI 9/9 AKI resolved  Assessment and Plan: Charles Fox is a 70 yo male admitted for AMS and hematuria with concern for bladder malignancy. PMH significant for HTN, T2DM, HLD, tobacco use disorder, and homelessness. Medically stable awaiting SNG placement.   Dementia Patient reports improvement in mood and denies SI/thoughts of self harm.  -D/C 1:1 sitter -D/C suicide precautions  -quetiapine increased yesterday to 150 mg qhs -ramelteon 8 mg qhs -neurology outpatient f/u for dementia  Urinary retention Potential bladder malignancy seen on CT A/P. Required multiple foleys and voiding well currently.  -Continue flomax and finasteride -low threshold to bladder scan -urology outpt follow up -weekly CBC on Mondays (10/3 next)  T2DM Stable -metformin 500 mg daily  HTN BP has been acceptable.   -continue to monitor -losartan 50 mg daily -carvedilol 3.125 mg BID  Tobacco Use -Nicotine patch refused  FEN/GI: HH/carb modified  PPx: enoxaparin Dispo:SNF pending placement, APS guardianship pending.  Subjective:  On interview this morning, patient reports improved mood and denies SI/thoughts of  self harm. He feels that he can reach out to staff if he has these thoughts again. He denies pain, chest pain, SoB, N/V.      Objective: Temp:  [97.5 F (36.4 C)-98.5 F (36.9 C)] 98.5 F (36.9 C) (09/30 1949) Pulse Rate:  [75-89] 89 (09/30 1949) Resp:  [15-18] 17 (09/30 1949) BP: (111-136)/(40-72) 134/72 (09/30 1949) SpO2:  [90 %-99 %] 99 % (09/30 1949) Physical Exam Vitals reviewed.  Cardiovascular:     Rate and Rhythm: Normal rate and regular rhythm.  Pulmonary:     Effort: Pulmonary effort is normal.     Breath sounds: Normal breath sounds.  Abdominal:     General: Bowel sounds are normal.     Palpations: Abdomen is soft.     Tenderness: There is no abdominal tenderness.  Neurological:     Mental Status: He is alert.    Laboratory: Recent Labs  Lab 06/14/21 0551  WBC 7.5  HGB 13.9  HCT 43.2  PLT 277   No results for input(s): NA, K, CL, CO2, BUN, CREATININE, CALCIUM, PROT, BILITOT, ALKPHOS, ALT, AST, GLUCOSE in the last 168 hours.  Invalid input(s): LABALBU    Imaging/Diagnostic Tests: None new   Corky Sox PGY-1, Psychiatry FPTS Intern pager: [phone], text pages welcome

## 2021-06-18 NOTE — Progress Notes (Signed)
Physical Therapy Treatment Patient Details Name: Charles Fox MRN: [id number] DOB: [birth year] Today's Date: 06/18/2021   History of Present Illness Pt 70 yr old male who presented on 05/07/21 with AMS and hematuria/urinary retention (foley placed 8/15), diagnosis of UTI. Noted per MD notes dementia with intermittent agitation. PMH includes diabetes, CVA  hypertension, hyperlipidemia, homelessness, tobacco use disorder.    PT Comments    Pt received in recliner, agreeable to participation in therapy. He required supervision transfers, and supervision/min guard assist ambulation 175' with rollator. Pt mildly agitated today and perseverating on "that man kicked me out of my room." Assured pt he has not been kicked out and he is able to stay in his room. Pt difficult to redirect and insisting he has been kicked out and plans to sleep in his truck tonight. Pt returned to recliner at end of session.    Recommendations for follow up therapy are one component of a multi-disciplinary discharge planning process, led by the attending physician.  Recommendations may be updated based on patient status, additional functional criteria and insurance authorization.  Follow Up Recommendations  Supervision/Assistance - 24 hour (LTC needed due to cognition)     Equipment Recommendations  Other (comment) (rollator)    Recommendations for Other Services       Precautions / Restrictions Precautions Precautions: Fall     Mobility  Bed Mobility Overal bed mobility: Modified Independent             General bed mobility comments: patient was out of bed when therapist entered room    Transfers Overall transfer level: Needs assistance Equipment used: Ambulation equipment used Transfers: Sit to/from Stand;Stand Pivot Transfers Sit to Stand: Supervision Stand pivot transfers: Supervision       General transfer comment: supervision for transfers for safety  Ambulation/Gait Ambulation/Gait  assistance: Min guard;Supervision Gait Distance (Feet): 175 Feet Assistive device: 4-wheeled walker Gait Pattern/deviations: Step-through pattern Gait velocity: WFL Gait velocity interpretation: 1.31 - 2.62 ft/sec, indicative of limited community ambulator General Gait Details: supervision for open, straight areas and min guard for turns, maneuvering around obstacles   Stairs             Wheelchair Mobility    Modified Rankin (Stroke Patients Only)       Balance Overall balance assessment: Needs assistance Sitting-balance support: No upper extremity supported;Feet supported Sitting balance-Leahy Scale: Good Sitting balance - Comments: independent with sitting balance   Standing balance support: No upper extremity supported;During functional activity;Bilateral upper extremity supported Standing balance-Leahy Scale: Fair Standing balance comment: rollator for ambulation                            Cognition Arousal/Alertness: Awake/alert Behavior During Therapy: WFL for tasks assessed/performed;Agitated;Impulsive (mildly agitated) Overall Cognitive Status: No family/caregiver present to determine baseline cognitive functioning Area of Impairment: Safety/judgement;Memory;Problem solving;Orientation;Attention;Following commands;Awareness                 Orientation Level: Disoriented to;Time;Situation;Place Current Attention Level: Selective Memory: Decreased short-term memory Following Commands: Follows one step commands inconsistently Safety/Judgement: Decreased awareness of safety;Decreased awareness of deficits Awareness: Intellectual Problem Solving: Requires verbal cues;Slow processing;Difficulty sequencing General Comments: Pt perseverating on "that man kicked me out of my room. I'm going to sleep in my truck tonight." Difficult/unable to redirect.      Exercises      General Comments General comments (skin integrity, edema, etc.): VSS       Pertinent Vitals/Pain  Pain Assessment: No/denies pain    Home Living                      Prior Function            PT Goals (current goals can now be found in the care plan section) Acute Rehab PT Goals Patient Stated Goal: not stated PT Goal Formulation: Patient unable to participate in goal setting Time For Goal Achievement: 06/29/21 Potential to Achieve Goals: Fair Progress towards PT goals: Progressing toward goals    Frequency    Min 2X/week      PT Plan Current plan remains appropriate    Co-evaluation              AM-PAC PT "6 Clicks" Mobility   Outcome Measure  Help needed turning from your back to your side while in a flat bed without using bedrails?: None Help needed moving from lying on your back to sitting on the side of a flat bed without using bedrails?: None Help needed moving to and from a bed to a chair (including a wheelchair)?: A Little Help needed standing up from a chair using your arms (e.g., wheelchair or bedside chair)?: A Little Help needed to walk in hospital room?: A Little Help needed climbing 3-5 steps with a railing? : A Little 6 Click Score: 20    End of Session Equipment Utilized During Treatment: Gait belt Activity Tolerance: Patient tolerated treatment well Patient left: in chair;with call bell/phone within reach;with chair alarm set Nurse Communication: Mobility status PT Visit Diagnosis: Unsteadiness on feet (R26.81);Other abnormalities of gait and mobility (R26.89);Difficulty in walking, not elsewhere classified (R26.2)     Time: 2703-5009 PT Time Calculation (min) (ACUTE ONLY): 22 min  Charges:  $Gait Training: 8-22 mins                     Lorrin Goodell, PT  Office # [phone] Pager [phone]    Lorriane Shire 06/18/2021, 9:44 AM

## 2021-06-18 NOTE — Progress Notes (Signed)
FPTS Interim Progress Note  S:Resting in bed, sitter in room expressed no acute concerns  O: BP 134/72 (BP Location: Left Arm)   Pulse 89   Temp 98.5 F (36.9 C) (Oral)   Resp 17   Ht 5\' 8"  (1.727 m)   Wt 101.8 kg   SpO2 99%   BMI 34.12 kg/m    Gen: NAD, sitter in place Resp: no iWOB, chest rises symmetrically  A/P: Dementia/ Urinary Retention Expressed SI this AM, 1:1 sitter in place and quetiapine was increased. Monitoring UOP -Labs and orders reviewed and adjusted as necessary  Gerrit Heck, MD 06/18/2021, 9:45 PM PGY-1, [place] [facility] Service pager [phone]

## 2021-06-19 DIAGNOSIS — G309 Alzheimer's disease, unspecified: Secondary | ICD-10-CM

## 2021-06-19 DIAGNOSIS — F02C18 Dementia in other diseases classified elsewhere, severe, with other behavioral disturbance: Secondary | ICD-10-CM | POA: Diagnosis not present

## 2021-06-19 DIAGNOSIS — R4182 Altered mental status, unspecified: Secondary | ICD-10-CM | POA: Diagnosis not present

## 2021-06-19 DIAGNOSIS — Z720 Tobacco use: Secondary | ICD-10-CM

## 2021-06-19 DIAGNOSIS — I10 Essential (primary) hypertension: Secondary | ICD-10-CM | POA: Diagnosis not present

## 2021-06-19 NOTE — Progress Notes (Addendum)
Family Medicine Teaching Service Daily Progress Note Intern Pager: [phone]  Patient name: Charles Fox Medical record number: [id number] Date of birth: 1951-05-03 Age: 70 y.o. Gender: male  Primary Care Provider: [name], MD Consultants: None   Code Status: Full Code  [name]:  8/19: Admitted for altered mental status and hematuria 8/20: Normal brain MRI and improved mentation  8/21: [facility] 12/30, determined to not have capacity 8/22: Discussion with son revealed patient does not have a safe place for discharge 8/23: PSA elevated.  Urology recommending keeping catheter and rechecking PSA in 1 month. 8/24: Voiding trial 8/25: Suicide precautions, voiding trial failed, Foley reinserted 8/26: Suicide precautions removed 8/29 agitated and combative, delirium precautions in place 9/5: AKI 9/9: AKI resolved 9/18: Foley removed 9/30: reported SI, placed on suicide precautions with sitter 10/1: suicide precautions removed  Assessment and Plan: Charles Fox is a 70 y.o. male admitted for altered mental status and hematuria with concern for bladder malignancy.  Past medical history significant for hypertension, T2DM, HLD, tobacco use disorder, and homelessness.  Patient is medically stable and awaiting SNF placement.  Urinary retention Possible bladder malignancy seen on CT.  - continue tamsulosin and finasteride - urology outpatient follow-up  Dementia With intermittent agitation. - quetiapine 150 mg qhs - ramelteon 8 mg qhs - recommend neurology outpatient follow-up for dementia  T2DM Stable. - metformin 500 mg daily  HTN Well-controlled. - continue losartan 50 mg daily - continue carvedilol 3.125 mg BID  Tobacco use - nicotine patch 7 mg  FEN/GI: Heart healthy/carb modified diet PPx: Enoxaparin  Disposition: Awaiting SNF placement, APS guardianship case pending  Subjective:  NAOE.  Reports feeling good, no concerns.  He had no  questions.  Objective: Temp:  [97.8 F (36.6 C)-98.5 F (36.9 C)] 98.5 F (36.9 C) (10/02 0519) Pulse Rate:  [77-86] 79 (10/02 0519) Resp:  [16-18] 16 (10/02 0519) BP: (117-148)/(69-91) 117/69 (10/02 0519) SpO2:  [95 %-99 %] 95 % (10/02 0519) Physical Exam: General: Alert, pleasant, NAD Cardiovascular: RRR, no murmurs Respiratory: Clear to auscultation bilaterally, no respiratory distress Abdomen: Soft, nontender, positive bowel sounds Extremities: Warm and well perfused, no edema  Laboratory: Recent Labs  Lab 06/14/21 0551  WBC 7.5  HGB 13.9  HCT 43.2  PLT 277   No results for input(s): NA, K, CL, CO2, BUN, CREATININE, CALCIUM, PROT, BILITOT, ALKPHOS, ALT, AST, GLUCOSE in the last 168 hours.  Invalid input(s): LABALBU    Imaging/Diagnostic Tests: No new imaging.  [name], MD 06/20/2021, 6:43 AM PGY-2, [place] Intern pager: [phone], text pages welcome

## 2021-06-19 NOTE — Progress Notes (Signed)
MD in room talking to pt now.

## 2021-06-20 DIAGNOSIS — R339 Retention of urine, unspecified: Secondary | ICD-10-CM | POA: Diagnosis not present

## 2021-06-20 DIAGNOSIS — F02C18 Dementia in other diseases classified elsewhere, severe, with other behavioral disturbance: Secondary | ICD-10-CM | POA: Diagnosis not present

## 2021-06-20 DIAGNOSIS — G309 Alzheimer's disease, unspecified: Secondary | ICD-10-CM | POA: Diagnosis not present

## 2021-06-20 DIAGNOSIS — R4182 Altered mental status, unspecified: Secondary | ICD-10-CM | POA: Diagnosis not present

## 2021-06-20 DIAGNOSIS — I10 Essential (primary) hypertension: Secondary | ICD-10-CM | POA: Diagnosis not present

## 2021-06-20 DIAGNOSIS — Z720 Tobacco use: Secondary | ICD-10-CM | POA: Diagnosis not present

## 2021-06-20 NOTE — Progress Notes (Signed)
FPTS Brief Progress Note  S: Spoke with RN, no concerns.  He did wander into another patient's room earlier this evening but otherwise he has been calm in his room.   O: BP 127/77 (BP Location: Left Arm)   Pulse 86   Temp 97.8 F (36.6 C) (Oral)   Resp 17   Ht 5\' 8"  (1.727 m)   Wt 101.8 kg   SpO2 95%   BMI 34.12 kg/m     A/P: Still remains primarily a disposition issue. - Orders reviewed. Labs for AM not ordered, which was adjusted as needed.   [name], MD 06/20/2021, 12:20 AM PGY-2, [facility] Night Resident  Please page [phone] with questions.

## 2021-06-21 DIAGNOSIS — F02C18 Dementia in other diseases classified elsewhere, severe, with other behavioral disturbance: Secondary | ICD-10-CM

## 2021-06-21 DIAGNOSIS — G309 Alzheimer's disease, unspecified: Secondary | ICD-10-CM | POA: Diagnosis not present

## 2021-06-21 DIAGNOSIS — R4182 Altered mental status, unspecified: Secondary | ICD-10-CM | POA: Diagnosis not present

## 2021-06-21 LAB — CBC
HCT: 40.6 % (ref 39.0–52.0)
Hemoglobin: 13.2 g/dL (ref 13.0–17.0)
MCH: 28.6 pg (ref 26.0–34.0)
MCHC: 32.5 g/dL (ref 30.0–36.0)
MCV: 88.1 fL (ref 80.0–100.0)
Platelets: 295 10*3/uL (ref 150–400)
RBC: 4.61 MIL/uL (ref 4.22–5.81)
RDW: 14.1 % (ref 11.5–15.5)
WBC: 8.5 10*3/uL (ref 4.0–10.5)
nRBC: 0 % (ref 0.0–0.2)

## 2021-06-21 NOTE — Progress Notes (Signed)
Family Medicine Teaching Service Daily Progress Note Intern Pager: [phone]  Patient name: Charles Fox Medical record number: [id number] Date of birth: [birth year] Age: 70 y.o. Gender: male  Primary Care Provider: [name], MD Consultants: none Code Status: FULL  [name]:  8/19: Admitted for altered mental status and hematuria 8/20: Normal brain MRI and improved mentation  8/21: [facility] 12/30, determined to not have capacity 8/22: Discussion with son revealed patient does not have a safe place for discharge 8/23: PSA elevated.  Urology recommending keeping catheter and rechecking PSA in 1 month. 8/24: Voiding trial 8/25: Suicide precautions, voiding trial failed, Foley reinserted 8/26: Suicide precautions removed 8/29 agitated and combative, delirium precautions in place 9/5: AKI 9/9: AKI resolved 9/18: Foley removed 9/30: reported SI, placed on suicide precautions with sitter 10/1: suicide precautions removed   Assessment and Plan: Charles Fox is a 70 y.o. male admitted for altered mental status and hematuria with concern for bladder malignancy.  Past medical history significant for hypertension, T2DM, HLD, tobacco use disorder, and homelessness.  Patient is medically stable and awaiting SNF placement.  Self -reported fall with head trauma Called by nursing with reports that patient fell and hit his head on the sink. He reports tripping and falling, hitting the sink with his head. On assessment patient is at baseline mental status but complains of R forehead pain. He does not believe he lost consciousness. Reports he may have been dizzy.The R frontal area is very tender, no crepitus felt, no obvious physical deformity present. Neuro exam with 5/5 strength in UE and LE, intact sensation, CN grossly intact. Low concern for significant bleed, no CT necessary at this time. Was contacted by nursing due to patient reporting he fabricated the event. Given  patient's dementia and and significant point tenderness over the area, I feel it is likely he did fall and may not remember the event.  -Tele sitter to remind patient to stay sedentary -PT to work with patient, will contact -Orthostatics -Low threshold to perform CTH, given concern for subdural bleed -Neurochecks q4hrs   Urinary retention Possible bladder malignancy seen on CT. Patient reports voiding regularly. Weekly CBC with Hgb of 13.2.  - Next CBC 10/10 - Low threshold to bladder scan - continue tamsulosin and finasteride - urology outpatient follow-up   Dementia With intermittent agitation. Currently at baseline.  - quetiapine 150 mg qhs - ramelteon 8 mg qhs - recommend neurology outpatient follow-up for dementia   T2DM Stable. - metformin 500 mg daily   HTN Well-controlled. - continue losartan 50 mg daily - continue carvedilol 3.125 mg BID   Tobacco use - nicotine patch 7 mg   FEN/GI: Heart healthy/carb modified diet PPx: Enoxaparin   Disposition: Awaiting SNF placement, APS guardianship case pending   Subjective:  On interview this morning patient reports a version of events as above. He says "I am doing fine".   Objective: Temp:  [98.1 F (36.7 C)-98.9 F (37.2 C)] 98.2 F (36.8 C) (10/03 0510) Pulse Rate:  [76-87] 77 (10/03 0510) Resp:  [14-19] 14 (10/03 0510) BP: (120-150)/(67-90) 120/73 (10/03 0510) SpO2:  [92 %-98 %] 97 % (10/03 0510) Physical Exam Vitals reviewed.  Cardiovascular:     Rate and Rhythm: Normal rate and regular rhythm.  Pulmonary:     Effort: Pulmonary effort is normal.     Breath sounds: Normal breath sounds.  Abdominal:     General: Bowel sounds are normal.  Palpations: Abdomen is soft.     Tenderness: There is no abdominal tenderness.  Neurological:     Mental Status: He is alert.     Comments: At baseline mental status, strength and sensation intact in UE and LE, CN grossly intact, neck ROM normal.       Laboratory: Recent Labs  Lab 06/21/21 0023  WBC 8.5  HGB 13.2  HCT 40.6  PLT 295   No results for input(s): NA, K, CL, CO2, BUN, CREATININE, CALCIUM, PROT, BILITOT, ALKPHOS, ALT, AST, GLUCOSE in the last 168 hours.  Invalid input(s): LABALBU   Imaging/Diagnostic Tests: CBC as above  Corky Sox PGY-1, Psychiatry FPTS Intern pager: [phone], text pages welcome

## 2021-06-21 NOTE — Plan of Care (Signed)
Called by nursing with reports that patient fell and hit his head on the sink. On assessment patient is at baseline mental status but complains of R forehead pain. He reports tripping and falling, hitting the sink with his head. He does not believe he lost consciousness. Reports he may have been dizzy.The R frontal area is very tender, no crepitus felt, no obvious physical deformity present. Neuro exam with 5/5 strength in UE and LE, intact sensation, CN grossly intact. Plan for tele sitter and orthostatics.  Corky Sox PGY-1, Psychiatry [place] Intern pager: [phone], text pages welcome

## 2021-06-21 NOTE — Progress Notes (Signed)
Patient's bed alarm going off. When  this nurse arrived in room patient was up walking around. Per patient, he feel trying to get out of bed and hit his head on the sink. There is no redness, break in skin or bruising on head. Notified MD. Pupils reactive, patient has no distress at this time.

## 2021-06-21 NOTE — Progress Notes (Signed)
Patient has informed this nurse that he did not fall or hit his head. The patient stated he wanted attention. Notified MD.

## 2021-06-21 NOTE — Progress Notes (Signed)
FPTS Brief Progress Note  S:Patient sleeping in bed with television on at time of exam.    O: BP (!) 150/90 (BP Location: Right Leg)   Pulse 81   Temp 98.9 F (37.2 C) (Oral)   Resp 19   Ht 5\' 8"  (1.727 m)   Wt 101.8 kg   SpO2 92%   BMI 34.12 kg/m   Gen: male appearing stated age, no acute distress, sleeping  A/P: - no new interventions at this time - patient awaiting safe disposition planning   Eulis Foster, MD 06/21/2021, 4:52 AM PGY-3, [facility] Night Resident  Please page [phone] with questions.

## 2021-06-21 NOTE — Plan of Care (Signed)
  Problem: Education: Goal: Knowledge of General Education information will improve Description Including pain rating scale, medication(s)/side effects and non-pharmacologic comfort measures Outcome: Progressing   Problem: Health Behavior/Discharge Planning: Goal: Ability to manage health-related needs will improve Outcome: Progressing   

## 2021-06-22 ENCOUNTER — Inpatient Hospital Stay (HOSPITAL_COMMUNITY): Payer: Medicare HMO

## 2021-06-22 DIAGNOSIS — R4182 Altered mental status, unspecified: Secondary | ICD-10-CM | POA: Diagnosis not present

## 2021-06-22 DIAGNOSIS — F02C18 Dementia in other diseases classified elsewhere, severe, with other behavioral disturbance: Secondary | ICD-10-CM | POA: Diagnosis not present

## 2021-06-22 DIAGNOSIS — G309 Alzheimer's disease, unspecified: Secondary | ICD-10-CM | POA: Diagnosis not present

## 2021-06-22 DIAGNOSIS — S0990XA Unspecified injury of head, initial encounter: Secondary | ICD-10-CM | POA: Diagnosis not present

## 2021-06-22 NOTE — Progress Notes (Addendum)
Family Medicine Teaching Service Daily Progress Note Intern Pager: [phone]  Patient name: Charles Fox Medical record number: [id number] Date of birth: 10-20-1950 Age: 70 y.o. Gender: male  Primary Care Provider: [name], MD Consultants: None Code Status: Full  [name] is a 70 year old male who presented with AMS and hematuria concerning for possible bladder cancer. His PMH is significant for T2DM,  HTN, Homelessness and tobacco use disorder.  Assessment and Plan:  Recent self reported Fall Patient had a recent self reported fall on 10/03. Today he denies being in pain. He seem to have improved from his presentation yesterday. He is more interactive, and responsive. Although oriented to only person, he seems coherent and aware of his surroundings. On exam he is able to follow commands appropriately, pupils were equally reactive and muscle strength 5/5 on all extremities. He has no visible bruise. -Pt working with patient -encourage ambulation with nurse  -Orthostatic vitals -encourage P/O fluid intake  -Stopped neuro check  Urinary retention Concerning for possible bladder cancer. Will be followed out patient with urology. -continue tamsulosin  -continue finasteride  -follow up with urology outpatient - Schedule CBC for 10/10  Dementia Patient was oriented to person but not place and time.  He had a pleasant affect and was able to follow commands appropriately. -continue ramelto 8 mg QHS -Continue Quetiapine 150 mg QHS  Type 2 DM -Metformin 500 mg daily  HTN -Continue carvedilol 3.125 mg BID -Continue losartan 50 mg daily  Tobacco Use - continue Nicotine patch  Homelessness Pt is currently awaiting SNF placement   FEN/GI: Heart Health diet PPx: Enpxaparin Dispo:SNF  awaiting bed availability  Subjective:  Patient say he is doing fine and slept well.  He has no concerns and denies any headaches.  He expressed  interest in going home and wanted out of the hospital but not clear of his destination after discharge from hospital.  He has no complaint at this time.  Objective: Temp:  [97.6 F (36.4 C)-97.9 F (36.6 C)] 97.7 F (36.5 C) (10/04 1704) Pulse Rate:  [67-106] 67 (10/04 1704) Resp:  [17-19] 18 (10/04 1704) BP: (105-176)/(8-94) 125/76 (10/04 1704) SpO2:  [90 %-100 %] 100 % (10/04 0352) Physical Exam: General: Awake, sitting up in bed, NAD Cardiovascular: RRR, no murmurs, S1/S2 Respiratory: CTA B, no wheezing, no crackles, normal work of breathing on room air Abdomen: Soft, no distention, no tenderness Extremities: No edema on LE, Muscle strength 5/5 on all extremities.  2 radial and pedal pulses Neuro: Oriented x1(person), pupil equal and reactive to light, CN II through XII intact.  Laboratory: Recent Labs  Lab 06/21/21 0023  WBC 8.5  HGB 13.2  HCT 40.6  PLT 295   No results for input(s): NA, K, CL, CO2, BUN, CREATININE, CALCIUM, PROT, BILITOT, ALKPHOS, ALT, AST, GLUCOSE in the last 168 hours.   Imaging/Diagnostic Tests: No imaging  Charles Bleacher, MD 06/22/2021, 7:24 PM PGY-1, [place] Intern pager: [phone], text pages welcome

## 2021-06-22 NOTE — Progress Notes (Addendum)
Physical Therapy Treatment and Discharge Patient Details Name: Charles Fox MRN: [id number] DOB: Oct 28, 1950 Today's Date: 06/22/2021   History of Present Illness Pt 70 yr old male who presented on 05/07/21 with AMS and hematuria/urinary retention (foley placed 8/15), diagnosis of UTI. Noted per MD notes dementia with intermittent agitation. PMH includes diabetes, CVA  hypertension, hyperlipidemia, homelessness, tobacco use disorder.    PT Comments    Patient continues to require supervision for safe use of rollator for transfers and ambulation. He has not made progress towards goal of modified independent and do not believe he will be able to achieve this goal due to decreased memory/cognition. Patient has maximized his benefit from PT and is being discharged at this time.     Recommendations for follow up therapy are one component of a multi-disciplinary discharge planning process, led by the attending physician.  Recommendations may be updated based on patient status, additional functional criteria and insurance authorization.  Follow Up Recommendations  Supervision/Assistance - 24 hour (LTC needed due to cognition)     Equipment Recommendations  Other (comment) (rollator)    Recommendations for Other Services       Precautions / Restrictions Precautions Precautions: Fall     Mobility  Bed Mobility Overal bed mobility: Modified Independent Bed Mobility: Supine to Sit     Supine to sit: Modified independent (Device/Increase time)     General bed mobility comments: returned to sitting EOB waiting for his lunch    Transfers Overall transfer level: Needs assistance Equipment used: Ambulation equipment used Transfers: Sit to/from [facility] Sit to Stand: Supervision Stand pivot transfers: Supervision       General transfer comment: supervision for transfers for safe use of rollator (pt tends to unlock brakes before he goes to pull on rollator handles  to come to stand; can do correctly with cues). Also needs cues for turning to sit on rollator and when going to stand up from rollator  Ambulation/Gait Ambulation/Gait assistance: Supervision Gait Distance (Feet): 75 Feet (seated rest; 75 ft) Assistive device: 4-wheeled walker Gait Pattern/deviations: Step-through pattern Gait velocity: WFL   General Gait Details: turning head talking to nurses without imbalance or drift   Stairs             Wheelchair Mobility    Modified Rankin (Stroke Patients Only)       Balance Overall balance assessment: Needs assistance Sitting-balance support: No upper extremity supported;Feet supported Sitting balance-Leahy Scale: Good Sitting balance - Comments: independent with sitting balance   Standing balance support: No upper extremity supported;During functional activity;Bilateral upper extremity supported Standing balance-Leahy Scale: Fair Standing balance comment: rollator for ambulation                            Cognition Arousal/Alertness: Awake/alert Behavior During Therapy: WFL for tasks assessed/performed Overall Cognitive Status: History of cognitive impairments - at baseline Area of Impairment: Safety/judgement;Memory;Problem solving;Orientation;Attention;Following commands;Awareness                 Orientation Level:  (NT) Current Attention Level: Selective Memory: Decreased short-term memory Following Commands: Follows one step commands consistently Safety/Judgement: Decreased awareness of safety;Decreased awareness of deficits Awareness: Intellectual Problem Solving: Requires verbal cues;Slow processing;Difficulty sequencing General Comments: Pleasant and cooperative      Exercises      General Comments        Pertinent Vitals/Pain Pain Assessment: Faces Faces Pain Scale: Hurts a little bit Pain Location: back  Pain Descriptors / Indicators: Aching Pain Intervention(s): Limited activity  within patient's tolerance;Repositioned    Home Living                      Prior Function            PT Goals (current goals can now be found in the care plan section) Acute Rehab PT Goals Patient Stated Goal: not stated PT Goal Formulation: Patient unable to participate in goal setting Time For Goal Achievement: 06/29/21 Potential to Achieve Goals: Fair Progress towards PT goals: Not progressing toward goals - comment (discharging from PT)    Frequency           PT Plan Discharge plan needs to be updated    Co-evaluation              AM-PAC PT "6 Clicks" Mobility   Outcome Measure  Help needed turning from your back to your side while in a flat bed without using bedrails?: None Help needed moving from lying on your back to sitting on the side of a flat bed without using bedrails?: None Help needed moving to and from a bed to a chair (including a wheelchair)?: A Little Help needed standing up from a chair using your arms (e.g., wheelchair or bedside chair)?: A Little Help needed to walk in hospital room?: A Little Help needed climbing 3-5 steps with a railing? : A Little 6 Click Score: 20    End of Session Equipment Utilized During Treatment: Gait belt Activity Tolerance: Patient tolerated treatment well Patient left: with call bell/phone within reach;in bed;with bed alarm set   PT Visit Diagnosis: Unsteadiness on feet (R26.81);Other abnormalities of gait and mobility (R26.89);Difficulty in walking, not elsewhere classified (R26.2) Pain - Right/Left:  (midline) Pain - part of body:  (low back)   Patient is being discharged from PT services secondary to:   Patient has made no progress toward goals in a reasonable time frame.   Please see latest Therapy Progress Note for current level of functioning and progress toward goals.  Progress and discharge plan and discussed with patient/caregiver and they   Patient unable to participate in discharge  planning    Time: 1139-1159 PT Time Calculation (min) (ACUTE ONLY): 20 min  Charges:  $Gait Training: 8-22 mins                      Arby Barrette, PT Pager [phone]    Rexanne Mano 06/22/2021, 12:22 PM

## 2021-06-22 NOTE — Plan of Care (Signed)
  Problem: Education: Goal: Knowledge of General Education information will improve Description Including pain rating scale, medication(s)/side effects and non-pharmacologic comfort measures Outcome: Progressing   Problem: Health Behavior/Discharge Planning: Goal: Ability to manage health-related needs will improve Outcome: Progressing   

## 2021-06-22 NOTE — Progress Notes (Signed)
CSW provided Charles Fox with petitions to file for guardianship of patient.  CSW spoke with Charles Fox of [facility] to provide her with an update.  Charles Fox, MSW, LCSW Transitions of Care  Clinical Social Worker II [phone]

## 2021-06-22 NOTE — Telephone Encounter (Signed)
This encounter was created in error - please disregard.

## 2021-06-22 NOTE — Progress Notes (Signed)
FPTS Brief Progress Note  S:Patient denies HA or any pain. Patient states he just quit smoking. Denies any other concerns at this time.    O: BP (!) 176/94 (BP Location: Left Arm)   Pulse 99   Temp 97.8 F (36.6 C) (Oral)   Resp 17   Ht 5\' 8"  (1.727 m)   Wt 101.8 kg   SpO2 96%   BMI 34.12 kg/m   Gen: male appearing stated age in NAD, able to communicate clearly, moving all extremities spontaneously    A/P: Patient resting in bed with no acute changes  - Orders reviewed. Labs for week ordered, which was adjusted as needed.  - If condition changes, plan includes head CT.  - continue to monitor for development of neurolgical symptoms in setting of recent fall   Simmons-Robinson, Riki Sheer, MD 06/22/2021, 12:00 AM PGY-3, [facility] Night Resident  Please page [phone] with questions.

## 2021-06-22 NOTE — Progress Notes (Addendum)
Occupational Therapy Treatment Patient Details Name: Charles Fox MRN: [id number] DOB: [birth year] Today's Date: 06/22/2021   History of present illness Pt 70 yr old male who presented on 05/07/21 with AMS and hematuria/urinary retention (foley placed 8/15), diagnosis of UTI. Noted per MD notes dementia with intermittent agitation. PMH includes diabetes, CVA  hypertension, hyperlipidemia, homelessness, tobacco use disorder.   OT comments  Pt received sitting EOB. Pt in great spirits this session, joking appropriately with therapist and agreeable to OT session. Pt currently requires supervision for completion of ADL and functional mobility. Given pt's cognitive deficits, it is not likely that pt will achieve modified independence level for OT goals. Pt's current level of supervision is adequate for d/c as pt will need long-term care given cognition. All OT needs addressed, pt with no additional OT needs at this time. OT will sign off. Should pt have a change in status, please re-order.    Recommendations for follow up therapy are one component of a multi-disciplinary discharge planning process, led by the attending physician.  Recommendations may be updated based on patient status, additional functional criteria and insurance authorization.    Follow Up Recommendations  SNF;Supervision/Assistance - 24 hour (LTC)    Equipment Recommendations  None recommended by OT    Recommendations for Other Services      Precautions / Restrictions Precautions Precautions: Fall Restrictions Weight Bearing Restrictions: No       Mobility Bed Mobility Overal bed mobility: Modified Independent             General bed mobility comments: pt sitting EOB upon arrival    Transfers Overall transfer level: Needs assistance Equipment used: Ambulation equipment used Transfers: Sit to/from [facility] Sit to Stand: Supervision Stand pivot transfers: Supervision       General  transfer comment: supervision for safety and cues for proper use of Rollator. Pt ambulated in hallway, around nurses station and back to room with supervision    Balance Overall balance assessment: Needs assistance Sitting-balance support: No upper extremity supported;Feet supported Sitting balance-Leahy Scale: Good Sitting balance - Comments: independent with sitting balance   Standing balance support: No upper extremity supported;During functional activity;Bilateral upper extremity supported Standing balance-Leahy Scale: Fair Standing balance comment: rollator for ambulation                           ADL either performed or assessed with clinical judgement   ADL Overall ADL's : Needs assistance/impaired                                       General ADL Comments: pt requires supervision for ADL completion, pt limited by cognitive deficits.     Vision   Vision Assessment?: No apparent visual deficits   Perception     Praxis      Cognition Arousal/Alertness: Awake/alert Behavior During Therapy: WFL for tasks assessed/performed Overall Cognitive Status: History of cognitive impairments - at baseline                                 General Comments: pt very pleasant and in good spirits this date. Pt was very cooperative and joking appropriately with therapist        Exercises     Shoulder Instructions       General  Comments VSS    Pertinent Vitals/ Pain       Pain Assessment: No/denies pain Pain Intervention(s): Monitored during session  Home Living Family/patient expects to be discharged to:: Assisted living                                        Prior Functioning/Environment              Frequency  Min 2X/week        Progress Toward Goals  OT Goals(current goals can now be found in the care plan section)  Progress towards OT goals: Goals drowngraded-see care plan  Acute Rehab OT  Goals Patient Stated Goal: not stated OT Goal Formulation: With patient Time For Goal Achievement: 06/21/21 Potential to Achieve Goals: Fair ADL Goals Pt Will Perform Grooming: standing;with supervision Pt Will Perform Upper Body Bathing: sitting;with supervision Pt Will Perform Lower Body Bathing: sit to/from stand;with supervision Pt Will Perform Upper Body Dressing: sitting;with supervision Pt Will Perform Lower Body Dressing: sit to/from stand;with supervision Pt Will Transfer to Toilet: ambulating;with supervision Pt Will Perform Toileting - Clothing Manipulation and hygiene: sit to/from stand;with supervision  Plan Discharge plan remains appropriate    Co-evaluation                 AM-PAC OT "6 Clicks" Daily Activity     Outcome Measure   Help from another person eating meals?: None Help from another person taking care of personal grooming?: A Little Help from another person toileting, which includes using toliet, bedpan, or urinal?: A Little Help from another person bathing (including washing, rinsing, drying)?: A Little Help from another person to put on and taking off regular upper body clothing?: A Little Help from another person to put on and taking off regular lower body clothing?: A Little 6 Click Score: 19    End of Session Equipment Utilized During Treatment: Other (comment) (rollator)  OT Visit Diagnosis: Unsteadiness on feet (R26.81)   Activity Tolerance Patient tolerated treatment well   Patient Left in bed;with call bell/phone within reach;with bed alarm set   Nurse Communication Mobility status        Time: 0962-8366 OT Time Calculation (min): 12 min  Charges: OT General Charges $OT Visit: 1 Visit OT Treatments $Self Care/Home Management : 8-22 mins  Helene Kelp OTR/L Acute Rehabilitation Services Office: [phone]   Wyn Forster 06/22/2021, 4:40 PM

## 2021-06-22 NOTE — Progress Notes (Signed)
Family Medicine Teaching Service Daily Progress Note Intern Pager: [phone]  Patient name: Charles Fox Medical record number: [id number] Date of birth: 07-22-1951 Age: 70 y.o. Gender: male  Primary Care Provider: [name], MD Consultants: none Code Status: FULL  [name]:  8/19: Admitted for altered mental status and hematuria 8/20: Normal brain MRI and improved mentation  8/21: [facility] 12/30, determined to not have capacity 8/22: Discussion with son revealed patient does not have a safe place for discharge 8/23: PSA elevated.  Urology recommending keeping catheter and rechecking PSA in 1 month. 8/24: Voiding trial 8/25: Suicide precautions, voiding trial failed, Foley reinserted 8/26: Suicide precautions removed 8/29 agitated and combative, delirium precautions in place 9/5: AKI 9/9: AKI resolved 9/18: Foley removed 9/30: reported SI, placed on suicide precautions with sitter 10/1: suicide precautions removed  Assessment and Plan: Charles Fox is a 70 y.o. male admitted for altered mental status and hematuria with concern for bladder malignancy.  Past medical history significant for hypertension, T2DM, HLD, tobacco use disorder, and homelessness.  Patient is awaiting guardianship and subsequent SNF placement.   Self-reported fall Patient more lethargic this morning and reports "something doesn't feel right". Neuro exam remarkable for difficulty following commands (more so than usual) and sluggish pupillary reflex. CT scan ordered. Has not received any sedating PRNs or new meds. Patient may simply be especially somnolent this morning, however his sluggish pupils are concerning. Orthostatic vital signs were positive yesterday 119/68->89/53. Patient likely is not taking in adequate PO. Our plan currently is to mobilize him more with nursing so that he will feel less of an urge to get up and move around the room unassisted, push PO fluids, and obtain a  CTH. -- CTH, will attempt without sedation -- Re-assess patient this afternoon -- Ambulate patient qshift -- Encourage PO fluids to prevent orthostasis -- PT to work with patient 2x/wk -- Oncologist, must re-order after 12 hrs -- Neuorchecks q4hrs  Urinary retention Possible bladder malignancy seen on CT. Previously placed foleys with patient clotting off multiple.  - Next CBC 10/10 - Low threshold to bladder scan - continue tamsulosin and finasteride - urology outpatient follow-up   Dementia Has intermittent agitation, mental status as above.  - quetiapine 150 mg qhs - ramelteon 8 mg qhs - recommend neurology outpatient follow-up for dementia   T2DM Stable. - metformin 500 mg daily   HTN Variable BP over the past 24 hours. Will continue to monitor.  - continue losartan 50 mg daily - continue carvedilol 3.125 mg BID   Tobacco use - nicotine patch 7 mg   FEN/GI: Heart healthy/carb modified diet PPx: Enoxaparin  Subjective:  On interview this morning, patient is more lethargic than previous days. He has more difficulty following commands than his baseline. Denies headache but says "I do not feel right". Physical exam as below.   Objective: Temp:  [97.6 F (36.4 C)-97.9 F (36.6 C)] 97.9 F (36.6 C) (10/04 0352) Pulse Rate:  [81-106] 81 (10/04 0352) Resp:  [16-19] 19 (10/04 0352) BP: (88-176)/(57-94) 122/70 (10/04 0352) SpO2:  [90 %-100 %] 100 % (10/04 0352) Physical Exam Vitals reviewed.  Cardiovascular:     Rate and Rhythm: Normal rate and regular rhythm.  Pulmonary:     Effort: Pulmonary effort is normal.     Breath sounds: Normal breath sounds.  Abdominal:     General: Bowel sounds are normal.     Palpations: Abdomen is soft.  Tenderness: There is no abdominal tenderness.  Neurological:     Comments: Patient's mental status is at baseline (alert and oriented to person and place but not time), however, he is more lethargic than normal.  Strength still  5/5 in UE and LE. Pupils sluggish.      Laboratory: Recent Labs  Lab 06/21/21 0023  WBC 8.5  HGB 13.2  HCT 40.6  PLT 295   No results for input(s): NA, K, CL, CO2, BUN, CREATININE, CALCIUM, PROT, BILITOT, ALKPHOS, ALT, AST, GLUCOSE in the last 168 hours.  Invalid input(s): LABALBU   Imaging/Diagnostic Tests: None new   Corky Sox PGY-1, Psychiatry FPTS Intern pager: [phone], text pages welcome

## 2021-06-23 DIAGNOSIS — F02C18 Dementia in other diseases classified elsewhere, severe, with other behavioral disturbance: Secondary | ICD-10-CM | POA: Diagnosis not present

## 2021-06-23 DIAGNOSIS — R4182 Altered mental status, unspecified: Secondary | ICD-10-CM | POA: Diagnosis not present

## 2021-06-23 DIAGNOSIS — G309 Alzheimer's disease, unspecified: Secondary | ICD-10-CM | POA: Diagnosis not present

## 2021-06-23 NOTE — Progress Notes (Signed)
FPTS Brief Progress Note  S:Patient resting comfortably at time of assessment.   O: BP 136/80 (BP Location: Left Arm)   Pulse 79   Temp 98.2 F (36.8 C) (Oral)   Resp 19   Ht 5\' 8"  (1.727 m)   Wt 101.8 kg   SpO2 100%   BMI 34.12 kg/m   Gen: male sleeping in bed, normal WOB on RA, tele sitter in place   A/P: Male admitted for AMS, awaiting safe disposition planning. Head CT obtained earlier for fall, results showed no acute abnormalities.  - Orders reviewed. Labs for AM ordered, which was adjusted as needed.   Eulis Foster, MD 06/23/2021, 4:24 AM PGY-3, [facility] Night Resident  Please page [phone] with questions.

## 2021-06-24 ENCOUNTER — Encounter (HOSPITAL_COMMUNITY): Payer: Self-pay | Admitting: Family Medicine

## 2021-06-24 DIAGNOSIS — E1149 Type 2 diabetes mellitus with other diabetic neurological complication: Secondary | ICD-10-CM | POA: Diagnosis not present

## 2021-06-24 DIAGNOSIS — E119 Type 2 diabetes mellitus without complications: Secondary | ICD-10-CM

## 2021-06-24 DIAGNOSIS — F02C18 Dementia in other diseases classified elsewhere, severe, with other behavioral disturbance: Secondary | ICD-10-CM | POA: Diagnosis not present

## 2021-06-24 DIAGNOSIS — N320 Bladder-neck obstruction: Secondary | ICD-10-CM | POA: Diagnosis not present

## 2021-06-24 DIAGNOSIS — G309 Alzheimer's disease, unspecified: Secondary | ICD-10-CM | POA: Diagnosis not present

## 2021-06-24 DIAGNOSIS — Z59811 Housing instability, housed, with risk of homelessness: Secondary | ICD-10-CM

## 2021-06-24 DIAGNOSIS — R410 Disorientation, unspecified: Secondary | ICD-10-CM | POA: Diagnosis not present

## 2021-06-24 DIAGNOSIS — I10 Essential (primary) hypertension: Secondary | ICD-10-CM

## 2021-06-24 MED ORDER — MELATONIN 5 MG PO TABS
5.0000 mg | ORAL_TABLET | Freq: Every day | ORAL | Status: DC
  Administered 2021-06-24 – 2021-09-23 (×92): 5 mg via ORAL
  Filled 2021-06-24 (×93): qty 1

## 2021-06-24 NOTE — Progress Notes (Signed)
Family Medicine Teaching Service Daily Progress Note Intern Pager: [phone]  Patient name: Charles Fox Medical record number: [id number] Date of birth: 1951/03/10 Age: 70 y.o. Gender: male  Primary Care Provider: [name], MD Consultants: None  Code Status: Full  [name]:  8/19- admitted  8/19: Admitted for altered mental status and hematuria 8/20: Normal brain MRI and improved mentation  8/21: [facility] 12/30, determined to not have capacity 8/22: Discussion with son revealed patient does not have a safe place for discharge 8/23: PSA elevated.  Urology recommending keeping catheter and rechecking PSA in 1 month. 8/24: Voiding trial 8/25: Suicide precautions, voiding trial failed, Foley reinserted 8/26: Suicide precautions removed 8/29 agitated and combative, delirium precautions in place 9/5: AKI 9/9: AKI resolved 9/18: Foley removed 9/30: reported SI, placed on suicide precautions with sitter 10/1: suicide precautions removed 10/3: Reported fall  Assessment and Plan:  Charles Fox is a 70 y.o male with hx of dementia is admitted for AMS and hematuria concerning for bladder cancer. PMH is significant for Dementia, T2DM, HTN, hld, tobacco use and homelessness. Patient is at baseline and medically stable for discharge to SNF.   Dementia  AMS Patient has a pleasant affect this morning but still have some cognitive deficit due to his history of dementia.  He is oriented to person and place but not time.  Patient is medically stable and at his baseline. -Continue melatonin 5mg  QHS -Continue quetiapine 150mg  QHS  Urine retention   Hematuria Concerns for bladder cancer vs Catheter trauma. Will follow up with neurology outpatient. Pt had passed a void test and not on catheter currently -Continue tamsulosin -Continue finasteride plan -Urology to follow up outpatient -CBC to be done 10/10  Type 2 DM  -Continue home medication of 500 mg  daily  Hypertension Stable -Continue carvedilol 3.125 mg BID -Continue Losartan 50mg  daily  Homelessness Patient with dementia and lacks capacity to live independently or care for himself. SW is working on Hydrograph[employer] surveyor for guardianship.  FEN/GI: Heart Healthy diet PPx: Enoxaparin  Dispo:SNF  pending bed availability . Barriers include SW working on Insurance risk surveyor .   Subjective:  Charles Fox said he is doing well today and has no complaints at this time.  He said he was able to sleep well last night.  Denies having any headaches pain or shortness of breath.  Objective: Temp:  [97.3 F (36.3 C)-98.6 F (37 C)] 97.8 F (36.6 C) (10/06 1601) Pulse Rate:  [76-91] 89 (10/06 1601) Resp:  [17-19] 18 (10/06 1601) BP: (126-174)/(64-86) 126/64 (10/06 1601) SpO2:  [91 %-100 %] 96 % (10/06 1601) Physical Exam: General: Awake, laying in bed, NAD Cardiovascular: RRR, no murmurs, normal S1/S2 Respiratory: CTAB, normal WOB Abdomen: Soft, no distention, no tenderness, positive BS Extremities: No edema lower extremities, +2 radial and pedal pulses Neuro: Oriented x2, Pleasant affect.  Patient appears to be at his baseline.  Laboratory: Recent Labs  Lab 06/21/21 0023  WBC 8.5  HGB 13.2  HCT 40.6  PLT 295   No results for input(s): NA, K, CL, CO2, BUN, CREATININE, CALCIUM, PROT, BILITOT, ALKPHOS, ALT, AST, GLUCOSE in the last 168 hours.  Invalid input(s): LABALBU    Imaging/Diagnostic Tests: No new imaging   Alen Bleacher, MD 06/24/2021, 4:09 PM PGY-1, [facility] Intern pager: [phone], text pages welcome

## 2021-06-24 NOTE — Progress Notes (Signed)
Family Medicine Teaching Service Daily Progress Note Intern Pager: [phone]  Patient name: Charles Fox Medical record number: [id number] Date of birth: 1951-02-16 Age: 70 y.o. Gender: male  Primary Care Provider: [name], MD Consultants: None Code Status: Full  [name] is a 70 year old male admitted for AMS and hematuria concerning for bladder cancer. PMH include T2DM, HTN, homelessness and hx of tobacco use.   No events overnight and had no complaints this morning    Assessment and Plan:  Recent self reported fall Patient have not had any falls since his onetime isolated self reported fall. He denies having any pain, or dizziness. On exam he is he has 5/5 muscle strength in all extremities and no visible signs of bruises. He is oriented to person and place but not to time which could be related to his history of dementia. -PT working with patient -patient will ambulate with nurse help -Encourage PO intake   Dementia No reported sundowning effect overnight. Patient had a normal affect, able to communicate appropriately and follow command. He is oriented to person and place but not time. Will switch patient from ramelteon to melatonin -Ordered Melatonin 5mg  QHS -Continue quetiapine 150 mg QHS  Constipation Patient report no bowel movement since Sunday. No recent stool recorded for patient. Per nurse he did have a BM this morning.   Urinary retention  reported Hematuria  Concerning for possible bladder cancer.  Plan is to follow-up outpatient urology -Continue tamsulosin -Continue finasteride plan -neurology to follow-up outpatient -CBC scheduled for 10/10  Type 2 DM -Continue metformin 500mg  daily   HTN Slightly elevated BP 142-149/76-84. Appropriate for DM patient. -Continue carvedilol 3.125 mg BID -Continue losartan 50 mg daily  Tobacco Currently on nicotine patch. Patient expressed interest in quitting  smoking.   Homelessness On admission patient reported living in his truck and estranged from most of his family.  Of note he currently lives alone; due to patient hx of demential he is incapable of living independently and caring for himself. Social work is following patient and have started the process for state appointed guardianship.   FEN/GI: Heart Healthy diet PPx: Enxaparin  Dispo: Awaiting SNF placement .   [name]  He is doing okay today however he has not been able to have good sleep overnight.  He said he was turning around most of the night but otherwise have no other complaints.  He expressed his desire to quit smoking and denies having any bowel movement recently.  Objective: Temp:  [97.7 F (36.5 C)-98.9 F (37.2 C)] 97.8 F (36.6 C) (10/06 0434) Pulse Rate:  [76-81] 76 (10/06 0434) Resp:  [17-18] 17 (10/06 0434) BP: (130-150)/(74-84) 149/84 (10/06 0434) SpO2:  [91 %-100 %] 92 % (10/06 0434) Physical Exam: General: Awake, laying in bed, NAD Cardiovascular: RRR, no murmurs, normal S1/S2 Respiratory: CTAB, normal work of breathing Abdomen: Soft, no distention, no tenderness, positive BS Extremities: No edema on LE, +2 radial and pedal pulses Neuro: Oriented x2 (person and place), poor p.o. reactive to light, CN II to XII intact  Laboratory: Recent Labs  Lab 06/21/21 0023  WBC 8.5  HGB 13.2  HCT 40.6  PLT 295     Imaging/Diagnostic Tests: No imaging  Alen Bleacher, MD 06/24/2021, 7:07 AM PGY-1, [place] Intern pager: [phone], text pages welcome

## 2021-06-24 NOTE — Progress Notes (Signed)
FPTS Brief Note Reviewed patient's vitals, recent notes.  Vitals:   06/23/21 1512 06/23/21 1954  BP: (!) 147/74 (!) 142/76  Pulse: 80 81  Resp: 18 17  Temp: 98.9 F (37.2 C) 98.6 F (37 C)  SpO2: 97% 100%   At this time, no change in plan from day progress note.  Orvis Brill, DO Page [phone] with questions about this patient.

## 2021-06-25 DIAGNOSIS — R410 Disorientation, unspecified: Secondary | ICD-10-CM | POA: Diagnosis not present

## 2021-06-25 DIAGNOSIS — E1149 Type 2 diabetes mellitus with other diabetic neurological complication: Secondary | ICD-10-CM | POA: Diagnosis not present

## 2021-06-25 DIAGNOSIS — F02C18 Dementia in other diseases classified elsewhere, severe, with other behavioral disturbance: Secondary | ICD-10-CM | POA: Diagnosis not present

## 2021-06-25 DIAGNOSIS — G309 Alzheimer's disease, unspecified: Secondary | ICD-10-CM | POA: Diagnosis not present

## 2021-06-25 DIAGNOSIS — N320 Bladder-neck obstruction: Secondary | ICD-10-CM | POA: Diagnosis not present

## 2021-06-25 NOTE — Progress Notes (Signed)
FPTS Brief Note Reviewed patient's vitals, recent notes.  Vitals:   06/24/21 1959 06/25/21 0105  BP: (!) 152/81 119/69  Pulse: 79 83  Resp: 18 20  Temp: 97.8 F (36.6 C) 98.2 F (36.8 C)  SpO2: 94% 94%   At this time, no change in plan from day progress note.  Orvis Brill, DO Page [phone] with questions about this patient.

## 2021-06-25 NOTE — Progress Notes (Signed)
Family Medicine Teaching Service Daily Progress Note Intern Pager: [phone]  Patient name: Charles Fox Medical record number: [id number] Date of birth: [birth year] Age: 70 y.o. Gender: male  Primary Care Provider: [name], MD Consultants: None  Code Status: Full  [name]:  8/19- admitted  8/19: Admitted for altered mental status and hematuria 8/20: Normal brain MRI and improved mentation  8/21: [facility] 12/30, determined to not have capacity 8/22: Discussion with son revealed patient does not have a safe place for discharge 8/23: PSA elevated.  Urology recommending keeping catheter and rechecking PSA in 1 month. 8/24: Voiding trial 8/25: Suicide precautions, voiding trial failed, Foley reinserted 8/26: Suicide precautions removed 8/29 agitated and combative, delirium precautions in place 9/5: AKI 9/9: AKI resolved 9/18: Foley removed 9/30: reported SI, placed on suicide precautions with sitter 10/1: suicide precautions removed 10/3: Reported   Assessment and Plan:  Charles Fox is a 70 y.o male with hx of dementia is admitted for AMS and hematuria concerning for bladder cancer. PMH is significant for Dementia, T2DM, HTN, hld, tobacco use and homelessness. Patient is at baseline and medically stable for discharge to SNF.   Dementia  AMS Patient has a pleasant affect this morning but still have some cognitive deficit due to his history of dementia.  He is oriented to person and place but not time.  Patient is medically stable and at his baseline. -Continue melatonin 5mg  QHS -Continue quetiapine 150mg  QHS   Urine retention   Hematuria Concerns for bladder cancer vs Catheter trauma. Will follow up with neurology outpatient. Pt had passed a void test and not on catheter currently -Continue tamsulosin -Continue finasteride plan -Urology to follow up outpatient -CBC to be done 10/10   Type 2 DM  -Continue home medication of 500 mg daily    Hypertension Stable -Continue carvedilol 3.125 mg BID -Continue Losartan 50mg  daily   Homelessness Patient with dementia and lacks capacity to live independently or care for himself. SW is working on Hydrographic surveyor for guardianship.  FEN/GI: Heart Healthy diet PPx: Enoxaparin  Dispo:SNF  pending bed availability . Barriers include SW working on Insurance risk surveyor .     Subjective:  [name] said he is doing well today and has no complaints at this time.  He said he was able to sleep well last night.  Denies having any headaches pain or shortness of breath.  Objective: Temp:  [97.3 F (36.3 C)-98.6 F (37 C)] 97.8 F (36.6 C) (10/06 1601) Pulse Rate:  [76-91] 89 (10/06 1601) Resp:  [17-19] 18 (10/06 1601) BP: (126-174)/(64-86) 126/64 (10/06 1601) SpO2:  [91 %-100 %] 96 % (10/06 1601) Physical Exam: General: Awake, laying in bed, NAD Cardiovascular: RRR, no murmurs, normal S1/S2 Respiratory: CTAB, normal WOB Abdomen: Soft, no distention, no tenderness, positive BS Extremities: No edema lower extremities, +2 radial and pedal pulses Neuro: Oriented x2, Pleasant affect.  Patient appears to be at his baseline.  Laboratory: Recent Labs  Lab 06/21/21 0023  WBC 8.5  HGB 13.2  HCT 40.6  PLT 295   No results for input(s): NA, K, CL, CO2, BUN, CREATININE, CALCIUM, PROT, BILITOT, ALKPHOS, ALT, AST, GLUCOSE in the last 168 hours.  Invalid input(s): LABALBU    Imaging/Diagnostic Tests: No new imagine    Progress note for today was erroneously entered for yesterday (06/24/21). This progress note has been forwarded to reflect today's date (06/25/21). The labs, vitals and physical exams were performed 06/25/21  Alen Bleacher,  MD 06/25/2021, 6:14 PM PGY-1,  Intern pager: [phone], text pages welcome

## 2021-06-26 DIAGNOSIS — E1149 Type 2 diabetes mellitus with other diabetic neurological complication: Secondary | ICD-10-CM | POA: Diagnosis not present

## 2021-06-26 DIAGNOSIS — G309 Alzheimer's disease, unspecified: Secondary | ICD-10-CM | POA: Diagnosis not present

## 2021-06-26 DIAGNOSIS — R410 Disorientation, unspecified: Secondary | ICD-10-CM | POA: Diagnosis not present

## 2021-06-26 DIAGNOSIS — N320 Bladder-neck obstruction: Secondary | ICD-10-CM | POA: Diagnosis not present

## 2021-06-26 DIAGNOSIS — F02C18 Dementia in other diseases classified elsewhere, severe, with other behavioral disturbance: Secondary | ICD-10-CM | POA: Diagnosis not present

## 2021-06-26 NOTE — Progress Notes (Signed)
FPTS Brief Note Reviewed patient's vitals, recent notes.  Vitals:   06/26/21 1519 06/26/21 2039  BP: (!) 146/80 (!) 114/98  Pulse: 80 86  Resp: 18 19  Temp:  97.7 F (36.5 C)  SpO2: 96% 100%   At this time, no change in plan from day progress note. Tele-sitter order renewed.  Sharion Settler, DO Page [phone] with questions about this patient.

## 2021-06-26 NOTE — Progress Notes (Signed)
Family Medicine Teaching Service Daily Progress Note Intern Pager: [phone]  Patient name: Charles Fox Medical record number: [id number] Date of birth: 1951-08-15 Age: 70 y.o. Gender: male  Primary Care Provider: [name], MD Consultants: None Code Status: FULL  [name]:  8/19: Admitted for altered mental status and hematuria 8/20: Normal brain MRI and improved mentation  8/21: [facility] 12/30, determined to not have capacity 8/22: Discussion with son revealed patient does not have a safe place for discharge 8/23: PSA elevated.  Urology recommending keeping catheter and rechecking PSA in 1 month. 8/24: Voiding trial 8/25: Suicide precautions, voiding trial failed, Foley reinserted 8/26: Suicide precautions removed 8/29 agitated and combative, delirium precautions in place 9/5: AKI 9/9: AKI resolved 9/18: Foley removed 9/30: reported SI, placed on suicide precautions with sitter 10/1: suicide precautions removed 10/3: Self reported fall  Assessment and Plan: Charles Fox is an 70 y.o. male who was admitted in August for AMS and hematuria. He continues to be hospitalized due to barriers with disposition as patient does not have capacity and is awaiting guardianship prior to SNF placement. His PMHx is significant for dementia, T2DM, HTN, HLD, and housing insecurity.  AMS 2/2 Dementia Tele-sitter was renewed last night (10/8). Patient is unsure about where he is this morning or why is here.  His main concern is how he is in a pays bills.. Was calm and redirectable overnight.  -Continue Melatonin 5 mg QHS -Continue Seroquel 150 mg QHS -Continue Tele-sitter -Delirium precautions  Urinary Retention  Hematuria   Foley removed on 9/18. He is voiding appropriately. No recurrence of hematuria. -F/u with urology outpatient -Continue Tamsulosin -Continue Finasteride  -Weekly CBC; next on 10/10  Type 2 DM: Chronic, stable -Continue Metformin 500 mg  BID   Hypertension: Chronic, stable -Continue Losartan 50 mg daily -Continue Carvedilol 3.125 mg BID   Housing Insecurity  Appreciate TOC's assistance with guardianship. Will need this prior to d/c to SNF.   FEN/GI: Heart healthy/carb modified PPx: Lovenox Dispo:SNF  pending guardianship and bed availability . Barriers include guardianship.   Subjective:  Patient's main concern is how he will pay his bills. He is unsure where he or how long he has been here. States it is 103. He has no concerns. States it is voiding/stooling appropriately.   Objective: Temp:  [97.5 F (36.4 C)-98 F (36.7 C)] 97.7 F (36.5 C) (10/08 2039) Pulse Rate:  [80-86] 86 (10/08 2039) Resp:  [16-19] 19 (10/08 2039) BP: (114-146)/(77-98) 114/98 (10/08 2039) SpO2:  [96 %-100 %] 100 % (10/08 2039) Physical Exam: General: Awake, alert, cooperative, not oriented to place, time, or situation Cardiovascular: RRR without murmur Respiratory: CTAB Abdomen: soft, non-distended, non-tender, without R/G Extremities: No edema  Laboratory: Recent Labs  Lab 06/21/21 0023  WBC 8.5  HGB 13.2  HCT 40.6  PLT 295   No results for input(s): NA, K, CL, CO2, BUN, CREATININE, CALCIUM, PROT, BILITOT, ALKPHOS, ALT, AST, GLUCOSE in the last 168 hours.  Invalid input(s): LABALBU   Imaging/Diagnostic Tests: No results found.   Sharion Settler, DO 06/26/2021, 11:13 PM PGY-2, [place] Intern pager: [phone], text pages welcome

## 2021-06-26 NOTE — Progress Notes (Signed)
Patient woke up in the middle of the night disoriented and not aware that he was in the hospital. Patient was told that he was in the hospital and his response was, " I wasn't aware".

## 2021-06-26 NOTE — Progress Notes (Addendum)
Family Medicine Teaching Service Daily Progress Note Intern Pager: [phone]  Patient name: Charles Fox Medical record number: [id number] Date of birth: 12-29-1950 Age: 70 y.o. Gender: male  Primary Care Provider: [name], MD Consultants: None Code Status: Full  [name]:  8/19: Admitted for altered mental status and hematuria 8/20: Normal brain MRI and improved mentation  8/21: [facility] 12/30, determined to not have capacity 8/22: Discussion with son revealed patient does not have a safe place for discharge 8/23: PSA elevated.  Urology recommending keeping catheter and rechecking PSA in 1 month. 8/24: Voiding trial 8/25: Suicide precautions, voiding trial failed, Foley reinserted 8/26: Suicide precautions removed 8/29 agitated and combative, delirium precautions in place 9/5: AKI 9/9: AKI resolved 9/18: Foley removed 9/30: reported SI, placed on suicide precautions with sitter 10/1: suicide precautions removed 10/3: Self reported fall  Assessment and Plan: Charles Fox is a 70 y.o male with hx of dementia who presented with AMS and hematuria concerning for bladder cancer. PMH is significant for Dementia, T2DM, HTN, HLD, and homelessness. Patient is at baseline and medically stable for discharge to SNF.    Dementia  AMS Overnight, he was disoriented and unaware he was in the hospital. RN re-oriented him. This morning, he is oriented to person, place. He was asking me if his father was deceased, and asked what time it was. I re-oriented him to time of day. He is stable and remains at his baseline. -Continue melatonin 5mg  QHS -Continue quetiapine 150mg  QHS -telemonitoring   Urine retention   Hematuria No catheter currently. Will follow outpatient with urology. -Continue tamsulosin -Continue finasteride plan -CBC on 10/10   Type 2 DM  Chronic and stable. -Continue home medication of 500 mg daily   Hypertension Stable -Continue Losartan  50mg  daily -Continue carvedilol 3.125 mg BID    Homelessness Patient has dementia and lacks capacity to live independently or care for himself. SW is working on Hydrographic surveyor for guardianship.  FEN/GI: Heart healthy diet PPx: Lovenox Dispo:SNF pending availability of bed. Barriers include guardianship.   Subjective:  Charles Fox has no complaints this morning. He was pleasantly demented and unaware of the time of day. He denied any complaints and any pain anywhere.  Objective: Temp:  [97.5 F (36.4 C)-98.4 F (36.9 C)] 97.5 F (36.4 C) (10/08 0501) Pulse Rate:  [73-96] 85 (10/08 0501) Resp:  [16-19] 16 (10/08 0501) BP: (109-135)/(58-84) 130/84 (10/08 0501) SpO2:  [94 %-98 %] 94 % (10/07 2300) Physical Exam: General: Resting comfortably in bed, in no distress Cardiovascular: RRR Respiratory: Normal work of breathing. No respiratory distresss. Abdomen: Soft, non-distended, non-tender Extremities: No edema. Well-perfused Neuro: Pleasantly demented. Alert to self, location  Laboratory: Recent Labs  Lab 06/21/21 0023  WBC 8.5  HGB 13.2  HCT 40.6  PLT 295   No new labs   Orvis Brill, DO 06/26/2021, 5:38 AM PGY-1, [place] Intern pager: [phone], text pages welcome

## 2021-06-26 NOTE — Progress Notes (Signed)
FPTS Brief Note Reviewed patient's vitals, recent notes.  Vitals:   06/25/21 2000 06/25/21 2300  BP: (!) 131/58 126/72  Pulse: 79 78  Resp: 19 18  Temp: 98 F (36.7 C) 98.3 F (36.8 C)  SpO2: 97% 94%   At this time, no change in plan from day progress note.  Orvis Brill, DO Page [phone] with questions about this patient.

## 2021-06-27 DIAGNOSIS — N320 Bladder-neck obstruction: Secondary | ICD-10-CM | POA: Diagnosis not present

## 2021-06-27 DIAGNOSIS — E1149 Type 2 diabetes mellitus with other diabetic neurological complication: Secondary | ICD-10-CM | POA: Diagnosis not present

## 2021-06-27 DIAGNOSIS — R410 Disorientation, unspecified: Secondary | ICD-10-CM | POA: Diagnosis not present

## 2021-06-27 DIAGNOSIS — F02C18 Dementia in other diseases classified elsewhere, severe, with other behavioral disturbance: Secondary | ICD-10-CM | POA: Diagnosis not present

## 2021-06-27 DIAGNOSIS — G309 Alzheimer's disease, unspecified: Secondary | ICD-10-CM | POA: Diagnosis not present

## 2021-06-27 NOTE — Progress Notes (Addendum)
Family Medicine Teaching Service Daily Progress Note Intern Pager: [phone]  Patient name: Charles Fox Medical record number: [id number] Date of birth: 11/18/1950 Age: 70 y.o. Gender: male  Primary Care Provider: [name], MD Consultants: None Code Status: Full  [name]:  8/19: Admitted for altered mental status and hematuria 8/20: Normal brain MRI and improved mentation  8/21: [facility] 12/30, determined to not have capacity 8/22: Discussion with son revealed patient does not have a safe place for discharge 8/23: PSA elevated.  Urology recommending keeping catheter and rechecking PSA in 1 month. 8/24: Voiding trial 8/25: Suicide precautions, voiding trial failed, Foley reinserted 8/26: Suicide precautions removed 8/29 agitated and combative, delirium precautions in place 9/5: AKI 9/9: AKI resolved 9/18: Foley removed 9/30: reported SI, placed on suicide precautions with sitter 10/1: suicide precautions removed 10/3: Self reported fall  Assessment and Plan:  Charles Fox is a 70 year old male with history of dementia presented with AMS and hematuria concerning for possible bladder cancer.  PMH is significant for dementia, T2DM, hypertension, and homelessness.  Patient is medically stable and at baseline but disposition is pending SNF placement and guardianship approval.  AMS secondary to Dementia Patient had no acute event overnight. He is medically stable and back to his baseline. Oriented to person and place but not to time. Had a pleasant affect this morning with no complaints at this time. -Continue melatonine 5 mg QHS -Continue seroquel 150 mg QHS  -Delirium precaution  Urinary retention  Hematuria Hgb today was 8.7. Last hgb a week ago was 13.2. MCV was 107 and platelet 64. There is concern for erroneous read, will repeat CBC.  -F/u repeat CBC -continue tamsulosin -continue finasteride  -Weekly CBC, next on 10/17 -f/u with urology  outpatient   Type 2 DM: chronic, stable -Continue Metformin 500 mg BID  Hypertension: Chronic, Stable -Continue Carvedilol 3.125 mg BID -Continue Losartan 50mg  daily  Home and social hx TOC currently working on securing guardianship for patient. Patient will require a guardian before he can be discharged to SNF. Guardianship application still in process. - f/u with CSW for updates    FEN/GI: Heart Health/ Carb Modified diet PPx: Levonox Dispo:SNF  pending guardianship approval and SNF placement. .   Subjective:  NAEON. Charles Fox said he is feeling good and has no complaint. He slept well through there night. Patient seem to be at his baseline and had a pleasant affect this morning. Expressed that he feels lonely, doesn't have anyone since his wife passed away.   Objective: Temp:  [97.7 F (36.5 C)-98.7 F (37.1 C)] 97.9 F (36.6 C) (10/09 1936) Pulse Rate:  [73-106] 92 (10/09 1936) Resp:  [17-19] 18 (10/09 1936) BP: (114-140)/(65-98) 123/75 (10/09 1936) SpO2:  [93 %-100 %] 96 % (10/09 1936) Physical Exam: General: Awake, sitting in bed, NAD Cardiovascular: RRR, no murmurs, normal S1/S2 Respiratory: CTAB, no wheezing, no crackles, normal WOB Abdomen: Soft, no distention, no tenderness Extremities: No edema LE bilaterally, +2 pedal and radial pulses Neuro: Oriented x2 (person and place), no neuro focal deficit  Laboratory: Recent Labs  Lab 06/21/21 0023  WBC 8.5  HGB 13.2  HCT 40.6  PLT 295   No results for input(s): NA, K, CL, CO2, BUN, CREATININE, CALCIUM, PROT, BILITOT, ALKPHOS, ALT, AST, GLUCOSE in the last 168 hours.  Invalid input(s): LABALBU    Imaging/Diagnostic Tests: No imaging  Alen Bleacher, MD 06/27/2021, 8:35 PM PGY-1, [place] Intern pager:  [phone], text pages welcome

## 2021-06-28 DIAGNOSIS — E1149 Type 2 diabetes mellitus with other diabetic neurological complication: Secondary | ICD-10-CM | POA: Diagnosis not present

## 2021-06-28 DIAGNOSIS — R319 Hematuria, unspecified: Secondary | ICD-10-CM | POA: Diagnosis not present

## 2021-06-28 DIAGNOSIS — F02C18 Dementia in other diseases classified elsewhere, severe, with other behavioral disturbance: Secondary | ICD-10-CM | POA: Diagnosis not present

## 2021-06-28 DIAGNOSIS — R4182 Altered mental status, unspecified: Secondary | ICD-10-CM | POA: Diagnosis not present

## 2021-06-28 DIAGNOSIS — R339 Retention of urine, unspecified: Secondary | ICD-10-CM | POA: Diagnosis not present

## 2021-06-28 DIAGNOSIS — G309 Alzheimer's disease, unspecified: Secondary | ICD-10-CM | POA: Diagnosis not present

## 2021-06-28 DIAGNOSIS — F0390 Unspecified dementia without behavioral disturbance: Secondary | ICD-10-CM | POA: Diagnosis not present

## 2021-06-28 DIAGNOSIS — Z658 Other specified problems related to psychosocial circumstances: Secondary | ICD-10-CM | POA: Diagnosis not present

## 2021-06-28 LAB — CBC
HCT: 26.1 % — ABNORMAL LOW (ref 39.0–52.0)
HCT: 41 % (ref 39.0–52.0)
Hemoglobin: 8.7 g/dL — ABNORMAL LOW (ref 13.0–17.0)
Hemoglobin: 13.6 g/dL (ref 13.0–17.0)
MCH: 35.7 pg — ABNORMAL HIGH (ref 26.0–34.0)
MCH: 28.8 pg (ref 26.0–34.0)
MCHC: 33.3 g/dL (ref 30.0–36.0)
MCHC: 33.2 g/dL (ref 30.0–36.0)
MCV: 107 fL — ABNORMAL HIGH (ref 80.0–100.0)
MCV: 86.9 fL (ref 80.0–100.0)
Platelets: 64 10*3/uL — ABNORMAL LOW (ref 150–400)
Platelets: 329 10*3/uL (ref 150–400)
RBC: 2.44 MIL/uL — ABNORMAL LOW (ref 4.22–5.81)
RBC: 4.72 MIL/uL (ref 4.22–5.81)
RDW: 17.2 % — ABNORMAL HIGH (ref 11.5–15.5)
RDW: 14 % (ref 11.5–15.5)
WBC: 7.5 10*3/uL (ref 4.0–10.5)
WBC: 8.7 10*3/uL (ref 4.0–10.5)
nRBC: 0 % (ref 0.0–0.2)
nRBC: 0 % (ref 0.0–0.2)

## 2021-06-28 NOTE — Progress Notes (Addendum)
Family Medicine Teaching Service Daily Progress Note Intern Pager: [phone]  Patient name: Charles Fox Medical record number: [id number] Date of birth: 1951/03/13 Age: 70 y.o. Gender: male  Primary Care Provider: [name], MD Consultants: None Code Status: Full  [name]:  8/19: Admitted for altered mental status and hematuria 8/20: Normal brain MRI and improved mentation  8/21: [facility] 12/30, determined to not have capacity 8/22: Discussion with son revealed patient does not have a safe place for discharge 8/23: PSA elevated.  Urology recommending keeping catheter and rechecking PSA in 1 month. 8/24: Voiding trial 8/25: Suicide precautions, voiding trial failed, Foley reinserted 8/26: Suicide precautions removed 8/29 agitated and combative, delirium precautions in place 9/5: AKI 9/9: AKI resolved 9/18: Foley removed 9/30: reported SI, placed on suicide precautions with sitter 10/1: suicide precautions removed 10/3: Self reported fall  Assessment and Plan:  Charles Fox. [place] ia a 70 year old male admitted for AMS and hematuria concerning for possible bladder cancer. PMH is significant for dementia, T2DM, HTN and homelessness. Patient is medically stable and awaiting SNF placement.  AMS due to Dementia Patient symptom seems to wax and wean during his prolonged hospitalization. He expressed desire to go and sort out dispute with his son this morning. He is oriented to person and place not to time. He still believe it is 36. His vitals have remained stable and physical exam was normal. Still awaiting for SNF placement and guardianship application update.  -Continue melatonin 5 mg QHS -Continue Seroquel 150 mg QHS -Continue delirium precaution  Urinary retention  Hematuria -Continue tamsulosin -Continue finasteride -Weekly CBC, will next on 10/17 -F/U with urology outpatient  Type II DM: Chronic, stable -Continue metformin 500 mg twice  daily  Hypertension: Chronic, stable -Continue losartan 50 mg daily -Continue carvedilol 3.125 mg BID  Home and social Hx Patient with history of dementia lacks the capacity to care for himself. Social work updated notes that he contacted: Attorney to request an update regarding the guardianship petition filed last week.  FEN/GI: Heart healthy/carb modified diet PPx: Lovenox Dispo:SNF pending placement. Barriers include guardianship  approval.   Subjective:  NAEON. Patient said he didn't sleep well last night because of his relationship with his son. He denies having any pain and feels fine. No complaint at this time.  Objective: Temp:  [97.6 F (36.4 C)-98.4 F (36.9 C)] 98.4 F (36.9 C) (10/10 1602) Pulse Rate:  [74-92] 75 (10/10 1602) Resp:  [17-18] 17 (10/10 1602) BP: (111-144)/(67-82) 125/67 (10/10 1602) SpO2:  [96 %-98 %] 98 % (10/10 1205) Physical Exam: General: Awake, laying in bed, NAD Cardiovascular: RRR, no mumurs, normal S1/S2 Respiratory: CTAB, no wheezing or crackles, good WOB Abdomen: Soft, no distension or tenderness Extremities: No LE edema, +2 pedal and radial pulse   Laboratory: Recent Labs  Lab 06/28/21 0008 06/28/21 1233  WBC 7.5 8.7  HGB 8.7* 13.6  HCT 26.1* 41.0  PLT 64* 329   No results for input(s): NA, K, CL, CO2, BUN, CREATININE, CALCIUM, PROT, BILITOT, ALKPHOS, ALT, AST, GLUCOSE in the last 168 hours.  Invalid input(s): LABALBU    Imaging/Diagnostic Tests: No new imagining  Alen Bleacher, MD 06/28/2021, 4:11 PM PGY-1, [place] Intern pager: [phone], text pages welcome

## 2021-06-28 NOTE — Progress Notes (Signed)
CSW does not have any updates regarding placement for this patient.  CSW has contacted [facility] attorney to request an update regarding the guardianship petition that was filed last week.  [name], MSW, LCSW Transitions of Care  Clinical Social Worker II [phone]

## 2021-06-28 NOTE — Progress Notes (Signed)
FPTS Brief Note Reviewed patient's vitals, recent notes.  Vitals:   06/27/21 1638 06/27/21 1936  BP: 137/68 123/75  Pulse: 79 92  Resp: 18 18  Temp: 98.1 F (36.7 C) 97.9 F (36.6 C)  SpO2: 96% 96%   At this time, no change in plan from day progress note.  Eulis Foster, MD Page [phone] with questions about this patient.

## 2021-06-29 DIAGNOSIS — Z658 Other specified problems related to psychosocial circumstances: Secondary | ICD-10-CM | POA: Diagnosis not present

## 2021-06-29 DIAGNOSIS — F02C18 Dementia in other diseases classified elsewhere, severe, with other behavioral disturbance: Secondary | ICD-10-CM | POA: Diagnosis not present

## 2021-06-29 DIAGNOSIS — F0390 Unspecified dementia without behavioral disturbance: Secondary | ICD-10-CM | POA: Diagnosis not present

## 2021-06-29 DIAGNOSIS — E1149 Type 2 diabetes mellitus with other diabetic neurological complication: Secondary | ICD-10-CM | POA: Diagnosis not present

## 2021-06-29 DIAGNOSIS — R319 Hematuria, unspecified: Secondary | ICD-10-CM | POA: Diagnosis not present

## 2021-06-29 DIAGNOSIS — G309 Alzheimer's disease, unspecified: Secondary | ICD-10-CM | POA: Diagnosis not present

## 2021-06-29 DIAGNOSIS — R4182 Altered mental status, unspecified: Secondary | ICD-10-CM | POA: Diagnosis not present

## 2021-06-29 NOTE — Progress Notes (Signed)
FPTS Brief Note Reviewed patient's vitals, recent notes. No overnight events noted.  Vitals:   06/29/21 0141 06/29/21 0444  BP: 131/85 (!) 150/80  Pulse: 79 94  Resp: 19 19  Temp: 98.1 F (36.7 C) 97.7 F (36.5 C)  SpO2: 94% 94%   At this time, no change in plan from day progress note.  Patient awaiting legal paperwork for safe disposition planning.   Eulis Foster, MD Page [phone] with questions about this patient.

## 2021-06-29 NOTE — Progress Notes (Signed)
Patient's interim guardianship hearing is scheduled for 07/09/21 at 10am via Webex. The full hearing is scheduled for 11/30 at 2pm via Webex.  [name], MSW, LCSW Transitions of Care  Clinical Social Worker II [phone]

## 2021-06-29 NOTE — Progress Notes (Signed)
Family Medicine Teaching Service Daily Progress Note Intern Pager: [phone]  Patient name: Charles Fox Medical record number: [id number] Date of birth: 05-21-1951 Age: 70 y.o. Gender: male  Primary Care Provider: [name], MD Consultants: none Code Status: Full  [name]:  8/19: Admitted for altered mental status and hematuria 8/20: Normal brain MRI and improved mentation  8/21: MoCA 12/30, determined to not have capacity 8/22: Discussion with son revealed patient does not have a safe place for discharge 8/23: PSA elevated.  Urology recommending keeping catheter and rechecking PSA in 1 month. 8/24: Voiding trial 8/25: Suicide precautions, voiding trial failed, Foley reinserted 8/26: Suicide precautions removed 8/29 agitated and combative, delirium precautions in place 9/5: AKI 9/9: AKI resolved 9/18: Foley removed 9/30: reported SI, placed on suicide precautions with sitter 10/1: suicide precautions removed 10/3: Self reported fall  Assessment and Plan:  [name]. Charles Fox is a 70 year old male admitted for AMS and hematuria. PMH is significant for dementia, HTN, T2DM, dementia and homelessness. Pt is medically stable, awaiting SNF placement.  AMS due to Dementia Patient is at baseline and medically stable for discharge to SNF.  Today he's oriented to person and time but not place. This is different because he is usually oriented to person and place but not to time. He has a pleasant affect today and has no complaints.  -Continue melatonin 5 mg QHS -Reduce  Seroquel to 100 mg QHS -Continue delirium precaution  Urinary retention  Hematuria -Continue tamsulosin -Continue finasteride -Weekly CBC, will next on 10/17 -F/U with urology outpatient  Type II DM:  Chronic and stable -Continue metformin 500 mg twice daily  Hypertension  Chronic and stable -Continue losartan 50 mg daily -Continue carvedilol 3.125 mg BID  Home and Social  Hx   FEN/GI: Heart Healthy/ Carb modified diet PPx: Levonox Dispo:SNF  pending placement . Barriers include Guardianship application pending.   Subjective:  NAEON. Patient said he is feeling good today and slept well last night. He has noi complaint and has a pleasant affect.   Objective: Temp:  [97.5 F (36.4 C)-98.4 F (36.9 C)] 97.5 F (36.4 C) (10/11 1303) Pulse Rate:  [75-94] 85 (10/11 1303) Resp:  [17-19] 19 (10/11 1303) BP: (125-150)/(63-118) 137/118 (10/11 1303) SpO2:  [93 %-97 %] 93 % (10/11 1303) Physical Exam: General: Awake, laying down, NAD Cardiovascular: RRR, No murmurs, Normal S1/S2 Respiratory: CTAB, Normal WOB on RA.  Abdomen: Soft, not distended, no tenderness Extremities: No edema, well perfused Neuro: Oriented x2 (person and time), No focal  deficit   Laboratory: Recent Labs  Lab 06/28/21 0008 06/28/21 1233  WBC 7.5 8.7  HGB 8.7* 13.6  HCT 26.1* 41.0  PLT 64* 329   No results for input(s): NA, K, CL, CO2, BUN, CREATININE, CALCIUM, PROT, BILITOT, ALKPHOS, ALT, AST, GLUCOSE in the last 168 hours.  Invalid input(s): LABALBU    Imaging/Diagnostic Tests: No new images   Alen Bleacher, MD 06/29/2021, 3:17 PM PGY-1, [place] Intern pager: [phone], text pages welcome

## 2021-06-30 DIAGNOSIS — E1149 Type 2 diabetes mellitus with other diabetic neurological complication: Secondary | ICD-10-CM | POA: Diagnosis not present

## 2021-06-30 DIAGNOSIS — G309 Alzheimer's disease, unspecified: Secondary | ICD-10-CM | POA: Diagnosis not present

## 2021-06-30 DIAGNOSIS — R4182 Altered mental status, unspecified: Secondary | ICD-10-CM | POA: Diagnosis not present

## 2021-06-30 DIAGNOSIS — Z658 Other specified problems related to psychosocial circumstances: Secondary | ICD-10-CM

## 2021-06-30 DIAGNOSIS — F02C18 Dementia in other diseases classified elsewhere, severe, with other behavioral disturbance: Secondary | ICD-10-CM | POA: Diagnosis not present

## 2021-06-30 DIAGNOSIS — E119 Type 2 diabetes mellitus without complications: Secondary | ICD-10-CM | POA: Diagnosis not present

## 2021-06-30 DIAGNOSIS — F0390 Unspecified dementia without behavioral disturbance: Secondary | ICD-10-CM | POA: Diagnosis not present

## 2021-06-30 DIAGNOSIS — R319 Hematuria, unspecified: Secondary | ICD-10-CM | POA: Diagnosis not present

## 2021-06-30 MED ORDER — QUETIAPINE FUMARATE 100 MG PO TABS
100.0000 mg | ORAL_TABLET | ORAL | Status: DC
  Administered 2021-06-30: 100 mg via ORAL
  Filled 2021-06-30: qty 1

## 2021-06-30 NOTE — Progress Notes (Signed)
FPTS Brief Note Reviewed patient's vitals, recent notes.  Vitals:   06/29/21 2040 06/30/21 0057  BP: 132/74 117/72  Pulse: 84 84  Resp: 19 19  Temp: 98.2 F (36.8 C) 97.6 F (36.4 C)  SpO2: 97% 92%   At this time, no change in plan from day progress note.   Eulis Foster, MD Page [phone] with questions about this patient.

## 2021-06-30 NOTE — Progress Notes (Addendum)
Family Medicine Teaching Service Daily Progress Note Intern Pager: [phone]  Patient name: Charles Fox Medical record number: [id number] Date of birth: 05-Sep-1951 Age: 70 y.o. Gender: male  Primary Care Provider: [name], MD Consultants: None Code Status: Full  [name]:  8/19: Admitted for altered mental status and hematuria 8/20: Normal brain MRI and improved mentation  8/21: MoCA 12/30, determined to not have capacity 8/22: Discussion with son revealed patient does not have a safe place for discharge 8/23: PSA elevated.  Urology recommending keeping catheter and rechecking PSA in 1 month. 8/24: Voiding trial 8/25: Suicide precautions, voiding trial failed, Foley reinserted 8/26: Suicide precautions removed 8/29 agitated and combative, delirium precautions in place 9/5: AKI 9/9: AKI resolved 9/18: Foley removed 9/30: reported SI, placed on suicide precautions with sitter 10/1: suicide precautions removed 10/3: Self reported fall  Assessment and Plan:  [name]. Charles Fox is a 70 year old male with history of dementia who presented with AMS and Hematuria concerning for Bladder cancer. PMH is significant for T2DM, HTN, dentia and Homelessness. Patient is medically stable and waiting for SNF placement. The hearing for his guardianship approval is scheduled for 07/09/21.   AMS  Dementia Patient  had no acute event overnight. He has remained medical stable and still at his baseline. Vital were normal. Patient is oriented to person and place but not time. Still have some cognitive deficits due to his dementia.  -Continue melatonin 5 mg QHS -Reduce  Seroquel to 100 mg QHS -Continue delirium precaution  Urinary retention  Hematuria -Continue tamsulosin -Continue finasteride -Weekly CBC, will next on 10/17 -F/U with urology outpatient  Type II DM:  Chronic and stable -Continue metformin 500 mg twice daily  Hypertension  Chronic and  stable -Continue losartan 50 mg daily -Continue carvedilol 3.125 mg BID   FEN/GI: Heart Healthy diet PPx: Levonox Dispo:SNF  pending placement . Barriers include guardianship approval.   Subjective:  NAEON. Patient said he slept well last night and has no pain or complaints. He had pleasant affect this morning.  Objective: Temp:  [97.6 F (36.4 C)-97.7 F (36.5 C)] 97.7 F (36.5 C) (10/12 1208) Pulse Rate:  [76-84] 77 (10/12 1617) Resp:  [17-19] 17 (10/12 1617) BP: (114-139)/(66-78) 139/78 (10/12 1617) SpO2:  [92 %-97 %] 94 % (10/12 1617) Physical Exam: General: Awake, sitting in bed, NAD Cardiovascular: RRR, no murmurs, normal S1/S2 Respiratory: CTAB, normal WOB on RA Abdomen: Soft, no distension, no tenderness Extremities: No edema LE, well perfused   Laboratory: Recent Labs  Lab 06/28/21 0008 06/28/21 1233  WBC 7.5 8.7  HGB 8.7* 13.6  HCT 26.1* 41.0  PLT 64* 329   No results for input(s): NA, K, CL, CO2, BUN, CREATININE, CALCIUM, PROT, BILITOT, ALKPHOS, ALT, AST, GLUCOSE in the last 168 hours.  Invalid input(s): LABALBU   Imaging/Diagnostic Tests: No new Images   Alen Bleacher, MD 06/30/2021, 8:41 PM PGY-1, [place] Intern pager: [phone], text pages welcome

## 2021-06-30 NOTE — Progress Notes (Signed)
FPTS Brief Note Reviewed patient's vitals, recent notes.  Vitals:   06/30/21 1617 06/30/21 2056  BP: 139/78 125/80  Pulse: 77 90  Resp: 17 20  Temp:  98.3 F (36.8 C)  SpO2: 94% 94%   At this time, no change in plan from day progress note.   Eulis Foster, MD Page [phone] with questions about this patient.

## 2021-07-01 DIAGNOSIS — I10 Essential (primary) hypertension: Secondary | ICD-10-CM | POA: Diagnosis not present

## 2021-07-01 DIAGNOSIS — F02C18 Dementia in other diseases classified elsewhere, severe, with other behavioral disturbance: Secondary | ICD-10-CM | POA: Diagnosis not present

## 2021-07-01 DIAGNOSIS — E119 Type 2 diabetes mellitus without complications: Secondary | ICD-10-CM | POA: Diagnosis not present

## 2021-07-01 DIAGNOSIS — G309 Alzheimer's disease, unspecified: Secondary | ICD-10-CM | POA: Diagnosis not present

## 2021-07-01 MED ORDER — QUETIAPINE FUMARATE 100 MG PO TABS
100.0000 mg | ORAL_TABLET | ORAL | Status: DC
  Administered 2021-07-01 – 2021-07-04 (×4): 100 mg via ORAL
  Filled 2021-07-01 (×4): qty 1

## 2021-07-01 MED ORDER — QUETIAPINE FUMARATE 50 MG PO TABS: 50.0000 mg | ORAL_TABLET | ORAL | Status: DC

## 2021-07-01 MED ORDER — PNEUMOCOCCAL VAC POLYVALENT 25 MCG/0.5ML IJ INJ
0.5000 mL | INJECTION | INTRAMUSCULAR | Status: AC
  Administered 2021-07-06: 0.5 mL via INTRAMUSCULAR
  Filled 2021-07-01 (×2): qty 0.5

## 2021-07-01 NOTE — Progress Notes (Signed)
Family Medicine Teaching Service Daily Progress Note Intern Pager: [phone]  Patient name: Charles Fox Medical record number: [id number] Date of birth: 01/05/1951 Age: 70 y.o. Gender: male  Primary Care Provider: [name], MD Consultants: None Code Status: Full  [name]:  8/19: Admitted for altered mental status and hematuria 8/20: Normal brain MRI and improved mentation  8/21: MoCA 12/30, determined to not have capacity 8/22: Discussion with son revealed patient does not have a safe place for discharge 8/23: PSA elevated.  Urology recommending keeping catheter and rechecking PSA in 1 month. 8/24: Voiding trial 8/25: Suicide precautions, voiding trial failed, Foley reinserted 8/26: Suicide precautions removed 8/29 agitated and combative, delirium precautions in place 9/5: AKI 9/9: AKI resolved 9/18: Foley removed 9/30: reported SI, placed on suicide precautions with sitter 10/1: suicide precautions removed 10/3: Self reported fall  Charles Fox is a 70 year old male with history of dementia who presented with AMS and Hematuria concerning for Bladder cancer. PMH is significant for T2DM, HTN, dentia and Homelessness. Patient is medically stable guardianship hearing is scheduled for 07/09/21.   Assessment and Plan:  AMS  Dementia Patient hs no acute evernt overnight. This is second day since reducing seroquel to 100mg . He has remained stable on the reduced dose and currently at baseline. -Continue melatonin 5 mg QHS -Reduce  Seroquel to 100 mg QHS -encourage ambulation with nurse   Urinary retention  Hematuria Patient has been voiding appropriately. -Continue finasteride -Continue tamsulosin -F/U with urology outpatient -Weekly CBC, will next on 10/17    Type II DM:  Chronic; stable -Continue metformin 500 mg twice daily   Hypertension  Chronic;  stable -Continue carvedilol 3.125 mg BID -Continue losartan 50 mg  daily  Home Insecurity Patient report being homeless on admission and sleeping in a van. Due to his dementia and inability to care for himself, patient would need someone to care for him after discharge. CSW is working on getting patient a guardian and the application is still in process. Hearing is scheduled for 07/09/21.   FEN/GI: Heart healthy diet PPx: SCD Dispo:SNF  pending guardianship approval .   Subjective:  NAEON. Patient said he is doing fine and slept well last night. He is oriented to person and time, not place. He has normal affect and appropriate today.  Objective: Temp:  [97.6 F (36.4 C)-98.5 F (36.9 C)] 98.2 F (36.8 C) (10/13 2100) Pulse Rate:  [72-90] 80 (10/13 2100) Resp:  [16-18] 16 (10/13 2100) BP: (124-146)/(60-78) 127/60 (10/13 2100) SpO2:  [94 %-100 %] 100 % (10/13 2100) Physical Exam: General: Awake, laying in bed, NAD Cardiovascular: RRR, No murmurs Respiratory: CTAB, normal WOB on RA Abdomen: soft, no tenderness or distension  Extremities: No edema, well perfused  Laboratory: Recent Labs  Lab 06/28/21 0008 06/28/21 1233  WBC 7.5 8.7  HGB 8.7* 13.6  HCT 26.1* 41.0  PLT 64* 329   No results for input(s): NA, K, CL, CO2, BUN, CREATININE, CALCIUM, PROT, BILITOT, ALKPHOS, ALT, AST, GLUCOSE in the last 168 hours.  Invalid input(s): LABALBU    Imaging/Diagnostic Tests: No imaging   Alen Bleacher, MD 07/01/2021, 10:25 PM PGY-1, [place] Intern pager: [phone], text pages welcome

## 2021-07-02 DIAGNOSIS — R319 Hematuria, unspecified: Secondary | ICD-10-CM | POA: Diagnosis not present

## 2021-07-02 DIAGNOSIS — F02C18 Dementia in other diseases classified elsewhere, severe, with other behavioral disturbance: Secondary | ICD-10-CM | POA: Diagnosis not present

## 2021-07-02 DIAGNOSIS — F0390 Unspecified dementia without behavioral disturbance: Secondary | ICD-10-CM | POA: Diagnosis not present

## 2021-07-02 DIAGNOSIS — G309 Alzheimer's disease, unspecified: Secondary | ICD-10-CM | POA: Diagnosis not present

## 2021-07-02 NOTE — Consult Note (Signed)
   [facility] Inpatient Consult   07/02/2021  Charles Fox [birth year] [id number]  Follow up:  [facility] Medicare/Embedded provider  Reviewed for progress, patient has an upcoming guardianship hearing noted per inpatient [facility] [name] notes for details] LCSW notes patient with DTP team.   Plan: Following  [name], RN BSN [facility] Liaison  [phone] business mobile phone Toll free office [phone]  Fax number: [phone] [name]

## 2021-07-02 NOTE — Progress Notes (Signed)
FPTS Interim Progress Note  S: Patient sleeping and resting comfortably.    O: BP (!) 100/57 (BP Location: Left Arm)   Pulse 75   Temp (!) 97.5 F (36.4 C) (Oral)   Resp 16   Ht 5\' 8"  (1.727 m)   Wt 101.8 kg   SpO2 100%   BMI 34.12 kg/m    RESP: equal chest rise and fall   A/P: No changes to current plan. Awaiting disposition. See daily progress note.    [name], DO PGY-3, [place] Intern pager [phone]

## 2021-07-03 DIAGNOSIS — R319 Hematuria, unspecified: Secondary | ICD-10-CM | POA: Diagnosis not present

## 2021-07-03 DIAGNOSIS — G309 Alzheimer's disease, unspecified: Secondary | ICD-10-CM | POA: Diagnosis not present

## 2021-07-03 DIAGNOSIS — F0390 Unspecified dementia without behavioral disturbance: Secondary | ICD-10-CM | POA: Diagnosis not present

## 2021-07-03 DIAGNOSIS — F02C18 Dementia in other diseases classified elsewhere, severe, with other behavioral disturbance: Secondary | ICD-10-CM | POA: Diagnosis not present

## 2021-07-03 NOTE — Progress Notes (Signed)
FPTS Brief Note Reviewed patient's vitals, recent notes.  Vitals:   07/02/21 2023 07/02/21 2336  BP: (!) 144/80 119/73  Pulse: 83 92  Resp: 18 17  Temp: 97.8 F (36.6 C) (!) 97.4 F (36.3 C)  SpO2: 96% 98%   At this time, no change in plan from day progress note.  [name], DO Page [phone] with questions about this patient.

## 2021-07-03 NOTE — Progress Notes (Signed)
Family Medicine Teaching Service Daily Progress Note Intern Pager: [phone]  Patient name: Charles Fox Medical record number: [id number] Date of birth: 06/15/1951 Age: 70 y.o. Gender: male  Primary Care Provider: [name], MD Consultants: None Code Status: FULL  [name]:  8/19: Admitted for altered mental status and hematuria 8/20: Normal brain MRI and improved mentation  8/21: MoCA 12/30, determined to not have capacity 8/22: Discussion with son revealed patient does not have a safe place for discharge 8/23: PSA elevated.  Urology recommending keeping catheter and rechecking PSA in 1 month. 8/24: Voiding trial 8/25: Suicide precautions, voiding trial failed, Foley reinserted 8/26: Suicide precautions removed 8/29 agitated and combative, delirium precautions in place 9/5: AKI 9/9: AKI resolved 9/18: Foley removed 9/30: reported SI, placed on suicide precautions with sitter 10/1: suicide precautions removed 10/3: Self reported fall  Assessment and Plan: Charles Fox is a 70 y.o. male with history of dementia who presented with AMS and Hematuria concerning for Bladder cancer. PMH is significant for T2DM, HTN, dentia and Homelessness. Patient is medically stable guardianship hearing is scheduled for 07/09/21.   Home Insecurity On admission, patient reported he slept in a van. Due to his dementia and inability to care for himself, patient would need someone to care for him after discharge. TOC team is working diligently on finding a safe disposition.  Pt has interim guardianship hearing on 07/09/21.  - appreciate CM and SW help with pt's disposition    AMS  Dementia Stable on reduced Seroquel dose. Ambulate with RN/tech. -Continue melatonin 5 mg QHS -Continue Seroquel to 100 mg QHS    Urinary retention  Hematuria Stable. PSA decreased 10-fold in the last month.  -Continue finasteride -Continue tamsulosin -F/U with urology  outpatient -Weekly CBC, will next on 10/17     Type II Diabetes mellitus  Stable -Continue metformin 500 mg twice daily -Consider BMP on next lab occurrence    Hypertension Stable -Continue carvedilol 3.125 mg BID -Continue losartan 50 mg daily     FEN/GI: heart healthy, carb modified diet  PPx: SCDs  Disposition: pending safe disposition   Subjective:  No significant overnight events.   Objective: Temp:  [97.4 F (36.3 C)-98.3 F (36.8 C)] 98.2 F (36.8 C) (10/15 0417) Pulse Rate:  [70-92] 81 (10/15 0417) Resp:  [16-20] 20 (10/15 0417) BP: (110-145)/(68-80) 110/73 (10/15 0417) SpO2:  [93 %-98 %] 97 % (10/15 0417)   Physical Exam: General: resting in bed comfortably Cardiovascular: regular, rate  Respiratory: clear to ascultation bilaterally Abdomen: soft, non-tender Extremities: no LE edema  Laboratory: Recent Labs  Lab 06/28/21 0008 06/28/21 1233  WBC 7.5 8.7  HGB 8.7* 13.6  HCT 26.1* 41.0  PLT 64* 329   No results for input(s): NA, K, CL, CO2, BUN, CREATININE, CALCIUM, PROT, BILITOT, ALKPHOS, ALT, AST, GLUCOSE in the last 168 hours.  Invalid input(s): LABALBU    Imaging/Diagnostic Tests: No results found.   [name], DO 07/03/2021, 6:52 AM PGY-3, [place] Intern pager: [phone], text pages welcome

## 2021-07-04 DIAGNOSIS — F0390 Unspecified dementia without behavioral disturbance: Secondary | ICD-10-CM | POA: Diagnosis not present

## 2021-07-04 DIAGNOSIS — F02C18 Dementia in other diseases classified elsewhere, severe, with other behavioral disturbance: Secondary | ICD-10-CM | POA: Diagnosis not present

## 2021-07-04 DIAGNOSIS — G309 Alzheimer's disease, unspecified: Secondary | ICD-10-CM | POA: Diagnosis not present

## 2021-07-04 DIAGNOSIS — R319 Hematuria, unspecified: Secondary | ICD-10-CM | POA: Diagnosis not present

## 2021-07-04 NOTE — Progress Notes (Signed)
Family Medicine Teaching Service Daily Progress Note Intern Pager: [phone]  Patient name: Charles Fox Medical record number: [id number] Date of birth: 1951/03/24 Age: 69 y.o. Gender: male  Primary Care Provider: [name], MD Consultants: None   Code Status: Full Code  [name]:  8/19: Admitted for altered mental status and hematuria 8/20: Normal brain MRI and improved mentation  8/21: MoCA 12/30, determined to not have capacity 8/22: Discussion with son revealed patient does not have a safe place for discharge 8/23: PSA elevated.  Urology recommending keeping catheter and rechecking PSA in 1 month. 8/24: Voiding trial 8/25: Suicide precautions, voiding trial failed, Foley reinserted 8/26: Suicide precautions removed 8/29 agitated and combative, delirium precautions in place 9/5: AKI 9/9: AKI resolved 9/18: Foley removed 9/30: reported SI, placed on suicide precautions with sitter 10/1: suicide precautions removed 10/3: Self reported fall  Assessment and Plan: Charles Fox is a 70 y.o. male admitted for altered mental status and hematuria with concern for bladder malignancy.  Past medical history significant for hypertension, T2DM, HLD, tobacco use disorder, and homelessness.  Patient is medically stable and awaiting SNF placement, guardianship hearing scheduled 07/09/21.  Home insecurity On admission, patient reported he slept in a van. Due to his dementia and inability to care for himself, patient would need someone to care for him after discharge. TOC team is working diligently on finding a safe disposition.  Pt has interim guardianship hearing on 07/09/21.  - appreciate CM and SW help with pt's disposition   Urinary retention Possible bladder malignancy seen on CT. PSA decreased 10-fold in the last month. - continue tamsulosin and finasteride - urology outpatient follow-up - weekly CBC, next on 10/17  Dementia No issues overnight. -  quetiapine 100 mg qhs - melatonin 5 mg qhs - recommend neurology outpatient follow-up for dementia  T2DM Stable. - metformin 500 mg daily  HTN Well-controlled. - continue losartan 50 mg daily - continue carvedilol 3.125 mg BID  Tobacco use - nicotine gum prn  FEN/GI: Heart healthy/carb modified diet PPx: Enoxaparin  Disposition: Awaiting SNF placement, APS guardianship case pending  Subjective:  NAOE.  Reports feeling good, no concerns. Denies any pain.  Objective: Temp:  [97.3 F (36.3 C)-98.7 F (37.1 C)] 98 F (36.7 C) (10/16 0407) Pulse Rate:  [74-96] 85 (10/16 0407) Resp:  [18-20] 18 (10/16 0407) BP: (116-152)/(61-94) 117/72 (10/16 0407) SpO2:  [94 %-100 %] 100 % (10/16 0407) Physical Exam: General: Alert, pleasant, NAD Cardiovascular: RRR, no murmurs Respiratory: Clear to auscultation bilaterally, no respiratory distress Abdomen: Soft, nontender, positive bowel sounds Extremities: Warm and well perfused, no edema  Laboratory: Recent Labs  Lab 06/28/21 0008 06/28/21 1233  WBC 7.5 8.7  HGB 8.7* 13.6  HCT 26.1* 41.0  PLT 64* 329    No results for input(s): NA, K, CL, CO2, BUN, CREATININE, CALCIUM, PROT, BILITOT, ALKPHOS, ALT, AST, GLUCOSE in the last 168 hours.  Invalid input(s): LABALBU    Imaging/Diagnostic Tests: No new imaging.  [name], MD 07/04/2021, 6:24 AM PGY-2, [place] Intern pager: [phone], text pages welcome

## 2021-07-05 DIAGNOSIS — F0390 Unspecified dementia without behavioral disturbance: Secondary | ICD-10-CM | POA: Diagnosis not present

## 2021-07-05 DIAGNOSIS — R319 Hematuria, unspecified: Secondary | ICD-10-CM | POA: Diagnosis not present

## 2021-07-05 DIAGNOSIS — G309 Alzheimer's disease, unspecified: Secondary | ICD-10-CM | POA: Diagnosis not present

## 2021-07-05 DIAGNOSIS — F02C18 Dementia in other diseases classified elsewhere, severe, with other behavioral disturbance: Secondary | ICD-10-CM | POA: Diagnosis not present

## 2021-07-05 LAB — CBC
HCT: 38.5 % — ABNORMAL LOW (ref 39.0–52.0)
Hemoglobin: 12.7 g/dL — ABNORMAL LOW (ref 13.0–17.0)
MCH: 29.1 pg (ref 26.0–34.0)
MCHC: 33 g/dL (ref 30.0–36.0)
MCV: 88.1 fL (ref 80.0–100.0)
Platelets: 296 10*3/uL (ref 150–400)
RBC: 4.37 MIL/uL (ref 4.22–5.81)
RDW: 14 % (ref 11.5–15.5)
WBC: 10.4 10*3/uL (ref 4.0–10.5)
nRBC: 0 % (ref 0.0–0.2)

## 2021-07-05 LAB — BASIC METABOLIC PANEL
Anion gap: 9 (ref 5–15)
BUN: 17 mg/dL (ref 8–23)
CO2: 22 mmol/L (ref 22–32)
Calcium: 9.2 mg/dL (ref 8.9–10.3)
Chloride: 102 mmol/L (ref 98–111)
Creatinine, Ser: 0.94 mg/dL (ref 0.61–1.24)
GFR, Estimated: >60 mL/min (ref >60)
Glucose, Bld: 110 mg/dL — ABNORMAL HIGH (ref 70–99)
Potassium: 4.1 mmol/L (ref 3.5–5.1)
Sodium: 133 mmol/L — ABNORMAL LOW (ref 135–145)

## 2021-07-05 MED ORDER — QUETIAPINE FUMARATE 50 MG PO TABS
50.0000 mg | ORAL_TABLET | ORAL | Status: DC
  Administered 2021-07-05 – 2021-07-12 (×8): 50 mg via ORAL
  Filled 2021-07-05 (×8): qty 1

## 2021-07-05 NOTE — Progress Notes (Signed)
Family Medicine Teaching Service Daily Progress Note Intern Pager: [phone]  Patient name: Charles Fox Medical record number: [id number] Date of birth: 1950-10-15 Age: 70 y.o. Gender: male  Primary Care Provider: [name], MD Consultants: None Code Status: Full code  [name]:  8/19: Admitted for altered mental status and hematuria 8/20: Normal brain MRI and improved mentation  8/21: MoCA 12/30, determined to not have capacity 8/22: Discussion with son revealed patient does not have a safe place for discharge 8/23: PSA elevated.  Urology recommending keeping catheter and rechecking PSA in 1 month. 8/24: Voiding trial 8/25: Suicide precautions, voiding trial failed, Foley reinserted 8/26: Suicide precautions removed 8/29 agitated and combative, delirium precautions in place 9/5: AKI 9/9: AKI resolved 9/18: Foley removed 9/30: reported SI, placed on suicide precautions with sitter 10/1: suicide precautions removed 10/3: Self reported fall  Assessment and Plan: Charles Fox is a 70 y.o. male admitted for altered mental status and hematuria with concern for bladder malignancy.  Past medical history significant for hypertension, T2DM, HLD, tobacco use disorder, and homelessness.  Patient is medically stable and awaiting SNF placement, guardianship hearing scheduled 07/09/21.  Home insecurity On admission, he reported he was living out of his truck. Due to dementia and inability to care properly for self, he will need assistance with this at discharge. TOC team is working diligently to find a safe disposition. He has an interim guardianship hearing on 07/09/21.  - Appreciate SW and CM assistance  Dementia No issues overnight. Pleasantly confused this morning. - Quetiapine 100 mg qhs - Melatonin 5 mg qhs - Outpatient neurology follow up for dementia - Encourage ambulation with RN   Urinary retention He has been voiding normally. He had weekly  CBC today which was largely within normal limits. WBC 10.4, Hgb 12.7 - Continue tamsulosin - Continue finasterida - Urology outpatient follow-up - Weekly CBC, next on 10/14   T2DM Stable.  BMP this AM- largely within normal limits. Glucose 110, Cr 0.94.  - metformin 500 mg daily   HTN Well-controlled. 120s-130s/60s-70s - Continue Losartan 50mg  daily - Continue Carvedilol 3.125mg  BID   Tobacco use - Nicotine gum PRN  FEN/GI: Heart healthy/carb modified diet PPx: SCDs Dispo: Awaiting SNF placement. APS guardianship trial on 10/21.  Subjective:  Charles Fox was resting comfortably in bed comfortably this morning. He woke up to speak with me. He was pleasantly confused and thinks he is at a "[place] place in [employer]" and said he has "a lot to do" and needs to "work on tires."   Objective: Temp:  [98.1 F (36.7 C)-99.3 F (37.4 C)] 98.5 F (36.9 C) (10/17 0400) Pulse Rate:  [81-93] 81 (10/17 0400) Resp:  [17-20] 18 (10/17 0400) BP: (87-139)/(61-77) 110/67 (10/17 0400) SpO2:  [98 %-100 %] 99 % (10/17 0400) Physical Exam: General: Resting comfortably in bed in NAD Cardiovascular: RRR, no murmurs Respiratory: Clear in all lung fields. Normal WOB on room air Abdomen: Soft, non-tender, non-distended Extremities: Warm, dry, no edema  Laboratory: Recent Labs  Lab 06/28/21 1233 07/05/21 0036  WBC 8.7 10.4  HGB 13.6 12.7*  HCT 41.0 38.5*  PLT 329 296   Recent Labs  Lab 07/05/21 0036  NA 133*  K 4.1  CL 102  CO2 22  BUN 17  CREATININE 0.94  CALCIUM 9.2  GLUCOSE 110*     Imaging/Diagnostic Tests: No results found.   Orvis Brill, DO 07/05/2021, 8:53 AM PGY-1, [place]  Intern pager: [phone], text pages welcome

## 2021-07-05 NOTE — Progress Notes (Addendum)
Family Medicine Teaching Service Daily Progress Note Intern Pager: [phone]  Patient name: Charles Fox Medical record number: [id number] Date of birth: [birth year] Age: 70 y.o. Gender: male  Primary Care Provider: [name], MD Consultants: None Code Status: Full  [name]:  8/19: Admitted for altered mental status and hematuria 8/20: Normal brain MRI and improved mentation  8/21: MoCA 12/30, determined to not have capacity 8/22: Discussion with son revealed patient does not have a safe place for discharge 8/23: PSA elevated.  Urology recommending keeping catheter and rechecking PSA in 1 month. 8/24: Voiding trial 8/25: Suicide precautions, voiding trial failed, Foley reinserted 8/26: Suicide precautions removed 8/29 agitated and combative, delirium precautions in place 9/5: AKI 9/9: AKI resolved 9/18: Foley removed 9/30: reported SI, placed on suicide precautions with sitter 10/1: suicide precautions removed 10/3: Self reported fall  Assessment and Plan: Charles Fox is a 70 y.o. male admitted for altered mental status and hematuria with concern for bladder malignancy.  Past medical history significant for hypertension, T2DM, HLD, tobacco use disorder, and homelessness.  Patient is medically stable and awaiting SNF placement, guardianship hearing scheduled 07/09/21.   Home insecurity On admission, he reported he was living out of his truck. Due to dementia and inability to care properly for self, he will need assistance with this at discharge. TOC team is working diligently to find a safe disposition. He has an interim guardianship hearing on 07/09/21.  - Appreciate SW and CM assistance   Dementia No issues overnight- Seroquel decreased to 50mg  nightly. No reported agitation. Today, he is oriented to self, location, year. Will continue to wean Seroquel weekly- wean to 25mg  10/25. - Quetiapine 50 mg qhs - Melatonin 5 mg qhs - Outpatient  neurology follow up for dementia - Encourage ambulation with RN  Other conditions (T2DM, Urinary retention, HTN, tobacco use) chronic and stable. Home medications continued.  FEN/GI: Heart healthy/carb modified diet PPx: SCDs Dispo:Awaiting SNF placement. APS guardianship trial on 10/21.  Subjective:  Charles Fox was complaining of some "difficulty breathing" this morning but when he sat up he felt better. He tells me he does get out of bed to walk around, which was confirmed with RN. He is eating and drinking well. No other complaints.  Objective: Temp:  [97.8 F (36.6 C)-98.7 F (37.1 C)] 97.8 F (36.6 C) (10/17 1631) Pulse Rate:  [81-98] 98 (10/17 1631) Resp:  [17-20] 17 (10/17 1631) BP: (102-126)/(61-76) 102/70 (10/17 1631) SpO2:  [99 %-100 %] 99 % (10/17 0400) Physical Exam: General: Laying flat in bed, pleasant, comfortable Cardiovascular: RRR Respiratory: Clear in all lung fields. Normal WOB on room air Abdomen: Soft, non-tender, non-distended Extremities: Warm, dry, no edema  Laboratory: Recent Labs  Lab 07/05/21 0036  WBC 10.4  HGB 12.7*  HCT 38.5*  PLT 296   Recent Labs  Lab 07/05/21 0036  NA 133*  K 4.1  CL 102  CO2 22  BUN 17  CREATININE 0.94  CALCIUM 9.2  GLUCOSE 110*    Imaging/Diagnostic Tests: No results found.   Charles Brill, DO 07/05/2021, 9:59 PM PGY-1, [place] Intern pager: [phone], text pages welcome

## 2021-07-05 NOTE — Progress Notes (Addendum)
CSW spoke with patient at beside to discuss the upcoming court hearing for interim guardianship. Patient able to state his full name and birth date, he was able to state who the current president is, what season we are in but stated the year was 2082. Patient states he has not seen or heard from his son Charles Fox. CSW explained what the court hearing on Friday will consist of and what the purpose is - patient stated understanding but immediately switched conversation topics to an unrelated matter. Patient denied any current complaints and did not have questions.  CSW will attend interim guardianship hearing on 07/09/21 at 10am.  CSW spoke with Talbert Forest of APS to discuss patient.  [name], MSW, LCSW Transitions of Care  Clinical Social Worker II [phone]

## 2021-07-06 DIAGNOSIS — R319 Hematuria, unspecified: Secondary | ICD-10-CM | POA: Diagnosis not present

## 2021-07-06 DIAGNOSIS — F02C18 Dementia in other diseases classified elsewhere, severe, with other behavioral disturbance: Secondary | ICD-10-CM | POA: Diagnosis not present

## 2021-07-06 DIAGNOSIS — F0390 Unspecified dementia without behavioral disturbance: Secondary | ICD-10-CM | POA: Diagnosis not present

## 2021-07-06 DIAGNOSIS — G309 Alzheimer's disease, unspecified: Secondary | ICD-10-CM | POA: Diagnosis not present

## 2021-07-06 NOTE — Progress Notes (Signed)
FPTS Brief Note Reviewed patient's vitals, recent notes.  Vitals:   07/05/21 2203 07/06/21 0047  BP: (!) 144/79   Pulse: 78   Resp:  14  Temp: 97.8 F (36.6 C) (!) 97.1 F (36.2 C)  SpO2: 99%    Monitor BP and temp throughout the shift.  At this time, no change in plan from day progress note.  [name], DO Page [phone] with questions about this patient.

## 2021-07-06 NOTE — Plan of Care (Signed)
  Problem: Education: Goal: Knowledge of General Education information will improve Description Including pain rating scale, medication(s)/side effects and non-pharmacologic comfort measures Outcome: Progressing   Problem: Health Behavior/Discharge Planning: Goal: Ability to manage health-related needs will improve Outcome: Progressing   

## 2021-07-07 DIAGNOSIS — F02C18 Dementia in other diseases classified elsewhere, severe, with other behavioral disturbance: Secondary | ICD-10-CM | POA: Diagnosis not present

## 2021-07-07 DIAGNOSIS — G309 Alzheimer's disease, unspecified: Secondary | ICD-10-CM | POA: Diagnosis not present

## 2021-07-07 DIAGNOSIS — F0390 Unspecified dementia without behavioral disturbance: Secondary | ICD-10-CM | POA: Diagnosis not present

## 2021-07-07 DIAGNOSIS — R319 Hematuria, unspecified: Secondary | ICD-10-CM | POA: Diagnosis not present

## 2021-07-07 NOTE — Progress Notes (Signed)
FPTS Brief Note Reviewed patient's vitals, recent notes.  Vitals:   07/07/21 0000 07/07/21 0420  BP: 126/69 128/69  Pulse: 90 72  Resp: 18 20  Temp: 98.4 F (36.9 C) 97.8 F (36.6 C)  SpO2:     VSS. At this time, no change in plan from day progress note.  [name], DO Page [phone] with questions about this patient.

## 2021-07-07 NOTE — Progress Notes (Addendum)
11:30am: CSW spoke with Towanda Malkin to discuss patient. CSW provided [place] with contact information for APS worker Talbert Forest.  8:30am: CSW received voicemail from [employer] stating she was assigned the Guardian ad Litem for this patient. CSW attempted to return call to [facility], no answer so a voicemail was left requesting a return call.  [name], MSW, LCSW Transitions of Care  Clinical Social Worker II [phone]

## 2021-07-08 DIAGNOSIS — R4182 Altered mental status, unspecified: Secondary | ICD-10-CM | POA: Diagnosis not present

## 2021-07-08 NOTE — Progress Notes (Signed)
Family Medicine Teaching Service Daily Progress Note Intern Pager: [phone]  Patient name: Charles Fox Medical record number: [id number] Date of birth: 1950-12-31 Age: 70 y.o. Gender: male  Primary Care Provider: [name], MD Consultants: None Code Status: Full  [name]:  8/19: Admitted for altered mental status and hematuria 8/20: Normal brain MRI and improved mentation  8/21: [facility] 12/30, determined to not have capacity 8/22: Discussion with son revealed patient does not have a safe place for discharge 8/23: PSA elevated.  Urology recommending keeping catheter and rechecking PSA in 1 month. 8/24: Voiding trial 8/25: Suicide precautions, voiding trial failed, Foley reinserted 8/26: Suicide precautions removed 8/29 agitated and combative, delirium precautions in place 9/5: AKI 9/9: AKI resolved 9/18: Foley removed 9/30: reported SI, placed on suicide precautions with sitter 10/1: suicide precautions removed 10/3: Self reported fall 10/20: New guardian: Bethany Boring  Assessment and Plan: Charles Fox is a 70 year old male admitted for AMS, worsening dementia. He is medically stable but pending disposition to SNF vs. Long-term facility. Guardianship hearing scheduled for tomorrow, 07/09/21. PMH significant for HTN, T2DM, HLD, tobacco use disorder, homelessness.  Housing insecurity Patient was living out of his truck prior to admission. TOC team working to find safe disposition, which will likely be a SNF or long-term facility. This is all pending 07/09/21 guardianship hearing. - Appreciate SW and CM assistance - Will discuss with SW regarding Medicaid application  Dementia Patient remains pleasantly demented. No acute overnight issues including agitation. Today, he is oreitned to self, location but not year. He says it is 2082.  - Plan to wean Seroquel from 50mg  to 25mg  on 10/25 - Continue Seroquel 50mg  qhs - Continue melatonin 5 mg qhs -  Encourage ambulation with RN  Other conditions (T2DM, HTN, urinary retention, tobacco use) chronic and stable. Home medications continued.   FEN/GI: Heart healthy/ carb modified diet PPx: SCDs Dispo: Awaiting SNF/long-term facility placement   APS guardianship trial on 10/21 . Newly appointed guardian: Dorise Hiss  Subjective:  Charles Fox has no complaints this morning. He was resting comfortably in bed and answered questions appropriately. Alert and oriented to person, place.  Objective: Temp:  [97.7 F (36.5 C)-98.3 F (36.8 C)] 97.7 F (36.5 C) (10/20 0816) Pulse Rate:  [78-95] 95 (10/20 0816) Resp:  [16-20] 20 (10/20 0816) BP: (130-153)/(67-73) 130/67 (10/20 0816) SpO2:  [97 %-100 %] 97 % (10/20 0816) Physical Exam: General: Laying in bed with covers on, pleasant and comfortable Cardiovascular: RRR Respiratory: Normal work of breathing, no respiratory distress Abdomen: Soft, non-tender Extremities: Warm, dry, no edema  Laboratory: Recent Labs  Lab 07/05/21 0036  WBC 10.4  HGB 12.7*  HCT 38.5*  PLT 296   Recent Labs  Lab 07/05/21 0036  NA 133*  K 4.1  CL 102  CO2 22  BUN 17  CREATININE 0.94  CALCIUM 9.2  GLUCOSE 110*    Imaging/Diagnostic Tests: No results found.   Orvis Brill, DO 07/08/2021, 11:12 AM PGY-1, [place] Intern pager: [phone], text pages welcome

## 2021-07-08 NOTE — Progress Notes (Signed)
FPTS Brief Note Reviewed patient's vitals, recent notes.  Vitals:   07/07/21 2102 07/07/21 2356  BP: 131/72 138/73  Pulse: 90 86  Resp: 18 16  Temp: 97.7 F (36.5 C) 97.7 F (36.5 C)  SpO2: 97% 97%   At this time, no change in plan from day progress note.  [name], DO Page [phone] with questions about this patient.

## 2021-07-09 DIAGNOSIS — G309 Alzheimer's disease, unspecified: Secondary | ICD-10-CM

## 2021-07-09 DIAGNOSIS — F02C18 Dementia in other diseases classified elsewhere, severe, with other behavioral disturbance: Secondary | ICD-10-CM

## 2021-07-09 DIAGNOSIS — R4182 Altered mental status, unspecified: Secondary | ICD-10-CM | POA: Diagnosis not present

## 2021-07-09 DIAGNOSIS — F17200 Nicotine dependence, unspecified, uncomplicated: Secondary | ICD-10-CM

## 2021-07-09 NOTE — Progress Notes (Signed)
FPTS Brief Note Reviewed patient's vitals, recent notes.  Vitals:   07/09/21 1537 07/09/21 1943  BP: (!) 145/74 (!) 148/83  Pulse: 84 78  Resp: 16 17  Temp: 97.9 F (36.6 C) (!) 97.5 F (36.4 C)  SpO2: 100% 100%   Await safe disposition.   At this time, no change in plan from day progress note.  [name], DO Page [phone] with questions about this patient.

## 2021-07-09 NOTE — Progress Notes (Signed)
FPTS Brief Note Reviewed patient's vitals, recent notes.  Vitals:   07/09/21 0017 07/09/21 0351  BP: 106/67 136/88  Pulse: 85 83  Resp: 16 18  Temp: 97.7 F (36.5 C) 97.8 F (36.6 C)  SpO2: 98% 99%    VSS  At this time, no change in plan from day progress note.  [name], DO Page [phone] with questions about this patient.

## 2021-07-09 NOTE — Progress Notes (Signed)
I spoke with Charles Fox working on Mr. Zaldivar case. The guardianship hearing today granted DSS ability to sign Mr. Deharo into a care facility, but denied guardian of estate which means we will be unable to proceed with a Medicaid application or transitioning him into a SNF until there is access to Mr. Belzer's finances/assets.  Arbie Cookey has worked diligently to get into contact with Mr. Privitera son Sam to no avail. Sam is also facing housing insecurity, and has access to Mr. Bukhari truck because he has his keys. There is potential that he is misspending finances and/or has Mr. Darco wallet.  We will continue to follow with CSW. Greatly appreciate their work in the care of this patient.  Orvis Brill, [place] intern pager [phone], text pages welcome

## 2021-07-09 NOTE — Progress Notes (Signed)
CSW participated in the interim court hearing for patient. DSS has been appointed as interim guardian of the person only. This order expires 08/23/2021.  [name], MSW, LCSW Transitions of Care  Clinical Social Worker II [phone]

## 2021-07-09 NOTE — Progress Notes (Signed)
Family Medicine Teaching Service Daily Progress Note Intern Pager: [phone]  Patient name: Charles Fox Medical record number: [id number] Date of birth: 01/13/1951 Age: 70 y.o. Gender: male  Primary Care Provider: [name], MD Consultants: None Code Status: Full  [name]:  8/19: Admitted for altered mental status and hematuria 8/20: Normal brain MRI and improved mentation  8/21: [facility] 12/30, determined to not have capacity 8/22: Discussion with son revealed patient does not have a safe place for discharge 8/23: PSA elevated.  Urology recommending keeping catheter and rechecking PSA in 1 month. 8/24: Voiding trial 8/25: Suicide precautions, voiding trial failed, Foley reinserted 8/26: Suicide precautions removed 8/29 agitated and combative, delirium precautions in place 9/5: AKI 9/9: AKI resolved 9/18: Foley removed 9/30: reported SI, placed on suicide precautions with sitter 10/1: suicide precautions removed 10/3: Self reported fall 10/20: New guardian: Charles Fox 10/21: Guardianship hearing  Assessment and Plan: Charles Fox is a 70 year old male admitted for AMS, worsening dementia. He is medically stable for discharge when appropriate and safe place is available. Guardianship hearing is scheduled for today. PMH significant for HTN, T2DM, HLD, tobacco use disorder, housing insecurity.  Dementia Charles Fox remains pleasantly confused and demented. No acute overnight events. He is oriented to self, location.  - Plan to continue to wean Seroquel next week. Continue 50mg  qhs for now - Continue melatonin 5mg  qhs - Encourage ambulation with RN  Housing insecurity Patient was living out of truck prior to admission, he is estranged from family members. TOC team is working to find a safe disposition. Guardianship hearing is today, will have more information afterward. - Appreciate SW and CM assistance - Guardian: [employer] manager - Will  discuss with SW regarding Medicaid application  Other conditions (T2DM, HTN, urinary retention, tobacco use) chronic and stable. Homed medications continued.  FEN/GI: Heart healthy/carb modified PPx: SCDs Dispo:Awaiting safe disposition pending guardianship trial today.  Subjective:  Charles Fox has no complaints including chest pain, SOB, abdominal pain. He is eating and drinking normally. He says he is cold and wants to go home. He was pleasantly confused.  Objective: Temp:  [97.6 F (36.4 C)-98.4 F (36.9 C)] 97.6 F (36.4 C) (10/21 0817) Pulse Rate:  [75-90] 75 (10/21 0817) Resp:  [16-20] 18 (10/21 0817) BP: (106-136)/(55-88) 107/74 (10/21 0817) SpO2:  [95 %-100 %] 100 % (10/21 0817) Physical Exam: General: Resting in bed with covers on, pleasant, comfortable Cardiovascular: RRR Respiratory: Normal work of breathing, no respiratory distress. On room air. Abdomen: Soft, non-tender Extremities: Warm, dry, no edema  Laboratory: Recent Labs  Lab 07/05/21 0036  WBC 10.4  HGB 12.7*  HCT 38.5*  PLT 296   Recent Labs  Lab 07/05/21 0036  NA 133*  K 4.1  CL 102  CO2 22  BUN 17  CREATININE 0.94  CALCIUM 9.2  GLUCOSE 110*    Imaging/Diagnostic Tests: No results found.   Orvis Brill, DO 07/09/2021, 9:18 AM PGY-1, [place] Intern pager: [phone], text pages welcome

## 2021-07-10 NOTE — Progress Notes (Addendum)
Family Medicine Teaching Service Daily Progress Note Intern Pager: [phone]  Patient name: Charles Fox Medical record number: [id number] Date of birth: 1951/07/02 Age: 70 y.o. Gender: male  Primary Care Provider: [name], MD Consultants: None Code Status: Full  [name]:  8/19: Admitted for altered mental status and hematuria 8/20: Normal brain MRI and improved mentation  8/21: MoCA 12/30, determined to not have capacity 8/22: Discussion with son revealed patient does not have a safe place for discharge 8/23: PSA elevated.  Urology recommending keeping catheter and rechecking PSA in 1 month. 8/24: Voiding trial 8/25: Suicide precautions, voiding trial failed, Foley reinserted 8/26: Suicide precautions removed 8/29 agitated and combative, delirium precautions in place 9/5: AKI 9/9: AKI resolved 9/18: Foley removed 9/30: reported SI, placed on suicide precautions with sitter 10/1: suicide precautions removed 10/3: Self reported fall 10/20: New guardian: Charles Fox 10/21: Guardianship hearing  Assessment and Plan: Charles Fox is a 70 year old male with dementia awaiting SNF placement.  He is medically stable for discharge when appropriate and safe disposition is available.  PMH significant for hypertension, type 2 diabetes mellitus, hyperlipidemia, tobacco use disorder, housing insecurity.  Dementia Charles Fox was pleasant this morning, discussing his mother or father and religion extensively.  Overnight, he was somewhat agitated but redirectable by RN and was able to go to sleep.  He was requesting someone to pray with, and I told him that I could have a chaplain come see him, to which he said he would like. -Continue 50 mg nightly Seroquel, will wean to 25 mg nightly on 10/25 -Continue melatonin 5 mg nightly -Ambulate with RN -Consult to spiritual care  Housing insecurity/placement Guardianship hearing yesterday granted DSS guardianship for  Charles Fox.  They would not granted guardian of estate, which will hinder applying for Medicaid/SNF placement.  Social work continues to work diligently to resolve the financial barrier of accessing Charles Fox assets and bank account to hopefully find safe disposition soon.  We will await further information from social work. -Greatly appreciate social work's dedication to this patient -Guardian: Charles Fox until 08/23/2021  Other conditions including T2DM, hypertension, urinary retention, tobacco use chronic and stable.  Home medications continue.  FEN/GI: Heart healthy, carb modified diet PPx: SCDs Dispo:SNF  pending additional guardianship .    Subjective:  Today Charles Fox tells me that he does not "want to be a burden on anyone."  He was asking to get up and walk around.  He was comfortably laying in bed watching the television.  He was saying that he wants to teach people "how to work" and then started talking about religion, to which she said he wanted someone to pray with.  Objective: Temp:  [97.5 F (36.4 C)-98.1 F (36.7 C)] 97.8 F (36.6 C) (10/22 0439) Pulse Rate:  [69-86] 72 (10/22 0439) Resp:  [16-20] 18 (10/22 0439) BP: (107-148)/(67-99) 130/67 (10/22 0439) SpO2:  [98 %-100 %] 98 % (10/22 0439) Physical Exam: General: Resting in the bed under covers, pleasant, watching TV Cardiovascular: RRR Respiratory: Normal work of breathing no respiratory distress.  On room air.  Speaking in full sentences. Abdomen: Soft, nontender Extremities: Warm, dry, no edema  Laboratory: Recent Labs  Lab 07/05/21 0036  WBC 10.4  HGB 12.7*  HCT 38.5*  PLT 296   Recent Labs  Lab 07/05/21 0036  NA 133*  K 4.1  CL 102  CO2 22  BUN 17  CREATININE 0.94  CALCIUM 9.2  GLUCOSE 110*  Charles Brill, DO 07/10/2021, 7:04 AM PGY-1, [place] Intern pager: [phone], text pages welcome

## 2021-07-11 DIAGNOSIS — F02C18 Dementia in other diseases classified elsewhere, severe, with other behavioral disturbance: Secondary | ICD-10-CM | POA: Diagnosis not present

## 2021-07-11 DIAGNOSIS — G309 Alzheimer's disease, unspecified: Secondary | ICD-10-CM | POA: Diagnosis not present

## 2021-07-11 DIAGNOSIS — R4182 Altered mental status, unspecified: Secondary | ICD-10-CM | POA: Diagnosis not present

## 2021-07-11 NOTE — Progress Notes (Signed)
Family Medicine Teaching Service Daily Progress Note Intern Pager: [phone]  Patient name: Charles Fox Medical record number: [id number] Date of birth: 20-Aug-1951 Age: 70 y.o. Gender: male  Primary Care Provider: [name], MD Consultants: None  Code Status: Full  [name]:  8/19: Admitted for altered mental status and hematuria 8/20: Normal brain MRI and improved mentation  8/21: [facility]CA 12/30, determined to not have capacity 8/22: Discussion with son revealed patient does not have a safe place for discharge 8/23: PSA elevated.  Urology recommending keeping catheter and rechecking PSA in 1 month. 8/24: Voiding trial 8/25: Suicide precautions, voiding trial failed, Foley reinserted 8/26: Suicide precautions removed 8/29 agitated and combative, delirium precautions in place 9/5: AKI 9/9: AKI resolved 9/18: Foley removed 9/30: reported SI, placed on suicide precautions with sitter 10/1: suicide precautions removed 10/3: Self reported fall 10/20: New guardian: Dorise Hiss 10/21: Guardianship hearing  Assessment and Plan: Charles Fox is a 70 year old male with dementia awaiting SNF placement.  He is medically stable for discharge when appropriate and safe disposition is available.  PMH significant for hypertension, type 2 diabetes mellitus, hyperlipidemia, tobacco use disorder, housing insecurity.  Dementia  No acute overnight event. Sleeping this morning. Pleasant when awake. States he has body aches but sleeping without issue and exam unremarkable. VSS.  -continue tele sitter -continue delirium precautions -Continue 50 mg nightly Seroquel, will wean to 25 mg nightly on 10/25 -Continue melatonin 5 mg nightly  Housing insecurity/placement DSS granted guardainship 10/21. Guardian: Bethany Boring until 08/23/2021. Does not have guardianship over estate which makes dispo to SNF difficult without being able to apply for medicaid. SW working on  financial aspect for patient to be able to apply for medicaid and SNF placement. -appreciate TOC recommendations  Other conditions including T2DM, hypertension, urinary retention, tobacco use chronic and stable.  Home medications continue.  FEN/GI: Heart healthy, carb modified diet PPx: SCDs Dispo:SNF pending guardianship.   Subjective:  Initially sleeping soundly in bed. Aroused when called by name 3x. States he needs to use restroom. Is using the restroom without issue. When asked about pain said he aches all over.   Objective: Temp:  [97.6 F (36.4 C)-98.2 F (36.8 C)] 97.6 F (36.4 C) (10/23 0419) Pulse Rate:  [71-84] 77 (10/23 0419) Resp:  [14-17] 16 (10/23 0419) BP: (97-143)/(43-96) 131/70 (10/23 0419) SpO2:  [97 %-98 %] 98 % (10/23 0419) Physical Exam: General: Initially sleeping. Easily aroused. No acute distress.  Cardiovascular: RRR. No murmurs.  Respiratory: CTAB. Normal effort.  Abdomen: NABS. Soft, no guarding. No tender to palpation.  Extremities: No LE edema.   Laboratory: Recent Labs  Lab 07/05/21 0036  WBC 10.4  HGB 12.7*  HCT 38.5*  PLT 296   Recent Labs  Lab 07/05/21 0036  NA 133*  K 4.1  CL 102  CO2 22  BUN 17  CREATININE 0.94  CALCIUM 9.2  GLUCOSE 110*    Charles Fox, Charles Top, DO 07/11/2021, 6:09 AM PGY-3, [place] Intern pager: [phone], text pages welcome

## 2021-07-12 DIAGNOSIS — R4182 Altered mental status, unspecified: Secondary | ICD-10-CM | POA: Diagnosis not present

## 2021-07-12 DIAGNOSIS — F02C2 Dementia in other diseases classified elsewhere, severe, with psychotic disturbance: Secondary | ICD-10-CM | POA: Diagnosis not present

## 2021-07-12 DIAGNOSIS — G309 Alzheimer's disease, unspecified: Secondary | ICD-10-CM | POA: Diagnosis not present

## 2021-07-12 NOTE — Progress Notes (Signed)
FMTS Attending Daily Note: Charles Singh, MD  Team Pager [phone] Pager [phone]  I have seen and examined this patient, reviewed their chart. I have discussed this patient with the resident physician. Edits within nota below  I agree with the remainder of the findings, exam, and plan below.   Disposition: Stable for discharge when bed available at SNF.     Family Medicine Teaching Service Daily Progress Note Intern Pager: [phone]  Patient name: Charles Fox Medical record number: [id number] Date of birth: [birth year] Age: 70 y.o. Gender: male  Primary Care Provider: [name], MD Consultants: None Code Status: Full  [name]:  8/19: Admitted for altered mental status and hematuria 8/20: Normal brain MRI and improved mentation  8/21: [facility] 12/30, determined to not have capacity 8/22: Discussion with son revealed patient does not have a safe place for discharge 8/23: PSA elevated.  Urology recommending keeping catheter and rechecking PSA in 1 month. 8/24: Voiding trial 8/25: Suicide precautions, voiding trial failed, Foley reinserted 8/26: Suicide precautions removed 8/29 agitated and combative, delirium precautions in place 9/5: AKI 9/9: AKI resolved 9/18: Foley removed 9/30: reported SI, placed on suicide precautions with sitter 10/1: suicide precautions removed 10/3: Self reported fall 10/20: New guardian: Dorise Hiss 10/21: Guardianship hearing, granted guardianship over person (not estate), thus unable to apply for medicaid.   Assessment and Plan: Charles Fox is a 70 year old male with dementia medically stable for discharge awaiting SNF placement. PMH significant for HTN, T2DM, HLD, tobacco use disorder.  Dementia Charles Fox is well this morning. No acute overnight events. - Continue 50mg  nightly Seroquel, wean to 25mg  on 10/25 - Continue melatonin 5mg  nightly - Encourage ambulation with RN  Housing  insecurity/placement Guardian is Dorise Hiss until 12/5, no control of finances and unable to file for medicaid status, making placement difficult. Awaiting further information from [place]. - Appreciate CSW care for this patient  HCM - Will discuss Flu and COVID vaccine  Other conditions including T2DM, HTN, urinary retention, tobacco use disorder are chronic and stable. Continued home medications.  FEN/GI: Heart healthy/carb modified diet PPx: SCDs Dispo:SNF  pending additional guardianship .   Subjective:  Charles Fox has no concerns this AM. Alert to person, knows he is in hospital. Does not know were truck is located.    Objective: Temp:  [97.1 F (36.2 C)-98.5 F (36.9 C)] 97.1 F (36.2 C) (10/24 0420) Pulse Rate:  [72-99] 78 (10/24 0420) Resp:  [16-20] 20 (10/24 0420) BP: (105-138)/(57-80) 138/76 (10/24 0420) SpO2:  [98 %-99 %] 99 % (10/23 1605) Physical Exam: General: Resting in bed Cardiovascular: RRR Respiratory: CTAB Abdomen: Soft, non-tender Extremities: Warm, dry, no edema   Orvis Brill, DO 07/12/2021, 7:21 AM PGY-1, [place] Intern pager: [phone], text pages welcome

## 2021-07-12 NOTE — Progress Notes (Signed)
FPTS Brief Note Reviewed patient's vitals, recent notes.  Vitals:   07/12/21 1634 07/12/21 2041  BP: (!) 142/66 107/90  Pulse: 70 78  Resp: 17 18  Temp: 98.3 F (36.8 C) 98.4 F (36.9 C)  SpO2: 100%    At this time, no change in plan from day progress note.   Eulis Foster, MD Page [phone] with questions about this patient.

## 2021-07-12 NOTE — Progress Notes (Signed)
   07/12/21 1430  Clinical Encounter Type  Visited With Patient  Visit Type Spiritual support  Consult/Referral To Chaplain  Spiritual Encounters  Spiritual Needs Prayer;Emotional   Pt expressed when asked about focus of prayer said he felt he was going to die.  Pt said a few others in his family had passed away at a hospital.  Chaplain prayed as he requested for God to give him guidance and assure him of God's presence.

## 2021-07-12 NOTE — Progress Notes (Addendum)
FMTS Attending Daily Note: Charles Singh, MD  Team Pager [phone] Pager [phone]  I have seen and examined this patient, reviewed their chart. I have discussed this patient with the resident physician.  Addendums to below note include:  Edits within note.   I agree with the remainder of the findings, exam, and plan below.   Disposition: SNF when able.     Family Medicine Teaching Service Daily Progress Note Intern Pager: [phone]  Patient name: Charles Fox Medical record number: [id number] Date of birth: Sep 25, 1950 Age: 70 y.o. Gender: male  Primary Care Provider: [name], MD Consultants: None Code Status: Full  [name]:  8/19: Admitted for altered mental status and hematuria 8/20: Normal brain MRI and improved mentation  8/21: [facility] 12/30, determined to not have capacity 8/22: Discussion with son revealed patient does not have a safe place for discharge 8/23: PSA elevated.  Urology recommending keeping catheter and rechecking PSA in 1 month. 8/24: Voiding trial 8/25: Suicide precautions, voiding trial failed, Foley reinserted 8/26: Suicide precautions removed 8/29 agitated and combative, delirium precautions in place 9/5: AKI 9/9: AKI resolved 9/18: Foley removed 9/30: reported SI, placed on suicide precautions with sitter 10/1: suicide precautions removed 10/3: Self reported fall 10/20: New guardian: Charles Fox 10/21: Guardianship hearing, granted guardianship over person (not estate) 10/25: Informed by SW that APS worker applying for medicaid   Assessment and Plan: Charles Fox is a 70 year old male with dementia medically stable for discharge awaiting SNF placement. PMH significant for T2DM, HLD, HTN, tobacco use disorder.  Dementia Charles Fox is at his baseline this morning. No acute overnight events. Weekly labs are largely within normal limits. Na 133, K 4.1, Cr 0.94, BUN 17, WBC 10.4, Hgb 12.7. - Wean Seroqul 50mg  to  25mg  nightly starting today 10/25 - Weekly labs on 11/01 - Continue melatonin 5mg  nightly - Encourage ambulation with RN  Housing insecurity/placement Guardian is Charles Fox 12/5, no control of finances and unable to file for medicaid status. Awaiting SNF placement, which will be difficult due to financial asset assessment barrier. - Appreciate CSW care for this patient, will await updates   HCM Charles Fox said he would like the Flu and COVID vaccine. - Will get consent from guardian, Charles Fox  Other conditions including T2DM, HTN, urinary retention, tobacco use disorder are chronic and stable. Continue home medications.  FEN/GI: Heart healthy/carb modified diet PPx: SCDs Dispo:SNF pending additional guardianship.  Subjective:  No pain or complaints this morning. He was eating breakfast.  He says he "needs to see my kidney doctor."   Objective: Temp:  [97.4 F (36.3 C)-98.7 F (37.1 C)] 97.4 F (36.3 C) (10/25 0805) Pulse Rate:  [70-90] 86 (10/25 0805) Resp:  [17-20] 17 (10/25 0805) BP: (107-142)/(65-91) 126/91 (10/25 0805) SpO2:  [95 %-100 %] 100 % (10/25 0805) Physical Exam: General: Sitting at the edge of the bed, in no distress, eating breakfast Cardiovascular: RRR Respiratory: Normal work of breathing, no respiratory distress. Lungs clear to auscultation in all fields Abdomen: Soft, non-tender Extremities: Warm, dry, no edema  Orvis Brill, DO 07/13/2021, 8:40 AM PGY-1, [place] Intern pager: [phone], text pages welcome

## 2021-07-13 DIAGNOSIS — F02C2 Dementia in other diseases classified elsewhere, severe, with psychotic disturbance: Secondary | ICD-10-CM | POA: Diagnosis not present

## 2021-07-13 DIAGNOSIS — G309 Alzheimer's disease, unspecified: Secondary | ICD-10-CM | POA: Diagnosis not present

## 2021-07-13 DIAGNOSIS — R4182 Altered mental status, unspecified: Secondary | ICD-10-CM | POA: Diagnosis not present

## 2021-07-13 MED ORDER — QUETIAPINE FUMARATE 25 MG PO TABS
25.0000 mg | ORAL_TABLET | ORAL | Status: DC
  Administered 2021-07-13 – 2021-07-17 (×5): 25 mg via ORAL
  Filled 2021-07-13 (×5): qty 1

## 2021-07-13 NOTE — Progress Notes (Signed)
CSW spoke with Talbert Forest at [facility] to request she and interim guardian Devoria Albe apply for Medicaid on the patient's behalf.  [name], MSW, LCSW Transitions of Care  Clinical Social Worker II [phone]

## 2021-07-13 NOTE — Progress Notes (Signed)
FPTS Brief Note Reviewed patient's vitals, recent notes.  Vitals:   07/13/21 1654 07/13/21 2018  BP: 130/74 120/85  Pulse: 76 85  Resp: 18 18  Temp: 98.4 F (36.9 C) 98.8 F (37.1 C)  SpO2: 99%    At this time, no change in plan from day progress note.   Eulis Foster, MD Page [phone] with questions about this patient.

## 2021-07-14 DIAGNOSIS — R4182 Altered mental status, unspecified: Secondary | ICD-10-CM | POA: Diagnosis not present

## 2021-07-14 NOTE — Progress Notes (Signed)
Family Medicine Teaching Service Daily Progress Note Intern Pager: [phone]  Patient name: Charles Fox Medical record number: [id number] Date of birth: [birth year] Age: 70 y.o. Gender: male  Primary Care Provider: [name], MD Consultants: None Code Status: Full  [name]:  8/19: Admitted for altered mental status and hematuria 8/20: Normal brain MRI and improved mentation  8/21: [facility] 12/30, determined to not have capacity 8/22: Discussion with son revealed patient does not have a safe place for discharge 8/23: PSA elevated.  Urology recommending keeping catheter and rechecking PSA in 1 month. 8/24: Voiding trial 8/25: Suicide precautions, voiding trial failed, Foley reinserted 8/26: Suicide precautions removed 8/29 agitated and combative, delirium precautions in place 9/5: AKI 9/9: AKI resolved 9/18: Foley removed 9/30: reported SI, placed on suicide precautions with sitter 10/1: suicide precautions removed 10/3: Self reported fall 10/20: New guardian: [name] 10/21: Guardianship hearing, granted guardianship over person (not estate) 10/25: Informed by SW that APS worker applying for medicaid   Assessment and Plan:  Charles Fox is a 70 year old male with dementia who presented with AMS. PMH is significant for T2DM, HTN, Dementia, HLD, and Tobacco use disorder  Dementia.  No acute event overnight. Patient is medically stable and at baseline. Awaiting SNF placement Continue Seroquel 25 mg at bedtime Continue melatonin 5 mg at bed time -Next weekly lab (11/01)  Hematuria Patient is to follow up with urology outpatient  Home Insecurity Patient has a new Guardian Ms. Devoria Albe. SNF placement is pending approval for Medicaid while awaiting access to Mr. Ann's financial statement.  Health Maintenance Patient expressed interest in getting covid booster and flu vaccine. Waiting to get consent from guardian to administer the  vaccines.   FEN/GI: Heart healthy/ carb modified PPx: SCDs Dispo:SNF  awaiting placement .   Subjective:  Patient said he is doing well ans slept good last night. He has no complaints at this time.  Objective: Temp:  [97.7 F (36.5 C)-98.4 F (36.9 C)] 98.4 F (36.9 C) (10/26 1617) Pulse Rate:  [68-90] 90 (10/26 1617) Resp:  [16-20] 18 (10/26 1617) BP: (122-133)/(66-71) 133/71 (10/26 1617) SpO2:  [98 %-100 %] 98 % (10/26 1617) Physical Exam: General: Awake, Pleasant, NAD Cardiovascular: RRR, Normal S1/S2 Respiratory: CTAB, normal WON on RA Abdomen: Soft, no distension, no tenderness Extremities: No BLE edema  Laboratory: No results for input(s): WBC, HGB, HCT, PLT in the last 168 hours. No results for input(s): NA, K, CL, CO2, BUN, CREATININE, CALCIUM, PROT, BILITOT, ALKPHOS, ALT, AST, GLUCOSE in the last 168 hours.  Invalid input(s): LABALBU  Imaging/Diagnostic Tests: No imaging  Alen Bleacher, MD 07/14/2021, 8:23 PM PGY-1, [place] Intern pager: [phone], text pages welcome

## 2021-07-14 NOTE — Progress Notes (Addendum)
FMTS Attending Daily Note: Charles Singh, MD  Team Pager [phone] Pager [phone]  I have seen and examined this patient, reviewed their chart. I have discussed this patient with the resident physician.  Addendums to below note include:  History of stroke- ASA held on admission due to hematuria. Could consider restarting in future.  I agree with the remainder of the findings, exam, and plan below.   History of urinary retention--will need to ensure he has Urology follow up. Last PSA slightly levated.   Consider weaning off Seroquel entirely.   Disposition: Medically stable for discharge when able (needs Medicaid, bed)     Family Medicine Teaching Service Daily Progress Note Intern Pager: [phone]  Patient name: Charles Fox Medical record number: [id number] Date of birth: 1951-06-04 Age: 70 y.o. Gender: male  Primary Care Provider: [name], MD Consultants: None Code Status: Full  [name]:  8/19: Admitted for altered mental status and hematuria 8/20: Normal brain MRI and improved mentation  8/21: [facility] 12/30, determined to not have capacity 8/22: Discussion with son revealed patient does not have a safe place for discharge 8/23: PSA elevated.  Urology recommending keeping catheter and rechecking PSA in 1 month. 8/24: Voiding trial 8/25: Suicide precautions, voiding trial failed, Foley reinserted 8/26: Suicide precautions removed 8/29 agitated and combative, delirium precautions in place 9/5: AKI 9/9: AKI resolved 9/18: Foley removed 9/30: reported SI, placed on suicide precautions with sitter 10/1: suicide precautions removed 10/3: Self reported fall 10/20: New guardian: Charles Fox 10/21: Guardianship hearing, granted guardianship over person (not estate) 10/25: Informed by SW that APS worker applying for medicaid   Assessment and Plan: Charles Fox is a 70 year old male with history of dementia who was admitted for AMS. He is  medically stable and awaiting SNF placement. PMH is significant for T2DM, HTN, HLD, tobacco use disorder  Dementia No acute events overnight. Patient is at baseline and medically stable. Vitals are WNL. -Continue Seroquel 25 mg at bedtime -Continue Melatonin 5 mg at bedtime -Next weekly lab (11/01) -Awaiting SNF placement  Housing Insecurity Patience Guardian is Charles Fox currently working on applying for [employer]. Still awaiting access to financial statement to move forward with medicaid application.   Health Maintenance [name] expressed interest in getting the Flu and Covid Booster Vaccine. Will contact his guardian, Charles Fox for consent to administer the vaccines.   FEN/GI: Heart Healthy/ Carb modified diet PPx: SCDs Dispo:SNF  awaiting placement  . Barriers include medicaid status.   Subjective:  No acute events overnight.  [name] said he is doing well and has no complaints at this time.   Objective: Temp:  [97.4 F (36.3 C)-98.8 F (37.1 C)] 97.7 F (36.5 C) (10/26 0520) Pulse Rate:  [68-86] 68 (10/26 0520) Resp:  [17-20] 18 (10/26 0520) BP: (120-152)/(66-91) 133/70 (10/26 0520) SpO2:  [99 %-100 %] 99 % (10/25 1654) Physical Exam: General: Awake, NAD, at baseline  Cardiovascular: RRR, no murmurs, normal S1/S2 Respiratory: CTA B, nonlabored breathing on room air Abdomen: Soft, no distention, no tenderness Extremities: No BLE edema, +2 pedal and radial pulses  Laboratory: No results for input(s): WBC, HGB, HCT, PLT in the last 168 hours. No results for input(s): NA, K, CL, CO2, BUN, CREATININE, CALCIUM, PROT, BILITOT, ALKPHOS, ALT, AST, GLUCOSE in the last 168 hours.  Invalid input(s): LABALBU   Imaging/Diagnostic Tests: No imaging  Charles Bleacher, MD 07/14/2021, 7:36 AM PGY-1, [place] Intern pager:  [phone], text pages welcome

## 2021-07-15 DIAGNOSIS — G309 Alzheimer's disease, unspecified: Secondary | ICD-10-CM | POA: Diagnosis not present

## 2021-07-15 DIAGNOSIS — F02C2 Dementia in other diseases classified elsewhere, severe, with psychotic disturbance: Secondary | ICD-10-CM

## 2021-07-15 NOTE — Progress Notes (Signed)
FPTS Brief Note Reviewed patient's vitals, recent notes.  Vitals:   07/14/21 1617 07/14/21 2026  BP: 133/71 139/86  Pulse: 90 98  Resp: 18 16  Temp: 98.4 F (36.9 C) 98.1 F (36.7 C)  SpO2: 98% 98%   At this time, no change in plan from day progress note.  [name], DO Page [phone] with questions about this patient.

## 2021-07-16 DIAGNOSIS — Z8673 Personal history of transient ischemic attack (TIA), and cerebral infarction without residual deficits: Secondary | ICD-10-CM

## 2021-07-16 DIAGNOSIS — G309 Alzheimer's disease, unspecified: Secondary | ICD-10-CM | POA: Diagnosis not present

## 2021-07-16 DIAGNOSIS — F02C2 Dementia in other diseases classified elsewhere, severe, with psychotic disturbance: Secondary | ICD-10-CM | POA: Diagnosis not present

## 2021-07-16 NOTE — Progress Notes (Signed)
FPTS Brief Note Reviewed patient's vitals, recent notes.  Vitals:   07/16/21 0817 07/16/21 1605  BP: (!) 151/69 134/70  Pulse: 76 91  Resp: 18 17  Temp: 98.3 F (36.8 C) 98.3 F (36.8 C)  SpO2: 99% 98%    Pt sleeping well on his side with equal chest rise and fall.  At this time, no change in plan from day progress note.  [name], DO Page [phone] with questions about this patient.

## 2021-07-16 NOTE — Progress Notes (Addendum)
Family Medicine Teaching Service Daily Progress Note Intern Pager: [phone]  Patient name: Charles Fox Medical record number: [id number] Date of birth: Oct 05, 1950 Age: 70 y.o. Gender: male  Primary Care Provider: [name], MD Consultants: None Code Status: Full  [name]:  8/19: Admitted for altered mental status and hematuria 8/20: Normal brain MRI and improved mentation  8/21: [facility] 12/30, determined to not have capacity 8/22: Discussion with son revealed patient does not have a safe place for discharge 8/23: PSA elevated.  Urology recommending keeping catheter and rechecking PSA in 1 month. 8/24: Voiding trial 8/25: Suicide precautions, voiding trial failed, Foley reinserted 8/26: Suicide precautions removed 8/29 agitated and combative, delirium precautions in place 9/5: AKI 9/9: AKI resolved 9/18: Foley removed 9/30: reported SI, placed on suicide precautions with sitter 10/1: suicide precautions removed 10/3: Self reported fall 10/20: New guardian: Charles Fox 10/21: Guardianship hearing, granted guardianship over person (not estate) 10/25: Informed by SW that APS worker applying for medicaid   Assessment and Plan: Charles Fox is a 70 year old male with a history of dementia, admitted for AMS who is now medically stable for discharge awaiting SNF placement. PMH signiciant for T2DM, HTN, HLD, tobacco use disorder.  Dementia No overnight events. Charles Fox is pleasantly confused this morning and at his baseline. Vitals WNL. - Discontinue Seroquel today - Continue Melatonin 5mg  at bedtime - Next weekly labs 11/01 - Awaiting SNF placement  Housing Insecurity Guardian is Charles Fox, currently working on applying for [employer]. Awaiting for access to financial statement to move forward with medicaid applicatoin.  Healthcare Maintenance Day team yesterday attempted to contact guardian with no response to obtain consent for Flu/COVID  booster. - Will re-attempt today to contact guardian  Hx CVA ASA held on admission d/t hematuria. - Consider resuming ASA in future  Hx Urinary retention Chronic. On home medications with good UOP. - Ensure urology follow-up prior to d/c   FEN/GI: Heart healthy/carb modified PPx: SCDs Dispo:Awaiting SNF placement pending Medicaid/financial  Subjective:  No pain or complaints this morning. Charles Fox says he is going to rest today.  Objective: Temp:  [97.8 F (36.6 C)-98.3 F (36.8 C)] 98.3 F (36.8 C) (10/28 0817) Pulse Rate:  [76-91] 76 (10/28 0817) Resp:  [17-18] 18 (10/28 0817) BP: (126-151)/(57-69) 151/69 (10/28 0817) SpO2:  [99 %-100 %] 99 % (10/28 0817) Physical Exam: General: Awake, resting comfortably in bed, NAD, pleasantly confused Cardiovascular: RRR Respiratory: CTAB anteriorly, comfortable ORA Abdomen: Soft, non-distended Extremities: Warm, dry, well-perfused   Charles Brill, DO 07/16/2021, 8:40 AM PGY-1, [place] Intern pager: [phone], text pages welcome

## 2021-07-16 NOTE — Progress Notes (Signed)
FPTS Brief Note Reviewed patient's vitals, recent notes.  Vitals:   07/15/21 1246 07/15/21 1927  BP: (!) 126/57 127/62  Pulse: 82 91  Resp: 18 17  Temp: 98.1 F (36.7 C) 97.8 F (36.6 C)  SpO2:  100%   At this time, no change in plan from day progress note.  [name], DO Page [phone] with questions about this patient.

## 2021-07-16 NOTE — Progress Notes (Signed)
Family Medicine Teaching Service Daily Progress Note Intern Pager: [phone]  Patient name: Charles Fox Medical record number: [id number] Date of birth: Dec 12, 1950 Age: 70 y.o. Gender: male  Primary Care Provider: [name], MD Consultants: None Code Status: Full  [name]:  8/19: Admitted for altered mental status and hematuria 8/20: Normal brain MRI and improved mentation  8/21: [facility] 12/30, determined to not have capacity 8/22: Discussion with son revealed patient does not have a safe place for discharge 8/23: PSA elevated.  Urology recommending keeping catheter and rechecking PSA in 1 month. 8/24: Voiding trial 8/25: Suicide precautions, voiding trial failed, Foley reinserted 8/26: Suicide precautions removed 8/29 agitated and combative, delirium precautions in place 9/5: AKI 9/9: AKI resolved 9/18: Foley removed 9/30: reported SI, placed on suicide precautions with sitter 10/1: suicide precautions removed 10/3: Self reported fall 10/20: New guardian: [name] 10/21: Guardianship hearing, granted guardianship over person (not estate) 10/25: Informed by SW that APS worker applying for medicaid   Assessment and Plan: Charles Fox is a 70 y.o. male with history of type 2 diabetes, hypertension, hyperlipidemia and tobacco use disorder and is admitted for altered mental status which is since resolved.   Patient is medically stable for discharge  Dementia Seroquel was discontinued yesterday.  Patient without acute events overnight. Mood stable per RN.  -Melatonin at bedtime  Housing insecurity Patient's court appointed guardian is applying for Medicaid.  We will need access to patient's financial statements in order to move forward with this application. -Appreciate case management and social work help with his disposition  History of stroke Aspirin was held on admission due to hematuria -Resume aspirin when able  History of  urinary retention Stable. -Urology follow-up at discharge  Hypertension, hyperlipidemia, type 2 diabetes Chronic and stable -Labs: July 20, 2021  FEN/GI: Regular diet PPx: SCDs  Disposition: pending financial decision regarding SNF placement/Medicaid   Subjective:  No significant overnight events.   Objective: Temp:  [98.3 F (36.8 C)-98.4 F (36.9 C)] 98.4 F (36.9 C) (10/29 0011) Pulse Rate:  [76-91] 88 (10/29 0011) Resp:  [16-18] 16 (10/29 0011) BP: (115-151)/(44-70) 115/44 (10/29 0011) SpO2:  [98 %-100 %] 100 % (10/29 0011)  Physical Exam: General: elderly male in no acute distress  Cardiovascular: regular rate and rhythm Respiratory: clear to ascultation bilaterally, no increased work of breathing  Abdomen: soft, non-tender, non-distended Extremities: warm, no LE edema   Laboratory: No results for input(s): WBC, HGB, HCT, PLT in the last 168 hours. No results for input(s): NA, K, CL, CO2, BUN, CREATININE, CALCIUM, PROT, BILITOT, ALKPHOS, ALT, AST, GLUCOSE in the last 168 hours.  Invalid input(s): LABALBU    Imaging/Diagnostic Tests: No results found.   [name], DO  07/17/2021, 6:05 AM PGY-3, [place] Intern pager: [phone], text pages welcome

## 2021-07-17 DIAGNOSIS — G309 Alzheimer's disease, unspecified: Secondary | ICD-10-CM | POA: Diagnosis not present

## 2021-07-17 DIAGNOSIS — F02C2 Dementia in other diseases classified elsewhere, severe, with psychotic disturbance: Secondary | ICD-10-CM | POA: Diagnosis not present

## 2021-07-17 LAB — GLUCOSE, CAPILLARY: Glucose-Capillary: 256 mg/dL — ABNORMAL HIGH (ref 70–99)

## 2021-07-17 NOTE — Progress Notes (Addendum)
Family Medicine Teaching Service Daily Progress Note Intern Pager: [phone]  Patient name: Charles Fox Medical record number: [id number] Date of birth: 1951/05/31 Age: 70 y.o. Gender: male  Primary Care Provider: [name], MD Consultants: None Code Status: Full  [name]:  8/19: Admitted for altered mental status and hematuria 8/20: Normal brain MRI and improved mentation  8/21: MoCA 12/30, determined to not have capacity 8/22: Discussion with son revealed patient does not have a safe place for discharge 8/23: PSA elevated.  Urology recommending keeping catheter and rechecking PSA in 1 month. 8/24: Voiding trial 8/25: Suicide precautions, voiding trial failed, Foley reinserted 8/26: Suicide precautions removed 8/29 agitated and combative, delirium precautions in place 9/5: AKI 9/9: AKI resolved 9/18: Foley removed 9/30: reported SI, placed on suicide precautions with sitter 10/1: suicide precautions removed 10/3: Self reported fall 10/20: New guardian: [name] 10/21: Guardianship hearing, granted guardianship over person (not estate) 10/25: Informed by SW that APS worker applying for medicaid   Assessment and Plan: Mehar Kirkwood is a 70 year old male with a history of type 2 diabetes, hypertension, hyperlipidemia, tobacco use disorder admitted for altered male status which is since resolved.  He is currently medically stable for discharge.  Dementia: Continues on Seroquel which has been weaned to 25mg  nightly.  No acute events overnight. -Continue melatonin  Housing insecurity Patient has a court appointed guardian and is applying for Medicaid.  There is delayed due to not having access to patient's financial statements in order to move forward with the application. -Continue to work with case management and social work to help with his disposition  History of stroke Patient was experiencing hematuria earlier during his admission so  aspirin was held -Can resume aspirin when appropriate  Urinary retention: Patient has history of urinary retention and will need urology follow-up at discharge  Hypertension Blood pressures appropriate overnight.  Next labs planned for November 1.  FEN/GI: Regular diet PPx: SCDs Dispo: pending SNF placement which is delayed due to financial issues.  Difficult to place.  Subjective:  No complaints this morning.  Patient resting comfortably in bed.  Specifically denied chest pain or shortness of breath.  Patient speaking in full sentences and states he remembers me from previous encounter.  Objective: Temp:  [97.7 F (36.5 C)-98.4 F (36.9 C)] 98 F (36.7 C) (10/29 1949) Pulse Rate:  [78-88] 80 (10/29 1949) Resp:  [16-18] 18 (10/29 1949) BP: (115-158)/(44-85) 128/75 (10/29 1949) SpO2:  [95 %-100 %] 95 % (10/29 1949) Physical Exam: General: Awake and alert, lying in bed Heart: Regular rate and rhythm with no murmurs appreciated Lungs: CTA bilaterally, no wheezing Abdomen: Bowel sounds present, no abdominal pain Skin: Warm and dry Extremities: No lower extremity edema  Laboratory: No results for input(s): WBC, HGB, HCT, PLT in the last 168 hours. No results for input(s): NA, K, CL, CO2, BUN, CREATININE, CALCIUM, PROT, BILITOT, ALKPHOS, ALT, AST, GLUCOSE in the last 168 hours.  Invalid input(s): LABALBU  Lurline Del, DO 07/17/2021, 9:20 PM PGY-3, [place] Intern pager: [phone], text pages welcome

## 2021-07-18 DIAGNOSIS — F02C2 Dementia in other diseases classified elsewhere, severe, with psychotic disturbance: Secondary | ICD-10-CM | POA: Diagnosis not present

## 2021-07-18 DIAGNOSIS — G309 Alzheimer's disease, unspecified: Secondary | ICD-10-CM | POA: Diagnosis not present

## 2021-07-18 LAB — GLUCOSE, CAPILLARY: Glucose-Capillary: 299 mg/dL — ABNORMAL HIGH (ref 70–99)

## 2021-07-18 MED ORDER — QUETIAPINE FUMARATE 50 MG PO TABS
50.0000 mg | ORAL_TABLET | Freq: Every day | ORAL | Status: DC
  Administered 2021-07-19 – 2021-09-02 (×46): 50 mg via ORAL
  Filled 2021-07-18 (×25): qty 1
  Filled 2021-07-18: qty 2
  Filled 2021-07-18 (×20): qty 1

## 2021-07-18 MED ORDER — QUETIAPINE FUMARATE 25 MG PO TABS
25.0000 mg | ORAL_TABLET | Freq: Once | ORAL | Status: AC
  Administered 2021-07-18: 25 mg via ORAL
  Filled 2021-07-18: qty 1

## 2021-07-18 MED ORDER — LORAZEPAM 0.5 MG PO TABS
0.5000 mg | ORAL_TABLET | Freq: Once | ORAL | Status: DC
  Filled 2021-07-18: qty 1

## 2021-07-18 NOTE — Progress Notes (Signed)
Second attempt made to contact son with no success.

## 2021-07-18 NOTE — Progress Notes (Signed)
Patient is attempting to leave. I prevented patient from leaving via his room door and he went to the window. Patient refusing to take Metformin although CBG is 299. I explained to patient the reason he needs to take his Metformin and he continues to refuse. MD paged and awaiting response.

## 2021-07-18 NOTE — Progress Notes (Signed)
Per the Treatment Team Sticky Note, I called Mr. Arlean Hopping [facility]) and asked if he would visit this patient. He stated "yes". Patient is amenable to the visit.

## 2021-07-18 NOTE — Progress Notes (Addendum)
Family Medicine Teaching Service Daily Progress Note Intern Pager: [phone]  Patient name: Charles Fox Medical record number: [id number] Date of birth: 1950-11-18 Age: 70 y.o. Gender: male  Primary Care Provider: [name], MD Consultants: None Code Status: Full  [name]:  8/19: Admitted for altered mental status and hematuria 8/20: Normal brain MRI and improved mentation  8/21: MoCA 12/30, determined to not have capacity 8/22: Discussion with son revealed patient does not have a safe place for discharge 8/23: PSA elevated.  Urology recommending keeping catheter and rechecking PSA in 1 month. 8/24: Voiding trial 8/25: Suicide precautions, voiding trial failed, Foley reinserted 8/26: Suicide precautions removed 8/29 agitated and combative, delirium precautions in place 9/5: AKI 9/9: AKI resolved 9/18: Foley removed 9/30: reported SI, placed on suicide precautions with sitter 10/1: suicide precautions removed 10/3: Self reported fall 10/20: New guardian: Charles Fox 10/21: Guardianship hearing, granted guardianship over person (not estate) 10/25: Informed by SW that APS worker applying for medicaid   Assessment and Plan:  Charles Fox is a 70 year old male with hx of demetia who was admitted for AMS. He is medically stable and awaiting SNF placement  Dementia  Patient had an episode of agitation yesterday. Seroquel was recently weaned down to 25 mg daily. Will increase his seroquel to 50mg  daily. Patient is medical stable and back at baseline.  -Continue Seroquel at 50 mg at bedtime  -Next weekly lab scheduled for 11/01  Urine retention On admission patient had urinary retention but passed void trail. He also had reported hematuria which will be followed up outpatient with urology  Charles Fox is to discharge patient to SNF. Currently working on obtaining financial statement in order to proceed with his medicaid application.    FEN/GI: Regular diet PPx: SCDs Dispo:SNF  pending medicaid application  .   Subjective:  Charles Fox said he is doing well and have no complaint. Only asked about when he can go home.   Objective: Temp:  [97.6 F (36.4 C)-98.2 F (36.8 C)] 98 F (36.7 C) (10/30 2024) Pulse Rate:  [70-99] 78 (10/30 2024) Resp:  [16-18] 16 (10/30 2024) BP: (109-142)/(54-80) 120/68 (10/30 2024) SpO2:  [96 %-98 %] 98 % (10/30 2024) Physical Exam: General: Awake, quiet, NAD Cardiovascular: RRR, no murmurs, normal S1/S2 Respiratory: CTAB, no wheezing or crackles Abdomen: Soft, no tenderness or distension Extremities: No BLE edema, +2 pedal and radial pulse  Laboratory: No results for input(s): WBC, HGB, HCT, PLT in the last 168 hours. No results for input(s): NA, K, CL, CO2, BUN, CREATININE, CALCIUM, PROT, BILITOT, ALKPHOS, ALT, AST, GLUCOSE in the last 168 hours.  Invalid input(s): LABALBU    Imaging/Diagnostic Tests: No imagine  Charles Bleacher, MD 07/18/2021, 10:16 PM PGY-1, [place] Intern pager: [phone], text pages welcome

## 2021-07-18 NOTE — Progress Notes (Signed)
Attempted to call son as listed on contact information. I got voicemail messages at both numbers. No message left. Will continue to attempt to contact son.

## 2021-07-18 NOTE — Progress Notes (Addendum)
I responded to a page from patient's nurse about patient being agitated and requesting to leave the hospital.  Upon arrival to the bedside patients nurse and 3 security guard were at bedside redirecting patient. Patient expressed desire to be let go, he said he wanted to go home. When asked, "where is home" patient said " I do not know". He appears to be mildly agitated and nurse reported he was more agitated earlier and attempted to leave the room. Patient has a small cut in his left middle finger which nurse report resulted from him pulling the door handle. Although I was able talk and redirect patient, he still voiced displeasure of staying. I placed an order for a one time doze of 0.5 mg of PO ativan .

## 2021-07-18 NOTE — Progress Notes (Signed)
Patient took metformin. Will continue to monitor CBGs.

## 2021-07-18 NOTE — Progress Notes (Signed)
Patient states "I'm ready to leave. I think I'm going to go. I'm going to head towards [place]." I have attempted to redirect the patient without much response. I asked the patient if he knew where he was and he stated "yes - at [facility]." He also states his son cannot tell him what to do and "If he gets too close to me, I will kill him. I'm not afraid to die." Patient is impulsive and has poor safety awareness. Received an order from MD for 25mg  Seroquel which has been given. I will inform MD of patient's recent comments and disposition. Will continue to monitor.

## 2021-07-18 NOTE — Progress Notes (Signed)
Mr. Arlean Hopping ([facility]) at the bedside to visit with patient.

## 2021-07-18 NOTE — Progress Notes (Signed)
Patient injured finger attempting to pull open room door. Finger assessed with no significant injury. Will continue to monitor.

## 2021-07-18 NOTE — Progress Notes (Signed)
I spoke with the MD regarding existing ativan order. The MD asked that patient's scheduled Seroquel be given and that provider on call during night shift be notified prior to giving patient the ativan dose. This information was given to the night shift RN during bedside report.

## 2021-07-18 NOTE — Progress Notes (Signed)
MD at the bedside  

## 2021-07-19 DIAGNOSIS — F02C2 Dementia in other diseases classified elsewhere, severe, with psychotic disturbance: Secondary | ICD-10-CM | POA: Diagnosis not present

## 2021-07-19 DIAGNOSIS — G309 Alzheimer's disease, unspecified: Secondary | ICD-10-CM | POA: Diagnosis not present

## 2021-07-19 NOTE — Progress Notes (Signed)
FPTS Brief Progress Note  S: Per staff, pt agitated early today. He has received his 2nd dose of Seroquel.    O: BP 140/69 (BP Location: Left Arm)   Pulse 70   Temp 98.1 F (36.7 C) (Oral)   Resp 17   Ht 5\' 8"  (1.727 m)   Wt 101.8 kg   SpO2 98%   BMI 34.12 kg/m    GEN: sleeping well  RESP: equal chest rise and fall   A/P: Continue Seroquel.  - Orders reviewed. Labs for AM ordered for 07/20/21, which was adjusted as needed.    [name], DO 07/19/2021, 1:21 AM PGY-3, [place] [facility] Night Resident  Please page [phone] with questions.

## 2021-07-20 DIAGNOSIS — F02C2 Dementia in other diseases classified elsewhere, severe, with psychotic disturbance: Secondary | ICD-10-CM | POA: Diagnosis not present

## 2021-07-20 DIAGNOSIS — G309 Alzheimer's disease, unspecified: Secondary | ICD-10-CM | POA: Diagnosis not present

## 2021-07-20 LAB — CBC
HCT: 33.6 % — ABNORMAL LOW (ref 39.0–52.0)
Hemoglobin: 11 g/dL — ABNORMAL LOW (ref 13.0–17.0)
MCH: 29.3 pg (ref 26.0–34.0)
MCHC: 32.7 g/dL (ref 30.0–36.0)
MCV: 89.4 fL (ref 80.0–100.0)
Platelets: 251 10*3/uL (ref 150–400)
RBC: 3.76 MIL/uL — ABNORMAL LOW (ref 4.22–5.81)
RDW: 14.5 % (ref 11.5–15.5)
WBC: 9.9 10*3/uL (ref 4.0–10.5)
nRBC: 0 % (ref 0.0–0.2)

## 2021-07-20 LAB — COMPREHENSIVE METABOLIC PANEL
ALT: 22 U/L (ref 0–44)
AST: 18 U/L (ref 15–41)
Albumin: 3 g/dL — ABNORMAL LOW (ref 3.5–5.0)
Alkaline Phosphatase: 99 U/L (ref 38–126)
Anion gap: 6 (ref 5–15)
BUN: 11 mg/dL (ref 8–23)
CO2: 25 mmol/L (ref 22–32)
Calcium: 8.7 mg/dL — ABNORMAL LOW (ref 8.9–10.3)
Chloride: 103 mmol/L (ref 98–111)
Creatinine, Ser: 0.76 mg/dL (ref 0.61–1.24)
GFR, Estimated: >60 mL/min (ref >60)
Glucose, Bld: 212 mg/dL — ABNORMAL HIGH (ref 70–99)
Potassium: 3.8 mmol/L (ref 3.5–5.1)
Sodium: 134 mmol/L — ABNORMAL LOW (ref 135–145)
Total Bilirubin: 0.7 mg/dL (ref 0.3–1.2)
Total Protein: 6.1 g/dL — ABNORMAL LOW (ref 6.5–8.1)

## 2021-07-20 NOTE — Progress Notes (Signed)
Family Medicine Teaching Service Daily Progress Note Intern Pager: [phone]  Patient name: Charles Fox Medical record number: [id number] Date of birth: Feb 23, 1951 Age: 70 y.o. Gender: male  Primary Care Provider: [name], MD Consultants: None Code Status: Full  [name]:  8/19: Admitted for altered mental status and hematuria 8/20: Normal brain MRI and improved mentation  8/21: MoCA 12/30, determined to not have capacity 8/22: Discussion with son revealed patient does not have a safe place for discharge 8/23: PSA elevated.  Urology recommending keeping catheter and rechecking PSA in 1 month. 8/24: Voiding trial 8/25: Suicide precautions, voiding trial failed, Foley reinserted 8/26: Suicide precautions removed 8/29 agitated and combative, delirium precautions in place 9/5: AKI 9/9: AKI resolved 9/18: Foley removed 9/30: reported SI, placed on suicide precautions with sitter 10/1: suicide precautions removed 10/3: Self reported fall 10/20: New guardian: Charles Fox 10/21: Guardianship hearing, granted guardianship over person (not estate) 10/25: Informed by SW that APS worker applying for medicaid   Assessment and Plan:  Charles Fox is a 70 year old male with hx of demetia who was admitted for AMS. He is medically stable and awaiting SNF placement  Dementia  Continue Seroquel 50 mg at bedtime   Urinary Retention Weekly lab: Glc 212, otherwise unremarkable, Hgb 11.0 (12.7,13.6) Outpatient urology follow-up scheduled 11/10  [place] Currently working on obtaining financial statement in order to proceed with Medicaid application.  Plan to discharge to SNF  FEN/GI: Reg Diet PPx: SCD's Dispo: SNF pending medicaid application    Subjective:  Patient is alert and oriented to person and place, but unsure why he is in hospital. He speaks of his wife, but does remember that she has passed.   Objective: Temp:  [97.3 F (36.3  C)-98.7 F (37.1 C)] 98.7 F (37.1 C) (10/31 2047) Pulse Rate:  [75-98] 98 (10/31 2047) Resp:  [17-18] 18 (10/31 2047) BP: (119-122)/(67-73) 119/73 (10/31 2047) SpO2:  [98 %-99 %] 98 % (10/31 2047) Physical Exam: General: Well appearing, alert, conversant, african [place] male Cardiovascular: RRR, [place] Respiratory: CTABL Abdomen: Soft, NTTP, non-distended Extremities: Pulses intact in all extremities. Cap refill < 2 sec  Laboratory: Recent Labs  Lab 07/20/21 0130  WBC 9.9  HGB 11.0*  HCT 33.6*  PLT 251   Recent Labs  Lab 07/20/21 0130  NA 134*  K 3.8  CL 103  CO2 25  BUN 11  CREATININE 0.76  CALCIUM 8.7*  PROT 6.1*  BILITOT 0.7  ALKPHOS 99  ALT 22  AST 18  GLUCOSE 212*      Imaging/Diagnostic Tests:   Charles Bouche, MD 07/20/2021, 8:09 AM PGY-1, [place] Intern pager: [phone], text pages welcome

## 2021-07-20 NOTE — Progress Notes (Signed)
FPTS Brief Note Reviewed patient's vitals, recent notes.  Vitals:   07/19/21 1102 07/19/21 2047  BP: 122/67 119/73  Pulse: 75 98  Resp: 17 18  Temp: (!) 97.3 F (36.3 C) 98.7 F (37.1 C)  SpO2: 99% 98%   At this time, no change in plan from day progress note.  Orvis Brill, DO Page [phone] with questions about this patient.

## 2021-07-21 DIAGNOSIS — F02C2 Dementia in other diseases classified elsewhere, severe, with psychotic disturbance: Secondary | ICD-10-CM | POA: Diagnosis not present

## 2021-07-21 DIAGNOSIS — Z59811 Housing instability, housed, with risk of homelessness: Secondary | ICD-10-CM | POA: Diagnosis not present

## 2021-07-21 DIAGNOSIS — G309 Alzheimer's disease, unspecified: Secondary | ICD-10-CM | POA: Diagnosis not present

## 2021-07-21 DIAGNOSIS — R339 Retention of urine, unspecified: Secondary | ICD-10-CM | POA: Diagnosis not present

## 2021-07-21 DIAGNOSIS — F0390 Unspecified dementia without behavioral disturbance: Secondary | ICD-10-CM | POA: Diagnosis not present

## 2021-07-21 MED ORDER — INFLUENZA VAC A&B SA ADJ QUAD 0.5 ML IM PRSY
0.5000 mL | PREFILLED_SYRINGE | INTRAMUSCULAR | Status: AC
  Administered 2021-07-22: 0.5 mL via INTRAMUSCULAR
  Filled 2021-07-21: qty 0.5

## 2021-07-21 NOTE — Progress Notes (Signed)
Family Medicine Teaching Service Daily Progress Note Intern Pager: [phone]  Patient name: Charles Fox Medical record number: [id number] Date of birth: August 02, 1951 Age: 70 y.o. Gender: male  Primary Care Provider: [name], MD Consultants: None Code Status: Full  [name]:  8/19: Admitted for altered mental status and hematuria 8/20: Normal brain MRI and improved mentation  8/21: MoCA 12/30, determined to not have capacity 8/22: Discussion with son revealed patient does not have a safe place for discharge 8/23: PSA elevated.  Urology recommending keeping catheter and rechecking PSA in 1 month. 8/24: Voiding trial 8/25: Suicide precautions, voiding trial failed, Foley reinserted 8/26: Suicide precautions removed 8/29 agitated and combative, delirium precautions in place 9/5: AKI 9/9: AKI resolved 9/18: Foley removed 9/30: reported SI, placed on suicide precautions with sitter 10/1: suicide precautions removed 10/3: Self reported fall 10/20: New guardian: Charles Fox 10/21: Guardianship hearing, granted guardianship over person (not estate) 10/25: Informed by SW that APS worker applying for medicaid   Assessment and Plan:  Charles Fox is a 70 year old male with hx of demetia who was admitted for AMS. He is medically stable and awaiting SNF placement.  Dementia  Continue Seroquel 50 mg at bedtime. No recent complaints of agitation.  Urinary Retention Resolved. Patient no longer retaining. Has Outpatient Urology follow up appointment 11/10.   Housing Insecurity Currently working on obtaining financial statement in order to proceed with Medicaid application. Plan to discharge to SNF  FEN/GI: Reg Diet PPx: SCD's Dispo:SNF  Pending Medicaid application .    Subjective:  Patient doing well today, sitting in bed eating breakfast. He is conversational, and oriented to person and place. Still lacks understanding of why he is in the  hospital.  Objective: Temp:  [97.9 F (36.6 C)-98.2 F (36.8 C)] 98.2 F (36.8 C) (11/01 1939) Pulse Rate:  [86-88] 86 (11/01 1939) Resp:  [16-18] 18 (11/01 1939) BP: (109-130)/(60-72) 130/72 (11/01 1939) SpO2:  [97 %-99 %] 99 % (11/01 1939) Physical Exam: General: Well appearing, obese, polite, african [place] male Cardiovascular: RRR, NMRG Respiratory: CTABL Abdomen: Soft, NTTP, non-distended Extremities: Pulses intact in all extremities, cap refill < 2 sec  Laboratory: Recent Labs  Lab 07/20/21 0130  WBC 9.9  HGB 11.0*  HCT 33.6*  PLT 251   Recent Labs  Lab 07/20/21 0130  NA 134*  K 3.8  CL 103  CO2 25  BUN 11  CREATININE 0.76  CALCIUM 8.7*  PROT 6.1*  BILITOT 0.7  ALKPHOS 99  ALT 22  AST 18  GLUCOSE 212*    Imaging/Diagnostic Tests:   Holley Bouche, MD 07/21/2021, 5:57 AM PGY-1, [place] Intern pager: [phone], text pages welcome

## 2021-07-21 NOTE — Progress Notes (Signed)
Brief Nutrition Note  RD consulted to assess patient's nutrition status.  Spoke with pt at bedside. Pt very confused, but he reports that he has a good appetite and enjoys the foods he has been getting here.  Per Epic, pt eating 50-100%, mostly 100%, of last 8 meals.  He is unsure of any weight changes. RD to order new weight to confirm any changes since last weight taken was over 1 month ago.  Pt with no depletions on exam.  Labs and medications reviewed.  RD to sign off. No nutrition interventions warranted at this time. If additional needs arise, please re-consult RD.  Derrel Nip, RD, LDN (she/her/hers) Registered Dietitian I Pager #: [phone] After-Hours/Weekend Pager # in [place]

## 2021-07-21 NOTE — Progress Notes (Signed)
FPTS Brief Note Reviewed patient's vitals, recent notes.  Vitals:   07/20/21 0921 07/20/21 1939  BP: 109/60 130/72  Pulse: 88 86  Resp: 16 18  Temp: 97.9 F (36.6 C) 98.2 F (36.8 C)  SpO2: 97% 99%   At this time, no change in plan from day progress note.  Orvis Brill, DO Page [phone] with questions about this patient.

## 2021-07-21 NOTE — Progress Notes (Signed)
CSW attempted to reach patient's guardian ad litem Dorise Hiss to request updates - no answer, a voicemail was left requesting a return call.  CSW attempted to reach patient's interim legal guardian Devoria Albe at [facility] - no answer, a voicemail was left requesting a return call.  [name], MSW, LCSW Transitions of Care  Clinical Social Worker II [phone]

## 2021-07-22 DIAGNOSIS — F322 Major depressive disorder, single episode, severe without psychotic features: Secondary | ICD-10-CM | POA: Diagnosis not present

## 2021-07-22 DIAGNOSIS — F02C2 Dementia in other diseases classified elsewhere, severe, with psychotic disturbance: Secondary | ICD-10-CM | POA: Diagnosis not present

## 2021-07-22 DIAGNOSIS — R45851 Suicidal ideations: Secondary | ICD-10-CM | POA: Diagnosis not present

## 2021-07-22 DIAGNOSIS — G309 Alzheimer's disease, unspecified: Secondary | ICD-10-CM | POA: Diagnosis not present

## 2021-07-22 NOTE — NC FL2 (Addendum)
[place] LEVEL OF CARE SCREENING TOOL     IDENTIFICATION  Patient Name: Charles Fox Birthdate: Jan 22, 1951 Sex: male Admission Date (Current Location): 05/07/2021  [facility] and [place] Number:  Herbalist and Address:  [facility], [place] [address] [place], [place] [address]      Provider Number: [id number]  Attending Physician Name and Address:  Kinnie Feil, MD  Relative Name and Phone Number:  Elna Breslow [facility] DSS @ [phone]    Current Level of Care: Hospital Recommended Level of Care: [facility] [place] Prior Approval Number:    Date Approved/Denied:   PASRR Number: [id number]  Discharge Plan: Other (Comment) (Locked unit, memory care)    Current Diagnoses: Patient Active Problem List   Diagnosis Date Noted   Major depressive disorder, single episode, severe ([place]) 07/25/2021   Inadequate social support    AKI (acute kidney injury) ([place])    Bladder outlet obstruction    Hematuria    Suicidal ideation    AMS (altered mental status) 05/09/2021   History of lacunar cerebrovascular accident 06/11/2020   Dementia ([place]) 05/27/2020   Impaired vision 05/27/2020   Chronic pain of both shoulders 09/10/2019   Food insecurity 07/09/2018   Hyperlipidemia associated with type 2 diabetes mellitus ([place]) 12/22/2016   Urine test positive for microalbuminuria 05/27/2016   Type 2 diabetes mellitus with neurological complications ([place]) 67/59/1638   Morbid obesity ([place]) 09/04/2014   Hypertension associated with diabetes ([place]) 09/04/2014   Tobacco use disorder 09/04/2014    Orientation RESPIRATION BLADDER Height & Weight     Self, Place  Normal Continent Weight: 229 lb 4.5 oz (104 kg) Height:  5\' 8"  (172.7 cm)  BEHAVIORAL SYMPTOMS/MOOD NEUROLOGICAL BOWEL NUTRITION STATUS  Other (Comment) (Dementia)   Continent Diet  AMBULATORY STATUS COMMUNICATION OF NEEDS Skin   Supervision  Verbally Normal                       Personal Care Assistance Level of Assistance  Bathing, Feeding, Dressing Bathing Assistance: Limited assistance Feeding assistance: Independent Dressing Assistance: Independent     Functional Limitations Info  Sight, Hearing, Speech Sight Info: Adequate Hearing Info: Adequate Speech Info: Adequate    SPECIAL CARE FACTORS FREQUENCY                       Contractures Contractures Info: Not present    Additional Factors Info  Code Status, Allergies, Psychotropic Code Status Info: Full Allergies Info: None Psychotropic Info: Seroquel         Current Medications (07/28/2021):  This is the current hospital active medication list Current Facility-Administered Medications  Medication Dose Route Frequency Provider Last Rate Last Admin   acetaminophen (TYLENOL) tablet 650 mg  650 mg Oral Q6H PRN Lattie Haw, MD   650 mg at 07/26/21 1057   atorvastatin (LIPITOR) tablet 40 mg  40 mg Oral Daily Simmons-Robinson, Makiera, MD   40 mg at 07/28/21 0912   carvedilol (COREG) tablet 3.125 mg  3.125 mg Oral BID WC Ganta, Anupa, DO   3.125 mg at 07/28/21 0913   [START ON 07/29/2021] citalopram (CELEXA) tablet 10 mg  10 mg Oral Daily Damita Dunnings B, MD       finasteride (PROSCAR) tablet 5 mg  5 mg Oral Daily Gerrit Heck, MD   5 mg at 07/28/21 0912   losartan (COZAAR) tablet 50 mg  50  mg Oral Daily Corky Sox, MD   50 mg at 07/28/21 0913   melatonin tablet 5 mg  5 mg Oral QHS Alen Bleacher, MD   5 mg at 07/27/21 2101   metFORMIN (GLUCOPHAGE-XR) 24 hr tablet 500 mg  500 mg Oral BID WC Ganta, Anupa, DO   500 mg at 07/28/21 3291   nicotine polacrilex (NICORETTE) gum 2 mg  2 mg Oral Q4H PRN Lenoria Chime, MD       olopatadine (PATANOL) 0.1 % ophthalmic solution 1 drop  1 drop Both Eyes BID Holley Bouche, MD   1 drop at 07/28/21 0919   polyethylene glycol (MIRALAX / GLYCOLAX) packet 17 g  17 g Oral Daily [name], DO   17 g at  07/28/21 0913   QUEtiapine (SEROQUEL) tablet 50 mg  50 mg Oral QHS Carollee Leitz, MD   50 mg at 07/27/21 2101   senna (SENOKOT) tablet 8.6 mg  1 tablet Oral BID [name], DO   8.6 mg at 07/28/21 0913   tamsulosin (FLOMAX) capsule 0.8 mg  0.8 mg Oral Daily Gladys Damme, MD   0.8 mg at 07/28/21 9166     Discharge Medications: Please see discharge summary for a list of discharge medications.  Relevant Imaging Results:  Relevant Lab Results:   Additional Information SSN: [id number]. [employer] 12/02/19, 12/23/19  Archie Endo, LCSW

## 2021-07-22 NOTE — Progress Notes (Signed)
Family Medicine Teaching Service Daily Progress Note Intern Pager: [phone]  Patient name: Charles Fox Medical record number: [id number] Date of birth: [birth year] Age: 70 y.o. Gender: male  Primary Care Provider: [name], MD Consultants: None Code Status: Full  [name]:  8/19: Admitted for altered mental status and hematuria 8/20: Normal brain MRI and improved mentation  8/21: MoCA 12/30, determined to not have capacity 8/22: Discussion with son revealed patient does not have a safe place for discharge 8/23: PSA elevated.  Urology recommending keeping catheter and rechecking PSA in 1 month. 8/24: Voiding trial 8/25: Suicide precautions, voiding trial failed, Foley reinserted 8/26: Suicide precautions removed 8/29 agitated and combative, delirium precautions in place 9/5: AKI 9/9: AKI resolved 9/18: Foley removed 9/30: reported SI, placed on suicide precautions with sitter 10/1: suicide precautions removed 10/3: Self reported fall 10/20: New guardian: [name] 10/21: Guardianship hearing, granted guardianship over person (not estate) 10/25: Informed by SW that APS worker applying for medicaid   Assessment and Plan:  Romin Divita is a 70 year old male with hx of demetia who was admitted for AMS. He is medically stable and awaiting SNF placement.   Dementia  Continue Seroquel 50 mg at bedtime. No recent complaints of agitation  Urinary Retention Resolved. Patient no longer retaining. Has Urology appointment 11/10, may need to reschedule  Housing Insecurity Currently social work is attemptin to obtain financial statement in order to proceed with medicaid application. Plan to discharge to SNF  FEN/GI: Reg Diet PPx: SCDs Dispo: Pending medicaid application     Subjective:  Patient is resting comfortably in bed, has no complaints, reports that he had a BM and urinated this am.  Objective: Temp:  [97.7 F (36.5 C)-98.3 F (36.8  C)] 97.7 F (36.5 C) (11/03 0014) Pulse Rate:  [80-97] 83 (11/03 0014) Resp:  [16-18] 18 (11/03 0014) BP: (126-137)/(63-72) 126/63 (11/03 0014) SpO2:  [95 %-99 %] 95 % (11/03 0014) Physical Exam: General: Well appearing, obese, good affect, African American male Cardiovascular: RRR, [place] Respiratory: CTABL, no wheezes or rhonchi Abdomen: Soft, NTTP, non-distended Extremities: Pulses intact in all extremities, cap refill < 2 sec  Laboratory: Recent Labs  Lab 07/20/21 0130  WBC 9.9  HGB 11.0*  HCT 33.6*  PLT 251   Recent Labs  Lab 07/20/21 0130  NA 134*  K 3.8  CL 103  CO2 25  BUN 11  CREATININE 0.76  CALCIUM 8.7*  PROT 6.1*  BILITOT 0.7  ALKPHOS 99  ALT 22  AST 18  GLUCOSE 212*     Imaging/Diagnostic Tests:   Holley Bouche, MD 07/22/2021, 4:57 AM PGY-1, [place] Intern pager: [phone], text pages welcome

## 2021-07-22 NOTE — Progress Notes (Addendum)
2pm: CSW attempted to reach staff at [employer] without success - a voicemail was left requesting a return call.  10:50am: CSW spoke with patient's interim legal guardian Devoria Albe who states SSA has not provided her with an answer on where the patient's monthly check is deposited. Lanae has not spoken with Dorise Hiss, patient's guardian ad litem to determine if she has gained accessed to the patient's financials.  CSW spoke with Mindy at [facility] who is willing to review patient's clinicals. CSW sent clinicals via secure e-mail. The private pay rate for 30 days is $4,480.  [name], MSW, LCSW Transitions of Care  Clinical Social Worker II [phone]

## 2021-07-23 DIAGNOSIS — F02C2 Dementia in other diseases classified elsewhere, severe, with psychotic disturbance: Secondary | ICD-10-CM | POA: Diagnosis not present

## 2021-07-23 DIAGNOSIS — R45851 Suicidal ideations: Secondary | ICD-10-CM | POA: Diagnosis not present

## 2021-07-23 DIAGNOSIS — G309 Alzheimer's disease, unspecified: Secondary | ICD-10-CM | POA: Diagnosis not present

## 2021-07-23 DIAGNOSIS — F322 Major depressive disorder, single episode, severe without psychotic features: Secondary | ICD-10-CM | POA: Diagnosis not present

## 2021-07-23 NOTE — Progress Notes (Signed)
Family Medicine Teaching Service Daily Progress Note Intern Pager: [phone]  Patient name: Charles Fox Medical record number: [id number] Date of birth: 22-Jan-1951 Age: 70 y.o. Gender: male  Primary Care Provider: [name], MD Consultants: None Code Status: Full  [name]:  8/19: Admitted for altered mental status and hematuria 8/20: Normal brain MRI and improved mentation  8/21: MoCA 12/30, determined to not have capacity 8/22: Discussion with son revealed patient does not have a safe place for discharge 8/23: PSA elevated.  Urology recommending keeping catheter and rechecking PSA in 1 month. 8/24: Voiding trial 8/25: Suicide precautions, voiding trial failed, Foley reinserted 8/26: Suicide precautions removed 8/29 agitated and combative, delirium precautions in place 9/5: AKI 9/9: AKI resolved 9/18: Foley removed 9/30: reported SI, placed on suicide precautions with sitter 10/1: suicide precautions removed 10/3: Self reported fall 10/20: New guardian: [name] 10/21: Guardianship hearing, granted guardianship over person (not estate) 10/25: Informed by SW that APS worker applying for medicaid   Assessment and Plan:  Charles Fox is a 70 year old male with hx of demetia who was admitted for AMS. He is medically stable and awaiting SNF placement.   Dementia  -Continue Seroquel 50 mg qhs.   Urinary Retention Resolved, patient no longer retaining. Urology appointment 11/10 to be rescheduled, will follow up  [place] Currently social work is attempting to obtain financial statement in order to proceed with medicaid application. Plan to discharge to SNF  FEN/GI: Reg diet PPx: SCDs Dispo: Pending medicaid application     Subjective:  Patient doing well today, still unsure why he is in hospital and ask about leaving. Has no complaints  Objective: Temp:  [97.5 F (36.4 C)-98.3 F (36.8 C)] 98.3 F (36.8 C) (11/03  2041) Pulse Rate:  [72-91] 88 (11/03 2041) Resp:  [18] 18 (11/03 2041) BP: (131-159)/(66-85) 159/66 (11/03 2041) SpO2:  [94 %-98 %] 98 % (11/03 2041) Physical Exam: General: Well appearing, obese, African American male, good affect Cardiovascular: RRR, [facility] Respiratory: CTABL Abdomen: Soft, NTTP, Non-distended Extremities: Pulses intact in upper extremities, cap refil; < 2 sec  Laboratory: Recent Labs  Lab 07/20/21 0130  WBC 9.9  HGB 11.0*  HCT 33.6*  PLT 251   Recent Labs  Lab 07/20/21 0130  NA 134*  K 3.8  CL 103  CO2 25  BUN 11  CREATININE 0.76  CALCIUM 8.7*  PROT 6.1*  BILITOT 0.7  ALKPHOS 99  ALT 22  AST 18  GLUCOSE 212*     Imaging/Diagnostic Tests:   Holley Bouche, MD 07/23/2021, 6:07 AM PGY-1, [place] Intern pager: [phone], text pages welcome

## 2021-07-23 NOTE — Progress Notes (Signed)
FPTS Brief Note Reviewed patient's vitals, recent notes.  Vitals:   07/22/21 1524 07/22/21 2041  BP: 131/77 (!) 159/66  Pulse: 85 88  Resp: 18 18  Temp: 97.6 F (36.4 C) 98.3 F (36.8 C)  SpO2: 98% 98%   At this time, no change in plan from day progress note.  Orvis Brill, DO Page [phone] with questions about this patient.

## 2021-07-23 NOTE — Progress Notes (Signed)
FPTS Brief Note Reviewed patient's vitals, recent notes.  Vitals:   07/23/21 1228 07/23/21 2210  BP: 132/76 128/71  Pulse:  88  Resp: 18 20  Temp: 97.9 F (36.6 C) 98.2 F (36.8 C)  SpO2:  98%   At this time, no change in plan from day progress note.  Orvis Brill, DO Page [phone] with questions about this patient.

## 2021-07-24 DIAGNOSIS — F322 Major depressive disorder, single episode, severe without psychotic features: Secondary | ICD-10-CM | POA: Diagnosis not present

## 2021-07-24 DIAGNOSIS — G309 Alzheimer's disease, unspecified: Secondary | ICD-10-CM | POA: Diagnosis not present

## 2021-07-24 DIAGNOSIS — F02C2 Dementia in other diseases classified elsewhere, severe, with psychotic disturbance: Secondary | ICD-10-CM | POA: Diagnosis not present

## 2021-07-24 DIAGNOSIS — R45851 Suicidal ideations: Secondary | ICD-10-CM | POA: Diagnosis not present

## 2021-07-24 NOTE — Progress Notes (Signed)
Family Medicine Teaching Service Daily Progress Note Intern Pager: [phone]  Patient name: Charles Fox Medical record number: [id number] Date of birth: [birth year] Age: 70 y.o. Gender: male  Primary Care Provider: [name], MD Consultants: None Code Status: Full  [name]:  8/19: Admitted for altered mental status and hematuria 8/20: Normal brain MRI and improved mentation  8/21: MoCA 12/30, determined to not have capacity 8/22: Discussion with son revealed patient does not have a safe place for discharge 8/23: PSA elevated.  Urology recommending keeping catheter and rechecking PSA in 1 month. 8/24: Voiding trial 8/25: Suicide precautions, voiding trial failed, Foley reinserted 8/26: Suicide precautions removed 8/29 agitated and combative, delirium precautions in place 9/5: AKI 9/9: AKI resolved 9/18: Foley removed 9/30: reported SI, placed on suicide precautions with sitter 10/1: suicide precautions removed 10/3: Self reported fall 10/20: New guardian: Charles Fox 10/21: Guardianship hearing, granted guardianship over person (not estate) 10/25: Informed by SW that APS worker applying for medicaid   Assessment and Plan: Charles Fox is a 70 year old male with a hx of dementia who was admitted for AMS, now medically stable awaiting SNF placement.  Dementia No overnight issues. No agitation. - Continue seroquel 50mg  qhs  Housing Insecurity/ Placement issues CSW working diligently to access financial statements to proceed with medicaid application. [name] has both a guardian ad litem and interim legal guardian. Plan to d/c to SNF. - CSW following, greatly appreciate their care for this patient  Urinary Retention No recent issues with retention. Urology appointment 11/10 to be rescheduled.  FEN/GI: Regular PPx: SCDs Dispo:Pending medicaid application, SNF placement.  Subjective:  Pt doing well this morning. No complaints. When I  told him the time of day, he said that he was ready for breakfast.  Objective: Temp:  [97.6 F (36.4 C)-98.2 F (36.8 C)] 98.2 F (36.8 C) (11/04 2210) Pulse Rate:  [84-88] 88 (11/04 2210) Resp:  [18-20] 20 (11/04 2210) BP: (128-155)/(61-76) 128/71 (11/04 2210) SpO2:  [96 %-98 %] 98 % (11/04 2210) Weight:  [105.9 kg] 105.9 kg (11/05 0500) Physical Exam: General: Laying comfortably in bed, no distress Cardiovascular: RRR Respiratory: CTAB in all fields Abdomen: Soft, non-distended Extremities: Well-perfused  Laboratory: Recent Labs  Lab 07/20/21 0130  WBC 9.9  HGB 11.0*  HCT 33.6*  PLT 251   Recent Labs  Lab 07/20/21 0130  NA 134*  K 3.8  CL 103  CO2 25  BUN 11  CREATININE 0.76  CALCIUM 8.7*  PROT 6.1*  BILITOT 0.7  ALKPHOS 99  ALT 22  AST 18  GLUCOSE 212*    Gracie Gupta, [name], DO 07/24/2021, 7:30 AM PGY-1, [place] Intern pager: [phone], text pages welcome

## 2021-07-24 NOTE — Progress Notes (Addendum)
Family Medicine Teaching Service Daily Progress Note Intern Pager: [phone]  Patient name: Charles Fox Medical record number: [id number] Date of birth: May 20, 1951 Age: 70 y.o. Gender: male  Primary Care Provider: [name], MD Consultants: None Code Status: Full  [name]:  8/19: Admitted for altered mental status and hematuria 8/20: Normal brain MRI and improved mentation  8/21: MoCA 12/30, determined to not have capacity 8/22: Discussion with son revealed patient does not have a safe place for discharge 8/23: PSA elevated.  Urology recommending keeping catheter and rechecking PSA in 1 month. 8/24: Voiding trial 8/25: Suicide precautions, voiding trial failed, Foley reinserted 8/26: Suicide precautions removed 8/29 agitated and combative, delirium precautions in place 9/5: AKI 9/9: AKI resolved 9/18: Foley removed 9/30: reported SI, placed on suicide precautions with sitter 10/1: suicide precautions removed 10/3: Self reported fall 10/20: New guardian: [name] 10/21: Guardianship hearing, granted guardianship over person (not estate) 10/25: Informed by SW that APS worker applying for medicaid  11/6: Endorsing active SI, precautions w/ 1 on 1 sitter  Assessment and Plan: Charles Fox is a 70 year old male with a hx of dementia who was admitted for AMS, now medically stable awaiting placement.  Suicidal ideation Paged by nursing staff and informed patient is endorsing active SI. Stated to nursing staff he would do it when they were not looking. Also expressed stepping out into traffic or shooting himself with a gun. Went to bedside and patient asleep with 1 Biochemist, clinical. This morning patient is still sleep. No other acute events overnight per sitter or nurse.  -continue 1on1 sitter -continue suicide precautions -consider psych admit; continues to be medically stable for discharge  Dementia - Continue seroquel 50mg  qhs   Housing  Insecurity/ Placement issues CSW working to access financial statements to proceed with medicaid application. Plan to d/c to SNF pending now psych stability. - CSW following, greatly appreciate their care for this patient   Hx of Urinary Retention No recent issues with retention. Urology appointment 11/10 to be rescheduled.   FEN/GI: Regular PPx: SCDs Dispo:Pending medicaid application, SNF placement and mental stability   Subjective:  Sleeping in bed.   Objective: Temp:  [97.7 F (36.5 C)-98.2 F (36.8 C)] 98 F (36.7 C) (11/05 1933) Pulse Rate:  [83-88] 88 (11/05 1933) Resp:  [16-19] 16 (11/05 1933) BP: (121-160)/(63-79) 160/79 (11/05 1933) SpO2:  [96 %-100 %] 96 % (11/05 1933) Weight:  [105.9 kg] 105.9 kg (11/05 0500)  Physical Exam: General: Sleeping peacefully. Lying supine. No distress. Sitter at bedside.  Cardiovascular: RRR. No murmurs Respiratory: CTAB. Normal effort Extremities: No LE edema  Laboratory: Recent Labs  Lab 07/20/21 0130  WBC 9.9  HGB 11.0*  HCT 33.6*  PLT 251   Recent Labs  Lab 07/20/21 0130  NA 134*  K 3.8  CL 103  CO2 25  BUN 11  CREATININE 0.76  CALCIUM 8.7*  PROT 6.1*  BILITOT 0.7  ALKPHOS 99  ALT 22  AST 18  GLUCOSE 212*     Autry-Lott, Naelle Diegel, DO 07/24/2021, 5:54 AM PGY-3, [place] Intern pager: [phone], text pages welcome

## 2021-07-24 NOTE — Progress Notes (Signed)
Received page from RN that patient stated to her that wants to harm himself and that he "will do it when no one is watching." He currently has a Corporate investment banker, will add 1-1 sitter order and round on patient.  Gladys Damme, MD [place] [facility], PGY-3

## 2021-07-24 NOTE — Progress Notes (Signed)
Patient states he is having active thoughts of harming self. Patient does currently have a Electronics engineer and also has dementia. Writer did talk to patient as well as Agricultural consultant to try to comfort patient. Family medicine on call resident made aware of patients statements. Patient does state he plans to harm self with no one is watching. Will make room safe and continue to monitor at this time. Resident states they will come see patient.

## 2021-07-25 DIAGNOSIS — F332 Major depressive disorder, recurrent severe without psychotic features: Secondary | ICD-10-CM

## 2021-07-25 DIAGNOSIS — G309 Alzheimer's disease, unspecified: Secondary | ICD-10-CM | POA: Diagnosis not present

## 2021-07-25 DIAGNOSIS — R45851 Suicidal ideations: Secondary | ICD-10-CM | POA: Diagnosis not present

## 2021-07-25 DIAGNOSIS — F322 Major depressive disorder, single episode, severe without psychotic features: Secondary | ICD-10-CM

## 2021-07-25 DIAGNOSIS — F02C2 Dementia in other diseases classified elsewhere, severe, with psychotic disturbance: Secondary | ICD-10-CM | POA: Diagnosis not present

## 2021-07-25 MED ORDER — FLUOXETINE HCL 20 MG PO CAPS
20.0000 mg | ORAL_CAPSULE | Freq: Every day | ORAL | Status: DC
  Administered 2021-07-25 – 2021-07-26 (×2): 20 mg via ORAL
  Filled 2021-07-25 (×2): qty 1

## 2021-07-25 NOTE — Consult Note (Signed)
[facility] Face-to-Face Psychiatry Consult   Reason for Consult: ''Suicidal ideation'' Referring Physician:  Andrena Mews, MD Patient Identification: Charles Fox MRN:  [id number] Principal Diagnosis: Major depressive disorder, single episode, severe ([name]) Diagnosis:  Principal Problem:   Major depressive disorder, single episode, severe ([name]) Active Problems:   Type 2 diabetes mellitus with neurological complications ([place])   Morbid obesity ([place])   Hypertension associated with diabetes ([place])   Hyperlipidemia associated with type 2 diabetes mellitus ([place])   Dementia ([place])   AMS (altered mental status)   Suicidal ideation   Hematuria   AKI (acute kidney injury) ([place])   Bladder outlet obstruction   Inadequate social support   Total Time spent with patient: 45 minutes  Subjective:  "I have been thinking about suicide because I miss my family and I am tired of my life.'''  HPI:  70 y.o. male patient with history of Dementia, DM, CVA, HTN, HLD, admitted with altered mental status but reportedly medically stable and awaiting SNF placement. Psychiatric consult has been called after patient informed the nursing staff that he is suicidal and will commit suicidal when no one is looking. Today, patient reports that he misses his father, mother and his wife all deceased. States that he has been abandoned by his family and  as a result being experiencing worsening depression characterized by hopelessness, lack of motivation, low energy level, recurrent suicidal thoughts and feeling worthless. Patient is homeless, says he is now convinced that he can no longer takes care of himself and will abide by any help he can get at the hospital including placement in SNF. He is alert, awake, cooperative, oriented to self and place but not to time and situation. 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  With patient's consent, tried calling patient's son [name] @  [phone] answer  Past Psychiatric History: patient denies prior psych problem.  Risk to Self: Endorses SI with plan to hurt himself when no one is looking. Unable to contract for safety at this time (SI is related to housing). 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 No pertinent family history. Family Psychiatric  History: none Social History:  Social History   Substance and Sexual Activity  Alcohol Use No     Social History   Substance and Sexual Activity  Drug Use Not Currently    Social History   Socioeconomic History   Marital status: Widowed    Spouse name: Not on file   Number of children: 1   Years of education: 12   Highest education level: High school graduate  Occupational History   Not on file  Tobacco Use   Smoking status: Every Day    Packs/day:  0.50    Types: Cigarettes   Smokeless tobacco: Never  Vaping Use   Vaping Use: Never used  Substance and Sexual Activity   Alcohol use: No   Drug use: Not Currently   Sexual activity: Not Currently  Other Topics Concern   Not on file  Social History Narrative   Food insecurities - referred to [employer] for 7 diabetic, low sodium meals per week, for a total of 12 weeks, beginning on Friday, July 31st.  Patient was also provided [employer] [facility].       Patient continues to states has no family, friends or support system.  States he has 3 children but does not know how to contact them.  Was able to provide the following information today   Son- Charles Fox lives in [place] lives in [place] lives in [place]   Does not associate with neighbors   Goes to [employer] on [address]. Each Wed.  But states has no friends there   Social Determinants of Radio broadcast assistant Strain: Not on file  Food Insecurity: Not on file  Transportation Needs: Not on file  Physical Activity: Not on file  Stress: Not on file  Social Connections: Not on file   Additional Social History:    Allergies:  No Known Allergies  Labs:  No results found for this or any previous visit (from the past 48 hour(s)).   Current Facility-Administered Medications  Medication Dose Route Frequency Provider Last Rate Last Admin   acetaminophen (TYLENOL) tablet 650 mg  650 mg Oral Q6H PRN Lattie Haw, MD   650 mg at 07/25/21 0313   atorvastatin (LIPITOR) tablet 40 mg  40 mg Oral Daily Simmons-Robinson, Makiera, MD   40 mg at 07/25/21 0948   carvedilol (COREG) tablet 3.125 mg  3.125 mg Oral BID WC Ganta, Anupa, DO   3.125 mg at 07/25/21 0948   finasteride (PROSCAR) tablet 5 mg  5 mg Oral Daily Gerrit Heck, MD   5 mg at 07/25/21 0947   losartan (COZAAR) tablet 50 mg  50 mg Oral Daily Corky Sox, MD   50 mg at 07/25/21 0947   melatonin tablet 5 mg  5 mg Oral QHS Alen Bleacher, MD   5 mg at 07/24/21 2122   metFORMIN (GLUCOPHAGE-XR) 24 hr tablet 500 mg  500 mg Oral BID WC Ganta, Anupa, DO   500 mg at 07/25/21 0948   nicotine polacrilex (NICORETTE) gum 2 mg  2 mg Oral Q4H PRN Lenoria Chime, MD       olopatadine (PATANOL) 0.1 % ophthalmic solution 1 drop  1 drop Both Eyes BID Holley Bouche, MD   1 drop at 07/25/21 0951   polyethylene glycol (MIRALAX / GLYCOLAX) packet 17 g  17 g Oral Daily [name], DO   17 g at  07/24/21 5852   QUEtiapine (SEROQUEL) tablet 50 mg  50 mg Oral QHS Carollee Leitz, MD   50 mg at 07/24/21 2122   senna (SENOKOT) tablet 8.6 mg  1 tablet Oral BID [name], DO   8.6 mg at 07/25/21 0948   tamsulosin (FLOMAX) capsule 0.8 mg  0.8 mg Oral Daily Gladys Damme, MD   0.8 mg at 07/25/21 7782    Musculoskeletal: Strength & Muscle Tone: within normal limits Gait & Station:  Deferred Patient leans: N/A     Psychiatric Specialty Exam:  Presentation  General  Appearance: Appropriate for Environment  Eye Contact:Fair  Speech:Normal Rate  Speech Volume:Normal  Handedness:Right   Mood and Affect  Mood:Dysphoric  Affect:Constricted   Thought Process  Thought Processes:Coherent; Linear  Descriptions of Associations:Intact  Orientation:Full (Time, Place and Person)  Thought Content:Logical  History of Schizophrenia/Schizoaffective disorder:No data recorded Duration of Psychotic Symptoms:No data recorded Hallucinations:No data recorded  Ideas of Reference:None  Suicidal Thoughts:Suicidal Thoughts: Yes, Active SI Active Intent and/or Plan: With Intent and Plan SI is related to Housing needs. Pt unable to contracts for safety at this time.    Homicidal Thoughts:denies by patient   Sensorium  Memory:Immediate Good; Remote Fair; Recent [place]  Insight:Shallow   Executive Functions  Concentration:Good  Attention Span:Good  [place]   Psychomotor Activity  Psychomotor Activity:No data recorded   Assets  Assets:Communication Skills; Desire for Improvement   Sleep  Sleep:No data recorded   Physical Exam: Physical Exam Vitals and nursing note reviewed.  Constitutional:      General: He is not in acute distress.    Appearance: Normal appearance. He is not ill-appearing, toxic-appearing or diaphoretic.  HENT:     Head: Normocephalic and atraumatic.  Pulmonary:     Effort:  Pulmonary effort is normal.  Neurological:     General: No focal deficit present.     Mental Status: He is alert and oriented to person, place, and time.  Psychiatric:        Behavior: Behavior normal.   Review of Systems  Constitutional:  Negative for chills and fever.  Respiratory:  Negative for cough and shortness of breath.   Cardiovascular:  Negative for chest pain.  Gastrointestinal:  Negative for heartburn and nausea.  Neurological:  Negative for dizziness and headaches.  Psychiatric/Behavioral:  Negative for hallucinations, memory loss and substance abuse. The patient is not nervous/anxious and does not have insomnia.        Suicidal thoughts with plan(Related to housing).      Blood pressure 125/75, pulse 89, temperature 98.3 F (36.8 C), temperature source Oral, resp. rate 17, height 5\' 8"  (1.727 m), weight 101.7 kg, SpO2 99 %. Body mass index is 34.09 kg/m.  Treatment Plan Summary: 70 year old male who denies prior history of mental illness but now reporting worsening depression, suicidal ideation with plan relating to being homeless. Patient is unable to contract for safety at this time even though he states that he may no longer be suicidal once CSW find him housing.  Patient still scored poorly on MOCA test scoring 8/30.   Recommendations: -Continue 1:1 sitter for safety-even though patient's suicidal thought is related to homelessness but he is unable to contract for safety today. -Patient will benefit from starting Prozac 20 mg daily for depression. -CSW to help primary team with disposition to appropriate place according to Pt's needs. -Due to patient's medical needs and H/o Dementia, SNF or a memory care unit would be more appropriate for safe disposition.  Disposition: Patient does not meet criteria for psychiatric inpatient admission. Supportive therapy provided about ongoing stressors. Psychiatric service will follow as needed  Corena Pilgrim, MD 07/25/2021  12:12 PM

## 2021-07-25 NOTE — Progress Notes (Addendum)
Family Medicine Teaching Service Daily Progress Note Intern Pager: [phone]  Patient name: Charles Fox Medical record number: [id number] Date of birth: [birth year] Age: 70 y.o. Gender: male  Primary Care Provider: [name], MD Consultants: Psychiatry, Urology Code Status: Full  [name]:  8/19: Admitted for altered mental status and hematuria 8/20: Normal brain MRI and improved mentation  8/21: MoCA 12/30, determined to not have capacity 8/22: Discussion with son revealed patient does not have a safe place for discharge 8/23: PSA elevated.  Urology recommending keeping catheter and rechecking PSA in 1 month. 8/24: Voiding trial 8/25: Suicide precautions, voiding trial failed, Foley reinserted 8/26: Suicide precautions removed 8/29 agitated and combative, delirium precautions in place 9/5: AKI 9/9: AKI resolved 9/18: Foley removed 9/30: reported SI, placed on suicide precautions with sitter 10/1: suicide precautions removed 10/3: Self reported fall 10/20: New guardian: [name] 10/21: Guardianship hearing, granted guardianship over person (not estate) 10/25: Informed by SW that APS worker applying for medicaid   Assessment and Plan: Charles Fox is a 70 year old male with a history of dementia, hematuria now resolved who was admitted for AMS, now medically stable awaiting SNF placement.  Suicidal ideation Patient endorsed suicidal ideation on 11/5 stating that he wants to harm himself and that he will "do it when no one is watching".  Psychiatry was consulted and evaluated the patient yesterday and discovered that he was actively suicidal with intent and plan regarding his housing insecurity, and were unable to contact his family. Prozac 20 mg started daily for depression, and a memory care unit or SNF for safe disposition.  This morning,no SI/HI however waxes and wanes -1: 1 sitter continue for today reassess tomorrow -Continue suicide  precautions -Prozac 20 mg daily  Dementia Overnight no acute events. He scored an 8/30 on MoCA assessment with psychiatry. -Continue Seroquel 50 mg nightly  Housing insecurity/placement issues CSW continues to work diligently on [employer] application, accessing patient's financial statements.  [name] continues to have both a guardian ad litem and interim legal guardian. -We will continue to follow with CSW's progress, greatly appreciate their care for this patient  Hx of Urinary Retention -finasteride, tamsulosin -reschedule urology appointment  FEN/GI: Regular PPx: SCDs Dispo:SNF pending medicaid application and mental stability.  Subjective:  No complaints this AM, no SI/HI  Objective: Temp:  [97.6 F (36.4 C)-98.6 F (37 C)] 98.6 F (37 C) (11/06 1948) Pulse Rate:  [87-95] 95 (11/06 1948) Resp:  [16-18] 18 (11/06 1948) BP: (125-144)/(69-77) 125/77 (11/06 1948) SpO2:  [99 %] 99 % (11/06 1948) Weight:  [101.7 kg] 101.7 kg (11/06 0455) Physical Exam: General: NAD, well appearing Cardiovascular: RRR no m/r/g Respiratory: CTAB no m/r/g Abdomen: Nondistended nontender to palpation Extremities: no swelling, moves freely  Orvis Brill, DO 07/25/2021, 10:25 PM PGY-1, [place] Intern pager: [phone], text pages welcome

## 2021-07-26 DIAGNOSIS — G309 Alzheimer's disease, unspecified: Secondary | ICD-10-CM | POA: Diagnosis not present

## 2021-07-26 DIAGNOSIS — R45851 Suicidal ideations: Secondary | ICD-10-CM | POA: Diagnosis not present

## 2021-07-26 DIAGNOSIS — F322 Major depressive disorder, single episode, severe without psychotic features: Secondary | ICD-10-CM | POA: Diagnosis not present

## 2021-07-26 DIAGNOSIS — F02C2 Dementia in other diseases classified elsewhere, severe, with psychotic disturbance: Secondary | ICD-10-CM | POA: Diagnosis not present

## 2021-07-26 NOTE — Progress Notes (Signed)
FPTS Brief Note Reviewed patient's vitals, recent notes.  Vitals:   07/25/21 1731 07/25/21 1948  BP: (!) 144/69 125/77  Pulse: 87 95  Resp: 16 18  Temp: 97.6 F (36.4 C) 98.6 F (37 C)  SpO2:  99%   At this time, no change in plan from day progress note.  Ezequiel Essex, MD Page [phone] with questions about this patient.

## 2021-07-26 NOTE — Consult Note (Signed)
[facility] Face-to-Face Psychiatry Consult   Reason for Consult:   ''Suicidal ideation'' Referring Physician:  Andrena Mews, MD Patient Identification: Charles Fox MRN:  [id number] Principal Diagnosis: Major depressive disorder, single episode, severe ([place]) Diagnosis:  Principal Problem:   Major depressive disorder, single episode, severe ([place]) Active Problems:   Type 2 diabetes mellitus with neurological complications ([place])   Morbid obesity ([place])   Hypertension associated with diabetes ([place])   Hyperlipidemia associated with type 2 diabetes mellitus ([place])   Dementia ([place])   AMS (altered mental status)   Suicidal ideation   Hematuria   AKI (acute kidney injury) ([place])   Bladder outlet obstruction   Inadequate social support   Total Time spent with patient: 15 minutes  Subjective:   Charles Fox is a 70 y.o. male patient admitted with with history of Dementia, DM, CVA, HTN, HLD, admitted with altered mental status but reportedly medically stable and awaiting SNF placement. [name]  HPI:  On assessment today patient is up on the edge of his bed. Patient was oriented to person, place, and year. Patient reports that he and his sitter have been listening to "soul music." Patient reports that he is no longer have SI. Patient endorses that he does recall saying that he had thoughts of self-harm over the weekend, but no longer has them. Patient reports that he does not know what has led to the change in these thoughts. Patient reports that if he were to have these thoughts he would let someone know. Patient reports that he is "sad" today and begins talking about how he is missing clothes, his wallet, his truck, and other personal belongings. Patient reports that he likes to "get up and go", and would like to have shoes. Patient and provider discuss that patient is encouraged to walk with his sitter, which patient said he would considers. Patient reports that he slept well and is eating well. Patient  reports he is bathing daily and is having BMs.    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 No pertinent family history.  Social History:  Social History   Substance and Sexual Activity  Alcohol Use No     Social History   Substance and Sexual Activity  Drug Use Not Currently    Social History   Socioeconomic History   Marital status: Widowed    Spouse name: Not on file   Number of children: 1   Years of education: 9   Highest education level: High school graduate  Occupational History   Not on file  Tobacco Use   Smoking status: Every Day    Packs/day: 0.50    Types: Cigarettes   Smokeless tobacco: Never  Vaping Use   Vaping Use: Never used  Substance and Sexual Activity   Alcohol use: No   Drug use: Not Currently  Sexual activity: Not Currently  Other Topics Concern   Not on file  Social History Narrative   Food insecurities - referred to [place] for 7 diabetic, low sodium meals per week, for a total of 12 weeks, beginning on Friday, July 31st.  Patient was also provided [facility].      Patient continues to states has no family, friends or support system.  States he has 3 children but does not know how to contact them.  Was able to provide the following information today   Son- Sharyn Blitz lives in [place] lives in [place] lives in [place]    Does not associate with neighbors   Goes to [employer] on [address]. Each Wed.  But states has no friends there   Social Determinants of Radio broadcast assistant Strain: Not on file  Food Insecurity: Not on file  Transportation Needs: Not on file  Physical Activity: Not on file  Stress: Not on file  Social Connections: Not on file   Additional Social History:    Allergies:  No Known Allergies  Labs: No results found for this or any previous visit (from the past 48 hour(s)).  Current Facility-Administered Medications  Medication Dose Route Frequency Provider Last Rate Last Admin   acetaminophen (TYLENOL) tablet 650 mg  650 mg Oral Q6H PRN Lattie Haw, MD   650 mg at 07/26/21 1057   atorvastatin (LIPITOR) tablet 40 mg  40 mg Oral Daily Simmons-Robinson, Makiera, MD   40 mg at 07/26/21 0908   carvedilol (COREG) tablet 3.125 mg  3.125 mg Oral BID WC Ganta, Anupa, DO   3.125 mg at 07/26/21 0909   finasteride (PROSCAR) tablet 5 mg  5 mg Oral Daily Gerrit Heck, MD   5 mg at 07/26/21 0909   FLUoxetine (PROZAC) capsule 20 mg  20 mg Oral Daily Akintayo, Mojeed, MD   20 mg at 07/26/21 0908   losartan (COZAAR) tablet 50 mg  50 mg Oral Daily Corky Sox, MD   50 mg at 07/26/21 0909   melatonin tablet 5 mg  5 mg Oral QHS Alen Bleacher, MD   5 mg at 07/25/21 2144   metFORMIN (GLUCOPHAGE-XR) 24 hr tablet 500 mg  500 mg Oral BID WC Ganta, Anupa, DO   500 mg at 07/26/21 9833   nicotine polacrilex (NICORETTE) gum 2 mg  2 mg Oral Q4H PRN Lenoria Chime, MD       olopatadine (PATANOL) 0.1 % ophthalmic solution 1 drop  1 drop Both Eyes BID Holley Bouche, MD   1 drop at 07/26/21 0912   polyethylene glycol (MIRALAX / GLYCOLAX) packet 17 g  17 g Oral Daily [name], DO   17 g at 07/26/21 0908   QUEtiapine (SEROQUEL) tablet 50 mg  50 mg Oral QHS Carollee Leitz, MD   50 mg at 07/25/21 2144   senna (SENOKOT) tablet 8.6 mg  1 tablet Oral BID [name], DO   8.6 mg at 07/26/21 8250    tamsulosin (FLOMAX) capsule 0.8 mg  0.8 mg Oral Daily Gladys Damme, MD   0.8 mg at 07/26/21 0909     Psychiatric Specialty Exam:  Presentation  General Appearance: Appropriate for Environment  Eye Contact:Good  Speech:Clear and Coherent  Speech Volume:Decreased  Handedness:Right   Mood and Affect  Mood:Dysphoric  Affect:Flat   Thought Process  Thought Processes:Goal Directed (concrete)  Descriptions of  Associations:Circumstantial  Orientation:Full (Time, Place and Person)  Thought Content:Tangential  History of Schizophrenia/Schizoaffective disorder:No data recorded Duration of Psychotic Symptoms:No data recorded Hallucinations:Hallucinations: None  Ideas of Reference:None  Suicidal Thoughts:Suicidal Thoughts: No  Homicidal Thoughts:Homicidal Thoughts: No   Sensorium  Memory:Immediate Fair; Recent Poor; Remote Poor  Judgment:Impaired  Insight:None   Executive Functions  Concentration:Poor  Attention Span:Fair  [place]   Psychomotor Activity  Psychomotor Activity:Psychomotor Activity: Normal   Assets  Assets:Resilience   Sleep  Sleep:Sleep: Good   Physical Exam: Physical Exam Constitutional:      Appearance: Normal appearance.  HENT:     Head: Normocephalic and atraumatic.  Eyes:     Extraocular Movements: Extraocular movements intact.  Pulmonary:     Effort: Pulmonary effort is normal.  Skin:    General: Skin is dry.  Neurological:     Mental Status: He is alert and oriented to person, place, and time.   Review of Systems  Psychiatric/Behavioral:  Positive for depression. Negative for suicidal ideas. The patient does not have insomnia.   Blood pressure 125/77, pulse 95, temperature 98.6 F (37 C), temperature source Oral, resp. rate 18, height 5\' 8"  (1.727 m), weight 102 kg, SpO2 99 %. Body mass index is 34.89 kg/m. 70 year old male who denies prior history of mental illness.  Patient appears improved in regards to SI, but continues to endorse depressed mood. This Is not surprising as patient does feel abandoned and endorses feeling that things are missing from his life. Would prefer to transition patient to celexa as this has shown better efficacy in patient's with dementia; however this medication is more likely to prolonged Qtc amongst SSRIs. Prozac also has longer half life and may become super therapeutic in and elderly person with increased chance for decline in kidney function of AKI. Would like to get more recent EKG.   Recommendations: -Continue 1:1 sitter for safety-even though patient's suicidal thought is related to homelessness but he is unable to contract for safety today. -Continue Prozac 20mg , would like to start Celexa tom pending EKG -CSW to help primary team with disposition to appropriate place according to Pt's needs. -Due to patient's medical needs and H/o Dementia, SNF or a memory care unit would be more appropriate for safe disposition.   Disposition: Per primary  Thank you for this consult we will continue to follow.   PGY-2 Freida Busman, MD 07/26/2021 12:05 PM

## 2021-07-27 DIAGNOSIS — F02C2 Dementia in other diseases classified elsewhere, severe, with psychotic disturbance: Secondary | ICD-10-CM | POA: Diagnosis not present

## 2021-07-27 DIAGNOSIS — F322 Major depressive disorder, single episode, severe without psychotic features: Secondary | ICD-10-CM | POA: Diagnosis not present

## 2021-07-27 DIAGNOSIS — G309 Alzheimer's disease, unspecified: Secondary | ICD-10-CM | POA: Diagnosis not present

## 2021-07-27 MED ORDER — CITALOPRAM HYDROBROMIDE 10 MG PO TABS
5.0000 mg | ORAL_TABLET | Freq: Every day | ORAL | Status: DC
Start: 2021-07-27 — End: 2021-07-28
  Administered 2021-07-27 – 2021-07-28 (×2): 5 mg via ORAL
  Filled 2021-07-27 (×2): qty 1

## 2021-07-27 NOTE — Progress Notes (Addendum)
Family Medicine Teaching Service Daily Progress Note Intern Pager: [phone]  Patient name: Charles Fox Medical record number: [id number] Date of birth: 1951/02/22 Age: 70 y.o. Gender: male  Primary Care Provider: [name], MD Consultants: Psychiatry, Urology Code Status: Full  [name]:  8/19: Admitted for altered mental status and hematuria 8/20: Normal brain MRI and improved mentation  8/21: MoCA 12/30, determined to not have capacity 8/22: Discussion with son revealed patient does not have a safe place for discharge 8/23: PSA elevated.  Urology recommending keeping catheter and rechecking PSA in 1 month. 8/24: Voiding trial 8/25: Suicide precautions, voiding trial failed, Foley reinserted 8/26: Suicide precautions removed 8/29 agitated and combative, delirium precautions in place 9/5: AKI 9/9: AKI resolved 9/18: Foley removed 9/30: reported SI, placed on suicide precautions with sitter 10/1: suicide precautions removed 10/3: Self reported fall 10/20: New guardian: [name] 10/21: Guardianship hearing, granted guardianship over person (not estate) 10/25: Informed by SW that APS worker applying for medicaid  11/6: Suicide precautions, MoCA 8/30  Assessment and Plan: Charles Fox is a 70 year old with a history of dementia, hematuria (now resolved) who was admitted for AMS, now medically stable awaiting SNF placement.  Suicidal ideation Patient had endorsed suicidal ideation on 11/5 stating he was going ot harm himself when "no one is watching."  Per psychiatry, fluoxetine d/c'ed. EKG showed NSR, no QT prolongation (413 ms) and no  and will likely replace fluoxetine with citalopram which has slightly more evidence in elderly patients. No SI this morning. - 1:1 sitter for now, de-escalate later this week - Continue suicide precautions - Start Celexa 5mg  daily - AM CMP, CBC  Dementia No acute events overnight including agitation,  wandering, etc.. Last scored 8/30 on MoCA. - Continue Seroquel 50mg  nightly  Housing insecurity/Placement issues CSW working on [employer] application.  He has both a guardian ad litem and interim legal guardian - Will continue tom onitor   Hx Urinary Retention No urinary symptoms. Voiding appropriately. - Continue Finasteride - Continue Tamsulosin - Reschedule urology appointment (11/10)   FEN/GI: Regular PPx: SCDs Dispo:SNF pending medicaid appl   Subjective:  No concerns or complaints this morning. No SI.  Objective: Temp:  [98.3 F (36.8 C)-98.7 F (37.1 C)] 98.3 F (36.8 C) (11/07 1915) Pulse Rate:  [94-98] 94 (11/07 1915) Resp:  [18] 18 (11/07 1915) BP: (122-138)/(72-77) 138/72 (11/07 1915) SpO2:  [98 %-100 %] 100 % (11/07 1915) Weight:  [102.7 kg] 102.7 kg (11/08 0450) Physical Exam: General: Laying in bed with eyes closed in no distress Cardiovascular: RRR Respiratory: Clear anteriorly Abdomen: Soft, non-tender, non-distneded Extremities: Warm, dry, well-perfused. No edema.  Orvis Brill, DO 07/27/2021, 6:56 AM PGY-1, [place] Intern pager: [phone], text pages welcome

## 2021-07-27 NOTE — Progress Notes (Signed)
FPTS Brief Note Reviewed patient's vitals, recent notes.  Vitals:   07/26/21 1500 07/26/21 1915  BP: 122/77 138/72  Pulse: 98 94  Resp: 18 18  Temp: 98.7 F (37.1 C) 98.3 F (36.8 C)  SpO2: 98% 100%   At this time, no change in plan from day progress note.  Holley Bouche, MD Page [phone] with questions about this patient.

## 2021-07-27 NOTE — Progress Notes (Addendum)
CSW was notified of a visitor present on the unit requesting the patient's SSN. Per RN, patient's cousin Charles Fox was present on the unit requesting his SSN to cancel the direct deposits to his bank.  CSW spoke with patient at bedside - a Air cabin crew was present. Patient states he is doing well but is ready to leave the hospital - CSW explained to patient why he cannot leave. Patient requesting assistance in getting to the court house to press charges on his step son Charles Fox. Patient able to tell CSW that he banks with Charles Fox.   CSW met with unit director and patient's cousin Charles Fox [phone]) to have discussion. Charles Fox states he grew up with the patient and also that they attend the same church. CSW informed Charles Fox of guardianship information and how he can contact Charles Fox at [employer].   CSW spoke with patient's legal guardian Charles Fox to provide her with an update.  [name], MSW, LCSW Transitions of Care  Clinical Social Worker II [phone]

## 2021-07-27 NOTE — Progress Notes (Addendum)
Family Medicine Teaching Service Daily Progress Note Intern Pager: [phone]  Patient name: Charles Fox Medical record number: [id number] Date of birth: 1951-05-08 Age: 70 y.o. Gender: male  Primary Care Provider: [name], MD Consultants: Psychiatry, Urology Code Status: Full  [name]:  8/19: Admitted for altered mental status and hematuria 8/20: Normal brain MRI and improved mentation  8/21: MoCA 12/30, determined to not have capacity 8/22: Discussion with son revealed patient does not have a safe place for discharge 8/23: PSA elevated.  Urology recommending keeping catheter and rechecking PSA in 1 month. 8/24: Voiding trial 8/25: Suicide precautions, voiding trial failed, Foley reinserted 8/26: Suicide precautions removed 8/29 agitated and combative, delirium precautions in place 9/5: AKI 9/9: AKI resolved 9/18: Foley removed 9/30: reported SI, placed on suicide precautions with sitter 10/1: suicide precautions removed 10/3: Self reported fall 10/20: New guardian: [name] 10/21: Guardianship hearing, granted guardianship over person (not estate) 10/25: Informed by SW that APS worker applying for medicaid  11/6: Suicide precautions, MoCA 8/30  Assessment and Plan: Charles Fox is a 70 year-old male with a history of dementia, hematuria (now resolved) who was admitted for AMS, now medically stable awaiting SNF placement.  Suicidal ideation No SI this morning. Mood is elevated. Sitter present in room this morning. -Continue 1:1 sitter, de-escalate later this week -Continue suicide precautions -Continue Celexa 5 mg daily, titrate up as tolerated -Psychiatry following, greatly appreciate their care and recommendations  Dementia No agitation issues overnight.  Obtained weekly labs today with CBC and CMP revealing no significant abnormalities and largely unchanged from labs 8 days ago. Hgb 10.8, WBC 10.1, K+ 3.9, Na+ 136, Cr 0.78. Ca  8.6 -Continue Seroquel 50 mg nightly  Housing insecurity/placement issues CSW working on [employer] application.  He has both a guardian ad litem and interim legal guardian.  Per CSW note on 11/8, there was a visitor requesting the patient's SSN in regards to direct deposits in the bank.  Visitor was identified as his cousin [name], who was redirected to discuss any issues with Mr. Kathrin Greathouse guardian.  This is concerning especially in light of difficulty accessing financial statements for the patient, and his estranged relationships with his family. Mr. Mastrangelo was able to tell CSW where he banks which was relayed to his guardian. -Greatly appreciate CSW on their work to secure placement for this patient.  We will continue to follow their progress  History of urinary retention No urinary symptoms. Voiding appropriately. -Continue finasteride -Continue tamsulosin -Reschedule urology appointment (11/10)  FEN/GI: Regular PPx: SCDs Dispo: SNF pending Medicaid application  Subjective:  Mr. Court has no complaints this morning. He makes jokes and was sitting at the edge of his bed drinking coffee.  Objective: Temp:  [98.2 F (36.8 C)-98.4 F (36.9 C)] 98.4 F (36.9 C) (11/09 0713) Pulse Rate:  [82-83] 82 (11/09 0713) Resp:  [19-20] 19 (11/09 0713) BP: (126-141)/(61-69) 141/69 (11/09 0713) SpO2:  [99 %] 99 % (11/09 0713) Weight:  [104 kg] 104 kg (11/09 0713) Physical Exam: General: Elderly gentleman in no distress Cardiovascular: RRR Respiratory: Clear anteriorly Abdomen: Soft, non-tender, non-distendede Extremities: Warm, dry, well-perfused  Laboratory: Recent Labs  Lab 07/28/21 0143  WBC 10.1  HGB 10.8*  HCT 32.3*  PLT 245   Recent Labs  Lab 07/28/21 0143  NA 136  K 3.9  CL 104  CO2 25  BUN 11  CREATININE 0.78  CALCIUM 8.6*  PROT 6.4*  BILITOT 0.7  ALKPHOS  102  ALT 26  AST 20  GLUCOSE 155*     Charles Fox, [name], DO 07/28/2021, 7:31 AM PGY-1, [place] Intern pager: [phone], text pages welcome

## 2021-07-27 NOTE — Plan of Care (Signed)
Patient EKG resulted with a QTC 424.  Decreasing concern the patient will have QT prolongation if Prozac was discontinued and Celexa was started for treatment of depressive symptoms.  Plan MDD, single episode, severe - Discontinue Prozac 20 mg - Start Celexa 5 mg with intention to titrate up as tolerated  We plan to follow up.  Damita Dunnings, MD PGY-2

## 2021-07-28 DIAGNOSIS — F02C2 Dementia in other diseases classified elsewhere, severe, with psychotic disturbance: Secondary | ICD-10-CM | POA: Diagnosis not present

## 2021-07-28 DIAGNOSIS — Z59811 Housing instability, housed, with risk of homelessness: Secondary | ICD-10-CM | POA: Diagnosis not present

## 2021-07-28 DIAGNOSIS — F322 Major depressive disorder, single episode, severe without psychotic features: Secondary | ICD-10-CM | POA: Diagnosis not present

## 2021-07-28 DIAGNOSIS — F0390 Unspecified dementia without behavioral disturbance: Secondary | ICD-10-CM | POA: Diagnosis not present

## 2021-07-28 DIAGNOSIS — R45851 Suicidal ideations: Secondary | ICD-10-CM | POA: Diagnosis not present

## 2021-07-28 DIAGNOSIS — G309 Alzheimer's disease, unspecified: Secondary | ICD-10-CM | POA: Diagnosis not present

## 2021-07-28 DIAGNOSIS — Z87448 Personal history of other diseases of urinary system: Secondary | ICD-10-CM | POA: Diagnosis not present

## 2021-07-28 LAB — CBC WITH DIFFERENTIAL/PLATELET
Abs Immature Granulocytes: 0.03 10*3/uL (ref 0.00–0.07)
Basophils Absolute: 0.1 10*3/uL (ref 0.0–0.1)
Basophils Relative: 1 %
Eosinophils Absolute: 0.5 10*3/uL (ref 0.0–0.5)
Eosinophils Relative: 5 %
HCT: 32.3 % — ABNORMAL LOW (ref 39.0–52.0)
Hemoglobin: 10.8 g/dL — ABNORMAL LOW (ref 13.0–17.0)
Immature Granulocytes: 0 %
Lymphocytes Relative: 29 %
Lymphs Abs: 2.9 10*3/uL (ref 0.7–4.0)
MCH: 29.8 pg (ref 26.0–34.0)
MCHC: 33.4 g/dL (ref 30.0–36.0)
MCV: 89.2 fL (ref 80.0–100.0)
Monocytes Absolute: 0.9 10*3/uL (ref 0.1–1.0)
Monocytes Relative: 9 %
Neutro Abs: 5.7 10*3/uL (ref 1.7–7.7)
Neutrophils Relative %: 56 %
Platelets: 245 10*3/uL (ref 150–400)
RBC: 3.62 MIL/uL — ABNORMAL LOW (ref 4.22–5.81)
RDW: 14.6 % (ref 11.5–15.5)
WBC: 10.1 10*3/uL (ref 4.0–10.5)
nRBC: 0 % (ref 0.0–0.2)

## 2021-07-28 LAB — COMPREHENSIVE METABOLIC PANEL
ALT: 26 U/L (ref 0–44)
AST: 20 U/L (ref 15–41)
Albumin: 3 g/dL — ABNORMAL LOW (ref 3.5–5.0)
Alkaline Phosphatase: 102 U/L (ref 38–126)
Anion gap: 7 (ref 5–15)
BUN: 11 mg/dL (ref 8–23)
CO2: 25 mmol/L (ref 22–32)
Calcium: 8.6 mg/dL — ABNORMAL LOW (ref 8.9–10.3)
Chloride: 104 mmol/L (ref 98–111)
Creatinine, Ser: 0.78 mg/dL (ref 0.61–1.24)
GFR, Estimated: >60 mL/min (ref >60)
Glucose, Bld: 155 mg/dL — ABNORMAL HIGH (ref 70–99)
Potassium: 3.9 mmol/L (ref 3.5–5.1)
Sodium: 136 mmol/L (ref 135–145)
Total Bilirubin: 0.7 mg/dL (ref 0.3–1.2)
Total Protein: 6.4 g/dL — ABNORMAL LOW (ref 6.5–8.1)

## 2021-07-28 MED ORDER — CITALOPRAM HYDROBROMIDE 20 MG PO TABS
10.0000 mg | ORAL_TABLET | Freq: Every day | ORAL | Status: DC
  Administered 2021-07-29 – 2021-08-17 (×20): 10 mg via ORAL
  Filled 2021-07-28 (×20): qty 1

## 2021-07-28 NOTE — Consult Note (Signed)
[facility] Face-to-Face Psychiatry Consult   Reason for Consult:  ''Suicidal ideation'' Referring Physician:  Andrena Mews, MD Patient Identification: MURREL BERTRAM MRN:  [id number] Principal Diagnosis: Major depressive disorder, single episode, severe ([place]) Diagnosis:  Principal Problem:   Major depressive disorder, single episode, severe ([place]) Active Problems:   Type 2 diabetes mellitus with neurological complications ([place])   Morbid obesity ([place])   Hypertension associated with diabetes ([place])   Hyperlipidemia associated with type 2 diabetes mellitus ([place])   Dementia ([place])   AMS (altered mental status)   Suicidal ideation   Hematuria   AKI (acute kidney injury) ([place])   Bladder outlet obstruction   Inadequate social support   Total Time spent with patient: 15 minutes  Subjective:   Charles Fox is a 70 y.o. male patient admitted with history of Dementia, DM, CVA, HTN, HLD, admitted with altered mental status but reportedly medically stable and awaiting SNF placement.  HPI: On assessment today patient appears overall pleasant.  Patient reports that his mood today is "good."  Patient reports that he slept well last night and he is eating well.  Patient denies SI, HI and AVH.  Patient reports that he is not sure about his discharge, but he is currently comfortable.  Patient reports that if he were to have SI he would let staff know.  Patient reports that he feels he could be safe without a sitter.   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 No pertinent family history.  Social History:  Social History   Substance and Sexual Activity  Alcohol Use No     Social History   Substance and Sexual Activity  Drug Use Not Currently    Social History   Socioeconomic History   Marital status: Widowed    Spouse name: Not on file   Number of children: 1   Years of education: 4   Highest education level: High school graduate  Occupational History   Not on file  Tobacco Use   Smoking status: Every Day    Packs/day: 0.50    Types: Cigarettes   Smokeless tobacco: Never  Vaping Use   Vaping Use: Never used  Substance and Sexual Activity   Alcohol use: No   Drug use: Not Currently   Sexual activity: Not Currently  Other Topics Concern   Not on file  Social History Narrative   Food insecurities - referred to [employer] for 7 diabetic, low sodium meals per week, for a total of 12 weeks, beginning on Friday, July 31st.  Patient was also provided [employer] [facility].      Patient continues to states has no family, friends or support system.  States he has 3 children but does not know how to contact them.  Was able to provide the following information  today   Son- Sharyn Blitz lives in [place] lives in [place] lives in [place]   Does not associate with neighbors   Goes to [employer] on [address]. Each Wed.  But states has no friends there   Social Determinants of Radio broadcast assistant Strain: Not on file  Food Insecurity: Not on file  Transportation Needs: Not on file  Physical Activity: Not on file  Stress: Not on file  Social Connections: Not on file   Additional Social History:    Allergies:  No Known Allergies  Labs:  Results for orders placed or performed during the hospital encounter of 05/07/21 (from the past 48 hour(s))  CBC with  Differential/Platelet     Status: Abnormal   Collection Time: 07/28/21  1:43 AM  Result Value Ref Range   WBC 10.1 4.0 - 10.5 K/uL   RBC 3.62 (L) 4.22 - 5.81 MIL/uL   Hemoglobin 10.8 (L) 13.0 - 17.0 g/dL   HCT 32.3 (L) 39.0 - 52.0 %   MCV 89.2 80.0 - 100.0 fL   MCH 29.8 26.0 - 34.0 pg   MCHC 33.4 30.0 - 36.0 g/dL   RDW 14.6 11.5 - 15.5 %   Platelets 245 150 - 400 K/uL   nRBC 0.0 0.0 - 0.2 %   Neutrophils Relative % 56 %   Neutro Abs 5.7 1.7 - 7.7 K/uL   Lymphocytes Relative 29 %   Lymphs Abs 2.9 0.7 - 4.0 K/uL   Monocytes Relative 9 %   Monocytes Absolute 0.9 0.1 - 1.0 K/uL   Eosinophils Relative 5 %   Eosinophils Absolute 0.5 0.0 - 0.5 K/uL   Basophils Relative 1 %   Basophils Absolute 0.1 0.0 - 0.1 K/uL   Immature Granulocytes 0 %   Abs Immature Granulocytes 0.03 0.00 - 0.07 K/uL    Comment: Performed at [facility], [address], [place], [place] [address]  Comprehensive metabolic panel     Status: Abnormal   Collection Time: 07/28/21  1:43 AM  Result Value Ref Range   Sodium 136 135 - 145 mmol/L   Potassium 3.9 3.5 - 5.1 mmol/L   Chloride 104 98 - 111 mmol/L   CO2 25 22 - 32 mmol/L   Glucose, Bld 155 (H) 70 - 99 mg/dL    Comment: Glucose reference range applies only to samples taken after fasting for at least 8 hours.   BUN 11 8 - 23 mg/dL   Creatinine, Ser 0.78 0.61 - 1.24 mg/dL   Calcium 8.6 (L) 8.9 - 10.3 mg/dL   Total Protein 6.4 (L) 6.5 - 8.1 g/dL   Albumin 3.0 (L) 3.5 - 5.0 g/dL   AST 20 15 - 41 U/L   ALT 26 0 - 44 U/L   Alkaline Phosphatase 102 38 - 126 U/L   Total Bilirubin 0.7 0.3 - 1.2 mg/dL   GFR, Estimated >60 >60 mL/min    Comment: (NOTE) Calculated using the CKD-EPI Creatinine Equation (2021)    Anion gap 7 5 - 15    Comment: Performed at [facility], [place] [address] [place], [place] [address]    Current Facility-Administered Medications  Medication Dose Route Frequency Provider Last Rate Last Admin   acetaminophen (TYLENOL)  tablet 650 mg  650 mg Oral Q6H PRN Lattie Haw, MD   650 mg at 07/26/21 1057   atorvastatin (LIPITOR) tablet 40 mg  40 mg Oral Daily  Simmons-Robinson, Makiera, MD   40 mg at 07/28/21 0912   carvedilol (COREG) tablet 3.125 mg  3.125 mg Oral BID WC Ganta, Anupa, DO   3.125 mg at 07/28/21 0913   citalopram (CELEXA) tablet 5 mg  5 mg Oral Daily Damita Dunnings B, MD   5 mg at 07/28/21 0912   finasteride (PROSCAR) tablet 5 mg  5 mg Oral Daily Gerrit Heck, MD   5 mg at 07/28/21 0912   losartan (COZAAR) tablet 50 mg  50 mg Oral Daily Corky Sox, MD   50 mg at 07/28/21 0913   melatonin tablet 5 mg  5 mg Oral QHS Alen Bleacher, MD   5 mg at 07/27/21 2101   metFORMIN (GLUCOPHAGE-XR) 24 hr tablet 500 mg  500 mg Oral BID WC Ganta, Anupa, DO   500 mg at 07/28/21 7096   nicotine polacrilex (NICORETTE) gum 2 mg  2 mg Oral Q4H PRN Lenoria Chime, MD       olopatadine (PATANOL) 0.1 % ophthalmic solution 1 drop  1 drop Both Eyes BID Holley Bouche, MD   1 drop at 07/28/21 0919   polyethylene glycol (MIRALAX / GLYCOLAX) packet 17 g  17 g Oral Daily [name], DO   17 g at 07/28/21 0913   QUEtiapine (SEROQUEL) tablet 50 mg  50 mg Oral QHS Carollee Leitz, MD   50 mg at 07/27/21 2101   senna (SENOKOT) tablet 8.6 mg  1 tablet Oral BID [name], DO   8.6 mg at 07/28/21 0913   tamsulosin (FLOMAX) capsule 0.8 mg  0.8 mg Oral Daily Gladys Damme, MD   0.8 mg at 07/28/21 0913    Psychiatric Specialty Exam:  Presentation  General Appearance: Appropriate for Environment  Eye Contact:Good  Speech:Clear and Coherent  Speech Volume:Normal  Handedness:Right   Mood and Affect  Mood:Euthymic ("good")  Affect:Congruent   Thought Process  Thought Processes:Coherent  Descriptions of Associations:Intact  Orientation:Partial (gets the month, knows it is "20something" but misses the last 2 digits)  Thought Content:Logical  History of Schizophrenia/Schizoaffective disorder:No data  recorded Duration of Psychotic Symptoms:No data recorded Hallucinations:Hallucinations: None  Ideas of Reference:None  Suicidal Thoughts:Suicidal Thoughts: No  Homicidal Thoughts:Homicidal Thoughts: No   Sensorium  Memory:Immediate Fair; Recent Poor; Remote Poor  Judgment:-- (Improving)  Insight:None   Executive Functions  Concentration:Fair  Attention Span:Fair  [place]   Psychomotor Activity  Psychomotor Activity:Psychomotor Activity: Decreased   Assets  Assets:Desire for Improvement; Resilience   Sleep  Sleep:Sleep: Good   Physical Exam: Physical Exam Constitutional:      Appearance: Normal appearance.  HENT:     Head: Normocephalic and atraumatic.  Pulmonary:     Effort: Pulmonary effort is normal.  Skin:    General: Skin is dry.  Neurological:     General: No focal deficit present.     Mental Status: He is alert and oriented to person, place, and time.   Review of Systems  Psychiatric/Behavioral:  Negative for hallucinations and suicidal ideas.   Blood pressure (!) 141/69, pulse 82, temperature 98.4 F (36.9 C), temperature source Oral, resp. rate 19, height 5\' 8"  (1.727 m), weight 104 kg, SpO2 99 %. Body mass index is 34.45 kg/m.  70 year old male who denies prior history of mental illness.  Patient continues to deny SI.  Patient has been noted to do very well on his unit and staff are providing meaningful activities for patient throughout the day.  Patient  appeared more oriented with better concentration and judgment today.  Patient did not talk about the things that he is lost in his life and also endorsed that emotionally he feels stable.  Continued interactions by staff with patient will likely decrease patient feeling lonely and endorsing SI.  At this time patient adamantly denies SI and is able to endorse that he would likely continue to deny SI without a sitter in his room.    Recommendations: -Discontinue 1:1 sitter.  Patient able to contract for safety today - Continue Celexa 5mg  and start 10mg  tom. -CSW to help primary team with disposition to appropriate place according to Pt's needs. -Due to patient's medical needs and H/o Dementia, SNF or a memory care unit would be more appropriate for safe disposition.   Disposition: Per primary   Thank you for this consult we will sign off.   PGY-2 Freida Busman, MD 07/28/2021 12:37 PM

## 2021-07-28 NOTE — Progress Notes (Addendum)
12:30pm: CSW spoke with Lanae who is agreeable for CSW to pursue placement at [facility] if the agency is willing to offer a bed.  CSW faxed clinicals to Judson Roch at [facility] for review.   10am: CSW attempted to reach admissions at [facility] without success - a voicemail was left requesting a return call. Per [employer], the prices are: Assisted Living starts at $3,595 and memory care starts at $5,945.  CSW spoke with Judson Roch at [facility] who states the community consists of 5 villas, 12 patients per villa with one supervising house parent per villa. Judson Roch states there is availability for one male at this time and is willing to review patient's clinicals if legal guardian is agreeable. The monthly cost is $2,400.   CSW attempted to reach Devoria Albe - no answer, a voicemail was left requesting a return call.  [name], MSW, LCSW Transitions of Care  Clinical Social Worker II [phone]

## 2021-07-28 NOTE — Progress Notes (Signed)
FPTS Brief Note Reviewed patient's vitals, recent notes.  Vitals:   07/26/21 1915 07/27/21 2015  BP: 138/72 126/61  Pulse: 94 83  Resp: 18 20  Temp: 98.3 F (36.8 C) 98.2 F (36.8 C)  SpO2: 100%    At this time, no change in plan from day progress note.  Holley Bouche, MD Page [phone] with questions about this patient.

## 2021-07-28 NOTE — Plan of Care (Signed)
  Problem: Education: Goal: Knowledge of General Education information will improve Description Including pain rating scale, medication(s)/side effects and non-pharmacologic comfort measures Outcome: Progressing   Problem: Health Behavior/Discharge Planning: Goal: Ability to manage health-related needs will improve Outcome: Progressing   

## 2021-07-28 NOTE — Progress Notes (Addendum)
Family Medicine Teaching Service Daily Progress Note Intern Pager: [phone]  Patient name: Charles Fox Medical record number: [id number] Date of birth: Aug 25, 1951 Age: 69 y.o. Gender: male  Primary Care Provider: [name], MD Consultants: Psychiatry, Urology Code Status: Full  [name]:  8/19: Admitted for altered mental status and hematuria 8/20: Normal brain MRI and improved mentation  8/21: [facility] 12/30, determined to not have capacity 8/22: Discussion with son revealed patient does not have a safe place for discharge 8/23: PSA elevated.  Urology recommending keeping catheter and rechecking PSA in 1 month. 8/24: Voiding trial 8/25: Suicide precautions, voiding trial failed, Foley reinserted 8/26: Suicide precautions removed 8/29 agitated and combative, delirium precautions in place 9/5: AKI 9/9: AKI resolved 9/18: Foley removed 9/30: reported SI, placed on suicide precautions with sitter 10/1: suicide precautions removed 10/3: Self reported fall 10/20: New guardian: Charles Fox 10/21: Guardianship hearing, granted guardianship over person (not estate) 10/25: Informed by SW that APS worker applying for medicaid  11/6: Suicide precautions, Mo[facility] 8/30 11/9: Suicide precautions removed  Assessment and Plan: Charles Fox is a 70 year-old male admitted for AMS, with hx of dementia and deemed to lack capacity, medically stable awaiting SNF placement.  Mood disturbances Suicidal ideation- resolved Psychiatry increased Celexa from 5 to 10mg  daily yesterday. 1:1 sitter discontinued. No SI today. - Continue Celexa 10mg  daily - Psychiatry signed off, appreciate their care and recommendations for this patient  Dementia No agitation issues overnight.  - Continue Seroquel 50mg  nightly  Housing insecurity/placement issues CSW continuing to work on placement/medicaid application. - Appreciate CSW, will follow progress  History of urinary  retention No urinary symptoms. Voiding appropriately. - Continue finasteride, tamsulosin - Outpatient urology appt 12/20  FEN/GI: Regular PPx: SCDs Dispo:SNF pending placement. Difficult placement, barriers include:  medicaid application status/financial barriers  Subjective:  Mr. Life has no complaints this morning. He was laying in bed telling me he was "trying to get some rest."  Objective: Temp:  [97.6 F (36.4 C)-98.4 F (36.9 C)] 97.6 F (36.4 C) (11/09 2108) Pulse Rate:  [82-92] 89 (11/09 2108) Resp:  [18-20] 20 (11/09 2108) BP: (133-150)/(69-103) 150/70 (11/09 2108) SpO2:  [97 %-99 %] 97 % (11/09 2108) Weight:  [104 kg] 104 kg (11/09 0713) Physical Exam: General: Elderly gentleman laying in bed in no distress Cardiovascular: RRR Respiratory: Clear anteriorly Abdomen: Soft, non-tender, non-distended Extremities: Warm, dry, well-perfused  Laboratory: Recent Labs  Lab 07/28/21 0143  WBC 10.1  HGB 10.8*  HCT 32.3*  PLT 245   Recent Labs  Lab 07/28/21 0143  NA 136  K 3.9  CL 104  CO2 25  BUN 11  CREATININE 0.78  CALCIUM 8.6*  PROT 6.4*  BILITOT 0.7  ALKPHOS 102  ALT 26  AST 20  GLUCOSE 155*      Charles Fox, Charles Fuse, DO 07/28/2021, 10:29 PM PGY-1, [place] Intern pager: [phone], text pages welcome

## 2021-07-29 DIAGNOSIS — F322 Major depressive disorder, single episode, severe without psychotic features: Secondary | ICD-10-CM | POA: Diagnosis not present
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
## 2021-07-29 NOTE — Progress Notes (Signed)
FPTS Brief Note Reviewed patient's vitals, recent notes.  Vitals:   07/28/21 1405 07/28/21 2108  BP: (!) 133/103 (!) 150/70  Pulse: 92 89  Resp: 18 20  Temp: 97.8 F (36.6 C) 97.6 F (36.4 C)  SpO2: 97% 97%   At this time, no change in plan from day progress note.  Holley Bouche, MD Page [phone] with questions about this patient.

## 2021-07-29 NOTE — Plan of Care (Signed)
  Problem: Education: Goal: Knowledge of General Education information will improve Description Including pain rating scale, medication(s)/side effects and non-pharmacologic comfort measures Outcome: Progressing   Problem: Health Behavior/Discharge Planning: Goal: Ability to manage health-related needs will improve Outcome: Progressing   

## 2021-07-30 DIAGNOSIS — F322 Major depressive disorder, single episode, severe without psychotic features: Secondary | ICD-10-CM | POA: Diagnosis not present
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
## 2021-07-30 NOTE — Progress Notes (Signed)
CSW attempted to reach Judson Roch at [facility] without success - a voicemail was left requesting a return call.  [name], MSW, LCSW Transitions of Care  Clinical Social Worker II [phone]

## 2021-07-30 NOTE — Progress Notes (Signed)
FPTS Brief Note Reviewed patient's vitals, recent notes.  Vitals:   07/29/21 1729 07/29/21 2047  BP: (!) 141/78 (!) 170/77  Pulse: 76 80  Resp: 18 20  Temp: 97.6 F (36.4 C) 98.4 F (36.9 C)  SpO2:     At this time, no change in plan from day progress note.  Holley Bouche, MD Page [phone] with questions about this patient.
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
## 2021-07-30 NOTE — Plan of Care (Signed)
  Problem: Education: Goal: Knowledge of General Education information will improve Description Including pain rating scale, medication(s)/side effects and non-pharmacologic comfort measures Outcome: Progressing   Problem: Health Behavior/Discharge Planning: Goal: Ability to manage health-related needs will improve Outcome: Progressing   

## 2021-07-31 DIAGNOSIS — F322 Major depressive disorder, single episode, severe without psychotic features: Secondary | ICD-10-CM | POA: Diagnosis not present
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
## 2021-07-31 NOTE — Progress Notes (Signed)
FPTS Brief Note Reviewed patient's vitals, recent notes.  Vitals:   07/29/21 2047 07/30/21 0900  BP: (!) 170/77 (!) 145/68  Pulse: 80   Resp: 20   Temp: 98.4 F (36.9 C)   SpO2:     At this time, no change in plan from day progress note.  Holley Bouche, MD Page [phone] with questions about this patient.

## 2021-08-01 DIAGNOSIS — F322 Major depressive disorder, single episode, severe without psychotic features: Secondary | ICD-10-CM | POA: Diagnosis not present

## 2021-08-01 NOTE — Progress Notes (Signed)
Family Medicine Teaching Service Daily Progress Note Intern Pager: [phone]  Patient name: Charles Fox Medical record number: [id number] Date of birth: 08-10-1951 Age: 70 y.o. Gender: male  Primary Care Provider: [name], MD Consultants: Psychiatry, Urology Code Status: Full   [name]:  8/19: Admitted for altered mental status and hematuria 8/20: Normal brain MRI and improved mentation  8/21: MoCA 12/30, determined to not have capacity 8/22: Discussion with son revealed patient does not have a safe place for discharge 8/23: PSA elevated.  Urology recommending keeping catheter and rechecking PSA in 1 month. 8/24: Voiding trial 8/25: Suicide precautions, voiding trial failed, Foley reinserted 8/26: Suicide precautions removed 8/29 agitated and combative, delirium precautions in place 9/5: AKI 9/9: AKI resolved 9/18: Foley removed 9/30: reported SI, placed on suicide precautions with sitter 10/1: suicide precautions removed 10/3: Self reported fall 10/20: New guardian: [name] 10/21: Guardianship hearing, granted guardianship over person (not estate) 10/25: Informed by SW that APS worker applying for medicaid  11/6: Suicide precautions, MoCA 8/30 11/9: Suicide precautions removed  Assessment and Plan: Charles Fox is a 70 year-old male admitted for AMS, has a history of dementia (lacks capacity), now medically stable awaiting SNF placement.  Dementia No issues overnight - Continue Seroquel 50 mg qhs, and Celexa 10mg  daily   Hx urinary retention No current urinary issues. Outpatient urology appointment rescheduled for 12/20 - continue finasteride 5mg  daily, tamsulosin .8mg  daily   HTN BP 156/77. Continue Losartan 50mg  daily  DM2  Contiue Metformin 500mg  BID with meals   Housing insecurity  Dispo issues  SW assisting with placement. Additionally, APS investigation underway for concern of someone stealing finances. Visitor  restriction in place   FEN/GI: reg diet  PPx: SCDs  Dispo: SNF pending medicaid application    Subjective:   No acute events overnight. Patient up at the sink shaving when I came into the room. He states he is ready to go home. Discussed that we are working on it which he expressed understanding. Denies any other concerns.   Objective: Temp:  [97.6 F (36.4 C)-98.1 F (36.7 C)] 97.6 F (36.4 C) (11/13 0829) Pulse Rate:  [83-84] 83 (11/13 0829) Resp:  [17-20] 17 (11/13 0829) BP: (156-157)/(77-87) 156/77 (11/13 0829) SpO2:  [99 %-100 %] 100 % (11/13 0829) Weight:  [103.5 kg] 103.5 kg (11/13 0500) Physical Exam: General: alert, shaving his face, NAD Cardiovascular: RRR no mururs Respiratory: CTAB normal WOB Abdomen: soft, non distended Extremities: warm, dry, well perfused   Laboratory: Recent Labs  Lab 07/28/21 0143  WBC 10.1  HGB 10.8*  HCT 32.3*  PLT 245   Recent Labs  Lab 07/28/21 0143  NA 136  K 3.9  CL 104  CO2 25  BUN 11  CREATININE 0.78  CALCIUM 8.6*  PROT 6.4*  BILITOT 0.7  ALKPHOS 102  ALT 26  AST 20  GLUCOSE 155*      Imaging/Diagnostic Tests:  None new   Shary Key, DO 08/02/2067, 11:38 AM PGY-2, [place] Intern pager: [phone], text pages welcome

## 2021-08-01 NOTE — Progress Notes (Signed)
FPTS Brief Note Reviewed patient's vitals, recent notes.  Vitals:   07/31/21 0818 07/31/21 2002  BP: (!) 148/63 (!) 157/87  Pulse: 84 84  Resp: 20 20  Temp: 97.8 F (36.6 C) 98.1 F (36.7 C)  SpO2: 99% 99%   At this time, no change in plan from day progress note.   Eulis Foster, MD Page [phone] with questions about this patient.

## 2021-08-02 DIAGNOSIS — F322 Major depressive disorder, single episode, severe without psychotic features: Secondary | ICD-10-CM | POA: Diagnosis not present

## 2021-08-02 NOTE — Progress Notes (Signed)
FPTS Brief Note Reviewed patient's vitals, recent notes.  Vitals:   08/02/21 0727 08/02/21 1924  BP: (!) 177/79 124/84  Pulse: 77 80  Resp: 18 17  Temp: 97.9 F (36.6 C) 98 F (36.7 C)  SpO2: 100% 100%   At this time, no change in plan from day progress note.  Ezequiel Essex, MD Page [phone] with questions about this patient.

## 2021-08-02 NOTE — Progress Notes (Signed)
FPTS Brief Note Reviewed patient's vitals, recent notes.  Vitals:   08/01/21 0827 08/01/21 0829  BP:  (!) 156/77  Pulse:  83  Resp:  17  Temp: 97.6 F (36.4 C) 97.6 F (36.4 C)  SpO2:  100%   At this time, no change in plan from day progress note.  Gerrit Heck, MD Page [phone] with questions about this patient.

## 2021-08-02 NOTE — Progress Notes (Signed)
Family Medicine Teaching Service Daily Progress Note Intern Pager: [phone]  Patient name: Charles Fox Medical record number: [id number] Date of birth: 12/31/1950 Age: 70 y.o. Gender: male  Primary Care Provider: [name], MD Consultants: Psychiatry, Urology Code Status: Full  [name]:  8/19: Admitted for altered mental status and hematuria 8/20: Normal brain MRI and improved mentation  8/21: MoCA 12/30, determined to not have capacity 8/22: Discussion with son revealed patient does not have a safe place for discharge 8/23: PSA elevated.  Urology recommending keeping catheter and rechecking PSA in 1 month. 8/24: Voiding trial 8/25: Suicide precautions, voiding trial failed, Foley reinserted 8/26: Suicide precautions removed 8/29 agitated and combative, delirium precautions in place 9/5: AKI 9/9: AKI resolved 9/18: Foley removed 9/30: reported SI, placed on suicide precautions with sitter 10/1: suicide precautions removed 10/3: Self reported fall 10/20: New guardian: [name] 10/21: Guardianship hearing, granted guardianship over person (not estate) 10/25: Informed by SW that APS worker applying for medicaid  11/6: Suicide precautions, MoCA 8/30 11/9: Suicide precautions removed  Assessment and Plan: Kellen Dutch is a 70 year-old male admitted for AMS, with hx of dementia (lacking capacity), now medically stable awaiting SNF placement.  Dementia No agitation or other behavioral issues overnight. Pt is pleasantly demented this morning. - Continue Seroquel 50mg  qhs, Celexa 10mg  daily  Hx urinary retention No voiding issues.  - Continue Finasteride 5mg  daily - Contonue Tamsulosin 0.8mg  daily - Outpatient urology follow-up on 12/20  Housing Insecurity, Disposition Issues SW assisting with placement. APS investigation underway for financial concerns of family stealing/mismanaging pt's finances. Visitor restrictions in place -  Appreciate SW   FEN/GI: Regular PPx: SCDs Dispo:SNF pending medicaid/placement  Subjective:  No acute events overnight. Pt feeling well this morning, no complaints. He is asking when he can leave. No other concerns.  Objective: Temp:  [97.9 F (36.6 C)-98 F (36.7 C)] 97.9 F (36.6 C) (11/14 0727) Pulse Rate:  [77-87] 77 (11/14 0727) Resp:  [18] 18 (11/14 0727) BP: (173-177)/(79-88) 177/79 (11/14 0727) SpO2:  [100 %] 100 % (11/14 0727) Physical Exam: General: Pleasant, in no distress Cardiovascular: RRR Respiratory: Clear in all fields Abdomen: Soft, non-distended Extremities: Warm, well-perfused  Laboratory: Recent Labs  Lab 07/28/21 0143  WBC 10.1  HGB 10.8*  HCT 32.3*  PLT 245   Recent Labs  Lab 07/28/21 0143  NA 136  K 3.9  CL 104  CO2 25  BUN 11  CREATININE 0.78  CALCIUM 8.6*  PROT 6.4*  BILITOT 0.7  ALKPHOS 102  ALT 26  AST 20  GLUCOSE 155*    Vaishali Baise, [name], DO 08/02/2021, 9:14 AM PGY-1, [place] Intern pager: [phone], text pages welcome

## 2021-08-02 NOTE — Plan of Care (Signed)
  Problem: Education: Goal: Knowledge of General Education information will improve Description Including pain rating scale, medication(s)/side effects and non-pharmacologic comfort measures Outcome: Progressing   Problem: Health Behavior/Discharge Planning: Goal: Ability to manage health-related needs will improve Outcome: Progressing   

## 2021-08-03 DIAGNOSIS — F322 Major depressive disorder, single episode, severe without psychotic features: Secondary | ICD-10-CM | POA: Diagnosis not present

## 2021-08-03 NOTE — Plan of Care (Signed)
  Problem: Education: Goal: Knowledge of General Education information will improve Description Including pain rating scale, medication(s)/side effects and non-pharmacologic comfort measures Outcome: Progressing   Problem: Health Behavior/Discharge Planning: Goal: Ability to manage health-related needs will improve Outcome: Progressing   

## 2021-08-03 NOTE — Progress Notes (Signed)
FPTS Brief Note Reviewed patient's vitals, recent notes.  Vitals:   08/03/21 1200 08/03/21 2013  BP: (!) 145/85 (!) 171/94  Pulse: 82 88  Resp: 16 17  Temp: 97.7 F (36.5 C) 98 F (36.7 C)  SpO2: 98% 97%   At this time, no change in plan from day progress note.  Ezequiel Essex, MD Page [phone] with questions about this patient.

## 2021-08-03 NOTE — Progress Notes (Signed)
Family Medicine Teaching Service Daily Progress Note Intern Pager: [phone]  Patient name: Charles Fox Medical record number: [id number] Date of birth: June 28, 1951 Age: 70 y.o. Gender: male  Primary Care Provider: [name], MD Consultants: Psychiatry, Urology Code Status: Full  [name]:  8/19: Admitted for altered mental status and hematuria 8/20: Normal brain MRI and improved mentation  8/21: MoCA 12/30, determined to not have capacity 8/22: Discussion with son revealed patient does not have a safe place for discharge 8/23: PSA elevated.  Urology recommending keeping catheter and rechecking PSA in 1 month. 8/24: Voiding trial 8/25: Suicide precautions, voiding trial failed, Foley reinserted 8/26: Suicide precautions removed 8/29 agitated and combative, delirium precautions in place 9/5: AKI 9/9: AKI resolved 9/18: Foley removed 9/30: reported SI, placed on suicide precautions with sitter 10/1: suicide precautions removed 10/3: Self reported fall 10/20: New guardian: Charles Fox 10/21: Guardianship hearing, granted guardianship over person (not estate) 10/25: Informed by SW that APS worker applying for medicaid  11/6: Suicide precautions, MoCA 8/30 11/9: Suicide precautions removed  Assessment and Plan: Charles Fox is a 70 year old male admitted for AMS, with history (lacks capacity, medical decision making) medically stable awaiting SNF placement.  Dementia No agitation, no behavioral issues. Pt is pleasantly demented this morning- eating breakfast and watching the news, says he wants to leave the hospital. Tolerating seroquel and celexa well. - Continue Seroquel 50mg  qhs - Continue Celexa 10mg  daily - Continue melatonin 5mg  daily at bedtime - Weekly labs  Hx urinary retention No voiding issues. - Continue Finasteride 5mg  daily - Continue Tamsulosin 0.8mg  daily  HTN BP  - Continue losartan 50mg  daily  Housing insecurity,  disposition issues CSW assisting with placement. APS investigation continuing for concern of mismanagement of finances by family. Visitor restrictions in place. - Appreciate CSW, will look out for updates   FEN/GI: Regular PPx: SCDs Dispo:SNF pending medicaid application/placement/resolution of APS investigation  Subjective:  Charles Fox feels well this morning. He was eating breakfast and watching the news. He says "I want out" when discussing being in the hospital. He says his clothes feel loose.  Objective: Temp:  [98 F (36.7 C)] 98 F (36.7 C) (11/14 1924) Pulse Rate:  [80] 80 (11/14 1924) Resp:  [17] 17 (11/14 1924) BP: (124)/(84) 124/84 (11/14 1924) SpO2:  [100 %] 100 % (11/14 1924) Weight:  [102 kg] 102 kg (11/15 0500) Physical Exam: General: Pleasant, in no distress Cardiovascular: RRR Respiratory: Clear in all fields Abdomen: Soft, non-distended Extremities: Warm, well-perfused  Laboratory: Recent Labs  Lab 07/28/21 0143  WBC 10.1  HGB 10.8*  HCT 32.3*  PLT 245   Recent Labs  Lab 07/28/21 0143  NA 136  K 3.9  CL 104  CO2 25  BUN 11  CREATININE 0.78  CALCIUM 8.6*  PROT 6.4*  BILITOT 0.7  ALKPHOS 102  ALT 26  AST 20  GLUCOSE 155*    Charles Fox, Charles Fuse, DO 08/03/2021, 8:38 AM PGY-1, [place] Intern pager: [phone], text pages welcome

## 2021-08-03 NOTE — Progress Notes (Signed)
CSW spoke with Anitha at [facility] to discuss patient. CSW sent clinicals to [facility] for review.  [name], MSW, LCSW Transitions of Care  Clinical Social Worker II [phone]

## 2021-08-04 DIAGNOSIS — F322 Major depressive disorder, single episode, severe without psychotic features: Secondary | ICD-10-CM | POA: Diagnosis not present

## 2021-08-04 NOTE — Progress Notes (Signed)
Family Medicine Teaching Service Daily Progress Note Intern Pager: [phone]  Patient name: Charles Fox Medical record number: [id number] Date of birth: 1951-04-22 Age: 70 y.o. Gender: male  Primary Care Provider: [name], MD Consultants: Psychiatry, urology Code Status: Full  [name]:  8/19: Admitted for altered mental status and hematuria 8/20: Normal brain MRI and improved mentation  8/21: MoCA 12/30, determined to not have capacity 8/22: Discussion with son revealed patient does not have a safe place for discharge 8/23: PSA elevated.  Urology recommending keeping catheter and rechecking PSA in 1 month. 8/24: Voiding trial 8/25: Suicide precautions, voiding trial failed, Foley reinserted 8/26: Suicide precautions removed 8/29 agitated and combative, delirium precautions in place 9/5: AKI 9/9: AKI resolved 9/18: Foley removed 9/30: reported SI, placed on suicide precautions with sitter 10/1: suicide precautions removed 10/3: Self reported fall 10/20: New guardian: [name] 10/21: Guardianship hearing, granted guardianship over person (not estate) 10/25: Informed by SW that APS worker applying for medicaid  11/6: Suicide precautions, MoCA 68/30 11/9: Suicide precautions removed  Assessment and Plan: Charles Fox is a 70 y/o M admitted for AMS and urinary difficulty. He lacks capacity and medical decision making and has been medically stable awaiting SNF placement.  Dementia No acute events overnight.  Patient is pleasant in conversation today. - Continue seroquel 50mg  qhs - Continue Celexa 10mg  qd - Continue Melatonin 5mg  qhs - Weekly labs (Cmet, CBC), last performed 11/9  Hx urinary retention No voiding issues. - continue Finasteride 5mg  qd - continue Flomax 0.8mg  qd  HTN BP recently 171/94.  Blood pressure this a.m. 128/56. -Continue Losartan 50mg  qd - Continue coreg 3.125 mg BID -Continue to monitor  Housing  insecurity, disposition issues CSW assisting with placement. APS investigation continuing for concern of mismanagement of finances by family. Visitor restrictions continue to be in place. CSW note reflects that someone from [place] will be assessing patient today. - appreciate CSW, continue to communicate for updates  FEN/GI: Regular PPx: SCDs Dispo:SNF pending medicaid application/placement/resolution of APS investigation.   Subjective:  Patient is doing well this morning.  He is able to eat breakfast and his only concern is that he needs to renew his license in the next few days.  Objective: Temp:  [97.7 F (36.5 C)-98 F (36.7 C)] 98 F (36.7 C) (11/15 2013) Pulse Rate:  [82-88] 88 (11/15 2013) Resp:  [16-17] 17 (11/15 2013) BP: (145-171)/(85-94) 171/94 (11/15 2013) SpO2:  [97 %-98 %] 97 % (11/15 2013) Physical Exam: General: No acute distress, capable in conversation Cardiovascular: RRR, no murmurs auscultated Respiratory: CTA B, no increased work of breathing Abdomen: Soft, nontender  Laboratory: No results for input(s): WBC, HGB, HCT, PLT in the last 168 hours. No results for input(s): NA, K, CL, CO2, BUN, CREATININE, CALCIUM, PROT, BILITOT, ALKPHOS, ALT, AST, GLUCOSE in the last 168 hours.  Invalid input(s): LABALBU   [name], DO 08/04/2021, 7:39 AM PGY-1, [place] Intern pager: [phone], text pages welcome

## 2021-08-04 NOTE — Consult Note (Signed)
   [facility] Inpatient Consult   08/04/2021  VANDY TSUCHIYA 1950-10-15 [id number]   [place] [facility] [ACO] Patient: [employer]  Follow up review:  [facility]ded [facility]   [facility] team notes reviewed for progress.   [name], RN BSN [facility] Liaison  [phone] business mobile phone Toll free office [phone]  Fax number: [phone] [email or web address]

## 2021-08-04 NOTE — Plan of Care (Signed)
  Problem: Health Behavior/Discharge Planning: Goal: Ability to manage health-related needs will improve Outcome: Progressing   Problem: Education: Goal: Knowledge of General Education information will improve Description: Including pain rating scale, medication(s)/side effects and non-pharmacologic comfort measures Outcome: Progressing   

## 2021-08-04 NOTE — Progress Notes (Addendum)
2pm: CSW met with patient at bedside with Anitha from [facility] to complete assessment. Patient was able to participate in assessment and was engaged throughout conversation. Patient agreeable to placement in ALF but was not thrilled after he was told he would be unable to drive himself anymore.  CSW will wait for final decision from [facility] regarding a possible bed offer.  10:30am: CSW spoke with Jasmine at [facility] who is agreeable to review referral. CSW sent clinicals via secure e-mail for review.  CSW spoke with Anitha from [place] who states she will be come to the hospital to assess the patient around 1pm today.  [name], MSW, LCSW Transitions of Care  Clinical Social Worker II [phone]

## 2021-08-05 DIAGNOSIS — G309 Alzheimer's disease, unspecified: Secondary | ICD-10-CM | POA: Diagnosis not present

## 2021-08-05 DIAGNOSIS — F02C2 Dementia in other diseases classified elsewhere, severe, with psychotic disturbance: Secondary | ICD-10-CM | POA: Diagnosis not present

## 2021-08-05 DIAGNOSIS — F323 Major depressive disorder, single episode, severe with psychotic features: Secondary | ICD-10-CM

## 2021-08-05 DIAGNOSIS — F322 Major depressive disorder, single episode, severe without psychotic features: Secondary | ICD-10-CM | POA: Diagnosis not present

## 2021-08-05 NOTE — Progress Notes (Signed)
FPTS Brief Note Reviewed patient's vitals, recent notes.  Vitals:   08/04/21 1202 08/04/21 1933  BP: (!) 128/56 (!) 157/67  Pulse: 68 69  Resp: 18 17  Temp: 98.1 F (36.7 C) 98.1 F (36.7 C)  SpO2: 96% 100%   At this time, no change in plan from day progress note.  Gerrit Heck, MD Page [phone] with questions about this patient.

## 2021-08-05 NOTE — Progress Notes (Signed)
   08/05/21 0920  Clinical Encounter Type  Visited With Patient  Visit Type Follow-up;Spiritual support  Consult/Referral To Chaplain   Chaplain Jorene Guest was rounding on the unit. The patient is sitting on the side of his bed. The patient expressed to [name] his appreciation for how the staff cared for him. Chaplain Baker Janus also accompanies the Medical team member, Octavia Bruckner, to visit the patient and hear storytelling of him being an acquaintance of the patient from high school. The unit secretary also informed [name] of no visitor enforcement of male persons posing as relatives or clergy. Chaplain Baker Janus will inform the Spiritual Care team not to escort clergy to the patient's room. CSW can follow up with [facility] as well. This note was prepared by Jeanine Luz, M.Div..  For questions please contact by phone [phone].

## 2021-08-05 NOTE — Progress Notes (Signed)
CSW spoke with Lanae of [place] [employer] who states the next court date is 08/18/2021 at 2pm - during this court date a guardian of the patient's estate will be granted.  [name], MSW, LCSW Transitions of Care  Clinical Social Worker II [phone]

## 2021-08-05 NOTE — Progress Notes (Signed)
Family Medicine Teaching Service Daily Progress Note Intern Pager: [phone]  Patient name: Charles Fox Medical record number: [id number] Date of birth: 01/14/1951 Age: 70 y.o. Gender: male  Primary Care Provider: [name], MD Consultants: Psychiatry, Urology Code Status: Full  [name]:  8/19: Admitted for altered mental status and hematuria 8/20: Normal brain MRI and improved mentation  8/21: MoCA 12/30, determined to not have capacity 8/22: Discussion with son revealed patient does not have a safe place for discharge 8/23: PSA elevated.  Urology recommending keeping catheter and rechecking PSA in 1 month. 8/24: Voiding trial 8/25: Suicide precautions, voiding trial failed, Foley reinserted 8/26: Suicide precautions removed 8/29 agitated and combative, delirium precautions in place 9/5: AKI 9/9: AKI resolved 9/18: Foley removed 9/30: reported SI, placed on suicide precautions with sitter 10/1: suicide precautions removed 10/3: Self reported fall 10/20: New guardian: [name] 10/21: Guardianship hearing, granted guardianship over person (not estate) 10/25: Informed by SW that APS worker applying for medicaid  11/6: Suicide precautions, MoCA 8/30 11/9: Suicide precautions removed  Assessment and Plan: Charles Fox is a 70 y/o M admitted for AMS and urinary difficulty. He lacks capacity and medical decision making and has been medically stable awaiting placement.   Dementia No acute events overnight.  Patient is pleasant conversation today as previously. -Continue seroquel 50mg  qhs, Celexa 10mg  qd, Melatonin 5mg  qhs  Hx urinary retention No voiding issues.  -Continue Finasteride 5mg  qd, Flomax 0.8mg  qd  HTN Chronic, stable. Most recently 157/67 and 148/73.  -Continue Losartan 50mg  qd and cored 3.125 mg BID  Housing insecurity, disposition issues CSW assisting with placement. APS investigation continuing for concern of  mismanagement of finances by family. Visitor restrictions continue to be in place. Spoken to by representative of [employer] on 10/16.  Awaiting approval by them.  FEN/GI: Regular PPx: SCDs Dispo:SNF pending medicaid application/placement/resolution of APS investigation.   Subjective:  Patient states he is doing well today.  He is aware his birthday is tomorrow and he is hoping to get a motorcycle.  Objective: Temp:  [97.9 F (36.6 C)-98.1 F (36.7 C)] 97.9 F (36.6 C) (11/17 0755) Pulse Rate:  [68-69] 69 (11/17 0755) Resp:  [17-18] 18 (11/17 0755) BP: (128-157)/(56-73) 148/73 (11/17 0755) SpO2:  [96 %-100 %] 100 % (11/16 1933) Physical Exam: General: NAD, pleasant in conversation Cardiovascular: RRR, no murmurs auscultated Respiratory: CTAB, no increased work of breathing  Laboratory: No results for input(s): WBC, HGB, HCT, PLT in the last 168 hours. No results for input(s): NA, K, CL, CO2, BUN, CREATININE, CALCIUM, PROT, BILITOT, ALKPHOS, ALT, AST, GLUCOSE in the last 168 hours.  Invalid input(s): LABALBU  Imaging/Diagnostic Tests: No results found.  [name], DO 08/05/2021, 8:07 AM PGY-1, [place] Intern pager: [phone], text pages welcome

## 2021-08-05 NOTE — Progress Notes (Addendum)
Family Medicine Teaching Service Daily Progress Note Intern Pager: [phone]  Patient name: Charles Fox Medical record number: [id number] Date of birth: Feb 27, 1951 Age: 70 y.o. Gender: male  Primary Care Provider: [name], MD Consultants: Psychiatry, Urology Code Status: Full  [name]:  8/19: Admitted for altered mental status and hematuria 8/20: Normal brain MRI and improved mentation  8/21: MoCA 12/30, determined to not have capacity 8/22: Discussion with son revealed patient does not have a safe place for discharge 8/23: PSA elevated.  Urology recommending keeping catheter and rechecking PSA in 1 month. 8/24: Voiding trial 8/25: Suicide precautions, voiding trial failed, Foley reinserted 8/26: Suicide precautions removed 8/29 agitated and combative, delirium precautions in place 9/5: AKI 9/9: AKI resolved 9/18: Foley removed 9/30: reported SI, placed on suicide precautions with sitter 10/1: suicide precautions removed 10/3: Self reported fall 10/20: New guardian: [name] 10/21: Guardianship hearing, granted guardianship over person (not estate) 10/25: Informed by SW that APS worker applying for medicaid  11/6: Suicide precautions, MoCA 8/30 11/9: Suicide precautions removed  Assessment and Plan: Charles Fox is a 70 y/o M admitted for AMS and urinary difficulty. He lacks capacity and medical decision making and has been medically stable awaiting placement. Currently awaiting court date for guarding of estate.  Dementia No acute events overnight.  Patient's pleasant conversation today as previously. -Continue Seroquel 50 mg nightly, Celexa 10 mg daily, melatonin 5 mg nightly  Hx urinary retention No voiding issues. -Continue finasteride 5 mg daily, Flomax 0.8 mg daily  HTN Chronic, stable.  Most recently 146/93. -Continue losartan 50 mg daily and Coreg 3.125 mg twice daily  Housing insecurity, disposition issues CSW  assisting with placement. APS investigation continuing for concern of mismanagement of finances of family. Pt has court date on 11/30 to establish guarding of estate. Visitor restrictions continue to be in place. Spoken to by representative of [facility] on 11/16.    FEN/GI: Regular PPx: SCDs Dispo:SNF pending Medicaid application/placement/resolution of APS investigation and establishment of guardian of estate.   Subjective:  Patient is doing well this morning and has decorated room eating breakfast.  He is noting that he has been feeling down because he does not know what to do with his life at the moment.  Objective: Temp:  [97.8 F (36.6 C)-98 F (36.7 C)] 97.8 F (36.6 C) (11/18 0945) Pulse Rate:  [73-89] 73 (11/18 0945) Resp:  [14-18] 18 (11/18 0945) BP: (131-146)/(84-93) 146/93 (11/18 0945) SpO2:  [100 %] 100 % (11/17 2301) Weight:  [105.6 kg] 105.6 kg (11/18 0500) Physical Exam: General: NAD, pleasant conversation Cardiovascular: RRR, no murmurs auscultated Respiratory: CTAB, no increased work of breathing  Laboratory: No results for input(s): WBC, HGB, HCT, PLT in the last 168 hours. No results for input(s): NA, K, CL, CO2, BUN, CREATININE, CALCIUM, PROT, BILITOT, ALKPHOS, ALT, AST, GLUCOSE in the last 168 hours.  Invalid input(s): LABALBU  Imaging/Diagnostic Tests: No results found.   [name], DO 08/06/2021, 2:30 PM PGY-1, [place] Intern pager: [phone], text pages welcome

## 2021-08-06 DIAGNOSIS — R4182 Altered mental status, unspecified: Secondary | ICD-10-CM | POA: Diagnosis not present

## 2021-08-06 DIAGNOSIS — F322 Major depressive disorder, single episode, severe without psychotic features: Secondary | ICD-10-CM | POA: Diagnosis not present

## 2021-08-06 DIAGNOSIS — Z658 Other specified problems related to psychosocial circumstances: Secondary | ICD-10-CM | POA: Diagnosis not present

## 2021-08-06 DIAGNOSIS — G309 Alzheimer's disease, unspecified: Secondary | ICD-10-CM | POA: Diagnosis not present

## 2021-08-06 NOTE — Progress Notes (Signed)
FPTS Brief Note Reviewed patient's vitals, recent notes.  Vitals:   08/05/21 2301 08/06/21 0945  BP: 131/84 (!) 146/93  Pulse: 89 73  Resp: 14 18  Temp: 98 F (36.7 C) 97.8 F (36.6 C)  SpO2: 100%    At this time, no change in plan from day progress note.  Sharion Settler, DO Page [phone] with questions about this patient.

## 2021-08-06 NOTE — Progress Notes (Signed)
CSW met with patient at bedside to wish him a happy birthday. CSW asked patient if he had fun at his birthday party yesterday but he stated he does not remember his party. CSW reminded patient that efforts were being made to get him placed into an ALF and he stated agreement. Patient had no issues or concerns to address at this time.  [name], MSW, LCSW Transitions of Care  Clinical Social Worker II [phone]

## 2021-08-06 NOTE — Progress Notes (Signed)
FPTS Brief Note Reviewed patient's vitals, recent notes.  Vitals:   08/05/21 0755 08/05/21 2301  BP: (!) 148/73 131/84  Pulse: 69 89  Resp: 18 14  Temp: 97.9 F (36.6 C)   SpO2:  100%   At this time, no change in plan from day progress note. Dr. Jinny Sanders and I went up to see the patient to wish him a Happy Birthday. He was thankful for the company and a little sad about not having his family around. His room was full of balloons, cake, and his presents from his party yesterday. Thank you to the floor for coordinating a celebration for Charles Fox!   Sharion Settler, DO Page [phone] with questions about this patient.

## 2021-08-07 DIAGNOSIS — F322 Major depressive disorder, single episode, severe without psychotic features: Secondary | ICD-10-CM | POA: Diagnosis not present

## 2021-08-07 DIAGNOSIS — G309 Alzheimer's disease, unspecified: Secondary | ICD-10-CM | POA: Diagnosis not present

## 2021-08-07 DIAGNOSIS — F02C2 Dementia in other diseases classified elsewhere, severe, with psychotic disturbance: Secondary | ICD-10-CM | POA: Diagnosis not present

## 2021-08-07 LAB — GLUCOSE, CAPILLARY: Glucose-Capillary: 165 mg/dL — ABNORMAL HIGH (ref 70–99)

## 2021-08-07 NOTE — Progress Notes (Signed)
Family Medicine Teaching Service Daily Progress Note Intern Pager: [phone]  Patient name: Charles Fox Medical record number: [id number] Date of birth: 02/10/1951 Age: 70 y.o. Gender: male  Primary Care Provider: [name], MD Consultants: Psychiatry, urology Code Status: Full   [name]:  8/19: Admitted for altered mental status and hematuria 8/20: Normal brain MRI and improved mentation  8/21: MoCA 12/30, determined to not have capacity 8/22: Discussion with son revealed patient does not have a safe place for discharge 8/23: PSA elevated.  Urology recommending keeping catheter and rechecking PSA in 1 month. 8/24: Voiding trial 8/25: Suicide precautions, voiding trial failed, Foley reinserted 8/26: Suicide precautions removed 8/29 agitated and combative, delirium precautions in place 9/5: AKI 9/9: AKI resolved 9/18: Foley removed 9/30: reported SI, placed on suicide precautions with sitter 10/1: suicide precautions removed 10/3: Self reported fall 10/20: New guardian: [name] 10/21: Guardianship hearing, granted guardianship over person (not estate) 10/25: Informed by SW that APS worker applying for medicaid  11/6: Suicide precautions, MoCA 8/30 11/9: Suicide precautions removed  Assessment and Plan: Charles Fox is a 70 y/o M admitted for AMS and urinary difficulty.  He lacks capacity medical decision making has been medically stable awaiting placement.  Currently awaiting court date for guardian of estate.   Dementia  Stable. No events overnight.  -Continue Seroquel 50 mg nightly, Celexa 10 mg daily, melatonin 5 mg nightly  Hx of urinary retention No issues with voiding - continue finasteride 5mg  and Flomax 0.8mg  daily  HTN, chronic and stable BP 127/77 - continue Losartan 50mg  daily and Coreg 3.125 mg BID  Depressed mood We are working on getting him a support animal   Dispo/ housing insecurities  SW following- pending  acceptance to [facility].  APS investigation in place for concern for mismanagement of finances by family. Visitor restrictions in place. Court date 11/30    FEN/GI: Regular diet  PPx: SCDs   Disposition: SNF pending placement   Subjective:   Patient sleeping comfortably, denied any pain, questions or concerns.   Objective: Temp:  [97.3 F (36.3 C)-98 F (36.7 C)] 97.3 F (36.3 C) (11/19 1358) Pulse Rate:  [77-80] 77 (11/19 1358) Resp:  [18-19] 18 (11/19 1358) BP: (119-140)/(71-92) 119/71 (11/19 1358) SpO2:  [98 %] 98 % (11/19 0500) Weight:  [104.5 kg] 104.5 kg (11/19 0500) Physical Exam: General: sleeping, NAD  Cardiovascular: RRR no murmurs  Respiratory: CTAB normal WOB Abdomen: soft, non distended  Extremities: warm, dry, no edema   Laboratory: No results for input(s): WBC, HGB, HCT, PLT in the last 168 hours. No results for input(s): NA, K, CL, CO2, BUN, CREATININE, CALCIUM, PROT, BILITOT, ALKPHOS, ALT, AST, GLUCOSE in the last 168 hours.  Invalid input(s): LABALBU   Imaging/Diagnostic Tests: None new   Shary Key, DO 08/07/2021, 11:03 PM PGY-2, [place] Intern pager: [phone], text pages welcome

## 2021-08-07 NOTE — Progress Notes (Signed)
Family Medicine Teaching Service Daily Progress Note Intern Pager: [phone]  Patient name: Charles Fox Medical record number: [id number] Date of birth: [birth year] Age: 70 y.o. Gender: male  Primary Care Provider: [name], MD Consultants: Psychiatry, urology Code Status: Full  [name]:  8/19: Admitted for altered mental status and hematuria 8/20: Normal brain MRI and improved mentation  8/21: MoCA 12/30, determined to not have capacity 8/22: Discussion with son revealed patient does not have a safe place for discharge 8/23: PSA elevated.  Urology recommending keeping catheter and rechecking PSA in 1 month. 8/24: Voiding trial 8/25: Suicide precautions, voiding trial failed, Foley reinserted 8/26: Suicide precautions removed 8/29 agitated and combative, delirium precautions in place 9/5: AKI 9/9: AKI resolved 9/18: Foley removed 9/30: reported SI, placed on suicide precautions with sitter 10/1: suicide precautions removed 10/3: Self reported fall 10/20: New guardian: [name] 10/21: Guardianship hearing, granted guardianship over person (not estate) 10/25: Informed by SW that APS worker applying for medicaid  11/6: Suicide precautions, MoCA 8/30 11/9: Suicide precautions removed  Assessment and Plan: Charles Fox is a 70 y/o M admitted for AMS and urinary difficulty.  He lacks capacity medical decision making has been medically stable awaiting placement.  Currently awaiting court date for guardian of estate.  Dementia No acute events overnight.  Patient is pleasant in conversation today as previously. -Continue Seroquel 50 mg nightly, Celexa 10 mg daily, melatonin 5 mg nightly  History of urinary retention No voiding issues. -Continue finasteride 5 mg daily, Flomax 0.8 mg daily  Hypertension Chronic, stable.  Most recently 140/92. -Continue losartan 50 mg daily and Coreg 3.125 mg twice daily  Housing insecurity, disposition  issues CSW assisting with placement.  APS investigation continuing for concern of mismanagement finances and family.  Patient has court date on 11/30 to establish guardian of estate.  Visitor restrictions continue to be in place.  Pending acceptance to [facility] after being visited by representative on 11/16.  Depressed mood Although patient lacks the ability to remember short-term events, he generally admits to low mood and lack of direction in life. He continues to voice missing people in his life and experiences sadness for not having any friends and nothing to do.  Plan at this time is to reach out to several volunteer support animal groups.  FEN/GI: Regular PPx: SCDs Dispo:SNF pending Medicaid application/placement/resolution of APS investigation and establishment of guardian of estate.   Subjective:  Patient is feeling sad today and missing his parents.  States he has no friends and does not know what to do with his life.  Objective: Temp:  [97.8 F (36.6 C)-98 F (36.7 C)] 98 F (36.7 C) (11/19 0500) Pulse Rate:  [73-80] 80 (11/19 0500) Resp:  [18-19] 19 (11/19 0500) BP: (140-146)/(92-93) 140/92 (11/19 0500) SpO2:  [98 %] 98 % (11/19 0500) Weight:  [104.5 kg] 104.5 kg (11/19 0500) Physical Exam: General: NAD, appropriate conversation Cardiovascular: RRR, no murmurs auscultated Respiratory: CTA B, no increased work of breathing Psych: normal behavior, depressed mood  Laboratory: No results for input(s): WBC, HGB, HCT, PLT in the last 168 hours. No results for input(s): NA, K, CL, CO2, BUN, CREATININE, CALCIUM, PROT, BILITOT, ALKPHOS, ALT, AST, GLUCOSE in the last 168 hours.  Invalid input(s): LABALBU  Imaging/Diagnostic Tests: No results found.  [name], DO 08/07/2021, 7:12 AM PGY-1, [place] Intern pager: [phone], text pages welcome

## 2021-08-08 DIAGNOSIS — G309 Alzheimer's disease, unspecified: Secondary | ICD-10-CM | POA: Diagnosis not present

## 2021-08-08 DIAGNOSIS — F322 Major depressive disorder, single episode, severe without psychotic features: Secondary | ICD-10-CM | POA: Diagnosis not present

## 2021-08-08 DIAGNOSIS — F02C2 Dementia in other diseases classified elsewhere, severe, with psychotic disturbance: Secondary | ICD-10-CM | POA: Diagnosis not present

## 2021-08-08 NOTE — Progress Notes (Signed)
FPTS Brief Note Reviewed patient's vitals, recent notes.  Vitals:   08/08/21 1224 08/08/21 2129  BP: 131/76 135/69  Pulse: 82 81  Resp: 18 18  Temp: 98.1 F (36.7 C) 97.7 F (36.5 C)  SpO2:  100%   At this time, no change in plan from day progress note.  Sharion Settler, DO Page [phone] with questions about this patient.

## 2021-08-08 NOTE — Progress Notes (Signed)
FPTS Brief Note Reviewed patient's vitals, recent notes.  Vitals:   08/07/21 0500 08/07/21 1358  BP: (!) 140/92 119/71  Pulse: 80 77  Resp: 19 18  Temp: 98 F (36.7 C) (!) 97.3 F (36.3 C)  SpO2: 98%    At this time, no change in plan from day progress note.  Shary Key, DO Page [phone] with questions about this patient.

## 2021-08-09 DIAGNOSIS — E1149 Type 2 diabetes mellitus with other diabetic neurological complication: Secondary | ICD-10-CM | POA: Diagnosis not present

## 2021-08-09 DIAGNOSIS — Z658 Other specified problems related to psychosocial circumstances: Secondary | ICD-10-CM | POA: Diagnosis not present

## 2021-08-09 DIAGNOSIS — G309 Alzheimer's disease, unspecified: Secondary | ICD-10-CM | POA: Diagnosis not present

## 2021-08-09 DIAGNOSIS — F322 Major depressive disorder, single episode, severe without psychotic features: Secondary | ICD-10-CM | POA: Diagnosis not present

## 2021-08-09 MED ORDER — OLOPATADINE HCL 0.1 % OP SOLN
1.0000 [drp] | Freq: Two times a day (BID) | OPHTHALMIC | Status: DC | PRN
  Filled 2021-08-09: qty 5

## 2021-08-09 NOTE — Progress Notes (Addendum)
2pm: CSW spoke with Lanae who states that the patient cannot receive any visitors at this time but can use the phone as he desires. Lanae states the rule will not change until after the patient is appointed a permanent guardian on 08/18/2021 and further information is obtained from the ongoing investigation.  1:30pm: CSW was informed by RN CM that a "friend" [name] of the patient called and wanted to discuss patient. RN CM informed [name] to contact Devoria Albe at [employer] for more information.  12pm: CSW spoke with Estill Bamberg from [facility] to provide her with additional information. Estill Bamberg states the lowest rate for a companion room at ALF is $2,750 monthly. Estill Bamberg to speak with patient's guardian Devoria Albe to discuss the financial requirement for placement.  [name], MSW, LCSW Transitions of Care  Clinical Social Worker II [phone]

## 2021-08-09 NOTE — Progress Notes (Signed)
Family Medicine Teaching Service Daily Progress Note Intern Pager: [phone]  Patient name: Charles Fox Medical record number: [id number] Date of birth: Aug 23, 1951 Age: 70 y.o. Gender: male  Primary Care Provider: [name], MD Consultants: Psychiatry (S/O), urology (S/O) Code Status: Full  [name]:  8/19: Admitted for altered mental status and hematuria 8/20: Normal brain MRI and improved mentation  8/21: MoCA 12/30, determined to not have capacity 8/22: Discussion with son revealed patient does not have a safe place for discharge 8/23: PSA elevated.  Urology recommending keeping catheter and rechecking PSA in 1 month. 8/24: Voiding trial 8/25: Suicide precautions, voiding trial failed, Foley reinserted 8/26: Suicide precautions removed 8/29 agitated and combative, delirium precautions in place 9/5: AKI 9/9: AKI resolved 9/18: Foley removed 9/30: reported SI, placed on suicide precautions with sitter 10/1: suicide precautions removed 10/3: Self reported fall 10/20: New guardian: [name] 10/21: Guardianship hearing, granted guardianship over person (not estate) 10/25: Informed by SW that APS worker applying for medicaid  11/6: Suicide precautions, MoCA 8/30 11/9: Suicide precautions removed 11/19: Contacted liaison for [employer]  Assessment and Plan: Charles Fox is a 70 y/o M admitted for AMS and urinary difficulty.  He lacks capacity medical decision making has been medically stable awaiting placement.  Currently awaiting court date for guardian of estate.  Dementia No acute events overnight.  Patient is pleasant conversation days previously -Continue Seroquel 50 mg nightly, Celexa 10 mg daily, melatonin 5 mg nightly  History of urinary retention No voiding issues. -Continue finasteride 5 mg daily, Flomax 0.8 mg daily  Hypertension Chronic, stable.  Blood pressure this a.m. 157/84.  Previously in the 120s to 130s over  60s to 70s. -Continue losartan 50 mg daily and Coreg 3.125 mg twice daily  Housing insecurity, disposition issues CSW assisting with placement.  APS investigation continuing for concern of mismanagement finances and family.  Patient has court date on 11/30 to establish guardian of estate.  Visitor restrictions continue to be in place.  Pending acceptance to [facility] after being visited by representative on 11/16.  Depressed mood Patient consistently lacks ability to member short-term events.  Temporarily voices low mood and lack of direction in life.  Notes that he enjoys dogs.  Contacted liaison between in [place] partners and [employer], Geanie Kenning.  Currently attempting to arrange semi-regular therapy dog visits.  For now, Geanie Kenning will be visiting patient tomorrow with support dogs.  Dry eyes Patient has been receiving patanol drops for dry eyes. No voiced complaints in the time being.  - Changed Patanol drops from BID to prn  FEN/GI: Regular PPx: SCDs Dispo:SNF pending Medicaid application/placement/resolution of APS investigation and establishment of guardian of estate.   Subjective:  Patient is feeling very tired today.  He is agreeable to dog visits and denies drug allergies.  Objective: Temp:  [97.7 F (36.5 C)-98.1 F (36.7 C)] 97.9 F (36.6 C) (11/21 0728) Pulse Rate:  [65-82] 65 (11/21 0728) Resp:  [17-18] 17 (11/21 0728) BP: (131-157)/(69-84) 157/84 (11/21 0728) SpO2:  [99 %-100 %] 99 % (11/21 0728) Weight:  [104.3 kg] 104.3 kg (11/21 0500) Physical Exam: General: Well-developed well-nourished, no acute distress, appropriate in conversation Cardiovascular: RRR, no murmurs auscultated Respiratory: CTA B, no increased work of breathing  Laboratory: No results for input(s): WBC, HGB, HCT, PLT in the last 168 hours. No results for input(s): NA, K, CL, CO2, BUN, CREATININE, CALCIUM, PROT, BILITOT, ALKPHOS, ALT, AST, GLUCOSE  in the last 168 hours.  Invalid input(s):  LABALBU  Imaging/Diagnostic Tests: No results found.  [name], DO 08/09/2021, 7:34 AM PGY-1, [place] Intern pager: [phone], text pages welcome

## 2021-08-09 NOTE — TOC Progression Note (Signed)
Transition of Care [facility]) - Progression Note    Patient Details  Name: Charles Fox MRN: [id number] Date of Birth: 04/15/1951  Transition of Care [facility]) CM/SW [place], RN Phone Number: 08/09/2021, 1:07 PM  Clinical Narrative:    CM received a call from [facility] secretary that the patient had family inquiring about his medical condition.  I spoke with [name] [phone] states she is an old friend and coworker.  [name], patient's friend was given Devoria Albe, Interim guardian's number to contact for information - [phone].  CM and MSW continue to follow the patient for ALF placement - clinicals currently being reviewed at [facility] at this time.   Expected Discharge Plan: Skilled Nursing Facility Barriers to Discharge: SNF Pending bed offer  Expected Discharge Plan and Services Expected Discharge Plan: [place] In-house Referral: Clinical Social Work, PCP / Psychologist, educational Discharge Planning Services: CM Consult Post Acute Care Choice: [place] Living arrangements for the past 2 months: Homeless (Son requests that patient discharge to his care after SNF placement.)                                       Social Determinants of Health (SDOH) Interventions    Readmission Risk Interventions No flowsheet data found.

## 2021-08-09 NOTE — Progress Notes (Signed)
FPTS Brief Note Reviewed patient's vitals, recent notes.  Vitals:   08/08/21 2129 08/09/21 0728  BP: 135/69 (!) 157/84  Pulse: 81 65  Resp: 18 17  Temp: 97.7 F (36.5 C) 97.9 F (36.6 C)  SpO2: 100% 99%   At this time, no change in plan from day progress note.  Sharion Settler, DO Page [phone] with questions about this patient.

## 2021-08-10 DIAGNOSIS — R4182 Altered mental status, unspecified: Secondary | ICD-10-CM | POA: Diagnosis not present

## 2021-08-10 DIAGNOSIS — F322 Major depressive disorder, single episode, severe without psychotic features: Secondary | ICD-10-CM | POA: Diagnosis not present

## 2021-08-10 NOTE — Progress Notes (Signed)
FPTS Brief Note Reviewed patient's vitals, recent notes.  Vitals:   08/10/21 1214 08/10/21 1947  BP: 128/71 124/80  Pulse: 76 80  Resp: 18 16  Temp: 98 F (36.7 C) 98 F (36.7 C)  SpO2: 99% 99%   At this time, no change in plan from day progress note.  Sharion Settler, DO Page [phone] with questions about this patient.

## 2021-08-10 NOTE — Progress Notes (Signed)
Family Medicine Teaching Service Daily Progress Note Intern Pager: [phone]  Patient name: Charles Fox Medical record number: [id number] Date of birth: November 15, 1950 Age: 70 y.o. Gender: male  Primary Care Provider: [name], MD Consultants: Psychiatry (s/o), urology (s/o) Code Status: Full  [name]:  8/19: Admitted for altered mental status and hematuria 8/20: Normal brain MRI and improved mentation  8/21: MoCA 12/30, determined to not have capacity 8/22: Discussion with son revealed patient does not have a safe place for discharge 8/23: PSA elevated.  Urology recommending keeping catheter and rechecking PSA in 1 month. 8/24: Voiding trial 8/25: Suicide precautions, voiding trial failed, Foley reinserted 8/26: Suicide precautions removed 8/29 agitated and combative, delirium precautions in place 9/5: AKI 9/9: AKI resolved 9/18: Foley removed 9/30: reported SI, placed on suicide precautions with sitter 10/1: suicide precautions removed 10/3: Self reported fall 10/20: New guardian: [name] 10/21: Guardianship hearing, granted guardianship over person (not estate) 10/25: Informed by SW that APS worker applying for medicaid  11/6: Suicide precautions, MoCA 8/30 11/9: Suicide precautions removed 11/19: Contacted liaison for [place] [employer]  Assessment and Plan: Charles Fox is a 70 y/o M admitted for AMS and urinary difficulty. He lacks capacity, medical decision making has been medically stable awaiting placement.  Currently awaiting court date for guardian of estate.  Housing insecurity, disposition issues CSW assisting with placement.  APS investigation continuing for concern of mismanagement of finances by family.  Patient has court date on 11/30 to establish guardian of estate.  Visitor restrictions continue to be in place.   Depressed mood Patient temporarily voices low mood and lack of direction in life on some days.  Notes that he  enjoys dogs and he used to have a dog and cat that were his best friend.  Liaison between and [employer] partners and Kathryn, Geanie Kenning, will be visiting patient today with her support dog.  Dementia No acute events overnight.  Patient is pleasant in conversation as previous days. -Continue Seroquel 50 mg nightly, Celexa 10 mg daily, melatonin 5 mg nightly  History of urinary retention No voiding issues. -Continue finasteride 5 mg daily, Flomax 0.8 mg daily  Hypertension Chronic, stable. -Continue losartan 50 mg daily and Coreg 3.125 mg twice daily  FEN/GI: Regular PPx: SCDs Dispo:SNF pending APS investigation and establishment of guardian of estate.   Subjective:  Patient is well this morning.  He is agreeable to having support dog visit today.  He recalls his dog Mickey and cat Rango that he used to have as they were his best friends  Objective: Temp:  [98.1 F (36.7 C)] 98.1 F (36.7 C) (11/22 0136) Pulse Rate:  [82-88] 82 (11/22 0136) Resp:  [16-18] 16 (11/22 0136) BP: (136-172)/(69-80) 136/80 (11/22 0136) SpO2:  [97 %-100 %] 97 % (11/22 0136) Physical Exam: General: WDWN, NAD Cardiovascular: RRR, no murmurs auscultated Respiratory: CTA B, no increased work of breathing  Laboratory: No results for input(s): WBC, HGB, HCT, PLT in the last 168 hours. No results for input(s): NA, K, CL, CO2, BUN, CREATININE, CALCIUM, PROT, BILITOT, ALKPHOS, ALT, AST, GLUCOSE in the last 168 hours.  Invalid input(s): LABALBU  Imaging/Diagnostic Tests: No results found.  [name], DO 08/10/2021, 9:53 AM PGY-1, [place] Intern pager: [phone], text pages welcome

## 2021-08-10 NOTE — Progress Notes (Signed)
Family Medicine Teaching Service Daily Progress Note Intern Pager: [phone]  Patient name: Charles Fox Medical record number: [id number] Date of birth: 1951/08/29 Age: 70 y.o. Gender: male  Primary Care Provider: [name], MD Consultants: Psychiatry (s/o), urology (s/o) Code Status: Full  [name]:  8/19: Admitted for altered mental status and hematuria 8/20: Normal brain MRI and improved mentation  8/21: MoCA 12/30, determined to not have capacity 8/22: Discussion with son revealed patient does not have a safe place for discharge 8/23: PSA elevated.  Urology recommending keeping catheter and rechecking PSA in 1 month. 8/24: Voiding trial 8/25: Suicide precautions, voiding trial failed, Foley reinserted 8/26: Suicide precautions removed 8/29 agitated and combative, delirium precautions in place 9/5: AKI 9/9: AKI resolved 9/18: Foley removed 9/30: reported SI, placed on suicide precautions with sitter 10/1: suicide precautions removed 10/3: Self reported fall 10/20: New guardian: Charles Fox 10/21: Guardianship hearing, granted guardianship over person (not estate) 10/25: Informed by SW that APS worker applying for medicaid  11/6: Suicide precautions, MoCA 8/30 11/9: Suicide precautions removed 11/19: Contacted liaison for Charles Fox 11/22-visited by Charles Fox  Assessment and Plan: Charles Fox is a 70 y/o M admitted for AMS and urinary difficulty. He lacks capacity, medical decision making has been medically stable awaiting placement. Currently awaiting court date for guardian of estate.  Housing insecurity, disposition issues CSW assisting with placement.  APS investigation continuing for concern of mismanagement of finances by family.  Patient has court date on 11/30 to establish guardian of estate.  Visitor restrictions continue be in place.  Dementia No acute events overnight. -Continue Seroquel 50 mg nightly, Celexa 10  mg daily, melatonin 5 mg nightly  History of urinary retention No voiding issues. -Continue finasteride 5 mg daily, Flomax 0.8 mg daily  Hypertension Chronic, stable.  Blood pressures in the 120s to 130s over 70s to 80s. -Continue losartan 50 mg daily Coreg 3.125 mg twice daily  FEN/GI: Regular PPx: SCDs Dispo:SNF pending placement after court date to determine guardian of estate.   Subjective:  Pt is doing well today. States he is happy he was visited by [facility] the therapy dog yesterday and he hopes other dogs will come by to visit him as well.   Objective: Temp:  [98 F (36.7 C)-98.1 F (36.7 C)] 98 F (36.7 C) (11/22 1947) Pulse Rate:  [76-82] 80 (11/22 1947) Resp:  [16-18] 16 (11/22 1947) BP: (124-136)/(71-80) 124/80 (11/22 1947) SpO2:  [97 %-99 %] 99 % (11/22 1947) Weight:  [104 kg] 104 kg (11/22 1947) Physical Exam: General: WDWN, NAD, appropriate in conversation Cardiovascular: RRR, no murmurs auscultated Respiratory: CTAB, no increased work of breathing  Laboratory: No results for input(s): WBC, HGB, HCT, PLT in the last 168 hours. No results for input(s): NA, K, CL, CO2, BUN, CREATININE, CALCIUM, PROT, BILITOT, ALKPHOS, ALT, AST, GLUCOSE in the last 168 hours.  Invalid input(s): LABALBU  Imaging/Diagnostic Tests: No results found.  Charles Guiles, DO 08/10/2021, 10:48 PM PGY-1, [place] Intern pager: [phone], text pages welcome

## 2021-08-11 DIAGNOSIS — F322 Major depressive disorder, single episode, severe without psychotic features: Secondary | ICD-10-CM | POA: Diagnosis not present

## 2021-08-11 DIAGNOSIS — R4182 Altered mental status, unspecified: Secondary | ICD-10-CM | POA: Diagnosis not present

## 2021-08-11 DIAGNOSIS — F02C2 Dementia in other diseases classified elsewhere, severe, with psychotic disturbance: Secondary | ICD-10-CM | POA: Diagnosis not present

## 2021-08-11 DIAGNOSIS — N399 Disorder of urinary system, unspecified: Secondary | ICD-10-CM

## 2021-08-11 DIAGNOSIS — Z599 Problem related to housing and economic circumstances, unspecified: Secondary | ICD-10-CM

## 2021-08-11 DIAGNOSIS — F0390 Unspecified dementia without behavioral disturbance: Secondary | ICD-10-CM | POA: Diagnosis not present

## 2021-08-11 DIAGNOSIS — G309 Alzheimer's disease, unspecified: Secondary | ICD-10-CM | POA: Diagnosis not present

## 2021-08-11 DIAGNOSIS — I10 Essential (primary) hypertension: Secondary | ICD-10-CM | POA: Diagnosis not present

## 2021-08-11 NOTE — Progress Notes (Signed)
Patient adamant he is leaving the unit, multiple staff members have tried to redirect the patient and re-orient him unsuccessfully. Security was called to the unit to assist, physician aware, social work notified and RN obtained phone number for legal guardian [phone]) Charles Fox. Called Charles Fox and left message for her to return call.

## 2021-08-11 NOTE — Progress Notes (Signed)
Devoria Albe returned phone call, made her aware of patient attempting to leave the unit. Informed her of all preventative measures being taken and that provider was coming up to see him.

## 2021-08-11 NOTE — Progress Notes (Signed)
Family Medicine Teaching Service Daily Progress Note Intern Pager: [phone]  Patient name: Charles Fox Medical record number: [id number] Date of birth: May 31, 1951 Age: 70 y.o. Gender: male  Primary Care Provider: [name], MD Consultants: Psychiatry (s/o), Urology (s/o) Code Status: FULL  [name]:  8/19: Admitted for altered mental status and hematuria 8/21: MoCA 12/30, determined to not have capacity 8/22: Discussion with son revealed patient does not have a safe place for discharge 10/20: New guardian: [name] 10/21: Guardianship hearing, granted guardianship over person (not estate) 10/25: Informed by SW that APS worker applying for medicaid   Assessment and Plan: NAOD SWEETLAND is a 70 y.o. male who was admitted in August for altered mental status and urinary difficulty. He has improved from the urinary standpoint. He remains hospitalized because he lacks capacity and medical decision making capabilities and is currently awaiting court date for guardian of estate.  He does not have safe disposition at this time.  Housing insecurity  barriers to disposition Appreciate CSW's help in assisting with placement.  Patient has a court date on 11/30 to establish guardian of estate.  He continues to have visitor restrictions due to an APS investigation for concern of mismanagement of finances by family.  Dementia: Chronic, stable He did have an incident yesterday where he attempted to elope. Two security guards were called to the room. He was eventually redirectable. No additional overnight events.  -Continue Seroquel 50 mg nightly, Celexa 10 mg daily, melatonin 5 mg nightly  History of urinary retention: Stable Has been stable from the standpoint for a while. -Continue finasteride 5 mg daily and Flomax 0.8 mg daily  Hypertension: Chronic, stable Continue losartan 50 mg daily and Coreg 3.125 mg twice daily  Type 2 DM: Chronic,  stable -Continue Metformin 500 mg BID  -Continue Atorvastatin 40 mg daily   FEN/GI: Regular diet PPx: SCDs Dispo:SNF  pending court date on 11/30 to determine guardian of estate .  Subjective:  Patient sleeping comfortably.  Objective: Temp:  [98 F (36.7 C)-98.3 F (36.8 C)] 98 F (36.7 C) (11/23 1956) Pulse Rate:  [79-80] 80 (11/23 1956) Resp:  [16-18] 18 (11/23 1956) BP: (124-148)/(76-78) 148/76 (11/23 1956) SpO2:  [99 %-100 %] 100 % (11/23 1956) Weight:  [104.1 kg] 104.1 kg (11/24 0326) Physical Exam: General: Elderly man sleeping comfortably in bed, in NAD Cardiovascular: RRR Respiratory: Normal work of breathing, clear fields anteriorly  Abdomen: normal active bowel sounds, non-distended, soft  Laboratory: No results for input(s): WBC, HGB, HCT, PLT in the last 168 hours. No results for input(s): NA, K, CL, CO2, BUN, CREATININE, CALCIUM, PROT, BILITOT, ALKPHOS, ALT, AST, GLUCOSE in the last 168 hours.  Invalid input(s): LABALBU  Imaging/Diagnostic Tests: No results found.   Sharion Settler, DO 08/12/2021, 5:25 AM PGY-2, [place] Intern pager: [phone], text pages welcome

## 2021-08-11 NOTE — Progress Notes (Signed)
FPTS Brief Note Reviewed patient's vitals, recent notes.  Vitals:   08/11/21 0730 08/11/21 1956  BP: 124/78 (!) 148/76  Pulse: 79 80  Resp: 16 18  Temp: 98.3 F (36.8 C) 98 F (36.7 C)  SpO2: 99% 100%   At this time, no change in plan from day progress note.  Sharion Settler, DO Page [phone] with questions about this patient.

## 2021-08-11 NOTE — Progress Notes (Signed)
FPTS Interim Progress Note  S:Called to nurse room as the patient was up and irritated trying to leave the unit, security had been called. When I arrived, patient had 2 security guards nearby and patient was okay with walking back to the room with me. He stated that he was really wanting to go outside and smoke as he feels "cooped up" here and that he just wants to get out. He wants to go back to his old church and talk with his old pastor [name]. He does not want a nicotine patch and is adamant about getting out to smoke. Patient wants to leave now.  He does not have capacity and is currently under guardianship per court orders, he does not have the ability to make his medical decisions. We will contact the attending and social worker to see what is able to be done for the patient.  Rise Patience, DO 08/11/2021, 3:17 PM PGY-2, [place] Service pager [phone]

## 2021-08-11 NOTE — Progress Notes (Signed)
CSW was notified that patient was attempting to leave - CSW provided Agricultural consultant with patient's legal guardian's contact information.  Devoria Albe of [place] [employer] can be reached at [phone].  [name], MSW, LCSW Transitions of Care  Clinical Social Worker II [phone]

## 2021-08-12 DIAGNOSIS — F322 Major depressive disorder, single episode, severe without psychotic features: Secondary | ICD-10-CM | POA: Diagnosis not present

## 2021-08-12 NOTE — Progress Notes (Signed)
Patient attempted to leave the unit. States that he was "going for a smoke". Attempted redirection. Patient states "you aren't big enough to stop me". Another RN approached to assist. Both RN's able to redirect patient back to room. Explained non-smoking campus to patient but he lacks understanding. Offered nicotine gum but patient refused. States "that doesn't work". RN positioned charting station within line of sight of room.

## 2021-08-13 DIAGNOSIS — R4182 Altered mental status, unspecified: Secondary | ICD-10-CM | POA: Diagnosis not present

## 2021-08-13 MED ORDER — ONDANSETRON 4 MG PO TBDP
4.0000 mg | ORAL_TABLET | Freq: Every day | ORAL | Status: DC | PRN
  Administered 2021-08-13 – 2021-08-16 (×2): 4 mg via ORAL
  Filled 2021-08-13 (×2): qty 1

## 2021-08-13 NOTE — Progress Notes (Signed)
Family Medicine Teaching Service Daily Progress Note Intern Pager: [phone]  Patient name: Charles Fox Medical record number: [id number] Date of birth: [birth year] Age: 70 y.o. Gender: male  Primary Care Provider: [name], MD Consultants: Psychiatry (s/o), Urology (s/o) Code Status: Full  [name]:  8/19: Admitted for altered mental status and hematuria 8/20: Normal brain MRI and improved mentation  8/21: MoCA 12/30, determined to not have capacity 8/22: Discussion with son revealed patient does not have a safe place for discharge 8/23: PSA elevated.  Urology recommending keeping catheter and rechecking PSA in 1 month. 8/24: Voiding trial 8/25: Suicide precautions, voiding trial failed, Foley reinserted 8/26: Suicide precautions removed 8/29 agitated and combative, delirium precautions in place 9/5: AKI 9/9: AKI resolved 9/18: Foley removed 9/30: reported SI, placed on suicide precautions with sitter 10/1: suicide precautions removed 10/3: Self reported fall 10/20: New guardian: [name] 10/21: Guardianship hearing, granted guardianship over person (not estate) 10/25: Informed by SW that APS worker applying for medicaid  11/6: Suicide precautions, MoCA 8/30 11/9: Suicide precautions removed 11/19: Contacted liaison for [place] [employer] 11/22-visited by [facility]  Assessment and Plan: Charles Fox is a 70 y.o. male who was admitted in August for AMS and urinary difficulty. He remains hospitalized due to lack of capacity, medical decision making and has been medically stable awaiting placement.  He is currently awaiting court date for establishment of guardian of estate.  However, he is currently medically stable for discharge.  Dementia Chronic, stable.  Patient has been adamant however last 48 hours that he wants to leave.  Security guards have been temporarily used for redirection.  Patient was redirected today by  myself. -Continue Seroquel 50 mg nightly, Celexa 10 mg daily, melatonin 5 mg nightly -Consider Haldol 5 mg and psychiatry consult as needed if patient becomes harm to himself or others  Housing insecurity  disposition difficulty CSW assisting with placement.  APS investigation continuing for concern of mismanagement of finances by family.  Patient has court date on 11/30 to establish guardian of estate.  Visitor restrictions continue to be in place.  All other chronic medical conditions have been medically managed as appropriate (urinary retention, HTN, T2DM).  FEN/GI: Regular PPx: SCDs Dispo:SNF pending court date 11/30 to determine guardian of estate.   Subjective:  Patient states he just wants to leave.  He feels like he is suffocating being kept in the hospital.  He says he wants to walk outside but as soon as we turn her backs, he is running for.  Does not state any intention of wanting to hurt anybody.  Objective: Temp:  [97.6 F (36.4 C)-98.3 F (36.8 C)] 98.3 F (36.8 C) (11/24 2013) Pulse Rate:  [71-93] 93 (11/24 2013) Resp:  [18-19] 19 (11/24 2013) BP: (118-143)/(69-75) 118/69 (11/24 2013) SpO2:  [97 %] 97 % (11/24 2013) Weight:  [104 kg] 104 kg (11/25 0500) Physical Exam: General: WDWN, NAD, calm and appropriate in conversation, redirectable Cardiovascular: RRR, no murmurs auscultated Respiratory: CTAB, no increased work of breathing  Laboratory: No results for input(s): WBC, HGB, HCT, PLT in the last 168 hours. No results for input(s): NA, K, CL, CO2, BUN, CREATININE, CALCIUM, PROT, BILITOT, ALKPHOS, ALT, AST, GLUCOSE in the last 168 hours.  Invalid input(s): LABALBU  Imaging/Diagnostic Tests: No results found.  [name], DO 08/13/2021, 9:31 AM PGY-1, [place] Intern pager: [phone], text pages welcome

## 2021-08-14 DIAGNOSIS — Z658 Other specified problems related to psychosocial circumstances: Secondary | ICD-10-CM | POA: Diagnosis not present

## 2021-08-14 DIAGNOSIS — G309 Alzheimer's disease, unspecified: Secondary | ICD-10-CM | POA: Diagnosis not present

## 2021-08-14 DIAGNOSIS — E1149 Type 2 diabetes mellitus with other diabetic neurological complication: Secondary | ICD-10-CM | POA: Diagnosis not present

## 2021-08-14 DIAGNOSIS — F02C2 Dementia in other diseases classified elsewhere, severe, with psychotic disturbance: Secondary | ICD-10-CM

## 2021-08-14 DIAGNOSIS — F322 Major depressive disorder, single episode, severe without psychotic features: Secondary | ICD-10-CM | POA: Diagnosis not present

## 2021-08-14 NOTE — Progress Notes (Signed)
FPTS Brief Note Reviewed patient's vitals, recent notes.  Vitals:   08/13/21 0941 08/13/21 1911  BP: 117/82 (!) 176/77  Pulse: 83 98  Resp: 17 18  Temp: 98.1 F (36.7 C) 97.6 F (36.4 C)  SpO2: 94% 96%   Had episode of nausea without vomiting. Gave Zofran.   At this time, no additional change in plan from day progress note.  Sharion Settler, DO Page [phone] with questions about this patient.

## 2021-08-14 NOTE — Progress Notes (Signed)
CSW will attend court on the patient's behalf on 08/18/21 where a permanent guardian of the person and of the estate will be established. No updates available regarding placement at this time. Patient is not allowed to receive any visitors at this time due to restrictions put in place by his interim guardian at [place].  [name], MSW, LCSW Transitions of Care  Clinical Social Worker II [phone]

## 2021-08-14 NOTE — Progress Notes (Signed)
Family Medicine Teaching Service Daily Progress Note Intern Pager: [phone]  Patient name: Charles Fox Medical record number: [id number] Date of birth: 1950/12/01 Age: 70 y.o. Gender: male  Primary Care Provider: [name], MD Consultants: Psychiatry (s/o), Urology (s/o) Code Status: Full Code   [name]:  8/19: Admitted for altered mental status and hematuria 8/20: Normal brain MRI and improved mentation  8/21: MoCA 12/30, determined to not have capacity 8/22: Discussion with son revealed patient does not have a safe place for discharge 8/23: PSA elevated.  Urology recommending keeping catheter and rechecking PSA in 1 month. 8/24: Voiding trial 8/25: Suicide precautions, voiding trial failed, Foley reinserted 8/26: Suicide precautions removed 8/29 agitated and combative, delirium precautions in place 9/5: AKI 9/9: AKI resolved 9/18: Foley removed 9/30: reported SI, placed on suicide precautions with sitter 10/1: suicide precautions removed 10/3: Self reported fall 10/20: New guardian: Charles Fox 10/21: Guardianship hearing, granted guardianship over person (not estate) 10/25: Informed by SW that APS worker applying for medicaid  11/6: Suicide precautions, MoCA 8/30 11/9: Suicide precautions removed 11/19: Contacted liaison for [employer] [place] [employer] 11/22-visited by [employer]   Assessment and Plan: Charles Fox is a 70 y.o. male who was admitted in August for AMS and urinary difficulty. He remains hospitalized due to lack of capacity, medical decision making and has been medically stable awaiting placement.  He is currently awaiting court date for establishment of guardian of estate.  However, he is currently medically stable for discharge.  Dementia, chronic and stable No acute events overnight, he did endorse some nausea that was relieved with one-time dose of Zofran.  Patient was sleeping soundly during encounter, did not awaken  patient. Seroquel 50 mg qHS Celexa 10 mg daily Consider Haldol 5 mg and psychiatry consult as needed if patient endorses SI or HI  Housing insecurity  disposition difficulty CSW assisting with placement, which is much appreciated.  Continuing APS investigation for concern of mismanagement of finances by family.  He has a court date on 11/30 to establish guardian of state. Continue visitor restrictions  Chronic and stable conditions: Urinary retention, HTN, T2DM.  FEN/GI: Regular  PPx: SCDs Start: 05/07/21 2310 Dispo: SNF pending court date 11/30 to determine guardian of state    Subjective:  [name] this AM was seen sleeping soundly.  Did not awaken patient.  Objective: Temp:  [97.6 F (36.4 C)-98.1 F (36.7 C)] 97.6 F (36.4 C) (11/25 1911) Pulse Rate:  [83-98] 98 (11/25 1911) Resp:  [17-18] 18 (11/25 1911) BP: (117-176)/(77-82) 176/77 (11/25 1911) SpO2:  [94 %-96 %] 96 % (11/25 1911) Weight:  [98.7 kg] 98.7 kg (11/26 0356) Physical Exam Vitals and nursing note reviewed.  Constitutional:      General: He is not in acute distress.    Appearance: He is not diaphoretic.     Comments: Sleeping soundly  HENT:     Head: Normocephalic.  Cardiovascular:     Rate and Rhythm: Normal rate and regular rhythm.  Pulmonary:     Effort: Pulmonary effort is normal. No respiratory distress.    Laboratory: No results for input(s): WBC, HGB, HCT, PLT in the last 168 hours. No results for input(s): NA, K, CL, CO2, BUN, CREATININE, CALCIUM, PROT, BILITOT, ALKPHOS, ALT, AST, GLUCOSE in the last 168 hours.  Invalid input(s): LABALBU No results found for this or any previous visit (from the past 24 hour(s)).  Imaging/Diagnostic Tests: No results found.   Charles Fox  Charles Spruce, DO 08/14/2021, 6:19 AM PGY-1, [place] Intern pager: [phone], text pages welcome

## 2021-08-15 DIAGNOSIS — F322 Major depressive disorder, single episode, severe without psychotic features: Secondary | ICD-10-CM | POA: Diagnosis not present

## 2021-08-15 NOTE — Progress Notes (Signed)
FPTS Brief Note Reviewed patient's vitals, recent notes.  Vitals:   08/15/21 0835 08/15/21 1934  BP: (!) 153/80 (!) 146/76  Pulse: 84 91  Resp: 18 18  Temp: 97.6 F (36.4 C) 97.8 F (36.6 C)  SpO2: 99% 96%   At this time, no change in plan from day progress note.  Sharion Settler, DO Page [phone] with questions about this patient.

## 2021-08-15 NOTE — Progress Notes (Signed)
Family Medicine Teaching Service Daily Progress Note Intern Pager: [phone]  Patient name: Charles Fox Medical record number: [id number] Date of birth: 01/25/1951 Age: 70 y.o. Gender: male  Primary Care Provider: [name], MD Consultants: [facility]  Code Status: Full Code   [name]:  8/19: Admitted for altered mental status and hematuria 8/20: Normal brain MRI and improved mentation  8/21: MoCA 12/30, determined to not have capacity 8/22: Discussion with son revealed patient does not have a safe place for discharge 8/23: PSA elevated.  Urology recommending keeping catheter and rechecking PSA in 1 month. 8/24: Voiding trial 8/25: Suicide precautions, voiding trial failed, Foley reinserted 8/26: Suicide precautions removed 8/29 agitated and combative, delirium precautions in place 9/5: AKI 9/9: AKI resolved 9/18: Foley removed 9/30: reported SI, placed on suicide precautions with sitter 10/1: suicide precautions removed 10/3: Self reported fall 10/20: New guardian: [name] 10/21: Guardianship hearing, granted guardianship over person (not estate) 10/25: Informed by SW that APS worker applying for medicaid  11/6: Suicide precautions, MoCA 8/30 11/9: Suicide precautions removed 11/19: Contacted liaison for [facility] [employer] 11/22-visited by [facility]  Assessment and Plan: Charles Fox is a 70 y.o. male who was admitted in August for AMS and urinary difficulty.   Dementia, chronic and stable No acute events overnight, he did endorse some nausea that was relieved with one-time dose of Zofran.  Patient was sleeping soundly during encounter, did not awaken patient. -Seroquel 50 mg qHS -Celexa 10 mg daily -Consider Haldol 5 mg and psychiatry consult as needed if patient endorses SI or HI   Housing insecurity  disposition difficulty CSW assisting with placement, which is much appreciated.  Continuing APS investigation for concern of  mismanagement of finances by family.  He has a court date on 11/30 to establish guardian of state. - visitor restrictions for patient safety concerns    Chronic and stable conditions: Urinary retention, HTN, T2DM.   FEN/GI: Regular  PPx: SCDs  Dispo: SNF pending court date 11/30 to place permanent guardianship   Subjective:  Patient denies any pain or complaints this AM.   Objective: Temp:  [98.1 F (36.7 C)] 98.1 F (36.7 C) (11/26 1944) Pulse Rate:  [86-93] 93 (11/26 1944) Resp:  [19] 19 (11/26 1944) BP: (144-160)/(67-89) 144/67 (11/26 1944) SpO2:  [96 %] 96 % (11/26 1944) Weight:  [107.1 kg] 107.1 kg (11/27 0500)  Physical Exam: General: elderly male in NAD lying supine in bed  Cardiovascular: RRR  Respiratory: CTAB, no wheezing, stable on RA  Abdomen: soft, NT, bowel sounds present  Extremities: no LE edema present   Imaging/Diagnostic Tests: No new imaging   Eulis Foster, MD 08/15/2021, 6:28 AM PGY-3, [place] Intern pager: [phone], text pages welcome

## 2021-08-15 NOTE — Progress Notes (Signed)
FPTS Brief Note Reviewed patient's vitals, recent notes.  Vitals:   08/14/21 0834 08/14/21 1944  BP: (!) 160/89 (!) 144/67  Pulse: 86 93  Resp: 19 19  Temp: 98.1 F (36.7 C) 98.1 F (36.7 C)  SpO2: 96% 96%   At this time, no change in plan from day progress note.  Ezequiel Essex, MD Page [phone] with questions about this patient.

## 2021-08-16 DIAGNOSIS — F322 Major depressive disorder, single episode, severe without psychotic features: Secondary | ICD-10-CM | POA: Diagnosis not present

## 2021-08-16 NOTE — Progress Notes (Addendum)
1pm: CSW spoke with patient's legal guardian who states court is scheduled for 08/18/21 at 2pm. Lanae is planning to visit patient tomorrow around 11am.  8am: CSW attempted to reach patient's guardian ad litem [name] without success - a voicemail was left requesting a return call.  [name], MSW, LCSW Transitions of Care  Clinical Social Worker II [phone]

## 2021-08-16 NOTE — Progress Notes (Signed)
Family Medicine Teaching Service Daily Progress Note Intern Pager: [phone]  Patient name: DUSAN LIPFORD Medical record number: [id number] Date of birth: [birth year] Age: 70 y.o. Gender: male  Primary Care Provider: [name], MD Consultants: [facility] Code Status: Full Code   [name]:  8/19: Admitted for altered mental status and hematuria 8/20: Normal brain MRI and improved mentation  8/21: MoCA 12/30, determined to not have capacity 8/22: Discussion with son revealed patient does not have a safe place for discharge 8/23: PSA elevated.  Urology recommending keeping catheter and rechecking PSA in 1 month. 8/24: Voiding trial 8/25: Suicide precautions, voiding trial failed, Foley reinserted 8/26: Suicide precautions removed 8/29 agitated and combative, delirium precautions in place 9/5: AKI 9/9: AKI resolved 9/18: Foley removed 9/30: reported SI, placed on suicide precautions with sitter 10/1: suicide precautions removed 10/3: Self reported fall 10/20: New guardian: [name] 10/21: Guardianship hearing, granted guardianship over person (not estate) 10/25: Informed by SW that APS worker applying for medicaid  11/6: Suicide precautions, MoCA 8/30 11/9: Suicide precautions removed 11/19: Contacted liaison for [facility] [employer] 11/22: Visited by [facility] 12/09: CBC, CMP, A1c, PSA?, lipid panel? (LDL 111) due  Assessment and Plan: SORIN FRIMPONG is a 70 y.o. male presented with in August for AMS and urinary difficulty. PMHx of T2DM, HTN, HLD, dementia, CVA, housing insecurity.  Dementia, chronic and stable No acute events overnight and no complaints today. Seroquel 50 mg nightly Celexa 10 mg daily Consider Haldol 5 mg for acute delirium agitation and psychiatry consult PRN if patient endorses SI/HI  Housing insecurity  disposition difficulty CSW assisting with placement, which is much appreciated.  Continuing APS investigation for concern  of mismanagement of finances by family.  Chronic and stable conditions:  Urinary retention  BPH: Finasteride 5 mg daily, tamsulosin 0.8 mg daily HTN: SBP 140s-150s, DBP 70s-80s.  Losartan 50 mg daily Hx of CVA: Coreg 3.125 mg BID T2DM: A1c 8.9, CBG 165 (IP CBG goal <180).  metformin 500 mg twice daily HLD: Atorvastatin 40 mg daily Constipation: Last BM 11/27.  MiraLAX daily, Senokot BID  FEN/GI: Regular  PPx: SCDs Start: 05/07/21 2310 Dispo:SNF pending court date 11/30 to place permanent guardianship   Subjective:  Mr. Chandlor this AM was seen lying in bed comfortably. Stated that he feels well aside from a mild frontal headache that is improving spontaneously.  Otherwise he had no acute concerns.  Objective: Temp:  [97.6 F (36.4 C)-97.8 F (36.6 C)] 97.6 F (36.4 C) (11/28 0726) Pulse Rate:  [69-91] 69 (11/28 0726) Resp:  [18-19] 19 (11/28 0726) BP: (140-146)/(76-81) 140/81 (11/28 0726) SpO2:  [96 %-97 %] 97 % (11/28 0726) Weight:  [106.3 kg] 106.3 kg (11/28 0500) Physical Exam Vitals and nursing note reviewed.  Constitutional:      General: He is not in acute distress.    Appearance: He is not ill-appearing, toxic-appearing or diaphoretic.  HENT:     Head: Normocephalic and atraumatic.  Cardiovascular:     Rate and Rhythm: Normal rate and regular rhythm.     Heart sounds: No murmur heard.   No friction rub. No gallop.  Pulmonary:     Effort: Pulmonary effort is normal. No respiratory distress.     Breath sounds: Normal breath sounds. No wheezing or rales.  Abdominal:     General: There is no distension.     Palpations: Abdomen is soft.     Tenderness: There is no abdominal  tenderness. There is no guarding or rebound.  Musculoskeletal:     Right lower leg: No edema.     Left lower leg: No edema.  Neurological:     Mental Status: He is alert.     Laboratory: No results for input(s): WBC, HGB, HCT, PLT in the last 168 hours. No results for input(s): NA, K, CL,  CO2, BUN, CREATININE, CALCIUM, PROT, BILITOT, ALKPHOS, ALT, AST, GLUCOSE in the last 168 hours.  Invalid input(s): LABALBU No results found for this or any previous visit (from the past 24 hour(s)).  Imaging/Diagnostic Tests: No results found.   [name], DO 08/16/2021, 9:23 AM PGY-1, [place] Intern pager: [phone], text pages welcome

## 2021-08-16 NOTE — Progress Notes (Signed)
Family Medicine Teaching Service Daily Progress Note Intern Pager: [phone]  Patient name: Charles Fox Medical record number: [id number] Date of birth: [birth year] Age: 70 y.o. Gender: male  Primary Care Provider: [name], MD Consultants: [facility] Code Status: Full Code   [name]:  8/19: Admitted for altered mental status and hematuria 8/20: Normal brain MRI and improved mentation  8/21: MoCA 12/30, determined to not have capacity 8/22: Discussion with son revealed patient does not have a safe place for discharge 8/23: PSA elevated.  Urology recommending keeping catheter and rechecking PSA in 1 month. 8/24: Voiding trial 8/25: Suicide precautions, voiding trial failed, Foley reinserted 8/26: Suicide precautions removed 8/29 agitated and combative, delirium precautions in place 9/5: AKI 9/9: AKI resolved 9/18: Foley removed 9/30: reported SI, placed on suicide precautions with sitter 10/1: suicide precautions removed 10/3: Self reported fall 10/20: New guardian: [name] 10/21: Guardianship hearing, granted guardianship over person (not estate) 10/25: Informed by SW that APS worker applying for medicaid  11/6: Suicide precautions, MoCA 8/30 11/9: Suicide precautions removed 11/19: Contacted liaison for [facility] [employer] 11/22: Visited by [facility] 12/09: CBC, CMP, A1c, PSA?, lipid panel? (LDL 111) due  Assessment and Plan: Charles Fox is a 70 y.o. male presented with in August for AMS and urinary difficulty. PMHx of T2DM, HTN, HLD, dementia, CVA, housing insecurity.  Dementia, chronic and stable No acute events overnight and no complaints today. Seroquel 50 mg nightly Celexa 10 mg daily Consider Haldol 5 mg for acute delirium agitation and psychiatry consult PRN if patient endorses SI/HI  Housing insecurity  disposition difficulty CSW assisting with placement, which is much appreciated.  Continuing APS investigation for concern  of mismanagement of finances by family.  Chronic and stable conditions:  BPH  Hx of urinary retention: Finasteride 5 mg daily, tamsulosin 0.8 mg daily.  HTN: SBP 140s-150s, DBP 70s-80s.  Losartan 50 mg daily Hx of CVA: Coreg 3.125 mg BID T2DM: A1c 8.9, CBG 165 (IP CBG goal <180).  metformin 500 mg twice daily HLD: Atorvastatin 40 mg daily Constipation: Last BM 11/27. MiraLAX daily, Senokot BID  FEN/GI: Regular  PPx: None Dispo:SNF pending court date 11/30 to place permanent guardianship   Subjective:  Charles Fox this AM was seen lying in bed comfortably. Stated that he feels well aside from a mild frontal headache that is improving spontaneously. Otherwise he had no acute concerns.  Objective: Temp:  [97.6 F (36.4 C)-97.8 F (36.6 C)] 97.6 F (36.4 C) (11/28 0726) Pulse Rate:  [69-91] 69 (11/28 0726) Resp:  [18-19] 19 (11/28 0726) BP: (140-146)/(76-81) 140/81 (11/28 0726) SpO2:  [96 %-97 %] 97 % (11/28 0726) Weight:  [234 lb 5.6 oz (106.3 kg)] 234 lb 5.6 oz (106.3 kg) (11/28 0500) Physical Exam Vitals and nursing note reviewed.  Constitutional:      General: He is not in acute distress.    Appearance: He is not ill-appearing, toxic-appearing or diaphoretic.  HENT:     Head: Normocephalic and atraumatic.  Cardiovascular:     Rate and Rhythm: Normal rate and regular rhythm.     Heart sounds: No murmur heard.   No friction rub. No gallop.  Pulmonary:     Effort: Pulmonary effort is normal. No respiratory distress.     Breath sounds: Normal breath sounds. No wheezing or rales.  Abdominal:     General: There is no distension.     Palpations: Abdomen is soft.  Tenderness: There is no abdominal tenderness. There is no guarding or rebound.  Musculoskeletal:     Right lower leg: No edema.     Left lower leg: No edema.  Neurological:     Mental Status: He is alert.     Laboratory: No results for input(s): WBC, HGB, HCT, PLT in the last 168 hours. No results for  input(s): NA, K, CL, CO2, BUN, CREATININE, CALCIUM, PROT, BILITOT, ALKPHOS, ALT, AST, GLUCOSE in the last 168 hours.  Invalid input(s): LABALBU No results found for this or any previous visit (from the past 24 hour(s)).  Imaging/Diagnostic Tests: No results found.   [name], DO 08/16/2021, 12:07 PM PGY-1, [place] Intern pager: [phone], text pages welcome

## 2021-08-16 NOTE — Progress Notes (Signed)
FPTS Brief Note Reviewed patient's vitals, recent notes.  Vitals:   08/16/21 0726 08/16/21 2026  BP: 140/81 (!) 158/69  Pulse: 69   Resp: 19   Temp: 97.6 F (36.4 C) 97.7 F (36.5 C)  SpO2: 97% 98%   At this time, no change in plan from day progress note.  Sharion Settler, DO Page [phone] with questions about this patient.

## 2021-08-17 DIAGNOSIS — F322 Major depressive disorder, single episode, severe without psychotic features: Secondary | ICD-10-CM | POA: Diagnosis not present

## 2021-08-17 MED ORDER — CITALOPRAM HYDROBROMIDE 20 MG PO TABS
20.0000 mg | ORAL_TABLET | Freq: Every day | ORAL | Status: DC
  Administered 2021-08-18 – 2021-09-24 (×38): 20 mg via ORAL
  Filled 2021-08-17 (×38): qty 1

## 2021-08-17 NOTE — Progress Notes (Signed)
Family Medicine Teaching Service Daily Progress Note Intern Pager: [phone]  Patient name: Charles Fox Medical record number: [id number] Date of birth: [birth year] Age: 70 y.o. Gender: male  Primary Care Provider: [name], MD Consultants: [facility] Code Status: Full Code   [name]:  8/19: Admitted for altered mental status and hematuria 8/20: Normal brain MRI and improved mentation  8/21: MoCA 12/30, determined to not have capacity 8/22: Discussion with son revealed patient does not have a safe place for discharge 8/23: PSA elevated.  Urology recommending keeping catheter and rechecking PSA in 1 month. 8/24: Voiding trial 8/25: Suicide precautions, voiding trial failed, Foley reinserted 8/26: Suicide precautions removed 8/29 agitated and combative, delirium precautions in place 9/5: AKI 9/9: AKI resolved 9/18: Foley removed 9/30: reported SI, placed on suicide precautions with sitter 10/1: suicide precautions removed 10/3: Self reported fall 10/20: New guardian: Charles Fox 10/21: Guardianship hearing, granted guardianship over person (not estate) 10/25: Informed by SW that APS worker applying for medicaid  11/6: Suicide precautions, MoCA 8/30 11/9: Suicide precautions removed 11/19: Contacted liaison for [place] [employer] 11/22: Visited by [facility] [employer] 11/30: Court date for guardianship of estate 12/09: CBC, CMP, A1c, PSA?, lipid panel? (LDL 111) due   Assessment and Plan: Charles Fox is a 70 y.o. male presented with in August for AMS and urinary difficulty. PMHx of T2DM, HTN, HLD, dementia, CVA, housing insecurity. Patient still lacks capacity, is at cognitive baseline, still lacks the ability to manage his own affairs including healthcare and financial decisions. Patient is medically stable, now awaiting placement. Currently pending court date for granting patient guardianship of his estate.  Dementia, chronic and stable  MDD No acute  events overnight. Consider haldol 5 mg for acute delirium agitation and psychiatry consult PRN if patient endorses SI/HI.  Will increase Celexa 10 mg daily to 20 mg. Seroquel 50 mg qHS Celexa 20 mg daily Melatonin 5 mg qHS  Housing insecurity  disposition difficulty CSW assisting with placement, greatly appreciated. Continuing APS investigation for concern of mismanagement of finances by family.   Chronic and stable conditions: BPH  hx of urinary retention: Tamsulosin 0.8 mg daily, finasteride 5 mg daily HTN: SBP 140s-160s DBP 70s-80s. Losartan 50 mg daily. Will consider increasing losartan to 75 mg daily.  T2DM: A1c 8.9. Metformin 500 mg twice daily. HLD: Atorvastatin 40 mg daily Hx of CVA: Carvedilol 3.125 mg BID Constipation: Last BM 11/27. Senna 1 tablet twice daily, MiraLAX daily  FEN/GI: Regular  PPx: None Dispo:SNF pending court date after 11/30 for guardianship of his estate.  Subjective:  Charles Fox this AM was seen laying in bed. Reported no acute concerns. However did request someone to shave his face.  Objective: Temp:  [97.7 F (36.5 C)-97.9 F (36.6 C)] 97.9 F (36.6 C) (11/29 0742) Pulse Rate:  [69] 69 (11/29 0742) Resp:  [17] 17 (11/29 0742) BP: (158-161)/(69-74) 161/74 (11/29 0742) SpO2:  [96 %-98 %] 96 % (11/29 0742) Physical Exam Vitals and nursing note reviewed.  Constitutional:      General: He is not in acute distress.    Appearance: He is not ill-appearing, toxic-appearing or diaphoretic.  HENT:     Head: Normocephalic and atraumatic.  Eyes:     Extraocular Movements: Extraocular movements intact.     Pupils: Pupils are equal, round, and reactive to light.  Cardiovascular:     Rate and Rhythm: Normal rate and regular rhythm.     Heart sounds:  No murmur heard.   No friction rub. No gallop.  Pulmonary:     Effort: Pulmonary effort is normal. No respiratory distress.     Breath sounds: Normal breath sounds. No wheezing or rales.  Abdominal:      General: There is no distension.     Palpations: Abdomen is soft.     Tenderness: There is no abdominal tenderness. There is no guarding or rebound.  Musculoskeletal:     Right lower leg: No edema.     Left lower leg: No edema.  Skin:    General: Skin is warm and dry.  Neurological:     Mental Status: He is alert. Mental status is at baseline.  Psychiatric:     Comments: Mood and affect depressed, stoic     Laboratory: No results for input(s): WBC, HGB, HCT, PLT in the last 168 hours. No results for input(s): NA, K, CL, CO2, BUN, CREATININE, CALCIUM, PROT, BILITOT, ALKPHOS, ALT, AST, GLUCOSE in the last 168 hours.  Invalid input(s): LABALBU No results found for this or any previous visit (from the past 24 hour(s)).  Imaging/Diagnostic Tests: No results found.   Charles Brittle, DO 08/17/2021, 12:46 PM PGY-1, [place] Intern pager: [phone], text pages welcome

## 2021-08-17 NOTE — Plan of Care (Signed)

## 2021-08-17 NOTE — Progress Notes (Signed)
Family Medicine Teaching Service Daily Progress Note Intern Pager: [phone]  Patient name: Charles Fox Medical record number: [id number] Date of birth: 1951-09-13 Age: 70 y.o. Gender: male  Primary Care Provider: [name], MD Consultants: [facility] Code Status: Full Code   [name]:  8/19: Admitted for altered mental status and hematuria 8/20: Normal brain MRI and improved mentation  8/21: MoCA 12/30, determined to not have capacity 8/22: Discussion with son revealed patient does not have a safe place for discharge 8/23: PSA elevated.  Urology recommending keeping catheter and rechecking PSA in 1 month. 8/24: Voiding trial 8/25: Suicide precautions, voiding trial failed, Foley reinserted 8/26: Suicide precautions removed 8/29 agitated and combative, delirium precautions in place 9/5: AKI 9/9: AKI resolved 9/18: Foley removed 9/30: reported SI, placed on suicide precautions with sitter 10/1: suicide precautions removed 10/3: Self reported fall 10/20: New guardian: [name] 10/21: Guardianship hearing, granted guardianship over person (not estate) 10/25: Informed by SW that APS worker applying for medicaid  11/6: Suicide precautions, MoCA 8/30 11/9: Suicide precautions removed 11/19: Contacted liaison for [place] [employer] 11/22: Visited by [facility] 11/30: Court date for guardianship of estate 12/09: Monthly labs due: CBC, CMP, A1c, PSA?, lipid panel? (LDL 111)  Assessment and Plan: Charles Fox is a 70 y.o. male presented with Hematuria and Altered Mental Status in August. PMHx of T2DM, HTN, HLD, dementia, hx of CVA, housing and security.  Patient still lacks capacity, is at cognitive baseline, still lacks the ability to manage his own affairs including healthcare and financial decisions. Patient is medically stable now awaiting placement. Patient's court date for estate guardianship is today.  Dementia, chronic and stable  MDD No  acute events overnight.  We will consider Haldol 5 mg as needed for acute delirium agitation and psychiatry consult if patient endorses SI/HI. Seroquel 50 mg nightly Celexa 20 mg daily Melatonin 5 mg nightly  Housing insecurity  disposition difficulty CSW assisting with placement, greatly appreciated.  Continuing APS investigation for concern of financial mismanagement by family.  Chronic unstable conditions: BPH  history of urinary retention: Tamsulosin 0.8 mg daily, finasteride 5 mg daily HTN: Losartan 50 mg daily T2DM: A1c 8.9.  Metformin 500 mg BID  History of CVA: Carvedilol 3.125 mg twice daily Constipation: Senna 1 tablet twice daily, MiraLAX daily  FEN/GI: Regular  PPx: None Dispo:SNF pending court process for guardianship of his estate.  Subjective:  Smaran was seen this AM, stated that he feels well, enjoying his haircut, and that he has not acute concerns.  Objective: BP (!) 161/74 (BP Location: Right Arm)   Pulse 69   Temp 97.9 F (36.6 C) (Oral)   Resp 17   Ht 5\' 8"  (1.727 m)   Wt 106.3 kg   SpO2 96%   BMI 35.63 kg/m   Physical Exam Vitals and nursing note reviewed.  HENT:     Head: Normocephalic and atraumatic.  Cardiovascular:     Rate and Rhythm: Normal rate and regular rhythm.     Heart sounds: No murmur heard.   No friction rub. No gallop.  Pulmonary:     Effort: Pulmonary effort is normal. No respiratory distress.     Breath sounds: Normal breath sounds. No wheezing or rales.  Abdominal:     Palpations: Abdomen is soft.     Tenderness: There is no abdominal tenderness.  Skin:    General: Skin is warm and dry.  Neurological:  Mental Status: He is alert. Mental status is at baseline.     Laboratory: No results for input(s): WBC, HGB, HCT, PLT in the last 168 hours. No results for input(s): NA, K, CL, CO2, BUN, CREATININE, CALCIUM, PROT, BILITOT, ALKPHOS, ALT, AST, GLUCOSE in the last 168 hours.  Invalid input(s): LABALBU No results found  for this or any previous visit (from the past 24 hour(s)).  Imaging/Diagnostic Tests: No results found.   [name], DO 08/18/2021, 11:34 AM PGY-1, [place] Intern pager: [phone], text pages welcome

## 2021-08-17 NOTE — Progress Notes (Addendum)
3pm: CSW spoke with patient's GAL Bethany to discuss the court hearing scheduled for tomorrow.  2pm: CSW spoke with Devoria Albe on unit to discuss patient and upcoming court hearing. CSW will assist patient with participating in the hearing tomorrow.  CSW received call from [place] [facility] who states Pablo, the director is coming to visit patient for an assessment.   12pm: CSW spoke with Dr. Harless Nakayama to request an updated note regarding patient's lack of capacity. CSW will send note to Jed Limerick, [facility] attorney once it is available.  [name], MSW, LCSW Transitions of Care  Clinical Social Worker II [phone]

## 2021-08-18 DIAGNOSIS — F322 Major depressive disorder, single episode, severe without psychotic features: Secondary | ICD-10-CM | POA: Diagnosis not present

## 2021-08-18 NOTE — Progress Notes (Signed)
FPTS Brief Note Reviewed patient's vitals, recent notes.  Vitals:   08/17/21 0742 08/18/21 2203  BP: (!) 161/74 135/74  Pulse: 69 85  Resp: 17 18  Temp: 97.9 F (36.6 C) 97.9 F (36.6 C)  SpO2: 96% 98%   At this time, no change in plan from day progress note.  Sharion Settler, DO Page [phone] with questions about this patient.

## 2021-08-18 NOTE — Progress Notes (Addendum)
Family Medicine Teaching Service Daily Progress Note Intern Pager: [phone]  Patient name: Charles Fox Medical record number: [id number] Date of birth: 06/06/1951 Age: 70 y.o. Gender: male  Primary Care Provider: [name], MD Consultants: [facility] Code Status: Full Code   [name]:  8/19: Admitted for altered mental status and hematuria 8/20: Normal brain MRI and improved mentation  8/21: MoCA 12/30, determined to not have capacity 8/22: Discussion with son revealed patient does not have a safe place for discharge 8/23: PSA elevated.  Urology recommending keeping catheter and rechecking PSA in 1 month. 8/24: Voiding trial 8/25: Suicide precautions, voiding trial failed, Foley reinserted 8/26: Suicide precautions removed 8/29 agitated and combative, delirium precautions in place 9/5: AKI 9/9: AKI resolved 9/18: Foley removed 9/30: reported SI, placed on suicide precautions with sitter 10/1: suicide precautions removed 10/3: Self reported fall 10/20: New guardian: [name] 10/21: Guardianship hearing, granted guardianship over person (not estate) 10/25: Informed by SW that APS worker applying for medicaid  11/3: Flu vaxx with guardian consent  11/6: Suicide precautions, MoCA 8/30 11/9: Suicide precautions removed 11/19: Contacted liaison for [place] [employer] 11/22: Visited by [place] [employer] 11/30: Court date for guardianship of estate 12/1: COVID (Pfizer)-vaxx with guardian consent 12/09: Monthly labs due: CBC, CMP, A1c, PSA?, lipid panel? (LDL 111)  Assessment and Plan: Charles Fox is a 70 y.o. male presented with Hematuria and Altered Mental Status in August. PMHx of T2DM, HTN, HLD, dementia, hx of CVA, housing and security. Patient still lacks capacity, is at cognitive baseline, still lacks the ability to manage his own affairs including healthcare and financial decisions. Patient is medically stable now awaiting placement.  DSS was  appointed for guardianship of the patient's and Charles Fox was granted guardianship of his estate (11/30).  Dementia, chronic and stable  MDD  AMS, resolved No acute events overnight. We will consider Haldol 5 mg as needed for acute delirium agitation and psychiatry consult if patient endorses SI/HI. He requested a cigarette due to feeling anxious, because celexa was increased 2 days ago, will continue to monitor and allow medicine to work. Seroquel 50 mg nightly Celexa 20 mg daily Melatonin 5 mg nightly  Housing insecurity  disposition difficulty CSW assisting with placement, greatly appreciated.  Continuing APS investigation for concern of financial mismanagement by family.  Chronic & stable conditions: BPH  hx urinary retention: Tamsulosin 0.8 mg daily, finasteride 5 mg daily HTN: Losartan 50 mg daily T2DM: A1c 8.9.  Metformin 500 mg BID  History of CVA: Carvedilol 3.125 mg twice daily Constipation: Senna 1 tablet twice daily, MiraLAX daily  FEN/GI: Regular  PPx:   None Dispo:SNF, pending placement  Subjective:  Charles Fox was seen this AM. Stated that he feels alright and had no acute complaints.   Objective: Temp:  [97.9 F (36.6 C)] 97.9 F (36.6 C) (11/30 2203) Pulse Rate:  [85] 85 (11/30 2203) Resp:  [18] 18 (11/30 2203) BP: (135)/(74) 135/74 (11/30 2203) SpO2:  [98 %] 98 % (11/30 2203) Physical Exam Vitals and nursing note reviewed.  Constitutional:      General: He is not in acute distress.    Appearance: He is not ill-appearing, toxic-appearing or diaphoretic.  HENT:     Head: Normocephalic and atraumatic.  Cardiovascular:     Rate and Rhythm: Normal rate and regular rhythm.     Heart sounds: No murmur heard.   No friction rub. No gallop.  Pulmonary:  Effort: Pulmonary effort is normal. No respiratory distress.     Breath sounds: Normal breath sounds. No wheezing or rales.  Abdominal:     Palpations: Abdomen is soft.     Tenderness: There is no  abdominal tenderness. There is no guarding.  Musculoskeletal:     Right lower leg: No edema.     Left lower leg: No edema.  Skin:    General: Skin is warm.  Neurological:     Mental Status: He is alert. Mental status is at baseline.     Laboratory: No results for input(s): WBC, HGB, HCT, PLT in the last 168 hours. No results for input(s): NA, K, CL, CO2, BUN, CREATININE, CALCIUM, PROT, BILITOT, ALKPHOS, ALT, AST, GLUCOSE in the last 168 hours.  Invalid input(s): LABALBU No results found for this or any previous visit (from the past 24 hour(s)).  Imaging/Diagnostic Tests: No results found.   [name], DO 08/19/2021, 7:05 AM PGY-1, [place] Intern pager: [phone], text pages welcome

## 2021-08-18 NOTE — Progress Notes (Signed)
CSW assisted patient with his participation in the court hearing. DSS was appointed full guardianship of the patient and [name] was granted guardianship of his estate.  [name], MSW, LCSW Transitions of Care  Clinical Social Worker II [phone]

## 2021-08-18 NOTE — Progress Notes (Signed)
FPTS Brief Note Reviewed patient's vitals, recent notes.  Vitals:   08/16/21 2026 08/17/21 0742  BP: (!) 158/69 (!) 161/74  Pulse:  69  Resp:  17  Temp: 97.7 F (36.5 C) 97.9 F (36.6 C)  SpO2: 98% 96%   At this time, no change in plan from day progress note.  [name], DO Page [phone] with questions about this patient.

## 2021-08-19 DIAGNOSIS — F322 Major depressive disorder, single episode, severe without psychotic features: Secondary | ICD-10-CM | POA: Diagnosis not present

## 2021-08-19 LAB — GLUCOSE, CAPILLARY: Glucose-Capillary: 128 mg/dL — ABNORMAL HIGH (ref 70–99)

## 2021-08-19 MED ORDER — ZOSTER VAC RECOMB ADJUVANTED 50 MCG/0.5ML IM SUSR: 0.5000 mL | Freq: Once | INTRAMUSCULAR | Status: DC

## 2021-08-19 MED ORDER — COVID-19MRNA BIVAL VACC PFIZER 30 MCG/0.3ML IM SUSP
0.3000 mL | Freq: Once | INTRAMUSCULAR | Status: AC
  Administered 2021-08-19: 0.3 mL via INTRAMUSCULAR
  Filled 2021-08-19: qty 0.3

## 2021-08-19 MED ORDER — INFLUENZA VAC A&B SA ADJ QUAD 0.5 ML IM PRSY: 0.5000 mL | PREFILLED_SYRINGE | Freq: Once | INTRAMUSCULAR | Status: DC

## 2021-08-19 NOTE — Progress Notes (Signed)
CSW provided Dr. Alfonse Spruce with Devoria Albe at [facility] APS contact information to discuss consents for vaccines.  [name], MSW, LCSW Transitions of Care  Clinical Social Worker II [phone]

## 2021-08-19 NOTE — Progress Notes (Signed)
Pt stated he felt like his blood glucose was low. Checked cbg, it was 128.
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
## 2021-08-20 DIAGNOSIS — F322 Major depressive disorder, single episode, severe without psychotic features: Secondary | ICD-10-CM | POA: Diagnosis not present

## 2021-08-20 MED ORDER — SENNA 8.6 MG PO TABS
1.0000 | ORAL_TABLET | Freq: Every day | ORAL | Status: DC
  Administered 2021-08-21 – 2021-09-24 (×35): 8.6 mg via ORAL
  Filled 2021-08-20 (×35): qty 1

## 2021-08-20 NOTE — Progress Notes (Signed)
FPTS Brief Note Reviewed patient's vitals, recent notes.  Vitals:   08/19/21 2343 08/20/21 0748  BP: 132/75 131/62  Pulse: 75 71  Resp: 14 17  Temp: 97.7 F (36.5 C) 97.8 F (36.6 C)  SpO2: 94% 99%   At this time, no change in plan from day progress note.    Alcus Dad, MD Page [phone] with questions about this patient.

## 2021-08-20 NOTE — Progress Notes (Signed)
FPTS Brief Note Reviewed patient's vitals, recent notes.  Vitals:   08/19/21 0805 08/19/21 2343  BP: 128/66 132/75  Pulse: 72 75  Resp: 16 14  Temp: 98 F (36.7 C) 97.7 F (36.5 C)  SpO2: 97% 94%   At this time, no change in plan from day progress note.    Alcus Dad, MD Page [phone] with questions about this patient.
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
## 2021-08-22 NOTE — Progress Notes (Signed)
FPTS Interim Night Progress Note  S:Patient sleeping comfortably.  Rounded with primary night RN.  No concerns voiced.  No orders required.    O: Today's Vitals   08/21/21 0044 08/21/21 0809 08/21/21 1153 08/21/21 2100  BP: (!) 141/66  137/60   Pulse: 80  76   Resp: 16  16   Temp: (!) 97.5 F (36.4 C)  97.6 F (36.4 C)   TempSrc: Oral  Oral   SpO2: 97%  100%   Weight:      Height:      PainSc:  0-No pain  0-No pain      A/P: Continue current management  Carollee Leitz MD PGY-3, [place] Medicine Service pager [phone]

## 2021-08-23 DIAGNOSIS — F322 Major depressive disorder, single episode, severe without psychotic features: Secondary | ICD-10-CM | POA: Diagnosis not present
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
## 2021-08-23 NOTE — Progress Notes (Signed)
FPTS Brief Note Reviewed patient's vitals, recent notes.  Vitals:   08/23/21 1146 08/23/21 1955  BP: (!) 144/68 127/71  Pulse: 69 70  Resp: 16 16  Temp: 97.7 F (36.5 C) 98 F (36.7 C)  SpO2:  98%   At this time, no change in plan from day progress note.   Alcus Dad, MD Page [phone] with questions about this patient.

## 2021-08-23 NOTE — Progress Notes (Signed)
FPTS Brief Note Reviewed patient's vitals, recent notes.  Vitals:   08/21/21 1153 08/22/21 1211  BP: 137/60 125/68  Pulse: 76 77  Resp: 16 16  Temp: 97.6 F (36.4 C) 97.6 F (36.4 C)  SpO2: 100% 98%   At this time, no change in plan from day progress note.   Alcus Dad, MD Page [phone] with questions about this patient.

## 2021-08-24 DIAGNOSIS — F322 Major depressive disorder, single episode, severe without psychotic features: Secondary | ICD-10-CM | POA: Diagnosis not present

## 2021-08-24 NOTE — Progress Notes (Signed)
CSW spoke with APS social worker Talbert Forest who states the patient's income is over the limit for Medicaid. CSW will speak with patient's legal guardian Lanae to discuss information.  [name], MSW, LCSW Transitions of Care  Clinical Social Worker II [phone]
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
## 2021-08-24 NOTE — Progress Notes (Signed)
FPTS Brief Note Reviewed patient's vitals, recent notes.  Vitals:   08/23/21 1955 08/24/21 2007  BP: 127/71 137/82  Pulse: 70 80  Resp: 16 18  Temp: 98 F (36.7 C) 98 F (36.7 C)  SpO2: 98% 98%   At this time, no change in plan from day progress note.   Alcus Dad, MD Page [phone] with questions about this patient.

## 2021-08-25 DIAGNOSIS — F322 Major depressive disorder, single episode, severe without psychotic features: Secondary | ICD-10-CM | POA: Diagnosis not present

## 2021-08-25 MED ORDER — TUBERCULIN PPD 5 UNIT/0.1ML ID SOLN
5.0000 [IU] | Freq: Once | INTRADERMAL | Status: AC
  Administered 2021-08-25: 5 [IU] via INTRADERMAL
  Filled 2021-08-25: qty 0.1

## 2021-08-25 NOTE — Progress Notes (Signed)
Family Medicine Teaching Service Daily Progress Note Intern Pager: [phone]  Patient name: Charles Fox Medical record number: [id number] Date of birth: October 25, 1950 Age: 70 y.o. Gender: male  Primary Care Provider: [name], MD Consultants: Psych Code Status: Full Code   [name]:  8/19: Admitted for altered mental status and hematuria 8/20: Normal brain MRI and improved mentation  8/21: MoCA 12/30, determined to not have capacity 8/22: Discussion with son revealed patient does not have a safe place for discharge 8/23: PSA elevated.  Urology recommending keeping catheter and rechecking PSA in 1 month. 8/24: Voiding trial 8/25: Suicide precautions, voiding trial failed, Foley reinserted 8/26: Suicide precautions removed 8/29 agitated and combative, delirium precautions in place 9/5: AKI 9/9: AKI resolved 9/18: Foley removed 9/30: reported SI, placed on suicide precautions with sitter 10/1: suicide precautions removed 10/3: Self reported fall 10/20: New guardian: [name] 10/21: Guardianship hearing, granted guardianship over person (not estate) 10/25: Informed by SW that APS worker applying for medicaid  11/3: Flu vaxx with guardian consent  11/6: Suicide precautions, MoCA 8/30 11/9: Suicide precautions removed 11/19: Contacted liaison for [employer] 11/22: Visited by [employer] 11/30: Court date for guardianship of estate 12/1: COVID (Pfizer)-vaxx with guardian consent  Assessment and Plan: CARSEN LEAF is a 70 y.o. male presented with Hematuria and Altered Mental Status in August. PMHx of T2DM, HTN, HLD, dementia, hx of CVA, housing and security.   Difficult Placement  Housing Insecurity  Patient lacks capacity, is at cognitive baseline, still lacks ability to manage his own affairs including healthcare and financial decisions. DSS was appointed for guardianship of the patient and on 11/30 was granted guardianship of his  estate which is needed to determine payment for SNF. Patient is medically STABLE and READY FOR DISCHARGE.  Requires TB skin test patient has been in high risk hospital setting prior to DC. Appreciate CSW/CM for assistance with disposition No visitors due to concern for familial financial misuse Follow-up TB skin test   Dementia  MDD  Seroquel 50 mg nightly Celexa 20 mg daily Melatonin 5 mg nightly   FEN/GI: Regular  PPx:   None Dispo: SNF. Patient is medically STABLE and READY FOR DISCHARGE.   Subjective:  Carlin was seen this AM. Stated that he feels fine and is ready to go.  Objective: Temp:  [98 F (36.7 C)-98.3 F (36.8 C)] 98.3 F (36.8 C) (12/07 0807) Pulse Rate:  [72-80] 72 (12/07 0807) Resp:  [18] 18 (12/07 0807) BP: (137-160)/(79-82) 160/79 (12/07 0807) SpO2:  [98 %] 98 % (12/07 5638) Physical Exam Vitals and nursing note reviewed.  Constitutional:      General: He is not in acute distress.    Appearance: He is not ill-appearing, toxic-appearing or diaphoretic.  HENT:     Head: Normocephalic and atraumatic.  Cardiovascular:     Rate and Rhythm: Normal rate and regular rhythm.     Heart sounds: No murmur heard.   No friction rub. No gallop.  Pulmonary:     Effort: Pulmonary effort is normal. No respiratory distress.     Breath sounds: Normal breath sounds. No wheezing or rales.  Abdominal:     General: There is no distension.     Palpations: Abdomen is soft.  Skin:    General: Skin is warm and dry.  Neurological:     Mental Status: He is alert.     Laboratory: No results for input(s): WBC, HGB, HCT,  PLT in the last 168 hours. No results for input(s): NA, K, CL, CO2, BUN, CREATININE, CALCIUM, PROT, BILITOT, ALKPHOS, ALT, AST, GLUCOSE in the last 168 hours.  Invalid input(s): LABALBU No results found for this or any previous visit (from the past 24 hour(s)).  Imaging/Diagnostic Tests: No results found.   [name], DO 08/25/2021, 11:33 AM PGY-1,  [facility] Intern pager: [phone], text pages welcome

## 2021-08-25 NOTE — Progress Notes (Signed)
FPTS Brief Note Reviewed patient's vitals, recent notes.  Vitals:   08/25/21 0807 08/25/21 1942  BP: (!) 160/79 134/80  Pulse: 72   Resp: 18 18  Temp: 98.3 F (36.8 C) 98.3 F (36.8 C)  SpO2: 98%    At this time, no change in plan from day progress note. Awaiting possible placement.   Alcus Dad, MD Page [phone] with questions about this patient.

## 2021-08-25 NOTE — Progress Notes (Addendum)
CSW spoke with Estill Bamberg at [facility] to answer questions regarding patient's abilities to complete his ADL's independently.   CSW attempted to reach Gaylyn Lambert, attorney and guardian of estate - left message with Network engineer requesting a return call.  CSW attempted to reach Devoria Albe without success - a voicemail was left requesting a return call.  [name], MSW, LCSW Transitions of Care  Clinical Social Worker II [phone]

## 2021-08-26 DIAGNOSIS — F322 Major depressive disorder, single episode, severe without psychotic features: Secondary | ICD-10-CM | POA: Diagnosis not present

## 2021-08-26 NOTE — Progress Notes (Signed)
FPTS Brief Note Reviewed patient's vitals, recent notes.  Vitals:   08/26/21 0752 08/26/21 1943  BP: (!) 145/75 (!) 152/78  Pulse: 69 74  Resp: 19 17  Temp: 98 F (36.7 C) 98 F (36.7 C)  SpO2: 97% 98%   At this time, no change in plan from day progress note.   Alcus Dad, MD Page [phone] with questions about this patient.

## 2021-08-26 NOTE — Progress Notes (Signed)
Family Medicine Teaching Service Daily Progress Note Intern Pager: [phone]  Patient name: Charles Fox Medical record number: [id number] Date of birth: December 10, 1950 Age: 70 y.o. Gender: male  Primary Care Provider: [name], MD Consultants: psych Code Status: Full Code   [name]:  8/19: Admitted for altered mental status and hematuria 8/20: Normal brain MRI and improved mentation  8/21: MoCA 12/30, determined to not have capacity 8/22: Discussion with son revealed patient does not have a safe place for discharge 8/23: PSA elevated.  Urology recommending keeping catheter and rechecking PSA in 1 month. 8/24: Voiding trial 8/25: Suicide precautions, voiding trial failed, Foley reinserted 8/26: Suicide precautions removed 8/29 agitated and combative, delirium precautions in place 9/5: AKI 9/9: AKI resolved 9/18: Foley removed 9/30: reported SI, placed on suicide precautions with sitter 10/1: suicide precautions removed 10/3: Self reported fall 10/20: New guardian: Charles Fox 10/21: Guardianship hearing, granted guardianship over person (not estate) 10/25: Informed by SW that APS worker applying for medicaid  11/3: Flu vaxx with guardian consent  11/6: Suicide precautions, MoCA 8/30 11/9: Suicide precautions removed 11/19: Contacted liaison for Charles Fox 11/22: Visited by Charles Fox 11/30: Court date for guardianship of estate 12/1: COVID (Pfizer)-vaxx with guardian consent 12/7: TB skin test  Assessment and Plan: Charles Fox is a 70 y.o. male presented with Hematuria and Altered Mental Status in August. PMHx of T2DM, HTN, HLD, dementia, hx of CVA, housing and security.   Difficult Placement  Housing Insecurity  Patient lacks capacity, is at cognitive baseline, still lacks ability to manage his own affairs including healthcare and financial decisions. DSS was appointed for guardianship of the patient and on 11/30 was granted  guardianship of his estate which is needed to determine payment for SNF. Patient is medically STABLE and READY FOR DISCHARGE.  Requires TB skin test patient has been in high risk hospital setting prior to DC. Appreciate CSW/CM for assistance with disposition No visitors due to concern for familial financial misuse Follow-up TB skin test   Dementia  MDD  Seroquel 50 mg nightly Celexa 20 mg daily Melatonin 5 mg nightly  FEN/GI: Regular  PPx:   None Dispo: SNF Barriers include NONE. Patient is medically STABLE and READY FOR DISCHARGE.   Subjective:  Charles Fox was seen this AM. Stated that he is ready to leave, otherwise has no other acute concerns.  Objective: Temp:  [98.3 F (36.8 C)] 98.3 F (36.8 C) (12/07 1942) Pulse Rate:  [72] 72 (12/07 0807) Resp:  [18] 18 (12/07 1942) BP: (134-160)/(79-80) 134/80 (12/07 1942) SpO2:  [98 %] 98 % (12/07 0086) Physical Exam Vitals and nursing note reviewed.  Constitutional:      General: He is not in acute distress.    Appearance: He is not ill-appearing, toxic-appearing or diaphoretic.  HENT:     Head: Normocephalic and atraumatic.  Cardiovascular:     Rate and Rhythm: Normal rate and regular rhythm.     Heart sounds: Normal heart sounds. No murmur heard.   No friction rub. No gallop.  Pulmonary:     Effort: Pulmonary effort is normal. No respiratory distress.     Breath sounds: Normal breath sounds. No wheezing or rhonchi.  Abdominal:     Palpations: Abdomen is soft.  Skin:    General: Skin is warm and dry.  Neurological:     Mental Status: Mental status is at baseline.     Laboratory: No results for input(s): WBC,  HGB, HCT, PLT in the last 168 hours. No results for input(s): NA, K, CL, CO2, BUN, CREATININE, CALCIUM, PROT, BILITOT, ALKPHOS, ALT, AST, GLUCOSE in the last 168 hours.  Invalid input(s): LABALBU No results found for this or any previous visit (from the past 24 hour(s)).  Imaging/Diagnostic Tests: No results found.    Charles Brittle, DO 08/26/2021, 7:10 AM PGY-1, [place] Intern pager: [phone], text pages welcome

## 2021-08-27 DIAGNOSIS — F322 Major depressive disorder, single episode, severe without psychotic features: Secondary | ICD-10-CM | POA: Diagnosis not present

## 2021-08-27 NOTE — Progress Notes (Signed)
No assessed elevation of skin PPD TB testing site.  0 mm.  Will page FMTS on call, and continue to monitor patient.

## 2021-08-27 NOTE — Progress Notes (Addendum)
Family Medicine Teaching Service Daily Progress Note Intern Pager: [phone]  Patient name: Charles Fox Medical record number: [id number] Date of birth: December 14, 1950 Age: 70 y.o. Gender: male  Primary Care Provider: [name], MD Consultants: Psychiatry Code Status: Full  [name]:  8/19: Admitted for altered mental status and hematuria 8/20: Normal brain MRI and improved mentation  8/21: MoCA 12/30, determined to not have capacity 8/22: Discussion with son revealed patient does not have a safe place for discharge 8/23: PSA elevated.  Urology recommending keeping catheter and rechecking PSA in 1 month. 8/24: Voiding trial 8/25: Suicide precautions, voiding trial failed, Foley reinserted 8/26: Suicide precautions removed 8/29 agitated and combative, delirium precautions in place 9/5: AKI 9/9: AKI resolved 9/18: Foley removed 9/30: reported SI, placed on suicide precautions with sitter 10/1: suicide precautions removed 10/3: Self reported fall 10/20: New guardian: Charles Fox 10/21: Guardianship hearing, granted guardianship over person (not estate) 10/25: Informed by SW that APS worker applying for medicaid  11/3: Flu vaxx with guardian consent  11/6: Suicide precautions, MoCA 8/30 11/9: Suicide precautions removed 11/19: Contacted liaison for [employer] 11/22: Visited by [employer] 11/30: Court date for guardianship of estate 12/1: COVID (Pfizer)-vaxx with guardian consent 12/7: TB skin test  Assessment and Plan: Charles Fox is a 70 y.o. male presented with Hematuria and Altered Mental Status in August. PMHx of T2DM, HTN, HLD, dementia, hx of CVA, housing and security.    Difficult Placement  Housing Insecurity  Patient lacks capacity, is at cognitive baseline, still lacks ability to manage his own affairs including healthcare and financial decisions. DSS was appointed for guardianship of the patient and on 11/30 was granted  guardianship of his estate which is needed to determine payment for SNF. Patient is medically STABLE and READY FOR DISCHARGE.  There are a few outstanding guardianship items being completed before [name] can discharge to [facility].  His TB skin test was read as negative yesterday. -CSW/CM coordinating estate management -Hopeful for discharge soon  Dementia  MDD - Continue Seroquel 50mg  QHS - Celexa 20mg  daily - Melatonin 5mg  QHS  FEN/GI: Regular PPx: None Dispo:[facility]. Barriers include NONE. Patient is medically STABLE and READY FOR DISCHARGE.    Subjective:  My. Fox has no acute concerns this morning.   Objective: Temp:  [97.9 F (36.6 C)] 97.9 F (36.6 C) (12/09 0719) Pulse Rate:  [73] 73 (12/09 0719) Resp:  [18] 18 (12/09 0719) BP: (141)/(86) 141/86 (12/09 0719) SpO2:  [100 %] 100 % (12/09 0719) Physical Exam: General: Sleeping on arrival, wakes easily to voice, NAD Cardiovascular: RRR, no murmur Respiratory: Normal WOB on RA Abdomen: Soft, non-tender Extremities: Without edema or deformity  Laboratory: No results for input(s): WBC, HGB, HCT, PLT in the last 168 hours. No results for input(s): NA, K, CL, CO2, BUN, CREATININE, CALCIUM, PROT, BILITOT, ALKPHOS, ALT, AST, GLUCOSE in the last 168 hours.  Invalid input(s): LABALBU   Imaging/Diagnostic Tests: None  Eppie Gibson, MD 08/28/2021, 12:55 AM PGY-1, [place] Intern pager: [phone], text pages welcome  I have interviewed and examined the patient.  I have discussed the case with Dr. Joelyn Oms.   I agree with their documentation and management in their note for today.  Patient is medically stable for discharge   LOS: 109 days  For questions or updates, please contact Family Medicine Teaching Service Resident On-call at pager [phone] or  [email or web address] - see pager "[place]"

## 2021-08-27 NOTE — Discharge Summary (Addendum)
[facility] Discharge Summary  Patient name: Charles Fox Medical record number: [id number] Date of birth: 1951-05-13 Age: 70 y.o. Gender: male Date of Admission: 05/07/2021  Date of Discharge: 09/24/2021 Admitting Physician: Lind Covert, MD  Primary Care Provider: [name], MD Consultants: Psychiatry  Indication for Hospitalization:  Altered mental status and hematuria  Discharge Diagnoses/Problem List:  Principal Problem:   Major depressive disorder, single episode, severe ([place]) Active Problems:   Type 2 diabetes mellitus with neurological complications ([place])   Morbid obesity ([place])   Hypertension associated with diabetes ([place])   Hyperlipidemia associated with type 2 diabetes mellitus ([place])   Dementia ([place])   AMS (altered mental status)   Suicidal ideation   Hematuria   AKI (acute kidney injury) ([place])   Bladder outlet obstruction   Inadequate social support   Disposition:  [place]  Discharge Condition:  Stable   Discharge Exam:  Temp:  [98 F (36.7 C)] 98 F (36.7 C) (01/05 2039) Pulse Rate:  [72] 72 (01/05 2039) Resp:  [15] 15 (01/05 2039) BP: (159)/(79) 159/79 (01/05 2039) SpO2:  [99 %] 99 % (01/05 2039)  General: Well appearing, NAD, awake, alert, responsive to questions, mood appropriate and pleasant Head: Normocephalic atraumatic CV: Regular rate and rhythm no murmurs rubs or gallops Respiratory: Clear to ausculation bilaterally, no wheezes rales or crackles, chest rises symmetrically,  no increased work of breathing Abdomen: Soft, non-tender, non-distended Extremities: Moves upper and lower extremities freely, no edema in LE Neuro: No focal deficits Skin: No rashes or lesions visualized   Brief Hospital Course:  [name] is a 70 year old man with a history of T2DM, HTN, HLD, CVA, housing insecurity, tobacco use disorder who was admitted for altered mental status (AMS) and hematuria.   Plan by  Problem: AMS  Worsening dementia/Alzheimer's  hospital delirium  MDD + transient SI Patient presented with altered mental state and uncooperative behavior.  CT head and MRI brain negative.  Hypoglycemia, trauma, intracranial process is ruled out.  Infectious cause and electrolyte abnormalities ruled out as well.  Patient was noted to be diagnosed with dementia previously and continued hospital course revealed patient's memory is compromised.  He is able to ambulate appropriately.  Scored 12/30 on MoCA and deemed not to have capacity.  Patient did have temporary time periods of agitation that required medical management using Seroquel.  Patient also had moments of endorsing suicidal ideation and placed on suicide precautions with 1: 1 sitter.  Each time this was mentioned by patient, psychiatry was consulted and suicidal ideation was denied by patient.  Believe this to be caused by patient's worsened cognitive impairment as he temporarily recalls poor outcomes regarding family in the past that do not involve current moments in life. At time of discharge he remained alert, not oriented, did not have capacity to make medical decisions, but had no SI/HI. Of note, patient had to be reminded frequently to drink water.  Citalopram 20 mg daily Seroquel 75 mg qHS Melatonin 5 mg nightly  Hematuria  urinary retention Patient was seen in the ED on 8/15 for urinary retention and had foley catheter placed and then was discharged with referral.  Patient unable to follow-up due to dementia and lacking the executive function capabilities to follow up. But returned to ED on 8/19 with hematuria.  UA showed amber color urine with large hemoglobin, RBC> 50, few bacteria with WBC 21-50, trace LE, nitrite negative.  UTI and cystitis ruled out.  CT renal study showed bilateral hydronephrosis without stones. PSA elevated at 46, started on Flomax 0.4 mg daily.  Urology advised continuous urinary catheterization and Flomax with  PSA recheck in 1 month.  Did not advise further imaging or labs.  Patient failed first voiding trial and had to have Foley catheter replaced. Catheter to be in place and follow up with urology outpatient for voiding trial. Of note, CT stone study from 8/15 found that "the posterior aspect of the bladder is thickened, suspicious for underlying neoplasm." Patient requires urology follow up to evaluate persistent hematuria and possible bladder malignancy. Patient had a repeat PSA preformed on 9/23 while in the hospital at 4.8. Patient has been without foley. May be due to addition of Flomax and Finasteride. Tamsulosin 0.8 mg daily Finasteride 5 mg daily  Capacity Patient still lacks capacity, he is at cognitive baseline, still lacks the ability to manage his own affairs including healthcare and financial decisions. DSS was appointed for guardianship of the patient and on 11/30 was granted guardianship of his estate which is needed to determine payment for SNF.  10/20: New guardian: [place] maintenance 11/3: Flu vaxx with guardian consent  12/1: COVID (Pfizer)-vaxx with guardian consent 12/7: TB skin test negative  Chronic & stable conditions:  Urinary retention  BPH: Finasteride 5 mg daily, tamsulosin 0.8 mg daily HTN: SBP 140s-150s, DBP 70s-80s. Losartan 75 mg daily Hx of CVA: Coreg 3.125 mg BID, ASA 81 mg daily T2DM: A1c 6.7.  Metformin 500 mg twice daily HLD: Atorvastatin 40 mg daily Constipation:  MiraLAX daily, Senokot BID  Issues for Follow Up:  Attempted to give shingles vaccine, however not available in the hospital. Flu vax 11/3, Covid-Pfizer 12/1 Consider outpatient neurology referral for dementia Concern for bladder malignancy: Needs urology follow up for potential malignancy and hematuria. PSA on 06/11/21 was 4.79. Chronic conditions: HLD/CAD/hx of CVA, T2DM, HTN.  On atorvastatin 40 mg, carvedilol 3.125 mg BID, losartan 75 mg, metformin 500 mg twice daily. Patient  will need annual imaging of thoracic ascending aorta (4.2 cm)    Significant Labs and Imaging:  No results for input(s): WBC, HGB, HCT, PLT in the last 168 hours. Recent Labs  Lab 09/21/21 0112 09/21/21 1045  NA 133* 136  K 4.2 4.5  CL 100 101  CO2 26 27  GLUCOSE 195* 155*  BUN 18 15  CREATININE 0.87 0.97  CALCIUM 8.9 9.1   Results for orders placed or performed during the hospital encounter of 05/07/21 (from the past 24 hour(s))  SARS CORONAVIRUS 2 (TAT 6-24 HRS) Nasopharyngeal Nasopharyngeal Swab     Status: None   Collection Time: 09/23/21 12:10 PM   Specimen: Nasopharyngeal Swab  Result Value Ref Range   SARS Coronavirus 2 NEGATIVE NEGATIVE    CT Angio Chest Pulmonary Embolism (PE) W or WO Contrast  Result Date: 09/07/2021 CLINICAL DATA:  Chest wall pain in a 70 year old male. EXAM: CT ANGIOGRAPHY CHEST WITH CONTRAST TECHNIQUE: Multidetector CT imaging of the chest was performed using the standard protocol during bolus administration of intravenous contrast. Multiplanar CT image reconstructions and MIPs were obtained to evaluate the vascular anatomy. CONTRAST:  149m OMNIPAQUE IOHEXOL 350 MG/ML SOLN COMPARISON:  Chest x-ray September 06, 2021 FINDINGS: Cardiovascular: Aortic atherosclerosis. 4.2 cm caliber of the ascending thoracic aorta. No stranding adjacent to the aorta. Signs of bovine arch. No pericardial effusion. Heart size accentuated by low lung volumes. Main pulmonary arterial caliber is normal. Opacification of the main pulmonary artery at  289 Hounsfield units adequate for assessment but mildly limited. Assessment further limited by presence of respiratory motion. Adequate evaluation to the proximal segmental level without signs of pulmonary embolism. Mediastinum/Nodes: No thoracic inlet, axillary, mediastinal or hilar adenopathy. Esophagus grossly normal. Lungs/Pleura: Basilar atelectasis. No effusion. No pneumothorax. No consolidation. Signs of prior granulomatous disease  with calcified granulomata in the RIGHT upper and middle lobe. Upper Abdomen: Incidental imaging of upper abdominal contents without acute process. Musculoskeletal: No acute bone finding. No destructive bone process. Spinal degenerative changes. Review of the MIP images confirms the above findings. IMPRESSION: No evidence of pulmonary embolism to the proximal segmental level. Mild of limited assessment due to bolus timing and presence of respiratory motion. No acute cardiopulmonary process identified. Signs of prior granulomatous disease. Aortic atherosclerosis. 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 Recommend annual imaging followup by CTA or MRA. This recommendation follows 2010 ACCF/AHA/AATS/ACR/ASA/SCA/SCAI/SIR/STS/SVM Guidelines for the Diagnosis and Management of Patients with Thoracic Aortic Disease. Circulation. 2010; 121: X094-M768. Aortic aneurysm NOS (ICD10-I71.9) Electronically Signed   By: Zetta Bills M.D.   On: 09/07/2021 14:20   DG Chest Port 1 View  Result Date: 09/06/2021 CLINICAL DATA:  Chest pain EXAM: PORTABLE CHEST 1 VIEW COMPARISON:  05/07/2021 FINDINGS: Cardiac shadow is enlarged but stable. Aortic calcifications are noted. Lungs are well aerated bilaterally. No bony abnormality is noted. IMPRESSION: No active disease. Electronically Signed   By: Inez Catalina M.D.   On: 09/06/2021 01:03   ECHOCARDIOGRAM COMPLETE  Result Date: 09/06/2021    ECHOCARDIOGRAM REPORT   Patient Name:   JAKOBY MELENDREZ Date of Exam: 09/06/2021 Medical Rec #:  [id number]       Height:       68.0 in Accession #:    [id number]      Weight:       234.3 lb Date of Birth:  [birth year]      BSA:          2.186 m Patient Age:    41 years        BP:           113/64 mmHg Patient Gender: M               HR:           73 bpm. Exam Location:  Inpatient Procedure: 2D Echo, Cardiac Doppler and Color Doppler Indications:    R07.9* Chest pain,  unspecified  History:        Patient has no prior history of Echocardiogram examinations.                 Signs/Symptoms:Chest Pain, Alzheimer's and Altered Mental                 Status; Risk Factors:Diabetes, Hypertension, Dyslipidemia and                 Current Smoker.  Sonographer:    Roseanna Rainbow RDCS Referring Phys: [id number] CARINA M BROWN  Sonographer Comments: Technically difficult study due to poor echo windows. Image acquisition challenging due to patient body habitus. IMPRESSIONS  1. Left ventricular ejection fraction, by estimation, is 60 to 65%. The left ventricle has normal function. The left ventricle has no regional wall motion abnormalities. There is mild left ventricular hypertrophy. Left ventricular diastolic parameters were normal.  2. Right ventricular systolic function is normal. The right ventricular size is normal. There is normal pulmonary artery systolic  pressure. The estimated right ventricular systolic pressure is 85.8 mmHg.  3. The mitral valve is normal in structure. No evidence of mitral valve regurgitation. No evidence of mitral stenosis.  4. The aortic valve is tricuspid. Aortic valve regurgitation is trivial. Aortic valve sclerosis/calcification is present, without any evidence of aortic stenosis.  5. Aortic dilatation noted. 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  6. The inferior vena cava is normal in size with greater than 50% respiratory variability, suggesting right atrial pressure of 3 mmHg. FINDINGS  Left Ventricle: Left ventricular ejection fraction, by estimation, is 60 to 65%. The left ventricle has normal function. The left ventricle has no regional wall motion abnormalities. The left ventricular internal cavity size was normal in size. There is  mild left ventricular hypertrophy. Left ventricular diastolic parameters were normal. Right Ventricle: The right ventricular size is normal. No  increase in right ventricular wall thickness. Right ventricular systolic function is normal. There is normal pulmonary artery systolic pressure. The tricuspid regurgitant velocity is 2.41 m/s, and  with an assumed right atrial pressure of 3 mmHg, the estimated right ventricular systolic pressure is 85.0 mmHg. Left Atrium: Left atrial size was normal in size. Right Atrium: Right atrial size was normal in size. Pericardium: There is no evidence of pericardial effusion. Mitral Valve: The mitral valve is normal in structure. No evidence of mitral valve regurgitation. No evidence of mitral valve stenosis. Tricuspid Valve: The tricuspid valve is normal in structure. Tricuspid valve regurgitation is trivial. Aortic Valve: The aortic valve is tricuspid. Aortic valve regurgitation is trivial. Aortic valve sclerosis/calcification is present, without any evidence of aortic stenosis. Pulmonic Valve: The pulmonic valve was not well visualized. Pulmonic valve regurgitation is not visualized. Aorta: Aortic dilatation noted. 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 Venous: The inferior vena cava is normal in size with greater than 50% respiratory variability, suggesting right atrial pressure of 3 mmHg. IAS/Shunts: The interatrial septum was not well visualized.  LEFT VENTRICLE PLAX 2D LVIDd:         4.20 cm      Diastology LVIDs:         2.90 cm      LV e' medial:    9.14 cm/s LV PW:         1.10 cm      LV E/e' medial:  8.9 LV IVS:        1.20 cm      LV e' lateral:   10.00 cm/s LVOT diam:     2.30 cm      LV E/e' lateral: 8.1 LV SV:         82 LV SV Index:   38 LVOT Area:     4.15 cm  LV Volumes (MOD) LV vol d, MOD A2C: 115.0 ml LV vol d, MOD A4C: 86.0 ml LV vol s, MOD A2C: 40.5 ml LV vol s, MOD A4C: 25.4 ml LV SV MOD A2C:     74.5 ml LV SV MOD A4C:     86.0 ml LV SV MOD BP:      65.8 ml RIGHT VENTRICLE             IVC RV S prime:     21.60 cm/s   IVC diam: 2.00 cm TAPSE (M-mode): 2.6 cm LEFT ATRIUM  Index        RIGHT ATRIUM           Index LA diam:        3.00 cm 1.37 cm/m   RA Area:     14.10 cm LA Vol (A2C):   43.0 ml 19.67 ml/m  RA Volume:   32.00 ml  14.64 ml/m LA Vol (A4C):   62.0 ml 28.36 ml/m LA Biplane Vol: 54.3 ml 24.84 ml/m  AORTIC VALVE LVOT Vmax:   94.80 cm/s LVOT Vmean:  64.100 cm/s LVOT VTI:    0.198 m  AORTA Ao Root diam: 4.00 cm Ao Asc diam:  4.40 cm MITRAL VALVE               TRICUSPID VALVE MV Area (PHT): 3.53 cm    TR Peak grad:   23.2 mmHg MV Decel Time: 215 msec    TR Vmax:        241.00 cm/s MV E velocity: 80.90 cm/s MV A velocity: 88.80 cm/s  SHUNTS MV E/A ratio:  0.91        Systemic VTI:  0.20 m                            Systemic Diam: 2.30 cm Oswaldo Milian MD Electronically signed by Oswaldo Milian MD Signature Date/Time: 09/06/2021/4:01:23 PM    Final      Results/Tests Pending at Time of Discharge:  Unresulted Labs (From admission, onward)    None        Discharge Medications:  Allergies as of 09/24/2021   No Known Allergies      Medication List     STOP taking these medications    Accu-Chek Aviva Plus test strip Generic drug: glucose blood   Accu-Chek Aviva Plus w/Device Kit   Accu-Chek Softclix Lancets lancets   Alcohol Swabs Pads   dorzolamide-timolol 22.3-6.8 MG/ML ophthalmic solution Commonly known as: COSOPT   Tresiba FlexTouch 100 UNIT/ML FlexTouch Pen Generic drug: insulin degludec   Unifine Pentips 31G X 5 MM Misc Generic drug: Insulin Pen Needle       TAKE these medications    aspirin EC 81 MG tablet Take 1 tablet (81 mg total) by mouth daily. Swallow whole.   atorvastatin 40 MG tablet Commonly known as: LIPITOR Take 1 tablet (40 mg total) by mouth daily.   carvedilol 3.125 MG tablet Commonly known as: COREG Take 1 tablet (3.125 mg total) by mouth 2 (two) times daily with a meal.   citalopram 20 MG tablet Commonly known as:  CELEXA Take 1 tablet (20 mg total) by mouth daily.   finasteride 5 MG tablet Commonly known as: PROSCAR Take 1 tablet (5 mg total) by mouth daily.   lidocaine 5 % Commonly known as: LIDODERM Place 1 patch onto the skin daily. Remove & Discard patch within 12 hours or as directed by MD   losartan 50 MG tablet Commonly known as: COZAAR Take 1.5 tablets (75 mg total) by mouth at bedtime. What changed: how much to take   melatonin 5 MG Tabs Take 1 tablet (5 mg total) by mouth at bedtime.   metFORMIN 500 MG 24 hr tablet Commonly known as: GLUCOPHAGE-XR Take 1 tablet (500 mg total) by mouth 2 (two) times daily with a meal.   nitroGLYCERIN 0.4 MG SL tablet Commonly known as: Nitrostat Place 1 tablet (0.4 mg total) under the tongue every 5 (five) minutes as needed for chest  pain.   olopatadine 0.1 % ophthalmic solution Commonly known as: PATANOL Place 1 drop into both eyes 2 (two) times daily as needed (dry eyes).   polyethylene glycol powder 17 GM/SCOOP powder Commonly known as: GLYCOLAX/MIRALAX Take  1 capful (17 g) by mouth daily.   QUEtiapine 25 MG tablet Commonly known as: SEROQUEL Take 3 tablets (75 mg total) by mouth at bedtime.   senna 8.6 MG Tabs tablet Commonly known as: SENOKOT Take 1 tablet (8.6 mg total) by mouth daily.   tamsulosin 0.4 MG Caps capsule Commonly known as: FLOMAX Take 2 capsules (0.8 mg total) by mouth daily.               Durable Medical Equipment  (From admission, onward)           Start     Ordered   06/03/21 0910  For home use only DME 4 wheeled rolling walker with seat  Once       Question:  Patient needs a walker to treat with the following condition  Answer:  Generalized weakness   06/03/21 0910            Discharge Instructions:  Please refer to Patient Instructions section of EMR for full details.  Patient was counseled important signs and symptoms that should prompt return to medical care, changes in medications,  dietary instructions, activity restrictions, and follow up appointments.   Follow-Up Appointments: No follow-ups on file.   [name], DO 09/24/2021, 10:39 AM PGY-1, [place]
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
## 2021-08-27 NOTE — Progress Notes (Signed)
CSW made additional attempt to reach patient's guardian of his estate Mr. Arrington without success - a voicemail was left requesting a return call.  [name], MSW, LCSW Transitions of Care  Clinical Social Worker II [phone]

## 2021-08-28 DIAGNOSIS — F322 Major depressive disorder, single episode, severe without psychotic features: Secondary | ICD-10-CM | POA: Diagnosis not present

## 2021-08-28 NOTE — Progress Notes (Signed)
FPTS Brief Note Reviewed patient's vitals, recent notes.   Vitals:   08/27/21 0719 08/28/21 0640  BP: (!) 141/86 140/85  Pulse: 73 71  Resp: 18 18  Temp: 97.9 F (36.6 C) 98 F (36.7 C)  SpO2: 100% 100%   At this time, no change in plan from day progress note.  Gerlene Fee, DO Page [phone] with questions about this patient.

## 2021-08-29 DIAGNOSIS — F322 Major depressive disorder, single episode, severe without psychotic features: Secondary | ICD-10-CM | POA: Diagnosis not present

## 2021-08-29 NOTE — Progress Notes (Signed)
FPTS Brief Note Reviewed patient's vitals, recent notes.  Vitals:   08/29/21 1700 08/29/21 2107  BP: (!) 151/75 (!) 148/77  Pulse: 76 91  Resp: 17 20  Temp: 98.2 F (36.8 C) 98 F (36.7 C)  SpO2:  96%   At this time, no change in plan from day progress note.   Alcus Dad, MD Page [phone] with questions about this patient.

## 2021-08-29 NOTE — Progress Notes (Signed)
Family Medicine Teaching Service Daily Progress Note Intern Pager: [phone]  Patient name: Charles Fox Medical record number: [id number] Date of birth: 11-11-1950 Age: 70 y.o. Gender: male  Primary Care Provider: [name], MD Consultants: Psych Code Status: Full   [name]:  8/19: Admitted for altered mental status and hematuria 8/20: Normal brain MRI and improved mentation  8/21: MoCA 12/30, determined to not have capacity 8/22: Discussion with son revealed patient does not have a safe place for discharge 8/23: PSA elevated.  Urology recommending keeping catheter and rechecking PSA in 1 month. 8/24: Voiding trial 8/25: Suicide precautions, voiding trial failed, Foley reinserted 8/26: Suicide precautions removed 8/29 agitated and combative, delirium precautions in place 9/5: AKI 9/9: AKI resolved 9/18: Foley removed 9/30: reported SI, placed on suicide precautions with sitter 10/1: suicide precautions removed 10/3: Self reported fall 10/20: New guardian: Charles Fox 10/21: Guardianship hearing, granted guardianship over person (not estate) 10/25: Informed by SW that APS worker applying for medicaid  11/3: Flu vaxx with guardian consent  11/6: Suicide precautions, MoCA 8/30 11/9: Suicide precautions removed 11/19: Contacted liaison for [place] [employer] 11/22: Visited by [facility] [employer] 11/30: Court date for guardianship of estate 12/1: [place] ([employer])-vaxx with guardian consent 12/7: TB skin test  Assessment and Plan:  Charles Fox is a 70 y.o. male presented with Hematuria and Altered Mental Status in August. PMHx of T2DM, HTN, HLD, dementia, hx of CVA, housing and security.   Difficult placement  Housing insecurity  DSS appointed guardianship and was granted guardianship of his estate which is needed for SNF payment to Charles Fox  Patient is medically stable and ready for discharge - CSW following, appreciate assistance - No  visitors due to concern for financial misuse  Dementia  MDD - Continue Seroquel 50 mg nightly, Celexa 20 mg daily, melatonin 5 mg nightly  FEN/GI: Regular PPx: none  Disposition: [facility]   Subjective:  No acute events overnight.  Patient feeling well, with no questions or concerns.  Objective:   Physical Exam: General: laying in bed, pleasant, NAD Cardiovascular: RRR no murmurs Respiratory: CTAB normal WOB Abdomen: soft, non distended  Extremities: warm, dry  Laboratory: No results for input(s): WBC, HGB, HCT, PLT in the last 168 hours. No results for input(s): NA, K, CL, CO2, BUN, CREATININE, CALCIUM, PROT, BILITOT, ALKPHOS, ALT, AST, GLUCOSE in the last 168 hours.  Invalid input(s): LABALBU   Imaging/Diagnostic Tests:  None new  Shary Key, DO 08/29/2021, 6:45 AM PGY-2, [place] Intern pager: [phone], text pages welcome

## 2021-08-30 DIAGNOSIS — G309 Alzheimer's disease, unspecified: Secondary | ICD-10-CM | POA: Diagnosis not present

## 2021-08-30 DIAGNOSIS — F322 Major depressive disorder, single episode, severe without psychotic features: Secondary | ICD-10-CM | POA: Diagnosis not present

## 2021-08-30 DIAGNOSIS — F02C2 Dementia in other diseases classified elsewhere, severe, with psychotic disturbance: Secondary | ICD-10-CM | POA: Diagnosis not present

## 2021-08-30 NOTE — Progress Notes (Signed)
Family Medicine Teaching Service Daily Progress Note Intern Pager: [phone]  Patient name: Charles Fox Medical record number: [id number] Date of birth: 07/26/1951 Age: 70 y.o. Gender: male  Primary Care Provider: [name], MD Consultants: Psych (signed off) Code Status: Full Code   [name]:  8/19: Admitted for altered mental status and hematuria 8/20: Normal brain MRI and improved mentation  8/21: MoCA 12/30, determined to not have capacity 8/22: Discussion with son revealed patient does not have a safe place for discharge 8/23: PSA elevated.  Urology recommending keeping catheter and rechecking PSA in 1 month. 8/24: Voiding trial 8/25: Suicide precautions, voiding trial failed, Foley reinserted 8/26: Suicide precautions removed 8/29 agitated and combative, delirium precautions in place 9/5: AKI 9/9: AKI resolved 9/18: Foley removed 9/30: reported SI, placed on suicide precautions with sitter 10/1: suicide precautions removed 10/3: Self reported fall 10/20: New guardian: Charles Fox 10/21: Guardianship hearing, granted guardianship over person (not estate) 10/25: Informed by SW that APS worker applying for medicaid  11/3: Flu vaxx with guardian consent  11/6: Suicide precautions, MoCA 8/30 11/9: Suicide precautions removed 11/19: Contacted liaison for [place] [employer] 11/22: Visited by [facility] [employer] 11/30: Court date for guardianship of estate 12/1: [place] ([employer])-vaxx with guardian consent 12/7: TB skin test  Assessment and Plan: Charles Fox is a 70 y.o. male presented with Hematuria and Altered Mental Status in August. PMHx of T2DM, HTN, HLD, dementia, hx of CVA, housing and security.    Difficult Placement  Housing Insecurity  Patient lacks capacity, is at cognitive baseline, still lacks ability to manage his own affairs including healthcare and financial decisions. DSS was appointed for guardianship of the patient and on 11/30  was granted guardianship of his estate which is needed to determine payment for SNF. Patient is medically STABLE and READY FOR DISCHARGE. Appreciate CSW/CM for assistance with disposition No visitors due to concern for familial financial misuse  Dementia  MDD Seroquel 50 mg nightly Celexa 20 mg daily Melatonin 5 mg nightly  FEN/GI: Regular  PPx:   None Dispo: [facility] [facility]. Barriers include NONE. Patient is medically STABLE and READY FOR DISCHARGE.    Subjective:  Charles Fox was seen this AM. Stated that he is feeling fine, and has no acute concerns.  Objective: Temp:  [97.9 F (36.6 C)-98.2 F (36.8 C)] 97.9 F (36.6 C) (12/12 0735) Pulse Rate:  [67-91] 67 (12/12 0735) Resp:  [17-20] 17 (12/12 0735) BP: (148-151)/(75-80) 150/80 (12/12 0735) SpO2:  [96 %-100 %] 100 % (12/12 0735) Physical Exam Vitals and nursing note reviewed.  Constitutional:      General: He is not in acute distress.    Appearance: He is not ill-appearing, toxic-appearing or diaphoretic.  HENT:     Head: Normocephalic and atraumatic.  Cardiovascular:     Rate and Rhythm: Normal rate and regular rhythm.     Heart sounds: Normal heart sounds. No murmur heard.   No friction rub. No gallop.  Pulmonary:     Effort: Pulmonary effort is normal. No respiratory distress.     Breath sounds: Normal breath sounds. No wheezing or rhonchi.  Abdominal:     Palpations: Abdomen is soft.  Skin:    General: Skin is warm and dry.  Neurological:     Mental Status: He is alert. Mental status is at baseline.     Laboratory: No results for input(s): WBC, HGB, HCT, PLT in the last 168 hours. No results for input(s):  NA, K, CL, CO2, BUN, CREATININE, CALCIUM, PROT, BILITOT, ALKPHOS, ALT, AST, GLUCOSE in the last 168 hours.  Invalid input(s): LABALBU No results found for this or any previous visit (from the past 24 hour(s)).  Imaging/Diagnostic Tests: No results found.   Charles Brittle, DO 08/30/2021, 2:12 PM PGY-1, [place] Intern pager: [phone], text pages welcome

## 2021-08-30 NOTE — Progress Notes (Signed)
FPTS Brief Note Reviewed patient's vitals, recent notes.  Vitals:   08/30/21 0735 08/30/21 1940  BP: (!) 150/80 (!) 154/75  Pulse: 67 86  Resp: 17 17  Temp: 97.9 F (36.6 C) 97.9 F (36.6 C)  SpO2: 100% 96%   At this time, no change in plan from day progress note.   Alcus Dad, MD Page [phone] with questions about this patient.

## 2021-08-30 NOTE — Progress Notes (Signed)
An additional message was left with Educational psychologist requesting a return call.  [name], MSW, LCSW Transitions of Care  Clinical Social Worker II [phone]

## 2021-08-30 NOTE — Plan of Care (Signed)

## 2021-08-31 NOTE — Plan of Care (Signed)

## 2021-08-31 NOTE — Progress Notes (Signed)
Family Medicine Teaching Service Daily Progress Note Intern Pager: [phone]  Patient name: Charles Fox Medical record number: [id number] Date of birth: July 28, 1951 Age: 70 y.o. Gender: male  Primary Care Provider: [name], MD Consultants: Psych (signed off) Code Status: Full Code   [name]:  8/19: Admitted for altered mental status and hematuria 8/20: Normal brain MRI and improved mentation  8/21: MoCA 12/30, determined to not have capacity 8/22: Discussion with son revealed patient does not have a safe place for discharge 8/23: PSA elevated.  Urology recommending keeping catheter and rechecking PSA in 1 month. 8/24: Voiding trial 8/25: Suicide precautions, voiding trial failed, Foley reinserted 8/26: Suicide precautions removed 8/29 agitated and combative, delirium precautions in place 9/5: AKI 9/9: AKI resolved 9/18: Foley removed 9/30: reported SI, placed on suicide precautions with sitter 10/1: suicide precautions removed 10/3: Self reported fall 10/20: New guardian: [name] 10/21: Guardianship hearing, granted guardianship over person (not estate) 10/25: Informed by SW that APS worker applying for medicaid  11/3: Flu vaxx with guardian consent  11/6: Suicide precautions, MoCA 8/30 11/9: Suicide precautions removed 11/19: Contacted liaison for [place] [employer] 11/22: Visited by [facility] [employer] 11/30: Court date for guardianship of estate 12/1: [place] ([employer])-vaxx with guardian consent 12/7: TB skin test negative  Assessment and Plan: Charles Fox is a 70 y.o. male presented with Hematuria and Altered Mental Status in August. PMHx of T2DM, HTN, HLD, dementia, hx of CVA, housing and security.  HD 112   Difficult Placement   Housing Insecurity  Patient lacks capacity, is at cognitive baseline, still lacks ability to manage his own affairs including healthcare and financial decisions. DSS was appointed for guardianship of the  patient and on 11/30 was granted guardianship of his estate which is needed to determine payment for SNF. Patient is medically STABLE and READY FOR DISCHARGE. Appreciate CSW/TM for assistance with disposition No visitors due to concern for familial financial misuse   Dementia   MDD Seroquel 50 mg qHS Celexa 20 mg daily Melatonin 5 mg qHS  FEN/GI: Regular  PPx:   None Dispo: [facility] [facility]. Barriers include NONE. Patient is medically STABLE and READY FOR DISCHARGE.   Subjective:  Charles Fox was seen this AM. Stated that he feels all right, and has no acute concerns.  Objective: Temp:  [97.7 F (36.5 C)-97.9 F (36.6 C)] 97.7 F (36.5 C) (12/13 1127) Pulse Rate:  [77-86] 77 (12/13 1127) Resp:  [17-18] 18 (12/13 1127) BP: (154-163)/(75-82) 163/82 (12/13 1127) SpO2:  [96 %-100 %] 100 % (12/13 1127) Physical Exam Vitals and nursing note reviewed.  Constitutional:      General: He is not in acute distress.    Appearance: He is not ill-appearing, toxic-appearing or diaphoretic.  HENT:     Head: Normocephalic and atraumatic.  Cardiovascular:     Rate and Rhythm: Normal rate and regular rhythm.     Heart sounds: Normal heart sounds. No murmur heard.   No friction rub. No gallop.  Pulmonary:     Effort: Pulmonary effort is normal. No respiratory distress.     Breath sounds: Normal breath sounds. No wheezing or rhonchi.  Abdominal:     Palpations: Abdomen is soft.  Skin:    General: Skin is warm and dry.  Neurological:     Mental Status: He is alert. Mental status is at baseline.     Laboratory: No results for input(s): WBC, HGB, HCT, PLT in the last 168  hours. No results for input(s): NA, K, CL, CO2, BUN, CREATININE, CALCIUM, PROT, BILITOT, ALKPHOS, ALT, AST, GLUCOSE in the last 168 hours.  Invalid input(s): LABALBU No results found for this or any previous visit (from the past 24 hour(s)).  Imaging/Diagnostic Tests: No results found.   [name], DO 08/31/2021, 1:07  PM PGY-1, [place] Intern pager: [phone], text pages welcome

## 2021-08-31 NOTE — Progress Notes (Signed)
FPTS Brief Note Reviewed patient's vitals, recent notes.  Vitals:   08/31/21 1127 08/31/21 2010  BP: (!) 163/82 (!) 179/81  Pulse: 77 81  Resp: 18 18  Temp: 97.7 F (36.5 C) 98 F (36.7 C)  SpO2: 100% 99%   At this time, no change in plan from day progress note. Continue to monitor BP- consider med adjustment if persistently elevated to 160-170s.   Alcus Dad, MD Page [phone] with questions about this patient.

## 2021-08-31 NOTE — Progress Notes (Addendum)
11:30am: CSW met with patient at bedside to provide him with an update regarding his discharge plan. Patient requesting to go outside to smoke but declined wanting nicotine patch. Patient unable to tell CSW what the name of the financial institution he utilizes for his other income. CSW explained barriers to placement to patient and he stated understanding but then was unable to repeat what he was told. CSW provided patient with a diet [employer] from [employer]. Patient did not have any questions or concerns to address besides wanting to leave ASAP.  8:45am: CSW attempted to reach Dorise Hiss, patient's guardian ad litem without success - a voicemail was left requesting a return call. CSW also sent a secure e-mail to [place] requesting her assistance.  [name], MSW, LCSW Transitions of Care  Clinical Social Worker II [phone]

## 2021-09-01 NOTE — Progress Notes (Signed)
Spoke with Mr. Simone Curia, attorney and guardian of the patient's estate.  He reports that he has not yet received the paperwork on this patient so has no access to his financial information.  He recommended that we speak to the patient's guardian ad litem and have them send the patient's information to his firm.  He also reported he would call the social worker [name] back this afternoon but he has clients on morning.  We will continue on working on finding the rest of this patient's financial information to help with placement.

## 2021-09-01 NOTE — Progress Notes (Signed)
CSW spoke with Doyne Keel Director regarding the conversation that occurred with [employer]. Per Edwyna Ready, Mr. Simone Curia has yet to obtain the order to serve as the guardian of the patient's estate from the [place] yet and will need additional time to explore any additional asssets.  CSW spoke with Lanae at [facility] to provide her with an update from [employer] and [employer].   [name], MSW, LCSW Transitions of Care   Clinical Social Worker II [phone]

## 2021-09-01 NOTE — Progress Notes (Signed)
Family Medicine Teaching Service Daily Progress Note Intern Pager: [phone]  Patient name: Charles Fox Medical record number: [id number] Date of birth: 1951/07/20 Age: 70 y.o. Gender: male  Primary Care Provider: [name], MD Consultants: Psych (signed off) Code Status: Full Code   [name]:  8/19: Admitted for altered mental status and hematuria 8/20: Normal brain MRI and improved mentation  8/21: MoCA 12/30, determined to not have capacity 8/22: Discussion with son revealed patient does not have a safe place for discharge 8/23: PSA elevated.  Urology recommending keeping catheter and rechecking PSA in 1 month. 8/24: Voiding trial 8/25: Suicide precautions, voiding trial failed, Foley reinserted 8/26: Suicide precautions removed 8/29 agitated and combative, delirium precautions in place 9/5: AKI 9/9: AKI resolved 9/18: Foley removed 9/30: reported SI, placed on suicide precautions with sitter 10/1: suicide precautions removed 10/3: Self reported fall 10/20: New guardian: [name] 10/21: Guardianship hearing, granted guardianship over person (not estate) 10/25: Informed by SW that APS worker applying for medicaid  11/3: Flu vaxx with guardian consent  11/6: Suicide precautions, MoCA 8/30 11/9: Suicide precautions removed 11/19: Contacted liaison for [place] [employer] 11/22: Visited by [facility] [employer] 11/30: Court date for guardianship of estate 12/1: [place] ([employer])-vaxx with guardian consent 12/7: TB skin test negative  Assessment and Plan: Charles Fox is a 70 y.o. male presented with Hematuria and Altered Mental Status in August. PMHx of T2DM, HTN, HLD, dementia, hx of CVA, housing and security.  HD 113   Difficult Placement   Housing Insecurity  Patient lacks capacity, is at cognitive baseline, still lacks ability to manage his own affairs including healthcare and financial decisions. DSS was appointed for guardianship of the  patient and on 11/30 was granted guardianship of his estate which is needed to determine payment for SNF. Patient is medically STABLE and READY FOR DISCHARGE. Appreciate CSW/CM for assistance with disposition   Dementia   MDD Seroquel 50 mg nightly Celexa 20 mg daily Melatonin 5 mg nightly  FEN/GI: Regular  PPx:   None Dispo: [facility]. Barriers include NONE. Patient is medically STABLE and READY FOR DISCHARGE.   Subjective:  [name] was seen this AM.  Stated that he wants to go outside and have a cigarette.  Discussed with patient that the hospital does not provide cigarettes, patient stated that he understood and was okay without the cigarette.  Objective: Temp:  [98 F (36.7 C)] 98 F (36.7 C) (12/13 2010) Pulse Rate:  [71-81] 71 (12/14 0840) Resp:  [17-18] 17 (12/14 0840) BP: (138-179)/(78-81) 138/78 (12/14 0840) SpO2:  [97 %-99 %] 97 % (12/14 0840) Physical Exam Vitals and nursing note reviewed.  Constitutional:      General: He is not in acute distress.    Appearance: He is not ill-appearing, toxic-appearing or diaphoretic.  HENT:     Head: Normocephalic and atraumatic.  Cardiovascular:     Rate and Rhythm: Normal rate and regular rhythm.     Heart sounds: Normal heart sounds. No murmur heard.   No friction rub. No gallop.  Pulmonary:     Effort: Pulmonary effort is normal. No respiratory distress.     Breath sounds: Normal breath sounds. No wheezing or rhonchi.  Abdominal:     Palpations: Abdomen is soft.  Skin:    General: Skin is warm and dry.  Neurological:     Mental Status: He is alert. Mental status is at baseline.     Laboratory: No  results for input(s): WBC, HGB, HCT, PLT in the last 168 hours. No results for input(s): NA, K, CL, CO2, BUN, CREATININE, CALCIUM, PROT, BILITOT, ALKPHOS, ALT, AST, GLUCOSE in the last 168 hours.  Invalid input(s): LABALBU No results found for this or any previous visit (from the past 24 hour(s)).  Imaging/Diagnostic  Tests: No results found.   Charles Brittle, DO 09/01/2021, 11:35 AM PGY-1, [place] Intern pager: [phone], text pages welcome

## 2021-09-02 NOTE — Progress Notes (Signed)
Family Medicine Teaching Service Daily Progress Note Intern Pager: [phone]  Patient name: Charles Fox Medical record number: [id number] Date of birth: May 02, 1951 Age: 70 y.o. Gender: male  Primary Care Provider: [name], MD Consultants: None Code Status: Full Code   [name]:  8/19: Admitted for altered mental status and hematuria 8/20: Normal brain MRI and improved mentation  8/21: MoCA 12/30, determined to not have capacity 8/22: Discussion with son revealed patient does not have a safe place for discharge 8/23: PSA elevated.  Urology recommending keeping catheter and rechecking PSA in 1 month. 8/24: Voiding trial 8/25: Suicide precautions, voiding trial failed, Foley reinserted 8/26: Suicide precautions removed 8/29 agitated and combative, delirium precautions in place 9/5: AKI 9/9: AKI resolved 9/18: Foley removed 9/30: reported SI, placed on suicide precautions with sitter 10/1: suicide precautions removed 10/3: Self reported fall 10/20: New guardian: Charles Fox 10/21: Guardianship hearing, granted guardianship over person (not estate) 10/25: Informed by SW that APS worker applying for medicaid  11/3: Flu vaxx with guardian consent  11/6: Suicide precautions, MoCA 8/30 11/9: Suicide precautions removed 11/19: Contacted liaison for [place] [employer] 11/22: Visited by  [employer] 11/30: Court date for guardianship of estate 12/1: [place] ([employer])-vaxx with guardian consent 12/7: TB skin test negative  Assessment and Plan: Charles Fox is a 70 y.o. male presented with Hematuria and Altered Mental Status in August. PMHx of T2DM, HTN, HLD, dementia, hx of CVA, housing and security.  HD 114   Difficult Placement   Housing Insecurity  Patient lacks capacity, is at cognitive baseline, still lacks ability to manage his own affairs including healthcare and financial decisions. DSS was appointed for guardianship of the patient and on  11/30 was granted guardianship of his estate which is needed to determine payment for SNF. Patient is medically STABLE and READY FOR DISCHARGE. Appreciate CSW/CM for assistance with disposition  Dementia   MDD Seroquel 50 mg nightly Celexa 20 mg daily Melatonin 5 mg nightly  Health maintenance Monthly labs Follow-up CBC, BMP, lipid panel, A1c  FEN/GI: Regular  PPx:   None Dispo: [facility]. Barriers include NONE. Patient is medically STABLE and READY FOR DISCHARGE.    Subjective:  Charles Fox was seen this AM. Stated that he feels all right, he is bit bummed out that it is raining outside.  Objective: Temp:  [98.4 F (36.9 C)-98.6 F (37 C)] 98.4 F (36.9 C) (12/15 0811) Pulse Rate:  [83-92] 92 (12/15 0811) Resp:  [17-18] 17 (12/15 0811) BP: (135-147)/(71-86) 135/71 (12/15 0811) SpO2:  [93 %-94 %] 93 % (12/15 7989) Physical Exam Vitals and nursing note reviewed.  Constitutional:      General: He is not in acute distress.    Appearance: He is not ill-appearing, toxic-appearing or diaphoretic.  HENT:     Head: Normocephalic and atraumatic.  Cardiovascular:     Rate and Rhythm: Normal rate and regular rhythm.     Heart sounds: Normal heart sounds. No murmur heard.   No friction rub. No gallop.  Pulmonary:     Effort: Pulmonary effort is normal. No respiratory distress.     Breath sounds: Normal breath sounds. No wheezing or rhonchi.  Abdominal:     Palpations: Abdomen is soft.  Skin:    General: Skin is warm and dry.  Neurological:     Mental Status: He is alert. Mental status is at baseline.     Laboratory: No results for input(s): WBC, HGB, HCT,  PLT in the last 168 hours. No results for input(s): NA, K, CL, CO2, BUN, CREATININE, CALCIUM, PROT, BILITOT, ALKPHOS, ALT, AST, GLUCOSE in the last 168 hours.  Invalid input(s): LABALBU No results found for this or any previous visit (from the past 24 hour(s)).  Imaging/Diagnostic Tests: No results found.   Charles Brittle, DO 09/02/2021, 1:57 PM PGY-1, [place] Intern pager: [phone], text pages welcome

## 2021-09-02 NOTE — Progress Notes (Signed)
FPTS Brief Note Reviewed patient's vitals, recent notes.  Vitals:   09/02/21 0811 09/02/21 2135  BP: 135/71 128/73  Pulse: 92 83  Resp: 17 16  Temp: 98.4 F (36.9 C) 97.8 F (36.6 C)  SpO2: 93% 99%   At this time, no change in plan from day progress note.   Alcus Dad, MD Page [phone] with questions about this patient.

## 2021-09-02 NOTE — Progress Notes (Signed)
FPTS Brief Note Reviewed patient's vitals, recent notes.  Vitals:   09/01/21 0840 09/01/21 2012  BP: 138/78 (!) 147/86  Pulse: 71 83  Resp: 17 18  Temp: 98.7 F (37.1 C) 98.6 F (37 C)  SpO2: 97% 94%   At this time, no change in plan from day progress note.  Appreciate assistance from [facility] with this case of very difficult placement.   Alcus Dad, MD Page [phone] with questions about this patient.

## 2021-09-03 LAB — LIPID PANEL
Cholesterol: 153 mg/dL (ref 0–200)
HDL: 58 mg/dL (ref >40)
LDL Cholesterol: 83 mg/dL (ref 0–99)
Total CHOL/HDL Ratio: 2.6 RATIO
Triglycerides: 58 mg/dL (ref <150)
VLDL: 12 mg/dL (ref 0–40)

## 2021-09-03 LAB — CBC
HCT: 35 % — ABNORMAL LOW (ref 39.0–52.0)
Hemoglobin: 11.4 g/dL — ABNORMAL LOW (ref 13.0–17.0)
MCH: 29.9 pg (ref 26.0–34.0)
MCHC: 32.6 g/dL (ref 30.0–36.0)
MCV: 91.9 fL (ref 80.0–100.0)
Platelets: 255 10*3/uL (ref 150–400)
RBC: 3.81 MIL/uL — ABNORMAL LOW (ref 4.22–5.81)
RDW: 15 % (ref 11.5–15.5)
WBC: 8.8 10*3/uL (ref 4.0–10.5)
nRBC: 0 % (ref 0.0–0.2)

## 2021-09-03 LAB — BASIC METABOLIC PANEL
Anion gap: 6 (ref 5–15)
BUN: 14 mg/dL (ref 8–23)
CO2: 27 mmol/L (ref 22–32)
Calcium: 8.9 mg/dL (ref 8.9–10.3)
Chloride: 104 mmol/L (ref 98–111)
Creatinine, Ser: 0.83 mg/dL (ref 0.61–1.24)
GFR, Estimated: >60 mL/min (ref >60)
Glucose, Bld: 95 mg/dL (ref 70–99)
Potassium: 4.1 mmol/L (ref 3.5–5.1)
Sodium: 137 mmol/L (ref 135–145)

## 2021-09-03 LAB — HEMOGLOBIN A1C
Hgb A1c MFr Bld: 6.7 % — ABNORMAL HIGH (ref 4.8–5.6)
Mean Plasma Glucose: 145.59 mg/dL

## 2021-09-03 MED ORDER — QUETIAPINE FUMARATE 100 MG PO TABS: 100.0000 mg | ORAL_TABLET | Freq: Every day | ORAL | Status: DC

## 2021-09-03 MED ORDER — QUETIAPINE FUMARATE 50 MG PO TABS
75.0000 mg | ORAL_TABLET | Freq: Every day | ORAL | Status: DC
  Administered 2021-09-03 – 2021-09-23 (×21): 75 mg via ORAL
  Filled 2021-09-03 (×21): qty 1

## 2021-09-03 NOTE — Progress Notes (Signed)
Family Medicine Teaching Service Daily Progress Note Intern Pager: [phone]  Patient name: Charles Fox Medical record number: [id number] Date of birth: [birth year] Age: 70 y.o. Gender: male  Primary Care Provider: [name], MD Consultants: None Code Status: Full Code   [name]:  8/19: Admitted for altered mental status and hematuria 8/20: Normal brain MRI and improved mentation  8/21: MoCA 12/30, determined to not have capacity 8/22: Discussion with son revealed patient does not have a safe place for discharge 8/23: PSA elevated.  Urology recommending keeping catheter and rechecking PSA in 1 month. 8/24: Voiding trial 8/25: Suicide precautions, voiding trial failed, Foley reinserted 8/26: Suicide precautions removed 8/29 Agitated and combative, delirium precautions in place 9/5: AKI 9/9: AKI resolved 9/18: Foley removed 9/30: Reported SI, placed on suicide precautions with sitter 10/1: Suicide precautions removed 10/3: Self reported fall 10/20: New guardian: [name] 10/21: Guardianship hearing, granted guardianship over person (not estate) 10/25: Informed by SW that APS worker applying for medicaid  11/3: Flu vaxx with guardian consent  11/6: Suicide precautions, MoCA 8/30 11/9: Suicide precautions removed 11/19: Contacted liaison for [employer] 11/22: Visited by [employer] 11/30: Court date for guardianship of estate 12/1: COVID (Pfizer)-vaxx with guardian consent 12/7: TB skin test negative  Assessment and Plan: Charles Fox is a 70 y.o. male presented with Hematuria and Altered Mental Status in August now resolved and stable for DC. PMHx of PMHx of T2DM, HTN, HLD, dementia, hx of CVA, housing and security.  Total duration of encounter: 119 days    Difficult Placement   Housing Insecurity  Patient lacks capacity, is at cognitive baseline, still lacks ability to manage his own affairs including healthcare and financial  decisions. DSS was appointed for guardianship of the patient and on 11/30 was granted guardianship of his estate which is needed to determine payment for SNF. Patient is medically STABLE and READY FOR DISCHARGE. Appreciate CSW/CM for assistance with disposition   Dementia   MDD Celexa 20 mg daily Seroquel 100 mg qHS Melatonin 5 mg qHS   Health maintenance A1c 6.7 (09/03/2021), Hb 11.4, lipid panel WNL.  FEN/GI: Regular  PPx: None Dispo: Likely  [place] [facility]. Barriers include NONE. Patient is medically STABLE and READY FOR DISCHARGE.  Subjective:  Charles Fox was seen this AM. Stated that he did not sleep well, but is not too bothered by it.  Otherwise he has no acute concerns.  Objective: Temp:  [97.2 F (36.2 C)-97.9 F (36.6 C)] 97.9 F (36.6 C) (12/16 1300) Pulse Rate:  [82-95] 95 (12/16 1300) Resp:  [16-17] 16 (12/16 1300) BP: (115-150)/(65-79) 115/65 (12/16 1300) SpO2:  [98 %-99 %] 98 % (12/16 1300) Physical Exam Vitals and nursing note reviewed.  Constitutional:      General: He is not in acute distress.    Appearance: He is not ill-appearing, toxic-appearing or diaphoretic.  HENT:     Head: Normocephalic and atraumatic.  Cardiovascular:     Rate and Rhythm: Normal rate and regular rhythm.     Heart sounds: Normal heart sounds. No murmur heard.   No friction rub. No gallop.  Pulmonary:     Effort: Pulmonary effort is normal. No respiratory distress.     Breath sounds: Normal breath sounds. No wheezing or rhonchi.  Abdominal:     Palpations: Abdomen is soft.  Skin:    General: Skin is warm and dry.  Neurological:     Mental Status: He is  alert. Mental status is at baseline.    Laboratory: Recent Labs  Lab 09/03/21 0141  WBC 8.8  HGB 11.4*  HCT 35.0*  PLT 255   Recent Labs  Lab 09/03/21 0141  NA 137  K 4.1  CL 104  CO2 27  BUN 14  CREATININE 0.83  CALCIUM 8.9  GLUCOSE 95   Results for orders placed or performed during the hospital encounter of  05/07/21 (from the past 24 hour(s))  Lipid panel     Status: None   Collection Time: 09/03/21  1:41 AM  Result Value Ref Range   Cholesterol 153 0 - 200 mg/dL   Triglycerides 58 <150 mg/dL   HDL 58 >40 mg/dL   Total CHOL/HDL Ratio 2.6 RATIO   VLDL 12 0 - 40 mg/dL   LDL Cholesterol 83 0 - 99 mg/dL  CBC     Status: Abnormal   Collection Time: 09/03/21  1:41 AM  Result Value Ref Range   WBC 8.8 4.0 - 10.5 K/uL   RBC 3.81 (L) 4.22 - 5.81 MIL/uL   Hemoglobin 11.4 (L) 13.0 - 17.0 g/dL   HCT 35.0 (L) 39.0 - 52.0 %   MCV 91.9 80.0 - 100.0 fL   MCH 29.9 26.0 - 34.0 pg   MCHC 32.6 30.0 - 36.0 g/dL   RDW 15.0 11.5 - 15.5 %   Platelets 255 150 - 400 K/uL   nRBC 0.0 0.0 - 0.2 %  Basic metabolic panel     Status: None   Collection Time: 09/03/21  1:41 AM  Result Value Ref Range   Sodium 137 135 - 145 mmol/L   Potassium 4.1 3.5 - 5.1 mmol/L   Chloride 104 98 - 111 mmol/L   CO2 27 22 - 32 mmol/L   Glucose, Bld 95 70 - 99 mg/dL   BUN 14 8 - 23 mg/dL   Creatinine, Ser 0.83 0.61 - 1.24 mg/dL   Calcium 8.9 8.9 - 10.3 mg/dL   GFR, Estimated >60 >60 mL/min   Anion gap 6 5 - 15  Hemoglobin A1c     Status: Abnormal   Collection Time: 09/03/21  1:41 AM  Result Value Ref Range   Hgb A1c MFr Bld 6.7 (H) 4.8 - 5.6 %   Mean Plasma Glucose 145.59 mg/dL    Imaging/Diagnostic Tests: No results found.   [name], DO 09/03/2021, 3:12 PM PGY-1, [place] Intern pager: [phone], text pages welcome

## 2021-09-04 NOTE — Progress Notes (Addendum)
FMTS Attending Daily Note: Charles Singh, MD  Team Pager [phone] Pager [phone]  I have seen and examined this patient, reviewed their chart. I have discussed this patient with the resident physician.  Addendums to below note include: Within note as copied text adjusted.  Back pain, monitor for fevers, at risk for hospital acquired viruses which could start as body aches.    I agree with the remainder of the findings, exam, and plan below.   Disposition: stable     Family Medicine Teaching Service Daily Progress Note Intern Pager: [phone]  Patient name: Charles Fox Medical record number: [id number] Date of birth: 1951/02/06 Age: 70 y.o. Gender: male  Primary Care Provider: [name], MD Consultants: None Code Status: Full  [name]:  8/19: Admitted for altered mental status and hematuria 8/20: Normal brain MRI and improved mentation  8/21: MoCA 12/30, determined to not have capacity 8/22: Discussion with son revealed patient does not have a safe place for discharge 8/23: PSA elevated.  Urology recommending keeping catheter and rechecking PSA in 1 month. 8/24: Voiding trial 8/25: Suicide precautions, voiding trial failed, Foley reinserted 8/26: Suicide precautions removed 8/29 Agitated and combative, delirium precautions in place 9/5: AKI 9/9: AKI resolved 9/18: Foley removed 9/30: Reported SI, placed on suicide precautions with sitter 10/1: Suicide precautions removed 10/3: Self reported fall 10/20: New guardian: Charles Fox 10/21: Guardianship hearing, granted guardianship over person (not estate) 10/25: Informed by SW that APS worker applying for medicaid  11/3: Flu vaxx with guardian consent  11/6: Suicide precautions, MoCA 8/30 11/9: Suicide precautions removed 11/19: Contacted liaison for [employer] 11/22: Visited by [employer] [employer] 11/30: Court date for guardianship of estate 12/1: COVID (Pfizer)-vaxx with guardian  consent 12/7: TB skin test negative 12/15: Awaiting financial POA to obtain court orders, waiting for this per SW   Assessment and Plan: Charles Fox is a 70 y.o. male presented with Hematuria and Altered Mental Status in August now resolved and stable for DC. PMHx of PMHx of T2DM, HTN, HLD, dementia, hx of CVA, housing and security.  Total duration of encounter: 119 days     Cognitive impairment, due to mood disorder and dementia and housing insecurity. Awaiting financial guardian to receive information and order before moving forward. Per SW conversation on 12/15 we are on pause until order received.   Hematuria, resolved previously elevated PSA. Will contact Urology 12/19 to determine next steps.    Dementia   MDD Celexa 20 mg daily Seroquel 75 mg qHS  -Repeat EKG 12/19 Melatonin 5 mg qHS   FEN/GI: Regular  PPx: None Dispo: Likely  [place] house. Barriers include NONE. Patient is medically STABLE and READY FOR DISCHARGE.  Subjective:  Doing well. Sleeping comfortably in bed  Later in AM endorsed some back pain.   Objective: Temp:  [96.1 F (35.6 C)-98 F (36.7 C)] 98 F (36.7 C) (12/17 0030) Pulse Rate:  [77-95] 77 (12/17 0030) Resp:  [16-17] 16 (12/17 0030) BP: (111-150)/(65-85) 111/83 (12/17 0030) SpO2:  [98 %-100 %] 99 % (12/17 0030) Physical Exam: General: Resting comfortably in bed. Easily aroused.  Cardiovascular: RRR. No murmurs Respiratory: CTAB, normal effort Abdomen: NABS, non tender to palpation Extremities: No LE edema, moves all ext. appropriately  Laboratory: Recent Labs  Lab 09/03/21 0141  WBC 8.8  HGB 11.4*  HCT 35.0*  PLT 255   Recent Labs  Lab 09/03/21 0141  NA 137  K 4.1  CL  104  CO2 27  BUN 14  CREATININE 0.83  CALCIUM 8.9  GLUCOSE 95    Imaging/Diagnostic Tests: No new imaging  Gerlene Fee, DO 09/04/2021, 3:54 AM PGY-3, [place] Intern pager: [phone], text pages welcome

## 2021-09-04 NOTE — Progress Notes (Signed)
FPTS Brief Note Reviewed patient's vitals, recent notes.  Vitals:   09/03/21 1949 09/04/21 0030  BP: 137/85 111/83  Pulse: 91 77  Resp: 16 16  Temp: 97.9 F (36.6 C) 98 F (36.7 C)  SpO2: 98% 99%   At this time, no change in plan from day progress note.  Erskine Emery, MD Page [phone] with questions about this patient.

## 2021-09-04 NOTE — Progress Notes (Signed)
FPTS Brief Note Reviewed patient's vitals, recent notes.  Vitals:   09/04/21 1555 09/04/21 2030  BP: 128/63 (!) 173/76  Pulse: 72 72  Resp: 18 18  Temp: 97.9 F (36.6 C) 97.7 F (36.5 C)  SpO2: 99% 100%   At this time, no change in plan from day progress note.   Alcus Dad, MD Page [phone] with questions about this patient.

## 2021-09-05 LAB — GLUCOSE, CAPILLARY: Glucose-Capillary: 184 mg/dL — ABNORMAL HIGH (ref 70–99)

## 2021-09-05 MED ORDER — NITROGLYCERIN 0.4 MG SL SUBL
SUBLINGUAL_TABLET | SUBLINGUAL | Status: AC
  Filled 2021-09-05: qty 3

## 2021-09-05 NOTE — Progress Notes (Signed)
Family Medicine Teaching Service Daily Progress Note Intern Pager: [phone]  Patient name: Charles Fox Medical record number: [id number] Date of birth: 11-Jul-1951 Age: 70 y.o. Gender: male  Primary Care Provider: [name], MD Consultants: None Code Status: Full  [name]:  8/19: Admitted for altered mental status and hematuria 8/20: Normal brain MRI and improved mentation  8/21: MoCA 12/30, determined to not have capacity 8/22: Discussion with son revealed patient does not have a safe place for discharge 8/23: PSA elevated.  Urology recommending keeping catheter and rechecking PSA in 1 month. 8/24: Voiding trial 8/25: Suicide precautions, voiding trial failed, Foley reinserted 8/26: Suicide precautions removed 8/29 Agitated and combative, delirium precautions in place 9/5: AKI 9/9: AKI resolved 9/18: Foley removed 9/30: Reported SI, placed on suicide precautions with sitter 10/1: Suicide precautions removed 10/3: Self reported fall 10/20: New guardian: [name] 10/21: Guardianship hearing, granted guardianship over person (not estate) 10/25: Informed by SW that APS worker applying for medicaid  11/3: Flu vaxx with guardian consent  11/6: Suicide precautions, MoCA 8/30 11/9: Suicide precautions removed 11/19: Contacted liaison for [employer] 11/22: Visited by [facility] [employer] 11/30: Court date for guardianship of estate 12/1: COVID (Pfizer)-vaxx with guardian consent 12/7: TB skin test negative 12/15: Awaiting financial POA to obtain court orders, waiting for this per SW   Assessment and Plan: GAD AYMOND is a 70 y.o. male presented with Hematuria and Altered Mental Status in August now resolved and stable for DC awaiting safe discharge plan due to dementia. PMHx of PMHx of T2DM, HTN, HLD, dementia, hx of CVA, housing and security.    Cognitive impairment, due to mood disorder and dementia and housing insecurity. Awaiting  financial guardian to receive information and order before moving forward.  -Per SW conversation on 12/15 we are on pause until order received.  -continue delirium  & fall precautions   Reported Dizziness  Denies dizziness with sitting upright in bed, no increased HR with sitting upright, no observed nystagmus.  - will need to monitor for repeated episodes of dizziness  -unclear if episode happened acutely or if this is distant memory given poor historian and nature of dementia   Hematuria, resolved previously elevated PSA.  Will contact Urology 12/19 for recommendations   Patient no longer has foley catheter, 2 occurrences UOP.  BPH  Continue finasteride 5mg  daily   HTN  Continue losartan 50mg  daily    Dementia   MDD Celexa 20 mg daily Seroquel 75 mg qHS  -Repeat EKG ordered for 12/19 Melatonin 5 mg qHS   FEN/GI: Regular diet PPx: None Dispo: Likely [facility] house. Patient is medically STABLE and READY FOR DISCHARGE.  Subjective:  Patient reports an episode of dizziness while trying to stand. He denies any further episodes overnight. He denies chest discomfort or breathing abnormalities. He adds that he has not felt this dizzy since he was young several years ago. He denies abnormal vision or repeated dizziness with sitting upright in bed.    Objective: Temp:  [97.7 F (36.5 C)-97.9 F (36.6 C)] 97.7 F (36.5 C) (12/17 2030) Pulse Rate:  [72] 72 (12/17 2030) Resp:  [18] 18 (12/17 2030) BP: (128-173)/(63-76) 173/76 (12/17 2030) SpO2:  [99 %-100 %] 100 % (12/17 2030)  Physical Exam: General: male appearing stated age in no acute distress Cardio: Normal S1 and S2, no S3 or S4. Rhythm is regular. No murmurs or rubs.  Bilateral radial pulses palpable Pulm: Clear  to auscultation bilaterally, no crackles, wheezing, or diminished breath sounds. Normal respiratory effort, stable on RA Abdomen: Bowel sounds normal. Abdomen soft and non-tender.  Extremities: No peripheral edema.  Warm/ well perfused.  Neuro: pt alert and oriented to self   Laboratory: Recent Labs  Lab 09/03/21 0141  WBC 8.8  HGB 11.4*  HCT 35.0*  PLT 255   Recent Labs  Lab 09/03/21 0141  NA 137  K 4.1  CL 104  CO2 27  BUN 14  CREATININE 0.83  CALCIUM 8.9  GLUCOSE 95    Imaging/Diagnostic Tests: No new imaging in last 24 hours    Simmons-Robinson, Riki Sheer, MD 09/05/2021, 6:55 AM PGY-3, [place] Intern pager: [phone], text pages welcome

## 2021-09-05 NOTE — Progress Notes (Signed)
FPTS Brief Note Reviewed patient's vitals, recent notes.  Vitals:   09/05/21 2034 09/05/21 2303  BP: (!) 158/66 (!) 144/70  Pulse: 97 100  Resp: 20 20  Temp: 97.9 F (36.6 C) 97.8 F (36.6 C)  SpO2: 97% 97%   At this time, no change in plan from day progress note.  Gerlene Fee, DO Page [phone] with questions about this patient.

## 2021-09-06 ENCOUNTER — Inpatient Hospital Stay (HOSPITAL_COMMUNITY): Payer: Medicare HMO

## 2021-09-06 DIAGNOSIS — R079 Chest pain, unspecified: Secondary | ICD-10-CM

## 2021-09-06 LAB — ECHOCARDIOGRAM COMPLETE
Area-P 1/2: 3.53 cm2
Calc EF: 66.2 %
Height: 68 in
S' Lateral: 2.9 cm
Single Plane A2C EF: 64.8 %
Single Plane A4C EF: 70.5 %
Weight: 3749.58 oz

## 2021-09-06 LAB — TROPONIN I (HIGH SENSITIVITY)
Troponin I (High Sensitivity): 5 ng/L (ref <18)
Troponin I (High Sensitivity): 6 ng/L (ref <18)

## 2021-09-06 MED ORDER — ENOXAPARIN SODIUM 40 MG/0.4ML IJ SOSY
40.0000 mg | PREFILLED_SYRINGE | Freq: Every day | INTRAMUSCULAR | Status: DC
  Administered 2021-09-06: 10:00:00 40 mg via SUBCUTANEOUS
  Filled 2021-09-06: qty 0.4

## 2021-09-06 MED ORDER — ENOXAPARIN SODIUM 120 MG/0.8ML IJ SOSY
1.0000 mg/kg | PREFILLED_SYRINGE | Freq: Two times a day (BID) | INTRAMUSCULAR | Status: DC
  Administered 2021-09-06 – 2021-09-07 (×2): 105 mg via SUBCUTANEOUS
  Filled 2021-09-06 (×3): qty 0.7

## 2021-09-06 NOTE — Progress Notes (Addendum)
FPTS Brief Progress Note  S:Sitting in beside chair.    O: BP 138/72 (BP Location: Right Arm)    Pulse 80    Temp 97.8 F (36.6 C) (Oral)    Resp 20    Ht 5\' 8"  (1.727 m)    Wt 106.3 kg    SpO2 97%    BMI 35.63 kg/m   General: Appears well, no acute distress. Age appropriate. Respiratory: normal effort Neuro: alert   A/P: Chest Pain   SOB  Dementia  MDD - Orders reviewed. Labs for AM ordered, which was adjusted as needed.  - Reviewed ECHO - CT 12/20 - If patient becomes agitated can increase Seroquel to 100 mg   Gerlene Fee, DO 09/06/2021, 10:36 PM PGY-3, [facility] Night Resident  Please page [phone] with questions.

## 2021-09-06 NOTE — Progress Notes (Signed)
We were contacted by nursing that the pt was trying to get on the elevator and leave. He was fairly agitated once I arrived and ultimately was able to be redirected to his room with coaxing but no restraint. He was still agitated and stated that "he will go lay down on the train tracks" and that he "wants to kill his cousin." Because of this, we will continue with suicide precautions and a sitter. Unfortunately, he has declined the CTA PE because "his chest is fine" and he wanted his IV out. We took the IV out per his request and will continue monitoring his mental status. Currently, on Seroquel 75 mg and can consider going to 100 mg if pt continues to be agitated and becomes aggressive. Will attempt to approach the CTA tomorrow.

## 2021-09-06 NOTE — Progress Notes (Signed)
MD on call notified that the patient was stating that he was dizzy with 10/10 chest pain radiating from front of chest  through left side of ribs and to the mid upper back.  EKG was obtained.  MD came to the bedside to assess.  Will follow any orders and continue to monitor patient.

## 2021-09-06 NOTE — Progress Notes (Signed)
ANTICOAGULATION CONSULT NOTE - Initial Consult  Pharmacy Consult for Lovenox Indication: chest pain/ACS and pulmonary embolus  No Known Allergies  Patient Measurements: Height: 5\' 8"  (172.7 cm) Weight: 106.3 kg (234 lb 5.6 oz) IBW/kg (Calculated) : 68.4 Heparin Dosing Weight: 105 kg  Vital Signs: Temp: 98.5 F (36.9 C) (12/19 0732) Temp Source: Oral (12/19 0732) Pulse Rate: 72 (12/19 0732)  Labs: Recent Labs    09/06/21 0020 09/06/21 0159  TROPONINIHS 5 6    Estimated Creatinine Clearance: 100.7 mL/min (by C-G formula based on SCr of 0.83 mg/dL).   Medical History: Past Medical History:  Diagnosis Date   Cataract    Mixed form OU   Chest pain 02/26/2021   Diabetes mellitus without complication ([place])    Diabetic retinopathy ([place])    PDR OU   Hypertension    Hypertensive retinopathy    OU   Lacunar infarction [facility])    Brain MRI  03/2021: small remote right cerebellar lacunar infarct   Pain, dental 11/20/2019   Skin lesions, generalized 05/09/2018   Tendinopathy of left rotator cuff 04/29/2019    Assessment: Patient admitted with sudden onset of chest pain and shortness of breath. CTA was scheduled but patient declined.   Goal of Therapy:  Monitor platelets by anticoagulation protocol: Yes   Plan:  Lovenox 105 mg [place] q12h  Ronak Duquette BS, PharmD, BCPS Clinical Pharmacist 09/06/2021,6:59 PM

## 2021-09-06 NOTE — Progress Notes (Signed)
Patient attempting to leave off unit, adamant he was leaving, made it onto the elevator with staff holding elevator open. Security was called and arrived for support, MD notified. MD came to bedside. Charles Fox was called and notified of patient's behaviors as well. Patient was able to be coaxed back to his room with staff talking with him.

## 2021-09-06 NOTE — Progress Notes (Addendum)
Family Medicine Teaching Service Daily Progress Note Intern Pager: [phone]  Patient name: Charles Fox Medical record number: [id number] Date of birth: 1950-12-28 Age: 70 y.o. Gender: male  Primary Care Provider: [name], MD Consultants: None  Code Status: Full   [name]:  8/19: Admitted for altered mental status and hematuria 8/20: Normal brain MRI improved mentation 8/21: MoCA 12/30, determined to not have capacity 8/22: Discussion with son revealed patient does not have a safe place for discharge 8/23: PSA elevated.  Urology recommending keeping catheter and rechecking PSA in 1 month 8/24: Voiding trial  8/25: Suicide precautions, voiding trial failed, Foley reinserted 8/26: Suicide precautions removed 8/29 Agitated and combative, delirium precautions in place 9/5: AKI 9/9: AKI resolved 9/18: Foley removed 9/30: Reported SI, placed on suicide precautions with sitter 10/1: Suicide precautions removed 10/3: Self reported fall 10/20: New guardian: [name] 10/21: Guardianship hearing, granted guardianship over person (not estate) 10/25: Informed by SW that APS worker applying for medicaid  11/3: Flu vaxx with guardian consent  11/6: Suicide precautions, MoCA 8/30 11/9: Suicide precautions removed 11/19: Contacted liaison for [place] [employer] 11/22: Visited by [employer] 11/30: Court date for guardianship of estate 12/1: COVID ([employer])-vaxx with guardian consent 12/7: TB skin test negative 12/15: Awaiting financial POA to obtain court orders, waiting for this per SW   Assessment and Plan:  Charles Fox is a 70 yo M presenting with hematuria and AMS originally in August that is now resolved. PMHx significant for DM2, HTN, HLD, dementia, H/o CVA, housing insecurity.    Chest Pain   SOB  Pt had presence of new onset chest pain and SOB overnight with associated dizziness. Pain initially was sharp and associated with squeezing in  nature. Night team trended trops that were flat and EKG was obtained that showed NSR (personally reviewed this morning). Will assess cardiac function with updated echocardiogram. Lovenox had been previously discontinued when assessing risk versus benefit of prolonged stay. Therefore, will continue with CTA PE and restart Lovenox.  -- Echo ordered  -- Lovenox restarted - Awaiting CTA PE - Can discontinue telemetry monitoring - Pending CTPE and labs, will discuss next steps. If recurs, will consult Cardiology.   Dizziness Patient reports that his dizziness is now resolved. Positive orthostatics. D/c'ed [place] for tomorrow and we are getting updated CBC/BMP for tomorrow to evaluate for possible anemia.  -CBC/BMP tomorrow  -Losartan d/c tomorrow   Cognitive Impairment  Secondary to mood disorder and dementia as well as housing insecurity.  Awaiting financial guardian to receive information in order before moving forward with placement. - Following up with social work - Continue delirium and fall precautions  Hematuria, resolved previously elevated PSA - downtrending on repeat in September, outpatient urology follow up   BPH  Finasteride 5 mg daily  HTN  Continue losartan 50 mg daily  Dementia  MDD Celexa 20 mg daily, Seroquel 75 mg nightly.  Repeat EKG shows normal sinus rhythm.  Melatonin 5 mg nightly.   FEN/GI: Regular  PPx: Lovenox Dispo: Likely [facility]   pending SW follow up . Barriers include CTA PE results.   Subjective:  Pt denies current SOB or CP. He is eating breakfast and dislikes the tele monitoring.   Objective: Temp:  [97.6 F (36.4 C)-98.6 F (37 C)] 98.5 F (36.9 C) (12/19 0732) Pulse Rate:  [72-100] 72 (12/19 0732) Resp:  [13-20] 16 (12/19 0732) BP: (113-158)/(49-70) 113/64 (12/19 0512) SpO2:  [97 %-  100 %] 99 % (12/19 0732) Physical Exam: General: Sitting up at bedside  Cardiovascular: RRR, no m/g/r Respiratory: CTAB Abdomen: Nontender   Extremities: FROM   Laboratory: Recent Labs  Lab 09/03/21 0141  WBC 8.8  HGB 11.4*  HCT 35.0*  PLT 255   Recent Labs  Lab 09/03/21 0141  NA 137  K 4.1  CL 104  CO2 27  BUN 14  CREATININE 0.83  CALCIUM 8.9  GLUCOSE 95      Erskine Emery, MD 09/06/2021, 12:15 PM PGY-1, [place] Intern pager: [phone], text pages welcome

## 2021-09-06 NOTE — Progress Notes (Signed)
°  Echocardiogram 2D Echocardiogram has been performed.  Bobbye Charleston 09/06/2021, 3:46 PM

## 2021-09-06 NOTE — Progress Notes (Signed)
FPTS Brief Progress Note  S: Charles Fox was seen this p.m., after being paged for 10/10 left-sided chest pain at rest with associating SOB and dizziness.  Patient stated that his chest pain was sharp and squeezing in nature, that lasted for about 20-30 minutes.  Pain was not reproducible, however when patient leaned over, stated that he felt like he was going to pass out.   O: BP (!) 144/70 (BP Location: Left Arm)    Pulse 100    Temp 97.8 F (36.6 C) (Oral)    Resp 20    Ht 5\' 8"  (1.727 m)    Wt 106.3 kg    SpO2 97%    BMI 35.63 kg/m   Physical Exam Constitutional:      General: He is awake.     Appearance: He is not ill-appearing, toxic-appearing or diaphoretic.  Cardiovascular:     Rate and Rhythm: Normal rate and regular rhythm.     Heart sounds: No murmur heard.   No friction rub. No gallop.  Pulmonary:     Effort: Pulmonary effort is normal. No tachypnea or respiratory distress.     Breath sounds: Normal breath sounds. No decreased air movement. No decreased breath sounds, wheezing, rhonchi or rales.  Chest:     Chest wall: No tenderness.  Abdominal:     Palpations: Abdomen is soft.     Tenderness: There is no abdominal tenderness. There is no guarding or rebound.  Musculoskeletal:     Right lower leg: No edema.     Left lower leg: No edema.  Skin:    General: Skin is warm and dry.  Neurological:     Mental Status: He is alert. Mental status is at baseline.  Psychiatric:        Behavior: Behavior is cooperative.     A/P: Charles Fox is a 70 y.o. male presented with Hematuria and Altered Mental Status in August now resolved and stable for DC awaiting safe discharge plan due to dementia. PMHx of PMHx of T2DM, HTN, HLD, dementia, hx of CVA, housing and security.   Atypical chest pain EKG showed NSR and no ischemic changes. CXR had no acute changes.  Serial troponins showed 5, second is pending. Also considered PE, as patient's SPO2  94% on room air.  We will consider  D-dimer. Follow-up serial troponins We will continue to monitor 24-hour telemetry 24-hour continuous pulse ox  Orthostatics SBP 158 to136 from lying to standing, patient reported dizziness.  Patient endorsed good p.o. intake. Fall precautions  - Orders reviewed. Labs for AM ordered, which was adjusted as needed.   Remainder of plan per day team/daily progress note.  [name], DO 09/06/2021, 2:29 AM PGY-1, [facility] Night Resident  Please page [phone] with questions.

## 2021-09-07 ENCOUNTER — Inpatient Hospital Stay (HOSPITAL_COMMUNITY): Payer: Medicare HMO

## 2021-09-07 LAB — CBC
HCT: 31.9 % — ABNORMAL LOW (ref 39.0–52.0)
Hemoglobin: 10.4 g/dL — ABNORMAL LOW (ref 13.0–17.0)
MCH: 30.6 pg (ref 26.0–34.0)
MCHC: 32.6 g/dL (ref 30.0–36.0)
MCV: 93.8 fL (ref 80.0–100.0)
Platelets: 225 10*3/uL (ref 150–400)
RBC: 3.4 MIL/uL — ABNORMAL LOW (ref 4.22–5.81)
RDW: 14.7 % (ref 11.5–15.5)
WBC: 7.9 10*3/uL (ref 4.0–10.5)
nRBC: 0 % (ref 0.0–0.2)

## 2021-09-07 LAB — BASIC METABOLIC PANEL
Anion gap: 8 (ref 5–15)
BUN: 16 mg/dL (ref 8–23)
CO2: 25 mmol/L (ref 22–32)
Calcium: 8.8 mg/dL — ABNORMAL LOW (ref 8.9–10.3)
Chloride: 102 mmol/L (ref 98–111)
Creatinine, Ser: 0.87 mg/dL (ref 0.61–1.24)
GFR, Estimated: >60 mL/min (ref >60)
Glucose, Bld: 269 mg/dL — ABNORMAL HIGH (ref 70–99)
Potassium: 3.6 mmol/L (ref 3.5–5.1)
Sodium: 135 mmol/L (ref 135–145)

## 2021-09-07 MED ORDER — ENOXAPARIN SODIUM 40 MG/0.4ML IJ SOSY: 40.0000 mg | PREFILLED_SYRINGE | Freq: Two times a day (BID) | INTRAMUSCULAR | Status: DC

## 2021-09-07 MED ORDER — IOHEXOL 350 MG/ML SOLN
100.0000 mL | Freq: Once | INTRAVENOUS | Status: AC | PRN
  Administered 2021-09-07: 14:00:00 100 mL via INTRAVENOUS

## 2021-09-07 MED ORDER — LORAZEPAM 1 MG PO TABS
1.0000 mg | ORAL_TABLET | Freq: Once | ORAL | Status: AC | PRN
  Administered 2021-09-07: 13:00:00 1 mg via ORAL
  Filled 2021-09-07: qty 1

## 2021-09-07 MED ORDER — ENOXAPARIN SODIUM 40 MG/0.4ML IJ SOSY
40.0000 mg | PREFILLED_SYRINGE | INTRAMUSCULAR | Status: DC
  Administered 2021-09-08 – 2021-09-22 (×15): 40 mg via SUBCUTANEOUS
  Filled 2021-09-07 (×15): qty 0.4

## 2021-09-07 NOTE — Progress Notes (Addendum)
Family Medicine Teaching Service Daily Progress Note Intern Pager: [phone]  Patient name: Charles Fox Medical record number: [id number] Date of birth: 05/25/1951 Age: 70 y.o. Gender: male  Primary Care Provider: [name], MD Consultants: None  Code Status: Full   [name]:  8/19: Admitted for altered mental status and hematuria 8/20: Normal brain MRI improved mentation 8/21: MoCA 12/30, determined to not have capacity 8/22: Discussion with son revealed patient does not have a safe place for discharge 8/23: PSA elevated.  Urology recommending keeping catheter and rechecking PSA in 1 month 8/24: Voiding trial  8/25: Suicide precautions, voiding trial failed, Foley reinserted 8/26: Suicide precautions removed 8/29 Agitated and combative, delirium precautions in place 9/5: AKI 9/9: AKI resolved 9/18: Foley removed 9/30: Reported SI, placed on suicide precautions with sitter 10/1: Suicide precautions removed 10/3: Self reported fall 10/20: New guardian: [name] 10/21: Guardianship hearing, granted guardianship over person (not estate) 10/25: Informed by SW that APS worker applying for medicaid  11/3: Flu vaxx with guardian consent  11/6: Suicide precautions, MoCA 8/30 11/9: Suicide precautions removed 11/19: Contacted liaison for [place] [employer] 11/22: Visited by [facility] [employer] 11/30: Court date for guardianship of estate 12/1: COVID ([employer])-vaxx with guardian consent 12/7: TB skin test negative 12/15: Awaiting financial POA to obtain court orders, waiting for this per SW  12/20: CTA PE today    Assessment and Plan:   [name] is a 70 yo M presenting with hematuria and AMS originally in August that is now resolved. PMHx significant for DM2, HTN, HLD, dementia, H/o CVA, housing insecurity.   Assessment and Plan:  [name] is a 70 yo M presenting with hematuria and AMS originally in August that is now resolved. PMHx  significant for DM2, HTN, HLD, dementia, H/o CVA, housing insecurity.   Chest Pain   SOB This has now resolved. He still had sudden onset a couple of days ago of SOB and chest pain that has warranted a work up with EKG and trop. Echo LVEF 60-65% with mild aortic dilation. CTA PE pending. Patient is amenable to continuing with the CTA PE. Ativan PRN provided to assist with agitation. Additionally, he is on treatment Lovenox dose, will decrease to therapeutic.  -- CTA PE today  -- Decrease to therapeutic Lovenox dose after CTA   Normocytic Anemia  Hgb 10.4 from 11.4. Baseline previously appeared to be 13-14. Will obtain ferritin to assess for IDA.  --Ferritin ordered   Dizziness, resolved  Orthostatics were positive. Dizziness has now resolved. Will obtain new orthostatics.  -- Repeat orthostatics ordered   Cognitive Impairment  This is secondary to mood disorder and dementia as well as housing insecurity.  Patient demonstrated SI and HI yesterday with an episode of agitation that was ultimately de-escalated.  This morning he denies any SI or HI. - Discontinue suicide precautions - Discontinue sitter - Continue delirium and fall precautions - Continue to work with social work  Hematuria, resolved previously elevated PSA - Downtrending on repeat in September, will call urology for outpatient follow-up once patient is discharged  BPH Continue finasteride 5 mg daily  HTN  Continue losartan 50 mg daily  Dementia   MDD   Mood change  Celexa 20 mg daily, Seroquel 75 mg nightly, melatonin 5 mg nightly.  Can consider increasing Seroquel to 100 mg if patient becomes more agitated throughout the night.  FEN/GI: Regular PPx: Lovenox Dispo: Likely [facility] pending social work follow-up.  Barriers include CTAP.  Subjective:  Pt is doing well this morning and denies SI/HI. Denies SOB or CP.   Objective: Temp:  [97.5 F (36.4 C)-98.4 F (36.9 C)] 97.7 F (36.5 C) (12/20 0841) Pulse  Rate:  [71-85] 71 (12/20 0841) Resp:  [17-20] 20 (12/20 0841) BP: (115-159)/(53-75) 140/69 (12/20 0841) SpO2:  [96 %-100 %] 96 % (12/20 0841) Physical Exam: General: NAD, sitting at bedside  Cardiovascular: RRR Respiratory: CTAB Abdomen: Nontender  Extremities: FROM   Laboratory: Recent Labs  Lab 09/03/21 0141 09/07/21 0140  WBC 8.8 7.9  HGB 11.4* 10.4*  HCT 35.0* 31.9*  PLT 255 225   Recent Labs  Lab 09/03/21 0141 09/07/21 0140  NA 137 135  K 4.1 3.6  CL 104 102  CO2 27 25  BUN 14 16  CREATININE 0.83 0.87  CALCIUM 8.9 8.8*  GLUCOSE 95 269*    ECHOCARDIOGRAM COMPLETE  Result Date: 09/06/2021  Left Ventricle: Left ventricular ejection fraction, by estimation, is 60 to 65%. The left ventricle has normal function. The left ventricle has no regional wall motion abnormalities. The left ventricular internal cavity size was normal in size. There is  mild left ventricular hypertrophy. Left ventricular diastolic parameters were normal. Right Ventricle: The right ventricular size is normal. No increase in right ventricular wall thickness. Right ventricular systolic function is normal. There is normal pulmonary artery systolic pressure. The tricuspid regurgitant velocity is 2.41 m/s, and  with an assumed right atrial pressure of 3 mmHg, the estimated right ventricular systolic pressure is 46.5 mmHg. Left Atrium: Left atrial size was normal in size. Right Atrium: Right atrial size was normal in size. Pericardium: There is no evidence of pericardial effusion. Mitral Valve: The mitral valve is normal in structure. No evidence of mitral valve regurgitation. No evidence of mitral valve stenosis. Tricuspid Valve: The tricuspid valve is normal in structure. Tricuspid valve regurgitation is trivial. Aortic Valve: The aortic valve is tricuspid. Aortic valve regurgitation is trivial. Aortic valve sclerosis/calcification is present, without any evidence of aortic stenosis. Pulmonic Valve: The  pulmonic valve was not well visualized. Pulmonic valve regurgitation is not visualized. Aorta: Aortic dilatation noted. 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 Venous: The inferior vena cava is normal in size with greater than 50% respiratory variability, suggesting right atrial pressure of 3 mmHg. IAS/Shunts: The interatrial septum was not well visualized.  LEFT    Erskine Emery, MD 09/07/2021, 12:08 PM PGY-1, [place] Intern pager: [phone], text pages welcome

## 2021-09-07 NOTE — Progress Notes (Signed)
CSW spoke with Mr. Simone Curia to discuss patient. Mr. Simone Curia states he still does not have orders from the [place] to serve as this patient's GOE. Mr. Simone Curia discussed patient's need for solidified financial information for discharge planning. CSW patient's FL2 and guardianship paperwork over for review.  [name], MSW, LCSW Transitions of Care   Clinical Social Worker II [phone]

## 2021-09-07 NOTE — Progress Notes (Signed)
FPTS Brief Note Reviewed patient's vitals, recent notes.  Vitals:   09/07/21 1407 09/07/21 2056  BP: (!) 167/66 (!) 175/75  Pulse: 85 87  Resp:  20  Temp: 97.7 F (36.5 C) 98 F (36.7 C)  SpO2: 96% 99%   At this time, no change in plan from day progress note.  Gerlene Fee, DO Page [phone] with questions about this patient.

## 2021-09-08 DIAGNOSIS — I152 Hypertension secondary to endocrine disorders: Secondary | ICD-10-CM

## 2021-09-08 DIAGNOSIS — E1159 Type 2 diabetes mellitus with other circulatory complications: Secondary | ICD-10-CM

## 2021-09-08 MED ORDER — LOSARTAN POTASSIUM 50 MG PO TABS
50.0000 mg | ORAL_TABLET | Freq: Every day | ORAL | Status: DC
  Administered 2021-09-08 – 2021-09-15 (×8): 50 mg via ORAL
  Filled 2021-09-08 (×8): qty 1

## 2021-09-08 NOTE — Progress Notes (Signed)
Family Medicine Teaching Service Daily Progress Note Intern Pager: [phone]  Patient name: Charles Fox Medical record number: [id number] Date of birth: 16-Oct-1950 Age: 70 y.o. Gender: male  Primary Care Provider: [name], MD Consultants: None  Code Status: Full   [name]:  8/19: Admitted for altered mental status and hematuria 8/20: Normal brain MRI improved mentation 8/21: MoCA 12/30, determined to not have capacity 8/22: Discussion with son revealed patient does not have a safe place for discharge 8/23: PSA elevated.  Urology recommending keeping catheter and rechecking PSA in 1 month 8/24: Voiding trial  8/25: Suicide precautions, voiding trial failed, Foley reinserted 8/26: Suicide precautions removed 8/29 Agitated and combative, delirium precautions in place 9/5: AKI 9/9: AKI resolved 9/18: Foley removed 9/30: Reported SI, placed on suicide precautions with sitter 10/1: Suicide precautions removed 10/3: Self reported fall 10/20: New guardian: [name] 10/21: Guardianship hearing, granted guardianship over person (not estate) 10/25: Informed by SW that APS worker applying for medicaid  11/3: Flu vaxx with guardian consent  11/6: Suicide precautions, MoCA 8/30 11/9: Suicide precautions removed 11/19: Contacted liaison for [place] [employer] 11/22: Visited by  [employer] 11/30: Court date for guardianship of estate 12/1: COVID (Pf[employer])-vaxx with guardian consent 12/7: TB skin test negative 12/15: Awaiting financial POA to obtain court orders, waiting for this per SW  12/20: CTA PE today   Assessment and Plan:  [name] is a 70 yo M presenting with hematuria and AMS originally in August that is now resolved. PMHx significant for DM2, HTN, HLD, dementia, H/o CVA, housing insecurity.   Chest Pain   SOB, all resolved  CTA PE negative.  Restarted Lovenox 40 mg.  Patient is doing quite well today.  Hemodynamically stable no new  labs. - Continue to monitor  Normocytic Anemia  No new labs today, hemoglobin 10.4.  Unable to obtain ferritin.  Likely will need ferritin added to next set of labs.  Dizziness Intermittently dizzy when standing, could be partially attributed to anemia. No nystagmus on exam this morning. Patient had a positive orthostatics initially. -Still awaiting new orthostatics   Cognitive impairment Secondary to mood disorder and dementia as well as housing insecurity.  He no longer has SI or HI and has had sitter and suicide precautions discontinued.  Continues to do well from a mood standpoint. - Continue delirium and fall precautions - Continue to work with social work  Hematuria, resolved previously elevated PSA - Will call urology for outpatient follow-up once patient is discharged  BPH Continue finasteride 5 mg daily  Hypertension Several elevated pressures after holding losartan. Losartan 50 mg daily added back to medications.   Dementia  MDD  mood change Celexa 20 mg daily, Seroquel 75 mg nightly, melatonin 5 mg nightly.  Increase Seroquel to 100 mg if patient becomes agitated throughout the night.   FEN/GI: Regular PPx: Lovenox Dispo: Likely [facility] pending social work follow-up.  Barriers include placement.  Subjective:  Patient is doing well this morning and denies shortness of breath or chest pain.  Objective: Temp:  [97.7 F (36.5 C)-98 F (36.7 C)] 98 F (36.7 C) (12/20 2056) Pulse Rate:  [85-87] 87 (12/20 2056) Resp:  [20] 20 (12/20 2056) BP: (167-175)/(66-75) 175/75 (12/20 2056) SpO2:  [96 %-99 %] 99 % (12/20 2056) Weight:  [113.1 kg] 113.1 kg (12/21 0546) Physical Exam: General: NAD, resting in bed  Cardiovascular: RRR Respiratory: CTAB Abdomen: nontender  Extremities: FROM   Laboratory:  Recent Labs  Lab 09/03/21 0141 09/07/21 0140  WBC 8.8 7.9  HGB 11.4* 10.4*  HCT 35.0* 31.9*  PLT 255 225   Recent Labs  Lab 09/03/21 0141 09/07/21 0140   NA 137 135  K 4.1 3.6  CL 104 102  CO2 27 25  BUN 14 16  CREATININE 0.83 0.87  CALCIUM 8.9 8.8*  GLUCOSE 95 269*    CTA PE: No PE   Erskine Emery, MD 09/08/2021, 11:02 AM PGY-1, [place] Intern pager: [phone], text pages welcome

## 2021-09-08 NOTE — Progress Notes (Signed)
FPTS Brief Note Reviewed patient's vitals, recent notes.  Vitals:   09/07/21 2056 09/08/21 1300  BP: (!) 175/75 116/87  Pulse: 87 84  Resp: 20 18  Temp: 98 F (36.7 C) 98.3 F (36.8 C)  SpO2: 99% 99%   At this time, no change in plan from day progress note.  Gerlene Fee, DO Page [phone] with questions about this patient.

## 2021-09-09 NOTE — Progress Notes (Addendum)
Family Medicine Teaching Service Daily Progress Note Intern Pager: [phone]  Patient name: Charles Fox Medical record number: [id number] Date of birth: October 31, 1950 Age: 70 y.o. Gender: male  Primary Care Provider: [name], MD Consultants: None  Code Status: Full   [name]:  8/19: Admitted for altered mental status and hematuria 8/20: Normal brain MRI improved mentation 8/21: MoCA 12/30, determined to not have capacity 8/22: Discussion with son revealed patient does not have a safe place for discharge 8/23: PSA elevated.  Urology recommending keeping catheter and rechecking PSA in 1 month 8/24: Voiding trial  8/25: Suicide precautions, voiding trial failed, Foley reinserted 8/26: Suicide precautions removed 8/29 Agitated and combative, delirium precautions in place 9/5: AKI 9/9: AKI resolved 9/18: Foley removed 9/30: Reported SI, placed on suicide precautions with sitter 10/1: Suicide precautions removed 10/3: Self reported fall 10/20: New guardian: Charles Fox 10/21: Guardianship hearing, granted guardianship over person (not estate) 10/25: Informed by SW that APS worker applying for medicaid  11/3: Flu vaxx with guardian consent  11/6: Suicide precautions, MoCA 8/30 11/9: Suicide precautions removed 11/19: Contacted liaison for [place] [employer] 11/22: Visited by [facility] [employer] 11/30: Court date for guardianship of estate 12/1: COVID (Pf[employer])-vaxx with guardian consent 12/7: TB skin test negative 12/15: Awaiting financial POA to obtain court orders, waiting for this per SW  12/20: CTA PE today   Assessment and Plan:  Charles Fox is a 70 yo M presenting with hematuria and AMS originally in August that is now resolved. PMHx significant for DM2, HTN, HLD, dementia, H/o CVA, housing insecurity.   Chest Pain, resolved   SOB, resolved  Patient had full work-up for chest pain and shortness of breath that has since resolved.  All work-up  was negative.  Patient was placed back on Lovenox for prophylaxis. - Continue Lovenox    Normocytic Anemia  Monitoring with weekly CBCs. Last Hgb 10.4>11.4. - Ferritin should be added to next lab work  Dizziness, resolved  Intermittent dizziness with positive orthostatics. Pt has no dizziness this morning.   Cognitive Impairment  Patient still has no SI or HI and is doing quite well regarding his mood this morning. - Continue delirium and fall precautions - Continue to work with social work for placement  Hematuria, resolved previously elevated PSA  Will follow-up with urology to see him outpatient once he is discharged  BPH  Continue finasteride 5 mg daily  HTN  Continue losartan 50 mg daily. Last BP: 128/73.   Dementia   MDD   Mood change  Celexa 20 mg daily, Seroquel 75 mg nightly, melatonin 5 mg nightly.  May increase Seroquel to 100 mg if patient has another episode of agitation throughout the night.   FEN/GI: Regular  PPx: Lovenox  Dispo:SNF  pending placement . Barriers include placement.   Subjective:  Pt reports that he is doing well this morning but did not enjoy his breakfast.   Objective: Temp:  [97.6 F (36.4 C)-98.3 F (36.8 C)] 97.6 F (36.4 C) (12/22 0822) Pulse Rate:  [77-84] 77 (12/22 0822) Resp:  [17-19] 19 (12/22 0822) BP: (116-165)/(73-102) 128/73 (12/22 0822) SpO2:  [99 %] 99 % (12/22 4536) Physical Exam: General: NAD, sitting at bedside  Cardiovascular: RRR Respiratory: CTAB Abdomen: nontender  Extremities: FROM   Laboratory: Recent Labs  Lab 09/03/21 0141 09/07/21 0140  WBC 8.8 7.9  HGB 11.4* 10.4*  HCT 35.0* 31.9*  PLT 255 225   Recent Labs  Lab 09/03/21 0141 09/07/21 0140  NA 137 135  K 4.1 3.6  CL 104 102  CO2 27 25  BUN 14 16  CREATININE 0.83 0.87  CALCIUM 8.9 8.8*  GLUCOSE 95 269Erskine Emery, MD 09/09/2021, 12:03 PM PGY-1, [place] Intern pager: [phone], text pages  welcome

## 2021-09-09 NOTE — Progress Notes (Addendum)
FPTS Brief Note Reviewed patient's vitals, recent notes.  Vitals:   09/09/21 0822 09/09/21 2008  BP: 128/73 (!) 177/96  Pulse: 77 89  Resp: 19 20  Temp: 97.6 F (36.4 C) 97.7 F (36.5 C)  SpO2: 99% 97%   At this time, no change in plan from day progress note.  Gerlene Fee, DO Page [phone] with questions about this patient.

## 2021-09-10 NOTE — Progress Notes (Signed)
FPTS Brief Note Reviewed patient's vitals, recent notes.  Vitals:   09/10/21 0811 09/10/21 1314  BP: (!) 157/80 (!) 142/70  Pulse: 71   Resp: 19   Temp: (!) 97.3 F (36.3 C) 97.7 F (36.5 C)  SpO2: 96%    At this time, no change in plan from day progress note. BP slightly elevated, will re-check. Continue with Losartan 50mg  for now. Orvis Brill, DO Page [phone] with questions about this patient.

## 2021-09-10 NOTE — Progress Notes (Signed)
Family Medicine Teaching Service Daily Progress Note Intern Pager: [phone]  Patient name: Charles Fox Medical record number: [id number] Date of birth: Sep 02, 1951 Age: 70 y.o. Gender: male  Primary Care Provider: [name], MD Consultants: None Code Status: Full  [name]:  8/19: Admitted for altered mental status and hematuria 8/20: Normal brain MRI improved mentation 8/21: MoCA 12/30, determined to not have capacity 8/22: Discussion with son revealed patient does not have a safe place for discharge 8/23: PSA elevated.  Urology recommending keeping catheter and rechecking PSA in 1 month 8/24: Voiding trial  8/25: Suicide precautions, voiding trial failed, Foley reinserted 8/26: Suicide precautions removed 8/29 Agitated and combative, delirium precautions in place 9/5: AKI 9/9: AKI resolved 9/18: Foley removed 9/30: Reported SI, placed on suicide precautions with sitter 10/1: Suicide precautions removed 10/3: Self reported fall 10/14: Lovenox DC'd and patient on SCDs 10/20: New guardian: [name] 10/21: Guardianship hearing, granted guardianship over person (not estate) 10/25: Informed by SW that APS worker applying for medicaid  11/3: Flu vaxx with guardian consent  11/6: Suicide precautions, MoCA 8/30 11/9: Suicide precautions removed 11/19: Contacted liaison for [facility] [employer] 11/22: Visited by [facility] [employer] 11/30: Court date for guardianship of estate 12/1: COVID ([employer])-vaxx with guardian consent 12/7: TB skin test negative 12/15: Awaiting financial POA to obtain court orders, waiting for this per SW  12/19: Acute dyspnea and chest pain, restarted Lovenox 12/20: CTA PE negative  Assessment and Plan: Charles Fox is a 70 yo M presenting with hematuria and AMS originally in August that is now resolved. PMHx significant for DM2, HTN, HLD, dementia, H/o CVA, housing insecurity.  Normocytic Anemia  Consider weekly CBC.  Currently stable, will add a ferritin to next lab draw. - Consider weekly CBC monitoring - Ferritin at next blood draw  Cognitive Impairment - Delirium and fall precautions continued - Continued work with SW for placement  HTN BP intermittently elevated, recently started back on ARB, will continue to monitor. - Continue Carvedilol 3.125mg , Losartan 50mg  - Continue to monitor BP with vital checks   Dementia   MDD   Mood change Chronic, stable. - Continue Celexa 20mg  daily, Seroquel 75mg  QHS, melatonin 5mg  QHS. - If acute agitation episodes occur, may consider increasing seroquel to 100mg .    FEN/GI: Regular  PPx: Lovenox Dispo:SNF pending placement and guardianship paperwork.  Subjective:  Patient reports that he would still like to go outside today.  Otherwise he has no complaints or concerns.  He states he has no pain anywhere and no shortness of breath or chest pain.  Objective: Temp:  [97.3 F (36.3 C)-97.7 F (36.5 C)] 97.3 F (36.3 C) (12/23 0811) Pulse Rate:  [71-89] 71 (12/23 0811) Resp:  [19-20] 19 (12/23 0811) BP: (157-177)/(80-96) 157/80 (12/23 0811) SpO2:  [96 %-97 %] 96 % (12/23 0811) Physical Exam: General: Sitting up in bed, RN in the room, NAD Cardiovascular: RRR Respiratory: Breathing abdomen room air, no increased work breathing, CTAB Abdomen: Soft, nondistended Extremities: Moving all extremities equally and appropriately  Laboratory: Recent Labs  Lab 09/07/21 0140  WBC 7.9  HGB 10.4*  HCT 31.9*  PLT 225   Recent Labs  Lab 09/07/21 0140  NA 135  K 3.6  CL 102  CO2 25  BUN 16  CREATININE 0.87  CALCIUM 8.8*  GLUCOSE 269*     Imaging/Diagnostic Tests: No results found.   Rise Patience, DO 09/10/2021, 9:11 AM PGY-2, [place]  [place] Intern pager: [phone], text pages welcome

## 2021-09-10 NOTE — Progress Notes (Addendum)
Family Medicine Teaching Service Daily Progress Note Intern Pager: [phone]  Patient name: Charles Fox Medical record number: [id number] Date of birth: 1951-05-27 Age: 70 y.o. Gender: male  Primary Care Provider: [name], MD Consultants: Psychiatry, Urology Code Status: Full  [name]:  8/19: Admitted for altered mental status and hematuria 8/20: Normal brain MRI improved mentation 8/21: MoCA 12/30, determined to not have capacity 8/22: Discussion with son revealed patient does not have a safe place for discharge 8/23: PSA elevated.  Urology recommending keeping catheter and rechecking PSA in 1 month 8/24: Voiding trial  8/25: Suicide precautions, voiding trial failed, Foley reinserted 8/26: Suicide precautions removed 8/29 Agitated and combative, delirium precautions in place 9/5: AKI 9/9: AKI resolved 9/18: Foley removed 9/30: Reported SI, placed on suicide precautions with sitter 10/1: Suicide precautions removed 10/3: Self reported fall 10/20: New guardian: [name] 10/21: Guardianship hearing, granted guardianship over person (not estate) 10/25: Informed by SW that APS worker applying for medicaid  11/3: Flu vaxx with guardian consent  11/6: Suicide precautions, MoCA 8/30 11/9: Suicide precautions removed 11/19: Contacted liaison for [employer] 11/22: Visited by [employer] 11/30: Court date for guardianship of estate 12/1: [place] ([employer])-vaxx with guardian consent 12/7: TB skin test negative 12/15: Awaiting financial POA to obtain court orders, waiting for this per SW  12/20: CTA PE today   LOS: 123 days  Assessment and Plan: Charles Fox is a 70 year-old male who initially presented with hematuria and AMS, now awaiting SNF placement with significant barrier issues. PMH significant for T2DM, HTN, HLD, dementia, CVA, housing insecurity.  Cognitive impairment, Dementia, MDD, Mood Change At his baseline this  morning. - Celexa 20mg  daily, Seroquel 75mg  nightly, Melatonin 5mg  nightly. - Can consider increasing Seroquel 100mg  if worsening agitation - Continue delirium and fall precautions  Chest pain (resolved) CTA negative. No concern for acute ACS.  - Lovenox  Hematuria, elevated PSA -Plan for outpatient follow up with urology at discharge  BPH -Continue Finasteride 5mg  daily  HTN Elevated BP overnight, but mild and pt is asymptomatic. -Continue Losartan 50 mg daily. Last documented BP: 148/80  FEN/GI: Regular PPx: Lovenox Dispo:SNF  pending placement . Barriers include placement.   Subjective:  No complaints this morning- including CP or SOB or pain anwyhere. Asked about his blood sugar and wondered if they have been running high.  Objective: Temp:  [97.3 F (36.3 C)-97.7 F (36.5 C)] 97.7 F (36.5 C) (12/23 1314) Pulse Rate:  [71] 71 (12/23 0811) Resp:  [19] 19 (12/23 0811) BP: (142-157)/(70-80) 142/70 (12/23 1314) SpO2:  [96 %] 96 % (12/23 0811) Physical Exam: General: Laying in bed sleeping comfortably, no distress Cardiovascular: Regular rate and rhythm Respiratory: No increased work of breathing, lungs clear in all fields Abdomen: Soft, non-tender Extremities: Warm, well-perfused  Laboratory: Recent Labs  Lab 09/07/21 0140  WBC 7.9  HGB 10.4*  HCT 31.9*  PLT 225   Recent Labs  Lab 09/07/21 0140  NA 135  K 3.6  CL 102  CO2 25  BUN 16  CREATININE 0.87  CALCIUM 8.8*  GLUCOSE 269*    Imaging/Diagnostic Tests: No results found.   Orvis Brill, DO 09/10/2021, 9:18 PM PGY-1, [place] Intern pager: [phone], text pages welcome

## 2021-09-10 NOTE — Plan of Care (Signed)

## 2021-09-11 NOTE — Progress Notes (Signed)
FPTS Brief Note  In to visit patient with Dr. Jeani Hawking to spread holiday cheer. He was laying in bed, in no distress. He told us about his family and shared that one of his friends may visit tomorrow.  Reviewed patient's vitals, recent notes.  Vitals:   09/10/21 2305 09/11/21 0807  BP: (!) 148/80 (!) 147/79  Pulse: 88 87  Resp: 18 17  Temp: 98 F (36.7 C) 98.3 F (36.8 C)  SpO2: 97% 98%   At this time, no change in plan from day progress note.  Sharion Settler, DO Page [phone] with questions about this patient.

## 2021-09-12 NOTE — Plan of Care (Signed)

## 2021-09-12 NOTE — Progress Notes (Signed)
FPTS Brief Note Reviewed patient's vitals, recent notes.  Vitals:   09/12/21 1301 09/12/21 1959  BP: (!) 116/58 138/84  Pulse:  (!) 103  Resp:  20  Temp: 97.6 F (36.4 C) 98 F (36.7 C)  SpO2:  96%   At this time, no change in plan from day progress note.  Sharion Settler, DO Page [phone] with questions about this patient.

## 2021-09-12 NOTE — Progress Notes (Signed)
Family Medicine Teaching Service Daily Progress Note Intern Pager: [phone]  Patient name: Charles Fox Medical record number: [id number] Date of birth: March 21, 1951 Age: 70 y.o. Gender: male  Primary Care Provider: [name], MD Consultants: Psychiatry (s/o), Urology (s/o) Code Status: FULL  [name]:  8/19: Admitted for altered mental status and hematuria 8/20: Normal brain MRI improved mentation 8/21: MoCA 12/30, determined to not have capacity 10/3: Self reported fall 10/20: New guardian: [name] 10/21: Guardianship hearing, granted guardianship over person (not estate) 10/25: Informed by SW that APS worker applying for medicaid  11/3: Flu vaxx with guardian consent  11/30: Court date for guardianship of estate 12/1: [place] ([employer])-vaxx with guardian consent 12/15: Awaiting financial POA to obtain court orders, waiting for this per SW  12/20: CTA PE for concerns of chest pain; resulted negative   Assessment and Plan: Charles Fox is a 70 y.o. male with PMH of T2DM, HTN, HLD, dementia, housing insecurity, who was admitted in August for hematuria and AMS. The hematuria has since resolved and has remained hospitalized due to unsafe disposition. He is medically cleared for discharge to SNF.   Barriers to Discharge CSW on board to help assist with safe disposition to SNF. Note from 12/20 states that [name] is awaiting orders from the clerk to allow him to serve as the patients guardian of estate.  -F/u CSW, appreciate their assistance   Dementia   Depressed Mood: Chronic, stable  -Continue Celexa 20 mg  -Continue Seroquel 75 mg daily   BPH: Chronic, stable -Continue Finasteride 5 mg daily  -Continue tamsulosin 0.8 mg daily   HTN   HLD: Chronic, stable  -Continue Losartan 50 mg daily  -Continue Atorvastatin 40 mg daily   Hematuria (Resolved) Will need to f/u with Urology outpatient.    FEN/GI: Regular diet  PPx: Lovenox   Dispo: SNF, likely [facility]   when bed available . Barriers include awaiting court paperwork for guardian of estate and financial information- CSW on board and assisting with this process.   Subjective:  Patient sleeping comfortably, did not awaken this morning.   Objective: Temp:  [98.3 F (36.8 C)] 98.3 F (36.8 C) (12/24 0807) Pulse Rate:  [87] 87 (12/24 0807) Resp:  [17] 17 (12/24 0807) BP: (147)/(79) 147/79 (12/24 0807) SpO2:  [98 %] 98 % (12/24 0807) Physical Exam: General: Sleeping in bed, in NAD Respiratory: Normal work of breathing on room air, symmetric chest movement  Laboratory: Recent Labs  Lab 09/07/21 0140  WBC 7.9  HGB 10.4*  HCT 31.9*  PLT 225   Recent Labs  Lab 09/07/21 0140  NA 135  K 3.6  CL 102  CO2 25  BUN 16  CREATININE 0.87  CALCIUM 8.8*  GLUCOSE 269*    Imaging/Diagnostic Tests: No results found.   Sharion Settler, DO 09/12/2021, 12:03 AM PGY-2, [place] Intern pager: [phone], text pages welcome

## 2021-09-13 NOTE — Progress Notes (Signed)
Family Medicine Teaching Service Daily Progress Note Intern Pager: [phone]  Patient name: Charles Fox Medical record number: [id number] Date of birth: 08/03/1951 Age: 70 y.o. Gender: male  Primary Care Provider: [name], MD Consultants: Psychiatry [id number]), Urology (signed-off) Code Status: FULL  [name]:  8/19: Admitted for altered mental status and hematuria 8/20: Normal brain MRI improved mentation 8/21: MoCA 12/30, determined to not have capacity 10/3: Self reported fall 10/20: New guardian: Charles Fox 10/21: Guardianship hearing, granted guardianship over person (not estate) 10/25: Informed by SW that APS worker applying for medicaid  11/3: Flu vaxx with guardian consent  11/30: Court date for guardianship of estate 12/1: [place] ([employer])-vaxx with guardian consent 12/15: Awaiting financial POA to obtain court orders, waiting for this per SW  12/20: CTA PE for concerns of chest pain; resulted negative   Assessment and Plan: Charles Fox is a 70 year old male with PMH of T2DM, HTN, HLD, dementia, housing insecurity who initially presented in August 2022 for hematuria and AMS. He remains inpatient hospitalized due to barriers to safe disposition. He is medically stable for discharge to SNF.  Cognitive impairment, MDD, Mood change At his baseline mental status this morning. Pleasant and appropriate. No SI or HI. No agitation today or overnight. - Continue current medication regimen (Celexa 20mg  daily, Seroquel 75mg  nightly, Melatonin 5mg  nightly) - Continue delirium and fall precautions  BPH Urinating appropriately. - Continue Finasteride 5mg  daily.  HTN BP mostly within goal range. - Continue Losartan 50mg  daily  FEN/GI: Regular PPx: Lovenox-- labs ordered for tomorrow 12/27 Dispo:Medically stable for SNF awaiting court order for financial POA so finances can be managed   Subjective:  No complaints this morning, no pain  anywhere. He was sleeping comfortably when I came into the room.  Objective: Temp:  [97.6 F (36.4 C)-98 F (36.7 C)] 98 F (36.7 C) (12/25 1959) Pulse Rate:  [79-103] 103 (12/25 1959) Resp:  [16-20] 20 (12/25 1959) BP: (116-143)/(58-84) 138/84 (12/25 1959) SpO2:  [96 %-97 %] 96 % (12/25 1959) Physical Exam: General: Resting comfortably in bed, in no distress Cardiovascular: RRR Respiratory: Normal work of breathing on room air, symmetric chest rise and fall Abdomen: Soft, non-distended. Extremities: Warm, dry, well-perfused  Laboratory: Recent Labs  Lab 09/07/21 0140  WBC 7.9  HGB 10.4*  HCT 31.9*  PLT 225   Recent Labs  Lab 09/07/21 0140  NA 135  K 3.6  CL 102  CO2 25  BUN 16  CREATININE 0.87  CALCIUM 8.8*  GLUCOSE 269*    Imaging/Diagnostic Tests: No results found.   Charles Brill, DO 09/13/2021, 6:24 AM PGY-1, [place] Intern pager: [phone], text pages welcome

## 2021-09-14 LAB — CREATININE, SERUM
Creatinine, Ser: 0.92 mg/dL (ref 0.61–1.24)
GFR, Estimated: >60 mL/min (ref >60)

## 2021-09-14 LAB — CBC
HCT: 33.7 % — ABNORMAL LOW (ref 39.0–52.0)
Hemoglobin: 11.1 g/dL — ABNORMAL LOW (ref 13.0–17.0)
MCH: 31 pg (ref 26.0–34.0)
MCHC: 32.9 g/dL (ref 30.0–36.0)
MCV: 94.1 fL (ref 80.0–100.0)
Platelets: 239 10*3/uL (ref 150–400)
RBC: 3.58 MIL/uL — ABNORMAL LOW (ref 4.22–5.81)
RDW: 14.7 % (ref 11.5–15.5)
WBC: 8.3 10*3/uL (ref 4.0–10.5)
nRBC: 0 % (ref 0.0–0.2)

## 2021-09-14 LAB — FERRITIN: Ferritin: 94 ng/mL (ref 24–336)

## 2021-09-14 LAB — PLATELET COUNT: Platelets: 231 10*3/uL (ref 150–400)

## 2021-09-14 MED ORDER — LIDOCAINE 5 % EX PTCH
1.0000 | MEDICATED_PATCH | CUTANEOUS | Status: DC
  Administered 2021-09-14 – 2021-09-24 (×8): 1 via TRANSDERMAL
  Filled 2021-09-14 (×10): qty 1

## 2021-09-14 NOTE — Progress Notes (Signed)
Family Medicine Teaching Service Daily Progress Note Intern Pager: [phone]  Patient name: Charles Fox Medical record number: [id number] Date of birth: 1951/06/15 Age: 70 y.o. Gender: male  Primary Care Provider: [name], MD Consultants: Psychiatry, Urology  Code Status: FULL  [name]:  8/19: Admitted for altered mental status and hematuria 8/20: Normal brain MRI improved mentation 8/21: MoCA 12/30, determined to not have capacity 8/23: PSA elevated.  Urology recommending keeping catheter and rechecking PSA in 1 month. 10/3: Self reported fall 10/20: New guardian: Charles Fox 10/21: Guardianship hearing, granted guardianship over person (not estate) 10/25: Informed by SW that APS worker applying for medicaid  11/3: Flu vaxx with guardian consent  11/30: Court date for guardianship of estate 12/1: Charles Fox ([employer])-vaxx with guardian consent 12/15: Awaiting financial POA to obtain court orders, waiting for this per SW  12/20: CTA PE for concerns of chest pain; resulted negative   Assessment and Plan: Charles Fox is a 70 year old male with PMH of T2DM, HTN, HLD, dementia, housing insecurity who initially presented in August 2022 for hematuria and AMS. He remains inpatient hospitalized due to barriers to safe disposition. He is medically stable for discharge to SNF.   Difficult Placement   Housing Insecurity  Patient lacks capacity, is at cognitively at baseline, still lacks the ability to manage his own affairs including healthcare and financial decisions. Await court appointed financial POA to determine payment for SNF.  Pt continues to be medically stable for discharge to SNF.  - appreciate CSW / CM assistance with disposition   Hypertension Mildly hypertensive this morning. Consider increasing Losartan if BP elevated. Serum Cr stable today (0.92).  - Continue Losartan 50 mg daily  Normocytic Anemia  Hgb stable (11.1), ferritin (94) and  platelet (239) normal.   - Consider discontinuing Lovenox and returning to ambulation q shift  Dementia   MDD  - Continue Seroquel, Celexa, Melatonin  - Fall precautions - Delirium precautions    Benign Prostate Hypertrophy  Stable. - Continue Flomax and Finasteride   Back Pain -Lidocaine patch   FEN/GI: Regular diet, Senna, Miralax, Zofran  PPx: Lovenox   Disposition: pending financial POA court determination   Subjective:  No significant overnight events. Has some lower left sided back pain.   Objective: Temp:  [97.9 F (36.6 C)-98.1 F (36.7 C)] 98.1 F (36.7 C) (12/26 2044) Pulse Rate:  [71-81] 81 (12/26 2044) Resp:  [20] 20 (12/26 2044) BP: (131-156)/(58-74) 146/65 (12/27 0250) SpO2:  [98 %] 98 % (12/26 0758)  Physical Exam: General: well appearing male in no acute distress putting on his socks  Cardiovascular: regular rate and rhythm Respiratory: clear to ascultation bilaterally  Abdomen: soft, non-tender, non-distended  Back: hypertonicity of lumbar paraspinal muscles on the left, no TTP Extremities: no LE edema   Laboratory: Recent Labs  Lab 09/14/21 0153  WBC 8.3  HGB 11.1*  HCT 33.7*  PLT 239   231   Recent Labs  Lab 09/14/21 0153  CREATININE 0.92    Imaging/Diagnostic Tests: No results found.   Charles Hensen, DO 09/14/2021, 6:25 AM PGY-3, [place] Intern pager: [phone], text pages welcome

## 2021-09-14 NOTE — Progress Notes (Signed)
Family Medicine Teaching Service Daily Progress Note Intern Pager: [phone]  Patient name: Charles Fox Medical record number: [id number] Date of birth: [birth year] Age: 70 y.o. Gender: male  Primary Care Provider: [name], MD Consultants: Psychiatry, Urology  Code Status: FULL  [name]:  8/19: Admitted for altered mental status and hematuria 8/20: Normal brain MRI improved mentation 8/21: MoCA 12/30, determined to not have capacity 8/23: PSA elevated.  Urology recommending keeping catheter and rechecking PSA in 1 month. 10/3: Self reported fall 10/20: New guardian: [name] 10/21: Guardianship hearing, granted guardianship over person (not estate) 10/25: Informed by SW that APS worker applying for medicaid  11/3: Flu vaxx with guardian consent  11/30: Court date for guardianship of estate 12/1: [name]-vaxx with guardian consent 12/15: Awaiting financial POA to obtain court orders, waiting for this per SW  12/20: CTA PE for concerns of chest pain; resulted negative   Assessment and Plan: Charles Fox is a 69 year old male with PMH of T2DM, HTN, HLD, dementia, housing insecurity who initially presented in August 2022 for hematuria and AMS. He remains inpatient hospitalized due to barriers to safe disposition. He is medically stable for discharge to SNF.  Difficult Placement   Housing Insecurity  Await court appointed financial POA for assistant with payment for SNF.  - appreciate CSW / CM assistance with disposition   Hypertension  Stable  - Continue Losartan 50 mg daily   Normocytic anemia  Stable  - monitor with CBC while on Lovenox for VTE ppx   Dementia   MDD  - Continue Seroquel, Celexa, Melatonin  - Fall and delirium precautions   BPH Stable  - Continue Flomax and Finasteride   Back Pain  - Lidocaine patch   FEN/GI: Regular diet, Senna and Miralax, Zofran  PPx: Lovenox   Disposition: pending financial POA  court determination   Subjective:  Back pain improved.  No significant events overnight.   Objective: Temp:  [97.4 F (36.3 C)-98 F (36.7 C)] 97.4 F (36.3 C) (12/28 0803) Pulse Rate:  [64-94] 64 (12/28 0803) Resp:  [19-20] 19 (12/28 0803) BP: (126-178)/(57-73) 169/73 (12/28 0803) SpO2:  [95 %-100 %] 95 % (12/28 0803)   Physical Exam: General: well appearing male, in no acute distress  Cardiovascular: RRR  Respiratory: clear bilaterally  Abdomen: soft, non-tender  Extremities: no LE edema   Laboratory: Recent Labs  Lab 09/14/21 0153  WBC 8.3  HGB 11.1*  HCT 33.7*  PLT 239   231   Recent Labs  Lab 09/14/21 0153  CREATININE 0.92      Imaging/Diagnostic Tests: No results found.   [name], DO 09/14/2021, 11:27 PM PGY-3, [place] Intern pager: [phone], text pages welcome

## 2021-09-14 NOTE — Progress Notes (Signed)
FPTS Brief Note Reviewed patient's vitals, recent notes.  Vitals:   09/13/21 0758 09/13/21 2044  BP: 131/74 (!) 156/58  Pulse: 71 81  Resp: 20 20  Temp: 97.9 F (36.6 C) 98.1 F (36.7 C)  SpO2: 98%     Hypertensive earlier this evening at 156/88.  Recheck BP.   At this time, no change in plan from day progress note.  [name], DO Page [phone] with questions about this patient.

## 2021-09-15 DIAGNOSIS — E1149 Type 2 diabetes mellitus with other diabetic neurological complication: Secondary | ICD-10-CM | POA: Diagnosis not present

## 2021-09-15 DIAGNOSIS — R4182 Altered mental status, unspecified: Secondary | ICD-10-CM | POA: Diagnosis not present

## 2021-09-15 DIAGNOSIS — Z658 Other specified problems related to psychosocial circumstances: Secondary | ICD-10-CM | POA: Diagnosis not present

## 2021-09-15 NOTE — Progress Notes (Signed)
FPTS Brief Note Reviewed patient's vitals, recent notes.  Vitals:   09/14/21 1937 09/15/21 0116  BP: (!) 178/73 (!) 126/57  Pulse: 94   Resp: 20   Temp: 98 F (36.7 C)   SpO2: 100%    At this time, no change in plan from day progress note.  [name], DO Page [phone] with questions about this patient.

## 2021-09-15 NOTE — TOC Progression Note (Signed)
Transition of Care [facility]) - Progression Note    Patient Details  Name: Charles Fox MRN: [id number] Date of Birth: 02-12-1951  Transition of Care [facility]) CM/SW [place], RN Phone Number: 09/15/2021, 1:43 PM  Clinical Narrative:    CM spoke with Devoria Albe, DSS regarding patient's need for ALF placement.  Ouida Sills states that she will speak with [facility] today after 3 pm meeting to discuss possible placement/ bed offer at the facility.  CM and MSW will continue to follow the patient for needed ALF placement.   Expected Discharge Plan: Skilled Nursing Facility Barriers to Discharge: SNF Pending bed offer  Expected Discharge Plan and Services Expected Discharge Plan: [place] In-house Referral: Clinical Social Work, PCP / Psychologist, educational Discharge Planning Services: CM Consult Post Acute Care Choice: [place] Living arrangements for the past 2 months: Homeless (Son requests that patient discharge to his care after SNF placement.)                                       Social Determinants of Health (SDOH) Interventions    Readmission Risk Interventions No flowsheet data found.

## 2021-09-16 DIAGNOSIS — G309 Alzheimer's disease, unspecified: Secondary | ICD-10-CM | POA: Diagnosis not present

## 2021-09-16 DIAGNOSIS — F02C2 Dementia in other diseases classified elsewhere, severe, with psychotic disturbance: Secondary | ICD-10-CM | POA: Diagnosis not present

## 2021-09-16 MED ORDER — LOSARTAN POTASSIUM 50 MG PO TABS
75.0000 mg | ORAL_TABLET | Freq: Every day | ORAL | Status: DC
  Administered 2021-09-16 – 2021-09-23 (×8): 75 mg via ORAL
  Filled 2021-09-16 (×8): qty 2

## 2021-09-16 NOTE — Progress Notes (Signed)
FPTS Brief Note Reviewed patient's vitals, recent notes.  Vitals:   09/15/21 0803 09/15/21 2118  BP: (!) 169/73 (!) 145/80  Pulse: 64 85  Resp: 19   Temp: (!) 97.4 F (36.3 C) 97.9 F (36.6 C)  SpO2: 95% 95%   Intermittently hypertensive. Consider increasing Losartan if persists.   At this time, no change in plan from day progress note.  [name], DO Page [phone] with questions about this patient.

## 2021-09-16 NOTE — TOC Progression Note (Signed)
Transition of Care [facility]) - Progression Note    Patient Details  Name: Charles Fox MRN: [id number] Date of Birth: July 08, 1951  Transition of Care [facility]) CM/SW [place], RN Phone Number: 09/16/2021, 2:27 PM  Clinical Narrative:    CM spoke with Charles Fox, CM with [facility] to have her review the patient's clinicals/ FL2 for possible placement.  FL2 was sent via secure email to [email or web address].  Charles Fox, CM at the facility states that she will speak with Charles Fox, DSS guardian directly regarding funding for placement.  CM and MSW with DTP Team will continue to follow the patient for placement.   Expected Discharge Plan: Skilled Nursing Facility Barriers to Discharge: SNF Pending bed offer  Expected Discharge Plan and Services Expected Discharge Plan: [place] In-house Referral: Clinical Social Work, PCP / Psychologist, educational Discharge Planning Services: CM Consult Post Acute Care Choice: [place] Living arrangements for the past 2 months: Homeless (Son requests that patient discharge to his care after SNF placement.)                                       Social Determinants of Health (SDOH) Interventions    Readmission Risk Interventions No flowsheet data found.

## 2021-09-16 NOTE — Progress Notes (Signed)
Family Medicine Teaching Service Daily Progress Note Intern Pager: [phone]  Patient name: Charles Fox Medical record number: [id number] Date of birth: Oct 04, 1950 Age: 70 y.o. Gender: male  Primary Care Provider: [name], MD Consultants: Psychiatry, Urology Code Status: Full  [name]:  8/19: Admitted for altered mental status and hematuria 8/20: Normal brain MRI improved mentation 8/21: MoCA 12/30, determined to not have capacity 8/23: PSA elevated.  Urology recommending keeping catheter and rechecking PSA in 1 month. 10/3: Self reported fall 10/20: New guardian: [name] 10/21: Guardianship hearing, granted guardianship over person (not estate) 10/25: Informed by SW that APS worker applying for medicaid  11/3: Flu vaxx with guardian consent  11/30: Court date for guardianship of estate 12/1: [place] ([employer])-vaxx with guardian consent 12/15: Awaiting financial POA to obtain court orders, waiting for this per SW  12/20: CTA PE for concerns of chest pain; resulted negative   Assessment and Plan: Charles Fox is a 70 year old male with PMH of T2DM, HTN, HLD, dementia, housing insecurity who initially presented in August 2022 for hematuria and AMS. He remains inpatient hospitalized due to barriers to safe disposition. He is medically stable for discharge to SNF.   Difficult Placement   Housing Insecurity Still waiting for court-appointed financial POA to assist with payment. Pt remains medically stable for discharge to SNF.  - CSW/CM assisting with disposition  HTN  BP persistently elevated on Losartan 50mg  daily, 146/82 today, above goal - Will increase Losartan to 75mg  daily  Dementia   MDD - Continue Seroquel, Celexa, Melatonin - Fall Precautions - Delirium precautions  BPH Stable. - Continue flomax and finasteride  Back Pain - Continue lidocaine patch  FEN/GI: Regular diet, senna, Miralax, Zofran PPx: Lovenox Dispo:SNF   pending financial POA    Subjective:  No acute complaints this morning.  Objective: Temp:  [97.9 F (36.6 C)] 97.9 F (36.6 C) (12/28 2118) Pulse Rate:  [85] 85 (12/28 2118) BP: (145)/(80) 145/80 (12/28 2118) SpO2:  [95 %] 95 % (12/28 2118) Physical Exam: General: Well-appearing, resting in bed Cardiovascular: Regular rate, regular rhythm, no murmur Respiratory: Clear to auscultation anteriorly Abdomen: Soft Extremities: Without edema or deformity  Laboratory: Recent Labs  Lab 09/14/21 0153  WBC 8.3  HGB 11.1*  HCT 33.7*  PLT 239   231   Recent Labs  Lab 09/14/21 0153  CREATININE 0.92     Imaging/Diagnostic Tests: No new imaging, tests  Eppie Gibson, MD 09/16/2021, 9:01 AM PGY-1, [place] Intern pager: [phone], text pages welcome

## 2021-09-17 DIAGNOSIS — R4182 Altered mental status, unspecified: Secondary | ICD-10-CM | POA: Diagnosis not present

## 2021-09-17 NOTE — Progress Notes (Signed)
FPTS Brief Note Reviewed patient's vitals, recent notes.  Vitals:   09/16/21 1925 09/16/21 2105  BP: 140/86 (!) 151/62  Pulse: 80 83  Resp: 18 16  Temp: 98 F (36.7 C) 97.9 F (36.6 C)  SpO2: 98% 96%   At this time, no change in plan from day progress note.  [name], DO Page [phone] with questions about this patient.

## 2021-09-17 NOTE — Progress Notes (Signed)
FPTS Brief Note Reviewed patient's vitals, recent notes.  Vitals:   09/17/21 1935 09/17/21 2103  BP: (!) 133/55 120/63  Pulse: 78   Resp: 17   Temp: 97.8 F (36.6 C)   SpO2: 100%    At this time, no change in plan from day progress note.  [name], DO Page [phone] with questions about this patient.

## 2021-09-17 NOTE — TOC Progression Note (Signed)
Transition of Care [facility]) - Progression Note    Patient Details  Name: Charles Fox MRN: [id number] Date of Birth: Oct 26, 1950  Transition of Care [facility]) CM/SW [place], RN Phone Number: 09/17/2021, 12:07 PM  Clinical Narrative:    CM spoke with [name], SW with [facility] and she plans to assess the patient on Monday, September 20, 2020 to establish if patient is appropriate for admission at the facility.  DSS Guardian is aware and clinicals and FL2 was received by the facility yesterday for review.  CM and MSW with DTP Team will continue to follow the patient for placement.   Expected Discharge Plan: Skilled Nursing Facility Barriers to Discharge: SNF Pending bed offer  Expected Discharge Plan and Services Expected Discharge Plan: [place] In-house Referral: Clinical Social Work, PCP / Psychologist, educational Discharge Planning Services: CM Consult Post Acute Care Choice: [place] Living arrangements for the past 2 months: Homeless (Son requests that patient discharge to his care after SNF placement.)                                       Social Determinants of Health (SDOH) Interventions    Readmission Risk Interventions No flowsheet data found.

## 2021-09-17 NOTE — Progress Notes (Signed)
Family Medicine Teaching Service Daily Progress Note Intern Pager: [phone]  Patient name: Charles Fox Medical record number: [id number] Date of birth: 03/17/1951 Age: 70 y.o. Gender: male  Primary Care Provider: [name], MD Consultants: Psychiatry, Urology Code Status: FULL  [name]:  8/19: Admitted for altered mental status and hematuria 8/20: Normal brain MRI improved mentation 8/21: MoCA 12/30, determined to not have capacity 8/23: PSA elevated.  Urology recommending keeping catheter and rechecking PSA in 1 month. 10/3: Self reported fall 10/20: New guardian: [name] 10/21: Guardianship hearing, granted guardianship over person (not estate) 10/25: Informed by SW that APS worker applying for medicaid  11/3: Flu vaxx with guardian consent  11/30: Court date for guardianship of estate 12/1: [place] ([employer])-vaxx with guardian consent 12/15: Awaiting financial POA to obtain court orders, waiting for this per SW  12/20: CTA PE for concerns of chest pain; resulted negative   Assessment and Plan: Charles Fox is a 70 year old male with PMH of T2DM, HTN, HLD, dementia, housing insecurity who initially presented in August 2022 for hematuria and AMS. He remains inpatient hospitalized due to barriers to safe disposition. He is medically stable for discharge to SNF.  Difficult Placement   Housing Insecurity Awaiting court-appointed financial POA. He is medically stable for discharge.  -CSW assisting; wait for recommendations  HTN Most recent BP 151/62, prior 140/86.  - Losartan increased to 75 mg yesterday; continue - Monitor BP  Dementia, MDD - Fall and delirium precautions - Continue Seroquel and celexa  FEN/GI: Regular diet, miralax PPx: Lovenox (monitor plt and cr weekly) Dispo:SNF  pending financial POA . Barriers include the former.   Subjective:  No acute events overnight. No concerns at this time.   Objective: Temp:  [97.9 F  (36.6 C)-98 F (36.7 C)] 97.9 F (36.6 C) (12/29 2105) Pulse Rate:  [80-83] 83 (12/29 2105) Resp:  [16-20] 16 (12/29 2105) BP: (140-151)/(62-86) 151/62 (12/29 2105) SpO2:  [96 %-98 %] 96 % (12/29 2105) Physical Exam:  General: Appears well lying in bed resting, no acute distress. Age appropriate. Cardiac: RRR, normal heart sounds, no murmurs Respiratory: CTAB, normal effort Abdomen: soft, nontender, nondistended Extremities: No edema or cyanosis. Skin: Warm and dry, no rashes noted Neuro: alert Psych: normal affect  Laboratory:  Recent Labs  Lab 09/14/21 0153  WBC 8.3  HGB 11.1*  HCT 33.7*  PLT 239   231   Recent Labs  Lab 09/14/21 0153  CREATININE 0.92   Imaging/Diagnostic Tests: No new imaging.  Gerlene Fee, DO 09/17/2021, 5:14 AM PGY-3, [place] Intern pager: [phone], text pages welcome

## 2021-09-18 DIAGNOSIS — F02C2 Dementia in other diseases classified elsewhere, severe, with psychotic disturbance: Secondary | ICD-10-CM | POA: Diagnosis not present

## 2021-09-18 DIAGNOSIS — G309 Alzheimer's disease, unspecified: Secondary | ICD-10-CM | POA: Diagnosis not present

## 2021-09-18 DIAGNOSIS — F322 Major depressive disorder, single episode, severe without psychotic features: Secondary | ICD-10-CM | POA: Diagnosis not present

## 2021-09-18 NOTE — Progress Notes (Signed)
FPTS Brief Note Reviewed patient's vitals, recent notes.  Vitals:   09/18/21 1100 09/18/21 2029  BP: 137/70 (!) 143/74  Pulse: 72 100  Resp: 18 18  Temp: 97.8 F (36.6 C) 98.3 F (36.8 C)  SpO2: 99% 96%   At this time, no change in plan from day progress note.  [name], DO Page [phone] with questions about this patient.

## 2021-09-18 NOTE — Progress Notes (Signed)
Family Medicine Teaching Service Daily Progress Note Intern Pager: [phone]  Patient name: Charles Fox Medical record number: [id number] Date of birth: Nov 26, 1950 Age: 70 y.o. Gender: male  Primary Care Provider: [name], MD Consultants: Psychiatry, Urology Code Status:  FULL  [name]:  8/19: Admitted for altered mental status and hematuria 8/20: Normal brain MRI improved mentation 8/21: MoCA 12/30, determined to not have capacity 8/23: PSA elevated.  Urology recommending keeping catheter and rechecking PSA in 1 month. 10/3: Self reported fall 10/20: New guardian: [name] 10/21: Guardianship hearing, granted guardianship over person (not estate) 10/25: Informed by SW that APS worker applying for medicaid  11/3: Flu vaxx with guardian consent  11/30: Court date for guardianship of estate 12/1: [place] ([employer])-vaxx with guardian consent 12/15: Awaiting financial POA to obtain court orders, waiting for this per SW  12/20: CTA PE for concerns of chest pain; resulted negative   Assessment and Plan: Charles Fox is a 70 year old male with PMH of T2DM, HTN, HLD, dementia, housing insecurity who initially presented in August 2022 for hematuria and AMS. He remains inpatient hospitalized due to barriers to safe disposition. He is medically stable for discharge to SNF.  Difficult Placement   Housing Insecurity Awaiting court-appointed financial POA. Potential to discharge 09/20/21. He is medically stable for discharge.  -CSW assisting; wait for recommendations   HTN Most recent BP 120/63, prior 133/55. - Continue Losartan 75 mg daily  - Monitor BP   Dementia, MDD - Fall and delirium precautions - Continue Seroquel and celexa  FEN/GI: Regular diet, miralax PPx: Lovenox (monitor plt and cr weekly) Dispo:SNF  pending financial POA . Barriers include the former.   Subjective:  Doing well. No concerns. No acute events overnight.    Objective:  Temp:  [97.8 F (36.6 C)] 97.8 F (36.6 C) (12/30 1935) Pulse Rate:  [78] 78 (12/30 1935) Resp:  [17] 17 (12/30 1935) BP: (120-133)/(55-63) 120/63 (12/30 2103) SpO2:  [100 %] 100 % (12/30 1935)  Physical Exam: General: Appears well, no acute distress. Age appropriate. Cardiac: RRR, normal heart sounds, no murmurs Respiratory: CTAB, normal effort Abdomen: soft, nontender, nondistended Extremities: No edema or cyanosis. Skin: Warm and dry, no rashes noted Neuro: alert and oriented Psych: normal affect   Laboratory: Recent Labs  Lab 09/14/21 0153  WBC 8.3  HGB 11.1*  HCT 33.7*  PLT 239   231   Recent Labs  Lab 09/14/21 0153  CREATININE 0.92    Imaging/Diagnostic Tests: No new imaging  Gerlene Fee, DO 09/18/2021, 6:23 AM PGY-3, [place] Intern pager: [phone], text pages welcome

## 2021-09-18 NOTE — Progress Notes (Signed)
Family Medicine Teaching Service Daily Progress Note Intern Pager: [phone]  Patient name: Charles Fox Medical record number: [id number] Date of birth: Oct 26, 1950 Age: 70 y.o. Gender: male  Primary Care Provider: [name], MD Consultants: Psychiatry, Urology   Code Status: Full Code  [name]:  8/19: Admitted for altered mental status and hematuria 8/20: Normal brain MRI improved mentation 8/21: MoCA 12/30, determined to not have capacity 8/23: PSA elevated.  Urology recommending keeping catheter and rechecking PSA in 1 month. 10/3: Self reported fall 10/20: New guardian: [name] 10/21: Guardianship hearing, granted guardianship over person (not estate) 10/25: Informed by SW that APS worker applying for medicaid  11/3: Flu vaxx with guardian consent  11/30: Court date for guardianship of estate 12/1: [place] ([employer])-vaxx with guardian consent 12/15: Awaiting financial POA to obtain court orders, waiting for this per SW  12/20: CTA PE for concerns of chest pain; resulted negative   Assessment and Plan: Charles Fox is a 70 y.o. male with PMH of T2DM, HTN, HLD, dementia, housing insecurity who initially presented in August 2022 for hematuria and AMS. He remains inpatient hospitalized due to barriers to safe disposition. He is medically stable for discharge to SNF.  Difficult Placement   Housing Insecurity Awaiting court-appointed financial POA. Potential to discharge 09/20/21. He is medically stable for discharge.  -CSW assisting; wait for recommendations  HTN Borderline elevated 143/74, will continue to monitor - continue losartan 75 mg daily, consider increase if still elevated 1/5 - BMP ordered 1/3  Dementia MDD - fall and delirium precautions - continue quetiapine, citalopram  FEN/GI: regular diet PPx: LMWH  Disposition: SNF pending financial POA  Subjective:  NAOE.  Patient was asleep but easily arousable.  When asked if  anything bothers him, he states "diabetes".  RN reports that he did not sleep well overnight.  Objective: Temp:  [97.8 F (36.6 C)-98.3 F (36.8 C)] 98.3 F (36.8 C) (12/31 2029) Pulse Rate:  [72-100] 100 (12/31 2029) Resp:  [18] 18 (12/31 2029) BP: (137-143)/(70-74) 143/74 (12/31 2029) SpO2:  [96 %-99 %] 96 % (12/31 2029) Physical Exam: General: alert, NAD Cardiovascular: RRR, no murmurs Respiratory: CTAB, no respiratory distress Abdomen: soft, non-tender Extremities: WWP, no edema  Laboratory: Recent Labs  Lab 09/14/21 0153  WBC 8.3  HGB 11.1*  HCT 33.7*  PLT 239   231   Recent Labs  Lab 09/14/21 0153  CREATININE 0.92      Imaging/Diagnostic Tests: No new imaging  [name], MD 09/19/2021, 6:08 AM PGY-2, [place] Intern pager: [phone], text pages welcome

## 2021-09-20 NOTE — TOC Progression Note (Addendum)
Transition of Care [facility]) - Progression Note    Patient Details  Name: Charles Fox MRN: [id number] Date of Birth: [birth year]  Transition of Care [facility]) CM/SW [place], RN Phone Number: 09/20/2021, 10:53 AM  Clinical Narrative:    CM left a message with [name], CM with [facility] and plans are for MSW with the facility to meet with the patient at the bedside to determine if the facility is able to offer placement at the facility through coordination with DSS guardian. CM called and updated bedside nursing and nursing secretary that the patient would have a social worker visit from the facility today.  09/20/2021 1450 - CM has been unable to reach [name], CM with [facility] at this time.  I left a message to f/u regarding patient's bedside assessment for possible placement at the facility.  CM and MSW with DTP Team will continue to follow the patient for placement at ALF facility with memory care if possible.   Expected Discharge Plan: Skilled Nursing Facility Barriers to Discharge: SNF Pending bed offer  Expected Discharge Plan and Services Expected Discharge Plan: [place] In-house Referral: Clinical Social Work, PCP / Psychologist, educational Discharge Planning Services: CM Consult Post Acute Care Choice: [place] Living arrangements for the past 2 months: Homeless (Son requests that patient discharge to his care after SNF placement.)                                       Social Determinants of Health (SDOH) Interventions    Readmission Risk Interventions No flowsheet data found.

## 2021-09-20 NOTE — Progress Notes (Signed)
Family Medicine Teaching Service Daily Progress Note Intern Pager: [phone]  Patient name: Charles Fox Medical record number: [id number] Date of birth: 12/13/1950 Age: 71 y.o. Gender: male  Primary Care Provider: [name], MD Consultants: Psychiatry, urology Code Status: Full code  [name]:  8/19: Admitted for altered mental status and hematuria 8/20: Normal brain MRI improved mentation 8/21: MoCA 12/30, determined to not have capacity 8/23: PSA elevated.  Urology recommending keeping catheter and rechecking PSA in 1 month. 10/3: Self reported fall 10/20: New guardian: [name] 10/21: Guardianship hearing, granted guardianship over person (not estate) 10/25: Informed by SW that APS worker applying for medicaid  11/3: Flu vaxx with guardian consent  11/30: Court date for guardianship of estate 12/1: [place] ([employer])-vaxx with guardian consent 12/15: Awaiting financial POA to obtain court orders, waiting for this per SW  12/20: CTA PE for concerns of chest pain; resulted negative   Assessment and Plan: Charles Fox is a 71 y.o. male with PMH of T2DM, HTN, HLD, dementia, housing insecurity who initially presented in August 2022 for hematuria and AMS. He remains inpatient hospitalized due to barriers to safe disposition. He is medically stable for discharge to SNF.  Difficult placement   housing insecurity Reportedly he has a potential discharge today.  He is medically stable for discharge. -Social work assisting  Hypertension Blood pressures last 24 hours ranged 123/93-149/79 -Continue losartan 75 mg daily consider increasing if BP remains elevated on 1/5 -BMP on 1/3  Dementia/MDD Following delirium precautions Continue quetiapine and citalopram  FEN/GI: Regular diet PPx: Low molecular weight heparin Dispo: SNF  Subjective:  Patient denies complaints this morning.  Resting comfortably in bed upon my interview.  Objective: Temp:   [97.5 F (36.4 C)-98 F (36.7 C)] 98 F (36.7 C) (01/02 0342) Pulse Rate:  [69-70] 70 (01/02 0342) Resp:  [18] 18 (01/02 0342) BP: (123-149)/(72-93) 149/79 (01/02 0342) SpO2:  [98 %-100 %] 98 % (01/02 0342) Physical Exam: General: Elderly male in no acute distress Cardiovascular: Regular rate and rhythm with no murmurs Respiratory: Clear to auscultation with no respiratory distress on room air Abdomen: Soft/nontender Extremities: No lower extremity edema  Laboratory: Recent Labs  Lab 09/14/21 0153  WBC 8.3  HGB 11.1*  HCT 33.7*  PLT 239   231   Recent Labs  Lab 09/14/21 0153  CREATININE 0.92    Lurline Del, DO 09/20/2021, 5:28 AM PGY-3, [place] Intern pager: [phone], text pages welcome

## 2021-09-21 DIAGNOSIS — E1169 Type 2 diabetes mellitus with other specified complication: Secondary | ICD-10-CM

## 2021-09-21 DIAGNOSIS — E785 Hyperlipidemia, unspecified: Secondary | ICD-10-CM

## 2021-09-21 DIAGNOSIS — E1159 Type 2 diabetes mellitus with other circulatory complications: Secondary | ICD-10-CM | POA: Diagnosis not present

## 2021-09-21 DIAGNOSIS — F322 Major depressive disorder, single episode, severe without psychotic features: Secondary | ICD-10-CM | POA: Diagnosis not present

## 2021-09-21 LAB — BASIC METABOLIC PANEL
Anion gap: 7 (ref 5–15)
Anion gap: 8 (ref 5–15)
BUN: 18 mg/dL (ref 8–23)
BUN: 15 mg/dL (ref 8–23)
CO2: 26 mmol/L (ref 22–32)
CO2: 27 mmol/L (ref 22–32)
Calcium: 8.9 mg/dL (ref 8.9–10.3)
Calcium: 9.1 mg/dL (ref 8.9–10.3)
Chloride: 100 mmol/L (ref 98–111)
Chloride: 101 mmol/L (ref 98–111)
Creatinine, Ser: 0.87 mg/dL (ref 0.61–1.24)
Creatinine, Ser: 0.97 mg/dL (ref 0.61–1.24)
GFR, Estimated: >60 mL/min (ref >60)
GFR, Estimated: >60 mL/min (ref >60)
Glucose, Bld: 195 mg/dL — ABNORMAL HIGH (ref 70–99)
Glucose, Bld: 155 mg/dL — ABNORMAL HIGH (ref 70–99)
Potassium: 4.2 mmol/L (ref 3.5–5.1)
Potassium: 4.5 mmol/L (ref 3.5–5.1)
Sodium: 133 mmol/L — ABNORMAL LOW (ref 135–145)
Sodium: 136 mmol/L (ref 135–145)

## 2021-09-21 NOTE — Progress Notes (Signed)
Family Medicine Teaching Service Daily Progress Note Intern Pager: [phone]  Patient name: Charles Fox Medical record number: [id number] Date of birth: 03-11-1951 Age: 71 y.o. Gender: male  Primary Care Provider: [name], MD Consultants: Psychiatry, urology Code Status: Full  [name]:  8/19: Admitted for altered mental status and hematuria 8/20: Normal brain MRI improved mentation 8/21: MoCA 12/30, determined to not have capacity 8/23: PSA elevated.  Urology recommending keeping catheter and rechecking PSA in 1 month. 10/3: Self reported fall 10/20: New guardian: Charles Fox 10/21: Guardianship hearing, granted guardianship over person (not estate) 10/25: Informed by SW that APS worker applying for medicaid  11/3: Flu vaxx with guardian consent  11/30: Court date for guardianship of estate 12/1: [place] ([employer])-vaxx with guardian consent 12/15: Awaiting financial POA to obtain court orders, waiting for this per SW  12/20: CTA PE for concerns of chest pain; resulted negative   Assessment and Plan: Charles Fox is a 71 y.o. male with PMH of T2DM, HTN, HLD, dementia, housing insecurity who initially presented in August 2022 for hematuria and AMS. He remains inpatient hospitalized due to barriers to safe disposition. He is medically stable for discharge to SNF.  Housing insecurity   difficulty placement Still waiting options for safe placement.  He is medically stable for discharge. -Appreciate social work's assistance  Hypertension Blood pressures over the last 24 hours ranged from 134-164/76-80. -Continue losartan 75 mg daily -AM BMP with sodium of 133, corrects to 135, and elevated glucose of 195, otherwise wnl.  Dementia/MDD Continue on citalopram and quetiapine.  Delirium precautions.  FEN/GI: Regular diet PPx: Lovenox Dispo: SNF, barriers include placement  Subjective:  No complaints or concerns this  morning.  Objective: Temp:  [97.5 F (36.4 C)-98.2 F (36.8 C)] 98.2 F (36.8 C) (01/02 2348) Pulse Rate:  [69-75] 75 (01/02 2348) Resp:  [18] 18 (01/02 2348) BP: (134-164)/(76-80) 164/80 (01/02 2200) SpO2:  [98 %] 98 % (01/02 2348) Physical Exam: General: 71 year old male lying in bed, no acute distress Cardiovascular: RRR with no murmur Respiratory: Clear to auscultation bilaterally Abdomen: No abdominal discomfort to palpation Extremities: No lower extremity edema  Laboratory: No results for input(s): WBC, HGB, HCT, PLT in the last 168 hours. No results for input(s): NA, K, CL, CO2, BUN, CREATININE, CALCIUM, PROT, BILITOT, ALKPHOS, ALT, AST, GLUCOSE in the last 168 hours.  Invalid input(s): LABALBU   Charles Del, DO 09/21/2021, 2:11 AM PGY-3, [place] Intern pager: [phone], text pages welcome

## 2021-09-21 NOTE — Progress Notes (Signed)
CSW met with patient at bedside to inform him of upcoming appointment with [name] from [facility]. ° °Charles Fox, MSW, LCSW °Transitions of Care   Clinical Social Worker II °[phone] ° °

## 2021-09-21 NOTE — TOC Progression Note (Signed)
Transition of Care [facility]) - Progression Note    Patient Details  Name: Charles Fox MRN: [id number] Date of Birth: 1951/05/07  Transition of Care [facility]) CM/SW [place], RN Phone Number: 09/21/2021, 11:20 AM  Clinical Narrative:    CM spoke with [name], CM with [facility] and she is planning to visit with the patient at the bedside today at 1 pm for an assessment and possible bed offer at the facility.  Bedside nursing and secretary were updated on pending appointment with the facility social worker.  CM and MSW with DTP Team will continue to follow the patient for needed ALF placement.   Expected Discharge Plan: Skilled Nursing Facility Barriers to Discharge: SNF Pending bed offer  Expected Discharge Plan and Services Expected Discharge Plan: [place] In-house Referral: Clinical Social Work, PCP / Psychologist, educational Discharge Planning Services: CM Consult Post Acute Care Choice: [place] Living arrangements for the past 2 months: Homeless (Son requests that patient discharge to his care after SNF placement.)                                       Social Determinants of Health (SDOH) Interventions    Readmission Risk Interventions No flowsheet data found.

## 2021-09-22 ENCOUNTER — Ambulatory Visit: Payer: Medicare HMO | Admitting: Family Medicine

## 2021-09-22 MED ORDER — ASPIRIN EC 81 MG PO TBEC
81.0000 mg | DELAYED_RELEASE_TABLET | Freq: Every day | ORAL | Status: DC
  Administered 2021-09-22 – 2021-09-24 (×3): 81 mg via ORAL
  Filled 2021-09-22 (×3): qty 1

## 2021-09-22 NOTE — Plan of Care (Signed)

## 2021-09-22 NOTE — TOC Progression Note (Signed)
Transition of Care [facility]) - Progression Note    Patient Details  Name: Charles Fox MRN: [id number] Date of Birth: October 16, 1950  Transition of Care [facility]) CM/SW [place], RN Phone Number: 09/22/2021, 8:18 AM  Clinical Narrative:    CM called and left a message with [name], MSW at [facility] to follow up regarding bedside assessment for possible bed offer at the facility coordinated with DSS guardian, Devoria Albe.  CM and MSW with DTP continue to follow the patient for ALF placement.   Expected Discharge Plan: Skilled Nursing Facility Barriers to Discharge: SNF Pending bed offer  Expected Discharge Plan and Services Expected Discharge Plan: [place] In-house Referral: Clinical Social Work, PCP / Psychologist, educational Discharge Planning Services: CM Consult Post Acute Care Choice: [place] Living arrangements for the past 2 months: Homeless (Son requests that patient discharge to his care after SNF placement.)                                       Social Determinants of Health (SDOH) Interventions    Readmission Risk Interventions No flowsheet data found.

## 2021-09-22 NOTE — Progress Notes (Signed)
Family Medicine Teaching Service Daily Progress Note Intern Pager: [phone]  Patient name: Charles Fox Medical record number: [id number] Date of birth: 1950/11/19 Age: 71 y.o. Gender: male  Primary Care Provider: [name], MD Consultants: Psychiatry, urology Code Status: Full  [name]:  8/19: Admitted for altered mental status and hematuria 8/20: Normal brain MRI improved mentation 8/21: MoCA 12/30, determined to not have capacity 8/23: PSA elevated.  Urology recommending keeping catheter and rechecking PSA in 1 month. 10/3: Self reported fall 10/20: New guardian: [name] 10/21: Guardianship hearing, granted guardianship over person (not estate) 10/25: Informed by SW that APS worker applying for medicaid  11/3: Flu vaxx with guardian consent  11/30: Court date for guardianship of estate 12/1: [place] ([employer])-vaxx with guardian consent 12/15: Awaiting financial POA to obtain court orders, waiting for this per SW  12/20: CTA PE for concerns of chest pain; resulted negative   Assessment and Plan: Charles Fox is a 71 y.o. male with PMH of T2DM, HTN, HLD, dementia, housing insecurity who initially presented in August 2022 for hematuria and AMS. He remains inpatient hospitalized due to barriers to safe disposition. He is medically stable for discharge to SNF.  Difficult placement   housing insecurity He is medically stable for discharge.  Social work is helping [place] to try to find a safe disposition and working alongside his guardian.  Hypertension Blood pressure of 141/66-161/80.   -Continues on losartan 75 mg daily.  Dementia/MDD Continues on citalopram and quetiapine. -Continue delirium precautions and the above medications.  FEN/GI: Regular diet PPx: Lovenox Dispo: SNF, barriers include placement  Subjective:  No complaints this morning.  Objective: Temp:  [97.3 F (36.3 C)-98 F (36.7 C)] 98 F (36.7 C) (01/03 1913) Pulse  Rate:  [62-100] 100 (01/03 1913) Resp:  [18] 18 (01/03 1913) BP: (141-161)/(66-80) 161/80 (01/03 1913) SpO2:  [98 %] 98 % (01/03 1913) Physical Exam: General: Alert, no distress Cardiovascular: Regular rate and rhythm, no murmurs Respiratory: Clear to auscultation bilaterally Abdomen: No abdominal discomfort to palpation Extremities: No lower extremity edema  Laboratory: No results for input(s): WBC, HGB, HCT, PLT in the last 168 hours. Recent Labs  Lab 09/21/21 0112 09/21/21 1045  NA 133* 136  K 4.2 4.5  CL 100 101  CO2 26 27  BUN 18 15  CREATININE 0.87 0.97  CALCIUM 8.9 9.1  GLUCOSE 195* 155*     Lurline Del, DO 09/22/2021, 12:39 AM PGY-3, [place] Intern pager: [phone], text pages welcome

## 2021-09-22 NOTE — Progress Notes (Signed)
CSW spoke with Devoria Albe, patient's legal guardian who states she spoke with resident care coordinator at [facility] who plans to visit patient today at [place], MSW, LCSW Transitions of Care   Clinical Social Worker II [phone]

## 2021-09-23 ENCOUNTER — Other Ambulatory Visit (HOSPITAL_COMMUNITY): Payer: Self-pay

## 2021-09-23 MED ORDER — MELATONIN 5 MG PO TABS: 5.0000 mg | ORAL_TABLET | Freq: Every day | ORAL | 0 refills | Status: DC

## 2021-09-23 MED ORDER — TAMSULOSIN HCL 0.4 MG PO CAPS: 0.8000 mg | ORAL_CAPSULE | Freq: Every day | ORAL | 0 refills | Status: DC

## 2021-09-23 MED ORDER — SENNA 8.6 MG PO TABS: 1.0000 | ORAL_TABLET | Freq: Every day | ORAL | 0 refills | Status: DC

## 2021-09-23 MED ORDER — POLYETHYLENE GLYCOL 3350 17 G PO PACK: 17.0000 g | PACK | Freq: Every day | ORAL | 0 refills | Status: DC

## 2021-09-23 MED ORDER — FINASTERIDE 5 MG PO TABS: 5.0000 mg | ORAL_TABLET | Freq: Every day | ORAL | 0 refills | Status: DC

## 2021-09-23 MED ORDER — OLOPATADINE HCL 0.1 % OP SOLN: 1.0000 [drp] | Freq: Two times a day (BID) | OPHTHALMIC | 0 refills | Status: DC | PRN

## 2021-09-23 MED ORDER — CITALOPRAM HYDROBROMIDE 20 MG PO TABS
20.0000 mg | ORAL_TABLET | Freq: Every day | ORAL | 0 refills | Status: DC
  Filled 2021-09-23: qty 30, 30d supply, fill #0

## 2021-09-23 MED ORDER — LIDOCAINE 5 % EX PTCH: 1.0000 | MEDICATED_PATCH | CUTANEOUS | 0 refills | Status: DC

## 2021-09-23 MED ORDER — QUETIAPINE FUMARATE 25 MG PO TABS: 75.0000 mg | ORAL_TABLET | Freq: Every day | ORAL | 0 refills | Status: DC

## 2021-09-23 MED ORDER — LOSARTAN POTASSIUM 25 MG PO TABS: 75.0000 mg | ORAL_TABLET | Freq: Every day | ORAL | 0 refills | Status: DC

## 2021-09-23 NOTE — TOC Progression Note (Signed)
Transition of Care [facility]) - Progression Note    Patient Details  Name: Charles Fox MRN: [id number] Date of Birth: 09/19/1951  Transition of Care [facility]) CM/SW [place], RN Phone Number: 09/23/2021, 2:42 PM  Clinical Narrative:    CM spoke with [name], MSW and the patient will be discharged to the assisted living facility in the morning.  The medical physician team will be sending the patient's discharge medications to [facility] to be filled and delivered to the nursing station in the morning, 09/24/2021 prior to the patient discharging to the facility.  The patient received a rollator from [employer] on 06/03/2022 that will be sent with the patient to the facility.  CM and MSW with DTP Team will continue to follow the patient for discharge planning to the assisted living facility.   Expected Discharge Plan: Skilled Nursing Facility Barriers to Discharge: SNF Pending bed offer  Expected Discharge Plan and Services Expected Discharge Plan: [place] In-house Referral: Clinical Social Work, PCP / Psychologist, educational Discharge Planning Services: CM Consult Post Acute Care Choice: [place] Living arrangements for the past 2 months: Homeless (Son requests that patient discharge to his care after SNF placement.)                                       Social Determinants of Health (SDOH) Interventions    Readmission Risk Interventions No flowsheet data found.

## 2021-09-23 NOTE — Progress Notes (Signed)
Family Medicine Teaching Service Daily Progress Note Intern Pager: [phone]  Patient name: Charles Fox Medical record number: [id number] Date of birth: [birth year] Age: 71 y.o. Gender: male  Primary Care Provider: [name], MD Consultants: Psychiatry, urology Code Status: Full  [name]:  8/19 admitted for altered mental status and hematuria 8/20 normal brain MRI improved mentation 8/21 MoCA 12/30 determined not to have capacity 8/23 PSA elevated urology recommended keeping catheter and rechecking PSA in 1 month 10/3 self-reported fall 10/20 new guardian [name] 10/21 guardianship hearing, granted guardianship over person not estate 10/25 informed by social work that [name] applying for [name] 11/3 Fluvax with guardian consent 11/30 court date for guardianship of the state 12/1 [name] with guardian consent 12/15 awaiting financial power of attorney to obtain court orders, waiting for this for social work 12/20 CTA PE for concerns of chest pain which resulted negative  Assessment and Plan:  Charles Fox is a 71 year old male with past history of type 2 diabetes, HTN, HLD, dementia, housing insecurity who initially presented in August 2022 for hematuria and AMS.  He remains hospitalized due to barriers to safe disposition and he is medically stable for discharge to SNF  Difficult placement   housing insecurity Medically stable for discharge.  -Social work following, appreciate assistance in finding safe disposition working with guardian  Hypertension Blood pressures have been 638-177 systolics were 11-657. -losartan 75 mg daily  Dementia/MDD -Continue citalopram and quetiapine -Continue delirium precautions  FEN/GI: Regular diet PPx: Lovenox Dispo:SNF medically stable Patient can be [facility] to [name] tomorrow, 09/24/2020.  Subjective:  Mood appropriate today, patient had no complaints on exam  today  Objective: Temp:  [97.7 F (36.5 C)-97.9 F (36.6 C)] 97.9 F (36.6 C) (01/04 2036) Pulse Rate:  [77-82] 82 (01/04 2036) Resp:  [18-20] 20 (01/04 2036) BP: (138-155)/(60-101) 138/60 (01/04 2036) SpO2:  [95 %-99 %] 95 % (01/04 2036) Physical Exam: General: NAD, comfortable, mood appropriate Cardiovascular: RRR no m/r/g Respiratory: CTAB no w/r/c Abdomen: Nontender, nondistended Extremities: No swelling or calf tenderness  Laboratory: No results for input(s): WBC, HGB, HCT, PLT in the last 168 hours. Recent Labs  Lab 09/21/21 0112 09/21/21 1045  NA 133* 136  K 4.2 4.5  CL 100 101  CO2 26 27  BUN 18 15  CREATININE 0.87 0.97  CALCIUM 8.9 9.1  GLUCOSE 195* 155*    Imaging/Diagnostic Tests:   Gerrit Heck, MD 09/23/2021, 6:53 AM PGY-1, [place] Intern pager: [phone], text pages welcome

## 2021-09-23 NOTE — Plan of Care (Signed)

## 2021-09-23 NOTE — TOC Progression Note (Signed)
Transition of Care [facility]) - Progression Note    Patient Details  Name: JETTSON CRABLE MRN: [id number] Date of Birth: 03-Apr-1951  Transition of Care [facility]) CM/SW [place], RN Phone Number: 09/23/2021, 9:31 AM  Clinical Narrative:    CM called and left a message with [name], administrator with [facility] to check on bedside visit yesterday scheduled for 2 pm for assessment and possible placement.  CM and MSW with DTP Team will continue to follow the patient for needed placement.   Expected Discharge Plan: Skilled Nursing Facility Barriers to Discharge: SNF Pending bed offer  Expected Discharge Plan and Services Expected Discharge Plan: [place] In-house Referral: Clinical Social Work, PCP / Psychologist, educational Discharge Planning Services: CM Consult Post Acute Care Choice: [place] Living arrangements for the past 2 months: Homeless (Son requests that patient discharge to his care after SNF placement.)                                       Social Determinants of Health (SDOH) Interventions    Readmission Risk Interventions No flowsheet data found.

## 2021-09-23 NOTE — Progress Notes (Addendum)
CSW spoke with Lanae again who states she spoke with Ainsley Spinner at [facility] who is agreeable to accept patient. Patient can be discharged to [facility] tomorrow, 09/24/2020. [place] is located at [address] [place], [place] [address]. Devoria Albe will provide patient with transportation.  CSW faxed patient's clinicals for review.  CSW requested MD place COVID test order.  [name], MSW, LCSW Transitions of Care   Clinical Social Worker II [phone]

## 2021-09-24 ENCOUNTER — Other Ambulatory Visit (HOSPITAL_COMMUNITY): Payer: Self-pay

## 2021-09-24 LAB — SARS CORONAVIRUS 2 (TAT 6-24 HRS): SARS Coronavirus 2: NEGATIVE

## 2021-09-24 MED ORDER — FINASTERIDE 5 MG PO TABS
5.0000 mg | ORAL_TABLET | Freq: Every day | ORAL | 0 refills | Status: AC
  Filled 2021-09-24: qty 30, 30d supply, fill #0

## 2021-09-24 MED ORDER — QUETIAPINE FUMARATE 25 MG PO TABS
75.0000 mg | ORAL_TABLET | Freq: Every day | ORAL | 0 refills | Status: AC
  Filled 2021-09-24: qty 30, 10d supply, fill #0

## 2021-09-24 MED ORDER — TAMSULOSIN HCL 0.4 MG PO CAPS
0.8000 mg | ORAL_CAPSULE | Freq: Every day | ORAL | 0 refills | Status: AC
  Filled 2021-09-24: qty 60, 30d supply, fill #0

## 2021-09-24 MED ORDER — POLYETHYLENE GLYCOL 3350 17 GM/SCOOP PO POWD
17.0000 g | Freq: Every day | ORAL | 0 refills | Status: AC
  Filled 2021-09-24: qty 238, 14d supply, fill #0

## 2021-09-24 MED ORDER — CITALOPRAM HYDROBROMIDE 20 MG PO TABS
20.0000 mg | ORAL_TABLET | Freq: Every day | ORAL | 0 refills | Status: AC
  Filled 2021-09-24: qty 30, 30d supply, fill #0

## 2021-09-24 MED ORDER — LIDOCAINE 5 % EX PTCH
1.0000 | MEDICATED_PATCH | CUTANEOUS | 0 refills | Status: AC
  Filled 2021-09-24 (×2): qty 30, 30d supply, fill #0

## 2021-09-24 MED ORDER — OLOPATADINE HCL 0.1 % OP SOLN
1.0000 [drp] | Freq: Two times a day (BID) | OPHTHALMIC | 0 refills | Status: AC | PRN
  Filled 2021-09-24: qty 5, 50d supply, fill #0

## 2021-09-24 MED ORDER — MELATONIN 5 MG PO TABS
5.0000 mg | ORAL_TABLET | Freq: Every day | ORAL | 0 refills | Status: AC
  Filled 2021-09-24: qty 30, 30d supply, fill #0

## 2021-09-24 MED ORDER — SENNA 8.6 MG PO TABS
1.0000 | ORAL_TABLET | Freq: Every day | ORAL | 0 refills | Status: AC
  Filled 2021-09-24: qty 30, 30d supply, fill #0

## 2021-09-24 MED ORDER — LOSARTAN POTASSIUM 50 MG PO TABS
75.0000 mg | ORAL_TABLET | Freq: Every day | ORAL | 0 refills | Status: AC
Start: 2021-09-24 — End: ?
  Filled 2021-09-24: qty 45, 30d supply, fill #0

## 2021-09-24 MED ORDER — CARVEDILOL 3.125 MG PO TABS
3.1250 mg | ORAL_TABLET | Freq: Two times a day (BID) | ORAL | 0 refills | Status: AC
  Filled 2021-09-24: qty 60, 30d supply, fill #0

## 2021-09-24 MED ORDER — NITROGLYCERIN 0.4 MG SL SUBL
0.4000 mg | SUBLINGUAL_TABLET | SUBLINGUAL | 0 refills | Status: AC | PRN
  Filled 2021-09-24: qty 25, 30d supply, fill #0

## 2021-09-24 NOTE — Progress Notes (Addendum)
CSW spoke with patient at bedside to discuss his discharge plan. Patient in agreement and expressed happiness that he gets to leave the hospital. Patient asked several questions and all were answered. Patient aware his legal guardian Cristina Gong will transport him to his new residence in [place]. Lanae will pick up patient around 12 noon today.  CSW spoke with [place]ir[place], RN to discuss discharge plan - RN will assist patient in preparing for discharge.  CSW notified Mr. Stann Mainland at [facility] of patient's negative COVID result.  CSW spoke with pharmacy tech who states only one prescription was received for patient.   CSW spoke with Dr. Andria Frames to request he assist with prescription issue.  [name], MSW, LCSW Transitions of Care   Clinical Social Worker II [phone]

## 2021-09-24 NOTE — TOC Benefit Eligibility Note (Signed)
Patient Advocate Encounter   Received notification fthat prior authorization for Lidocaine 5% patches is required.   PA submitted on 09/24/2021 Key [id number] Status is pending       Lyndel Safe, [place] Patient Advocate Specialist [place] Patient Advocate Team Direct Number: [phone]  Fax: [phone]

## 2021-09-24 NOTE — TOC Benefit Eligibility Note (Signed)
Patient Advocate Encounter  Prior Authorization for Lidocaine 5% patches has been approved.    PA# [id number] Effective dates: 09/24/2021 through 09/18/2022      Lyndel Safe, [place] Patient Advocate Specialist [place] Patient Advocate Team Direct Number: [phone]  Fax: [phone]

## 2021-10-13 DIAGNOSIS — Z823 Family history of stroke: Secondary | ICD-10-CM | POA: Diagnosis not present

## 2021-10-13 DIAGNOSIS — E7800 Pure hypercholesterolemia, unspecified: Secondary | ICD-10-CM | POA: Diagnosis not present

## 2021-10-13 DIAGNOSIS — E559 Vitamin D deficiency, unspecified: Secondary | ICD-10-CM | POA: Diagnosis not present

## 2021-10-13 DIAGNOSIS — D519 Vitamin B12 deficiency anemia, unspecified: Secondary | ICD-10-CM | POA: Diagnosis not present

## 2021-10-13 DIAGNOSIS — F329 Major depressive disorder, single episode, unspecified: Secondary | ICD-10-CM | POA: Diagnosis not present

## 2021-10-13 DIAGNOSIS — E039 Hypothyroidism, unspecified: Secondary | ICD-10-CM | POA: Diagnosis not present

## 2021-10-13 DIAGNOSIS — N400 Enlarged prostate without lower urinary tract symptoms: Secondary | ICD-10-CM | POA: Diagnosis not present

## 2021-10-13 DIAGNOSIS — E119 Type 2 diabetes mellitus without complications: Secondary | ICD-10-CM | POA: Diagnosis not present

## 2021-10-21 DIAGNOSIS — E119 Type 2 diabetes mellitus without complications: Secondary | ICD-10-CM | POA: Diagnosis not present

## 2021-10-21 DIAGNOSIS — I10 Essential (primary) hypertension: Secondary | ICD-10-CM | POA: Diagnosis not present

## 2021-10-21 DIAGNOSIS — G4700 Insomnia, unspecified: Secondary | ICD-10-CM | POA: Diagnosis not present

## 2021-10-21 DIAGNOSIS — K5901 Slow transit constipation: Secondary | ICD-10-CM | POA: Diagnosis not present

## 2021-11-24 DIAGNOSIS — Z716 Tobacco abuse counseling: Secondary | ICD-10-CM | POA: Diagnosis not present

## 2021-11-24 DIAGNOSIS — Z0000 Encounter for general adult medical examination without abnormal findings: Secondary | ICD-10-CM | POA: Diagnosis not present

## 2021-11-24 DIAGNOSIS — Z122 Encounter for screening for malignant neoplasm of respiratory organs: Secondary | ICD-10-CM | POA: Diagnosis not present

## 2021-11-24 DIAGNOSIS — Z6835 Body mass index (BMI) 35.0-35.9, adult: Secondary | ICD-10-CM | POA: Diagnosis not present

## 2021-11-24 DIAGNOSIS — F17210 Nicotine dependence, cigarettes, uncomplicated: Secondary | ICD-10-CM | POA: Diagnosis not present

## 2021-11-24 DIAGNOSIS — Z1389 Encounter for screening for other disorder: Secondary | ICD-10-CM | POA: Diagnosis not present

## 2021-11-24 DIAGNOSIS — F329 Major depressive disorder, single episode, unspecified: Secondary | ICD-10-CM | POA: Diagnosis not present

## 2021-11-24 DIAGNOSIS — F0392 Unspecified dementia, unspecified severity, with psychotic disturbance: Secondary | ICD-10-CM | POA: Diagnosis not present

## 2021-12-02 DIAGNOSIS — R9431 Abnormal electrocardiogram [ECG] [EKG]: Secondary | ICD-10-CM | POA: Diagnosis not present

## 2021-12-02 DIAGNOSIS — I10 Essential (primary) hypertension: Secondary | ICD-10-CM | POA: Diagnosis not present

## 2022-01-12 DIAGNOSIS — F329 Major depressive disorder, single episode, unspecified: Secondary | ICD-10-CM | POA: Diagnosis not present

## 2022-01-12 DIAGNOSIS — F0392 Unspecified dementia, unspecified severity, with psychotic disturbance: Secondary | ICD-10-CM | POA: Diagnosis not present

## 2022-01-12 DIAGNOSIS — F17210 Nicotine dependence, cigarettes, uncomplicated: Secondary | ICD-10-CM | POA: Diagnosis not present

## 2022-01-12 DIAGNOSIS — E7800 Pure hypercholesterolemia, unspecified: Secondary | ICD-10-CM | POA: Diagnosis not present

## 2022-01-12 DIAGNOSIS — I739 Peripheral vascular disease, unspecified: Secondary | ICD-10-CM | POA: Diagnosis not present

## 2022-01-12 DIAGNOSIS — M79661 Pain in right lower leg: Secondary | ICD-10-CM | POA: Diagnosis not present

## 2022-01-12 DIAGNOSIS — E559 Vitamin D deficiency, unspecified: Secondary | ICD-10-CM | POA: Diagnosis not present

## 2022-01-12 DIAGNOSIS — E039 Hypothyroidism, unspecified: Secondary | ICD-10-CM | POA: Diagnosis not present

## 2022-01-12 DIAGNOSIS — G608 Other hereditary and idiopathic neuropathies: Secondary | ICD-10-CM | POA: Diagnosis not present

## 2022-01-12 DIAGNOSIS — E119 Type 2 diabetes mellitus without complications: Secondary | ICD-10-CM | POA: Diagnosis not present

## 2022-01-12 DIAGNOSIS — G603 Idiopathic progressive neuropathy: Secondary | ICD-10-CM | POA: Diagnosis not present

## 2022-02-11 DIAGNOSIS — Z122 Encounter for screening for malignant neoplasm of respiratory organs: Secondary | ICD-10-CM | POA: Diagnosis not present

## 2022-02-11 DIAGNOSIS — I2510 Atherosclerotic heart disease of native coronary artery without angina pectoris: Secondary | ICD-10-CM | POA: Diagnosis not present

## 2022-02-11 DIAGNOSIS — R918 Other nonspecific abnormal finding of lung field: Secondary | ICD-10-CM | POA: Diagnosis not present

## 2022-02-11 DIAGNOSIS — Z716 Tobacco abuse counseling: Secondary | ICD-10-CM | POA: Diagnosis not present

## 2022-02-11 DIAGNOSIS — F17210 Nicotine dependence, cigarettes, uncomplicated: Secondary | ICD-10-CM | POA: Diagnosis not present

## 2022-05-05 ENCOUNTER — Other Ambulatory Visit: Payer: Self-pay

## 2022-05-05 NOTE — Patient Outreach (Signed)
[facility]) Care Management  05/05/2022  FURIOUS CHIARELLI [birth year] [id number]   Telephone call to patient for nurse call.  No answer. Unable to leave a message.  Plan: RN CM will attempt again within 4 business days.  Jone Baseman, RN, MSN [facility] Care Management Care Management Coordinator Direct Line [phone] Toll Free: [phone]  Fax: [phone]

## 2022-05-10 ENCOUNTER — Other Ambulatory Visit: Payer: Self-pay

## 2022-05-10 NOTE — Patient Outreach (Signed)
[facility]) Care Management  05/10/2022  Charles Fox 23-Aug-1951 [id number]   Telephone call to patient for nurse call.  No answer.  HIPAA compliant voice message left.    Plan: RN CM will attempt again within 4 business days.  Jone Baseman, RN, MSN [facility] Care Management Care Management Coordinator Direct Line [phone] Toll Free: [phone]  Fax: [phone]

## 2022-05-13 ENCOUNTER — Other Ambulatory Visit: Payer: Self-pay

## 2022-05-13 NOTE — Patient Outreach (Signed)
[facility]) Care Management  05/13/2022  Charles Fox 1950-12-14 [id number]   Telephone call to patient for nurse call.  No answer.  HIPAA compliant voice message left.    Plan: RN CM will close case.  Jone Baseman, RN, MSN [name] Care Management Care Management Coordinator Direct Line [phone] Toll Free: [phone]  Fax: [phone]

## 2022-08-05 ENCOUNTER — Telehealth: Payer: Self-pay

## 2022-08-05 NOTE — Patient Outreach (Signed)
  Care Coordination   08/05/2022 Name: Charles Fox MRN: [id number] DOB: August 29, 1951   Care Coordination Outreach Attempts:  An unsuccessful telephone outreach was attempted today to offer the patient information about available care coordination services as a benefit of their health plan.   Follow Up Plan:  No further outreach attempts will be made at this time. We have been unable to contact the patient to offer or enroll patient in care coordination services  Encounter Outcome:  No Answer  Care Coordination Interventions Activated:  No   Care Coordination Interventions:  No, not indicated    Johnney Killian, RN, BSN, CCM Care Management Coordinator [facility] Phone: [phone]: [phone]

## 2022-08-08 ENCOUNTER — Inpatient Hospital Stay (HOSPITAL_COMMUNITY): Payer: Medicare Other

## 2022-08-08 ENCOUNTER — Emergency Department (HOSPITAL_COMMUNITY): Payer: Medicare Other

## 2022-08-08 ENCOUNTER — Inpatient Hospital Stay (HOSPITAL_COMMUNITY)
Admission: EM | Admit: 2022-08-08 | Discharge: 2022-08-29 | DRG: 870 | Disposition: A | Discharge status: Skilled Nursing Facility | Payer: Medicare Other | Source: Skilled Nursing Facility | Attending: Internal Medicine | Admitting: Internal Medicine

## 2022-08-08 ENCOUNTER — Other Ambulatory Visit: Payer: Self-pay

## 2022-08-08 ENCOUNTER — Encounter (HOSPITAL_COMMUNITY): Payer: Self-pay

## 2022-08-08 DIAGNOSIS — R159 Full incontinence of feces: Secondary | ICD-10-CM | POA: Diagnosis present

## 2022-08-08 DIAGNOSIS — R569 Unspecified convulsions: Secondary | ICD-10-CM | POA: Diagnosis present

## 2022-08-08 DIAGNOSIS — N179 Acute kidney failure, unspecified: Secondary | ICD-10-CM

## 2022-08-08 DIAGNOSIS — Z8673 Personal history of transient ischemic attack (TIA), and cerebral infarction without residual deficits: Secondary | ICD-10-CM

## 2022-08-08 DIAGNOSIS — S022XXA Fracture of nasal bones, initial encounter for closed fracture: Secondary | ICD-10-CM

## 2022-08-08 DIAGNOSIS — Y95 Nosocomial condition: Secondary | ICD-10-CM | POA: Diagnosis present

## 2022-08-08 DIAGNOSIS — N170 Acute kidney failure with tubular necrosis: Secondary | ICD-10-CM | POA: Diagnosis present

## 2022-08-08 DIAGNOSIS — R9401 Abnormal electroencephalogram [EEG]: Secondary | ICD-10-CM | POA: Diagnosis present

## 2022-08-08 DIAGNOSIS — G9341 Metabolic encephalopathy: Secondary | ICD-10-CM | POA: Diagnosis present

## 2022-08-08 DIAGNOSIS — I10 Essential (primary) hypertension: Secondary | ICD-10-CM | POA: Diagnosis present

## 2022-08-08 DIAGNOSIS — E872 Acidosis, unspecified: Secondary | ICD-10-CM | POA: Diagnosis present

## 2022-08-08 DIAGNOSIS — R4701 Aphasia: Secondary | ICD-10-CM | POA: Diagnosis present

## 2022-08-08 DIAGNOSIS — Y92129 Unspecified place in nursing home as the place of occurrence of the external cause: Secondary | ICD-10-CM | POA: Diagnosis not present

## 2022-08-08 DIAGNOSIS — K920 Hematemesis: Secondary | ICD-10-CM | POA: Diagnosis present

## 2022-08-08 DIAGNOSIS — A4151 Sepsis due to Escherichia coli [E. coli]: Principal | ICD-10-CM | POA: Insufficient documentation

## 2022-08-08 DIAGNOSIS — R1312 Dysphagia, oropharyngeal phase: Secondary | ICD-10-CM | POA: Diagnosis present

## 2022-08-08 DIAGNOSIS — Z7982 Long term (current) use of aspirin: Secondary | ICD-10-CM

## 2022-08-08 DIAGNOSIS — Z6835 Body mass index (BMI) 35.0-35.9, adult: Secondary | ICD-10-CM

## 2022-08-08 DIAGNOSIS — K7689 Other specified diseases of liver: Secondary | ICD-10-CM | POA: Diagnosis present

## 2022-08-08 DIAGNOSIS — N329 Bladder disorder, unspecified: Secondary | ICD-10-CM | POA: Diagnosis present

## 2022-08-08 DIAGNOSIS — B9620 Unspecified Escherichia coli [E. coli] as the cause of diseases classified elsewhere: Secondary | ICD-10-CM

## 2022-08-08 DIAGNOSIS — R7881 Bacteremia: Secondary | ICD-10-CM

## 2022-08-08 DIAGNOSIS — F0280 Dementia in other diseases classified elsewhere without behavioral disturbance: Secondary | ICD-10-CM | POA: Diagnosis present

## 2022-08-08 DIAGNOSIS — G309 Alzheimer's disease, unspecified: Secondary | ICD-10-CM | POA: Diagnosis present

## 2022-08-08 DIAGNOSIS — Z23 Encounter for immunization: Secondary | ICD-10-CM

## 2022-08-08 DIAGNOSIS — R6521 Severe sepsis with septic shock: Secondary | ICD-10-CM

## 2022-08-08 DIAGNOSIS — J9600 Acute respiratory failure, unspecified whether with hypoxia or hypercapnia: Secondary | ICD-10-CM

## 2022-08-08 DIAGNOSIS — R55 Syncope and collapse: Secondary | ICD-10-CM | POA: Diagnosis present

## 2022-08-08 DIAGNOSIS — F05 Delirium due to known physiological condition: Secondary | ICD-10-CM | POA: Diagnosis not present

## 2022-08-08 DIAGNOSIS — I7121 Aneurysm of the ascending aorta, without rupture: Secondary | ICD-10-CM | POA: Diagnosis present

## 2022-08-08 DIAGNOSIS — Z79899 Other long term (current) drug therapy: Secondary | ICD-10-CM

## 2022-08-08 DIAGNOSIS — W1830XA Fall on same level, unspecified, initial encounter: Secondary | ICD-10-CM | POA: Diagnosis present

## 2022-08-08 DIAGNOSIS — D6489 Other specified anemias: Secondary | ICD-10-CM | POA: Diagnosis present

## 2022-08-08 DIAGNOSIS — J9601 Acute respiratory failure with hypoxia: Secondary | ICD-10-CM | POA: Diagnosis present

## 2022-08-08 DIAGNOSIS — E785 Hyperlipidemia, unspecified: Secondary | ICD-10-CM | POA: Diagnosis present

## 2022-08-08 DIAGNOSIS — N390 Urinary tract infection, site not specified: Secondary | ICD-10-CM | POA: Diagnosis present

## 2022-08-08 DIAGNOSIS — E860 Dehydration: Secondary | ICD-10-CM | POA: Diagnosis present

## 2022-08-08 DIAGNOSIS — L89302 Pressure ulcer of unspecified buttock, stage 2: Secondary | ICD-10-CM | POA: Diagnosis not present

## 2022-08-08 DIAGNOSIS — R4182 Altered mental status, unspecified: Secondary | ICD-10-CM

## 2022-08-08 DIAGNOSIS — E118 Type 2 diabetes mellitus with unspecified complications: Secondary | ICD-10-CM

## 2022-08-08 DIAGNOSIS — J690 Pneumonitis due to inhalation of food and vomit: Secondary | ICD-10-CM | POA: Diagnosis present

## 2022-08-08 DIAGNOSIS — Z7984 Long term (current) use of oral hypoglycemic drugs: Secondary | ICD-10-CM

## 2022-08-08 DIAGNOSIS — E1165 Type 2 diabetes mellitus with hyperglycemia: Secondary | ICD-10-CM | POA: Diagnosis present

## 2022-08-08 DIAGNOSIS — G9340 Encephalopathy, unspecified: Secondary | ICD-10-CM | POA: Insufficient documentation

## 2022-08-08 DIAGNOSIS — E669 Obesity, unspecified: Secondary | ICD-10-CM | POA: Diagnosis present

## 2022-08-08 DIAGNOSIS — G450 Vertebro-basilar artery syndrome: Secondary | ICD-10-CM | POA: Diagnosis present

## 2022-08-08 DIAGNOSIS — R410 Disorientation, unspecified: Secondary | ICD-10-CM | POA: Diagnosis present

## 2022-08-08 DIAGNOSIS — D696 Thrombocytopenia, unspecified: Secondary | ICD-10-CM | POA: Diagnosis not present

## 2022-08-08 DIAGNOSIS — W19XXXA Unspecified fall, initial encounter: Secondary | ICD-10-CM

## 2022-08-08 DIAGNOSIS — Z794 Long term (current) use of insulin: Secondary | ICD-10-CM

## 2022-08-08 DIAGNOSIS — F17210 Nicotine dependence, cigarettes, uncomplicated: Secondary | ICD-10-CM | POA: Diagnosis present

## 2022-08-08 DIAGNOSIS — A419 Sepsis, unspecified organism: Secondary | ICD-10-CM

## 2022-08-08 LAB — DIFFERENTIAL
Abs Immature Granulocytes: 0.04 10*3/uL (ref 0.00–0.07)
Basophils Absolute: 0.1 10*3/uL (ref 0.0–0.1)
Basophils Relative: 1 %
Eosinophils Absolute: 0.4 10*3/uL (ref 0.0–0.5)
Eosinophils Relative: 4 %
Immature Granulocytes: 0 %
Lymphocytes Relative: 20 %
Lymphs Abs: 1.8 10*3/uL (ref 0.7–4.0)
Monocytes Absolute: 0.7 10*3/uL (ref 0.1–1.0)
Monocytes Relative: 8 %
Neutro Abs: 6.2 10*3/uL (ref 1.7–7.7)
Neutrophils Relative %: 67 %

## 2022-08-08 LAB — I-STAT VENOUS BLOOD GAS, ED
Acid-Base Excess: 1 mmol/L (ref 0.0–2.0)
Acid-base deficit: 1 mmol/L (ref 0.0–2.0)
Bicarbonate: 23.5 mmol/L (ref 20.0–28.0)
Bicarbonate: 22.8 mmol/L (ref 20.0–28.0)
Calcium, Ion: 1.06 mmol/L — ABNORMAL LOW (ref 1.15–1.40)
Calcium, Ion: 0.97 mmol/L — ABNORMAL LOW (ref 1.15–1.40)
HCT: 36 % — ABNORMAL LOW (ref 39.0–52.0)
HCT: 38 % — ABNORMAL LOW (ref 39.0–52.0)
Hemoglobin: 12.2 g/dL — ABNORMAL LOW (ref 13.0–17.0)
Hemoglobin: 12.9 g/dL — ABNORMAL LOW (ref 13.0–17.0)
O2 Saturation: 92 %
O2 Saturation: 93 %
Potassium: 4.2 mmol/L (ref 3.5–5.1)
Potassium: 4.1 mmol/L (ref 3.5–5.1)
Sodium: 135 mmol/L (ref 135–145)
Sodium: 134 mmol/L — ABNORMAL LOW (ref 135–145)
TCO2: 25 mmol/L (ref 22–32)
TCO2: 24 mmol/L (ref 22–32)
pCO2, Ven: 37.9 mmHg — ABNORMAL LOW (ref 44–60)
pCO2, Ven: 27.3 mmHg — ABNORMAL LOW (ref 44–60)
pH, Ven: 7.401 (ref 7.25–7.43)
pH, Ven: 7.529 — ABNORMAL HIGH (ref 7.25–7.43)
pO2, Ven: 63 mmHg — ABNORMAL HIGH (ref 32–45)
pO2, Ven: 57 mmHg — ABNORMAL HIGH (ref 32–45)

## 2022-08-08 LAB — COMPREHENSIVE METABOLIC PANEL
ALT: 15 U/L (ref 0–44)
AST: 25 U/L (ref 15–41)
Albumin: 3.5 g/dL (ref 3.5–5.0)
Alkaline Phosphatase: 67 U/L (ref 38–126)
Anion gap: 19 — ABNORMAL HIGH (ref 5–15)
BUN: 14 mg/dL (ref 8–23)
CO2: 19 mmol/L — ABNORMAL LOW (ref 22–32)
Calcium: 9 mg/dL (ref 8.9–10.3)
Chloride: 99 mmol/L (ref 98–111)
Creatinine, Ser: 0.88 mg/dL (ref 0.61–1.24)
GFR, Estimated: >60 mL/min (ref >60)
Glucose, Bld: 173 mg/dL — ABNORMAL HIGH (ref 70–99)
Potassium: 4.5 mmol/L (ref 3.5–5.1)
Sodium: 137 mmol/L (ref 135–145)
Total Bilirubin: 0.7 mg/dL (ref 0.3–1.2)
Total Protein: 6.6 g/dL (ref 6.5–8.1)

## 2022-08-08 LAB — CBC
HCT: 36.2 % — ABNORMAL LOW (ref 39.0–52.0)
Hemoglobin: 11.8 g/dL — ABNORMAL LOW (ref 13.0–17.0)
MCH: 30.2 pg (ref 26.0–34.0)
MCHC: 32.6 g/dL (ref 30.0–36.0)
MCV: 92.6 fL (ref 80.0–100.0)
Platelets: 213 10*3/uL (ref 150–400)
RBC: 3.91 MIL/uL — ABNORMAL LOW (ref 4.22–5.81)
RDW: 13.8 % (ref 11.5–15.5)
WBC: 9.2 10*3/uL (ref 4.0–10.5)
nRBC: 0 % (ref 0.0–0.2)

## 2022-08-08 LAB — HEMOGLOBIN A1C
Hgb A1c MFr Bld: 6.6 % — ABNORMAL HIGH (ref 4.8–5.6)
Mean Plasma Glucose: 142.72 mg/dL

## 2022-08-08 LAB — I-STAT CHEM 8, ED
BUN: 15 mg/dL (ref 8–23)
Calcium, Ion: 1.05 mmol/L — ABNORMAL LOW (ref 1.15–1.40)
Chloride: 101 mmol/L (ref 98–111)
Creatinine, Ser: 0.8 mg/dL (ref 0.61–1.24)
Glucose, Bld: 171 mg/dL — ABNORMAL HIGH (ref 70–99)
HCT: 36 % — ABNORMAL LOW (ref 39.0–52.0)
Hemoglobin: 12.2 g/dL — ABNORMAL LOW (ref 13.0–17.0)
Potassium: 4.1 mmol/L (ref 3.5–5.1)
Sodium: 136 mmol/L (ref 135–145)
TCO2: 21 mmol/L — ABNORMAL LOW (ref 22–32)

## 2022-08-08 LAB — LACTIC ACID, PLASMA: Lactic Acid, Venous: 2.1 mmol/L (ref 0.5–1.9)

## 2022-08-08 LAB — URINALYSIS, ROUTINE W REFLEX MICROSCOPIC
Bilirubin Urine: NEGATIVE
Glucose, UA: NEGATIVE mg/dL
Hgb urine dipstick: NEGATIVE
Ketones, ur: NEGATIVE mg/dL
Leukocytes,Ua: NEGATIVE
Nitrite: NEGATIVE
Protein, ur: NEGATIVE mg/dL
Specific Gravity, Urine: 1.02 (ref 1.005–1.030)
pH: 7 (ref 5.0–8.0)

## 2022-08-08 LAB — APTT: aPTT: 25 seconds (ref 24–36)

## 2022-08-08 LAB — RAPID URINE DRUG SCREEN, HOSP PERFORMED
Amphetamines: NOT DETECTED
Barbiturates: NOT DETECTED
Benzodiazepines: NOT DETECTED
Cocaine: NOT DETECTED
Opiates: NOT DETECTED
Tetrahydrocannabinol: NOT DETECTED

## 2022-08-08 LAB — CK: Total CK: 631 U/L — ABNORMAL HIGH (ref 49–397)

## 2022-08-08 LAB — BASIC METABOLIC PANEL
Anion gap: 14 (ref 5–15)
BUN: 11 mg/dL (ref 8–23)
CO2: 21 mmol/L — ABNORMAL LOW (ref 22–32)
Calcium: 8.9 mg/dL (ref 8.9–10.3)
Chloride: 101 mmol/L (ref 98–111)
Creatinine, Ser: 0.88 mg/dL (ref 0.61–1.24)
GFR, Estimated: >60 mL/min (ref >60)
Glucose, Bld: 217 mg/dL — ABNORMAL HIGH (ref 70–99)
Potassium: 4.2 mmol/L (ref 3.5–5.1)
Sodium: 136 mmol/L (ref 135–145)

## 2022-08-08 LAB — VITAMIN B12: Vitamin B-12: 461 pg/mL (ref 180–914)

## 2022-08-08 LAB — PROTIME-INR
INR: 1.1 (ref 0.8–1.2)
Prothrombin Time: 14.2 seconds (ref 11.4–15.2)

## 2022-08-08 LAB — CBG MONITORING, ED
Glucose-Capillary: 175 mg/dL — ABNORMAL HIGH (ref 70–99)
Glucose-Capillary: 225 mg/dL — ABNORMAL HIGH (ref 70–99)
Glucose-Capillary: 247 mg/dL — ABNORMAL HIGH (ref 70–99)

## 2022-08-08 LAB — SEDIMENTATION RATE: Sed Rate: 19 mm/hr — ABNORMAL HIGH (ref 0–16)

## 2022-08-08 LAB — TSH: TSH: 1.376 u[IU]/mL (ref 0.350–4.500)

## 2022-08-08 LAB — FOLATE: Folate: 15.9 ng/mL (ref >5.9)

## 2022-08-08 LAB — ETHANOL: Alcohol, Ethyl (B): <10 mg/dL (ref <10)

## 2022-08-08 LAB — HIV ANTIBODY (ROUTINE TESTING W REFLEX): HIV Screen 4th Generation wRfx: NONREACTIVE

## 2022-08-08 LAB — AMMONIA: Ammonia: 49 umol/L — ABNORMAL HIGH (ref 9–35)

## 2022-08-08 MED ORDER — ONDANSETRON HCL 4 MG/2ML IJ SOLN: 4.0000 mg | Freq: Four times a day (QID) | INTRAMUSCULAR | Status: DC | PRN

## 2022-08-08 MED ORDER — ACETAMINOPHEN 325 MG PO TABS: 650.0000 mg | ORAL_TABLET | Freq: Four times a day (QID) | ORAL | Status: DC | PRN

## 2022-08-08 MED ORDER — STROKE: EARLY STAGES OF RECOVERY BOOK
Freq: Once | Status: AC
  Filled 2022-08-08: qty 1

## 2022-08-08 MED ORDER — HALOPERIDOL LACTATE 5 MG/ML IJ SOLN
2.5000 mg | Freq: Once | INTRAMUSCULAR | Status: AC | PRN
  Administered 2022-08-08: 2.5 mg via INTRAVENOUS
  Filled 2022-08-08: qty 1

## 2022-08-08 MED ORDER — LORAZEPAM 2 MG/ML IJ SOLN
INTRAMUSCULAR | Status: AC
  Administered 2022-08-08: 2 mg
  Filled 2022-08-08: qty 1

## 2022-08-08 MED ORDER — LACTATED RINGERS IV BOLUS
1000.0000 mL | Freq: Once | INTRAVENOUS | Status: AC
  Administered 2022-08-08: 1000 mL via INTRAVENOUS

## 2022-08-08 MED ORDER — INSULIN ASPART 100 UNIT/ML IJ SOLN
0.0000 [IU] | Freq: Three times a day (TID) | INTRAMUSCULAR | Status: DC
  Administered 2022-08-09 – 2022-08-10 (×4): 3 [IU] via SUBCUTANEOUS
  Administered 2022-08-10 (×2): 2 [IU] via SUBCUTANEOUS

## 2022-08-08 MED ORDER — ASPIRIN 300 MG RE SUPP
300.0000 mg | Freq: Every day | RECTAL | Status: DC
  Administered 2022-08-08 – 2022-08-10 (×3): 300 mg via RECTAL
  Filled 2022-08-08 (×3): qty 1

## 2022-08-08 MED ORDER — ACETAMINOPHEN 650 MG RE SUPP: 650.0000 mg | Freq: Four times a day (QID) | RECTAL | Status: DC | PRN

## 2022-08-08 MED ORDER — TETANUS-DIPHTH-ACELL PERTUSSIS 5-2.5-18.5 LF-MCG/0.5 IM SUSY
0.5000 mL | PREFILLED_SYRINGE | Freq: Once | INTRAMUSCULAR | Status: AC
  Administered 2022-08-08: 0.5 mL via INTRAMUSCULAR
  Filled 2022-08-08: qty 0.5

## 2022-08-08 MED ORDER — LORAZEPAM 2 MG/ML IJ SOLN
2.0000 mg | Freq: Once | INTRAMUSCULAR | Status: AC | PRN
  Administered 2022-08-08: 2 mg via INTRAVENOUS
  Filled 2022-08-08: qty 1

## 2022-08-08 MED ORDER — LORAZEPAM 2 MG/ML IJ SOLN
1.0000 mg | Freq: Once | INTRAMUSCULAR | Status: DC | PRN
  Filled 2022-08-08: qty 1

## 2022-08-08 MED ORDER — SODIUM CHLORIDE 0.9% FLUSH
3.0000 mL | Freq: Once | INTRAVENOUS | Status: AC
  Administered 2022-08-08: 3 mL via INTRAVENOUS

## 2022-08-08 MED ORDER — LORAZEPAM 2 MG/ML IJ SOLN
INTRAMUSCULAR | Status: AC
  Administered 2022-08-08: 1 mg via INTRAVENOUS
  Filled 2022-08-08: qty 1

## 2022-08-08 MED ORDER — ENOXAPARIN SODIUM 40 MG/0.4ML IJ SOSY: 40.0000 mg | PREFILLED_SYRINGE | INTRAMUSCULAR | Status: DC

## 2022-08-08 MED ORDER — IOHEXOL 350 MG/ML SOLN
75.0000 mL | Freq: Once | INTRAVENOUS | Status: AC | PRN
  Administered 2022-08-08: 75 mL via INTRAVENOUS

## 2022-08-08 MED ORDER — NICOTINE 14 MG/24HR TD PT24
14.0000 mg | MEDICATED_PATCH | Freq: Every day | TRANSDERMAL | Status: DC
  Administered 2022-08-09 – 2022-08-29 (×20): 14 mg via TRANSDERMAL
  Filled 2022-08-08 (×21): qty 1

## 2022-08-08 MED ORDER — ONDANSETRON HCL 4 MG PO TABS: 4.0000 mg | ORAL_TABLET | Freq: Four times a day (QID) | ORAL | Status: DC | PRN

## 2022-08-08 NOTE — ED Notes (Signed)
EEG tech reports that they are on the way down to assess the patient

## 2022-08-08 NOTE — ED Notes (Addendum)
SOFT WRIST RESTRAINTS REMOVED AT THIS TIME FOR MRI SKIN INTACT NO WOUNDS NOTED

## 2022-08-08 NOTE — ED Notes (Signed)
This RN has communicated with patient placement regarding the patient's situation

## 2022-08-08 NOTE — ED Notes (Signed)
Kirby Funk nightshift [facility] notified of the patient's situation

## 2022-08-08 NOTE — ED Notes (Signed)
Provider notified of pain score via secure chat

## 2022-08-08 NOTE — ED Notes (Signed)
EEG tech at bedside at this time.

## 2022-08-08 NOTE — ED Notes (Signed)
Soft wrist restraints not in use at this time upon return to the room patient is laying still comfortably and at ease

## 2022-08-08 NOTE — ED Notes (Signed)
CLEMENT: MR [place]'S CAREGIVER AT [facility] IN [facility] CAN BE REACHED AT [phone] WITH ANY QUESTIONS OR CONCERNS ADRIENNE: THE PATIENT'S LEGAL [facility] THE DEPARTMENT OF SOCIAL SERVICES IN [facility] CAN BE REACHED AT [phone]

## 2022-08-08 NOTE — ED Notes (Signed)
Provider notified of patient's HTN and tachycardia

## 2022-08-08 NOTE — ED Notes (Signed)
EKG completed at this time by this RN however poor quality because patinet not able to sit still despite education provided

## 2022-08-08 NOTE — Progress Notes (Signed)
I have sent the patient back because he was rolling around in the bed and pulling on everything, we cannot safely scan this patient . Patient is not safe for MRI at this time.

## 2022-08-08 NOTE — ED Notes (Signed)
Patient placement called at this time

## 2022-08-08 NOTE — ED Notes (Signed)
No phone found in the patient's belongings in attempt to find contact information for the patient. Will call [employer] per  [employer] who reports understanding of the patients situation.

## 2022-08-08 NOTE — Progress Notes (Signed)
PT Cancellation Note  Patient Details Name: Charles Fox MRN: [id number] DOB: [birth year]   Cancelled Treatment:    Reason Eval/Treat Not Completed: Medical issues which prohibited therapy (Pt had Haldol for MRI and nurse asked PT to HOLD this am. Will reattempt as able.)   Bevelyn Buckles 08/08/2022, 12:55 PM Tavari Loadholt M,PT Acute Rehab Services [phone]

## 2022-08-08 NOTE — ED Notes (Signed)
This RN communicated with [employer] EMS communication at this time and spoke with [place] who reports that she will tell her supervisor Alycia Rossetti over EMS that we are in need of an EMS run sheet for the patient to attempt to find more information on the patient.

## 2022-08-08 NOTE — ED Notes (Signed)
Providers updated on the patient's most recent vitals by Wynona Meals RN/ [facility]

## 2022-08-08 NOTE — ED Notes (Signed)
Patient transported to MRI 

## 2022-08-08 NOTE — Consult Note (Signed)
Neurology Consultation  Reason for Consult: Code Stroke Referring Physician: Elpidio Anis  CC: altered mental status  History is obtained from:EMS- patient from [facility] [phone]  HPI: Charles Fox is a 71 y.o. male with a past medical history of dementia, T2DM, HTN, HLD, CVA, housing insecurity, tobacco use who presented from his care home after a fall. Per report, patient was last seen normal this morning around 0600 when he went out to smoke a cigarette, however it is noted that he has his shoes on the wrong feet. He was found face down on the ground, altered, and aphasic and EMS was called. EMS reports he was agitated with them and had an episode of bloody emesis en route to the hospital. BP systolic 150s and glucose 137. He does have dried and fresh blood on his face. He is not speaking or following commands.    LKW: 0600 IV thrombolysis given?: no, facial trauma, stroke not suspected Premorbid modified Rankin scale (mRS): 4 4-Needs assistance to walk and tend to bodily needs   NIHSS components Score: Comment  1a Level of Conscious 0[x]  1[]  2[]  3[]         1b LOC Questions 0[]  1[]  2[x]           1c LOC Commands 0[]  1[]  2[x]           2 Best Gaze 0[x]  1[]  2[]           3 Visual 0[x]  1[]  2[]  3[]         4 Facial Palsy 0[x]  1[]  2[]  3[]         5a Motor Arm - left 0[x]  1[]  2[]  3[]  4[]  UN[]     5b Motor Arm - Right 0[x]  1[]  2[]  3[]  4[]  UN[]     6a Motor Leg - Left 0[]  1[]  2[]  3[x]  4[]  UN[]     6b Motor Leg - Right 0[]  1[]  2[]  3[x]  4[]  UN[]     7 Limb Ataxia 0[x]  1[]  2[]  3[]  UN[]       8 Sensory 0[x]  1[]  2[]  UN[]         9 Best Language 0[]  1[]  2[]  3[x]         10 Dysarthria 0[]  1[]  2[x]  UN[]         11 Extinct. and Inattention 0[x]  1[]  2[]           TOTAL: 15        ROS: Unable to obtain due to altered mental status.   History reviewed. No pertinent past medical history.   History reviewed. No pertinent family history.   Social History:   has no history on file for  tobacco use, alcohol use, and drug use.  Medications  Current Facility-Administered Medications:     stroke: early stages of recovery book, , Does not apply, Once, Pamalee Leyden, [place], NP   haloperidol lactate (HALDOL) injection 2.5 mg, 2.5 mg, Intravenous, Once PRN, Mardene Sayer, MD   LORazepam (ATIVAN) injection 1 mg, 1 mg, Intravenous, Once PRN, Mardene Sayer, MD   Tdap (BOOSTRIX) injection 0.5 mL, 0.5 mL, Intramuscular, Once, Mardene Sayer, MD No current outpatient medications on file.   Exam: Current vital signs: BP 125/76   Pulse 100   Resp 16   Wt 103 kg   SpO2 94%  Vital signs in last 24 hours: Pulse Rate:  [100] 100 (11/20 0900) Resp:  [16] 16 (11/20 0900) BP: (125)/(76) 125/76 (11/20 0900) SpO2:  [94 %] 94 % (11/20 0900) Weight:  [103 kg] 103 kg (11/20 0800)  GENERAL:  Awake, alert in NAD HEENT: - Normocephalic, blood dried on face, dry mm, no LN++, no Thyromegally LUNGS - Clear to auscultation bilaterally with no wheezes CV - S1S2 RRR, no m/r/g, equal pulses bilaterally. ABDOMEN - Soft, nontender, nondistended with normoactive BS Ext: warm, well perfused, intact peripheral pulses, no edema  NEURO:  Mental Status: Does not answer questions, does not follow commands Language: he is grunting, but does not speak to examiner.   Cranial Nerves: R 85mm, L 36mm, crosses midline, no facial asymmetry, facial sensation intact, hearing intact, tongue/uvula/soft palate midline, normal sternocleidomastoid and trapezius muscle strength. No evidence of tongue atrophy Motor: Moves bilateral upper extremities against gravity Bilateral lower extremities fall to bed  Tone: is normal and bulk is normal Sensation- Intact to light touch bilaterally Coordination: does not participate Gait- deferred   Labs I have reviewed labs in epic and the results pertinent to this consultation are:  CBC    Component Value Date/Time   WBC 9.2 08/08/2022 0800   RBC 3.91 (L)  08/08/2022 0800   HGB 12.2 (L) 08/08/2022 0857   HCT 36.0 (L) 08/08/2022 0857   PLT 213 08/08/2022 0800   MCV 92.6 08/08/2022 0800   MCH 30.2 08/08/2022 0800   MCHC 32.6 08/08/2022 0800   RDW 13.8 08/08/2022 0800   LYMPHSABS 1.8 08/08/2022 0800   MONOABS 0.7 08/08/2022 0800   EOSABS 0.4 08/08/2022 0800   BASOSABS 0.1 08/08/2022 0800    CMP     Component Value Date/Time   NA 135 08/08/2022 0857   K 4.2 08/08/2022 0857   CL 101 08/08/2022 0808   CO2 19 (L) 08/08/2022 0800   GLUCOSE 171 (H) 08/08/2022 0808   BUN 15 08/08/2022 0808   CREATININE 0.80 08/08/2022 0808   CALCIUM 9.0 08/08/2022 0800   PROT 6.6 08/08/2022 0800   ALBUMIN 3.5 08/08/2022 0800   AST 25 08/08/2022 0800   ALT 15 08/08/2022 0800   ALKPHOS 67 08/08/2022 0800   BILITOT 0.7 08/08/2022 0800   GFRNONAA >60 08/08/2022 0800    Lipid Panel  No results found for: "CHOL", "TRIG", "HDL", "CHOLHDL", "VLDL", "LDLCALC", "LDLDIRECT"   Imaging I have reviewed the images obtained:  CT-head Age-indeterminate infarcts in the left caudate head and anterior limb of the internal capsule, possibly also the left corona radiata. Recommend MRI for further evaluation. Aspects is 8.  CTA Head and Neck Severe atherosclerotic vasculopathy involving the intra and extracranial vasculature.   Carotid arteries: 1.  Severe stenosis of the bilateral ICAs at the origin.   Vertebral arteries: 1. Irregular narrowing and focal severe stenosis of the proximal V4 segment of the left vertebral artery. 2. Multifocal regions of severe stenosis of the right vertebral artery, which terminates in a PICA, and is occluded, as above.   Anterior circulation: 1. Severe stenosis of the anterior cavernous portions of the bilateral ICAs. 2.  Severe stenosis of the left M2 inferior division. 3.  Moderate stenosis of the A2 segment of the right ACA. 4.  Focal filling defect of the A1/A2 junction of the left ACA. 5. There is otherwise symmetric  arborization of MCA branches in the bilateral MCA territories.   Posterior circulation  1. Severe nearly occlusive stenosis of the P2 segment of the left PCA. 2. Moderate to severe stenosis is also present in the P2 segment of the right PCA.   Other:  1.  Moderate to severe spinal canal narrowing at C4-C5 and C5-C6    CT Face Likely mildly  displaced bilateral nasal bone fractures.   MRI examination of the brain- pending  Assessment:  Syncopal episode resulting in fall, seizure resulting in fall, mechanical fall  71 y.o. male with a past medical history of dementia, T2DM, HTN, HLD, CVA, housing insecurity, tobacco use who presented from his care home after a fall. He did receive 1 mg of ativan in CT for agitation prior to his imaging. Given the fall he did suffer, there is concern for a mechanical fall resulting in a concussion or a possible seizure or syncopal episode resulting in a fall. CT face does show a displaced bilateral nasal bone.    Recommendations: - MRI of the brain without contrast - Antiplatelet agent ASA 300mg   - Frequent neuro checks - Risk factor modification - Telemetry monitoring - Dementia panel- RPR, B12, Folate, sed rate, TSH - EEG pending    -- Patient seen and examined by NP/APP with MD. MD to update note as needed.   , DNP, FNP-BC [facility] Pager: [phone]  His last known well was initially reported as 6 AM, but there is concern that he may have been confused even earlier given that his shoes were on the wrong feet.  It is unclear exactly when he was truly last known well.  I have seen the patient and reviewed the above note.  I was actually able to get him to follow commands to wiggle toes and attempt to stick out his tongue.  He has dementia at baseline, with a Moca of 12/30 documented previously.  He is at risk for seizures, but I think it is also possible that he simply fell and has a mild concussion.  I do think  an EEG would be prudent, but if this is negative would not start antiepileptics.  MRI would also be helpful.  1/31, MD [facility] [phone]  If 7pm- 7am, please page neurology on call as listed in AMION.

## 2022-08-08 NOTE — ED Triage Notes (Signed)
Per Doristine Mango, the patient's care giver at [facility]... the patient normally preforms all ADLs without assistance. Per Doristine Mango the patient was outside enjoying a cigarette and fell face forward onto the concrete from standing. Doristine Mango reports that the patient has [employer] as his legal guardian (see notes in chart). Patient alert to verbal stimuli and oriented to self upon this RN's arrival at 1500.

## 2022-08-08 NOTE — Progress Notes (Signed)
EEG complete - results pending 

## 2022-08-08 NOTE — ED Notes (Signed)
Providers report acknowledgement of the patient's status within his workup and acknowledge delay in care situation.
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
## 2022-08-08 NOTE — Progress Notes (Signed)
Attempted to call for patient due to patient being combative. Will attempt as schedule permits.

## 2022-08-08 NOTE — Progress Notes (Signed)
Left word w secretary to have RN call EEG @ lab number when patient is going to and back from MRI so that we can attempt EEG with given sedative for MRI.  Checked on patient as well and he is actively trying to get out of bed.

## 2022-08-08 NOTE — ED Notes (Signed)
Provider updated on the patient's contact information at this time via secure chat.

## 2022-08-08 NOTE — ED Notes (Signed)
Skin beneath soft restraints is intact and blanchable. No wounds noted at this time

## 2022-08-08 NOTE — ED Notes (Addendum)
Callie charge Rn notified of the update on the patients situation. Patient placement called back about the situation (MEWS score)

## 2022-08-08 NOTE — ED Notes (Addendum)
This RN spoke with MRI tech at this time to see if we could re-evaluate getting the patient over to MRI. MRI tech reports that they will give handoff to nightshift and they will call this RN back. Communication and education regarding medication and proposed plan of care shared with MRI tech.

## 2022-08-08 NOTE — ED Notes (Signed)
Per Kirby Funk RN/ [facility]: MRI states 2 pts in MRI at this time, will contact RN when transport will be sent over for pt so that meds may be given at that time. They state they were unaware that those meds were ordered.

## 2022-08-08 NOTE — H&P (Addendum)
Date: 08/08/2022               Patient Name:  Charles Fox MRN: [id number]  DOB: [birth year] Age / Sex: 71 y.o., male   PCP: Pcp, No         Medical Service: Internal Medicine Teaching Service         Attending Physician: Dr. Ginnie Smart, MD    First Contact: Dr. Reinaldo Raddle  Pager: [phone]  Second Contact: Dr. Gwenevere Abbot  Pager: [phone]       After Hours (After 5p/  First Contact Pager: [phone]  weekends / holidays): Second Contact Pager: [phone]   Chief Complaint: fall/confusion   History of Present Illness:   [name] is a 71 yo M with a PMH of dementia, T2DM, Htn, HLD, CVA, housing insecurity, and tobacco use disorder who presents from his assisted living facility after being found down.    Patient per chart and nursing staff as patient is drowsy.   Patient was last seen normal around 0600. He went out to smoke a cigarette, however it was noted that his shoes were on the wrong feet. Patient subsequently fell forward hitting his face on the ground. When he was found, he was face down on the ground, altered, and aphasic. EMS noted that the patient was agitated with an episode of bloody emesis en route to the hospital.    A code stroke was called on arrival due to patient's altered mentation. CT head without contrast showed no evidence of hemorrhagic stroke. It was notable for age indeterminate infarcts of L caudate head and anterior limb of the internal capsule and possibly also the L corona radiata. He was also noted to have mildly displaced bilateral nasal bone fractures. 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 He did have a mild AG acidosis, but otherwise no significant lab abnormalities including a normal BG and PCO2 (on vbg).   Of note, patient had a long hospitalization 04/2021-09/2021 for altered mental status does not appear a fall preceded this episode. At that time it  was thought that his symptoms were secondary to worsening dementia.    Meds: Patient not able to provide history, drowsy and confused.    Allergies: Allergies as of 08/08/2022   (Not on File)   History reviewed. No pertinent past medical history.  Family History: Patient not able to provide history, drowsy and confused.   Social History: Patient not able to provide history, drowsy and confused.   Review of Systems: A complete ROS was negative except as per HPI.   Physical Exam: Blood pressure 137/74, pulse 96, resp. rate 15, weight 103 kg, SpO2 98 %.  Constitutional: Well-developed, well-nourished, drowsy HENT:  Face: blood in Nares and in mouth. R jaw swollen  Cardiovascular: Normal rate, regular rhythm, intact distal pulses. No gallop and no friction rub.  No murmur heard. No lower extremity edema  Pulmonary: Non labored breathing on room air, no wheezing or rales  Abdominal: Soft. Normal bowel sounds. Non distended and non tender    Neurological: Drowsy, when awake oriented to person, place, and time.  Skin: Skin is warm and dry.    EKG: personally reviewed my interpretation is sinus tachycardia, no ST or T wave changes   CXR: personally reviewed my interpretation is vascular congestion c/f pulmonary edema  Assessment & Plan by Problem: Principal Problem:   Syncope Charles Fox is  a 71 yo M with a PMH of dementia, T2DM, Htn, HLD, CVA, housing insecurity, and tobacco use disorder who presents from his assisted living facility after a fall and for confusion.   #?Syncope  #AMS #Dementia  It is unclear if patient was confused prior to his fall or afterwards. It is also not clear if he had a syncopal episode. Because patient was noted to have blood in his mouth and nares as well as nasal fractures it is likely that he had a syncopal episode prior to his fall and his confusion is a result of his trauma.  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 It is possible that he had a posterior stroke leading to a syncopal episode. He has no history of arrhythmias and his EKG was without AV block or tachy/brady arrhythmias. He did have a TTE 08/2021 which had normal EF, mild LVH, and no mitral stenosis or aortic stenosis. Also considered seizure as well as possible etiology for his fall and current symptoms. His UDS was negative. There is no concern for infection. Finally, patient is on antihypertensives which could cause him to have a syncopal episode.   Neurology was consulted as a code stroke was called. CT head showed no hemorrhage, but patient did have age indeterminate infarcts in the L caudate head and anterior internal capsule. CTA head and neck showed significant disease through anterior and posterior circulation as well as vertebral arteries and carotid arteries.   -F/u MRI brain and neurology recommendations -F/u EEG, TTE, telemetry  #T2DM SSI while patient remains confused.   #HTN Hold home anti hypertensives in the setting of possible stroke.   #TUD Nicotine patch   #?Bladder mass Patient was hospitalized 04/2021 noted to have urinary retention and hematuria. A CT stone protocol was obtained which showed posterior thickening of the bladder wall.  -Patient will need repeat imaging, he was supposed to have urology follow up this can likely be done in the outpatient setting.   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 Will need repeat CTA chest.   Patient should remain NPO due to confusion.   Dispo: Admit patient to Inpatient with expected length of stay greater than 2 midnights.  Signed: Marolyn Haller, MD 08/08/2022, 11:48 AM  After 5pm on weekdays and 1pm on weekends: On Call pager: [phone]

## 2022-08-08 NOTE — Progress Notes (Signed)
Echo attempted at 12:50, patient rolling around, not suitable for imaging at this time. [facility] Darby Shadwick RDCS

## 2022-08-08 NOTE — Evaluation (Signed)
Physical Therapy Evaluation Patient Details Name: Charles Fox MRN: [id number] DOB: 1951/04/11 Today's Date: 08/08/2022  History of Present Illness  [name] is a 71 yo M admittted 11/20 after being found down outside of his group home.  PMH of dementia, T2DM, Htn, HLD, CVA, housing insecurity, and tobacco use disorder  Clinical Impression  Pt admitted with above diagnosis. Pt was unable to do more than sit EOB due to lethargy vs restlessness and agitation when wrist restraints removed. Will have to assess OOB mobility further in subsequent sessions.  Pt currently with functional limitations due to the deficits listed below (see PT Problem List). Pt will benefit from skilled PT to increase their independence and safety with mobility to allow discharge to the venue listed below.          Recommendations for follow up therapy are one component of a multi-disciplinary discharge planning process, led by the attending physician.  Recommendations may be updated based on patient status, additional functional criteria and insurance authorization.  Follow Up Recommendations Skilled nursing-short term rehab (<3 hours/day) (TBA once meds clear) Can patient physically be transported by private vehicle: Yes    Assistance Recommended at Discharge Frequent or constant Supervision/Assistance  Patient can return home with the following  A little help with walking and/or transfers;A little help with bathing/dressing/bathroom;Assistance with cooking/housework;Assist for transportation;Help with stairs or ramp for entrance    Equipment Recommendations Other (comment) (TBA)  Recommendations for Other Services       Functional Status Assessment Patient has had a recent decline in their functional status and demonstrates the ability to make significant improvements in function in a reasonable and predictable amount of time.     Precautions / Restrictions Precautions Precautions:  Fall Restrictions Weight Bearing Restrictions: No      Mobility  Bed Mobility Overal bed mobility: Independent             General bed mobility comments: Pt continuously attempting to climb out of bed and had to place the wrist restraints back on pt.    Transfers                   General transfer comment: Not safe to assess today although nurse states pt did get OOB earlier prior to wrist restraints in place.    Ambulation/Gait                  Stairs            Wheelchair Mobility    Modified Rankin (Stroke Patients Only)       Balance Overall balance assessment: Needs assistance Sitting-balance support: No upper extremity supported, Feet supported Sitting balance-Leahy Scale: Poor Sitting balance - Comments: Pt impulsive and needs min guard assist for safety.                                     Pertinent Vitals/Pain Pain Assessment Pain Assessment: No/denies pain    Home Living Family/patient expects to be discharged to:: Private residence Living Arrangements: Group Home Available Help at Discharge: Available PRN/intermittently Type of Home: Group Home           Home Equipment: Rolling Walker (2 wheels);BSC/3in1 Additional Comments: Chart states that pt is from a group home.  Pt states he has a RW and 3N1    Prior Function Prior Level of Function : Independent/Modified Independent  Hand Dominance        Extremity/Trunk Assessment   Upper Extremity Assessment Upper Extremity Assessment: Overall WFL for tasks assessed    Lower Extremity Assessment Lower Extremity Assessment: Overall WFL for tasks assessed    Cervical / Trunk Assessment Cervical / Trunk Assessment: Normal  Communication   Communication: No difficulties  Cognition Arousal/Alertness: Lethargic, Suspect due to medications Behavior During Therapy: Agitated, Restless, Impulsive Overall Cognitive Status: History  of cognitive impairments - at baseline                                 General Comments: dementia, not following commands consistently, oriented to self only        General Comments General comments (skin integrity, edema, etc.): VSS    Exercises     Assessment/Plan    PT Assessment Patient needs continued PT services  PT Problem List Decreased activity tolerance;Decreased balance;Decreased mobility;Decreased knowledge of use of DME;Decreased coordination;Decreased cognition;Decreased safety awareness;Decreased knowledge of precautions       PT Treatment Interventions DME instruction;Gait training;Functional mobility training;Therapeutic activities;Therapeutic exercise;Balance training;Patient/family education    PT Goals (Current goals can be found in the Care Plan section)  Acute Rehab PT Goals Patient Stated Goal: unable to state PT Goal Formulation: With patient Time For Goal Achievement: 08/22/22 Potential to Achieve Goals: Fair    Frequency Min 3X/week     Co-evaluation               AM-PAC PT "6 Clicks" Mobility  Outcome Measure Help needed turning from your back to your side while in a flat bed without using bedrails?: None Help needed moving from lying on your back to sitting on the side of a flat bed without using bedrails?: None Help needed moving to and from a bed to a chair (including a wheelchair)?: A Little Help needed standing up from a chair using your arms (e.g., wheelchair or bedside chair)?: A Little Help needed to walk in hospital room?: A Lot Help needed climbing 3-5 steps with a railing? : A Lot 6 Click Score: 18    End of Session   Activity Tolerance: Patient limited by lethargy;Treatment limited secondary to agitation Patient left: with call bell/phone within reach;with nursing/sitter in room;with restraints reapplied (on stretcher) Nurse Communication: Mobility status PT Visit Diagnosis: Unsteadiness on feet  (R26.81);Muscle weakness (generalized) (M62.81)    Time: 9147-8295 PT Time Calculation (min) (ACUTE ONLY): 10 min   Charges:   PT Evaluation $PT Eval Moderate Complexity: 1 Mod          Dashauna Heymann M,PT Acute Rehab Services [phone]   Bevelyn Buckles 08/08/2022, 4:10 PM

## 2022-08-08 NOTE — ED Notes (Addendum)
Dr hatcher notified of the patinet's persistant tachycardia, hypertension, and appearance (uncomfortable unable to sit still as if in pain.) Patient repositioned and cleaned up at this time. This RN to call MRI to attempt to set up plan for the patient to go over when possible as Patient is having a hard time following directions or sitting still

## 2022-08-08 NOTE — ED Notes (Signed)
PHLEBOTOMY AT [facility]

## 2022-08-08 NOTE — ED Notes (Signed)
Charge nurse [employer] RN notified of the patient situation

## 2022-08-08 NOTE — ED Notes (Addendum)
This RN attempted to call Son and all emergency numbers available to this RN without successfully reaching anyone. This RN has spoken to lydia RN/AC regarding the patient's situation

## 2022-08-08 NOTE — ED Notes (Signed)
Help get patient cleaned up changed sheets placed a clean brief patient is resting

## 2022-08-08 NOTE — ED Notes (Signed)
This RN spoke with [employer] who report that they have no documentation of picking up a patient with this patient's exact demographics.

## 2022-08-08 NOTE — ED Notes (Signed)
EEG tech notified that the patient is laying quiet and ready for his EEG

## 2022-08-08 NOTE — ED Notes (Signed)
Per savannah RN she has contacted phlebotomy to come and attempt to draw labs at this time. This RN to put in consult for IV team as well due to previous RN reporting having little success with IV stick.

## 2022-08-08 NOTE — Progress Notes (Signed)
Not available for EEG at the moment per nurse.  Will check back after pt has MRI.

## 2022-08-08 NOTE — ED Notes (Addendum)
This RN spoke with MRI and educated the tech on the plan of care which was shared with this RN by previous Charity fundraiser. MRI tech report that they are a bit backed up and will see the patient as soon as possible. MRI tech's concerns regarding the ability for the patient to sit still on the table sent to the provider Dr Ninetta Lights via secure chat . Communication and education regarding medication plan shared with MRI tech when educating plan of care.

## 2022-08-08 NOTE — Progress Notes (Signed)
Went to attempt pt again. Per RN there is a bed ready 3w04 and he is about to move. No medication has been given at this time and after he gets moved, see if he can be given medications at that time.

## 2022-08-08 NOTE — Code Documentation (Signed)
Charles Fox is a 72 yr old male arriving to [facility] via EMS on 08/08/2022. He has a PMH of dementia, DM, and HTN. No known blood thinner use. Pt is from a group home setting where he was last known well this morning at 0600 when he went outside for a smoke. He was found this morning face down, bloody, incontinent and non-verbal.     Stroke team at bridge on patient's arrival. Airway cleared by EDP (who accompanied [place] to CT) and labs/CBG obtained. Pt to CT with team. NIHSS 15. (Please see documentation for stroke times and NIHSS details). Pt is non-verbal and unable to follow commands. He is agitated, and moves all extremities. 2 mg ativan given to help pt remain still for scans.    CTNC obtained, which was negative for acute hemorrhage per Dr. Amada Jupiter. CTA obtained which was negative for LVO per Dr Amada Jupiter. Pt is not a candidate for thrombolysis due to acute facial trauma and low suspicion for stroke. He is not a candidate for mechanical thrombectomy as LVO negative. Pt returned to room 12 where his workup will continue. Pt able to state name and follow commands once in room.

## 2022-08-08 NOTE — Hospital Course (Addendum)
Mr. GLORIA LAMBERTSON is a 71 yo M with a PMH of dementia, T2DM, Htn, HLD, CVA, housing insecurity, and tobacco use disorder who presents from his assisted living facility after being found down.    Patient per chart and nursing staff as patient is drowsy.   Patient was last seen normal around 0600. He went out to smoke a cigarette, however it was noted that his shoes were on the wrong feet. Patient subsequently fell forward hitting his face on the ground. When he was found, he was face down on the ground, altered, and aphasic. EMS noted that the patient was agitated with an episode of bloody emesis en route to the hospital.    A code stroke was called on arrival due to patient's altered mentation. CT head without contrast showed no evidence of hemorrhagic stroke. It was notable for age indeterminate infarcts of L caudate head and anterior limb of the internal capsule and possibly also the L corona radiata. He was also noted to have mildly displaced bilateral nasal bone fractures. 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 He did have a mild AG acidosis, but otherwise no significant lab abnormalities including a normal BG and PCO2 (on vbg).    ALF,   Confusion, concern for stroke. No deficits.   H/o delirium and seizure, dementia.   Labs have been unrevealing.   Neuro would like patient to have complete stroke work up inpateint.   F/u infectious work up.   08/09/22 Comfortably laying in bed with no acute distress. Appears somnolent. Pt appropriately answered all questions. Gauze dressing present on chin.    Jesusita Oka [phone]

## 2022-08-09 ENCOUNTER — Inpatient Hospital Stay (HOSPITAL_COMMUNITY): Payer: Medicare Other

## 2022-08-09 DIAGNOSIS — R55 Syncope and collapse: Secondary | ICD-10-CM

## 2022-08-09 LAB — CBC
HCT: 35.6 % — ABNORMAL LOW (ref 39.0–52.0)
Hemoglobin: 12.4 g/dL — ABNORMAL LOW (ref 13.0–17.0)
MCH: 30.8 pg (ref 26.0–34.0)
MCHC: 34.8 g/dL (ref 30.0–36.0)
MCV: 88.3 fL (ref 80.0–100.0)
Platelets: 295 10*3/uL (ref 150–400)
RBC: 4.03 MIL/uL — ABNORMAL LOW (ref 4.22–5.81)
RDW: 13.9 % (ref 11.5–15.5)
WBC: 15.8 10*3/uL — ABNORMAL HIGH (ref 4.0–10.5)
nRBC: 0 % (ref 0.0–0.2)

## 2022-08-09 LAB — LIPID PANEL
Cholesterol: 122 mg/dL (ref 0–200)
HDL: 50 mg/dL (ref >40)
LDL Cholesterol: 64 mg/dL (ref 0–99)
Total CHOL/HDL Ratio: 2.4 RATIO
Triglycerides: 42 mg/dL (ref <150)
VLDL: 8 mg/dL (ref 0–40)

## 2022-08-09 LAB — ECHOCARDIOGRAM COMPLETE
AR max vel: 2.18 cm2
AV Area VTI: 2.12 cm2
AV Area mean vel: 2.14 cm2
AV Mean grad: 3 mmHg
AV Peak grad: 4.8 mmHg
Ao pk vel: 1.09 m/s
Area-P 1/2: 6.07 cm2
Calc EF: 56.1 %
Height: 72 in
MV M vel: 3.66 m/s
MV Peak grad: 53.6 mmHg
P 1/2 time: 407 msec
S' Lateral: 3.4 cm
Single Plane A2C EF: 54.6 %
Single Plane A4C EF: 56.4 %
Weight: 3626.13 oz

## 2022-08-09 LAB — RPR: RPR Ser Ql: NONREACTIVE

## 2022-08-09 LAB — GLUCOSE, CAPILLARY
Glucose-Capillary: 244 mg/dL — ABNORMAL HIGH (ref 70–99)
Glucose-Capillary: 230 mg/dL — ABNORMAL HIGH (ref 70–99)
Glucose-Capillary: 202 mg/dL — ABNORMAL HIGH (ref 70–99)
Glucose-Capillary: 226 mg/dL — ABNORMAL HIGH (ref 70–99)

## 2022-08-09 LAB — LACTIC ACID, PLASMA: Lactic Acid, Venous: 1.4 mmol/L (ref 0.5–1.9)

## 2022-08-09 MED ORDER — LABETALOL HCL 5 MG/ML IV SOLN
5.0000 mg | INTRAVENOUS | Status: DC | PRN
  Administered 2022-08-09: 5 mg via INTRAVENOUS

## 2022-08-09 MED ORDER — LABETALOL HCL 5 MG/ML IV SOLN
5.0000 mg | INTRAVENOUS | Status: DC | PRN
  Administered 2022-08-09 (×2): 5 mg via INTRAVENOUS
  Filled 2022-08-09 (×3): qty 4

## 2022-08-09 MED ORDER — LABETALOL HCL 5 MG/ML IV SOLN: 10.0000 mg | INTRAVENOUS | Status: DC | PRN

## 2022-08-09 MED ORDER — LABETALOL HCL 5 MG/ML IV SOLN
10.0000 mg | INTRAVENOUS | Status: DC
  Administered 2022-08-10 (×2): 10 mg via INTRAVENOUS
  Filled 2022-08-09 (×6): qty 4

## 2022-08-09 MED ORDER — PERFLUTREN LIPID MICROSPHERE
1.0000 mL | INTRAVENOUS | Status: AC | PRN
  Administered 2022-08-09: 2 mL via INTRAVENOUS

## 2022-08-09 MED ORDER — ACETAMINOPHEN 650 MG RE SUPP: 650.0000 mg | Freq: Four times a day (QID) | RECTAL | Status: DC

## 2022-08-09 MED ORDER — ACETAMINOPHEN 325 MG PO TABS
650.0000 mg | ORAL_TABLET | Freq: Four times a day (QID) | ORAL | Status: DC
  Administered 2022-08-09: 650 mg via ORAL
  Filled 2022-08-09 (×3): qty 2

## 2022-08-09 MED ORDER — ENOXAPARIN SODIUM 60 MG/0.6ML IJ SOSY
50.0000 mg | PREFILLED_SYRINGE | INTRAMUSCULAR | Status: DC
  Administered 2022-08-09 – 2022-08-10 (×2): 50 mg via SUBCUTANEOUS
  Filled 2022-08-09 (×3): qty 0.6

## 2022-08-09 MED ORDER — LABETALOL HCL 5 MG/ML IV SOLN
20.0000 mg | Freq: Once | INTRAVENOUS | Status: AC
  Administered 2022-08-09: 20 mg via INTRAVENOUS
  Filled 2022-08-09: qty 4

## 2022-08-09 MED ORDER — LABETALOL HCL 5 MG/ML IV SOLN: 5.0000 mg | Freq: Once | INTRAVENOUS | Status: DC

## 2022-08-09 MED ORDER — NITROGLYCERIN IN D5W 200-5 MCG/ML-% IV SOLN: 0.0000 ug/min | INTRAVENOUS | Status: DC

## 2022-08-09 MED ORDER — KETOROLAC TROMETHAMINE 15 MG/ML IJ SOLN
15.0000 mg | Freq: Four times a day (QID) | INTRAMUSCULAR | Status: DC | PRN
  Administered 2022-08-09: 15 mg via INTRAVENOUS
  Filled 2022-08-09: qty 1

## 2022-08-09 MED ORDER — LACTATED RINGERS IV SOLN: INTRAVENOUS | Status: AC

## 2022-08-09 NOTE — Progress Notes (Signed)
Mobility Specialist: Progress Note   08/09/22 1250  Mobility  Activity Stood at bedside  Level of Assistance +2 (takes two people)  Assistive Device Other (Comment) (HHA)  Distance Ambulated (ft) 4 ft  Activity Response Tolerated fair  $Mobility charge 1 Mobility   Pt received getting OOB. Unable to orient pt. NT and RN present to help assist pt back to bed. +2 modA to stand and MaxA to facilitate weight shift to side step towards HOB. No c/o throughout. Pt assisted back to bed with NT present in the room.   Adiva Boettner Mobility Specialist Please contact via SecureChat or Rehab office at [phone]

## 2022-08-09 NOTE — ED Notes (Signed)
Charge nurse [employer] notified of the reason for the delay of care. Per [facility] RN she is in contact with the [facility] regarding the patients care.

## 2022-08-09 NOTE — Progress Notes (Signed)
Subjective:  Patient more comfortable today. Appears somnolent but responsive and appropriately answering questions.   [facility] - spoke with Maralyn Sago (caregiver): is forgetful but otherwise independent. Fall was witnessed by caregiver and EMS called right away. Caregiver is Ms. Byrd Hesselbach, not currently at facility. They are holding his spot at the facility. [facility] administrator: [phone], confirmed that bed will not be given away.   DSS legal guardian [place]: Cell [phone]) - please call for any updates or consents.    Objective:  Vital signs in last 24 hours: Vitals:   08/09/22 0643 08/09/22 0803 08/09/22 1136 08/09/22 1426  BP:  (!) 179/92  (!) 194/94  Pulse:  100 (!) 102 89  Resp:  20  16  Temp:  99.4 F (37.4 C) 98.9 F (37.2 C)   TempSrc:  Axillary Axillary   SpO2:  99% 99% 93%  Weight:      Height: 6' (1.829 m)       Physical Exam:  Constitutional: lying in bed in no acute distress, opens eyes to verbal stimulus HEENT: dried blood in mouth, mild abrasion on chin clean and dry, covered by bandage CV: Regular rate and rhythm, no murmurs or gallops Pulm: Clear to auscultation bilaterally, no increased work of breathing Abdominal: Soft, non-distended, non-tender Neuro: alert and oriented to name, follows some commands  Labs     Latest Ref Rng & Units 08/09/2022    4:08 AM 08/08/2022    4:21 PM 08/08/2022    8:57 AM  CBC  WBC 4.0 - 10.5 K/uL 15.8     Hemoglobin 13.0 - 17.0 g/dL 54.0  08.6  76.1   Hematocrit 39.0 - 52.0 % 35.6  38.0  36.0   Platelets 150 - 400 K/uL 295          Latest Ref Rng & Units 08/08/2022    4:21 PM 08/08/2022    3:55 PM 08/08/2022    8:57 AM  CMP  Glucose 70 - 99 mg/dL  950    BUN 8 - 23 mg/dL  11    Creatinine 9.32 - 1.24 mg/dL  6.71    Sodium 245 - 809 mmol/L 134  136  135   Potassium 3.5 - 5.1 mmol/L 4.1  4.2  4.2   Chloride 98 - 111 mmol/L  101    CO2 22 - 32 mmol/L  21     Calcium 8.9 - 10.3 mg/dL  8.9     Summary Charles Fox is a 71 yo M with a PMH of dementia, T2DM, Htn, HLD, CVA, housing insecurity, and tobacco use disorder admitted for altered mental status after a fall.   Assessment/Plan:  Principal Problem:   Syncope Active Problems:   Altered mental status   Closed fracture of nasal bones   Controlled diabetes mellitus type 2 with complications (HCC)   Fall  Altered mental status Abnormal EEG Elevated ammonia Per conversation with caregivers at family group home and DSS legal guardian, patient is typically independent aside from mild confusion from dementia. Though his agitation is improved from yesterday, his current presentation appears to be a significant departure from baseline in terms of behavior and cognition. It is unclear whether his altered mental status preceded or began after his fall. If a syncopal episode, could be due to seizure, vertebrobasilar insufficiency, posterior stroke not seen on MRI.  EEG without seizures, but with triphasic morphology at 1 Hz which can been seen on the "ictal-interictal continuum"  but is more consistent with toxic metabolic encephalopathy. EEG findings may also be attributed to haldol and ativan received by patient yesterday. Possible etiology could be hypertensive encephalopathy, as patient has been hypertensive to 200s/100s, however when patient arrived he was normotensive and still altered at this time. With leukocytosis to 15.8 and initial presentation with elevated lactic acid, some concern for infectious process, though patient has remained afebrile, will recheck WBC tomorrow. If patient's mental status does not clear with aggressive BP management, can consider blood and urine cultures to further evaluate infectious etiology.  -am CBC - BP control as below  Hypertension Patient has been periodically hypertensive with systolic 167-230 and diastolic 92-129 over past 24 hours.  Prior to MRI, allowed  persistent hypertension. With negative MRI, no indication for permissive HTN. Patient evaluated by SLP, was recommended to be NPO, so will continue to hold home meds. Will manage pressure with IV labetalol. Pain may be contributing to his HTN, will also give IV toradol once BP target of 150s-80s.  - 10mg  labetalol q4, will hold for for systolic <140 and/or diastolic <90 and HR <60.  - acetaminophen q6h  - IV toradol for pain prn  Nasal fracture:  Patient found to have mild nasal fracture. Per ENT, given patient has no septal hematoma, appropriate management is outpatient follow-up in 7-10 days.  - acetaminophen q6h  - IV toradol for pain prn  Disposition Guardian and living facility contacted today, both confirmed that he is far from his baseline in terms of mental status. Please contact legal guardian from DSS 9-10 with updates and for consent at [phone]. Living facility confirmed he will have bed at discharge.   T2DM Fasting CBG 175-244 since admission, hbA1c 6.6%, patient receiving SSI 3U - SSI while patient remains NPO    DIET: NPO IVF: LR @ 52mL/hr DVT PPX: lovenox BOWEL: none CODE: full FAM COM: spoke with legal guardian from DSS 72m. Son listed in chart is deceased   PT/OT recs: pending Dispo: SNF or care home pending clinical improvement Barriers to discharge: clinical improvement and stability    LOS: 1 day   Redmond Pulling, Medical Student 08/09/2022, 4:06 PM

## 2022-08-09 NOTE — Progress Notes (Signed)
  Echocardiogram 2D Echocardiogram has been performed.  Charles Fox 08/09/2022, 9:35 AM

## 2022-08-09 NOTE — Progress Notes (Signed)
PT Cancellation Note  Patient Details Name: Charles Fox MRN: [id number] DOB: [birth year]   Cancelled Treatment:    Reason Eval/Treat Not Completed: Fatigue/lethargy limiting ability to participate, per RN pt lethargic and not following commands, will hold until pt level of arousal improves, will check back as schedule allows to continue with PT POC.  Lenora Boys. PTA Acute Rehabilitation Services Office: [phone]     Catalina Antigua 08/09/2022, 1:00 PM

## 2022-08-09 NOTE — ED Notes (Addendum)
Updated by MD Marijo Conception of goal of SBP<180

## 2022-08-09 NOTE — Progress Notes (Addendum)
Occupational Therapy Evaluation Patient Details Name: Charles Fox MRN: [id number] DOB: 11-12-1950 Today's Date: 08/09/2022   History of Present Illness Charles Fox is a 71 yo M admittted 11/20 after being found down outside of his group home.  PMH of dementia, T2DM, Htn, HLD, CVA, housing insecurity, and tobacco use disorder   Clinical Impression   Called [facility] and spoke to staff who states that Charles Fox normally walks without any AD inside of the home, uses a RW outdside only and requires set up/S with ADL tasks for cognitive cuing. Charles Fox has lived in the Care Home since 09/2021. Entered room with bed alarm sounding as pt rolling toward bed rail, trying to remove B mittens. Lethargy limiting ability to participate in mobility at this time. Lethargy most likely due to medication. Once level of arousal improves, hopefully Charles Fox will be able to progress to DC back to his familiar home with [facility] services. Will follow.      Recommendations for follow up therapy are one component of a multi-disciplinary discharge planning process, led by the attending physician.  Recommendations may be updated based on patient status, additional functional criteria and insurance authorization.   Follow Up Recommendations  Skilled nursing-short term rehab (<3 hours/day) (hopefully Return to Care HOme)     Assistance Recommended at Discharge Frequent or constant Supervision/Assistance  Patient can return home with the following A little help with walking and/or transfers;A little help with bathing/dressing/bathroom;Direct supervision/assist for medications management;Direct supervision/assist for financial management;Assistance with cooking/housework;Assist for transportation;Help with stairs or ramp for entrance    Functional Status Assessment  Patient has had a recent decline in their functional status and demonstrates the ability to make significant improvements in function in a reasonable and  predictable amount of time.  Equipment Recommendations  None recommended by OT    Recommendations for Other Services       Precautions / Restrictions Precautions Precautions: Fall Precaution Comments: watch BP      Mobility Bed Mobility Overal bed mobility: Needs Assistance             General bed mobility comments: trying to move otward rail; assited with repositioning; noted pt urinating - ? increased restlessness    Transfers                   General transfer comment: not assessed due to restlessness      Balance                                           ADL either performed or assessed with clinical judgement   ADL                                         General ADL Comments: currently total A; Said yes to sipping water however would not suck through straw     Vision   Additional Comments: eyes closed throughout session     Perception Perception Comments: will assess   Praxis      Pertinent Vitals/Pain Pain Assessment Pain Assessment: Faces Faces Pain Scale: No hurt     Hand Dominance     Extremity/Trunk Assessment Upper Extremity Assessment Upper Extremity Assessment: Difficult to assess due to impaired cognition (appears to be moving both spontaneously wihtout  weakness noted)   Lower Extremity Assessment Lower Extremity Assessment: Defer to PT evaluation       Communication Communication Communication: Other (comment) (affected by level of arousal)   Cognition Arousal/Alertness: Lethargic, Suspect due to medications Behavior During Therapy: Restless, Flat affect, Impulsive Overall Cognitive Status: Impaired/Different from baseline Area of Impairment:  (inconsistently following commands; hx of dementia; will further assess when level of arousal improves)                               General Comments: knows staff; intermittent confusion; able to carry on conversations with  staff     General Comments  BP 189/100    Exercises     Shoulder Instructions      Home Living Family/patient expects to be discharged to:: Group home Living Arrangements: Group Home Available Help at Discharge: Available 24 hours/day                                    Prior Functioning/Environment Prior Level of Function : Independent/Modified Independent             Mobility Comments: uses RW outside only; no history of falls ADLs Comments: staff assists with VC however will assist if needed; staff states that he likes to be independent        OT Problem List: Decreased activity tolerance;Decreased safety awareness;Decreased cognition;Decreased strength      OT Treatment/Interventions: Self-care/ADL training;Therapeutic exercise;DME and/or AE instruction;Therapeutic activities;Cognitive remediation/compensation;Patient/family education;Balance training    OT Goals(Current goals can be found in the care plan section) Acute Rehab OT Goals Patient Stated Goal: pt unable to state OT Goal Formulation: Patient unable to participate in goal setting Time For Goal Achievement: 08/22/22 Potential to Achieve Goals: Fair  OT Frequency: Min 2X/week    Co-evaluation              AM-PAC OT "6 Clicks" Daily Activity     Outcome Measure Help from another person eating meals?: Total Help from another person taking care of personal grooming?: Total Help from another person toileting, which includes using toliet, bedpan, or urinal?: Total Help from another person bathing (including washing, rinsing, drying)?: Total Help from another person to put on and taking off regular upper body clothing?: Total Help from another person to put on and taking off regular lower body clothing?: Total 6 Click Score: 6   End of Session Nurse Communication: Mobility status  Activity Tolerance: Patient limited by lethargy Patient left: in bed;with call bell/phone within  reach;with bed alarm set;Other (comment) (modified chair position)  OT Visit Diagnosis: Unsteadiness on feet (R26.81);History of falling (Z91.81);Other symptoms and signs involving cognitive function                Time: 1110-1135 OT Time Calculation (min): 25 min Charges:  OT General Charges $OT Visit: 1 Visit OT Evaluation $OT Eval Moderate Complexity: 1 Mod OT Treatments $Self Care/Home Management : 8-22 mins  Luisa Dago, OT/L   Acute OT Clinical Specialist Acute Rehabilitation Services Pager [phone] Office [phone]   [facility] 08/09/2022, 11:43 AM

## 2022-08-09 NOTE — ED Notes (Signed)
Full linen change provided at this time patient cleaned up and placed in clean gown

## 2022-08-09 NOTE — Evaluation (Addendum)
Clinical/Bedside Swallow Evaluation Patient Details  Name: Charles Fox MRN: [id number] Date of Birth: 1950/12/22  Today's Date: 08/09/2022 Time: SLP Start Time (ACUTE ONLY): 4098 SLP Stop Time (ACUTE ONLY): 1005 SLP Time Calculation (min) (ACUTE ONLY): 26 min  Past Medical History: History reviewed. No pertinent past medical history. Past Surgical History: History reviewed. No pertinent surgical history. HPI:  Per MD note, "71 yo M with hx dementia, DM2, HTN, prev CVA brought from his SNF after fall. He was out to smoke a cigarette and then found down. He had trauma to his face and had mental status change. Pt's CT head negative. EEG showed He received ativan earlier in day for his CT head.  Pt found to have mild nasal fracture and CT head showed left caudate head and anterior limb of the internal capsule, possibly also the left corona radiata.    He did not pass a yale swallow screen - and thus SLP swallow and speech eval ordered.    Assessment / Plan / Recommendation  Clinical Impression  Patient greeted in bed asleep but woke adequately to nod yes to desiring intake and staying his first name.  He did not follow directions for oral motor exam and was resistant to oral care - likely due to discomfort from his abrasions.  Pt did not seal his lips on a spoon despite total cues - therefore SLP placed single small ice chip in oral cavity.  Delayed oral manipulation present with significantly delayed swallow with pt needing total verbal/tactile cues to attend. Pt observed to open his eyes and look to SLP when walking away from him on several occasions - ? some behavioral component.   Tsp of water was not swallowed with 2 attempts - nor did pt expectorate per cue.    Recommend NPO x single ice chips when fully alert and accepting.   Pt apperas to wax/wane between sleepy and agitated = pulling at his mitts.  Oral suction set up for use with cautions Will follow up next date to check for po readiness  as hopefully his mentation will improve.  SLP Visit Diagnosis: Dysphagia, oral phase (R13.11);Dysphagia, unspecified (R13.10)    Aspiration Risk  Severe aspiration risk;Risk for inadequate nutrition/hydration    Diet Recommendation NPO  single small ice chip when fully alert only   Medication Administration: Via alternative means Postural Changes: Seated upright at 90 degrees    Other  Recommendations Oral Care Recommendations: Oral care QID Other Recommendations: Have oral suction available    Recommendations for follow up therapy are one component of a multi-disciplinary discharge planning process, led by the attending physician.  Recommendations may be updated based on patient status, additional functional criteria and insurance authorization.  Follow up Recommendations Follow physician's recommendations for discharge plan and follow up therapies      Assistance Recommended at Discharge  Full   Functional Status Assessment Patient has had a recent decline in their functional status and demonstrates the ability to make significant improvements in function in a reasonable and predictable amount of time.  Frequency and Duration min 1 x/week  1 week       Prognosis   Guarded     Swallow Study   General HPI: Per MD note, "71 yo M with hx dementia, DM2, HTN, prev CVA brought from his SNF after fall. He was out to smoke a cigarette and then found down. He had trauma to his face and had mental status change. Pt's CT head negative. EEG  showed He received ativan earlier in day for his CT head.  Pt found to have mild nasal fracture and CT head showed left caudate head and anterior limb of the internal capsule, possibly also the left corona radiata.    He did not pass a yale swallow screen - and thus SLP swallow and speech eval ordered. Type of Study: Bedside Swallow Evaluation Diet Prior to this Study: NPO Temperature Spikes Noted: No Respiratory Status: Room air History of Recent  Intubation: No Behavior/Cognition: Lethargic/Drowsy;Distractible;Other (Comment) (pt is sleepy or when alert - he is pulling at his mitts - attempting to remove them) Oral Cavity Assessment: Other (comment);Dried secretions (bloody secretions - dried and bright red - pt did not open mouth adequately to allow SLP to view) Oral Care Completed by SLP: Other (Comment) (attempted - pt did not allow - began chewing on his mitts) Oral Cavity - Dentition: Adequate natural dentition Vision: Impaired for self-feeding Self-Feeding Abilities: Total assist Patient Positioning: Upright in bed Volitional Cough: Cognitively unable to elicit Volitional Swallow: Unable to elicit    Oral/Motor/Sensory Function Overall Oral Motor/Sensory Function: Generalized oral weakness (pt did not follow directions - did not seal lips on spoon)   Ice Chips Ice chips: Impaired Presentation: Spoon Oral Phase Impairments: Poor awareness of bolus Oral Phase Functional Implications: Oral holding;Other (comment);Prolonged oral transit (clinically judged to have delayed swallow- suspect oropharynx) Pharyngeal Phase Impairments: Suspected delayed Swallow   Thin Liquid Thin Liquid: Impaired Presentation: Spoon;Self Fed;Cup Oral Phase Impairments: Poor awareness of bolus Oral Phase Functional Implications: Oral holding (pt did not orally transit bolus)    Nectar Thick Nectar Thick Liquid: Not tested   Honey Thick Honey Thick Liquid: Not tested   Puree Puree: Not tested   Solid     Solid: Not tested      Charles Fox 08/09/2022,10:38 AM  Charles Infante, MS [facility] SLP Acute Rehab Services Office [phone] Pager [phone]

## 2022-08-09 NOTE — Progress Notes (Addendum)
Neurology Progress Note    HISTORY OF PRESENT ILLNESS   Brief HPI  Patient is a 71 year old African-American gentlemanwith a past medical history of dementia, T2DM, HTN, HLD, CVA, housing insecurity, tobacco use who presented from his care home after a fall. Per report, patient was last seen normal this morning around 0600 when he went out to smoke a cigarette, however it is noted that he has his shoes on the wrong feet. He was found face down on the ground, altered, and aphasic and EMS was called. EMS reports he was agitated with them and had an episode of bloody emesis en route to the hospital. BP systolic 150s and glucose 137. He does have dried and fresh blood on his face. He is not speaking or following commands.   -------------- Interval history:  History is obtained from:chart review MRI without acute or subacute infarcts but a number of chronic infarcts.  CTA with diffuse intracranial and extracranial atherosclerosis EEG with diffuse slowing and triphasic waves Clinically, does not appear to have improved very much since yesterday.  He is somnolent, does not follow commands, but does answer some questions briefly.  Functional strength appears intact with antigravity movement of all 4 limbs noted Unclear reliability, but patient does admit to alcohol use within the last few days when specifically queried  POE:UMPNTI to obtain due to altered mental status.   PAST MEDICAL HISTORY    Past Medical History:  History reviewed. No pertinent past medical history.  No family history on file. History reviewed. No pertinent family history.  Allergies:  No Known Allergies  Social History:   has no history on file for tobacco use, alcohol use, and drug use.    Medications Medications Prior to Admission  Medication Sig Dispense Refill   aspirin EC 81 MG tablet Take 81 mg by mouth daily. Swallow whole.     atorvastatin (LIPITOR) 40 MG tablet Take 40 mg by mouth daily.     carvedilol  (COREG) 6.25 MG tablet Take 6.25 mg by mouth 2 (two) times daily with a meal.     citalopram (CELEXA) 40 MG tablet Take 40 mg by mouth daily.     finasteride (PROSCAR) 5 MG tablet Take 5 mg by mouth daily.     insulin glargine (LANTUS) 100 UNIT/ML Solostar Pen Inject 16 Units into the skin daily.     irbesartan (AVAPRO) 150 MG tablet Take 150 mg by mouth daily.     melatonin 5 MG TABS Take 5 mg by mouth at bedtime.     metFORMIN (GLUCOPHAGE-XR) 500 MG 24 hr tablet Take 1,000 mg by mouth 2 (two) times daily.     nitroGLYCERIN (NITROSTAT) 0.4 MG SL tablet Place 0.4 mg under the tongue every 5 (five) minutes as needed for chest pain.     olopatadine (PATANOL) 0.1 % ophthalmic solution Place 1 drop into both eyes 2 (two) times daily as needed for allergies (dry eyes).     polyethylene glycol (MIRALAX / GLYCOLAX) 17 g packet Take 17 g by mouth daily as needed for mild constipation.     QUEtiapine (SEROQUEL) 25 MG tablet Take 75 mg by mouth at bedtime.     senna (SENOKOT) 8.6 MG tablet Take 1 tablet by mouth daily.     tamsulosin (FLOMAX) 0.4 MG CAPS capsule Take 0.8 mg by mouth daily after breakfast.      EXAMINATION    Current vital signs:    08/09/2022    8:03 AM 08/09/2022  6:43 AM 08/09/2022    5:34 AM  Vitals with BMI  Height  6\' 0"    Systolic 179  191  Diastolic 92  93  Pulse 100      Examination:  GENERAL: Somnolent, in NAD HEENT: -Dried blood intraorally,no lymphadenopathy, no Thyromegally LUNGS - Clear to auscultation bilaterally CV - S1S2 RRR, equal pulses bilaterally. ABDOMEN - Soft, nontender, nondistended with normoactive BS Ext: warm, well perfused, intact peripheral pulses, no pedal edema  NEURO:  Mental Status: aaox 1 to name. Language: speech is dysarthric.  Altered mental status limits ability to test aphasia, as he does not follow further commands to evaluate naming, repetition, fluency, and comprehension. Cranial Nerves:  II:   Blinks to threat Right >  left III, IV, VI:  R 65mm, L 20mm pupils, EOM appear in tact, no obvious gaze preference. Eyelids elevate symmetrically.  V: Unable to assess VII: no facial asymmetry   VIII: hearing intact to voice IX, X: Unable to assessl.  3m to assess XII: Unable to assess Motor:  Unable to formally test due to altered mental status.  At least 3/5 strength in all 4 limbs and upper extremities, at least 4/5 strength with elbow extension, symmetrical Sensation  appears intact to noxious stimuli peripherally x 4 Tone: is normal and bulk is normal Coordination: Unable to assess, no abnormal movements  Gait- deferred  NIHSS 1 LOC 2 ques 2 commands 1visual  1 limbs x 4 2 dysarthria 3 aphasia  TOTAL 15, now worsening somnolence but better motor strength compared to yesterday's examination  LABS   I have reviewed labs in epic and the results pertinent to this consultation are:  Lab Results  Component Value Date   LDLCALC 64 08/09/2022   Lab Results  Component Value Date   ALT 15 08/08/2022   AST 25 08/08/2022   ALKPHOS 67 08/08/2022   BILITOT 0.7 08/08/2022   Lab Results  Component Value Date   HGBA1C 6.6 (H) 08/08/2022   Lab Results  Component Value Date   WBC 15.8 (H) 08/09/2022   HGB 12.4 (L) 08/09/2022   HCT 35.6 (L) 08/09/2022   MCV 88.3 08/09/2022   PLT 295 08/09/2022   Lab Results  Component Value Date   VITAMINB12 461 08/08/2022   Lab Results  Component Value Date   FOLATE 15.9 08/08/2022   Lab Results  Component Value Date   NA 134 (L) 08/08/2022   K 4.1 08/08/2022   CL 101 08/08/2022   CO2 21 (L) 08/08/2022   DIAGNOSTIC IMAGING/PROCEDURES  I have reviewed the images obtained:  CT-head Age-indeterminate infarcts in the left caudate head and anterior limb of the internal capsule, possibly also the left corona radiata. Recommend MRI for further evaluation. Aspects is 8.  CTA Head and Neck Severe atherosclerotic vasculopathy involving the intra  and extracranial vasculature.   Carotid arteries: 1.  Severe stenosis of the bilateral ICAs at the origin.   Vertebral arteries: 1. Irregular narrowing and focal severe stenosis of the proximal V4 segment of the left vertebral artery. 2. Multifocal regions of severe stenosis of the right vertebral artery, which terminates in a PICA, and is occluded, as above.   Anterior circulation: 1. Severe stenosis of the anterior cavernous portions of the bilateral ICAs. 2.  Severe stenosis of the left M2 inferior division. 3.  Moderate stenosis of the A2 segment of the right ACA. 4.  Focal filling defect of the A1/A2 junction of the left ACA. 5. There is otherwise  symmetric arborization of MCA branches in the bilateral MCA territories.   Posterior circulation  1. Severe nearly occlusive stenosis of the P2 segment of the left PCA. 2. Moderate to severe stenosis is also present in the P2 segment of the right PCA.   Other:  1.  Moderate to severe spinal canal narrowing at C4-C5 and C5-C6    CT Face Likely mildly displaced bilateral nasal bone fractures.   MRI brain without restricted diffusion to suggest recent infarction.  He has a number of remote lacunar infarcts left basal ganglia, corona radiata, and moderate small vessel ischemic disease.  Brie  EEG Description: No clear posterior dominant rhythm was seen.  EEG showed continuous generalized predominantly 6 to 7 Hz theta slowing.  Generalized periodic discharges with triphasic morphology at 1 Hz were also noted intermittently, more prominent when awake/stimulated.  Hyperventilation and photic stimulation were not performed.      ABNORMALITY - Periodic discharges with triphasic morphology, generalized ( GPDs) - Continuous slow, generalized   IMPRESSION: This study showed generalized periodic discharges with triphasic morphology at 1 Hz which can be on the ictal-interictal continuum.  However, the morphology, frequency and reactivity to  stimulation is more likely suggestive of toxic-metabolic causes.  Additionally there is moderate diffuse encephalopathy, nonspecific etiology.  No seizures were seen during the study.   ASSESSMENT/PLAN    Assessment/ Impression  71 y.o. male with a past medical history of dementia, T2DM, HTN, HLD, CVA, housing insecurity, tobacco use who presented from his care home after a fall.  Alteration in mental status, the patient shoes on the wrong feet, suggesting he was encephalopathic prior to his fall, found with facial trauma.  Etiology of altered mental status is not entirely clear, but it is not likely primarily neurological in nature based on examination, as EEG also supports a generalized encephalopathy +/- medication/drug-related changes. There is no current evidence for seizures, novel strokes or other intracranial lesions,RPR, B12, Folate, sed rate, TSH all within normal limits. He is with leukocytosis to 15.8 with improving lactic acidosis, and thus infectious etiologies being entertained. Low suspicion for HSV encephalitis based on non-focality on EEG and global rather than primarily aphasic examination.   Thus, differentials include most likely hypertensive encephalopathy (without evidence of PRES on MRI nor of seizure activity) versus syncope with resultant mechanical fall and possibly concussion +/- occult infection.  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    On my exam, he is slightly better than when Lambert ModyKristie Kehoe saw him earlier, he is able to tell me his name and follow simple commands to wiggle toes, and endorses seeing fingers wiggle in both sides.  He is not certain where he is.  His EEG demonstrated triphasic waves, consistent with toxic metabolic  etiology.  He continues to be hypertensive.  With a baseline MoCA of 12/30, I think a multifactorial delirium at the current time secondary to simply the medications and hospitalization independent of his initial inciting event is currently possible.  Unclear if something caused his fall versus a fall with concussion resulting in confusion.  He has no meningismus at all, I have very low suspicion for meningitis, but other occult infection could certainly contribute to delirium.  Neurology will continue to follow.  Ritta Slot, MD [facility] [phone]  If 7pm- 7am, please page neurology on call as listed in AMION.

## 2022-08-09 NOTE — Procedures (Signed)
Patient Name: Charles Fox  MRN: [id number]  Epilepsy Attending: Charlsie Quest  Referring Physician/Provider: Elmer Picker, NP  Date: 08/08/2022 Duration: 23.06 mins  Patient history: 71yo M with ams. EEG to evaluate for seizure  Level of alertness: Awake  AEDs during EEG study: None  Technical aspects: This EEG study was done with scalp electrodes positioned according to the 10-20 International system of electrode placement. Electrical activity was reviewed with band pass filter of 1-70Hz , sensitivity of 7 uV/mm, display speed of 63mm/sec with a 60Hz  notched filter applied as appropriate. EEG data were recorded continuously and digitally stored.  Video monitoring was available and reviewed as appropriate.  Description: No clear posterior dominant rhythm was seen.  EEG showed continuous generalized predominantly 6 to 7 Hz theta slowing.  Generalized periodic discharges with triphasic morphology at 1 Hz were also noted intermittently, more prominent when awake/stimulated.  Hyperventilation and photic stimulation were not performed.     ABNORMALITY - Periodic discharges with triphasic morphology, generalized ( GPDs) - Continuous slow, generalized  IMPRESSION: This study showed generalized periodic discharges with triphasic morphology at 1 Hz which can be on the ictal-interictal continuum.  However, the morphology, frequency and reactivity to stimulation is more likely suggestive of toxic-metabolic causes.  Additionally there is moderate diffuse encephalopathy, nonspecific etiology.  No seizures were seen during the study.  Charles Fox 

## 2022-08-09 NOTE — Inpatient Diabetes Management (Signed)
Inpatient Diabetes Program Recommendations  AACE/ADA: New Consensus Statement on Inpatient Glycemic Control (2015)  Target Ranges:  Prepandial:   less than 140 mg/dL      Peak postprandial:   less than 180 mg/dL (1-2 hours)      Critically ill patients:  140 - 180 mg/dL   Lab Results  Component Value Date   GLUCAP 244 (H) 08/09/2022   HGBA1C 6.6 (H) 08/08/2022    Review of Glycemic Control  Latest Reference Range & Units 08/08/22 08:01 08/08/22 17:10 08/08/22 23:07 08/09/22 06:52  Glucose-Capillary 70 - 99 mg/dL 301 (H) 601 (H) 093 (H) 244 (H)   Diabetes history: DM 2 Outpatient Diabetes medications: Lantus 16 units Daily, Metformin 1000 mg bid Current orders for Inpatient glycemic control:  Novolog 0-9 units tid  A1c 6.6% on 11/20  Inpatient Diabetes Program Recommendations:    Glucose trends in 200's. Pt on basal insulin at home  -  Add Semglee 10 units  Thanks,  Christena Deem RN, MSN, BC-ADM Inpatient Diabetes Coordinator Team Pager [phone] (8a-5p)

## 2022-08-10 ENCOUNTER — Inpatient Hospital Stay (HOSPITAL_COMMUNITY): Payer: Medicare Other

## 2022-08-10 DIAGNOSIS — I10 Essential (primary) hypertension: Secondary | ICD-10-CM

## 2022-08-10 DIAGNOSIS — N179 Acute kidney failure, unspecified: Secondary | ICD-10-CM

## 2022-08-10 LAB — BASIC METABOLIC PANEL
Anion gap: 14 (ref 5–15)
BUN: 23 mg/dL (ref 8–23)
CO2: 23 mmol/L (ref 22–32)
Calcium: 9 mg/dL (ref 8.9–10.3)
Chloride: 101 mmol/L (ref 98–111)
Creatinine, Ser: 1.41 mg/dL — ABNORMAL HIGH (ref 0.61–1.24)
GFR, Estimated: 54 mL/min — ABNORMAL LOW (ref >60)
Glucose, Bld: 211 mg/dL — ABNORMAL HIGH (ref 70–99)
Potassium: 4 mmol/L (ref 3.5–5.1)
Sodium: 138 mmol/L (ref 135–145)

## 2022-08-10 LAB — GLUCOSE, CAPILLARY
Glucose-Capillary: 248 mg/dL — ABNORMAL HIGH (ref 70–99)
Glucose-Capillary: 199 mg/dL — ABNORMAL HIGH (ref 70–99)
Glucose-Capillary: 180 mg/dL — ABNORMAL HIGH (ref 70–99)
Glucose-Capillary: 185 mg/dL — ABNORMAL HIGH (ref 70–99)

## 2022-08-10 LAB — CBC
HCT: 37.1 % — ABNORMAL LOW (ref 39.0–52.0)
Hemoglobin: 12.7 g/dL — ABNORMAL LOW (ref 13.0–17.0)
MCH: 30.5 pg (ref 26.0–34.0)
MCHC: 34.2 g/dL (ref 30.0–36.0)
MCV: 89.2 fL (ref 80.0–100.0)
Platelets: 272 10*3/uL (ref 150–400)
RBC: 4.16 MIL/uL — ABNORMAL LOW (ref 4.22–5.81)
RDW: 14.1 % (ref 11.5–15.5)
WBC: 17.7 10*3/uL — ABNORMAL HIGH (ref 4.0–10.5)
nRBC: 0 % (ref 0.0–0.2)

## 2022-08-10 LAB — LACTIC ACID, PLASMA: Lactic Acid, Venous: 5.7 mmol/L (ref 0.5–1.9)

## 2022-08-10 LAB — AMMONIA: Ammonia: 35 umol/L (ref 9–35)

## 2022-08-10 MED ORDER — ADULT MULTIVITAMIN W/MINERALS CH
1.0000 | ORAL_TABLET | Freq: Every day | ORAL | Status: DC
  Administered 2022-08-10 – 2022-08-13 (×4): 1 via ORAL
  Filled 2022-08-10 (×4): qty 1

## 2022-08-10 MED ORDER — ACETAMINOPHEN 650 MG RE SUPP
650.0000 mg | Freq: Four times a day (QID) | RECTAL | Status: DC
  Administered 2022-08-10 – 2022-08-11 (×2): 650 mg via RECTAL
  Filled 2022-08-10: qty 1

## 2022-08-10 MED ORDER — GLUCERNA SHAKE PO LIQD
237.0000 mL | Freq: Three times a day (TID) | ORAL | Status: DC
  Administered 2022-08-10: 237 mL via ORAL

## 2022-08-10 MED ORDER — ACETAMINOPHEN 160 MG/5ML PO SOLN
650.0000 mg | Freq: Four times a day (QID) | ORAL | Status: DC
  Administered 2022-08-10 – 2022-08-12 (×7): 650 mg via ORAL
  Filled 2022-08-10 (×7): qty 20.3

## 2022-08-10 MED ORDER — SODIUM CHLORIDE 0.9 % IV SOLN: INTRAVENOUS | Status: AC

## 2022-08-10 MED ORDER — SODIUM CHLORIDE 0.9 % IV SOLN
3.0000 g | Freq: Four times a day (QID) | INTRAVENOUS | Status: DC
  Administered 2022-08-10 – 2022-08-11 (×3): 3 g via INTRAVENOUS
  Filled 2022-08-10 (×3): qty 8

## 2022-08-10 NOTE — Progress Notes (Deleted)
   08/10/22 2006  Assess: MEWS Score  Temp 99.5 F (37.5 C)  Pulse Rate (!) 144  Resp (!) 36  Level of Consciousness Responds to Voice  SpO2 97 %  O2 Device Room Air  Assess: MEWS Score  MEWS Temp 0  MEWS Systolic 0  MEWS Pulse 3  MEWS RR 3  MEWS LOC 1  MEWS Score 7  MEWS Score Color Red  Assess: if the MEWS score is Yellow or Red  Were vital signs taken at a resting state? Yes  Focused Assessment No change from prior assessment  Does the patient meet 2 or more of the SIRS criteria? Yes  Does the patient have a confirmed or suspected source of infection? No  MEWS guidelines implemented *See Row Information* No, previously yellow, continue vital signs every 4 hours  Treat  MEWS Interventions Escalated (See documentation below)  Pain Scale PAINAD  Breathing 0  Negative Vocalization 0  Facial Expression 0  Body Language 0  Consolability 0  PAINAD Score 0  Take Vital Signs  Increase Vital Sign Frequency  Red: Q 1hr X 4 then Q 4hr X 4, if remains red, continue Q 4hrs  Escalate  MEWS: Escalate Red: discuss with charge nurse/RN and provider, consider discussing with RRT  Notify: Charge Nurse/RN  Date Charge Nurse/RN Notified 08/10/22  Time Charge Nurse/RN Notified 2044  Provider Notification  Provider Name/Title Dr. Rogers  Date Provider Notified 08/10/22  Time Provider Notified 2059  Method of Notification Call  Notification Reason Other (Comment) (Increased HR. Change in MEWs. Pt is unable to take po.)  Provider response At bedside (Give tylenol suppository early. EKG done, Lactic acid and blood culture obtained, CXR completed. Pt given ampicillin 3g IIVPB.)  Date of Provider Response 08/10/22  Time of Provider Response 2112  Notify: Rapid Response  Name of Rapid Response RN Notified David, RN  Date Rapid Response Notified 08/10/22  Time Rapid Response Notified 2100  Document  Patient Outcome Stabilized after interventions  Progress note created (see row info) Yes   Assess: SIRS CRITERIA  SIRS Temperature  0  SIRS Pulse 1  SIRS Respirations  1  SIRS WBC 1  SIRS Score Sum  3    

## 2022-08-10 NOTE — Progress Notes (Signed)
Subjective:  Patient still somnolent this morning but rouses to verbal stimuli and responsive, appropriately answering questions.   States that the last thing he remembers is feeling "real dizzy" when going out for a smoke the other day. He does not remember falling.   Objective:  Vital signs in last 24 hours: Vitals:   08/09/22 2007 08/09/22 2300 08/10/22 0348 08/10/22 0754  BP: (!) 193/90 (!) 185/102 (!) 188/99 (!) 162/88  Pulse: 98 91 90 94  Resp: 18 18 16 18   Temp: 98.1 F (36.7 C) 98.4 F (36.9 C) 98.9 F (37.2 C) 98.7 F (37.1 C)  TempSrc: Axillary Axillary Axillary Axillary  SpO2: 92% 99% 97% 100%  Weight:      Height:        Physical Exam:  Constitutional: elderly gentleman lying in bed, not in acute distress HEENT: Pressure dressing on chin laceration, not visualized at this time. Dried blood in mouth and on lips CV: Regular rate and rhythm, no murmurs, gallops Pulm: Normal work of breathing on room air, clear to auscultation bilaterally Abdominal: Normoactive bowel sounds, soft, non-tender, non-distended Extremities: moves all extremities spontaneously Neuro: alert & oriented to person, place, not able to name year. Somnolent, following commands.   Labs    Latest Ref Rng & Units 08/10/2022    7:21 AM 08/09/2022    4:08 AM 08/08/2022    4:21 PM  CBC  WBC 4.0 - 10.5 K/uL 17.7  15.8    Hemoglobin 13.0 - 17.0 g/dL 08/10/2022  69.6  78.9   Hematocrit 39.0 - 52.0 % 37.1  35.6  38.0   Platelets 150 - 400 K/uL 272  295         Latest Ref Rng & Units 08/10/2022    7:21 AM 08/08/2022    4:21 PM 08/08/2022    3:55 PM  CMP  Glucose 70 - 99 mg/dL 08/10/2022   017   BUN 8 - 23 mg/dL 23   11   Creatinine 510 - 1.24 mg/dL 2.58   5.27   Sodium 7.82 - 145 mmol/L 138  134  136   Potassium 3.5 - 5.1 mmol/L 4.0  4.1  4.2   Chloride 98 - 111 mmol/L 101   101   CO2 22 - 32 mmol/L 23   21   Calcium 8.9 - 10.3 mg/dL 9.0   8.9     Summary Mr. KOLBEY TEICHERT is a 71 yo M with a  PMH of dementia, T2DM, Htn, HLD, CVA, housing insecurity, and tobacco use disorder admitted for altered mental status after a fall.   Assessment/Plan:  Principal Problem:   Syncope Active Problems:   Altered mental status   Closed fracture of nasal bones   Controlled diabetes mellitus type 2 with complications (HCC)   Fall   Altered Mental Status Leukocytosis Patient is improved from yesterday as he is today responding to questions and has some memory of just before his fall, but continues to be somnolent. Anticipate he will become less somnolent as increasing interval since receiving ativan and haldol. Still unsure of etiology of his somnolence / altered mental status at this time. With patient stating he remembers feeling dizzy, more likely his fall consistent with a syncopal episode, plausibly due to vertebrobasilar insufficiency given extensive atherosclerotic vasculopathy or seizure due to EEG consistent with ictal or intraictal findings. Also considered hepatic encephalopathy given EEG findings and initially elevated ammonia, but ammonia normalized today, LFTs normal and patient has no history  of liver disease. Of note, patient's leukocytosis increased to 17.7 this morning, which could be related to facial trauma but also raises some concern for underlying infection. However, patient continues to be afebrile. Will recheck CBC tomorrow - am CBC  Hypertension Patient was been persistently hypertensive with systolic between 361-443 and diastolic between 15-400 overnight, despite receiving labetalol 20 @ 1815 and 10 at 0245. Patient currently NPO, once he advances diet will restart home meds. Until then, will continue IV labetalol 10mg  q4 and focus on pain control.   - 10mg  labetalol q4, will hold for for systolic <140 and/or diastolic <90 and HR <60.  - acetaminophen q6h  - IV toradol for pain prn - SLP eval  AKI Given patient has been somnolent, likely a prerenal AKI due to dehydration.  Patient did receive ~1L LR yesterday, but likely requires more fluids. Will start mIVF NS 125/hr for 12 hours and monitor I/O. Check BMP tomorrow. - NS 167mL/hr for 12 hours - am BMP  Facial Trauma Re-read of CT deemed to have no nasal fracture. Continue to manage conservatively with pain management. Nursing concerned for hematoma on chin in location of laceration, appeared stable from exam yesterday. Will continue to monitor   T2DM Last hbA1c 6.6%. During last 24 hours, fasting CBG 211-248, patient has received SSI 3U x3 - SSI 0-15 U while patient remains NPO     DIET: NPO IVF: NS 162mL/hr for 12 hrs DVT PPX: lovenox BOWEL: none CODE: full FAM COM: spoke with guardian yesterday   PT/OT recs: currently following Dispo: snf v back to group home Barriers to discharge: clinical stability    LOS: 2 days   09-30-1993, Medical Student 08/10/2022, 10:31 AM

## 2022-08-10 NOTE — Progress Notes (Signed)
Inpatient Diabetes Program Recommendations  AACE/ADA: New Consensus Statement on Inpatient Glycemic Control (2015)  Target Ranges:  Prepandial:   less than 140 mg/dL      Peak postprandial:   less than 180 mg/dL (1-2 hours)      Critically ill patients:  140 - 180 mg/dL   Lab Results  Component Value Date   GLUCAP 248 (H) 08/10/2022   HGBA1C 6.6 (H) 08/08/2022    Review of Glycemic Control  Latest Reference Range & Units 08/09/22 13:50 08/09/22 17:02 08/09/22 21:33 08/10/22 06:38  Glucose-Capillary 70 - 99 mg/dL 017 (H) 494 (H) 496 (H) 248 (H)   Diabetes history: DM 2 Outpatient Diabetes medications: Lantus 16 units Daily, Metformin 1000 mg bid Current orders for Inpatient glycemic control:  Novolog 0-9 units tid Inpatient Diabetes Program Recommendations:    Consider adding Semglee 10 units daily.   Thanks,  Beryl Meager, RN, BC-ADM Inpatient Diabetes Coordinator Pager [phone]  (8a-5p)

## 2022-08-10 NOTE — Progress Notes (Addendum)
Pt adm 08/08/22 for syncopal episode. He has leucocytosis. BP= 102/65. HR=140's Sinus tachy prior to my shift. RR=30s. POX=97 % RA. Temp is 99.4 F axillary. Pt responses to his name. He sleepy and lethargic on and off which has been ongoing since day shift. Pt's mews is red it was green before and yellow at change of shift. Pt is unable to take pills. NS is infusing at 141ml/hr.  I spoke with Dr. Aundria Rud about the above information.

## 2022-08-10 NOTE — Progress Notes (Signed)
Neurology Progress Note    HISTORY OF PRESENT ILLNESS   Brief HPI  Patient is a 71 year old African-American gentlemanwith a past medical history of dementia, T2DM, HTN, HLD, CVA, housing insecurity, tobacco use who presented from his care home after a fall. Per report, patient was last seen normal this morning around 0600 when he went out to smoke a cigarette, however it is noted that he has his shoes on the wrong feet. He was found face down on the ground, altered, and aphasic and EMS was called. EMS reports he was agitated with them and had an episode of bloody emesis en route to the hospital. BP systolic 150s and glucose 137. He does have dried and fresh blood on his face. He is not speaking or following commands.   -------------- Interval history:  Patient appears somewhat improved today, alert and oriented to person and place.  He is more alert and able to answer questions and follow commands.  He can follow one-step, but not two-step commands.  Exam is nonfocal, with good antigravity strength in all 4 extremities.  EVO:[id number] to obtain due to altered mental status.   PAST MEDICAL HISTORY    Past Medical History:  History reviewed. No pertinent past medical history.  No family history on file. History reviewed. No pertinent family history.  Allergies:  No Known Allergies  Social History:   has no history on file for tobacco use, alcohol use, and drug use.    Medications Medications Prior to Admission  Medication Sig Dispense Refill   aspirin EC 81 MG tablet Take 81 mg by mouth daily. Swallow whole.     atorvastatin (LIPITOR) 40 MG tablet Take 40 mg by mouth daily.     carvedilol (COREG) 6.25 MG tablet Take 6.25 mg by mouth 2 (two) times daily with a meal.     citalopram (CELEXA) 40 MG tablet Take 40 mg by mouth daily.     finasteride (PROSCAR) 5 MG tablet Take 5 mg by mouth daily.     insulin glargine (LANTUS) 100 UNIT/ML Solostar Pen Inject 16 Units into the skin daily.      irbesartan (AVAPRO) 150 MG tablet Take 150 mg by mouth daily.     melatonin 5 MG TABS Take 5 mg by mouth at bedtime.     metFORMIN (GLUCOPHAGE-XR) 500 MG 24 hr tablet Take 1,000 mg by mouth 2 (two) times daily.     nitroGLYCERIN (NITROSTAT) 0.4 MG SL tablet Place 0.4 mg under the tongue every 5 (five) minutes as needed for chest pain.     olopatadine (PATANOL) 0.1 % ophthalmic solution Place 1 drop into both eyes 2 (two) times daily as needed for allergies (dry eyes).     polyethylene glycol (MIRALAX / GLYCOLAX) 17 g packet Take 17 g by mouth daily as needed for mild constipation.     QUEtiapine (SEROQUEL) 25 MG tablet Take 75 mg by mouth at bedtime.     senna (SENOKOT) 8.6 MG tablet Take 1 tablet by mouth daily.     tamsulosin (FLOMAX) 0.4 MG CAPS capsule Take 0.8 mg by mouth daily after breakfast.      EXAMINATION    Current vital signs:    08/10/2022    7:54 AM 08/10/2022    3:48 AM 08/09/2022   11:00 PM  Vitals with BMI  Systolic 162 188 818  Diastolic 88 99 102  Pulse 94 90 91    Examination:  GENERAL: Well-nourished, well-developed elderly patient in bed with eyes  closed, but alert and responsive to questions HEENT: -Dried blood intraorally CV - S1S2 RRR, equal pulses bilaterally. Ext: warm, well perfused, with no injury or obvious deformity  NEURO:  Mental Status: aaox 2 to person and place Language: speech is clear and fluent Cranial Nerves:  II:   PERRL, 45mm bilaterally III, IV, VI:  EOMI, no obvious gaze preference. Eyelids elevate symmetrically.  V: Facial sensation intact VII: no facial asymmetry   VIII: hearing intact to voice IX, X: Phonation normal XI: Shoulder shrug symmetric with good strength XII: Tongue midline Motor:  Good antigravity strength in all 4 extremities Sensation  Intact to light touch throughout Tone: is normal and bulk is normal Coordination: Unable to assess, no abnormal movements  Gait- deferred Reflexes 2+ throughout  LABS    I have reviewed labs in epic and the results pertinent to this consultation are:  Lab Results  Component Value Date   LDLCALC 64 08/09/2022   Lab Results  Component Value Date   ALT 15 08/08/2022   AST 25 08/08/2022   ALKPHOS 67 08/08/2022   BILITOT 0.7 08/08/2022   Lab Results  Component Value Date   HGBA1C 6.6 (H) 08/08/2022   Lab Results  Component Value Date   WBC 17.7 (H) 08/10/2022   HGB 12.7 (L) 08/10/2022   HCT 37.1 (L) 08/10/2022   MCV 89.2 08/10/2022   PLT 272 08/10/2022   Lab Results  Component Value Date   VITAMINB12 461 08/08/2022   Lab Results  Component Value Date   FOLATE 15.9 08/08/2022   Lab Results  Component Value Date   NA 134 (L) 08/08/2022   K 4.1 08/08/2022   CL 101 08/08/2022   CO2 21 (L) 08/08/2022   DIAGNOSTIC IMAGING/PROCEDURES  I have reviewed the images obtained:  CT-head Age-indeterminate infarcts in the left caudate head and anterior limb of the internal capsule, possibly also the left corona radiata. Recommend MRI for further evaluation. Aspects is 8.  CTA Head and Neck Severe atherosclerotic vasculopathy involving the intra and extracranial vasculature.   Carotid arteries: 1.  Severe stenosis of the bilateral ICAs at the origin.   Vertebral arteries: 1. Irregular narrowing and focal severe stenosis of the proximal V4 segment of the left vertebral artery. 2. Multifocal regions of severe stenosis of the right vertebral artery, which terminates in a PICA, and is occluded, as above.   Anterior circulation: 1. Severe stenosis of the anterior cavernous portions of the bilateral ICAs. 2.  Severe stenosis of the left M2 inferior division. 3.  Moderate stenosis of the A2 segment of the right ACA. 4.  Focal filling defect of the A1/A2 junction of the left ACA. 5. There is otherwise symmetric arborization of MCA branches in the bilateral MCA territories.   Posterior circulation  1. Severe nearly occlusive stenosis of the P2  segment of the left PCA. 2. Moderate to severe stenosis is also present in the P2 segment of the right PCA.   Other:  1.  Moderate to severe spinal canal narrowing at C4-C5 and C5-C6    CT Face Likely mildly displaced bilateral nasal bone fractures.   MRI brain without restricted diffusion to suggest recent infarction.  He has a number of remote lacunar infarcts left basal ganglia, corona radiata, and moderate small vessel ischemic disease.    EEG Description: No clear posterior dominant rhythm was seen.  EEG showed continuous generalized predominantly 6 to 7 Hz theta slowing.  Generalized periodic discharges with triphasic morphology at  1 Hz were also noted intermittently, more prominent when awake/stimulated.  Hyperventilation and photic stimulation were not performed.      ABNORMALITY - Periodic discharges with triphasic morphology, generalized ( GPDs) - Continuous slow, generalized   IMPRESSION: This study showed generalized periodic discharges with triphasic morphology at 1 Hz which can be on the ictal-interictal continuum.  However, the morphology, frequency and reactivity to stimulation is more likely suggestive of toxic-metabolic causes.  Additionally there is moderate diffuse encephalopathy, nonspecific etiology.  No seizures were seen during the study.   ASSESSMENT/PLAN    Assessment/ Impression  70 y.o. male with a past medical history of dementia, T2DM, HTN, HLD, CVA, housing insecurity, tobacco use who presented from his care home after a fall.  Alteration in mental status, the patient shoes on the wrong feet, suggesting he was encephalopathic prior to his fall, found with facial trauma.  Etiology of altered mental status is not entirely clear, but it is not likely primarily neurological in nature based on examination, as EEG also supports a generalized encephalopathy +/- medication/drug-related changes. There is no current evidence for seizures, novel strokes or other  intracranial lesions,RPR, B12, Folate, sed rate, TSH all within normal limits.   Thus, differentials include most likely hypertensive encephalopathy (without evidence of PRES on MRI nor of seizure activity) versus syncope with resultant mechanical fall and possibly concussion +/- occult infection.  Delirium is also on the differential.  Patient appears to be improving today, and is oriented to self and place.  Favor conservative approach with delirium precautions and continued work-up for toxic metabolic encephalopathy.  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  He does appear to be making progress.  It is unclear exactly what precipitated his initial fall, though seizure would be in the differential, very little supporting evidence and with a negative EEG I would not favor starting antiepileptics.  He has no headache, has been afebrile, and no meningismus, so CNS infection is very unlikely.  With his gradual improvement, I would expect a continued slow improvement towards baseline.  I think that concussion with postconcussive encephalopathy in the setting of severe dementia is actually fairly  likely diagnosis, though difficult to confirm  As long as he continues making progress, I am not certain that I have other diagnostic testing that would be at all likely to yield further information, and so I would counsel continue supportive care.  No further recommendations at this time, please call neurology with any further questions or concerns.  Ritta Slot, MD [facility] [phone]  If 7pm- 7am, please page neurology on call as listed in AMION.

## 2022-08-10 NOTE — Plan of Care (Signed)
  Problem: Education: Goal: Knowledge of disease or condition will improve Outcome: Not Progressing Goal: Knowledge of secondary prevention will improve (MUST DOCUMENT ALL) Outcome: Not Progressing Goal: Knowledge of patient specific risk factors will improve Loraine Leriche N/A or DELETE if not current risk factor) Outcome: Not Progressing   Problem: Ischemic Stroke/TIA Tissue Perfusion: Goal: Complications of ischemic stroke/TIA will be minimized Outcome: Not Progressing   Problem: Coping: Goal: Will verbalize positive feelings about self Outcome: Not Progressing Goal: Will identify appropriate support needs Outcome: Not Progressing   Problem: Health Behavior/Discharge Planning: Goal: Ability to manage health-related needs will improve Outcome: Not Progressing Goal: Goals will be collaboratively established with patient/family Outcome: Not Progressing   Problem: Self-Care: Goal: Ability to participate in self-care as condition permits will improve Outcome: Not Progressing Goal: Verbalization of feelings and concerns over difficulty with self-care will improve Outcome: Not Progressing Goal: Ability to communicate needs accurately will improve Outcome: Not Progressing   Problem: Nutrition: Goal: Risk of aspiration will decrease Outcome: Not Progressing Goal: Dietary intake will improve Outcome: Not Progressing   Problem: Safety: Goal: Non-violent Restraint(s) Outcome: Not Progressing   Problem: Education: Goal: Ability to describe self-care measures that may prevent or decrease complications (Diabetes Survival Skills Education) will improve Outcome: Not Progressing Goal: Individualized Educational Video(s) Outcome: Not Progressing   Problem: Coping: Goal: Ability to adjust to condition or change in health will improve Outcome: Not Progressing   Problem: Fluid Volume: Goal: Ability to maintain a balanced intake and output will improve Outcome: Not Progressing   Problem:  Health Behavior/Discharge Planning: Goal: Ability to identify and utilize available resources and services will improve Outcome: Not Progressing Goal: Ability to manage health-related needs will improve Outcome: Not Progressing   Problem: Metabolic: Goal: Ability to maintain appropriate glucose levels will improve Outcome: Not Progressing   Problem: Nutritional: Goal: Maintenance of adequate nutrition will improve Outcome: Not Progressing Goal: Progress toward achieving an optimal weight will improve Outcome: Not Progressing   Problem: Skin Integrity: Goal: Risk for impaired skin integrity will decrease Outcome: Not Progressing   Problem: Tissue Perfusion: Goal: Adequacy of tissue perfusion will improve Outcome: Not Progressing   Problem: Education: Goal: Knowledge of General Education information will improve Description: Including pain rating scale, medication(s)/side effects and non-pharmacologic comfort measures Outcome: Not Progressing   Problem: Health Behavior/Discharge Planning: Goal: Ability to manage health-related needs will improve Outcome: Not Progressing   Problem: Clinical Measurements: Goal: Ability to maintain clinical measurements within normal limits will improve Outcome: Not Progressing Goal: Will remain free from infection Outcome: Not Progressing Goal: Diagnostic test results will improve Outcome: Not Progressing Goal: Respiratory complications will improve Outcome: Not Progressing Goal: Cardiovascular complication will be avoided Outcome: Not Progressing   Problem: Activity: Goal: Risk for activity intolerance will decrease Outcome: Not Progressing   Problem: Nutrition: Goal: Adequate nutrition will be maintained Outcome: Not Progressing   Problem: Coping: Goal: Level of anxiety will decrease Outcome: Not Progressing   Problem: Elimination: Goal: Will not experience complications related to bowel motility Outcome: Not  Progressing Goal: Will not experience complications related to urinary retention Outcome: Not Progressing   Problem: Pain Managment: Goal: General experience of comfort will improve Outcome: Not Progressing   Problem: Safety: Goal: Ability to remain free from injury will improve Outcome: Not Progressing   Problem: Skin Integrity: Goal: Risk for impaired skin integrity will decrease Outcome: Not Progressing

## 2022-08-10 NOTE — Progress Notes (Signed)
Occupational Therapy Treatment Patient Details Name: Charles Fox MRN: [id number] DOB: [birth year] Today's Date: 08/10/2022   History of present illness Mr. TAMARIUS EDELSTEIN is a 71 yo M admittted 11/20 after being found down outside of his group home.  PMH of dementia, T2DM, Htn, HLD, CVA, housing insecurity, and tobacco use disorder   OT comments  Pt making good progress. Able to ambulate with min A @ RW level @ 80 ft. Increased ability to participate with ADL tasks and conversation while sitting EOB with S. Pt is continent at baseline - if pt demonstrates increased restlessness, recommend offering to take pt to the bathroom @ RW level. Per conversation with staff at group home, they are able to help pt with ADL tasks and mobility. Recommend DC home to familiar environment with HHOT.Will follow.    Recommendations for follow up therapy are one component of a multi-disciplinary discharge planning process, led by the attending physician.  Recommendations may be updated based on patient status, additional functional criteria and insurance authorization.    Follow Up Recommendations  Other (comment) (Return to Care Home wiht HHOT)     Assistance Recommended at Discharge Frequent or constant Supervision/Assistance  Patient can return home with the following  A little help with walking and/or transfers;A little help with bathing/dressing/bathroom;Direct supervision/assist for medications management;Direct supervision/assist for financial management;Assistance with cooking/housework;Assist for transportation;Help with stairs or ramp for entrance   Equipment Recommendations  None recommended by OT    Recommendations for Other Services      Precautions / Restrictions Precautions Precautions: Fall Precaution Comments: watch BP Restrictions Weight Bearing Restrictions: No       Mobility Bed Mobility Overal bed mobility: Needs Assistance Bed Mobility: Supine to Sit, Sit to Supine      Supine to sit: Min assist Sit to supine: Min guard   General bed mobility comments: min assist to elevate trunk and cues to scoot to EOB    Transfers Overall transfer level: Needs assistance Equipment used: Rolling walker (2 wheels) Transfers: Sit to/from Stand Sit to Stand: Min assist, +2 safety/equipment                 Balance Overall balance assessment: Needs assistance Sitting-balance support: No upper extremity supported, Feet supported Sitting balance-Leahy Scale: Good     Standing balance support: No upper extremity supported Standing balance-Leahy Scale: Fair                             ADL either performed or assessed with clinical judgement   ADL Overall ADL's : Needs assistance/impaired Eating/Feeding: Supervision/ safety;Set up   Grooming: Minimal assistance   Upper Body Bathing: Moderate assistance   Lower Body Bathing: Moderate assistance   Upper Body Dressing : Moderate assistance   Lower Body Dressing: Maximal assistance;Sit to/from stand   Toilet Transfer: Minimal assistance;+2 for safety/equipment           Functional mobility during ADLs: Minimal assistance;+2 for safety/equipment      Extremity/Trunk Assessment Upper Extremity Assessment Upper Extremity Assessment: Generalized weakness (but using funcitonally)            Vision   Additional Comments: does not complain of double vision; improved ability to maintain eye contact today   Perception     Praxis      Cognition Arousal/Alertness: Awake/alert Behavior During Therapy: Impulsive, Restless Overall Cognitive Status: Impaired/Different from baseline Area of Impairment: Attention, Awareness, Problem solving (inconsistently following  commands; hx of dementia;)                               General Comments: improved ability to attend and participate in therapy today. Level of arousal significatnlyimporved byupright posture. Pt following 1 steps  commands and appropriate throughout session. More verbal today.        Exercises      Shoulder Instructions       General Comments HR up to 121 with activity    Pertinent Vitals/ Pain       Pain Assessment Pain Assessment: Faces Faces Pain Scale: No hurt  Home Living                                          Prior Functioning/Environment              Frequency  Min 2X/week        Progress Toward Goals  OT Goals(current goals can now be found in the care plan section)  Progress towards OT goals: Progressing toward goals  Acute Rehab OT Goals Patient Stated Goal: to walk OT Goal Formulation: Patient unable to participate in goal setting Time For Goal Achievement: 08/22/22 Potential to Achieve Goals: Fair  Plan Discharge plan remains appropriate    Co-evaluation                 AM-PAC OT "6 Clicks" Daily Activity     Outcome Measure   Help from another person eating meals?: A Lot Help from another person taking care of personal grooming?: A Little Help from another person toileting, which includes using toliet, bedpan, or urinal?: Total Help from another person bathing (including washing, rinsing, drying)?: A Lot Help from another person to put on and taking off regular upper body clothing?: A Lot Help from another person to put on and taking off regular lower body clothing?: A Lot 6 Click Score: 12    End of Session Equipment Utilized During Treatment: Gait belt;Rolling walker (2 wheels)  OT Visit Diagnosis: Unsteadiness on feet (R26.81);History of falling (Z91.81);Other symptoms and signs involving cognitive function   Activity Tolerance Patient tolerated treatment well   Patient Left in bed;with call bell/phone within reach;Other (comment) (working wihtST)   Nurse Communication Mobility status        Time: [phone] OT Time Calculation (min): 21 min  Charges: OT General Charges $OT Visit: 1 Visit OT  Treatments $Self Care/Home Management : 8-22 mins  Luisa Dago, OT/L   Acute OT Clinical Specialist Acute Rehabilitation Services Pager [phone] Office [phone]   [facility] 08/10/2022, 3:27 PM

## 2022-08-10 NOTE — Progress Notes (Signed)
Physical Therapy Treatment Patient Details Name: Charles Fox MRN: [id number] DOB: 08-02-1951 Today's Date: 08/10/2022   History of Present Illness [name] is a 71 yo M admittted 11/20 after being found down outside of his group home.  PMH of dementia, T2DM, Htn, HLD, CVA, housing insecurity, and tobacco use disorder    PT Comments    Pt received supine and agreeable to session with progress towards acute goals. Pt more awake/alert this date, able to complete bed mobility with min assist and following commands throughout for safety awareness. Pt able to come to stand multiple times from EOB with up to min assist to steady on rise as pt with tendency for posterior LOB and sitting quickly on EOB. Pt able to demonstrate short bout of gait in room with min guard with no AD, with distance limited to pt stated tolerance secondary to fatigue. Anticipate pt to progress well as cognition improves. Pt continues to benefit from skilled PT services to progress toward functional mobility goals.    Recommendations for follow up therapy are one component of a multi-disciplinary discharge planning process, led by the attending physician.  Recommendations may be updated based on patient status, additional functional criteria and insurance authorization.  Follow Up Recommendations  Skilled nursing-short term rehab (<3 hours/day) (TBA once meds clear) Can patient physically be transported by private vehicle: Yes   Assistance Recommended at Discharge Frequent or constant Supervision/Assistance  Patient can return home with the following A little help with walking and/or transfers;A little help with bathing/dressing/bathroom;Assistance with cooking/housework;Assist for transportation;Help with stairs or ramp for entrance   Equipment Recommendations  Other (comment) (TBA)    Recommendations for Other Services       Precautions / Restrictions Precautions Precautions: Fall Precaution Comments:  watch BP Restrictions Weight Bearing Restrictions: No     Mobility  Bed Mobility Overal bed mobility: Needs Assistance Bed Mobility: Supine to Sit, Sit to Supine     Supine to sit: Min assist Sit to supine: Min guard   General bed mobility comments: min assist to elevate trunk and cues to scoot to EOB    Transfers Overall transfer level: Needs assistance Equipment used: None Transfers: Sit to/from Stand Sit to Stand: Min guard, Min assist           General transfer comment: min assist to steady on rise, pt with multiple STS throughout (x8)    Ambulation/Gait Ambulation/Gait assistance: Min assist Gait Distance (Feet): 10 Feet Assistive device: None Gait Pattern/deviations: Step-through pattern, Decreased stride length, Wide base of support Gait velocity: decr     General Gait Details: short bout of gait in room with distance limited to pt stted tolerance secondary to fatigue   Stairs             Wheelchair Mobility    Modified Rankin (Stroke Patients Only)       Balance Overall balance assessment: Needs assistance Sitting-balance support: No upper extremity supported, Feet supported Sitting balance-Leahy Scale: Poor Sitting balance - Comments: Pt impulsive and needs min guard assist for safety.   Standing balance support: No upper extremity supported Standing balance-Leahy Scale: Fair Standing balance comment: able to static stand at EOB without support                            Cognition Arousal/Alertness: Awake/alert Behavior During Therapy: Impulsive, Restless Overall Cognitive Status: Impaired/Different from baseline Area of Impairment:  (inconsistently following commands; hx  of dementia;)                               General Comments: intermittent confusion; able to carry on conversations with staff        Exercises      General Comments General comments (skin integrity, edema, etc.): HR up to 121 with  activity      Pertinent Vitals/Pain Pain Assessment Pain Assessment: Faces Faces Pain Scale: No hurt    Home Living                          Prior Function            PT Goals (current goals can now be found in the care plan section) Acute Rehab PT Goals Patient Stated Goal: unable to state PT Goal Formulation: With patient Time For Goal Achievement: 08/22/22    Frequency    Min 3X/week      PT Plan      Co-evaluation              AM-PAC PT "6 Clicks" Mobility   Outcome Measure  Help needed turning from your back to your side while in a flat bed without using bedrails?: None Help needed moving from lying on your back to sitting on the side of a flat bed without using bedrails?: None Help needed moving to and from a bed to a chair (including a wheelchair)?: A Little Help needed standing up from a chair using your arms (e.g., wheelchair or bedside chair)?: A Little Help needed to walk in hospital room?: A Lot Help needed climbing 3-5 steps with a railing? : A Lot 6 Click Score: 18    End of Session Equipment Utilized During Treatment: Gait belt Activity Tolerance: Patient limited by fatigue Patient left: with call bell/phone within reach;in bed;with bed alarm set;with restraints reapplied (mits) Nurse Communication: Mobility status PT Visit Diagnosis: Unsteadiness on feet (R26.81);Muscle weakness (generalized) (M62.81)     Time: 0102-7253 PT Time Calculation (min) (ACUTE ONLY): 14 min  Charges:  $Gait Training: 8-22 mins                     Ashima Shrake R. PTA Acute Rehabilitation Services Office: [phone]    Catalina Antigua 08/10/2022, 2:54 PM

## 2022-08-10 NOTE — Progress Notes (Signed)
[place] needed for IV placement, patient pulled IV shortly after. New orders for IV team placed

## 2022-08-10 NOTE — Progress Notes (Signed)
Initial Nutrition Assessment  DOCUMENTATION CODES:  Not applicable  INTERVENTION:  Continue current diet as ordered by SLP, add carb modified to order Feeding assistance while pt is with AMS MVI with minerals daily Glucerna Shake po TID, each supplement provides 220 kcal and 10 grams of protein  NUTRITION DIAGNOSIS:  Inadequate oral intake related to lethargy/confusion as evidenced by meal completion < 50%.  GOAL:  Patient will meet greater than or equal to 90% of their needs  MONITOR:  PO intake, Supplement acceptance, Labs  REASON FOR ASSESSMENT:  Malnutrition Screening Tool    ASSESSMENT:  Pt with hx of dementia, DM type 2, HTN, HLD, tobacco use, and prior CVA presented to ED after pt fell face forward onto concrete.   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    Pet resting in bed at the time of visit, lunch tray at bedside untouched. Pt remains confused, unable to provide a hx. Denies GI distress or trouble chewing. States he wants to go home. Noted mittens in place, pt will need assistance with feeding until mentation is back to baseline.    NUTRITION - FOCUSED PHYSICAL EXAM: Flowsheet Row Most Recent Value  Orbital Region No depletion  Upper Arm Region No depletion  Thoracic and Lumbar Region No depletion  Buccal Region No depletion  Temple Region No depletion  Clavicle Bone Region No depletion  Clavicle and Acromion Bone Region No depletion  Scapular Bone Region No depletion  Dorsal Hand No depletion  Patellar Region No depletion  Anterior Thigh Region No depletion  Posterior Calf Region No depletion  Edema (RD Assessment) None  Hair Reviewed  Eyes Reviewed  Mouth Reviewed  Skin Reviewed  Nails Reviewed   Diet Order:   Diet Order             DIET DYS 2 Room service appropriate? No; Fluid consistency: Thin  Diet  effective now                   EDUCATION NEEDS:  Not appropriate for education at this time  Skin:  Skin Assessment: Reviewed RN Assessment (laceration to the upper lip)  Last BM:  11/21  Height:  Ht Readings from Last 1 Encounters:  08/09/22 6' (1.829 m)    Weight:  Wt Readings from Last 1 Encounters:  08/09/22 102.8 kg    Ideal Body Weight:  80.9 kg  BMI:  Body mass index is 30.74 kg/m.  Estimated Nutritional Needs:  Kcal:  2000-2200 kcal/d Protein:  100-115 g/d Fluid:  2-2.2L/d    Greig Castilla, RD, LDN Clinical Dietitian RD pager # available in [facility]  After hours/weekend pager # available in [facility]

## 2022-08-10 NOTE — Social Work (Addendum)
CSW attempted to contact the group home and DSS Guardian, VM's left. Rec is for SNF, PT to work with pt again today. Hope for improvement and return to group Home. CSW to fax pt out for SNF options, in case improvements are not made. Will continue to attempt to contact group home and guardian. Will follow for PT recommendations.  Pt does not have SS# listed in chart!!

## 2022-08-10 NOTE — Progress Notes (Addendum)
Speech Language Pathology Treatment: Dysphagia  Patient Details Name: Charles Fox MRN: [id number] DOB: April 25, 1951 Today's Date: 08/10/2022 Time: 2706-2376 SLP Time Calculation (min) (ACUTE ONLY): 11 min  Assessment / Plan / Recommendation Clinical Impression  Charles Fox was seen for dysphagia treatment. He was alert and cooperative during the session, but alertness waned towards the end of the session when stimuli were removed. Charles Fox demonstrated improved swallow function and prompts were not necessary for bolus manipulation/swallowing. Charles Fox reported that he often needs his meats to be chopped/ground. Charles Fox's son was called for verification of the Charles Fox's reports, but the son could not be reached. Charles Fox demonstrated prolonged mastication, mild oral residue and a single instance of coughing with six consecutive swallows of thin liquids via straw, but this was not observed with prompts to reduce intake rate. A dysphagia 2 diet with thin liquids is recommended at this time with observance of precautions; SLP will follow to ensure tolerance and for advancement as clinically indicated.    HPI HPI: Charles Fox is a 71 yo M admittted 11/20 after being found down outside of his group home. Charles Fox found to have mild nasal fracture MRI brain 11/20 negative for acute changes. PMH: dementia, T2DM, Htn, HLD, CVA, housing insecurity, and tobacco use disorder. BSE 05/09/21: regular texture diet and thin liquids recommended without need for follow up.      SLP Plan  Continue with current plan of care      Recommendations for follow up therapy are one component of a multi-disciplinary discharge planning process, led by the attending physician.  Recommendations may be updated based on patient status, additional functional criteria and insurance authorization.    Recommendations  Diet recommendations: Thin liquid;Dysphagia 2 (fine chop) Liquids provided via: Cup;Straw Medication Administration: Crushed with puree Supervision: Full  supervision/cueing for compensatory strategies;Trained caregiver to feed patient Compensations: Small sips/bites;Slow rate Postural Changes and/or Swallow Maneuvers: Seated upright 90 degrees                Oral Care Recommendations: Oral care BID Follow Up Recommendations: Skilled nursing-short term rehab (<3 hours/day) Assistance recommended at discharge: Frequent or constant Supervision/Assistance SLP Visit Diagnosis: Dysphagia, unspecified (R13.10) Plan: Continue with current plan of care         Anthone Prieur I. Vear Clock, MS, CCC-SLP Acute Rehabilitation Services Office number [phone]  Scheryl Marten  08/10/2022, 1:43 PM

## 2022-08-10 NOTE — Progress Notes (Signed)
Called by RN for sinus tachy to the 140s and BP to 102/65. Reviewed tele and appeared to be all sinus tachycardia that started around 6 pm. Went to evaluate the patient and he was somnolent with minimal yes/no answers which appears to be his baseline, tachypnic to 36 with increased WOB, LCTAB at the anterior listening posts, cool clammy skin. He answers no to pain or SOB. Repeat vitals in the room were significant for fever to 101.2, otherwise persistently tachycardic with return of BP to 123/74. WBC up trending over the past few days to 17K and unexplained at this point, but initially thought to be stress response secondary to facial trauma. Do not suspect dehydration given that he has been on 125 ml/hr of maintenance fluids. Fever, tachycardia and hypotension are c/f infection, MI, PE. Infection most likely given elevated WBC, fever. Likely respiratory given increased work of breathing and tachypnea, could be aspiration given his somnolence and Ams.Will obtain CXR and blood cultures and plan to start antibiotics after culture drawn. Recent UA 11/20 negative. Will get EKG to evaluate for other tachy arhythmia and for evidence of ischemia but given fever and WBC elevation do not suspect cardiac cause. Patient is on Lovenox and given elevated WBC and fever also suspect infectious process over PE.

## 2022-08-10 NOTE — Progress Notes (Signed)
Pharmacy Antibiotic Note  Charles Fox is a 71 y.o. male admitted on 08/08/2022 with aspiration PNA.  Pharmacy has been consulted for Unasyn dosing.  Plan: Unasyn 3g IV q 6 hrs Follow cultures, renal function and clinical course.  Height: 6' (182.9 cm) Weight: 102.8 kg (226 lb 10.1 oz) IBW/kg (Calculated) : 77.6  Temp (24hrs), Avg:99.2 F (37.3 C), Min:98.2 F (36.8 C), Max:101.2 F (38.4 C)  Recent Labs  Lab 08/08/22 0800 08/08/22 0808 08/08/22 1555 08/09/22 0408 08/10/22 0721  WBC 9.2  --   --  15.8* 17.7*  CREATININE 0.88 0.80 0.88  --  1.41*  LATICACIDVEN  --   --  2.1* 1.4  --     Estimated Creatinine Clearance: 61.3 mL/min (A) (by C-G formula based on SCr of 1.41 mg/dL (H)).    No Known Allergies   Thank you for allowing pharmacy to be a part of this patient's care.  Reece Leader, Colon Flattery, BCCP Clinical Pharmacist  08/10/2022 10:25 PM   [facility] pharmacy phone numbers are listed on [email or web address]

## 2022-08-10 NOTE — Progress Notes (Signed)
   08/10/22 2006  Assess: MEWS Score  Temp 99.5 F (37.5 C)  Pulse Rate (!) 144  Resp (!) 36  Level of Consciousness Responds to Voice  SpO2 97 %  O2 Device Room Air  Assess: MEWS Score  MEWS Temp 0  MEWS Systolic 0  MEWS Pulse 3  MEWS RR 3  MEWS LOC 1  MEWS Score 7  MEWS Score Color Red  Assess: if the MEWS score is Yellow or Red  Were vital signs taken at a resting state? Yes  Focused Assessment No change from prior assessment  Does the patient meet 2 or more of the SIRS criteria? Yes  Does the patient have a confirmed or suspected source of infection? No  MEWS guidelines implemented *See Row Information* No, previously yellow, continue vital signs every 4 hours  Treat  MEWS Interventions Escalated (See documentation below)  Pain Scale PAINAD  Breathing 0  Negative Vocalization 0  Facial Expression 0  Body Language 0  Consolability 0  PAINAD Score 0  Take Vital Signs  Increase Vital Sign Frequency  Red: Q 1hr X 4 then Q 4hr X 4, if remains red, continue Q 4hrs  Escalate  MEWS: Escalate Red: discuss with charge nurse/RN and provider, consider discussing with RRT  Notify: Charge Nurse/RN  Date Charge Nurse/RN Notified 08/10/22  Time Charge Nurse/RN Notified 2044  Provider Notification  Provider Name/Title Dr. Aundria Rud  Date Provider Notified 08/10/22  Time Provider Notified 2059  Method of Notification Call  Notification Reason Other (Comment) (Increased HR. Change in MEWs. Pt is unable to take po.)  Provider response At bedside (Give tylenol suppository early. EKG done, Lactic acid and blood culture obtained, CXR completed. Pt given ampicillin 3g IIVPB.)  Date of Provider Response 08/10/22  Time of Provider Response 2112  Notify: Rapid Response  Name of Rapid Response RN Notified Onalee Hua, RN  Date Rapid Response Notified 08/10/22  Time Rapid Response Notified 2100  Document  Patient Outcome Stabilized after interventions  Progress note created (see row info) Yes   Assess: SIRS CRITERIA  SIRS Temperature  0  SIRS Pulse 1  SIRS Respirations  1  SIRS WBC 1  SIRS Score Sum  3

## 2022-08-10 NOTE — Care Management Important Message (Signed)
Important Message  Patient Details  Name: Charles Fox MRN: [id number] Date of Birth: 15-Oct-1950   Medicare Important Message Given:  Yes     Dorena Bodo 08/10/2022, 3:25 PM

## 2022-08-11 ENCOUNTER — Inpatient Hospital Stay (HOSPITAL_COMMUNITY): Payer: Medicare Other

## 2022-08-11 DIAGNOSIS — R4182 Altered mental status, unspecified: Secondary | ICD-10-CM

## 2022-08-11 DIAGNOSIS — R579 Shock, unspecified: Secondary | ICD-10-CM

## 2022-08-11 LAB — POCT I-STAT 7, (LYTES, BLD GAS, ICA,H+H)
Acid-base deficit: 6 mmol/L — ABNORMAL HIGH (ref 0.0–2.0)
Bicarbonate: 16.8 mmol/L — ABNORMAL LOW (ref 20.0–28.0)
Calcium, Ion: 1.07 mmol/L — ABNORMAL LOW (ref 1.15–1.40)
HCT: 29 % — ABNORMAL LOW (ref 39.0–52.0)
Hemoglobin: 9.9 g/dL — ABNORMAL LOW (ref 13.0–17.0)
O2 Saturation: 100 %
Patient temperature: 103.7
Potassium: 3.5 mmol/L (ref 3.5–5.1)
Sodium: 142 mmol/L (ref 135–145)
TCO2: 18 mmol/L — ABNORMAL LOW (ref 22–32)
pCO2 arterial: 28.7 mmHg — ABNORMAL LOW (ref 32–48)
pH, Arterial: 7.387 (ref 7.35–7.45)
pO2, Arterial: 311 mmHg — ABNORMAL HIGH (ref 83–108)

## 2022-08-11 LAB — MRSA NEXT GEN BY PCR, NASAL: MRSA by PCR Next Gen: NOT DETECTED

## 2022-08-11 LAB — BASIC METABOLIC PANEL
Anion gap: 15 (ref 5–15)
Anion gap: 17 — ABNORMAL HIGH (ref 5–15)
BUN: 38 mg/dL — ABNORMAL HIGH (ref 8–23)
BUN: 41 mg/dL — ABNORMAL HIGH (ref 8–23)
CO2: 16 mmol/L — ABNORMAL LOW (ref 22–32)
CO2: 16 mmol/L — ABNORMAL LOW (ref 22–32)
Calcium: 8.4 mg/dL — ABNORMAL LOW (ref 8.9–10.3)
Calcium: 7.9 mg/dL — ABNORMAL LOW (ref 8.9–10.3)
Chloride: 108 mmol/L (ref 98–111)
Chloride: 110 mmol/L (ref 98–111)
Creatinine, Ser: 2.77 mg/dL — ABNORMAL HIGH (ref 0.61–1.24)
Creatinine, Ser: 3.37 mg/dL — ABNORMAL HIGH (ref 0.61–1.24)
GFR, Estimated: 24 mL/min — ABNORMAL LOW (ref >60)
GFR, Estimated: 19 mL/min — ABNORMAL LOW (ref >60)
Glucose, Bld: 204 mg/dL — ABNORMAL HIGH (ref 70–99)
Glucose, Bld: 207 mg/dL — ABNORMAL HIGH (ref 70–99)
Potassium: 3 mmol/L — ABNORMAL LOW (ref 3.5–5.1)
Potassium: 3.6 mmol/L (ref 3.5–5.1)
Sodium: 139 mmol/L (ref 135–145)
Sodium: 143 mmol/L (ref 135–145)

## 2022-08-11 LAB — URINALYSIS, ROUTINE W REFLEX MICROSCOPIC
Bilirubin Urine: NEGATIVE
Glucose, UA: 50 mg/dL — AB
Ketones, ur: NEGATIVE mg/dL
Nitrite: NEGATIVE
Protein, ur: 100 mg/dL — AB
Specific Gravity, Urine: 1.02 (ref 1.005–1.030)
pH: 5 (ref 5.0–8.0)

## 2022-08-11 LAB — GLUCOSE, CAPILLARY
Glucose-Capillary: 259 mg/dL — ABNORMAL HIGH (ref 70–99)
Glucose-Capillary: 264 mg/dL — ABNORMAL HIGH (ref 70–99)
Glucose-Capillary: 229 mg/dL — ABNORMAL HIGH (ref 70–99)
Glucose-Capillary: 177 mg/dL — ABNORMAL HIGH (ref 70–99)

## 2022-08-11 LAB — CBC
HCT: 34.3 % — ABNORMAL LOW (ref 39.0–52.0)
HCT: 29.8 % — ABNORMAL LOW (ref 39.0–52.0)
Hemoglobin: 11.2 g/dL — ABNORMAL LOW (ref 13.0–17.0)
Hemoglobin: 10.1 g/dL — ABNORMAL LOW (ref 13.0–17.0)
MCH: 29.4 pg (ref 26.0–34.0)
MCH: 30.7 pg (ref 26.0–34.0)
MCHC: 32.7 g/dL (ref 30.0–36.0)
MCHC: 33.9 g/dL (ref 30.0–36.0)
MCV: 90 fL (ref 80.0–100.0)
MCV: 90.6 fL (ref 80.0–100.0)
Platelets: 197 10*3/uL (ref 150–400)
Platelets: 179 10*3/uL (ref 150–400)
RBC: 3.81 MIL/uL — ABNORMAL LOW (ref 4.22–5.81)
RBC: 3.29 MIL/uL — ABNORMAL LOW (ref 4.22–5.81)
RDW: 14.5 % (ref 11.5–15.5)
RDW: 14.6 % (ref 11.5–15.5)
WBC: 12 10*3/uL — ABNORMAL HIGH (ref 4.0–10.5)
WBC: 13.7 10*3/uL — ABNORMAL HIGH (ref 4.0–10.5)
nRBC: 0.2 % (ref 0.0–0.2)
nRBC: 0.2 % (ref 0.0–0.2)

## 2022-08-11 LAB — HEPATIC FUNCTION PANEL
ALT: 19 U/L (ref 0–44)
AST: 45 U/L — ABNORMAL HIGH (ref 15–41)
Albumin: 3 g/dL — ABNORMAL LOW (ref 3.5–5.0)
Alkaline Phosphatase: 93 U/L (ref 38–126)
Bilirubin, Direct: 0.3 mg/dL — ABNORMAL HIGH (ref 0.0–0.2)
Indirect Bilirubin: 0.5 mg/dL (ref 0.3–0.9)
Total Bilirubin: 0.8 mg/dL (ref 0.3–1.2)
Total Protein: 6.1 g/dL — ABNORMAL LOW (ref 6.5–8.1)

## 2022-08-11 LAB — BLOOD CULTURE ID PANEL (REFLEXED) - BCID2

## 2022-08-11 LAB — LACTIC ACID, PLASMA
Lactic Acid, Venous: 4.5 mmol/L (ref 0.5–1.9)
Lactic Acid, Venous: 7.8 mmol/L (ref 0.5–1.9)
Lactic Acid, Venous: 8.2 mmol/L (ref 0.5–1.9)
Lactic Acid, Venous: 7.7 mmol/L (ref 0.5–1.9)
Lactic Acid, Venous: 6.4 mmol/L (ref 0.5–1.9)

## 2022-08-11 LAB — MAGNESIUM: Magnesium: 1.3 mg/dL — ABNORMAL LOW (ref 1.7–2.4)

## 2022-08-11 LAB — PROCALCITONIN: Procalcitonin: 127.9 ng/mL

## 2022-08-11 LAB — PHOSPHORUS: Phosphorus: 3.2 mg/dL (ref 2.5–4.6)

## 2022-08-11 MED ORDER — HEPARIN SODIUM (PORCINE) 5000 UNIT/ML IJ SOLN
5000.0000 [IU] | Freq: Three times a day (TID) | INTRAMUSCULAR | Status: DC
  Administered 2022-08-11 – 2022-08-29 (×56): 5000 [IU] via SUBCUTANEOUS
  Filled 2022-08-11 (×57): qty 1

## 2022-08-11 MED ORDER — INSULIN ASPART 100 UNIT/ML IJ SOLN
0.0000 [IU] | INTRAMUSCULAR | Status: DC
  Administered 2022-08-11: 2 [IU] via SUBCUTANEOUS
  Administered 2022-08-11: 3 [IU] via SUBCUTANEOUS
  Administered 2022-08-11 (×2): 5 [IU] via SUBCUTANEOUS
  Administered 2022-08-12: 1 [IU] via SUBCUTANEOUS
  Administered 2022-08-13: 2 [IU] via SUBCUTANEOUS
  Administered 2022-08-13 (×2): 1 [IU] via SUBCUTANEOUS
  Administered 2022-08-13 – 2022-08-14 (×4): 2 [IU] via SUBCUTANEOUS

## 2022-08-11 MED ORDER — LACTATED RINGERS IV BOLUS
1000.0000 mL | Freq: Once | INTRAVENOUS | Status: AC
  Administered 2022-08-11: 1000 mL via INTRAVENOUS

## 2022-08-11 MED ORDER — VASOPRESSIN 20 UNITS/100 ML INFUSION FOR SHOCK
INTRAVENOUS | Status: AC
  Administered 2022-08-11: 0.04 [IU]/min via INTRAVENOUS
  Filled 2022-08-11: qty 100

## 2022-08-11 MED ORDER — SODIUM CHLORIDE 0.9 % IV SOLN: INTRAVENOUS | Status: DC | PRN

## 2022-08-11 MED ORDER — VANCOMYCIN HCL 2000 MG/400ML IV SOLN
2000.0000 mg | Freq: Once | INTRAVENOUS | Status: AC
  Administered 2022-08-11: 2000 mg via INTRAVENOUS
  Filled 2022-08-11: qty 400

## 2022-08-11 MED ORDER — DOCUSATE SODIUM 50 MG/5ML PO LIQD
100.0000 mg | Freq: Two times a day (BID) | ORAL | Status: DC
  Administered 2022-08-11 – 2022-08-24 (×19): 100 mg
  Filled 2022-08-11 (×21): qty 10

## 2022-08-11 MED ORDER — DOPAMINE-DEXTROSE 3.2-5 MG/ML-% IV SOLN
0.0000 ug/kg/min | INTRAVENOUS | Status: DC
  Administered 2022-08-11: 20 ug/kg/min via INTRAVENOUS

## 2022-08-11 MED ORDER — ORAL CARE MOUTH RINSE: 15.0000 mL | OROMUCOSAL | Status: DC | PRN

## 2022-08-11 MED ORDER — NOREPINEPHRINE 4 MG/250ML-% IV SOLN
0.0000 ug/min | INTRAVENOUS | Status: DC
  Administered 2022-08-11: 40 ug/min via INTRAVENOUS
  Administered 2022-08-11: 50 ug/min via INTRAVENOUS
  Filled 2022-08-11 (×2): qty 250

## 2022-08-11 MED ORDER — FENTANYL 2500MCG IN NS 250ML (10MCG/ML) PREMIX INFUSION
INTRAVENOUS | Status: AC
  Administered 2022-08-11: 25 ug/h via INTRAVENOUS
  Filled 2022-08-11: qty 250

## 2022-08-11 MED ORDER — POLYETHYLENE GLYCOL 3350 17 G PO PACK
17.0000 g | PACK | Freq: Every day | ORAL | Status: DC
  Administered 2022-08-11 – 2022-08-22 (×9): 17 g
  Filled 2022-08-11 (×12): qty 1

## 2022-08-11 MED ORDER — FAMOTIDINE 20 MG PO TABS
20.0000 mg | ORAL_TABLET | Freq: Every day | ORAL | Status: DC
  Administered 2022-08-12 – 2022-08-14 (×3): 20 mg
  Filled 2022-08-11 (×3): qty 1

## 2022-08-11 MED ORDER — SODIUM BICARBONATE 8.4 % IV SOLN: 100.0000 meq | Freq: Once | INTRAVENOUS | Status: AC

## 2022-08-11 MED ORDER — SODIUM CHLORIDE 0.9 % IV SOLN
2.0000 g | Freq: Every day | INTRAVENOUS | Status: DC
  Administered 2022-08-11 – 2022-08-14 (×4): 2 g via INTRAVENOUS
  Filled 2022-08-11 (×4): qty 20

## 2022-08-11 MED ORDER — ROCURONIUM BROMIDE 10 MG/ML (PF) SYRINGE
PREFILLED_SYRINGE | INTRAVENOUS | Status: AC
  Administered 2022-08-11: 100 mg
  Filled 2022-08-11: qty 10

## 2022-08-11 MED ORDER — DEXMEDETOMIDINE HCL IN NACL 400 MCG/100ML IV SOLN
INTRAVENOUS | Status: AC
  Administered 2022-08-11: 0.4 ug/kg/h via INTRAVENOUS
  Filled 2022-08-11: qty 100

## 2022-08-11 MED ORDER — FENTANYL CITRATE PF 50 MCG/ML IJ SOSY: 25.0000 ug | PREFILLED_SYRINGE | INTRAMUSCULAR | Status: DC | PRN

## 2022-08-11 MED ORDER — SODIUM BICARBONATE 8.4 % IV SOLN
INTRAVENOUS | Status: AC
  Administered 2022-08-11: 100 meq via INTRAVENOUS
  Filled 2022-08-11: qty 100

## 2022-08-11 MED ORDER — LACTATED RINGERS IV SOLN: INTRAVENOUS | Status: DC

## 2022-08-11 MED ORDER — DOPAMINE-DEXTROSE 3.2-5 MG/ML-% IV SOLN
INTRAVENOUS | Status: AC
  Filled 2022-08-11: qty 250

## 2022-08-11 MED ORDER — NOREPINEPHRINE 16 MG/250ML-% IV SOLN
0.0000 ug/min | INTRAVENOUS | Status: DC
  Administered 2022-08-11: 25 ug/min via INTRAVENOUS
  Administered 2022-08-11 (×2): 15 ug/min via INTRAVENOUS
  Administered 2022-08-12: 5 ug/min via INTRAVENOUS
  Administered 2022-08-12: 10 ug/min via INTRAVENOUS
  Administered 2022-08-12: 8 ug/min via INTRAVENOUS
  Administered 2022-08-13: 9 ug/min via INTRAVENOUS
  Administered 2022-08-13: 17 ug/min via INTRAVENOUS
  Administered 2022-08-14: 5 ug/min via INTRAVENOUS
  Administered 2022-08-14: 9 ug/min via INTRAVENOUS
  Administered 2022-08-16: 13 ug/min via INTRAVENOUS
  Filled 2022-08-11 (×9): qty 250

## 2022-08-11 MED ORDER — ETOMIDATE 2 MG/ML IV SOLN
INTRAVENOUS | Status: AC
  Administered 2022-08-11: 20 mg
  Filled 2022-08-11: qty 10

## 2022-08-11 MED ORDER — VANCOMYCIN VARIABLE DOSE PER UNSTABLE RENAL FUNCTION (PHARMACIST DOSING): Status: DC

## 2022-08-11 MED ORDER — FENTANYL 2500MCG IN NS 250ML (10MCG/ML) PREMIX INFUSION
0.0000 ug/h | INTRAVENOUS | Status: DC
  Administered 2022-08-13 – 2022-08-16 (×4): 50 ug/h via INTRAVENOUS
  Filled 2022-08-11 (×3): qty 250

## 2022-08-11 MED ORDER — FAMOTIDINE 20 MG PO TABS: 20.0000 mg | ORAL_TABLET | Freq: Two times a day (BID) | ORAL | Status: DC

## 2022-08-11 MED ORDER — VASOPRESSIN 20 UNITS/100 ML INFUSION FOR SHOCK
0.0400 [IU]/min | INTRAVENOUS | Status: DC
  Administered 2022-08-11: 0.04 [IU]/min via INTRAVENOUS
  Filled 2022-08-11: qty 100

## 2022-08-11 MED ORDER — FAMOTIDINE 20 MG PO TABS
20.0000 mg | ORAL_TABLET | Freq: Two times a day (BID) | ORAL | Status: DC
  Administered 2022-08-11: 20 mg
  Filled 2022-08-11: qty 1

## 2022-08-11 MED ORDER — ORAL CARE MOUTH RINSE
15.0000 mL | OROMUCOSAL | Status: DC
  Administered 2022-08-11 – 2022-08-18 (×83): 15 mL via OROMUCOSAL

## 2022-08-11 MED ORDER — CHLORHEXIDINE GLUCONATE CLOTH 2 % EX PADS
6.0000 | MEDICATED_PAD | Freq: Every day | CUTANEOUS | Status: DC
  Administered 2022-08-11 – 2022-08-29 (×21): 6 via TOPICAL

## 2022-08-11 MED ORDER — LACTATED RINGERS IV BOLUS
2000.0000 mL | Freq: Once | INTRAVENOUS | Status: AC
  Administered 2022-08-11: 2000 mL via INTRAVENOUS

## 2022-08-11 MED ORDER — NOREPINEPHRINE 4 MG/250ML-% IV SOLN
INTRAVENOUS | Status: AC
  Administered 2022-08-11: 5 ug/min via INTRAVENOUS
  Filled 2022-08-11: qty 250

## 2022-08-11 MED ORDER — DEXMEDETOMIDINE HCL IN NACL 400 MCG/100ML IV SOLN
0.0000 ug/kg/h | INTRAVENOUS | Status: DC
  Administered 2022-08-11 – 2022-08-12 (×3): 0.4 ug/kg/h via INTRAVENOUS
  Administered 2022-08-12: 0.8 ug/kg/h via INTRAVENOUS
  Administered 2022-08-13: 0.5 ug/kg/h via INTRAVENOUS
  Administered 2022-08-13: 0.6 ug/kg/h via INTRAVENOUS
  Administered 2022-08-14 (×2): 0.5 ug/kg/h via INTRAVENOUS
  Administered 2022-08-14: 0.4 ug/kg/h via INTRAVENOUS
  Administered 2022-08-15: 0.6 ug/kg/h via INTRAVENOUS
  Administered 2022-08-15: 0.8 ug/kg/h via INTRAVENOUS
  Administered 2022-08-15: 0.4 ug/kg/h via INTRAVENOUS
  Administered 2022-08-16: 0.2 ug/kg/h via INTRAVENOUS
  Administered 2022-08-16: 0.4 ug/kg/h via INTRAVENOUS
  Filled 2022-08-11 (×15): qty 100

## 2022-08-11 MED ORDER — CALCIUM GLUCONATE-NACL 2-0.675 GM/100ML-% IV SOLN
2.0000 g | Freq: Once | INTRAVENOUS | Status: AC
  Administered 2022-08-11: 2000 mg via INTRAVENOUS
  Filled 2022-08-11: qty 100

## 2022-08-11 MED ORDER — POTASSIUM CHLORIDE 10 MEQ/100ML IV SOLN
10.0000 meq | INTRAVENOUS | Status: AC
  Administered 2022-08-11 (×4): 10 meq via INTRAVENOUS
  Filled 2022-08-11 (×4): qty 100

## 2022-08-11 MED ORDER — LACTATED RINGERS IV BOLUS
500.0000 mL | Freq: Once | INTRAVENOUS | Status: AC
  Administered 2022-08-11: 500 mL via INTRAVENOUS

## 2022-08-11 NOTE — Progress Notes (Signed)
Dr. Aundria Rud was made aware that BP=58/ RR=34. Pt is unresponsive. Dr. Aundria Rud came to evaluate pt.

## 2022-08-11 NOTE — Plan of Care (Signed)
Patient received on PRVC ventilation, settings as charted. No changes made at this time. Small oral lacerations noted on patients lips and mouth, RN aware.

## 2022-08-11 NOTE — Progress Notes (Addendum)
PHARMACY - PHYSICIAN COMMUNICATION CRITICAL VALUE ALERT - BLOOD CULTURE IDENTIFICATION (BCID)  Charles Fox is an 71 y.o. male who presented to [facility] on 08/08/2022 with a chief complaint of acute AMS with possible fall and seizures. Transferred to ICU 11/23 duet o worsening mental status, empirically started on Vancomycin and Unasyn for sepsis from possible pneumonia.  Assessment:   11/22 blood culture (4/4): GNRs - preliminary BCID identified e.c oli with no resistance genes  11/23 MRSA nares: negative  Name of physician (or Provider) Contacted: Dr. Isaiah Serge  Current antibiotics: Vancomycin/Unasyn  Changes to prescribed antibiotics recommended:  Per conversation with MD, narrow from Vancomycin/Unasyn to [facility] for both e. Coli bacteremia coverage and pneumonia coverage Recommendations accepted by provider  Results for orders placed or performed during the hospital encounter of 08/08/22  Blood Culture ID Panel (Reflexed) (Collected: 08/10/2022  9:43 PM)  Result Value Ref Range   Enterococcus faecalis NOT DETECTED NOT DETECTED   Enterococcus Faecium NOT DETECTED NOT DETECTED   Listeria monocytogenes NOT DETECTED NOT DETECTED   Staphylococcus species NOT DETECTED NOT DETECTED   Staphylococcus aureus (BCID) NOT DETECTED NOT DETECTED   Staphylococcus epidermidis NOT DETECTED NOT DETECTED   Staphylococcus lugdunensis NOT DETECTED NOT DETECTED   Streptococcus species NOT DETECTED NOT DETECTED   Streptococcus agalactiae NOT DETECTED NOT DETECTED   Streptococcus pneumoniae NOT DETECTED NOT DETECTED   Streptococcus pyogenes NOT DETECTED NOT DETECTED   A.calcoaceticus-baumannii NOT DETECTED NOT DETECTED   Bacteroides fragilis NOT DETECTED NOT DETECTED   Enterobacterales DETECTED (A) NOT DETECTED   Enterobacter cloacae complex NOT DETECTED NOT DETECTED   Escherichia coli DETECTED (A) NOT DETECTED   Klebsiella aerogenes NOT DETECTED NOT DETECTED   Klebsiella oxytoca NOT  DETECTED NOT DETECTED   Klebsiella pneumoniae NOT DETECTED NOT DETECTED   Proteus species NOT DETECTED NOT DETECTED   Salmonella species NOT DETECTED NOT DETECTED   Serratia marcescens NOT DETECTED NOT DETECTED   Haemophilus influenzae NOT DETECTED NOT DETECTED   Neisseria meningitidis NOT DETECTED NOT DETECTED   Pseudomonas aeruginosa NOT DETECTED NOT DETECTED   Stenotrophomonas maltophilia NOT DETECTED NOT DETECTED   Candida albicans NOT DETECTED NOT DETECTED   Candida auris NOT DETECTED NOT DETECTED   Candida glabrata NOT DETECTED NOT DETECTED   Candida krusei NOT DETECTED NOT DETECTED   Candida parapsilosis NOT DETECTED NOT DETECTED   Candida tropicalis NOT DETECTED NOT DETECTED   Cryptococcus neoformans/gattii NOT DETECTED NOT DETECTED   CTX-M ESBL NOT DETECTED NOT DETECTED   Carbapenem resistance IMP NOT DETECTED NOT DETECTED   Carbapenem resistance KPC NOT DETECTED NOT DETECTED   Carbapenem resistance NDM NOT DETECTED NOT DETECTED   Carbapenem resist OXA 48 LIKE NOT DETECTED NOT DETECTED   Carbapenem resistance VIM NOT DETECTED NOT DETECTED   Thank you for allowing pharmacy to be a part of this patient's care.  Thelma Barge, PharmD Clinical Pharmacist

## 2022-08-11 NOTE — Progress Notes (Signed)
Blood pressures labile pressors on and off see MAR. Bolus given through out the shift for elevated Lactic levels. Dr Isaiah Serge aware will continue to monitor.

## 2022-08-11 NOTE — Progress Notes (Signed)
Pt transferred to 4N ICU via bed. Report given to Joey.

## 2022-08-11 NOTE — Procedures (Addendum)
Central Venous Catheter Insertion Procedure Note  LYELL CLUGSTON  [id number]  05-31-1951  Date:08/11/22  Time:4:56 AM   Provider Performing:Laura Gleason  Procedure: Insertion of Non-tunneled Central Venous Catheter(36556) with US guidance (57322)   Indication(s) Medication administration  Consent Unable to obtain consent due to emergent nature of procedure.  Anesthesia Topical only with 1% lidocaine   Timeout Verified patient identification, verified procedure, site/side was marked, verified correct patient position, special equipment/implants available, medications/allergies/relevant history reviewed, required imaging and test results available.  Sterile Technique Maximal sterile technique including full sterile barrier drape, hand hygiene, sterile gown, sterile gloves, mask, hair covering, sterile ultrasound probe cover (if used).  Procedure Description Area of catheter insertion was cleaned with chlorhexidine and draped in sterile fashion.  With real-time ultrasound guidance a central venous catheter was placed into the right internal jugular vein. Nonpulsatile blood flow and easy flushing noted in all ports.  The catheter was sutured in place and sterile dressing applied.  Complications/Tolerance None; patient tolerated the procedure well. Chest X-ray is ordered to verify placement for internal jugular or subclavian cannulation.   Chest x-ray is not ordered for femoral cannulation.  EBL Minimal  Specimen(s) None

## 2022-08-11 NOTE — Procedures (Signed)
Arterial Catheter Insertion Procedure Note  TESHAWN MOAN  [id number]  1950-12-26  Date:08/11/22  Time:5:43 AM    Provider Performing: Morley Kos    Procedure: Insertion of Arterial Line (09326) without US guidance  Indication(s) Blood pressure monitoring and/or need for frequent ABGs  Consent Unable to obtain consent due to emergent nature of procedure.  Anesthesia None   Time Out Verified patient identification, verified procedure, site/side was marked, verified correct patient position, special equipment/implants available, medications/allergies/relevant history reviewed, required imaging and test results available.   Sterile Technique Maximal sterile technique including full sterile barrier drape, hand hygiene, sterile gown, sterile gloves, mask, hair covering, sterile ultrasound probe cover (if used).   Procedure Description Area of catheter insertion was cleaned with chlorhexidine and draped in sterile fashion. With real-time ultrasound guidance an arterial catheter was placed into the right radial artery.  Appropriate arterial tracings confirmed on monitor.     Complications/Tolerance None; patient tolerated the procedure well.   EBL Minimal   Specimen(s) None

## 2022-08-11 NOTE — Procedures (Signed)
Intubation Procedure Note  HUSAIN COSTABILE  [id number]  Aug 08, 1951  Date:08/11/22  Time:4:58 AM   Provider Performing:Laura Gleason   Procedure: Intubation (31500)  Indication(s) Respiratory Failure  Consent Unable to obtain consent due to emergent nature of procedure.   Anesthesia Etomidate and Rocuronium   Time Out Verified patient identification, verified procedure, site/side was marked, verified correct patient position, special equipment/implants available, medications/allergies/relevant history reviewed, required imaging and test results available.   Sterile Technique Usual hand hygeine, masks, and gloves were used   Procedure Description Patient positioned in bed supine.  Sedation given as noted above.  Patient was intubated with endotracheal tube using Glidescope.  View was Grade 1 full glottis .  Number of attempts was 1.  Colorimetric CO2 detector was consistent with tracheal placement.   Complications/Tolerance None; patient tolerated the procedure well. Chest X-ray is ordered to verify placement.   EBL Minimal   Specimen(s) None

## 2022-08-11 NOTE — Progress Notes (Signed)
Pharmacy Antibiotic Note  Charles Fox is a 71 y.o. male admitted on 08/08/2022, now s/p intubation with concern for sepsis.  Pharmacy has been consulted for vancomycin dosing.  Pt w/ AKI; baseline SCr <1, now 2.77.  Plan: Vancomycin 2000mg  IV x1; monitor SCr +/- vanc level prior to redosing.  Height: 6' (182.9 cm) Weight: 102.8 kg (226 lb 10.1 oz) IBW/kg (Calculated) : 77.6  Temp (24hrs), Avg:100 F (37.8 C), Min:98.2 F (36.8 C), Max:103.7 F (39.8 C)  Recent Labs  Lab 08/08/22 0800 08/08/22 0808 08/08/22 1555 08/09/22 0408 08/10/22 0721 08/10/22 2153 08/11/22 0052  WBC 9.2  --   --  15.8* 17.7*  --  12.0*  CREATININE 0.88 0.80 0.88  --  1.41*  --  2.77*  LATICACIDVEN  --   --  2.1* 1.4  --  5.7* 4.5*    Estimated Creatinine Clearance: 31.2 mL/min (A) (by C-G formula based on SCr of 2.77 mg/dL (H)).    No Known Allergies  Thank you for allowing pharmacy to be a part of this patient's care.  08/13/22, PharmD, BCPS  08/11/2022 5:15 AM

## 2022-08-11 NOTE — Plan of Care (Signed)
No family at bedside.

## 2022-08-11 NOTE — Consult Note (Addendum)
NAME:  Charles Fox, MRN:  [id number], DOB:  March 11, 1951, LOS: 3 ADMISSION DATE:  08/08/2022, CONSULTATION DATE:  08/11/22 REFERRING MD:  Aundria Rud, CHIEF COMPLAINT:  confusion   History of Present Illness:  71 year old man hx of DM2, HTN, HLD, CVA, dementia [place] in [facility] [place] [facility] [place] [facility] presenting with AMS questionable fall and seizure.  Nasal fx on imaging.  Neuro workup neg (MRI/EEG), question raised of progressive dementia or hypertensive encephalopathy.  Worsening lethargy starting yesterday and progressive through day now more hypotensive, rising WBC, near obtundation, SOB prompting ICU eval.  Bladder scan with urinary retention CXR question evolving R infiltrate  Hx per chart review due to patient condition.  Pertinent  Medical History  Dementia, DM, HTN   Significant Hospital Events: Including procedures, antibiotic start and stop dates in addition to other pertinent events   08/08/22 admit 11/22 worsening mental status 11/23 ICU consult  Interim History / Subjective:  Consult  Objective   Blood pressure (!) 98/59, pulse (!) 117, temperature (!) 103.7 F (39.8 C), temperature source Rectal, resp. rate (!) 29, height 6' (1.829 m), weight 102.8 kg, SpO2 92 %.        Intake/Output Summary (Last 24 hours) at 08/11/2022 0439 Last data filed at 08/11/2022 0055 Gross per 24 hour  Intake --  Output 250 ml  Net -250 ml   Filed Weights   08/08/22 0800 08/09/22 0207  Weight: 103 kg 102.8 kg    Examination: General: ill appearing man tachypneic in trendelenberg HENT: MM dry, foul smelling breath, poor dentition Lungs: gurgling upper airway sounds, tachypneic Cardiovascular: tachycardic, sinus on monitor, ext warm Abdomen: soft, ?fullness around bladder, hypoactive BS Extremities: No edema Neuro: withdraws to pain, not answering questions, weak cough GU: foley to be placed  Echo fine Lactate up   Resolved Hospital Problem list   N/A  Assessment & Plan:   Acute hypoxemic respiratory failure Evolving septic shock question UTI vs. Aspiration vs. HCAP Acute renal failure Acute metabolic encephalopathy with neg extensive workup to date Urinary retention Baseline dementia [place] of state DM2 with hyperglycemia Hx HTN  - 2 more liters LR - Intubate, vent/PAD bundle - Levophed for MAP 65 - Vanc, unasyn, f/u culture data (blood/ urine/ sputum) - foley - DC all antihypertensives - Ward of state, do not think good long term HD candidate, if does not turn around in next day or two may need to visit GOC  Best Practice (right click and "Reselect all SmartList Selections" daily)   Diet/type: NPO DVT prophylaxis: prophylactic heparin  GI prophylaxis: H2B Lines: Central line Foley:  Yes, and it is still needed Code Status:  full code Last date of multidisciplinary goals of care discussion [pending]  Labs   CBC: Recent Labs  Lab 08/08/22 0800 08/08/22 0808 08/08/22 0857 08/08/22 1621 08/09/22 0408 08/10/22 0721 08/11/22 0052  WBC 9.2  --   --   --  15.8* 17.7* 12.0*  NEUTROABS 6.2  --   --   --   --   --   --   HGB 11.8*   < > 12.2* 12.9* 12.4* 12.7* 11.2*  HCT 36.2*   < > 36.0* 38.0* 35.6* 37.1* 34.3*  MCV 92.6  --   --   --  88.3 89.2 90.0  PLT 213  --   --   --  295 272 197   < > = values in this interval not displayed.    Basic Metabolic Panel: Recent  Labs  Lab 08/08/22 0800 08/08/22 0808 08/08/22 0857 08/08/22 1555 08/08/22 1621 08/10/22 0721 08/11/22 0052  NA 137 136 135 136 134* 138 139  K 4.5 4.1 4.2 4.2 4.1 4.0 3.0*  CL 99 101  --  101  --  101 108  CO2 19*  --   --  21*  --  23 16*  GLUCOSE 173* 171*  --  217*  --  211* 204*  BUN 14 15  --  11  --  23 38*  CREATININE 0.88 0.80  --  0.88  --  1.41* 2.77*  CALCIUM 9.0  --   --  8.9  --  9.0 8.4*   GFR: Estimated Creatinine Clearance: 31.2 mL/min (A) (by C-G formula based on SCr of 2.77 mg/dL (H)). Recent Labs  Lab 08/08/22 0800 08/08/22 1555  08/09/22 0408 08/10/22 0721 08/10/22 2153 08/11/22 0052  WBC 9.2  --  15.8* 17.7*  --  12.0*  LATICACIDVEN  --  2.1* 1.4  --  5.7* 4.5*    Liver Function Tests: Recent Labs  Lab 08/08/22 0800  AST 25  ALT 15  ALKPHOS 67  BILITOT 0.7  PROT 6.6  ALBUMIN 3.5   No results for input(s): "LIPASE", "AMYLASE" in the last 168 hours. Recent Labs  Lab 08/08/22 1555 08/10/22 1001  AMMONIA 49* 35    ABG    Component Value Date/Time   HCO3 22.8 08/08/2022 1621   TCO2 24 08/08/2022 1621   ACIDBASEDEF 1.0 08/08/2022 0857   O2SAT 93 08/08/2022 1621     Coagulation Profile: Recent Labs  Lab 08/08/22 0800  INR 1.1    Cardiac Enzymes: Recent Labs  Lab 08/08/22 1555  CKTOTAL 631*    HbA1C: Hgb A1c MFr Bld  Date/Time Value Ref Range Status  08/08/2022 03:55 PM 6.6 (H) 4.8 - 5.6 % Final    Comment:    (NOTE) Pre diabetes:          5.7%-6.4%  Diabetes:              >6.4%  Glycemic control for   <7.0% adults with diabetes     CBG: Recent Labs  Lab 08/09/22 2133 08/10/22 0638 08/10/22 1258 08/10/22 1627 08/10/22 2220  GLUCAP 226* 248* 199* 180* 185*    Review of Systems:   Obtunded  Past Medical History:  He,  has no past medical history on file.   Surgical History:  History reviewed. No pertinent surgical history.   Social History:      Family History:  His family history is not on file.   Allergies No Known Allergies   Home Medications  Prior to Admission medications   Medication Sig Start Date End Date Taking? Authorizing Provider  aspirin EC 81 MG tablet Take 81 mg by mouth daily. Swallow whole.   Yes [provider]  atorvastatin (LIPITOR) 40 MG tablet Take 40 mg by mouth daily.   Yes [provider]  carvedilol (COREG) 6.25 MG tablet Take 6.25 mg by mouth 2 (two) times daily with a meal.   Yes [provider]  citalopram (CELEXA) 40 MG tablet Take 40 mg by mouth daily.   Yes [provider]   finasteride (PROSCAR) 5 MG tablet Take 5 mg by mouth daily.   Yes [provider]  insulin glargine (LANTUS) 100 UNIT/ML Solostar Pen Inject 16 Units into the skin daily.   Yes [provider]  irbesartan (AVAPRO) 150 MG tablet Take 150 mg  by mouth daily.   Yes [provider]  melatonin 5 MG TABS Take 5 mg by mouth at bedtime.   Yes [provider]  metFORMIN (GLUCOPHAGE-XR) 500 MG 24 hr tablet Take 1,000 mg by mouth 2 (two) times daily.   Yes [provider]  nitroGLYCERIN (NITROSTAT) 0.4 MG SL tablet Place 0.4 mg under the tongue every 5 (five) minutes as needed for chest pain.   Yes [provider]  olopatadine (PATANOL) 0.1 % ophthalmic solution Place 1 drop into both eyes 2 (two) times daily as needed for allergies (dry eyes).   Yes [provider]  polyethylene glycol (MIRALAX / GLYCOLAX) 17 g packet Take 17 g by mouth daily as needed for mild constipation.   Yes [provider]  QUEtiapine (SEROQUEL) 25 MG tablet Take 75 mg by mouth at bedtime.   Yes [provider]  senna (SENOKOT) 8.6 MG tablet Take 1 tablet by mouth daily.   Yes [provider]  tamsulosin (FLOMAX) 0.4 MG CAPS capsule Take 0.8 mg by mouth daily after breakfast.   Yes [provider]     Critical care time: 34 min independent of procedures

## 2022-08-11 NOTE — Progress Notes (Signed)
eLink Physician-Brief Progress Note Patient Name: Charles Fox DOB: 11-28-1950 MRN: [id number]   Date of Service  08/11/2022  HPI/Events of Note  Multiple issues: 1. ABG on 100%/PRVC 24/TV 550/P 5 = 7.38/28.7/311/16.8. 2. Ionized Ca++ = 1.07.  eICU Interventions  Plan: Wean FiO2 as tolerated. Otherwise continue present ventilator management. Will replace Ca++.     Intervention Category Major Interventions: Electrolyte abnormality - evaluation and management;Respiratory failure - evaluation and management  Shaterrica Territo Dennard Nip 08/11/2022, 6:03 AM

## 2022-08-11 NOTE — Progress Notes (Signed)
NAME:  Charles Fox, MRN:  [id number], DOB:  23-Jun-1951, LOS: 3 ADMISSION DATE:  08/08/2022, CONSULTATION DATE:  08/11/22 REFERRING MD:  Aundria Rud, CHIEF COMPLAINT:  confusion   History of Present Illness:  71 year old man hx of DM2, HTN, HLD, CVA, dementia resides in SNF ward of the state presenting with AMS questionable fall and seizure.  Nasal fx on imaging.  Neuro workup neg (MRI/EEG), question raised of progressive dementia or hypertensive encephalopathy.  Worsening lethargy starting yesterday and progressive through day now more hypotensive, rising WBC, near obtundation, SOB prompting ICU eval.  Bladder scan with urinary retention CXR question evolving R infiltrate  Hx per chart review due to patient condition.  Pertinent  Medical History  Dementia, DM, HTN   Significant Hospital Events: Including procedures, antibiotic start and stop dates in addition to other pertinent events   08/08/22 admit 11/22 worsening mental status 11/23 ICU consult, intubated, started on pressors for profound shock with lactic acidosis.   Interim History / Subjective:   Remains hypotensive on max dose of Levophed and vasopressin.  Lactic acid is increased Remains on the ventilator, unresponsive.  Objective   Blood pressure 101/67, pulse 99, temperature (!) 103.7 F (39.8 C), temperature source Rectal, resp. rate (!) 24, height 6' (1.829 m), weight 102.8 kg, SpO2 100 %.    Vent Mode: PRVC FiO2 (%):  [40 %-100 %] 40 % Set Rate:  [24 bmp] 24 bmp Vt Set:  [550 mL] 550 mL PEEP:  [5 cmH20] 5 cmH20   Intake/Output Summary (Last 24 hours) at 08/11/2022 0839 Last data filed at 08/11/2022 0600 Gross per 24 hour  Intake --  Output 1600 ml  Net -1600 ml   Filed Weights   08/08/22 0800 08/09/22 0207  Weight: 103 kg 102.8 kg    Examination: Gen:      No acute distress, critically ill-appearing HEENT:  EOMI, sclera anicteric Neck:     No masses; no thyromegaly, ET tube Lungs:    Clear to  auscultation bilaterally; normal respiratory effort CV:         Regular rate and rhythm; no murmurs Abd:      + bowel sounds; soft, non-tender; no palpable masses, no distension Ext:    No edema; adequate peripheral perfusion Skin:      Warm and dry; no rash Neuro: Sedated, unresponsive.  Labs/imaging reviewed Significant for creatinine up to 3.37, lactic acid 7.8, WBC 13.7 Blood cultures show gram-negative rod bacteremia   Resolved Hospital Problem list   N/A  Assessment & Plan:  Acute hypoxemic respiratory failure secondary to pneumonia Continue full vent support for now Follow chest x-ray  Severe septic shock secondary to UTI versus aspiration, HCAP Gram-negative rod bacteremia Continue Unasyn.  Off vancomycin as MRSA swab was negative Will need additional fluid boluses Echo noted with adequate LVEF Hold hypertension meds.  Acute renal failure secondary to sepsis, ATN Urinary retention Renal ultrasound is pending Monitor urine output and creatinine.  Acute metabolic encephalopathy secondary to sepsis Baseline dementia Workup including MRI has been negative.  Diabetes Sliding scale insulin  GOC Ward of state Prognosis is guarded  Best Practice (right click and "Reselect all SmartList Selections" daily)   Diet/type: NPO DVT prophylaxis: prophylactic heparin  GI prophylaxis: H2B Lines: Central line Foley:  Yes, and it is still needed Code Status:  full code Last date of multidisciplinary goals of care discussion [pending]  Critical care time:   The patient is critically ill with multiple organ  system failure and requires high complexity decision making for assessment and support, frequent evaluation and titration of therapies, advanced monitoring, review of radiographic studies and interpretation of complex data.   Critical Care Time devoted to patient care services, exclusive of separately billable procedures, described in this note is 35 minutes.   Chilton Greathouse MD [facility] See Amion for pager  If no response to pager , please call [phone] until 7pm After 7:00 pm call Elink  [phone] 08/11/2022, 8:39 AM

## 2022-08-11 NOTE — Progress Notes (Signed)
SLP Cancellation Note  Patient Details Name: CHALLEN SPAINHOUR MRN: [id number] DOB: 20-Mar-1951   Cancelled treatment:       Reason Eval/Treat Not Completed: Medical issues which prohibited therapy. Pt now intubated - will hold for today.     Mahala Menghini., M.A. CCC-SLP Acute Rehabilitation Services Office [phone]  Secure chat preferred  08/11/2022, 1:37 PM

## 2022-08-11 NOTE — Significant Event (Signed)
Notified by RN that BP was 295-188 systolics at 4:16 AM.Patient was found to have lactate of 5.7 that down trended to 4.5.  Notified at 2:26 AM that patient had only voided 267m the entire shift despite being on 1259mhr maintenance fluids. Patient met sepsis criteria and was going to start sepsis fluid bolus at 0.3L/kg but given creatinine rise from 1.41 to 2.77 with urinary retention did not start fluids given concern for obstruction and given that MAPs were stable. Bladder scan order placed at 2:30. Notified at 3:27AM that bladder scan was significant for 98944murinary retention. Foley and 1L fluid bolus was ordered at that time. At 3:34 notified BP was 58/87. Immediately went to floor to evaluate patient who had MAPs in the 50s and was unresponsive to verbal stimuli or sternal rub. Placed in trendelenburg and called PCCM for ICU transfer. He was accepted for transfer and handed off directly to Dr. SmiTamala Julian

## 2022-08-12 ENCOUNTER — Inpatient Hospital Stay (HOSPITAL_COMMUNITY): Payer: Medicare Other

## 2022-08-12 DIAGNOSIS — N179 Acute kidney failure, unspecified: Secondary | ICD-10-CM

## 2022-08-12 LAB — CBC
HCT: 31.1 % — ABNORMAL LOW (ref 39.0–52.0)
Hemoglobin: 10.2 g/dL — ABNORMAL LOW (ref 13.0–17.0)
MCH: 29.9 pg (ref 26.0–34.0)
MCHC: 32.8 g/dL (ref 30.0–36.0)
MCV: 91.2 fL (ref 80.0–100.0)
Platelets: 155 10*3/uL (ref 150–400)
RBC: 3.41 MIL/uL — ABNORMAL LOW (ref 4.22–5.81)
RDW: 15.3 % (ref 11.5–15.5)
WBC: 27.7 10*3/uL — ABNORMAL HIGH (ref 4.0–10.5)
nRBC: 0 % (ref 0.0–0.2)

## 2022-08-12 LAB — COMPREHENSIVE METABOLIC PANEL
ALT: 81 U/L — ABNORMAL HIGH (ref 0–44)
AST: 69 U/L — ABNORMAL HIGH (ref 15–41)
Albumin: 2.3 g/dL — ABNORMAL LOW (ref 3.5–5.0)
Alkaline Phosphatase: 73 U/L (ref 38–126)
Anion gap: 15 (ref 5–15)
BUN: 44 mg/dL — ABNORMAL HIGH (ref 8–23)
CO2: 21 mmol/L — ABNORMAL LOW (ref 22–32)
Calcium: 8.3 mg/dL — ABNORMAL LOW (ref 8.9–10.3)
Chloride: 108 mmol/L (ref 98–111)
Creatinine, Ser: 2.41 mg/dL — ABNORMAL HIGH (ref 0.61–1.24)
GFR, Estimated: 28 mL/min — ABNORMAL LOW (ref >60)
Glucose, Bld: 144 mg/dL — ABNORMAL HIGH (ref 70–99)
Potassium: 4 mmol/L (ref 3.5–5.1)
Sodium: 144 mmol/L (ref 135–145)
Total Bilirubin: 0.8 mg/dL (ref 0.3–1.2)
Total Protein: 5.6 g/dL — ABNORMAL LOW (ref 6.5–8.1)

## 2022-08-12 LAB — PHOSPHORUS: Phosphorus: 4.1 mg/dL (ref 2.5–4.6)

## 2022-08-12 LAB — GLUCOSE, CAPILLARY
Glucose-Capillary: 139 mg/dL — ABNORMAL HIGH (ref 70–99)
Glucose-Capillary: 97 mg/dL (ref 70–99)
Glucose-Capillary: 92 mg/dL (ref 70–99)
Glucose-Capillary: 82 mg/dL (ref 70–99)
Glucose-Capillary: 93 mg/dL (ref 70–99)
Glucose-Capillary: 116 mg/dL — ABNORMAL HIGH (ref 70–99)

## 2022-08-12 LAB — MAGNESIUM: Magnesium: 1.5 mg/dL — ABNORMAL LOW (ref 1.7–2.4)

## 2022-08-12 LAB — LACTIC ACID, PLASMA: Lactic Acid, Venous: 2.6 mmol/L (ref 0.5–1.9)

## 2022-08-12 MED ORDER — FENTANYL BOLUS VIA INFUSION
25.0000 ug | INTRAVENOUS | Status: DC | PRN
  Administered 2022-08-16 – 2022-08-17 (×2): 50 ug via INTRAVENOUS
  Administered 2022-08-17: 75 ug via INTRAVENOUS
  Administered 2022-08-18: 50 ug via INTRAVENOUS

## 2022-08-12 MED ORDER — MAGNESIUM SULFATE 2 GM/50ML IV SOLN
2.0000 g | Freq: Once | INTRAVENOUS | Status: AC
  Administered 2022-08-12: 2 g via INTRAVENOUS
  Filled 2022-08-12: qty 50

## 2022-08-12 MED ORDER — IPRATROPIUM-ALBUTEROL 0.5-2.5 (3) MG/3ML IN SOLN
3.0000 mL | RESPIRATORY_TRACT | Status: DC | PRN
  Administered 2022-08-18: 3 mL via RESPIRATORY_TRACT
  Filled 2022-08-12: qty 3

## 2022-08-12 MED ORDER — ACETAMINOPHEN 650 MG RE SUPP: 650.0000 mg | Freq: Four times a day (QID) | RECTAL | Status: DC

## 2022-08-12 MED ORDER — ACETAMINOPHEN 160 MG/5ML PO SOLN
650.0000 mg | Freq: Four times a day (QID) | ORAL | Status: DC
  Administered 2022-08-12 – 2022-08-26 (×56): 650 mg
  Filled 2022-08-12 (×56): qty 20.3

## 2022-08-12 NOTE — Progress Notes (Signed)
eLink Physician-Brief Progress Note Patient Name: Charles Fox DOB: Mar 14, 1951 MRN: [id number]   Date of Service  08/12/2022  HPI/Events of Note  RN reports pt remains febrile, despite ice packs, blankets off, & prn tylenol given at 2134.  Temp 101.3 axillary  HR 120-30s Creatinine 3.37  Blood culture GNR on ceftriaxone ID panel positive for E coli and enterobacterales  eICU Interventions  Cooling blanket ordered Avoiding NSAIDs to due renal insufficiency Continue antibiotics Discussed with bedside RN     Intervention Category Intermediate Interventions: Other:  Darl Pikes 08/12/2022, 12:27 AM

## 2022-08-12 NOTE — Progress Notes (Signed)
SLP Cancellation Note  Patient Details Name: Charles Fox MRN: [id number] DOB: 03/28/1951   Cancelled treatment:       Reason Eval/Treat Not Completed: Medical issues which prohibited therapy - remains on vent. Will f/u as able.    Mahala Menghini., M.A. CCC-SLP Acute Rehabilitation Services Office [phone]  Secure chat preferred  08/12/2022, 7:35 AM

## 2022-08-12 NOTE — Progress Notes (Signed)
PT Cancellation Note  Patient Details Name: Charles Fox MRN: [id number] DOB: 1951-03-20   Cancelled Treatment:    Reason Eval/Treat Not Completed: Medical issues which prohibited therapy. Pt intubated and sedated. PT will check back on Monday. If the patient becomes more medically stable and is able to wean sedation prior to Monday please contact PT for earlier initiation of re-evaluation.   Arlyss Gandy 08/12/2022, 10:03 AM

## 2022-08-12 NOTE — Progress Notes (Addendum)
NAME:  Charles Fox, MRN:  [id number], DOB:  01-23-1951, LOS: 4 ADMISSION DATE:  08/08/2022, CONSULTATION DATE:  08/11/22 REFERRING MD:  Aundria Rud, CHIEF COMPLAINT:  confusion   History of Present Illness:  71 year old man hx of DM2, HTN, HLD, CVA, dementia [facility] presenting with AMS questionable fall and seizure.  Nasal fx on imaging.  Neuro workup neg (MRI/EEG), question raised of progressive dementia or hypertensive encephalopathy.  Worsening lethargy starting yesterday and progressive through day now more hypotensive, rising WBC, near obtundation, SOB prompting ICU eval.  Bladder scan with urinary retention CXR question evolving R infiltrate  Hx per chart review due to patient condition.  Pertinent  Medical History  Dementia, DM, HTN   Significant Hospital Events: Including procedures, antibiotic start and stop dates in addition to other pertinent events   08/08/22 admit 11/22 worsening mental status 11/23 ICU consult, intubated, started on pressors for profound shock with lactic acidosis.   Interim History / Subjective:   Shock is improved.  Continues on Levophed at 10 mcg.  Off vasopressin  Objective   Blood pressure 110/64, pulse 85, temperature 99.5 F (37.5 C), resp. rate 13, height 6' (1.829 m), weight 102.8 kg, SpO2 100 %.    Vent Mode: PSV;CPAP FiO2 (%):  [40 %] 40 % Set Rate:  [16 bmp] 16 bmp Vt Set:  [550 mL] 550 mL PEEP:  [5 cmH20] 5 cmH20 Pressure Support:  [10 cmH20] 10 cmH20 Plateau Pressure:  [15 cmH20] 15 cmH20   Intake/Output Summary (Last 24 hours) at 08/12/2022 0843 Last data filed at 08/12/2022 0600 Gross per 24 hour  Intake 6009.14 ml  Output 1390 ml  Net 4619.14 ml   Filed Weights   08/08/22 0800 08/09/22 0207  Weight: 103 kg 102.8 kg    Examination: Gen:      No acute distress HEENT:  EOMI, sclera anicteric, ETT Neck:     No masses; no thyromegaly Lungs:    Clear to auscultation bilaterally; normal  respiratory effort CV:         Regular rate and rhythm; no murmurs Abd:      + bowel sounds; soft, non-tender; no palpable masses, no distension Ext:    No edema; adequate peripheral perfusion Skin:      Warm and dry; no rash Neuro: Sedated  Labs/imaging reviewed Creatinine improved to 2.41, mag 1.5 Transaminases are elevated. WBC 27.7 Chest x-ray with endotracheal tube in the right mainstem bronchus, worsening lung opacities.  Resolved Hospital Problem list   N/A  Assessment & Plan:  Acute hypoxemic respiratory failure secondary to pneumonia Retract ET tube by 3.5 cm Follow chest x-ray SBTs when mental status improves  Severe septic shock secondary to UTI versus aspiration, HCAP E. coli bacteremia Antibiotics narrowed to ceftriaxone based on [facility] ID sensitivities Continue IV fluids.  Recheck lactic acid Echo noted with adequate LVEF Hold hypertension meds.  Acute renal failure secondary to sepsis, ATN Urinary retention Renal ultrasound with no hydronephrosis. Monitor urine output and creatinine.  Acute metabolic encephalopathy secondary to sepsis Baseline dementia Workup including MRI has been negative.  Diabetes Sliding scale insulin  GOC Ward of state Prognosis is guarded  Best Practice (right click and "Reselect all SmartList Selections" daily)   Diet/type: tubefeeds DVT prophylaxis: prophylactic heparin  GI prophylaxis: H2B Lines: Central line Foley:  Yes, and it is still needed Code Status:  full code Last date of multidisciplinary goals of care discussion [pending]  Critical  care time:   The patient is critically ill with multiple organ system failure and requires high complexity decision making for assessment and support, frequent evaluation and titration of therapies, advanced monitoring, review of radiographic studies and interpretation of complex data.   Critical Care Time devoted to patient care services, exclusive of separately billable  procedures, described in this note is 35 minutes.   Chilton Greathouse MD [facility] See Amion for pager  If no response to pager , please call [phone] until 7pm After 7:00 pm call Elink  [phone] 08/12/2022, 8:43 AM

## 2022-08-13 ENCOUNTER — Inpatient Hospital Stay (HOSPITAL_COMMUNITY): Payer: Medicare Other

## 2022-08-13 DIAGNOSIS — R6521 Severe sepsis with septic shock: Secondary | ICD-10-CM

## 2022-08-13 DIAGNOSIS — A419 Sepsis, unspecified organism: Secondary | ICD-10-CM

## 2022-08-13 LAB — GLUCOSE, CAPILLARY
Glucose-Capillary: 118 mg/dL — ABNORMAL HIGH (ref 70–99)
Glucose-Capillary: 135 mg/dL — ABNORMAL HIGH (ref 70–99)
Glucose-Capillary: 145 mg/dL — ABNORMAL HIGH (ref 70–99)
Glucose-Capillary: 158 mg/dL — ABNORMAL HIGH (ref 70–99)
Glucose-Capillary: 198 mg/dL — ABNORMAL HIGH (ref 70–99)
Glucose-Capillary: 188 mg/dL — ABNORMAL HIGH (ref 70–99)

## 2022-08-13 LAB — CBC
HCT: 29.9 % — ABNORMAL LOW (ref 39.0–52.0)
Hemoglobin: 9.8 g/dL — ABNORMAL LOW (ref 13.0–17.0)
MCH: 30.4 pg (ref 26.0–34.0)
MCHC: 32.8 g/dL (ref 30.0–36.0)
MCV: 92.9 fL (ref 80.0–100.0)
Platelets: 121 10*3/uL — ABNORMAL LOW (ref 150–400)
RBC: 3.22 MIL/uL — ABNORMAL LOW (ref 4.22–5.81)
RDW: 15.5 % (ref 11.5–15.5)
WBC: 30.1 10*3/uL — ABNORMAL HIGH (ref 4.0–10.5)
nRBC: 0 % (ref 0.0–0.2)

## 2022-08-13 LAB — BASIC METABOLIC PANEL
Anion gap: 12 (ref 5–15)
BUN: 54 mg/dL — ABNORMAL HIGH (ref 8–23)
CO2: 19 mmol/L — ABNORMAL LOW (ref 22–32)
Calcium: 7.9 mg/dL — ABNORMAL LOW (ref 8.9–10.3)
Chloride: 110 mmol/L (ref 98–111)
Creatinine, Ser: 1.65 mg/dL — ABNORMAL HIGH (ref 0.61–1.24)
GFR, Estimated: 45 mL/min — ABNORMAL LOW (ref >60)
Glucose, Bld: 169 mg/dL — ABNORMAL HIGH (ref 70–99)
Potassium: 4.3 mmol/L (ref 3.5–5.1)
Sodium: 141 mmol/L (ref 135–145)

## 2022-08-13 LAB — URINE CULTURE: Culture: 70000 — AB

## 2022-08-13 LAB — CULTURE, BLOOD (ROUTINE X 2): Special Requests: ADEQUATE

## 2022-08-13 LAB — LACTIC ACID, PLASMA: Lactic Acid, Venous: 2 mmol/L (ref 0.5–1.9)

## 2022-08-13 LAB — MAGNESIUM
Magnesium: 2.2 mg/dL (ref 1.7–2.4)
Magnesium: 2 mg/dL (ref 1.7–2.4)
Magnesium: 2.3 mg/dL (ref 1.7–2.4)

## 2022-08-13 LAB — PHOSPHORUS
Phosphorus: 3.6 mg/dL (ref 2.5–4.6)
Phosphorus: 3.3 mg/dL (ref 2.5–4.6)
Phosphorus: 2.6 mg/dL (ref 2.5–4.6)

## 2022-08-13 MED ORDER — VITAL 1.5 CAL PO LIQD
1000.0000 mL | ORAL | Status: DC
  Administered 2022-08-13 – 2022-08-25 (×11): 1000 mL
  Filled 2022-08-13 (×10): qty 1000

## 2022-08-13 MED ORDER — PROSOURCE TF20 ENFIT COMPATIBL EN LIQD
60.0000 mL | Freq: Every day | ENTERAL | Status: DC
  Administered 2022-08-13 – 2022-08-24 (×12): 60 mL
  Filled 2022-08-13 (×13): qty 60

## 2022-08-13 MED ORDER — LACTATED RINGERS IV SOLN: INTRAVENOUS | Status: DC

## 2022-08-13 MED ORDER — VITAL HIGH PROTEIN PO LIQD: 1000.0000 mL | ORAL | Status: DC

## 2022-08-13 MED ORDER — ADULT MULTIVITAMIN W/MINERALS CH
1.0000 | ORAL_TABLET | Freq: Every day | ORAL | Status: DC
  Administered 2022-08-14 – 2022-08-24 (×11): 1
  Filled 2022-08-13 (×12): qty 1

## 2022-08-13 NOTE — Plan of Care (Signed)
Patient received on [facility] ventilator settings as charted. Unable to tolerate weaning this morning due to sedation. Placed on weaning trial at 1600, 8/5 and 40%. Patient tolerating the weaning parameters well.

## 2022-08-13 NOTE — Progress Notes (Signed)
Nutrition Follow-up  DOCUMENTATION CODES:   Not applicable  INTERVENTION:   Tube feeds via OG tube: - Start Vital 1.5 @ 20 ml/hr and advance by 10 ml q 8 hours to goal rate of 55 ml/hr (1320 ml/day) - PROSource TF20 60 ml daily  Tube feeding regimen at goal rate provides 2060 kcal, 109 grams of protein, and 1008 ml of H2O.   Monitor magnesium, potassium, and phosphorus BID for at least 3 days, MD to replete as needed, as pt is at risk for refeeding syndrome given inadequate/minimal nutrition since admission (x 5 days).  - Continue MVI with minerals daily per tube  NUTRITION DIAGNOSIS:   Inadequate oral intake related to lethargy/confusion as evidenced by meal completion < 50%.  Ongoing, being addressed via initiation of tube feeds  GOAL:   Patient will meet greater than or equal to 90% of their needs  Progressing with initiation of tube feeds  MONITOR:   Vent status, Labs, Weight trends, TF tolerance  REASON FOR ASSESSMENT:   Ventilator, Consult Enteral/tube feeding initiation and management  ASSESSMENT:   Pt with hx of dementia, DM type 2, HTN, HLD, tobacco use, and prior CVA presented to ED after pt fell face forward onto concrete.  11/23 - transferred to ICU, intubated, started on pressors for profound shock  RD working remotely. Pt now with acute hypoxemic respiratory failure secondary to pneumonia as well as severe septic shock secondary to UTI versus aspiration/HCAP and AKI secondary to sepsis.  Pressor requirements improved today. Consult received for enteral nutrition initiation and management. Pt with OG tube in good position per x-ray this AM.  Pt admitted on 08/08/22 and started on a dysphagia 2 diet on 08/10/22 at 1305. Pt then made NPO upon transfer to the ICU on 08/11/22 at 0445. No meal completions charted. Suspect pt has had minimal PO intake since admission. Will start tube feeds at trickle rate and slowly advance to goal to promote tolerance. Will  monitor for refeeding syndrome.  Patient is currently intubated on ventilator support MV: 8.5 L/min Temp (24hrs), Avg:100.3 F (37.9 C), Min:97.6 F (36.4 C), Max:100.9 F (38.3 C) BP (a-line): 117/60 MAP (a-line): 79  Drips: Precedex Fentanyl Levophed: 9 mcg/min LR: 125 ml/hr  Medications reviewed and include: colace, pepcid, SSI q 4 hours, MVI with minerals, miralax, IV abx  Labs reviewed: BUN 54, creatinine 1.65, WBC 30.1, hemoglobin 9.8, platelets 121 CBG's: 82-135 x 24 hours  UOP: 892 ml x 24 hours I/O's: +5.3 L since admit  Diet Order:   Diet Order             Diet NPO time specified  Diet effective now                   EDUCATION NEEDS:   Not appropriate for education at this time  Skin:  Skin Assessment: Reviewed RN Assessment (laceration to the upper lip)  Last BM:  08/10/22  Height:   Ht Readings from Last 1 Encounters:  08/09/22 6' (1.829 m)    Weight:   Wt Readings from Last 1 Encounters:  08/09/22 102.8 kg    Ideal Body Weight:  80.9 kg  BMI:  Body mass index is 30.74 kg/m.  Estimated Nutritional Needs:   Kcal:  2000-2200 kcal/d  Protein:  100-115 g/d  Fluid:  2-2.2L/d    Mertie Clause, MS, RD, LDN Inpatient Clinical Dietitian Please see AMiON for contact information.

## 2022-08-13 NOTE — Progress Notes (Signed)
NAME:  Charles Fox, MRN:  [id number], DOB:  [birth year], LOS: 5 ADMISSION DATE:  08/08/2022, CONSULTATION DATE:  08/11/22 REFERRING MD:  Aundria Rud, CHIEF COMPLAINT:  confusion   History of Present Illness:  71 year old man hx of DM2, HTN, HLD, CVA, dementia resides in SNF ward [facility] the state presenting with AMS questionable fall and seizure.  Nasal fx on imaging.  Neuro workup neg (MRI/EEG), question raised of progressive dementia or hypertensive encephalopathy.  Worsening lethargy starting yesterday and progressive through day now more hypotensive, rising WBC, near obtundation, SOB prompting ICU eval.  Bladder scan with urinary retention CXR question evolving R infiltrate  Hx per chart review due to patient condition.  Pertinent  Medical History  Dementia, DM, HTN   Significant Hospital Events: Including procedures, antibiotic start and stop dates in addition to other pertinent events   08/08/22 admit 11/22 worsening mental status 11/23 ICU consult, intubated, started on pressors for profound shock with lactic acidosis. 11/24 Pressor requirements improved  Interim History / Subjective:   Continues on Levophed with low-grade fevers Urine output dropping.  Remains on the ventilator.  Objective   Blood pressure 99/61, pulse 67, temperature (!) 100.4 F (38 C), temperature source Esophageal, resp. rate 16, height 6' (1.829 m), weight 102.8 kg, SpO2 100 %.    Vent Mode: PRVC FiO2 (%):  [40 %] 40 % Set Rate:  [16 bmp] 16 bmp Vt Set:  [550 mL] 550 mL PEEP:  [5 cmH20] 5 cmH20 Pressure Support:  [5 cmH20] 5 cmH20 Plateau Pressure:  [20 cmH20] 20 cmH20   Intake/Output Summary (Last 24 hours) at 08/13/2022 0804 Last data filed at 08/13/2022 0700 Gross per 24 hour  Intake 1317.01 ml  Output 892 ml  Net 425.01 ml   Filed Weights   08/08/22 0800 08/09/22 0207  Weight: 103 kg 102.8 kg    Examination: Gen:      No acute distress, sedated HEENT:  EOMI, sclera anicteric Neck:      No masses; no thyromegaly Lungs:    Clear to auscultation bilaterally; normal respiratory effort CV:         Regular rate and rhythm; no murmurs Abd:      + bowel sounds; soft, non-tender; no palpable masses, no distension Ext:    No edema; adequate peripheral perfusion Skin:      Warm and dry; no rash Neuro: sedated  Labs/imaging reviewed Creatinine improved to 1.65 WBC 30.1  Resolved Hospital Problem list   N/A  Assessment & Plan:  Acute hypoxemic respiratory failure secondary to pneumonia Failing SBT's.  Will wean sedation and try again Follow chest x-ray today  Severe septic shock secondary to UTI versus aspiration, HCAP E. coli bacteremia Antibiotics narrowed to ceftriaxone based on [facility] ID sensitivities Continue IV fluids.  Recheck lactic acid Echo noted with adequate LVEF Hold hypertension meds.  Acute renal failure secondary to sepsis, ATN Urinary retention Renal ultrasound with no hydronephrosis. Monitor urine output and creatinine.  Acute metabolic encephalopathy secondary to sepsis Baseline dementia Workup including MRI has been negative.  Diabetes Sliding scale insulin  GOC [place] of state Prognosis is guarded Son called on the telephone but there was no answer.  Best Practice (right click and "Reselect all SmartList Selections" daily)   Diet/type: tubefeeds DVT prophylaxis: prophylactic heparin  GI prophylaxis: H2B Lines: Central line Foley:  Yes, and it is still needed Code Status:  full code Last date of multidisciplinary goals of care discussion [pending]  Critical care  time:   The patient is critically ill with multiple organ system failure and requires high complexity decision making for assessment and support, frequent evaluation and titration of therapies, advanced monitoring, review of radiographic studies and interpretation of complex data.   Critical Care Time devoted to patient care services, exclusive of separately billable procedures,  described in this note is 35 minutes.   Chilton Greathouse MD [place] [place] [facility] See Amion for pager  If no response to pager , please call [phone] until 7pm After 7:00 pm call Elink  [phone] 08/13/2022, 8:20 AM

## 2022-08-14 ENCOUNTER — Inpatient Hospital Stay (HOSPITAL_COMMUNITY): Payer: Medicare Other

## 2022-08-14 LAB — COMPREHENSIVE METABOLIC PANEL
ALT: 235 U/L — ABNORMAL HIGH (ref 0–44)
AST: 112 U/L — ABNORMAL HIGH (ref 15–41)
Albumin: 2.1 g/dL — ABNORMAL LOW (ref 3.5–5.0)
Alkaline Phosphatase: 118 U/L (ref 38–126)
Anion gap: 10 (ref 5–15)
BUN: 58 mg/dL — ABNORMAL HIGH (ref 8–23)
CO2: 21 mmol/L — ABNORMAL LOW (ref 22–32)
Calcium: 7.7 mg/dL — ABNORMAL LOW (ref 8.9–10.3)
Chloride: 110 mmol/L (ref 98–111)
Creatinine, Ser: 1.24 mg/dL (ref 0.61–1.24)
GFR, Estimated: >60 mL/min (ref >60)
Glucose, Bld: 226 mg/dL — ABNORMAL HIGH (ref 70–99)
Potassium: 3.9 mmol/L (ref 3.5–5.1)
Sodium: 141 mmol/L (ref 135–145)
Total Bilirubin: 1 mg/dL (ref 0.3–1.2)
Total Protein: 5.4 g/dL — ABNORMAL LOW (ref 6.5–8.1)

## 2022-08-14 LAB — GLUCOSE, CAPILLARY
Glucose-Capillary: 191 mg/dL — ABNORMAL HIGH (ref 70–99)
Glucose-Capillary: 172 mg/dL — ABNORMAL HIGH (ref 70–99)
Glucose-Capillary: 207 mg/dL — ABNORMAL HIGH (ref 70–99)
Glucose-Capillary: 306 mg/dL — ABNORMAL HIGH (ref 70–99)
Glucose-Capillary: 239 mg/dL — ABNORMAL HIGH (ref 70–99)
Glucose-Capillary: 254 mg/dL — ABNORMAL HIGH (ref 70–99)

## 2022-08-14 LAB — MAGNESIUM
Magnesium: 2.4 mg/dL (ref 1.7–2.4)
Magnesium: 2.2 mg/dL (ref 1.7–2.4)

## 2022-08-14 LAB — CBC
HCT: 28.9 % — ABNORMAL LOW (ref 39.0–52.0)
Hemoglobin: 9.2 g/dL — ABNORMAL LOW (ref 13.0–17.0)
MCH: 29.7 pg (ref 26.0–34.0)
MCHC: 31.8 g/dL (ref 30.0–36.0)
MCV: 93.2 fL (ref 80.0–100.0)
Platelets: 112 10*3/uL — ABNORMAL LOW (ref 150–400)
RBC: 3.1 MIL/uL — ABNORMAL LOW (ref 4.22–5.81)
RDW: 15.8 % — ABNORMAL HIGH (ref 11.5–15.5)
WBC: 27 10*3/uL — ABNORMAL HIGH (ref 4.0–10.5)
nRBC: 0.1 % (ref 0.0–0.2)

## 2022-08-14 LAB — PHOSPHORUS
Phosphorus: 1.8 mg/dL — ABNORMAL LOW (ref 2.5–4.6)
Phosphorus: 2.4 mg/dL — ABNORMAL LOW (ref 2.5–4.6)

## 2022-08-14 MED ORDER — POTASSIUM PHOSPHATES 15 MMOLE/5ML IV SOLN
30.0000 mmol | Freq: Once | INTRAVENOUS | Status: AC
  Administered 2022-08-14: 30 mmol via INTRAVENOUS
  Filled 2022-08-14: qty 10

## 2022-08-14 MED ORDER — INSULIN ASPART 100 UNIT/ML IJ SOLN
0.0000 [IU] | INTRAMUSCULAR | Status: DC
  Administered 2022-08-14: 11 [IU] via SUBCUTANEOUS
  Administered 2022-08-14 (×2): 5 [IU] via SUBCUTANEOUS
  Administered 2022-08-15: 2 [IU] via SUBCUTANEOUS
  Administered 2022-08-15 (×3): 8 [IU] via SUBCUTANEOUS
  Administered 2022-08-15: 3 [IU] via SUBCUTANEOUS
  Administered 2022-08-15 – 2022-08-16 (×7): 5 [IU] via SUBCUTANEOUS
  Administered 2022-08-16: 3 [IU] via SUBCUTANEOUS
  Administered 2022-08-17: 5 [IU] via SUBCUTANEOUS
  Administered 2022-08-17: 3 [IU] via SUBCUTANEOUS
  Administered 2022-08-17: 2 [IU] via SUBCUTANEOUS
  Administered 2022-08-17: 3 [IU] via SUBCUTANEOUS
  Administered 2022-08-17: 2 [IU] via SUBCUTANEOUS
  Administered 2022-08-17: 5 [IU] via SUBCUTANEOUS
  Administered 2022-08-18: 2 [IU] via SUBCUTANEOUS
  Administered 2022-08-18 (×3): 3 [IU] via SUBCUTANEOUS
  Administered 2022-08-18 – 2022-08-19 (×4): 2 [IU] via SUBCUTANEOUS
  Administered 2022-08-20: 3 [IU] via SUBCUTANEOUS
  Administered 2022-08-20: 2 [IU] via SUBCUTANEOUS
  Administered 2022-08-20 – 2022-08-21 (×2): 3 [IU] via SUBCUTANEOUS
  Administered 2022-08-22 (×2): 2 [IU] via SUBCUTANEOUS
  Administered 2022-08-22 (×2): 3 [IU] via SUBCUTANEOUS
  Administered 2022-08-22: 2 [IU] via SUBCUTANEOUS
  Administered 2022-08-23 (×4): 3 [IU] via SUBCUTANEOUS
  Administered 2022-08-24 (×2): 2 [IU] via SUBCUTANEOUS
  Administered 2022-08-24: 3 [IU] via SUBCUTANEOUS
  Administered 2022-08-24: 5 [IU] via SUBCUTANEOUS
  Administered 2022-08-24: 2 [IU] via SUBCUTANEOUS
  Administered 2022-08-25: 5 [IU] via SUBCUTANEOUS
  Administered 2022-08-25: 8 [IU] via SUBCUTANEOUS
  Administered 2022-08-25 (×3): 3 [IU] via SUBCUTANEOUS
  Administered 2022-08-26: 5 [IU] via SUBCUTANEOUS

## 2022-08-14 MED ORDER — INSULIN GLARGINE-YFGN 100 UNIT/ML ~~LOC~~ SOLN
8.0000 [IU] | Freq: Every day | SUBCUTANEOUS | Status: DC
  Administered 2022-08-14 – 2022-08-15 (×2): 8 [IU] via SUBCUTANEOUS
  Filled 2022-08-14 (×3): qty 0.08

## 2022-08-14 NOTE — Progress Notes (Addendum)
NAME:  Charles Fox, MRN:  [id number], DOB:  01-31-1951, LOS: 6 ADMISSION DATE:  08/08/2022, CONSULTATION DATE:  08/11/22 REFERRING MD:  Aundria Rud, CHIEF COMPLAINT:  confusion   History of Present Illness:  71 year old man hx of DM2, HTN, HLD, CVA, dementia resides in SNF ward [facility] the state presenting with AMS questionable fall and seizure.  Nasal fx on imaging.  Neuro workup neg (MRI/EEG), question raised of progressive dementia or hypertensive encephalopathy.  Worsening lethargy starting yesterday and progressive through day now more hypotensive, rising WBC, near obtundation, SOB prompting ICU eval.  Bladder scan with urinary retention CXR question evolving R infiltrate  Hx per chart review due to patient condition.  Pertinent  Medical History  Dementia, DM, HTN   Significant Hospital Events: Including procedures, antibiotic start and stop dates in addition to other pertinent events   08/08/22 admit 11/22 worsening mental status 11/23 ICU consult, intubated, started on pressors for profound shock with lactic acidosis. 11/24 Pressor requirements improved  Interim History / Subjective:   Continues on Levophed.  Fevers have improved.  Objective   Blood pressure (!) 92/59, pulse 61, temperature 98.6 F (37 C), resp. rate 16, height 6' (1.829 m), weight 102.8 kg, SpO2 100 %.    Vent Mode: PRVC FiO2 (%):  [40 %] 40 % Set Rate:  [16 bmp] 16 bmp Vt Set:  [550 mL] 550 mL PEEP:  [5 cmH20] 5 cmH20 Pressure Support:  [8 cmH20] 8 cmH20 Plateau Pressure:  [19 cmH20] 19 cmH20   Intake/Output Summary (Last 24 hours) at 08/14/2022 0844 Last data filed at 08/14/2022 0800 Gross per 24 hour  Intake 3846.61 ml  Output 1160 ml  Net 2686.61 ml   Filed Weights   08/08/22 0800 08/09/22 0207  Weight: 103 kg 102.8 kg    Examination: Gen:      No acute distress, sedated HEENT:  EOMI, sclera anicteric Neck:     No masses; no thyromegaly, ETT Lungs:    Clear to auscultation bilaterally;  normal respiratory effort CV:         Regular rate and rhythm; no murmurs Abd:      + bowel sounds; soft, non-tender; no palpable masses, no distension Ext:    No edema; adequate peripheral perfusion Skin:      Warm and dry; no rash Neuro: sedated, opens eyes to stimuli  Labs/imaging reviewed Significant for BUN and creatinine to 1.24, AST 112, ALT 235 WBC 27 Chest x-ray with basal atelectasis  Resolved Hospital Problem list   N/A  Assessment & Plan:  Acute hypoxemic respiratory failure secondary to sepsis Failing SBT's.  Will wean sedation and try again Follow chest x-ray today  Severe septic shock secondary to UTI versus aspiration, HCAP E. coli bacteremia Antibiotics narrowed to ceftriaxone based on [facility] ID sensitivities Continue IV fluids.  Recheck lactic acid Echo noted with adequate LVEF Hold hypertension meds.  Acute renal failure secondary to sepsis, ATN Urinary retention Renal ultrasound with no hydronephrosis. Monitor urine output and creatinine.  Acute metabolic encephalopathy secondary to sepsis Baseline dementia Workup including MRI has been negative.  Diabetes Sliding scale insulin Increase to moderate scale as sugars are high Start lantus 8 units  Elevated liver enzymes Likely secondary to shock, sepsis Monitor  GOC [place] of state Prognosis is guarded Son called on the telephone but there was no answer.  Best Practice (right click and "Reselect all SmartList Selections" daily)   Diet/type: tubefeeds DVT prophylaxis: prophylactic heparin  GI prophylaxis:  H2B Lines: Central line Foley:  Yes, and it is still needed Code Status:  full code Last date of multidisciplinary goals of care discussion [pending]  Critical care time:   The patient is critically ill with multiple organ system failure and requires high complexity decision making for assessment and support, frequent evaluation and titration of therapies, advanced monitoring, review of  radiographic studies and interpretation of complex data.   Critical Care Time devoted to patient care services, exclusive of separately billable procedures, described in this note is 35 minutes.   Chilton Greathouse MD [place] [facility] See Amion for pager  If no response to pager , please call [phone] until 7pm After 7:00 pm call Elink  [phone] 08/14/2022, 8:44 AM

## 2022-08-14 NOTE — Plan of Care (Signed)
Patient received on PRVC ventilation support. Placed on weaning trial at 1400, PSV 10/5. Tolerating this well and remains on weaning settings.

## 2022-08-14 NOTE — Progress Notes (Signed)
[facility] ADULT ICU REPLACEMENT PROTOCOL   The patient does apply for the [facility] Adult ICU Electrolyte Replacment Protocol based on the criteria listed below:   1.Exclusion criteria: TCTS, ECMO, Dialysis, and Myasthenia Gravis patients 2. Is GFR >/= 30 ml/min? Yes.    Patient's GFR today is >60 3. Is SCr </= 2? Yes.   Patient's SCr is 1.24 mg/dL 4. Did SCr increase >/= 0.5 in 24 hours? No. 5.Pt's weight >40kg  Yes.   6. Abnormal electrolyte(s): Phos  7. Electrolytes replaced per protocol 8.  Call MD STAT for K+ </= 2.5, Phos </= 1, or Mag </= 1 Physician:  Halina Andreas E Tavaughn Silguero 08/14/2022 6:10 AM

## 2022-08-15 DIAGNOSIS — G9340 Encephalopathy, unspecified: Secondary | ICD-10-CM | POA: Insufficient documentation

## 2022-08-15 DIAGNOSIS — A4150 Gram-negative sepsis, unspecified: Secondary | ICD-10-CM

## 2022-08-15 DIAGNOSIS — A4151 Sepsis due to Escherichia coli [E. coli]: Secondary | ICD-10-CM | POA: Insufficient documentation

## 2022-08-15 DIAGNOSIS — R6521 Severe sepsis with septic shock: Secondary | ICD-10-CM | POA: Insufficient documentation

## 2022-08-15 DIAGNOSIS — A419 Sepsis, unspecified organism: Secondary | ICD-10-CM

## 2022-08-15 LAB — GLUCOSE, CAPILLARY
Glucose-Capillary: 220 mg/dL — ABNORMAL HIGH (ref 70–99)
Glucose-Capillary: 209 mg/dL — ABNORMAL HIGH (ref 70–99)
Glucose-Capillary: 261 mg/dL — ABNORMAL HIGH (ref 70–99)
Glucose-Capillary: 148 mg/dL — ABNORMAL HIGH (ref 70–99)
Glucose-Capillary: 190 mg/dL — ABNORMAL HIGH (ref 70–99)
Glucose-Capillary: 254 mg/dL — ABNORMAL HIGH (ref 70–99)

## 2022-08-15 LAB — COMPREHENSIVE METABOLIC PANEL
ALT: 197 U/L — ABNORMAL HIGH (ref 0–44)
AST: 109 U/L — ABNORMAL HIGH (ref 15–41)
Albumin: 1.8 g/dL — ABNORMAL LOW (ref 3.5–5.0)
Alkaline Phosphatase: 218 U/L — ABNORMAL HIGH (ref 38–126)
Anion gap: 7 (ref 5–15)
BUN: 37 mg/dL — ABNORMAL HIGH (ref 8–23)
CO2: 23 mmol/L (ref 22–32)
Calcium: 7.8 mg/dL — ABNORMAL LOW (ref 8.9–10.3)
Chloride: 114 mmol/L — ABNORMAL HIGH (ref 98–111)
Creatinine, Ser: 0.91 mg/dL (ref 0.61–1.24)
GFR, Estimated: >60 mL/min (ref >60)
Glucose, Bld: 277 mg/dL — ABNORMAL HIGH (ref 70–99)
Potassium: 4 mmol/L (ref 3.5–5.1)
Sodium: 144 mmol/L (ref 135–145)
Total Bilirubin: 0.6 mg/dL (ref 0.3–1.2)
Total Protein: 4.9 g/dL — ABNORMAL LOW (ref 6.5–8.1)

## 2022-08-15 LAB — CBC
HCT: 27.6 % — ABNORMAL LOW (ref 39.0–52.0)
Hemoglobin: 9 g/dL — ABNORMAL LOW (ref 13.0–17.0)
MCH: 30.4 pg (ref 26.0–34.0)
MCHC: 32.6 g/dL (ref 30.0–36.0)
MCV: 93.2 fL (ref 80.0–100.0)
Platelets: 122 10*3/uL — ABNORMAL LOW (ref 150–400)
RBC: 2.96 MIL/uL — ABNORMAL LOW (ref 4.22–5.81)
RDW: 16 % — ABNORMAL HIGH (ref 11.5–15.5)
WBC: 24.8 10*3/uL — ABNORMAL HIGH (ref 4.0–10.5)
nRBC: 0.1 % (ref 0.0–0.2)

## 2022-08-15 LAB — MAGNESIUM: Magnesium: 2.1 mg/dL (ref 1.7–2.4)

## 2022-08-15 LAB — CULTURE, RESPIRATORY W GRAM STAIN: Culture: NO GROWTH

## 2022-08-15 LAB — PHOSPHORUS: Phosphorus: 1.7 mg/dL — ABNORMAL LOW (ref 2.5–4.6)

## 2022-08-15 MED ORDER — QUETIAPINE FUMARATE 100 MG PO TABS
100.0000 mg | ORAL_TABLET | Freq: Two times a day (BID) | ORAL | Status: DC
  Administered 2022-08-15 – 2022-08-17 (×5): 100 mg
  Filled 2022-08-15 (×5): qty 1

## 2022-08-15 MED ORDER — QUETIAPINE FUMARATE 25 MG PO TABS: 75.0000 mg | ORAL_TABLET | Freq: Every day | ORAL | Status: DC

## 2022-08-15 MED ORDER — OXYCODONE HCL 5 MG PO TABS
10.0000 mg | ORAL_TABLET | Freq: Four times a day (QID) | ORAL | Status: DC
  Administered 2022-08-15 – 2022-08-17 (×7): 10 mg
  Filled 2022-08-15 (×7): qty 2

## 2022-08-15 MED ORDER — ATORVASTATIN CALCIUM 40 MG PO TABS
40.0000 mg | ORAL_TABLET | Freq: Every day | ORAL | Status: DC
  Administered 2022-08-15 – 2022-08-24 (×10): 40 mg
  Filled 2022-08-15 (×11): qty 1

## 2022-08-15 MED ORDER — CEFAZOLIN SODIUM-DEXTROSE 2-4 GM/100ML-% IV SOLN
2.0000 g | Freq: Three times a day (TID) | INTRAVENOUS | Status: AC
  Administered 2022-08-15 – 2022-08-19 (×14): 2 g via INTRAVENOUS
  Filled 2022-08-15 (×14): qty 100

## 2022-08-15 MED ORDER — POTASSIUM PHOSPHATES 15 MMOLE/5ML IV SOLN
30.0000 mmol | Freq: Once | INTRAVENOUS | Status: AC
  Administered 2022-08-15: 30 mmol via INTRAVENOUS
  Filled 2022-08-15: qty 10

## 2022-08-15 MED ORDER — FAMOTIDINE 20 MG PO TABS
20.0000 mg | ORAL_TABLET | Freq: Two times a day (BID) | ORAL | Status: DC
  Administered 2022-08-15 – 2022-08-19 (×9): 20 mg
  Filled 2022-08-15 (×9): qty 1

## 2022-08-15 MED ORDER — INSULIN ASPART 100 UNIT/ML IJ SOLN
4.0000 [IU] | INTRAMUSCULAR | Status: DC
  Administered 2022-08-15 – 2022-08-16 (×7): 4 [IU] via SUBCUTANEOUS

## 2022-08-15 MED ORDER — FINASTERIDE 5 MG PO TABS: 5.0000 mg | ORAL_TABLET | Freq: Every day | ORAL | Status: DC

## 2022-08-15 MED ORDER — CITALOPRAM HYDROBROMIDE 40 MG PO TABS
40.0000 mg | ORAL_TABLET | Freq: Every day | ORAL | Status: DC
  Administered 2022-08-15 – 2022-08-24 (×10): 40 mg
  Filled 2022-08-15 (×2): qty 1
  Filled 2022-08-15 (×2): qty 4
  Filled 2022-08-15 (×4): qty 1
  Filled 2022-08-15 (×3): qty 4

## 2022-08-15 MED ORDER — ASPIRIN 81 MG PO CHEW
81.0000 mg | CHEWABLE_TABLET | Freq: Every day | ORAL | Status: DC
  Administered 2022-08-15 – 2022-08-24 (×10): 81 mg
  Filled 2022-08-15 (×11): qty 1

## 2022-08-15 MED ORDER — SODIUM CHLORIDE 0.9 % IV BOLUS
1000.0000 mL | Freq: Once | INTRAVENOUS | Status: AC
  Administered 2022-08-15: 1000 mL via INTRAVENOUS

## 2022-08-15 NOTE — Evaluation (Signed)
Occupational Therapy Re-evaluation Patient Details Name: Charles Fox MRN: [id number] DOB: 1951-09-07 Today's Date: 08/15/2022   History of Present Illness [name] is a 71 yo M admittted 11/20 after being found down outside of his group home.  PMH of dementia, T2DM, Htn, HLD, CVA, housing insecurity, and tobacco use disorder.  nasal fx noted on admission with worsening encephalopathy and resipiratory status.  Transferred to ICU 11/23 and intubated.  Now with sepsis, UTI with septic shock, and PNA.   Clinical Impression   Pt currently sedated and on the ventilator.  Not following one step commands at this time.  Opens eyes briefly to verbal and tactile stimulation but does not sustain.  Total +2 for supine to sit EOB to increase stimulation with overall total assist for sitting balance.  Max hand over hand needed for washing face from supported position in bed with the LUE.  Will benefit from OT trial to further eval and progress ADL function so that he can return to PLOF.  May need SNF for follow-up therapy depending on progress as he becomes more attentive.  Will continue to follow.        Recommendations for follow up therapy are one component of a multi-disciplinary discharge planning process, led by the attending physician.  Recommendations may be updated based on patient status, additional functional criteria and insurance authorization.   Follow Up Recommendations  Skilled nursing-short term rehab (<3 hours/day)     Assistance Recommended at Discharge Frequent or constant Supervision/Assistance  Patient can return home with the following Direct supervision/assist for medications management;Direct supervision/assist for financial management;Assistance with cooking/housework;Assist for transportation;Help with stairs or ramp for entrance;Two people to help with walking and/or transfers;Two people to help with bathing/dressing/bathroom    Functional Status Assessment   Patient has had a recent decline in their functional status and demonstrates the ability to make significant improvements in function in a reasonable and predictable amount of time.  Equipment Recommendations  Other (comment) (TBD)       Precautions / Restrictions Precautions Precautions: Fall Precaution Comments: intubated, sedated Restrictions Weight Bearing Restrictions: No      Mobility Bed Mobility Overal bed mobility: Needs Assistance Bed Mobility: Supine to Sit, Sit to Supine     Supine to sit: Total assist, +2 for physical assistance, HOB elevated Sit to supine: Total assist, +2 for physical assistance   General bed mobility comments: Pt needed total assist for all aspect of bed mobility.    Transfers                          Balance Overall balance assessment: Needs assistance Sitting-balance support: No upper extremity supported, Feet supported Sitting balance-Leahy Scale: Zero Sitting balance - Comments: Pt needed total assist for static sitting balance EOB.                                   ADL either performed or assessed with clinical judgement   ADL Overall ADL's : Needs assistance/impaired     Grooming: Total assistance;Bed level Grooming Details (indicate cue type and reason): hand over hand assist to wash part of forehead an face with washcloth.                               General ADL Comments: Pt kept eyes closed majority  of session, opening them briefly to tactile and verbal stimulation.  Transferred to the EOB with total +2 and nursing managing central IV line.  Total assist needed for static sitting balance EOB for 5 mins.  Vitals remained stable with pt on the vent at 40% FiO2 and PEEP at 5.  Transitioned back to supine at end of session with nursing in to manage IV     Vision Ability to See in Adequate Light:  (unable to determine) Patient Visual Report: Other (comment) (unable to state this  session) Additional Comments: Pt opening eyes briefly to stimulation with gaze at midline or slightly right.  Would not track to command or move gaze to the left of midline.            Pertinent Vitals/Pain Pain Assessment Faces Pain Scale: No hurt        Extremity/Trunk Assessment Upper Extremity Assessment Upper Extremity Assessment: Difficult to assess due to impaired cognition   Lower Extremity Assessment Lower Extremity Assessment: Defer to PT evaluation       Communication Communication Communication: Other (comment) (intubated)   Cognition Arousal/Alertness: Lethargic, Suspect due to medications Behavior During Therapy: Restless Overall Cognitive Status: Impaired/Different from baseline Area of Impairment: Following commands                       Following Commands:  (Pt not following commands at this time)       General Comments: pt would open eyes to name majority of time however did not follow any other commands, no noted tracking of eyes however also aware pt remains on sedation and vent.  Will continue to asses in treatment.     General Comments  VSS, noted coughing with raising of bed, RN present            Home Living Family/patient expects to be discharged to:: Group home Living Arrangements: Group Home Available Help at Discharge: Available 24 hours/day Type of Home: Group Home                       Home Equipment: Rolling Walker (2 wheels);BSC/3in1   Additional Comments: Chart states that pt is from a group home.  Pt states he has a RW and 3N1      Prior Functioning/Environment Prior Level of Function : Independent/Modified Independent             Mobility Comments: uses RW outside only; no history of falls ADLs Comments: staff assists with VC however will assist if needed; staff states that he likes to be independent.  Pt unable to state anything secondary to intubation.  All PLOF copy forward from previous evals.         OT Problem List: Decreased activity tolerance;Decreased safety awareness;Decreased cognition;Decreased strength;Impaired balance (sitting and/or standing)      OT Treatment/Interventions: Self-care/ADL training;Therapeutic exercise;DME and/or AE instruction;Therapeutic activities;Cognitive remediation/compensation;Patient/family education;Balance training    OT Goals(Current goals can be found in the care plan section) Acute Rehab OT Goals Patient Stated Goal: Pt unable to state at this time. OT Goal Formulation: Patient unable to participate in goal setting Time For Goal Achievement: 08/29/22 Potential to Achieve Goals: Fair  OT Frequency: Min 2X/week    Co-evaluation PT/OT/SLP Co-Evaluation/Treatment: Yes Reason for Co-Treatment: Complexity of the patient's impairments (multi-system involvement);For patient/therapist safety PT goals addressed during session: Mobility/safety with mobility OT goals addressed during session: ADL's and self-care      AM-PAC OT "6 Clicks"  Daily Activity     Outcome Measure Help from another person eating meals?: Total Help from another person taking care of personal grooming?: Total Help from another person toileting, which includes using toliet, bedpan, or urinal?: Total Help from another person bathing (including washing, rinsing, drying)?: Total Help from another person to put on and taking off regular upper body clothing?: Total Help from another person to put on and taking off regular lower body clothing?: Total 6 Click Score: 6   End of Session Nurse Communication: Mobility status  Activity Tolerance: Patient limited by lethargy Patient left: in bed;with call bell/phone within reach;with nursing/sitter in room  OT Visit Diagnosis: Unsteadiness on feet (R26.81);History of falling (Z91.81);Other symptoms and signs involving cognitive function;Muscle weakness (generalized) (M62.81)                Time: 4403-4742 OT Time Calculation  (min): 23 min Charges:  OT General Charges $OT Visit: 1 Visit OT Evaluation $OT Re-eval: 1 Re-eval  Aianna Fahs OTR/L 08/15/2022, 2:24 PM

## 2022-08-15 NOTE — Progress Notes (Addendum)
Patient has a DSS appointed Christella Hartigan [phone]). CSW left her a voicemail.   Joaquin Courts, MSW, [facility]

## 2022-08-15 NOTE — Progress Notes (Signed)
RT called by RN stating per CCM due to increase in pressors, RN placed pt back in full ventilatory support with previous settings. RT came to bedside to assess. Pt tolerated well with SVS.

## 2022-08-15 NOTE — Progress Notes (Addendum)
Physical Therapy RE-EVALUATION Patient Details Name: Charles Fox MRN: [id number] DOB: [birth year] Today's Date: 08/15/2022   History of Present Illness [name] is a 71 yo M admittted 11/20 after being found down outside of his group home, nasal fx noted. Pt became lethargic with SOB, intubated on 11/23 found to have sepsis and PNA.  PMH of dementia, T2DM, Htn, HLD, CVA, housing insecurity, and tobacco use disorder.    PT Comments    Pt with regression medically requiring intubation and sedation. PT and OT did attempt to sit pt EOB as pt was ambulating just a couple days ago. Pt with no command follow and difficulty keeping eyes open however aware pt is on sedation. Unable to lift sedation at this time per RN due to pt becoming agitated, tachycardic and asynchronous with the vent. Acute PT to cont to follow.    Recommendations for follow up therapy are one component of a multi-disciplinary discharge planning process, led by the attending physician.  Recommendations may be updated based on patient status, additional functional criteria and insurance authorization.  Follow Up Recommendations  Skilled nursing-short term rehab (<3 hours/day) (TBA once meds clear) Can patient physically be transported by private vehicle: No   Assistance Recommended at Discharge Frequent or constant Supervision/Assistance  Patient can return home with the following Assistance with cooking/housework;Assist for transportation;Help with stairs or ramp for entrance;A lot of help with walking and/or transfers;A lot of help with bathing/dressing/bathroom;Assistance with feeding;Direct supervision/assist for financial management   Equipment Recommendations  Other (comment) (TBD)    Recommendations for Other Services       Precautions / Restrictions Precautions Precautions: Fall Precaution Comments: intubated, sedated Restrictions Weight Bearing Restrictions: No     Mobility  Bed  Mobility Overal bed mobility: Needs Assistance Bed Mobility: Supine to Sit, Sit to Supine     Supine to sit: Total assist, +2 for physical assistance, HOB elevated Sit to supine: Total assist, +2 for physical assistance   General bed mobility comments: completed helicopter transfer with [facility] all the way up and RN management vent/ET tube. Pt with initial eye opening upon sitting up however then kept eyes closed, no active participation    Transfers                   General transfer comment: pt now intubated and sedated without command follow, not appropriate to try sit to stand    Ambulation/Gait                   Stairs             Wheelchair Mobility    Modified Rankin (Stroke Patients Only)       Balance Overall balance assessment: Needs assistance Sitting-balance support: No upper extremity supported, Feet supported Sitting balance-Leahy Scale: Zero Sitting balance - Comments: pt dependent on total posterior support                                    Cognition Arousal/Alertness: Lethargic, Suspect due to medications (unable to fully wean off of sedation due to pt becoming agitated and trying to breath over ET tube) Behavior During Therapy: Restless Overall Cognitive Status: Impaired/Different from baseline Area of Impairment: Following commands                       Following Commands: Follows one step commands with increased  time       General Comments: pt would open eyes to name majority of time however did not follow any other commands, no noted tracking of eyes however also aware pt remains on sedation        Exercises      General Comments General comments (skin integrity, edema, etc.): VSS, noted coughing with raising of bed, RN present      Pertinent Vitals/Pain Pain Assessment Pain Assessment: Faces Faces Pain Scale: No hurt    Home Living                          Prior Function             PT Goals (current goals can now be found in the care plan section) Acute Rehab PT Goals Patient Stated Goal: unable to state PT Goal Formulation: With patient Time For Goal Achievement: 08/22/22 Potential to Achieve Goals: Fair Progress towards PT goals: Not progressing toward goals - comment (pt now intubated/sedated)    Frequency    Min 3X/week      PT Plan Current plan remains appropriate    Co-evaluation PT/OT/SLP Co-Evaluation/Treatment: Yes Reason for Co-Treatment: Complexity of the patient's impairments (multi-system involvement);For patient/therapist safety PT goals addressed during session: Mobility/safety with mobility OT goals addressed during session: ADL's and self-care      AM-PAC PT "6 Clicks" Mobility   Outcome Measure  Help needed turning from your back to your side while in a flat bed without using bedrails?: Total Help needed moving from lying on your back to sitting on the side of a flat bed without using bedrails?: Total Help needed moving to and from a bed to a chair (including a wheelchair)?: Total Help needed standing up from a chair using your arms (e.g., wheelchair or bedside chair)?: Total Help needed to walk in hospital room?: Total Help needed climbing 3-5 steps with a railing? : Total 6 Click Score: 6    End of Session   Activity Tolerance: Patient limited by lethargy;Treatment limited secondary to medical complications (Comment) Patient left: with call bell/phone within reach;in bed;with bed alarm set;with restraints reapplied;with nursing/sitter in room (mits) Nurse Communication: Mobility status PT Visit Diagnosis: Unsteadiness on feet (R26.81);Muscle weakness (generalized) (M62.81)     Time: 1610-9604 PT Time Calculation (min) (ACUTE ONLY): 23 min  Charges:                        Lewis Shock, PT, DPT Acute Rehabilitation Services Secure chat preferred Office #: [phone]    Iona Hansen 08/15/2022, 2:01 PM

## 2022-08-15 NOTE — Progress Notes (Addendum)
NAME:  Charles Fox, MRN:  [id number], DOB:  [birth year], LOS: 7 ADMISSION DATE:  08/08/2022, CONSULTATION DATE:  08/11/22 REFERRING MD:  Aundria Rud, CHIEF COMPLAINT:  confusion   History of Present Illness:  71 year old man hx of DM2, HTN, HLD, CVA, dementia resides in SNF ward of the state presenting with AMS questionable fall and seizure.  Nasal fx on imaging.  Neuro workup neg (MRI/EEG), question raised of progressive dementia or hypertensive encephalopathy.  Worsening lethargy starting yesterday and progressive through day now more hypotensive, rising WBC, near obtundation, SOB prompting ICU eval.  Bladder scan with urinary retention CXR question evolving R infiltrate  Hx per chart review due to patient condition.  Pertinent  Medical History  Dementia, DM, HTN   Significant Hospital Events: Including procedures, antibiotic start and stop dates in addition to other pertinent events   08/08/22 admit 11/22 worsening mental status 11/23 ICU consult, intubated, started on pressors for profound shock with lactic acidosis. 11/24 Pressor requirements improved  Interim History / Subjective:  Remains on levo 5 mcg; map 77 Remains altered not following commands; on 75 fentanyl and precedex 0.5  Objective   Blood pressure (!) 132/59, pulse 62, temperature 98.4 F (36.9 C), resp. rate 17, height 6' (1.829 m), weight 102.8 kg, SpO2 100 %.    Vent Mode: PRVC FiO2 (%):  [40 %] 40 % Set Rate:  [16 bmp] 16 bmp Vt Set:  [550 mL] 550 mL PEEP:  [5 cmH20] 5 cmH20 Pressure Support:  [10 cmH20] 10 cmH20 Plateau Pressure:  [19 cmH20] 19 cmH20   Intake/Output Summary (Last 24 hours) at 08/15/2022 0847 Last data filed at 08/15/2022 0700 Gross per 24 hour  Intake 4965.28 ml  Output 2203 ml  Net 2762.28 ml    Filed Weights   08/08/22 0800 08/09/22 0207  Weight: 103 kg 102.8 kg    Examination: General:  critically ill appearing on mech vent HEENT: MM pink/moist; ETT in place Neuro:  sedated; per nurse had coughing fit when trying to wean off sedation; PERRL; cough/gag present CV: s1s2, RRR, no m/r/g PULM:  dim clear BS bilaterally; on mech vent PRVC GI: soft, bsx4 active  Extremities: warm/dry, no edema  Skin: no rashes or lesions appreciated  Resolved Hospital Problem list   N/A  Assessment & Plan:  Acute hypoxemic respiratory failure secondary to sepsis P: -mental status precluding extubation -SBT today; rest on prvc tonight -Wean PEEP/FiO2 for SpO2 >92% -VAP bundle in place -Daily SAT and SBT -PAD protocol in place -wean sedation for RASS goal 0 to -1 -Follow intermittent CXR and ABG PRN  Severe septic shock secondary to UTI versus aspiration, HCAP E. coli bacteremia P: -wean levo for map >65 -consider adding midodrine if remains on levo -switch rocephin to ancef -trend wbc/fever curve  Acute renal failure secondary to sepsis, ATN Urinary retention Renal ultrasound with no hydronephrosis. P: -unable to give home finasteride and flomax in tube -Trend BMP / urinary output -Replace electrolytes as indicated -Avoid nephrotoxic agents, ensure adequate renal perfusion  Acute metabolic encephalopathy secondary to sepsis Baseline dementia: baseline? -Workup including MRI has been negative. P: -wean sedation for RASS 0 to -1 -limit sedating meds -resume home seroquel to help wean off sedation  DMT2 P: -add TF coverage -continue ssi and cbg monitoring -continue semglee  Elevated liver enzymes Likely secondary to shock, sepsis P: -trending down -continue to trend cmp  Hypophosphatemia P: -give phosph today  Hx of HTN and HLD P: -hold  anti-hypertensive meds -resume ASA and statin  Anemia Thrombocytopenia P: -Trend cbc  GOC Ward of state Prognosis is guarded P: -will attempt to update family later today   Best Practice (right click and "Reselect all SmartList Selections" daily)   Diet/type: tubefeeds DVT prophylaxis:  prophylactic heparin  GI prophylaxis: H2B Lines: Central line Foley:  Yes, and it is still needed Code Status:  full code Last date of multidisciplinary goals of care discussion [11/27 attempted to call son Remi Deter over phone; unable to reach]  Critical care time: 35 minutes   JD Anselm Lis [place] [facility] 08/15/2022, 9:14 AM  Please see [email or web address] for pager details.  From 7A-7P if no response, please call [phone]. After hours, please call ELink [phone].

## 2022-08-15 NOTE — Progress Notes (Signed)
Patient pressor requirements increasing over last hour, despite decreasing sedation. No other changes in patient status. Notified Dr. Denese Killings with orders for 1000 cc bolus of normal saline.

## 2022-08-16 LAB — GLUCOSE, CAPILLARY
Glucose-Capillary: 228 mg/dL — ABNORMAL HIGH (ref 70–99)
Glucose-Capillary: 224 mg/dL — ABNORMAL HIGH (ref 70–99)
Glucose-Capillary: 238 mg/dL — ABNORMAL HIGH (ref 70–99)
Glucose-Capillary: 232 mg/dL — ABNORMAL HIGH (ref 70–99)
Glucose-Capillary: 182 mg/dL — ABNORMAL HIGH (ref 70–99)
Glucose-Capillary: 226 mg/dL — ABNORMAL HIGH (ref 70–99)

## 2022-08-16 LAB — COMPREHENSIVE METABOLIC PANEL
ALT: 175 U/L — ABNORMAL HIGH (ref 0–44)
AST: 85 U/L — ABNORMAL HIGH (ref 15–41)
Albumin: 1.8 g/dL — ABNORMAL LOW (ref 3.5–5.0)
Alkaline Phosphatase: 239 U/L — ABNORMAL HIGH (ref 38–126)
Anion gap: 9 (ref 5–15)
BUN: 23 mg/dL (ref 8–23)
CO2: 23 mmol/L (ref 22–32)
Calcium: 7.8 mg/dL — ABNORMAL LOW (ref 8.9–10.3)
Chloride: 114 mmol/L — ABNORMAL HIGH (ref 98–111)
Creatinine, Ser: 0.78 mg/dL (ref 0.61–1.24)
GFR, Estimated: >60 mL/min (ref >60)
Glucose, Bld: 285 mg/dL — ABNORMAL HIGH (ref 70–99)
Potassium: 4.4 mmol/L (ref 3.5–5.1)
Sodium: 146 mmol/L — ABNORMAL HIGH (ref 135–145)
Total Bilirubin: 0.4 mg/dL (ref 0.3–1.2)
Total Protein: 5 g/dL — ABNORMAL LOW (ref 6.5–8.1)

## 2022-08-16 LAB — PHOSPHORUS: Phosphorus: 2.1 mg/dL — ABNORMAL LOW (ref 2.5–4.6)

## 2022-08-16 LAB — CBC
HCT: 28.8 % — ABNORMAL LOW (ref 39.0–52.0)
Hemoglobin: 8.9 g/dL — ABNORMAL LOW (ref 13.0–17.0)
MCH: 29.7 pg (ref 26.0–34.0)
MCHC: 30.9 g/dL (ref 30.0–36.0)
MCV: 96 fL (ref 80.0–100.0)
Platelets: 170 10*3/uL (ref 150–400)
RBC: 3 MIL/uL — ABNORMAL LOW (ref 4.22–5.81)
RDW: 16 % — ABNORMAL HIGH (ref 11.5–15.5)
WBC: 24.3 10*3/uL — ABNORMAL HIGH (ref 4.0–10.5)
nRBC: 0.4 % — ABNORMAL HIGH (ref 0.0–0.2)

## 2022-08-16 LAB — MAGNESIUM: Magnesium: 2 mg/dL (ref 1.7–2.4)

## 2022-08-16 MED ORDER — INSULIN ASPART 100 UNIT/ML IJ SOLN
6.0000 [IU] | INTRAMUSCULAR | Status: DC
  Administered 2022-08-16 – 2022-08-17 (×6): 6 [IU] via SUBCUTANEOUS

## 2022-08-16 MED ORDER — SODIUM PHOSPHATES 45 MMOLE/15ML IV SOLN
15.0000 mmol | Freq: Once | INTRAVENOUS | Status: AC
  Administered 2022-08-16: 15 mmol via INTRAVENOUS
  Filled 2022-08-16: qty 5

## 2022-08-16 MED ORDER — MIDODRINE HCL 5 MG PO TABS
10.0000 mg | ORAL_TABLET | Freq: Three times a day (TID) | ORAL | Status: DC
  Administered 2022-08-16 – 2022-08-18 (×6): 10 mg
  Filled 2022-08-16 (×7): qty 2

## 2022-08-16 MED ORDER — BETHANECHOL CHLORIDE 10 MG PO TABS
10.0000 mg | ORAL_TABLET | Freq: Three times a day (TID) | ORAL | Status: AC
  Administered 2022-08-16 – 2022-08-18 (×9): 10 mg
  Filled 2022-08-16 (×9): qty 1

## 2022-08-16 MED ORDER — FREE WATER
100.0000 mL | Status: DC
  Administered 2022-08-16 – 2022-08-26 (×59): 100 mL

## 2022-08-16 MED ORDER — INSULIN GLARGINE-YFGN 100 UNIT/ML ~~LOC~~ SOLN
10.0000 [IU] | Freq: Every day | SUBCUTANEOUS | Status: DC
  Administered 2022-08-16: 10 [IU] via SUBCUTANEOUS
  Filled 2022-08-16 (×2): qty 0.1

## 2022-08-16 NOTE — Progress Notes (Signed)
[facility] ADULT ICU REPLACEMENT PROTOCOL   The patient does apply for the [facility] Adult ICU Electrolyte Replacment Protocol based on the criteria listed below:   1.Exclusion criteria: TCTS, ECMO, Dialysis, and Myasthenia Gravis patients 2. Is GFR >/= 30 ml/min? Yes.    Patient's GFR today is >60 3. Is SCr </= 2? Yes.   Patient's SCr is 0.78 mg/dL 4. Did SCr increase >/= 0.5 in 24 hours? No. 5.Pt's weight >40kg  Yes.   6. Abnormal electrolyte(s): Phos 2.1  7. Electrolytes replaced per protocol 8.  Call MD STAT for K+ </= 2.5, Phos </= 1, or Mag </= 1 Physician:  Dr. Patrecia Pace, Loni Beckwith 08/16/2022 6:05 AM

## 2022-08-16 NOTE — Progress Notes (Signed)
RN called RT stating ventilator alarming low RR. With instruction from RT, RN was able to place pt back on full ventilatory support with previous settings. Pt tolerating well at this time.

## 2022-08-16 NOTE — TOC Progression Note (Signed)
Transition of Care [facility]) - Progression Note    Patient Details  Name: Charles Fox MRN: [id number] Date of Birth: 01-Aug-1951  [facility] CM/SW Contact  Mearl Latin, LCSW Phone Number: 08/16/2022, 11:51 AM  Clinical Narrative:    Received call from Redmond Pulling stating she is with DSS and she is legal guardian for patient [phone]). CSW provided update and provided her info to the medical team.          Expected Discharge Plan and Services                                                 Social Determinants of Health (SDOH) Interventions    Readmission Risk Interventions     No data to display

## 2022-08-16 NOTE — Progress Notes (Signed)
SLP Cancellation Note  Patient Details Name: Charles Fox MRN: [id number] DOB: 1951-08-27   Cancelled treatment:       Reason Eval/Treat Not Completed: Other (comment). Pt remains unready for SLP assessment. Will sign off at this time.    Tyrina Hines, Riley Nearing 08/16/2022, 8:25 AM

## 2022-08-16 NOTE — Progress Notes (Signed)
Social worker was able to locate Charles Fox's information. Number is [phone]. Spoke with Legal guardian Hansel Starling over phone and updated on patients plan of care. Let her know that she is free to come visit at bedside during visiting hours. Will continue to update her daily.  JD Anselm Lis [place] [facility] 08/16/2022, 11:59 AM  Please see [email or web address] for pager details.  From 7A-7P if no response, please call [phone]. After hours, please call ELink [phone].

## 2022-08-16 NOTE — Progress Notes (Signed)
Pt received intermittent catheterization x2 per urinary retention protocol during shift. Bladder scan at 0000 showed 645 mL in bladder after not urinating since indwelling cath was removed.  Six hours after midnight in-and-out cath, bladder scan revealed 705 mL in bladder.  Intermittent catheterization performed again at 0615.  Will inform day shift nurse of persistent urinary retention.

## 2022-08-16 NOTE — Progress Notes (Signed)
NAME:  Charles Fox, MRN:  [id number], DOB:  1951-04-25, LOS: 8 ADMISSION DATE:  08/08/2022, CONSULTATION DATE:  08/11/22 REFERRING MD:  Aundria Rud, CHIEF COMPLAINT:  confusion   History of Present Illness:  71 year old man hx of DM2, HTN, HLD, CVA, dementia resides in SNF ward of the state presenting with AMS questionable fall and seizure.  Nasal fx on imaging.  Neuro workup neg (MRI/EEG), question raised of progressive dementia or hypertensive encephalopathy.  Worsening lethargy starting yesterday and progressive through day now more hypotensive, rising WBC, near obtundation, SOB prompting ICU eval.  Bladder scan with urinary retention CXR question evolving R infiltrate  Hx per chart review due to patient condition.  Pertinent  Medical History  Dementia, DM, HTN   Significant Hospital Events: Including procedures, antibiotic start and stop dates in addition to other pertinent events   08/08/22 admit 11/22 worsening mental status 11/23 ICU consult, intubated, started on pressors for profound shock with lactic acidosis. 11/24 Pressor requirements improved  Interim History / Subjective:  Levo requirements increased to 12 from 5 yesterday On 0.4 precedex; still lethargic but able to open eyes to voice and follows intermittent commands; very weak; PERRL  Objective   Blood pressure (!) 113/44, pulse 66, temperature (!) 97.3 F (36.3 C), resp. rate (!) 0, height 6' (1.829 m), weight 102.8 kg, SpO2 100 %.    Vent Mode: PRVC FiO2 (%):  [40 %] 40 % Set Rate:  [16 bmp] 16 bmp Vt Set:  [550 mL] 550 mL PEEP:  [5 cmH20] 5 cmH20 Pressure Support:  [5 cmH20] 5 cmH20 Plateau Pressure:  [12 cmH20] 12 cmH20   Intake/Output Summary (Last 24 hours) at 08/16/2022 4098 Last data filed at 08/16/2022 1191 Gross per 24 hour  Intake 2931.63 ml  Output 2365 ml  Net 566.63 ml    Filed Weights   08/08/22 0800 08/09/22 0207  Weight: 103 kg 102.8 kg    Examination: General:  critically ill  appearing on mech vent HEENT: MM pink/moist; ETT in place Neuro: sedated with 0.4 precedex; still lethargic but able to open eyes to voice and follows intermittent commands; very weak; PERRL CV: s1s2, RRR, no m/r/g PULM:  dim clear BS bilaterally; on mech vent PRVC GI: soft, bsx4 active  Extremities: warm/dry, no edema  Skin: no rashes or lesions appreciated  Resolved Hospital Problem list   N/A  Assessment & Plan:  Acute hypoxemic respiratory failure secondary to sepsis P: -doing well on SBT but mental status precluding extubation -wean off precedex for RASS 0 to -1 -Wean PEEP/FiO2 for SpO2 >92% -VAP bundle in place -Daily SAT and SBT -PAD protocol in place  Severe septic shock secondary to UTI versus aspiration, HCAP E. coli bacteremia P: -wean levo for map >65 -will add midodrine -continue ancef -trend wbc/fever curve  Acute renal failure secondary to sepsis, ATN Urinary retention Renal ultrasound with no hydronephrosis. P: -unable to give home finasteride and flomax in tube -Trend BMP / urinary output -Replace electrolytes as indicated -Avoid nephrotoxic agents, ensure adequate renal perfusion  Acute metabolic encephalopathy secondary to sepsis Baseline dementia: baseline? -Workup including MRI has been negative. P: -wean sedation for RASS 0 to -1 -limit sedating meds -resume home seroquel and adding oxy per tube to help come sedation  DMT2 P: -increase TF coverage and semglee -continue ssi and cbg monitoring  Elevated liver enzymes Likely secondary to shock, sepsis P: -trending down -continue to trend cmp  Hypophosphatemia P: -repleting na/phosph this am -  trend phosph  Hx of HTN and HLD P: -hold anti-hypertensive meds -resume ASA and statin  Anemia Thrombocytopenia P: -Trend cbc  GOC Ward of state Prognosis is guarded P: -will attempt to update family later today   Best Practice (right click and "Reselect all SmartList Selections"  daily)   Diet/type: tubefeeds DVT prophylaxis: prophylactic heparin  GI prophylaxis: H2B Lines: Central line Foley:  Yes, and it is still needed Code Status:  full code Last date of multidisciplinary goals of care discussion [11/28 attempted to call son Remi Deter over phone; unable to reach]  Critical care time: 35 minutes   JD Anselm Lis [facility] 08/16/2022, 7:14 AM  Please see [email or web address] for pager details.  From 7A-7P if no response, please call [phone]. After hours, please call ELink [phone].

## 2022-08-16 NOTE — Progress Notes (Signed)
RT placed pt in CPAP/PS 8/5. Pt tolerating well with SVS at this time.

## 2022-08-16 NOTE — Progress Notes (Signed)
eLink Physician-Brief Progress Note Patient Name: Charles Fox DOB: 03/11/1951 MRN: [id number]   Date of Service  08/16/2022  HPI/Events of Note  Pt with due midodrine, BP 143/74, HR 99  eICU Interventions  Will hold due PM dose of midodrine.         Leanette Eutsler M DELA CRUZ 08/16/2022, 10:48 PM

## 2022-08-17 ENCOUNTER — Inpatient Hospital Stay (HOSPITAL_COMMUNITY): Payer: Medicare Other

## 2022-08-17 DIAGNOSIS — J9600 Acute respiratory failure, unspecified whether with hypoxia or hypercapnia: Secondary | ICD-10-CM

## 2022-08-17 LAB — CBC
HCT: 26.3 % — ABNORMAL LOW (ref 39.0–52.0)
Hemoglobin: 8.5 g/dL — ABNORMAL LOW (ref 13.0–17.0)
MCH: 30.8 pg (ref 26.0–34.0)
MCHC: 32.3 g/dL (ref 30.0–36.0)
MCV: 95.3 fL (ref 80.0–100.0)
Platelets: 205 10*3/uL (ref 150–400)
RBC: 2.76 MIL/uL — ABNORMAL LOW (ref 4.22–5.81)
RDW: 16.2 % — ABNORMAL HIGH (ref 11.5–15.5)
WBC: 20.8 10*3/uL — ABNORMAL HIGH (ref 4.0–10.5)
nRBC: 0.2 % (ref 0.0–0.2)

## 2022-08-17 LAB — COMPREHENSIVE METABOLIC PANEL
ALT: 140 U/L — ABNORMAL HIGH (ref 0–44)
AST: 118 U/L — ABNORMAL HIGH (ref 15–41)
Albumin: 1.5 g/dL — ABNORMAL LOW (ref 3.5–5.0)
Alkaline Phosphatase: 287 U/L — ABNORMAL HIGH (ref 38–126)
Anion gap: 7 (ref 5–15)
BUN: 16 mg/dL (ref 8–23)
CO2: 26 mmol/L (ref 22–32)
Calcium: 7.5 mg/dL — ABNORMAL LOW (ref 8.9–10.3)
Chloride: 111 mmol/L (ref 98–111)
Creatinine, Ser: 0.7 mg/dL (ref 0.61–1.24)
GFR, Estimated: >60 mL/min (ref >60)
Glucose, Bld: 225 mg/dL — ABNORMAL HIGH (ref 70–99)
Potassium: 4.6 mmol/L (ref 3.5–5.1)
Sodium: 144 mmol/L (ref 135–145)
Total Bilirubin: 0.7 mg/dL (ref 0.3–1.2)
Total Protein: 4.6 g/dL — ABNORMAL LOW (ref 6.5–8.1)

## 2022-08-17 LAB — GLUCOSE, CAPILLARY
Glucose-Capillary: 201 mg/dL — ABNORMAL HIGH (ref 70–99)
Glucose-Capillary: 186 mg/dL — ABNORMAL HIGH (ref 70–99)
Glucose-Capillary: 211 mg/dL — ABNORMAL HIGH (ref 70–99)
Glucose-Capillary: 145 mg/dL — ABNORMAL HIGH (ref 70–99)
Glucose-Capillary: 161 mg/dL — ABNORMAL HIGH (ref 70–99)
Glucose-Capillary: 148 mg/dL — ABNORMAL HIGH (ref 70–99)

## 2022-08-17 LAB — PHOSPHORUS: Phosphorus: 1.9 mg/dL — ABNORMAL LOW (ref 2.5–4.6)

## 2022-08-17 MED ORDER — SODIUM PHOSPHATES 45 MMOLE/15ML IV SOLN
15.0000 mmol | Freq: Once | INTRAVENOUS | Status: AC
  Administered 2022-08-17: 15 mmol via INTRAVENOUS
  Filled 2022-08-17: qty 5

## 2022-08-17 MED ORDER — POTASSIUM & SODIUM PHOSPHATES 280-160-250 MG PO PACK
1.0000 | PACK | Freq: Three times a day (TID) | ORAL | Status: AC
  Administered 2022-08-17 (×3): 1
  Filled 2022-08-17 (×3): qty 1

## 2022-08-17 MED ORDER — INSULIN GLARGINE-YFGN 100 UNIT/ML ~~LOC~~ SOLN
10.0000 [IU] | Freq: Two times a day (BID) | SUBCUTANEOUS | Status: DC
  Administered 2022-08-17 – 2022-08-26 (×18): 10 [IU] via SUBCUTANEOUS
  Filled 2022-08-17 (×20): qty 0.1

## 2022-08-17 MED ORDER — QUETIAPINE FUMARATE 50 MG PO TABS
75.0000 mg | ORAL_TABLET | Freq: Every day | ORAL | Status: DC
  Administered 2022-08-17 – 2022-08-24 (×8): 75 mg
  Filled 2022-08-17: qty 1
  Filled 2022-08-17: qty 3
  Filled 2022-08-17 (×3): qty 1
  Filled 2022-08-17: qty 3
  Filled 2022-08-17 (×2): qty 1

## 2022-08-17 MED ORDER — FUROSEMIDE 10 MG/ML IJ SOLN
40.0000 mg | Freq: Once | INTRAMUSCULAR | Status: AC
  Administered 2022-08-17: 40 mg via INTRAVENOUS
  Filled 2022-08-17: qty 4

## 2022-08-17 MED ORDER — INSULIN ASPART 100 UNIT/ML IJ SOLN
8.0000 [IU] | INTRAMUSCULAR | Status: DC
  Administered 2022-08-17 – 2022-08-26 (×45): 8 [IU] via SUBCUTANEOUS

## 2022-08-17 MED ORDER — INSULIN GLARGINE-YFGN 100 UNIT/ML ~~LOC~~ SOLN
12.0000 [IU] | Freq: Every day | SUBCUTANEOUS | Status: DC
  Administered 2022-08-17: 12 [IU] via SUBCUTANEOUS
  Filled 2022-08-17: qty 0.12

## 2022-08-17 MED ORDER — OXYCODONE HCL 5 MG PO TABS
5.0000 mg | ORAL_TABLET | Freq: Four times a day (QID) | ORAL | Status: DC
  Administered 2022-08-17 – 2022-08-18 (×4): 5 mg
  Filled 2022-08-17 (×4): qty 1

## 2022-08-17 NOTE — Progress Notes (Signed)
OT Cancellation Note  Patient Details Name: Charles Fox MRN: [id number] DOB: [birth year]   Cancelled Treatment:    Reason Eval/Treat Not Completed: Patient's level of consciousness.  Pt still on the vent with less sedation but still with decreased ability to attend, elevated HR at times, and increased agitation.  Will hold off on OT today and check back tomorrow for greater chance of active participation.    Ty Oshima OTR/L 08/17/2022, 1:55 PM

## 2022-08-17 NOTE — Progress Notes (Signed)
NAME:  Charles Fox, MRN:  [id number], DOB:  Jan 21, 1951, LOS: 9 ADMISSION DATE:  08/08/2022, CONSULTATION DATE:  08/11/22 REFERRING MD:  Aundria Rud, CHIEF COMPLAINT:  confusion   History of Present Illness:  71 year old man hx of DM2, HTN, HLD, CVA, dementia resides in SNF ward of the state presenting with AMS questionable fall and seizure.  Nasal fx on imaging.  Neuro workup neg (MRI/EEG), question raised of progressive dementia or hypertensive encephalopathy.  Worsening lethargy starting yesterday and progressive through day now more hypotensive, rising WBC, near obtundation, SOB prompting ICU eval.  Bladder scan with urinary retention CXR question evolving R infiltrate  Hx per chart review due to patient condition.  Pertinent  Medical History  Dementia, DM, HTN   Significant Hospital Events: Including procedures, antibiotic start and stop dates in addition to other pertinent events   08/08/22 admit 11/22 worsening mental status 11/23 ICU consult, intubated, started on pressors for profound shock with lactic acidosis. 11/24 Pressor requirements improved  Interim History / Subjective:  On 0.3 precedex. Follows some commands. Still lethargic. Having to restart levo this am.   Objective   Blood pressure (!) 102/44, pulse 77, temperature 97.9 F (36.6 C), resp. rate 17, height 6' (1.829 m), weight 117.6 kg, SpO2 100 %.    Vent Mode: PRVC FiO2 (%):  [40 %] 40 % Set Rate:  [16 bmp] 16 bmp Vt Set:  [550 mL] 550 mL PEEP:  [5 cmH20] 5 cmH20 Pressure Support:  [8 cmH20] 8 cmH20 Plateau Pressure:  [15 cmH20-19 cmH20] 19 cmH20   Intake/Output Summary (Last 24 hours) at 08/17/2022 0715 Last data filed at 08/17/2022 0700 Gross per 24 hour  Intake 2096.63 ml  Output 2205 ml  Net -108.37 ml    Filed Weights   08/09/22 0207 08/16/22 2100 08/17/22 0348  Weight: 102.8 kg 117.6 kg 117.6 kg    Examination: General:  critically ill appearing on mech vent HEENT: MM pink/moist; ETT in  place Neuro: sedated with 0.3 precedex; still lethargic and weak but able to open eyes to voice and follows intermittent commands; PERRL CV: s1s2, RRR, no m/r/g PULM:  dim clear BS bilaterally; on mech vent PRVC GI: soft, bsx4 active  Extremities: warm/dry, no edema  Skin: no rashes or lesions appreciated  Resolved Hospital Problem list   N/A  Assessment & Plan:  Acute hypoxemic respiratory failure secondary to sepsis P: -SBT today; wean sedation; consider extubation today -wean off precedex for RASS 0 to -1 -Wean PEEP/FiO2 for SpO2 >92% -VAP bundle in place -Daily SAT and SBT -PAD protocol in place  Severe septic shock secondary to UTI versus aspiration, HCAP E. coli bacteremia P: -wean levo for map >65 -continue midodrine -continue ancef -trend wbc/fever curve  Acute renal failure secondary to sepsis, ATN Urinary retention Renal ultrasound with no hydronephrosis. P: -unable to give home finasteride and flomax in tube; giving urecholine -Trend BMP / urinary output -Replace electrolytes as indicated -Avoid nephrotoxic agents, ensure adequate renal perfusion  Acute metabolic encephalopathy secondary to sepsis Baseline dementia: baseline? -Workup including MRI has been negative. P: -wean sedation for RASS 0 to -1 -limit sedating meds -continue seroquel and oxy per tube   DMT2 P: -increase TF coverage and semglee -continue ssi and cbg monitoring  Elevated liver enzymes Likely secondary to shock, sepsis P: -continue to trend cmp -consider RUQ [place] if remains elevated  Hypophosphatemia P: -replete na/phosph this am -trend phosph  Hx of HTN and HLD P: -hold anti-hypertensive  meds -resume ASA and statin  Anemia Thrombocytopenia P: -Trend cbc  GOC Ward of state Prognosis is guarded P: -will continue to update care taker and family   Best Practice (right click and "Reselect all SmartList Selections" daily)   Diet/type: tubefeeds DVT prophylaxis:  prophylactic heparin  GI prophylaxis: H2B Lines: Central line Foley:  Yes, and it is still needed Code Status:  full code Last date of multidisciplinary goals of care discussion [11/29 called Adrienne and updated over phone]  Critical care time: 35 minutes   JD Anselm Lis [place] [facility] 08/17/2022, 7:15 AM  Please see [email or web address] for pager details.  From 7A-7P if no response, please call [phone]. After hours, please call ELink [phone].

## 2022-08-17 NOTE — Procedures (Signed)
Cortrak  Person Inserting Tube:  Osa Craver, RD Tube Type:  Cortrak - 43 inches Tube Size:  10 Tube Location:  Left nare Secured by: Bridle Technique Used to Measure Tube Placement:  Marking at nare/corner of mouth Cortrak Secured At:  69 cm Procedure Comments:  Cortrak Tube Team Note:  Consult received to place a Cortrak feeding tube. Pt with facial fractures post, ok per MD to place Cortrak and Bridle.   X-ray is required, abdominal x-ray has been ordered by the Cortrak team. Please confirm tube placement before using the Cortrak tube.   If the tube becomes dislodged please keep the tube and contact the Cortrak team at [email or web address] for replacement.  If after hours and replacement cannot be delayed, place a NG tube and confirm placement with an abdominal x-ray.   Romelle Starcher MS, RDN, LDN, CNSC Registered Dietitian 3 Clinical Nutrition RD Pager and On-Call Pager Number Located in [place]

## 2022-08-17 NOTE — Progress Notes (Addendum)
eLink Physician-Brief Progress Note Patient Name: Charles Fox DOB: Jul 12, 1951 MRN: [id number]   Date of Service  08/17/2022  HPI/Events of Note  Nursing confusion regarding Midodrine orders. Midodrine dose scheduled for 10 PM; however, order states Give dose during daylight hours only.   eICU Interventions  Plan: Modify Midodrine to Q 8 hour dosing without daylight hour dosing only.      Intervention Category Major Interventions: Hypotension - evaluation and management  Lamerle Jabs Dennard Nip 08/17/2022, 9:35 PM

## 2022-08-17 NOTE — Progress Notes (Signed)
Nutrition Follow-up  DOCUMENTATION CODES:   Not applicable  INTERVENTION:   Tube feeding via cortrak tube: - Vital 1.5 @ 55 ml/hr (1320 ml/day) - advanced to goal rate this am; phosphorus being replaced  - PROSource TF20 60 ml daily  Tube feeding regimen provides 2060 kcal, 109 grams of protein, and 1008 ml of H2O.   - Continue MVI with minerals daily per tube  - 100 ml free water every 4 hours  Total free water: 1608 ml   NUTRITION DIAGNOSIS:   Inadequate oral intake related to lethargy/confusion as evidenced by meal completion < 50%.  Ongoing, being addressed via initiation of tube feeds  GOAL:   Patient will meet greater than or equal to 90% of their needs  Met with tube feeding at goal   MONITOR:   Vent status, Labs, Weight trends, TF tolerance  REASON FOR ASSESSMENT:   Ventilator, Consult Enteral/tube feeding initiation and management  ASSESSMENT:   Pt with hx of dementia, DM type 2, HTN, HLD, tobacco use, and prior CVA presented to ED after pt fell face forward onto concrete.  Pt discussed during ICU rounds and with RN.  Pt off sedation; per MD mental status will dictate extubation. Pt had cortrak tube placed today in anticipation of extubation soon.   11/20 - admitted; on dysphagia 2 diet 11/22 - NPO 11/23 - transferred to ICU, intubated, started on pressors for profound shock secondary to PNA/UTI vs asp/HCAP 11/29 - cortrak placed; per xray tube tip is in the distal stomach  Medications reviewed and include: colace, pepcid, SSI q 4 hours, 8 units novolog every 4 hours, 10 units semglee BID, MVI with minerals, miralax, K/Na pohs TID d/c 11/30 [place] x 1   Labs reviewed: PO4: 1.8 -> 2.4 -> 1.7 -> 2.1 -> 1.9 CBG's: 182-238  UOP: 2205 ml x 24 hours I/O's: +11.4 L since admit Moderate edema  Diet Order:   Diet Order             Diet NPO time specified  Diet effective now                   EDUCATION NEEDS:   Not appropriate for education  at this time  Skin:  Skin Assessment:  (Laceration upper lip, skin tear L buttocks)  Last BM:  11/28  Height:   Ht Readings from Last 1 Encounters:  08/09/22 6' (1.829 m)    Weight:   Wt Readings from Last 1 Encounters:  08/17/22 117.6 kg    Ideal Body Weight:  80.9 kg  BMI:  Body mass index is 35.16 kg/m.  Estimated Nutritional Needs:   Kcal:  2000-2200 kcal/d  Protein:  100-115 g/d  Fluid:  2-2.2L/d  Lockie Pares., RD, LDN, CNSC See AMiON for contact information

## 2022-08-18 DIAGNOSIS — J9601 Acute respiratory failure with hypoxia: Secondary | ICD-10-CM

## 2022-08-18 DIAGNOSIS — B9620 Unspecified Escherichia coli [E. coli] as the cause of diseases classified elsewhere: Secondary | ICD-10-CM

## 2022-08-18 DIAGNOSIS — R7881 Bacteremia: Secondary | ICD-10-CM

## 2022-08-18 LAB — CBC
HCT: 27.5 % — ABNORMAL LOW (ref 39.0–52.0)
Hemoglobin: 8.5 g/dL — ABNORMAL LOW (ref 13.0–17.0)
MCH: 29.6 pg (ref 26.0–34.0)
MCHC: 30.9 g/dL (ref 30.0–36.0)
MCV: 95.8 fL (ref 80.0–100.0)
Platelets: 260 10*3/uL (ref 150–400)
RBC: 2.87 MIL/uL — ABNORMAL LOW (ref 4.22–5.81)
RDW: 16.3 % — ABNORMAL HIGH (ref 11.5–15.5)
WBC: 19 10*3/uL — ABNORMAL HIGH (ref 4.0–10.5)
nRBC: 0.2 % (ref 0.0–0.2)

## 2022-08-18 LAB — COMPREHENSIVE METABOLIC PANEL
ALT: 123 U/L — ABNORMAL HIGH (ref 0–44)
AST: 127 U/L — ABNORMAL HIGH (ref 15–41)
Albumin: 1.6 g/dL — ABNORMAL LOW (ref 3.5–5.0)
Alkaline Phosphatase: 305 U/L — ABNORMAL HIGH (ref 38–126)
Anion gap: 4 — ABNORMAL LOW (ref 5–15)
BUN: 12 mg/dL (ref 8–23)
CO2: 28 mmol/L (ref 22–32)
Calcium: 7.7 mg/dL — ABNORMAL LOW (ref 8.9–10.3)
Chloride: 113 mmol/L — ABNORMAL HIGH (ref 98–111)
Creatinine, Ser: 0.7 mg/dL (ref 0.61–1.24)
GFR, Estimated: >60 mL/min (ref >60)
Glucose, Bld: 151 mg/dL — ABNORMAL HIGH (ref 70–99)
Potassium: 4.3 mmol/L (ref 3.5–5.1)
Sodium: 145 mmol/L (ref 135–145)
Total Bilirubin: 0.2 mg/dL — ABNORMAL LOW (ref 0.3–1.2)
Total Protein: 5 g/dL — ABNORMAL LOW (ref 6.5–8.1)

## 2022-08-18 LAB — GLUCOSE, CAPILLARY
Glucose-Capillary: 121 mg/dL — ABNORMAL HIGH (ref 70–99)
Glucose-Capillary: 144 mg/dL — ABNORMAL HIGH (ref 70–99)
Glucose-Capillary: 160 mg/dL — ABNORMAL HIGH (ref 70–99)
Glucose-Capillary: 158 mg/dL — ABNORMAL HIGH (ref 70–99)
Glucose-Capillary: 184 mg/dL — ABNORMAL HIGH (ref 70–99)
Glucose-Capillary: 150 mg/dL — ABNORMAL HIGH (ref 70–99)

## 2022-08-18 LAB — PHOSPHORUS: Phosphorus: 2 mg/dL — ABNORMAL LOW (ref 2.5–4.6)

## 2022-08-18 MED ORDER — ORAL CARE MOUTH RINSE: 15.0000 mL | OROMUCOSAL | Status: DC | PRN

## 2022-08-18 MED ORDER — FUROSEMIDE 10 MG/ML IJ SOLN
40.0000 mg | Freq: Once | INTRAMUSCULAR | Status: AC
  Administered 2022-08-18: 40 mg via INTRAVENOUS
  Filled 2022-08-18: qty 4

## 2022-08-18 MED ORDER — CARVEDILOL 6.25 MG PO TABS
6.2500 mg | ORAL_TABLET | Freq: Two times a day (BID) | ORAL | Status: DC
  Administered 2022-08-18 – 2022-08-24 (×13): 6.25 mg
  Filled 2022-08-18: qty 1
  Filled 2022-08-18: qty 2
  Filled 2022-08-18 (×9): qty 1
  Filled 2022-08-18: qty 2
  Filled 2022-08-18 (×3): qty 1

## 2022-08-18 MED ORDER — POTASSIUM PHOSPHATES 15 MMOLE/5ML IV SOLN
20.0000 mmol | Freq: Once | INTRAVENOUS | Status: AC
  Administered 2022-08-18: 20 mmol via INTRAVENOUS
  Filled 2022-08-18: qty 6.67

## 2022-08-18 MED ORDER — ORAL CARE MOUTH RINSE
15.0000 mL | OROMUCOSAL | Status: DC
  Administered 2022-08-19 – 2022-08-29 (×42): 15 mL via OROMUCOSAL

## 2022-08-18 NOTE — Progress Notes (Signed)
Physical Therapy Treatment Patient Details Name: Charles Fox MRN: [id number] DOB: 07/04/1951 Today's Date: 08/18/2022   History of Present Illness [name] is a 71 yo M admittted 11/20 after being found down outside of his group home.  PMH of dementia, T2DM, Htn, HLD, CVA, housing insecurity, and tobacco use disorder.  nasal fx noted on admission with worsening encephalopathy and resipiratory status.  Transferred to ICU intubated 11/23-30/23.  Now with sepsis, UTI with septic Fox, and PNA.    PT Comments    Pt extubated earlier this morning. Pt more awake with report of needed to have BM. Pt with R sided weakness and bias, requires heavy maxAX2-3 for sit to stand, std pvt to [facility] then to chair. Pt with poor command follow and noted confusion vs pt's dementia. Pt remains appropriate for SNF as pt requiring significantly more assist with mobility and ADLs and would benefit from increased time to achieve safe supervision level of function to return to group home. Acute PT to cont to follow.   Recommendations for follow up therapy are one component of a multi-disciplinary discharge planning process, led by the attending physician.  Recommendations may be updated based on patient status, additional functional criteria and insurance authorization.  Follow Up Recommendations  Skilled nursing-short term rehab (<3 hours/day) (TBA once meds clear) Can patient physically be transported by private vehicle: No   Assistance Recommended at Discharge Frequent or constant Supervision/Assistance  Patient can return home with the following Assistance with cooking/housework;Assist for transportation;Help with stairs or ramp for entrance;A lot of help with walking and/or transfers;A lot of help with bathing/dressing/bathroom;Assistance with feeding;Direct supervision/assist for financial management   Equipment Recommendations  Other (comment) (TBD)    Recommendations for Other Services        Precautions / Restrictions Precautions Precautions: Fall Restrictions Weight Bearing Restrictions: No     Mobility  Bed Mobility               General bed mobility comments: Pt EOB with RN staff when PT/OT arrived    Transfers Overall transfer level: Needs assistance Equipment used: 2 person hand held assist (face to face transfer) Transfers: Sit to/from Stand, Bed to chair/wheelchair/BSC Sit to Stand: Max assist, +2 physical assistance, +2 safety/equipment, From elevated surface (ICU bed)     Squat pivot transfers: Max assist, +2 physical assistance, +2 safety/equipment, From elevated surface     General transfer comment: HEAVY max A +2 for lift, heavy multimodal cueing throughout for weight shift, foot placement, all aspects of transfer - Pt with trouble holding his oral secretions so drool present, 2 std pvt total, bed to BSC, BSC to chair, pt with strong R lateral lean and unable to stand upright    Ambulation/Gait               General Gait Details: unable   Stairs             Wheelchair Mobility    Modified Rankin (Stroke Patients Only)       Balance Overall balance assessment: Needs assistance Sitting-balance support: Single extremity supported, Feet supported Sitting balance-Leahy Scale: Zero Sitting balance - Comments: max A for balance EOB Postural control: Right lateral lean Standing balance support: No upper extremity supported Standing balance-Leahy Scale: Zero Standing balance comment: pt dependent to maintain standing for hygiene s/p BM  Cognition Arousal/Alertness: Lethargic Behavior During Therapy: Restless, Flat affect Overall Cognitive Status: History of cognitive impairments - at baseline                                 General Comments: pt with known diagnosis of dementia. Pt reports living with his Dad, R lateral lean with inability to fix, required multimodal cues for  all aspects of transfers, short responses, was aware of the need for BM but didn't understand he could go on the [facility]        Exercises      General Comments General comments (skin integrity, edema, etc.): pt on 2LO2 via [place], dependent for hygiene s/p BM      Pertinent Vitals/Pain Pain Assessment Pain Assessment: Faces Faces Pain Scale: No hurt    Home Living                          Prior Function            PT Goals (current goals can now be found in the care plan section) Acute Rehab PT Goals Patient Stated Goal: unable to state PT Goal Formulation: With patient Time For Goal Achievement: 08/22/22 Potential to Achieve Goals: Fair Progress towards PT goals: Progressing toward goals    Frequency    Min 3X/week      PT Plan Current plan remains appropriate    Co-evaluation PT/OT/SLP Co-Evaluation/Treatment: Yes Reason for Co-Treatment: Complexity of the patient's impairments (multi-system involvement) PT goals addressed during session: Mobility/safety with mobility OT goals addressed during session: ADL's and self-care;Strengthening/ROM      AM-PAC PT "6 Clicks" Mobility   Outcome Measure  Help needed turning from your back to your side while in a flat bed without using bedrails?: Total Help needed moving from lying on your back to sitting on the side of a flat bed without using bedrails?: Total Help needed moving to and from a bed to a chair (including a wheelchair)?: Total Help needed standing up from a chair using your arms (e.g., wheelchair or bedside chair)?: Total Help needed to walk in hospital room?: Total Help needed climbing 3-5 steps with a railing? : Total 6 Click Score: 6    End of Session Equipment Utilized During Treatment: Gait belt Activity Tolerance: Patient limited by lethargy;Treatment limited secondary to medical complications (Comment) Patient left: with call bell/phone within reach;in bed;with bed alarm set;with  nursing/sitter in room (lift pad in chair) Nurse Communication: Mobility status PT Visit Diagnosis: Unsteadiness on feet (R26.81);Muscle weakness (generalized) (M62.81)     Time: 1610-9604 PT Time Calculation (min) (ACUTE ONLY): 25 min  Charges:  $Therapeutic Activity: 8-22 mins                     Charles Fox, PT, DPT Acute Rehabilitation Services Secure chat preferred Office #: [phone]    Iona Hansen 08/18/2022, 12:45 PM

## 2022-08-18 NOTE — Progress Notes (Signed)
Nutrition Follow-up  DOCUMENTATION CODES:   Not applicable  INTERVENTION:   Tube feeding via cortrak tube: - Vital 1.5 @ 55 ml/hr (1320 ml/day;  phosphorus being replaced  - PROSource TF20 60 ml daily  Tube feeding regimen provides 2060 kcal, 109 grams of protein, and 1008 ml of H2O.   - Continue MVI with minerals daily per tube  - 100 ml free water every 4 hours  Total free water: 1608 ml   NUTRITION DIAGNOSIS:   Inadequate oral intake related to lethargy/confusion as evidenced by meal completion < 50%.  Ongoing, being addressed via initiation of tube feeds  GOAL:   Patient will meet greater than or equal to 90% of their needs  Met with tube feeding at goal   MONITOR:   Diet advancement, TF tolerance  REASON FOR ASSESSMENT:   Ventilator, Consult Enteral/tube feeding initiation and management  ASSESSMENT:   Pt with hx of dementia, DM type 2, HTN, HLD, tobacco use, and prior CVA presented to ED after pt fell face forward onto concrete.  Pt discussed during ICU rounds and with RN.  Pt extubated this am, working with therapies. Pt with R sided weakness and bias. Therapy recommends SNF.  SLP to evaluate patient for swallow.   11/20 - admitted; on dysphagia 2 diet 11/22 - NPO 11/23 - transferred to ICU, intubated, started on pressors for profound shock secondary to PNA/UTI vs asp/HCAP 11/29 - cortrak placed; per xray tube tip is in the distal stomach 11/30 - extubated  Medications reviewed and include: colace, pepcid, lasix x 1 dose, SSI q 4 hours, 8 units novolog every 4 hours, 10 units semglee BID, MVI with minerals, miralax  [place] x 1   Labs reviewed: PO4: 1.8 -> 2.4 -> 1.7 -> 2.1 -> 1.9 - > 2.0 CBG's: 121-161   Diet Order:   Diet Order             Diet NPO time specified  Diet effective now                   EDUCATION NEEDS:   Not appropriate for education at this time  Skin:  Skin Assessment:  (Laceration upper lip, skin tear L  buttocks)  Last BM:  11/28  Height:   Ht Readings from Last 1 Encounters:  08/09/22 6' (1.829 m)    Weight:   Wt Readings from Last 1 Encounters:  08/18/22 119.3 kg    Ideal Body Weight:  80.9 kg  BMI:  Body mass index is 35.67 kg/m.  Estimated Nutritional Needs:   Kcal:  2000-2200 kcal/d  Protein:  100-115 g/d  Fluid:  2-2.2L/d  Lockie Pares., RD, LDN, CNSC See AMiON for contact information

## 2022-08-18 NOTE — Progress Notes (Signed)
NAME:  Charles Fox, MRN:  [id number], DOB:  [birth year], LOS: 10 ADMISSION DATE:  08/08/2022, CONSULTATION DATE:  08/11/22 REFERRING MD:  Aundria Rud, CHIEF COMPLAINT:  confusion   History of Present Illness:  71 year old man hx of DM2, HTN, HLD, CVA, dementia [facility] in SNF ward [facility] the state presenting with AMS questionable fall and seizure.  Nasal fx on imaging.  Neuro workup neg (MRI/EEG), question raised of progressive dementia or hypertensive encephalopathy.  Worsening lethargy starting yesterday and progressive through day now more hypotensive, rising WBC, near obtundation, SOB prompting ICU eval.  Bladder scan with urinary retention CXR question evolving R infiltrate  Hx per chart review due to patient condition.  Pertinent  Medical History  Dementia, DM, HTN   Significant Hospital Events: Including procedures, antibiotic start and stop dates in addition to other pertinent events   08/08/22 admit 11/22 worsening mental status 11/23 ICU consult, intubated, started on pressors for profound shock with lactic acidosis. 11/24 Pressor requirements improved 11/29 weaning on vent but variable mentaiton 11/30 extubated  Interim History / Subjective:  Decr white count to 19 Incr LFTs  Weaning on vent    Objective   Blood pressure (!) 159/69, pulse 94, temperature 99 F (37.2 C), temperature source Axillary, resp. rate 18, height 6' (1.829 m), weight 119.3 kg, SpO2 100 %.    Vent Mode: PRVC FiO2 (%):  [40 %] 40 % Set Rate:  [16 bmp] 16 bmp Vt Set:  [550 mL] 550 mL PEEP:  [5 cmH20] 5 cmH20 Pressure Support:  [5 cmH20-10 cmH20] 10 cmH20 Plateau Pressure:  [16 cmH20-18 cmH20] 16 cmH20   Intake/Output Summary (Last 24 hours) at 08/18/2022 0726 Last data filed at 08/18/2022 0700 Gross per 24 hour  Intake 2480.11 ml  Output 2495 ml  Net -14.89 ml   Filed Weights   08/16/22 2100 08/17/22 0348 08/18/22 0354  Weight: 117.6 kg 117.6 kg 119.3 kg    Examination: General:   critically ill obese adult M intubated NAD  HEENT: NCAT pink mm ETT secure with thick tan secretions, cortrak in place   Neuro: Awake alert following commands BUE BLE  CV: rrr s1s2 cap refill brisk  PULM:  CTAb even unlabored on PSV  GI: soft round ndnt  GU: foley, yellow urine. Genital edema  Extremities:  no acute joint deformity no cyanosis or clubbing  Skin: no rashes or lesions appreciated  Resolved Hospital Problem list   N/A  Assessment & Plan:   Acute metabolic encephalopathy superimposed on ?chronic dementia  -Workup including MRI has been negative. Due to septic shock, icu delirium. P: -delirium precaution   Acute hypoxic respiratory failure with hypoxia HCAP, aspiration PNA  P: -extubate  Septic shock with multisystem organ dysfunction (resp failure, liver dysfunction, AKI, improved, encephalopathy) with improved shock due to  UTI, HCAP, e coli bacteremia  P -ancef -trend fever curve, WBC -cont on midodrine -- wean soon with improved hemodynamics.   AKI, improved Urinary retention  - [place] w no hydronephrosis. P: -unable to give home finasteride and flomax in tube; giving urecholine - assess daily for foley dc -- keep 11/30   Transaminitis  Likely secondary to septic shock/  P: -PRN CMP  -consider RUQ [place] if does not equilibrate   DM2 -semglee 10 units BID, SSI q4, EN coverage q4  -adjust as diet advanced, keeping cortrak for now in the immediate extubation period   HTN HLD P: -holding antihypertensives -- will watch his BP after he  is extubated. May need to add antHTN vs dc midodrine -ASA, statin   Anemia ,critical illness  P: -Trend cbc  GOC Prognosis is guarded, ward of state  P: -will continue to update care taker and family -full code   L/T/D -dc ETT 11/30 -get PIVs, then dc CV 11/30 -keep cortrak -keep foley   Best Practice (right click and "Reselect all SmartList Selections" daily)   Diet/type: tubefeeds DVT prophylaxis:  prophylactic heparin  GI prophylaxis: H2B Lines: No longer needed.  Order written to d/c  Foley:  Yes, and it is still needed Code Status:  full code Last date of multidisciplinary goals of care discussion [11/29 called Adrienne and updated over phone]  CRITICAL CARE Performed by: Lanier Clam   Total critical care time: 41 minutes  Critical care time was exclusive of separately billable procedures and treating other patients. Critical care was necessary to treat or prevent imminent or life-threatening deterioration.  Critical care was time spent personally by me on the following activities: development of treatment plan with patient and/or surrogate as well as nursing, discussions with consultants, evaluation of patient's response to treatment, examination of patient, obtaining history from patient or surrogate, ordering and performing treatments and interventions, ordering and review of laboratory studies, ordering and review of radiographic studies, pulse oximetry and re-evaluation of patient's condition.  Tessie Fass MSN, AGACNP-BC [facility] Pulmonary/Critical Care Medicine Amion for pager  08/18/2022, 7:26 AM

## 2022-08-18 NOTE — Progress Notes (Signed)
Occupational Therapy Treatment Patient Details Name: Charles Fox Charles Fox Charles Fox Today's Date: 08/18/2022   History of present illness [name] is a 71 yo M admittted 11/20 after being found down outside of his group home.  PMH of dementia, T2DM, Htn, HLD, CVA, housing insecurity, and tobacco use disorder.  nasal fx noted on admission with worsening encephalopathy and resipiratory status.  Transferred to ICU intubated 11/23-30/23.  Now with sepsis, UTI with septic shock, and PNA.   OT comments  Pt seen in conjunction with PT - max A +2 for squat pivot to [facility] as Pt had informed RN of need for BM. Max multimodal cueing in additional to physical assist for transfer. Pt will need wide BSC for future. Pt with R head turn preference and R lateral lean - RUE generally weaker than L (grossly 4-/5) with decreased fine and gross motor - unable to hold onto therapist arm during transfer. Max A +3 for peri care (RN able to clean from behind while OT/PT assisted with power up and stand). Pt then squat pivot to recliner with heavy max A +2 assist. Pt remains appropriate for SNF post-acute as well as continued OT in the acute setting. Next session if patient has eyes open more - assess vision and continued functional use of RUE/strength/coordination.    Recommendations for follow up therapy are one component of a multi-disciplinary discharge planning process, led by the attending physician.  Recommendations may be updated based on patient status, additional functional criteria and insurance authorization.    Follow Up Recommendations  Skilled nursing-short term rehab (<3 hours/day)     Assistance Recommended at Discharge Frequent or constant Supervision/Assistance  Patient can return home with the following  Direct supervision/assist for medications management;Direct supervision/assist for financial management;Assistance with cooking/housework;Assist for transportation;Help with  stairs or ramp for entrance;Two people to help with walking and/or transfers;Two people to help with bathing/dressing/bathroom   Equipment Recommendations  Other (comment) (TBD)    Recommendations for Other Services      Precautions / Restrictions Precautions Precautions: Fall Restrictions Weight Bearing Restrictions: No       Mobility Bed Mobility               General bed mobility comments: Pt EOB with RN staff when OT arrived    Transfers Overall transfer level: Needs assistance Equipment used: 2 person hand held assist Transfers: Sit to/from Stand, Bed to chair/wheelchair/BSC Sit to Stand: Max assist, +2 physical assistance, +2 safety/equipment, From elevated surface (ICU bed)   Squat pivot transfers: Max assist, +2 physical assistance, +2 safety/equipment, From elevated surface       General transfer comment: HEAVY max A +2 for lift, heavy multimodal cueing throughout for weight shift, foot placement, all aspects of transfer - Pt with trouble holding his oral secretions so drool present     Balance Overall balance assessment: Needs assistance Sitting-balance support: Single extremity supported, Feet supported Sitting balance-Leahy Scale: Zero Sitting balance - Comments: mod A for balance EOB Postural control: Right lateral lean Standing balance support: No upper extremity supported Standing balance-Leahy Scale: Zero                             ADL either performed or assessed with clinical judgement   ADL Overall ADL's : Needs assistance/impaired  Toilet Transfer: Maximal assistance;+2 for physical assistance;+2 for safety/equipment;Squat-pivot;BSC/3in1 (needs wide) Toilet Transfer Details (indicate cue type and reason): strong multimodal cues for each aspect of transfer, HEAVY assist with lift and pivot Toileting- Clothing Manipulation and Hygiene: Total assistance;+2 for physical assistance;+2 for  safety/equipment;Sit to/from stand Toileting - Clothing Manipulation Details (indicate cue type and reason): 3rd person performing peri care from behind with max A +2 for partial stand     Functional mobility during ADLs: Maximal assistance;+2 for physical assistance;+2 for safety/equipment (squat pivot only) General ADL Comments: R lateral lean head tilt, decreased cognition for participation in ADL    Extremity/Trunk Assessment Upper Extremity Assessment Upper Extremity Assessment: RUE deficits/detail;Difficult to assess due to impaired cognition;Generalized weakness RUE Deficits / Details: generally seems weaker and less coordinated than L, grossly 4-/5 RUE Sensation: decreased proprioception RUE Coordination: decreased fine motor;decreased gross motor   Lower Extremity Assessment Lower Extremity Assessment: Defer to PT evaluation        Vision   Vision Assessment?: Vision impaired- to be further tested in functional context   Perception     Praxis      Cognition Arousal/Alertness: Lethargic Behavior During Therapy: Restless, Flat affect Overall Cognitive Status: No family/caregiver present to determine baseline cognitive functioning Area of Impairment: Orientation, Attention, Memory, Following commands, Safety/judgement, Awareness, Problem solving                 Orientation Level: Disoriented to, Situation, Place, Time Current Attention Level: Focused Memory: Decreased short-term memory Following Commands: Follows one step commands with increased time Safety/Judgement: Decreased awareness of safety, Decreased awareness of deficits Awareness: Intellectual Problem Solving: Slow processing, Decreased initiation, Requires verbal cues, Difficulty sequencing, Requires tactile cues General Comments: Pt thought he lived with his Dad, R lateral lean with inability to fix, required multimodal cues for all aspects of transfers, ADL, short responses, was aware of the need for  BM        Exercises      Shoulder Instructions       General Comments required 2LO2 to maintain SpO2 >90%    Pertinent Vitals/ Pain       Pain Assessment Pain Assessment: Faces Faces Pain Scale: No hurt Pain Intervention(s): Monitored during session, Repositioned  Home Living                                          Prior Functioning/Environment              Frequency  Min 2X/week        Progress Toward Goals  OT Goals(current goals can now be found in the care plan section)  Progress towards OT goals: Progressing toward goals  Acute Rehab OT Goals Patient Stated Goal: none stated Time For Goal Achievement: 08/29/22 Potential to Achieve Goals: Fair  Plan Discharge plan remains appropriate    Co-evaluation    PT/OT/SLP Co-Evaluation/Treatment: Yes Reason for Co-Treatment: Complexity of the patient's impairments (multi-system involvement);Necessary to address cognition/behavior during functional activity;To address functional/ADL transfers;For patient/therapist safety PT goals addressed during session: Mobility/safety with mobility;Balance;Strengthening/ROM OT goals addressed during session: ADL's and self-care;Strengthening/ROM      AM-PAC OT "6 Clicks" Daily Activity     Outcome Measure   Help from another person eating meals?: Total Help from another person taking care of personal grooming?: Total Help from another person toileting, which includes using toliet, bedpan, or urinal?: Total  Help from another person bathing (including washing, rinsing, drying)?: Total Help from another person to put on and taking off regular upper body clothing?: Total Help from another person to put on and taking off regular lower body clothing?: Total 6 Click Score: 6    End of Session Equipment Utilized During Treatment: Gait belt;Oxygen (2L)  OT Visit Diagnosis: Unsteadiness on feet (R26.81);History of falling (Z91.81);Other symptoms and signs  involving cognitive function;Muscle weakness (generalized) (M62.81)   Activity Tolerance Patient tolerated treatment well;Patient limited by lethargy   Patient Left in chair;with call bell/phone within reach;with chair alarm set;Other (comment) (lift pad under patient)   Nurse Communication Mobility status        Time: 1610-9604 OT Time Calculation (min): 23 min  Charges: OT General Charges $OT Visit: 1 Visit OT Treatments $Self Care/Home Management : 8-22 mins Nyoka Cowden OTR/L Acute Rehabilitation Services Office: [phone]  Evern Bio [facility] 08/18/2022, 11:57 AM

## 2022-08-18 NOTE — Procedures (Signed)
Extubation Procedure Note  Patient Details:   Name: Charles Fox DOB: 02/18/1951 MRN: [id number]   Airway Documentation:    Vent end date: 08/18/22 Vent end time: 0956   Evaluation  O2 sats: stable throughout Complications: No apparent complications Patient did tolerate procedure well.    Patient extubated per MD order, placed on 3L [place]. Cuff leak positive. Patient able to vocalize post extubation. CCM at bedside.  Clent Ridges 08/18/2022, 9:57 AM

## 2022-08-19 DIAGNOSIS — A4151 Sepsis due to Escherichia coli [E. coli]: Secondary | ICD-10-CM

## 2022-08-19 DIAGNOSIS — R6521 Severe sepsis with septic shock: Secondary | ICD-10-CM

## 2022-08-19 LAB — BASIC METABOLIC PANEL
Anion gap: 14 (ref 5–15)
BUN: 8 mg/dL (ref 8–23)
CO2: 27 mmol/L (ref 22–32)
Calcium: 8.1 mg/dL — ABNORMAL LOW (ref 8.9–10.3)
Chloride: 99 mmol/L (ref 98–111)
Creatinine, Ser: 0.55 mg/dL — ABNORMAL LOW (ref 0.61–1.24)
GFR, Estimated: >60 mL/min (ref >60)
Glucose, Bld: 146 mg/dL — ABNORMAL HIGH (ref 70–99)
Potassium: 4.3 mmol/L (ref 3.5–5.1)
Sodium: 140 mmol/L (ref 135–145)

## 2022-08-19 LAB — GLUCOSE, CAPILLARY
Glucose-Capillary: 111 mg/dL — ABNORMAL HIGH (ref 70–99)
Glucose-Capillary: 122 mg/dL — ABNORMAL HIGH (ref 70–99)
Glucose-Capillary: 133 mg/dL — ABNORMAL HIGH (ref 70–99)
Glucose-Capillary: 145 mg/dL — ABNORMAL HIGH (ref 70–99)
Glucose-Capillary: 159 mg/dL — ABNORMAL HIGH (ref 70–99)
Glucose-Capillary: 88 mg/dL (ref 70–99)

## 2022-08-19 LAB — PHOSPHORUS: Phosphorus: 1.9 mg/dL — ABNORMAL LOW (ref 2.5–4.6)

## 2022-08-19 MED ORDER — SODIUM PHOSPHATES 45 MMOLE/15ML IV SOLN
30.0000 mmol | Freq: Once | INTRAVENOUS | Status: AC
  Administered 2022-08-19: 30 mmol via INTRAVENOUS
  Filled 2022-08-19: qty 10

## 2022-08-19 MED ORDER — HYDRALAZINE HCL 20 MG/ML IJ SOLN
10.0000 mg | INTRAMUSCULAR | Status: DC | PRN
  Administered 2022-08-19 – 2022-08-20 (×3): 10 mg via INTRAVENOUS
  Filled 2022-08-19 (×3): qty 1

## 2022-08-19 MED ORDER — K PHOS MONO-SOD PHOS DI & MONO 155-852-130 MG PO TABS
500.0000 mg | ORAL_TABLET | Freq: Two times a day (BID) | ORAL | Status: AC
  Administered 2022-08-19 (×2): 500 mg
  Filled 2022-08-19 (×3): qty 2

## 2022-08-19 NOTE — Evaluation (Signed)
Clinical/Bedside Swallow Evaluation Patient Details  Name: Charles Fox MRN: [id number] Date of Birth: 1951/04/10  Today's Date: 08/19/2022 Time: SLP Start Time (ACUTE ONLY): 1042 SLP Stop Time (ACUTE ONLY): 1056 SLP Time Calculation (min) (ACUTE ONLY): 14 min  Past Medical History: History reviewed. No pertinent past medical history. Past Surgical History: History reviewed. No pertinent surgical history. HPI:  Pt is a 71 yo M admittted 11/20 after being found down outside of his group home. Pt found to have mild nasal fracture MRI brain 11/20 negative for acute changes. Pt seen by SLP 11/21 and 11/22; a dysphagia 2 diet with thin liquids initiated on 11/22. Pt developed progressively worsening lethargy, increasing hypotension, rising WBC, near obtundation, and SOB and was transferred to the ICU 11/23; ETT 11/23-11/30. Pt found to have sepsis, UTI with septic shock, and PNA. PMH: dementia, T2DM, Htn, HLD, CVA, housing insecurity, and tobacco use disorder. BSE 05/09/21: regular texture diet and thin liquids recommended without need for follow up.    Assessment / Plan / Recommendation  Clinical Impression  Pt was seen for bedside swallow evaluation. He was lethargic and he was unable to provide any meaningful history. Oral mechanism exam was limited due to pt's difficulty following commands, but oral inspection revealed reduced dentition in poor condition. Vocal quality was breathy/hoarse and intensity reduced; vocal fold insufficiency is therefore suspected considering pt's prolonged intubation. He presented with symptoms of post-extubation dysphagia characterized by signs of aspiration across trials, but no s/s of aspiration were noted with individual ice chips. It is recommended that pt's NPO status and enteral nutrition (i.e., Cortrak) be continued. Pt may have individual ice chips after oral care. SLP will follow pt to assess improvement, but with anticipation that an instrumental assessment will  likely be clinically indicated. SLP Visit Diagnosis: Dysphagia, unspecified (R13.10)    Aspiration Risk  Moderate aspiration risk    Diet Recommendation NPO;Ice chips PRN after oral care;Alternative means - temporary   Medication Administration: Via alternative means    Other  Recommendations Oral Care Recommendations: Oral care QID;Oral care prior to ice chip/H20    Recommendations for follow up therapy are one component of a multi-disciplinary discharge planning process, led by the attending physician.  Recommendations may be updated based on patient status, additional functional criteria and insurance authorization.  Follow up Recommendations Skilled nursing-short term rehab (<3 hours/day)      Assistance Recommended at Discharge    Functional Status Assessment Patient has had a recent decline in their functional status and demonstrates the ability to make significant improvements in function in a reasonable and predictable amount of time.  Frequency and Duration min 2x/week  2 weeks       Prognosis Prognosis for Safe Diet Advancement: Good Barriers to Reach Goals: Cognitive deficits      Swallow Study   General Date of Onset: 08/09/22 HPI: Pt is a 71 yo M admittted 11/20 after being found down outside of his group home. Pt found to have mild nasal fracture MRI brain 11/20 negative for acute changes. Pt seen by SLP 11/21 and 11/22; a dysphagia 2 diet with thin liquids initiated on 11/22. Pt developed progressively worsening lethargy, increasing hypotension, rising WBC, near obtundation, and SOB and was transferred to the ICU 11/23; ETT 11/23-11/30. Pt found to have sepsis, UTI with septic shock, and PNA. PMH: dementia, T2DM, Htn, HLD, CVA, housing insecurity, and tobacco use disorder. BSE 05/09/21: regular texture diet and thin liquids recommended without need for follow up. Type  of Study: Bedside Swallow Evaluation Previous Swallow Assessment: See HPI Diet Prior to this Study:  NPO;NG Tube Temperature Spikes Noted: No Respiratory Status: Room air History of Recent Intubation: Yes Length of Intubations (days): 7 days Date extubated: 08/18/22 Behavior/Cognition: Requires cueing;Doesn't follow directions;Distractible Oral Cavity Assessment: Within Functional Limits Oral Care Completed by SLP: No Oral Cavity - Dentition: Missing dentition;Poor condition Vision: Impaired for self-feeding Self-Feeding Abilities: Total assist Patient Positioning: Upright in bed;Postural control adequate for testing Baseline Vocal Quality: Low vocal intensity;Breathy;Hoarse Volitional Cough: Cognitively unable to elicit Volitional Swallow: Unable to elicit    Oral/Motor/Sensory Function Overall Oral Motor/Sensory Function:  (unable to assess)   Ice Chips Ice chips: Impaired Presentation: Spoon Pharyngeal Phase Impairments: Suspected delayed Swallow;Cough - Immediate;Throat Clearing - Immediate (with multiple ice chips)   Thin Liquid Thin Liquid: Impaired Presentation: Spoon;Straw Pharyngeal  Phase Impairments: Throat Clearing - Delayed    Nectar Thick Nectar Thick Liquid: Not tested   Honey Thick Honey Thick Liquid: Not tested   Puree Puree: Impaired Presentation: Spoon Pharyngeal Phase Impairments: Throat Clearing - Immediate;Cough - Delayed   Solid     Solid: Not tested      Charles Fox 08/19/2022,11:24 AM

## 2022-08-19 NOTE — Progress Notes (Signed)
NAME:  Charles Fox, MRN:  [id number], DOB:  November 10, 1950, LOS: 11 ADMISSION DATE:  08/08/2022, CONSULTATION DATE:  08/11/22 REFERRING MD:  Aundria Rud, CHIEF COMPLAINT:  confusion   History of Present Illness:  71 year old man hx of DM2, HTN, HLD, CVA, dementia resides in SNF ward of the state presenting with AMS questionable fall and seizure.  Nasal fx on imaging.  Neuro workup neg (MRI/EEG), question raised of progressive dementia or hypertensive encephalopathy.  Worsening lethargy starting yesterday and progressive through day now more hypotensive, rising WBC, near obtundation, SOB prompting ICU eval.  Bladder scan with urinary retention CXR question evolving R infiltrate  Hx per chart review due to patient condition.  Pertinent  Medical History  Dementia, DM, HTN   Significant Hospital Events: Including procedures, antibiotic start and stop dates in addition to other pertinent events   08/08/22 admit 11/22 worsening mental status 11/23 ICU consult, intubated, started on pressors for profound shock with lactic acidosis. 11/24 Pressor requirements improved 11/29 weaning on vent but variable mentaiton 11/30 extubated 12/1 transfer out of unit   Interim History / Subjective:   NAEO   Objective   Blood pressure (!) 142/66, pulse (!) 112, temperature 99.5 F (37.5 C), temperature source Axillary, resp. rate (!) 44, height 6' (1.829 m), weight 114 kg, SpO2 95 %.        Intake/Output Summary (Last 24 hours) at 08/19/2022 0936 Last data filed at 08/19/2022 0930 Gross per 24 hour  Intake 2010.93 ml  Output 7825 ml  Net -5814.07 ml   Filed Weights   08/17/22 0348 08/18/22 0354 08/19/22 0500  Weight: 117.6 kg 119.3 kg 114 kg    Examination: General:  obese ill appearing M HEENT: NCAT cortrak in place  Neuro:  Awake following commands CV: rrr s1s2 no rgm  PULM:  CTAb even unlabored  GI: obese soft ndnt  GU: foley, clear urine  Extremities:  no acute joint deformity no  cyanosis or clubbing  Skin: c/d/w   Resolved Hospital Problem list   N/A  Assessment & Plan:   Acute metabolic encephalopathy superimposed on dementia - improving  -Workup including MRI has been negative. Due to septic shock, icu delirium. P: -delirium precaution  -correct metabolic abnormalities as able   HCAP, aspiration PNA  P: -on ancef for below   Septic shock w multisystem organ failure (resp failure, liver dysfunction, AKI, improved, encephalopathy)  due to ecoli bacteremia, HCAP, UTI -- improved shock  P -ancef -trend fever curve, WBC Urinary retention  - [place] w no hydronephrosis. P: -cont urecholine -hopefully dc foley 12/1  Transaminitis  Likely secondary to septic shock/  P: -PRN CMP   DM2 -semglee 10 units BID, SSI q4, EN coverage q4  -adjust as diet advanced  HTN HLD P: -restarted hom coreg -PRN hydral  -ASA, statin   Anemia ,critical illness  P: -Trend cbc PRN  Inadequate PO intake Hypophosphatemia -NPO rec from SLP -- EN per RDN via cortrak  -replace phos   GOC ward of state  -will continue to update care taker and family -full code   L/T/D -keep cortrak -dc foley 12/1  Best Practice (right click and "Reselect all SmartList Selections" daily)   Diet/type: tubefeeds DVT prophylaxis: prophylactic heparin  GI prophylaxis: H2B Lines: N/A Foley:  removal ordered  Code Status:  full code Last date of multidisciplinary goals of care discussion [11/29 called Adrienne and updated over phone]  Likely transfer out of the ICU 12/1  CCT: n/a   Tessie Fass MSN, AGACNP-BC [facility] Pulmonary/Critical Care Medicine Amion for pager  08/19/2022, 9:36 AM

## 2022-08-19 NOTE — Progress Notes (Signed)
eLink Physician-Brief Progress Note Patient Name: Charles Fox DOB: 01/10/1951 MRN: [id number]   Date of Service  08/19/2022  HPI/Events of Note  Hypertension - BP = 168/68.  eICU Interventions  Plan: Hydralazine 10 mg IV Q 4 hours PRN SBP > 160 or DBP > 100.     Intervention Category Major Interventions: Hypertension - evaluation and management  Aurore Redinger Eugene 08/19/2022, 2:43 AM

## 2022-08-20 LAB — BASIC METABOLIC PANEL
Anion gap: 9 (ref 5–15)
BUN: 8 mg/dL (ref 8–23)
CO2: 28 mmol/L (ref 22–32)
Calcium: 8.6 mg/dL — ABNORMAL LOW (ref 8.9–10.3)
Chloride: 105 mmol/L (ref 98–111)
Creatinine, Ser: 0.56 mg/dL — ABNORMAL LOW (ref 0.61–1.24)
GFR, Estimated: >60 mL/min (ref >60)
Glucose, Bld: 126 mg/dL — ABNORMAL HIGH (ref 70–99)
Potassium: 4 mmol/L (ref 3.5–5.1)
Sodium: 142 mmol/L (ref 135–145)

## 2022-08-20 LAB — GLUCOSE, CAPILLARY
Glucose-Capillary: 116 mg/dL — ABNORMAL HIGH (ref 70–99)
Glucose-Capillary: 119 mg/dL — ABNORMAL HIGH (ref 70–99)
Glucose-Capillary: 135 mg/dL — ABNORMAL HIGH (ref 70–99)
Glucose-Capillary: 76 mg/dL (ref 70–99)
Glucose-Capillary: 181 mg/dL — ABNORMAL HIGH (ref 70–99)

## 2022-08-20 LAB — PHOSPHORUS: Phosphorus: 3.3 mg/dL (ref 2.5–4.6)

## 2022-08-20 NOTE — Plan of Care (Signed)
  Problem: Education: Goal: Knowledge of disease or condition will improve Outcome: Not Progressing   Problem: Ischemic Stroke/TIA Tissue Perfusion: Goal: Complications of ischemic stroke/TIA will be minimized Outcome: Not Progressing

## 2022-08-20 NOTE — Progress Notes (Signed)
PROGRESS NOTE    Charles Fox  IEP:[id number] DOB: 1950/10/06 DOA: 08/08/2022 PCP: Pcp, No   Brief Narrative:  71 year old man hx of DM2, HTN, HLD, CVA, dementia resides in SNF ward of the state presenting with AMS questionable fall and seizure. Nasal fx on imaging. Neuro workup neg (MRI/EEG), question raised of progressive dementia or hypertensive encephalopathy. Worsening lethargy starting yesterday and progressive through day now more hypotensive, rising WBC, near obtundation, SOB prompting ICU eval.  08/08/22 admit 11/22 worsening mental status 11/23 ICU consult, intubated, started on pressors for profound shock with lactic acidosis. 11/24 Pressor requirements improved 11/29 weaning on vent but variable mentaiton 11/30 extubated 12/2 transfer to [facility]  Assessment & Plan:   Principal Problem:   Syncope Active Problems:   Altered mental status   Closed fracture of nasal bones   Controlled diabetes mellitus type 2 with complications (HCC)   Fall   AKI (acute kidney injury) (HCC)   Essential hypertension   Encephalopathy acute   Septic shock due to Escherichia coli (HCC)   Hypophosphatemia   Acute respiratory failure (HCC)   E coli bacteremia   Sepsis due to Escherichia coli with acute renal failure and septic shock (HCC)   Acute metabolic encephalopathy superimposed on dementia - improving  -Workup including MRI has been negative. Likely due to septic shock, icu delirium. -delirium precautions ongoing  -correct metabolic abnormalities as necessary   HCAP, aspiration PNA  Completed ancef course   Septic shock w multisystem organ failure (resp failure, liver dysfunction, AKI, improved, encephalopathy)  due to ecoli bacteremia, HCAP, UTI - shock resolved - Completed ancef course  Urinary retention  - [place] w no hydronephrosis. - Continue urecholine - Foley out 12/1   Transaminitis  Likely secondary to septic shock -CMP stable - follow PRN   DM2, Insulin  dependent, uncontrolled with hyperglycemia -semglee 10 units BID, SSI q4, EN coverage q4  -adjust per diet Lab Results  Component Value Date   HGBA1C 6.6 (H) 08/08/2022   HTN HLD -Continue coreg -PRN hydralazine -ASA, statin ongoing   Anemia, acute secondary to critical illness  -Trend cbc PRN   Inadequate PO intake Hypophosphatemia -NPO rec from SLP - follow labs   Goals of care -Ward of state  -Will continue to update care taker and family -Full code   DVT prophylaxis: heparin Code Status: Full Family Communication: Ward of state - no family available  Status is: INpt  Dispo: The patient is from: Facility              Anticipated d/c is to: Same              Anticipated d/c date is: 48-72h              Patient currently NOT medically stable for discharge  Consultants:  PCCM  Antimicrobials:  Completed 10 day course(ceftriaxone/cefazolin)  Subjective: No acute issues/events overnight, ROS limited  Objective: Vitals:   08/20/22 0520 08/20/22 0521 08/20/22 0522 08/20/22 0523  BP:    (!) 180/73  Pulse: 89 91 85 89  Resp:    20  Temp:      TempSrc:      SpO2: 95% 99% 93% 99%  Weight:      Height:        Intake/Output Summary (Last 24 hours) at 08/20/2022 0727 Last data filed at 08/20/2022 0445 Gross per 24 hour  Intake 508.31 ml  Output 3850 ml  Net -3341.69 ml  Filed Weights   08/17/22 0348 08/18/22 0354 08/19/22 0500  Weight: 117.6 kg 119.3 kg 114 kg    Examination:  General exam: Appears calm and comfortable  Respiratory system: Clear to auscultation. Respiratory effort normal. Cardiovascular system: S1 & S2 heard, RRR. No JVD, murmurs, rubs, gallops or clicks. No pedal edema. Gastrointestinal system: Abdomen is nondistended, soft and nontender. No organomegaly or masses felt. Normal bowel sounds heard. Central nervous system: Alert and oriented. No focal neurological deficits. Extremities: Symmetric 5 x 5 power. Skin: No rashes, lesions or  ulcers Psychiatry: Judgement and insight appear normal. Mood & affect appropriate.     Data Reviewed: I have personally reviewed following labs and imaging studies  CBC: Recent Labs  Lab 08/14/22 0415 08/15/22 0444 08/16/22 0356 08/17/22 0404 08/18/22 0500  WBC 27.0* 24.8* 24.3* 20.8* 19.0*  HGB 9.2* 9.0* 8.9* 8.5* 8.5*  HCT 28.9* 27.6* 28.8* 26.3* 27.5*  MCV 93.2 93.2 96.0 95.3 95.8  PLT 112* 122* 170 205 260   Basic Metabolic Panel: Recent Labs  Lab 08/13/22 1701 08/13/22 1701 08/14/22 0415 08/14/22 1640 08/15/22 0444 08/16/22 0356 08/17/22 0404 08/18/22 0500 08/19/22 0439 08/20/22 0247  NA  --    < > 141  --  144 146* 144 145 140 142  K  --    < > 3.9  --  4.0 4.4 4.6 4.3 4.3 4.0  CL  --    < > 110  --  114* 114* 111 113* 99 105  CO2  --    < > 21*  --  23 23 26 28 27 28   GLUCOSE  --    < > 226*  --  277* 285* 225* 151* 146* 126*  BUN  --    < > 58*  --  37* 23 16 12 8 8   CREATININE  --    < > 1.24  --  0.91 0.78 0.70 0.70 0.55* 0.56*  CALCIUM  --    < > 7.7*  --  7.8* 7.8* 7.5* 7.7* 8.1* 8.6*  MG 2.3  --  2.4 2.2 2.1 2.0  --   --   --   --   PHOS 2.6  --  1.8* 2.4* 1.7* 2.1* 1.9* 2.0* 1.9* 3.3   < > = values in this interval not displayed.   GFR: Estimated Creatinine Clearance: 113.6 mL/min (A) (by C-G formula based on SCr of 0.56 mg/dL (L)). Liver Function Tests: Recent Labs  Lab 08/14/22 0415 08/15/22 0444 08/16/22 0356 08/17/22 0404 08/18/22 0500  AST 112* 109* 85* 118* 127*  ALT 235* 197* 175* 140* 123*  ALKPHOS 118 218* 239* 287* 305*  BILITOT 1.0 0.6 0.4 0.7 0.2*  PROT 5.4* 4.9* 5.0* 4.6* 5.0*  ALBUMIN 2.1* 1.8* 1.8* 1.5* 1.6*   No results for input(s): "LIPASE", "AMYLASE" in the last 168 hours. No results for input(s): "AMMONIA" in the last 168 hours. Coagulation Profile: No results for input(s): "INR", "PROTIME" in the last 168 hours. Cardiac Enzymes: No results for input(s): "CKTOTAL", "CKMB", "CKMBINDEX", "TROPONINI" in the last 168  hours. BNP (last 3 results) No results for input(s): "PROBNP" in the last 8760 hours. HbA1C: No results for input(s): "HGBA1C" in the last 72 hours. CBG: Recent Labs  Lab 08/19/22 1206 08/19/22 1530 08/19/22 2026 08/19/22 2338 08/20/22 0359  GLUCAP 133* 111* 122* 159* 116*   Lipid Profile: No results for input(s): "CHOL", "HDL", "LDLCALC", "TRIG", "CHOLHDL", "LDLDIRECT" in the last 72 hours. Thyroid Function Tests: No results  for input(s): "TSH", "T4TOTAL", "FREET4", "T3FREE", "THYROIDAB" in the last 72 hours. Anemia Panel: No results for input(s): "VITAMINB12", "FOLATE", "FERRITIN", "TIBC", "IRON", "RETICCTPCT" in the last 72 hours. Sepsis Labs: Recent Labs  Lab 08/13/22 0815  LATICACIDVEN 2.0*    Recent Results (from the past 240 hour(s))  Culture, blood (Routine X 2) w Reflex to ID Panel     Status: Abnormal   Collection Time: 08/10/22  9:43 PM   Specimen: BLOOD  Result Value Ref Range Status   Specimen Description BLOOD SITE NOT SPECIFIED  Final   Special Requests   Final    BOTTLES DRAWN AEROBIC AND ANAEROBIC Blood Culture adequate volume   Culture  Setup Time   Final    GRAM NEGATIVE RODS IN BOTH AEROBIC AND ANAEROBIC BOTTLES CRITICAL RESULT CALLED TO, READ BACK BY AND VERIFIED WITH: PHARMD L.BELL AT 1112 ON 08/11/22 BY T.SAAD. Performed at [facility], [address], [place], [place] [address]    Culture ESCHERICHIA COLI (A)  Final   Report Status 08/13/2022 FINAL  Final   Organism ID, Bacteria ESCHERICHIA COLI  Final      Susceptibility   Escherichia coli - MIC*    AMPICILLIN 4 SENSITIVE Sensitive     CEFAZOLIN <=4 SENSITIVE Sensitive     CEFEPIME <=0.12 SENSITIVE Sensitive     CEFTAZIDIME <=1 SENSITIVE Sensitive     CEFTRIAXONE <=0.25 SENSITIVE Sensitive     CIPROFLOXACIN >=4 RESISTANT Resistant     GENTAMICIN <=1 SENSITIVE Sensitive     IMIPENEM <=0.25 SENSITIVE Sensitive     TRIMETH/SULFA <=20 SENSITIVE Sensitive     AMPICILLIN/SULBACTAM <=2  SENSITIVE Sensitive     PIP/TAZO <=4 SENSITIVE Sensitive     * ESCHERICHIA COLI  Blood Culture ID Panel (Reflexed)     Status: Abnormal   Collection Time: 08/10/22  9:43 PM  Result Value Ref Range Status   Enterococcus faecalis NOT DETECTED NOT DETECTED Final   Enterococcus Faecium NOT DETECTED NOT DETECTED Final   Listeria monocytogenes NOT DETECTED NOT DETECTED Final   Staphylococcus species NOT DETECTED NOT DETECTED Final   Staphylococcus aureus (BCID) NOT DETECTED NOT DETECTED Final   Staphylococcus epidermidis NOT DETECTED NOT DETECTED Final   Staphylococcus lugdunensis NOT DETECTED NOT DETECTED Final   Streptococcus species NOT DETECTED NOT DETECTED Final   Streptococcus agalactiae NOT DETECTED NOT DETECTED Final   Streptococcus pneumoniae NOT DETECTED NOT DETECTED Final   Streptococcus pyogenes NOT DETECTED NOT DETECTED Final   A.calcoaceticus-baumannii NOT DETECTED NOT DETECTED Final   Bacteroides fragilis NOT DETECTED NOT DETECTED Final   Enterobacterales DETECTED (A) NOT DETECTED Final    Comment: Enterobacterales represent a large order of gram negative bacteria, not a single organism. CRITICAL RESULT CALLED TO, READ BACK BY AND VERIFIED WITH: PHARMD L.BELL AT 1112 ON 08/11/22 BY T.SAAD.    Enterobacter cloacae complex NOT DETECTED NOT DETECTED Final   Escherichia coli DETECTED (A) NOT DETECTED Final    Comment: CRITICAL RESULT CALLED TO, READ BACK BY AND VERIFIED WITH: PHARMD L.BELL AT 1112 ON 08/11/22 BY T.SAAD.    Klebsiella aerogenes NOT DETECTED NOT DETECTED Final   Klebsiella oxytoca NOT DETECTED NOT DETECTED Final   Klebsiella pneumoniae NOT DETECTED NOT DETECTED Final   Proteus species NOT DETECTED NOT DETECTED Final   Salmonella species NOT DETECTED NOT DETECTED Final   Serratia marcescens NOT DETECTED NOT DETECTED Final   Haemophilus influenzae NOT DETECTED NOT DETECTED Final   Neisseria meningitidis NOT DETECTED NOT DETECTED Final  Pseudomonas aeruginosa  NOT DETECTED NOT DETECTED Final   Stenotrophomonas maltophilia NOT DETECTED NOT DETECTED Final   Candida albicans NOT DETECTED NOT DETECTED Final   Candida auris NOT DETECTED NOT DETECTED Final   Candida glabrata NOT DETECTED NOT DETECTED Final   Candida krusei NOT DETECTED NOT DETECTED Final   Candida parapsilosis NOT DETECTED NOT DETECTED Final   Candida tropicalis NOT DETECTED NOT DETECTED Final   Cryptococcus neoformans/gattii NOT DETECTED NOT DETECTED Final   CTX-M ESBL NOT DETECTED NOT DETECTED Final   Carbapenem resistance IMP NOT DETECTED NOT DETECTED Final   Carbapenem resistance KPC NOT DETECTED NOT DETECTED Final   Carbapenem resistance NDM NOT DETECTED NOT DETECTED Final   Carbapenem resist OXA 48 LIKE NOT DETECTED NOT DETECTED Final   Carbapenem resistance VIM NOT DETECTED NOT DETECTED Final    Comment: Performed at [facility], [address], [place], [place] [address]  Culture, blood (Routine X 2) w Reflex to ID Panel     Status: Abnormal   Collection Time: 08/10/22  9:53 PM   Specimen: BLOOD  Result Value Ref Range Status   Specimen Description BLOOD SITE NOT SPECIFIED  Final   Special Requests   Final    BOTTLES DRAWN AEROBIC AND ANAEROBIC Blood Culture results may not be optimal due to an inadequate volume of blood received in culture bottles   Culture  Setup Time   Final    GRAM NEGATIVE RODS IN BOTH AEROBIC AND ANAEROBIC BOTTLES CRITICAL VALUE NOTED.  VALUE IS CONSISTENT WITH PREVIOUSLY REPORTED AND CALLED VALUE.    Culture (A)  Final    ESCHERICHIA COLI SUSCEPTIBILITIES PERFORMED ON PREVIOUS CULTURE WITHIN THE LAST 5 DAYS. Performed at [facility], [address], [place], [place] [address]    Report Status 08/13/2022 FINAL  Final  Urine Culture     Status: Abnormal   Collection Time: 08/11/22  4:54 AM   Specimen: Urine, Catheterized  Result Value Ref Range Status   Specimen Description URINE, CATHETERIZED  Final   Special Requests   Final     NONE Performed at [facility], [address], [place], [place] [address]    Culture 70,000 COLONIES/mL ESCHERICHIA COLI (A)  Final   Report Status 08/13/2022 FINAL  Final   Organism ID, Bacteria ESCHERICHIA COLI (A)  Final      Susceptibility   Escherichia coli - MIC*    AMPICILLIN 4 SENSITIVE Sensitive     CEFAZOLIN <=4 SENSITIVE Sensitive     CEFEPIME <=0.12 SENSITIVE Sensitive     CEFTRIAXONE <=0.25 SENSITIVE Sensitive     CIPROFLOXACIN >=4 RESISTANT Resistant     GENTAMICIN <=1 SENSITIVE Sensitive     IMIPENEM <=0.25 SENSITIVE Sensitive     NITROFURANTOIN 64 INTERMEDIATE Intermediate     TRIMETH/SULFA <=20 SENSITIVE Sensitive     AMPICILLIN/SULBACTAM <=2 SENSITIVE Sensitive     PIP/TAZO <=4 SENSITIVE Sensitive     * 70,000 COLONIES/mL ESCHERICHIA COLI  MRSA Next Gen by PCR, Nasal     Status: None   Collection Time: 08/11/22  5:33 AM   Specimen: Nasal Mucosa; Nasal Swab  Result Value Ref Range Status   MRSA by PCR Next Gen NOT DETECTED NOT DETECTED Final    Comment: (NOTE) The GeneXpert MRSA Assay (FDA approved for NASAL specimens only), is one component of a comprehensive MRSA colonization surveillance program. It is not intended to diagnose MRSA infection nor to guide or monitor treatment for MRSA infections.  Test performance is not FDA approved in patients less than 6 years old. Performed at [facility], [address] [place], [place] [address]   Culture, Respiratory w Gram Stain     Status: None   Collection Time: 08/12/22  9:12 AM   Specimen: Tracheal Aspirate; Respiratory  Result Value Ref Range Status   Specimen Description TRACHEAL ASPIRATE  Final   Special Requests NONE  Final   Gram Stain   Final    ABUNDANT WBC PRESENT, PREDOMINANTLY PMN NO ORGANISMS SEEN    Culture   Final    NO GROWTH 2 DAYS Performed at [facility], [address], [place], [place] [address]    Report Status 08/15/2022 FINAL  Final   Radiology Studies: No  results found.  Scheduled Meds:  acetaminophen (TYLENOL) oral liquid 160 mg/5 mL  650 mg Per Tube Q6H   Or   acetaminophen  650 mg Rectal Q6H   aspirin  81 mg Per Tube Daily   atorvastatin  40 mg Per Tube Daily   carvedilol  6.25 mg Per Tube BID WC   Chlorhexidine Gluconate Cloth  6 each Topical Daily   citalopram  40 mg Per Tube Daily   docusate  100 mg Per Tube BID   feeding supplement (PROSource TF20)  60 mL Per Tube Daily   free water  100 mL Per Tube Q4H   heparin injection (subcutaneous)  5,000 Units Subcutaneous Q8H   insulin aspart  0-15 Units Subcutaneous Q4H   insulin aspart  8 Units Subcutaneous Q4H   insulin glargine-yfgn  10 Units Subcutaneous BID   multivitamin with minerals  1 tablet Per Tube Daily   nicotine  14 mg Transdermal Daily   mouth rinse  15 mL Mouth Rinse 4 times per day   polyethylene glycol  17 g Per Tube Daily   QUEtiapine  75 mg Per Tube QHS   Continuous Infusions:  feeding supplement (VITAL 1.5 CAL) 55 mL/hr at 08/19/22 1400     LOS: 12 days   Time spent:  Azucena Fallen, DO [facility]  If 7PM-7AM, please contact night-coverage [email or web address]  08/20/2022, 7:27 AM

## 2022-08-21 LAB — GLUCOSE, CAPILLARY
Glucose-Capillary: 111 mg/dL — ABNORMAL HIGH (ref 70–99)
Glucose-Capillary: 113 mg/dL — ABNORMAL HIGH (ref 70–99)
Glucose-Capillary: 117 mg/dL — ABNORMAL HIGH (ref 70–99)
Glucose-Capillary: 120 mg/dL — ABNORMAL HIGH (ref 70–99)
Glucose-Capillary: 138 mg/dL — ABNORMAL HIGH (ref 70–99)
Glucose-Capillary: 158 mg/dL — ABNORMAL HIGH (ref 70–99)
Glucose-Capillary: 68 mg/dL — ABNORMAL LOW (ref 70–99)
Glucose-Capillary: 79 mg/dL (ref 70–99)

## 2022-08-21 NOTE — Plan of Care (Signed)
  Problem: Education: Goal: Knowledge of disease or condition will improve Outcome: Not Progressing   Problem: Health Behavior/Discharge Planning: Goal: Ability to manage health-related needs will improve Outcome: Not Progressing   

## 2022-08-21 NOTE — Progress Notes (Signed)
4 oz juice given for a bs of 68-will recheck bs-see labs

## 2022-08-21 NOTE — Progress Notes (Signed)
PROGRESS NOTE    Charles Fox  WGN:[id number] DOB: [birth year] DOA: 08/08/2022 PCP: Pcp, No   Brief Narrative:  71 year old man hx of DM2, HTN, HLD, CVA, dementia resides in SNF ward of the state presenting with AMS questionable fall and seizure. Nasal fx on imaging. Neuro workup neg (MRI/EEG), question raised of progressive dementia or hypertensive encephalopathy. Worsening lethargy starting yesterday and progressive through day now more hypotensive, rising WBC, near obtundation, SOB prompting ICU eval.  08/08/22 admit 11/22 worsening mental status 11/23 ICU consult, intubated, started on pressors for profound shock with lactic acidosis. 11/24 Pressor requirements improved 11/29 weaning on vent but variable mentaiton 11/30 extubated 12/2 transfer to [facility] 12/3 Mental status finally improving - advance diet per SLP - once able to tolerate PO safely can remove NG and DC back to facility.  Assessment & Plan:   Principal Problem:   Syncope Active Problems:   Altered mental status   Closed fracture of nasal bones   Controlled diabetes mellitus type 2 with complications (HCC)   Fall   AKI (acute kidney injury) (HCC)   Essential hypertension   Encephalopathy acute   Septic shock due to Escherichia coli (HCC)   Hypophosphatemia   Acute respiratory failure (HCC)   E coli bacteremia   Sepsis due to Escherichia coli with acute renal failure and septic shock (HCC)   Acute metabolic encephalopathy superimposed on dementia - improving  -Workup including MRI has been negative. Likely due to septic shock, icu delirium. -delirium precautions ongoing  -correct metabolic abnormalities as necessary   HCAP, aspiration PNA  Completed ancef course   Septic shock w multisystem organ failure (resp failure, liver dysfunction, AKI, improved, encephalopathy)  due to ecoli bacteremia, HCAP, UTI - shock resolved - Completed ancef course  Urinary retention  - [place] w no hydronephrosis. - Continue  urecholine - Foley out 12/1   Transaminitis  Likely secondary to septic shock -CMP stable - follow PRN   DM2, Insulin dependent, uncontrolled with hyperglycemia -semglee 10 units BID, SSI q4, EN coverage q4  -adjust per diet Lab Results  Component Value Date   HGBA1C 6.6 (H) 08/08/2022   HTN HLD -Continue coreg -PRN hydralazine -ASA, statin ongoing   Anemia, acute secondary to critical illness  -Trend cbc PRN   Inadequate PO intake Hypophosphatemia -NPO rec from SLP - follow labs   Goals of care -Ward of state  -Will continue to update care taker and family -Full code   DVT prophylaxis: heparin Code Status: Full Family Communication: Ward of state - no family available  Status is: INpt  Dispo: The patient is from: Facility              Anticipated d/c is to: Same              Anticipated d/c date is: 48-72h              Patient currently NOT medically stable for discharge  Consultants:  PCCM  Antimicrobials:  Completed 10 day course(ceftriaxone/cefazolin)  Subjective: No acute issues/events overnight, ROS limited  Objective: Vitals:   08/20/22 1949 08/20/22 2324 08/21/22 0342 08/21/22 0400  BP: (!) 152/83 (!) 141/57  (!) 169/77  Pulse: 100 89  80  Resp: 20 18 18    Temp: 98.3 F (36.8 C) 97.8 F (36.6 C) 98.3 F (36.8 C)   TempSrc: Oral Axillary Oral   SpO2: 100% 99% 98%   Weight:      Height:  Intake/Output Summary (Last 24 hours) at 08/21/2022 0734 Last data filed at 08/21/2022 1610 Gross per 24 hour  Intake 800 ml  Output 2975 ml  Net -2175 ml    Filed Weights   08/17/22 0348 08/18/22 0354 08/19/22 0500  Weight: 117.6 kg 119.3 kg 114 kg    Examination:  General exam: Appears calm and comfortable  Respiratory system: Clear to auscultation. Respiratory effort normal. Cardiovascular system: S1 & S2 heard, RRR. No JVD, murmurs, rubs, gallops or clicks. No pedal edema. Gastrointestinal system: Abdomen is nondistended, soft and  nontender. No organomegaly or masses felt. Normal bowel sounds heard. Central nervous system: Alert and oriented. No focal neurological deficits. Extremities: Symmetric 5 x 5 power. Skin: No rashes, lesions or ulcers Psychiatry: Judgement and insight appear normal. Mood & affect appropriate.     Data Reviewed: I have personally reviewed following labs and imaging studies  CBC: Recent Labs  Lab 08/15/22 0444 08/16/22 0356 08/17/22 0404 08/18/22 0500  WBC 24.8* 24.3* 20.8* 19.0*  HGB 9.0* 8.9* 8.5* 8.5*  HCT 27.6* 28.8* 26.3* 27.5*  MCV 93.2 96.0 95.3 95.8  PLT 122* 170 205 260    Basic Metabolic Panel: Recent Labs  Lab 08/14/22 1640 08/14/22 1640 08/15/22 0444 08/16/22 0356 08/17/22 0404 08/18/22 0500 08/19/22 0439 08/20/22 0247  NA  --    < > 144 146* 144 145 140 142  K  --    < > 4.0 4.4 4.6 4.3 4.3 4.0  CL  --    < > 114* 114* 111 113* 99 105  CO2  --    < > 23 23 26 28 27 28   GLUCOSE  --    < > 277* 285* 225* 151* 146* 126*  BUN  --    < > 37* 23 16 12 8 8   CREATININE  --    < > 0.91 0.78 0.70 0.70 0.55* 0.56*  CALCIUM  --    < > 7.8* 7.8* 7.5* 7.7* 8.1* 8.6*  MG 2.2  --  2.1 2.0  --   --   --   --   PHOS 2.4*  --  1.7* 2.1* 1.9* 2.0* 1.9* 3.3   < > = values in this interval not displayed.    GFR: Estimated Creatinine Clearance: 113.6 mL/min (A) (by C-G formula based on SCr of 0.56 mg/dL (L)). Liver Function Tests: Recent Labs  Lab 08/15/22 0444 08/16/22 0356 08/17/22 0404 08/18/22 0500  AST 109* 85* 118* 127*  ALT 197* 175* 140* 123*  ALKPHOS 218* 239* 287* 305*  BILITOT 0.6 0.4 0.7 0.2*  PROT 4.9* 5.0* 4.6* 5.0*  ALBUMIN 1.8* 1.8* 1.5* 1.6*    No results for input(s): "LIPASE", "AMYLASE" in the last 168 hours. No results for input(s): "AMMONIA" in the last 168 hours. Coagulation Profile: No results for input(s): "INR", "PROTIME" in the last 168 hours. Cardiac Enzymes: No results for input(s): "CKTOTAL", "CKMB", "CKMBINDEX", "TROPONINI" in the  last 168 hours. BNP (last 3 results) No results for input(s): "PROBNP" in the last 8760 hours. HbA1C: No results for input(s): "HGBA1C" in the last 72 hours. CBG: Recent Labs  Lab 08/20/22 1605 08/20/22 2016 08/21/22 0010 08/21/22 0430 08/21/22 0447  GLUCAP 76 181* 158* 113* 120*    Lipid Profile: No results for input(s): "CHOL", "HDL", "LDLCALC", "TRIG", "CHOLHDL", "LDLDIRECT" in the last 72 hours. Thyroid Function Tests: No results for input(s): "TSH", "T4TOTAL", "FREET4", "T3FREE", "THYROIDAB" in the last 72 hours. Anemia Panel: No results for input(s): "  VITAMINB12", "FOLATE", "FERRITIN", "TIBC", "IRON", "RETICCTPCT" in the last 72 hours. Sepsis Labs: No results for input(s): "PROCALCITON", "LATICACIDVEN" in the last 168 hours.   Recent Results (from the past 240 hour(s))  Culture, Respiratory w Gram Stain     Status: None   Collection Time: 08/12/22  9:12 AM   Specimen: Tracheal Aspirate; Respiratory  Result Value Ref Range Status   Specimen Description TRACHEAL ASPIRATE  Final   Special Requests NONE  Final   Gram Stain   Final    ABUNDANT WBC PRESENT, PREDOMINANTLY PMN NO ORGANISMS SEEN    Culture   Final    NO GROWTH 2 DAYS Performed at [facility], [address], [place], [place] [address]    Report Status 08/15/2022 FINAL  Final   Radiology Studies: No results found.  Scheduled Meds:  acetaminophen (TYLENOL) oral liquid 160 mg/5 mL  650 mg Per Tube Q6H   Or   acetaminophen  650 mg Rectal Q6H   aspirin  81 mg Per Tube Daily   atorvastatin  40 mg Per Tube Daily   carvedilol  6.25 mg Per Tube BID WC   Chlorhexidine Gluconate Cloth  6 each Topical Daily   citalopram  40 mg Per Tube Daily   docusate  100 mg Per Tube BID   feeding supplement (PROSource TF20)  60 mL Per Tube Daily   free water  100 mL Per Tube Q4H   heparin injection (subcutaneous)  5,000 Units Subcutaneous Q8H   insulin aspart  0-15 Units Subcutaneous Q4H   insulin aspart  8  Units Subcutaneous Q4H   insulin glargine-yfgn  10 Units Subcutaneous BID   multivitamin with minerals  1 tablet Per Tube Daily   nicotine  14 mg Transdermal Daily   mouth rinse  15 mL Mouth Rinse 4 times per day   polyethylene glycol  17 g Per Tube Daily   QUEtiapine  75 mg Per Tube QHS   Continuous Infusions:  feeding supplement (VITAL 1.5 CAL) 1,000 mL (08/21/22 0630)     LOS: 13 days   Time spent:  Azucena Fallen, DO [facility]  If 7PM-7AM, please contact night-coverage [email or web address]  08/21/2022, 7:34 AM

## 2022-08-22 ENCOUNTER — Inpatient Hospital Stay (HOSPITAL_COMMUNITY): Payer: Medicare Other

## 2022-08-22 LAB — GLUCOSE, CAPILLARY
Glucose-Capillary: 118 mg/dL — ABNORMAL HIGH (ref 70–99)
Glucose-Capillary: 133 mg/dL — ABNORMAL HIGH (ref 70–99)
Glucose-Capillary: 140 mg/dL — ABNORMAL HIGH (ref 70–99)
Glucose-Capillary: 141 mg/dL — ABNORMAL HIGH (ref 70–99)
Glucose-Capillary: 182 mg/dL — ABNORMAL HIGH (ref 70–99)
Glucose-Capillary: 194 mg/dL — ABNORMAL HIGH (ref 70–99)
Glucose-Capillary: 70 mg/dL (ref 70–99)

## 2022-08-22 NOTE — Progress Notes (Signed)
Modified Barium Swallow Progress Note  Patient Details  Name: Charles Fox MRN: [id number] Date of Birth: 1951/08/17  Today's Date: 08/22/2022  Modified Barium Swallow completed.  Full report located under Chart Review in the Imaging Section.  Brief recommendations include the following:  Clinical Impression  Pt presents with oropharyngeal dysphagia characterized by reduced posterior propulsion, weak lingual manipulation, reduced bolus cohesion and a pharyngeal delay. He demonstrated lingual pumping, difficulty with A-P transport of the barium tablet, and premature spillage to the valleculae and pyriform sinuses. Penetration (PAS 2,3) and silent aspiration (PAS 8) were noted with thin liquids. Transient penetration (PAS 2; WNL) was observed with consecutive swallows of nectar thick liquids via straw. Prompted coughing was ineffective in expelling aspirated material. Use of individual boluses of thin liquids via cup/straw as well as use of a chin tuck posture inconsistently improved laryngeal invasion to PAS 5, but aspiration was noted thereafter. A dysphagia 2 diet with nectar thick liquids is recommended at this time. SLP will follow for dysphagia treatment.   Swallow Evaluation Recommendations       SLP Diet Recommendations: Dysphagia 2 (Fine chop) solids;Nectar thick liquid   Liquid Administration via: Cup;Straw   Medication Administration: Crushed with puree   Supervision: Staff to assist with self feeding;Intermittent supervision to cue for compensatory strategies   Compensations: Slow rate;Small sips/bites   Postural Changes: Seated upright at 90 degrees   Oral Care Recommendations: Oral care BID      Charles Fox I. Vear Clock, MS, CCC-SLP Acute Rehabilitation Services Office number [phone]   Scheryl Marten 08/22/2022,4:19 PM

## 2022-08-22 NOTE — Progress Notes (Signed)
PROGRESS NOTE    Charles Fox  ATF:[id number] DOB: [birth year] DOA: 08/08/2022 PCP: Pcp, No   Brief Narrative:  71 year old man hx of DM2, HTN, HLD, CVA, dementia resides in SNF ward of the state presenting with AMS questionable fall and seizure. Nasal fx on imaging. Neuro workup neg (MRI/EEG), question raised of progressive dementia or hypertensive encephalopathy. Worsening lethargy starting yesterday and progressive through day now more hypotensive, rising WBC, near obtundation, SOB prompting ICU eval.  08/08/22 admit 11/22 worsening mental status 11/23 ICU consult, intubated, started on pressors for profound shock with lactic acidosis. 11/24 Pressor requirements improved 11/29 weaning on vent but variable mentaiton 11/30 extubated 12/2 transfer to [facility] 12/3 Mental status finally improving - advance diet per SLP - once able to tolerate PO safely can remove NG and DC back to facility.  Assessment & Plan:   Principal Problem:   Syncope Active Problems:   Altered mental status   Closed fracture of nasal bones   Controlled diabetes mellitus type 2 with complications (HCC)   Fall   AKI (acute kidney injury) (HCC)   Essential hypertension   Encephalopathy acute   Septic shock due to Escherichia coli (HCC)   Hypophosphatemia   Acute respiratory failure (HCC)   E coli bacteremia   Sepsis due to Escherichia coli with acute renal failure and septic shock (HCC)  Acute metabolic encephalopathy superimposed on dementia - improving  -Workup including MRI has been negative. Likely due to septic shock, icu delirium. -Delirium precautions ongoing but patient's mental status continues to improve, likely at baseline at this point given his baseline dementia.   HCAP, aspiration PNA  Dysphagia -Completed ancef course -Speech evaluation ongoing, remains n.p.o. at this time   Septic shock w multisystem organ failure (resp failure, liver dysfunction, AKI, improved, encephalopathy)  due to  ecoli bacteremia, HCAP, UTI - shock resolved -Completed ancef course  Urinary retention  -[place] w no hydronephrosis -Continue urecholine -Foley out 12/1   Transaminitis  Likely secondary to septic shock -CMP stable - follow PRN   DM2, Insulin dependent, uncontrolled with hyperglycemia -semglee 10 units BID, SSI q4, EN coverage q4  -adjust per diet Lab Results  Component Value Date   HGBA1C 6.6 (H) 08/08/2022   HTN HLD -Continue coreg -PRN hydralazine -ASA, statin ongoing   Anemia, acute secondary to critical illness  -Trend cbc PRN   Inadequate PO intake Hypophosphatemia -NPO rec from SLP - follow labs   Goals of care -Ward of state  -Will continue to update care taker and family -Full code   DVT prophylaxis: heparin Code Status: Full Family Communication: Ward of state -caretaker updated over the phone  Status is: Inpt  Dispo: The patient is from: Facility              Anticipated d/c is to: Same              Anticipated d/c date is: 48-72h              Patient currently NOT medically stable for discharge given ongoing need for NG tube feeds  Consultants:  PCCM  Antimicrobials:  Completed 10 day course(ceftriaxone/cefazolin).  Subjective: No acute issues/events overnight, ROS limited.  Objective: Vitals:   08/21/22 1525 08/21/22 1925 08/22/22 0045 08/22/22 0325  BP: (!) 161/76 (!) 163/76 (!) 160/77 (!) 146/74  Pulse: 81 82 92 92  Resp: 16   19  Temp: 98 F (36.7 C) 99.6 F (37.6 C) 98.5 F (36.9 C)  98.2 F (36.8 C)  TempSrc: Oral Oral Oral Oral  SpO2: 100% 97% 98% 100%  Weight:      Height:        Intake/Output Summary (Last 24 hours) at 08/22/2022 0733 Last data filed at 08/22/2022 0346 Gross per 24 hour  Intake 210 ml  Output 3575 ml  Net -3365 ml    Filed Weights   08/17/22 0348 08/18/22 0354 08/19/22 0500  Weight: 117.6 kg 119.3 kg 114 kg    Examination:  General exam: Appears calm and comfortable, more alert/conversational  today Respiratory system: Clear to auscultation. Respiratory effort normal. Cardiovascular system: S1 & S2 heard, RRR. No JVD, murmurs, rubs, gallops or clicks. No pedal edema. Gastrointestinal system: Abdomen is nondistended, soft and nontender. No organomegaly or masses felt. Normal bowel sounds heard. Central nervous system: Alert and oriented. No focal neurological deficits. Extremities: Symmetric 5 x 5 power. Skin: No rashes, lesions or ulcers  Data Reviewed: I have personally reviewed following labs and imaging studies  CBC: Recent Labs  Lab 08/16/22 0356 08/17/22 0404 08/18/22 0500  WBC 24.3* 20.8* 19.0*  HGB 8.9* 8.5* 8.5*  HCT 28.8* 26.3* 27.5*  MCV 96.0 95.3 95.8  PLT 170 205 260    Basic Metabolic Panel: Recent Labs  Lab 08/16/22 0356 08/17/22 0404 08/18/22 0500 08/19/22 0439 08/20/22 0247  NA 146* 144 145 140 142  K 4.4 4.6 4.3 4.3 4.0  CL 114* 111 113* 99 105  CO2 23 26 28 27 28   GLUCOSE 285* 225* 151* 146* 126*  BUN 23 16 12 8 8   CREATININE 0.78 0.70 0.70 0.55* 0.56*  CALCIUM 7.8* 7.5* 7.7* 8.1* 8.6*  MG 2.0  --   --   --   --   PHOS 2.1* 1.9* 2.0* 1.9* 3.3    GFR: Estimated Creatinine Clearance: 113.6 mL/min (A) (by C-G formula based on SCr of 0.56 mg/dL (L)). Liver Function Tests: Recent Labs  Lab 08/16/22 0356 08/17/22 0404 08/18/22 0500  AST 85* 118* 127*  ALT 175* 140* 123*  ALKPHOS 239* 287* 305*  BILITOT 0.4 0.7 0.2*  PROT 5.0* 4.6* 5.0*  ALBUMIN 1.8* 1.5* 1.6*    No results for input(s): "LIPASE", "AMYLASE" in the last 168 hours. No results for input(s): "AMMONIA" in the last 168 hours. Coagulation Profile: No results for input(s): "INR", "PROTIME" in the last 168 hours. Cardiac Enzymes: No results for input(s): "CKTOTAL", "CKMB", "CKMBINDEX", "TROPONINI" in the last 168 hours. BNP (last 3 results) No results for input(s): "PROBNP" in the last 8760 hours. HbA1C: No results for input(s): "HGBA1C" in the last 72  hours. CBG: Recent Labs  Lab 08/21/22 1314 08/21/22 1526 08/21/22 2131 08/22/22 0051 08/22/22 0330  GLUCAP 79 138* 117* 194* 140*    Lipid Profile: No results for input(s): "CHOL", "HDL", "LDLCALC", "TRIG", "CHOLHDL", "LDLDIRECT" in the last 72 hours. Thyroid Function Tests: No results for input(s): "TSH", "T4TOTAL", "FREET4", "T3FREE", "THYROIDAB" in the last 72 hours. Anemia Panel: No results for input(s): "VITAMINB12", "FOLATE", "FERRITIN", "TIBC", "IRON", "RETICCTPCT" in the last 72 hours. Sepsis Labs: No results for input(s): "PROCALCITON", "LATICACIDVEN" in the last 168 hours.   Recent Results (from the past 240 hour(s))  Culture, Respiratory w Gram Stain     Status: None   Collection Time: 08/12/22  9:12 AM   Specimen: Tracheal Aspirate; Respiratory  Result Value Ref Range Status   Specimen Description TRACHEAL ASPIRATE  Final   Special Requests NONE  Final   Gram Stain  Final    ABUNDANT WBC PRESENT, PREDOMINANTLY PMN NO ORGANISMS SEEN    Culture   Final    NO GROWTH 2 DAYS Performed at [facility], [address] [place], [place] [address]    Report Status 08/15/2022 FINAL  Final   Radiology Studies: No results found.  Scheduled Meds:  acetaminophen (TYLENOL) oral liquid 160 mg/5 mL  650 mg Per Tube Q6H   Or   acetaminophen  650 mg Rectal Q6H   aspirin  81 mg Per Tube Daily   atorvastatin  40 mg Per Tube Daily   carvedilol  6.25 mg Per Tube BID WC   Chlorhexidine Gluconate Cloth  6 each Topical Daily   citalopram  40 mg Per Tube Daily   docusate  100 mg Per Tube BID   feeding supplement (PROSource TF20)  60 mL Per Tube Daily   free water  100 mL Per Tube Q4H   heparin injection (subcutaneous)  5,000 Units Subcutaneous Q8H   insulin aspart  0-15 Units Subcutaneous Q4H   insulin aspart  8 Units Subcutaneous Q4H   insulin glargine-yfgn  10 Units Subcutaneous BID   multivitamin with minerals  1 tablet Per Tube Daily   nicotine  14 mg  Transdermal Daily   mouth rinse  15 mL Mouth Rinse 4 times per day   polyethylene glycol  17 g Per Tube Daily   QUEtiapine  75 mg Per Tube QHS   Continuous Infusions:  feeding supplement (VITAL 1.5 CAL) 1,000 mL (08/21/22 0630)     LOS: 14 days   Time spent:  Azucena Fallen, DO [facility]  If 7PM-7AM, please contact night-coverage [email or web address]  08/22/2022, 7:33 AM

## 2022-08-22 NOTE — TOC Initial Note (Signed)
Transition of Care [facility]) - Initial/Assessment Note    Patient Details  Name: Charles Fox MRN: [id number] Date of Birth: 04/17/1951  Transition of Care [facility] CM/SW Contact:    Tory Emerald, LCSW Phone Number: 08/22/2022, 1:04 PM  Clinical Narrative:                 CSW conversed with Pt's GOP via phone, Redmond Pulling, [phone] to discuss PT recommendations. GOP is in agreement to SNF. Pt currently resides in an ALF and will return to ALF post SNF placement. Will fax out when closer to d/c and cortrak removed.   Expected Discharge Plan: Skilled Nursing Facility Barriers to Discharge: Continued Medical Work up   Patient Goals and CMS Choice   CMS Medicare.gov Compare Post Acute Care list provided to:: Legal Guardian Redmond Pulling, [phone]) Choice offered to / list presented to :  (GOP)  Expected Discharge Plan and Services Expected Discharge Plan: Skilled Nursing Facility                                              Prior Living Arrangements/Services   Lives with:: Other (Comment) (AFL) Patient language and need for interpreter reviewed:: Yes Do you feel safe going back to the place where you live?: Yes      Need for Family Participation in Patient Care: Yes (Comment) Care giver support system in place?: Yes (comment)   Criminal Activity/Legal Involvement Pertinent to Current Situation/Hospitalization: No - Comment as needed  Activities of Daily Living Home Assistive Devices/Equipment: None ADL Screening (condition at time of admission) Patient's cognitive ability adequate to safely complete daily activities?: No Is the patient deaf or have difficulty hearing?: No Does the patient have difficulty seeing, even when wearing glasses/contacts?: No Does the patient have difficulty concentrating, remembering, or making decisions?: Yes Patient able to express need for assistance with ADLs?: No Does the patient have difficulty dressing or bathing?:  Yes Independently performs ADLs?: No Does the patient have difficulty walking or climbing stairs?: Yes Weakness of Legs: None  Permission Sought/Granted Permission sought to share information with : Guardian                Emotional Assessment           Psych Involvement: Outpatient Provider  Admission diagnosis:  Syncope [R55] Altered mental status, unspecified altered mental status type [R41.82] Patient Active Problem List   Diagnosis Date Noted   Sepsis due to Escherichia coli with acute renal failure and septic shock (HCC) 08/19/2022   E coli bacteremia 08/18/2022   Acute respiratory failure (HCC) 08/17/2022   Hypophosphatemia 08/16/2022   Encephalopathy acute 08/15/2022   Septic shock due to Escherichia coli (HCC) 08/15/2022   AKI (acute kidney injury) (HCC) 08/10/2022   Essential hypertension 08/10/2022   Syncope 08/08/2022   Altered mental status 08/08/2022   Closed fracture of nasal bones 08/08/2022   Controlled diabetes mellitus type 2 with complications (HCC) 08/08/2022   Fall 08/08/2022   PCP:  Pcp, No Pharmacy:  No Pharmacies Listed    Social Determinants of Health (SDOH) Interventions    Readmission Risk Interventions     No data to display

## 2022-08-22 NOTE — Progress Notes (Signed)
Speech Language Pathology Treatment: Dysphagia  Patient Details Name: Charles Fox MRN: [id number] DOB: 1951-07-09 Today's Date: 08/22/2022 Time: 3354-5625 SLP Time Calculation (min) (ACUTE ONLY): 13 min  Assessment / Plan / Recommendation Clinical Impression  Pt was seen for dysphagia treatment. He was notably more alert than when he was seen for the initial evaluation on 12/1. Mastication and oral clearance were adequate. Pt exhibited throat clearing with thin liquids, and coughing with regular texture solids, suggesting possible aspiration. Pt tolerated nectar thick liquids and puree without immediate s/s of aspiration, but prolonged coughing was noted at the end of the evaluation; SLP questions delayed sensation of aspiration or aspiration of pharyngeal residue. SLP recommends proceeding with an MBS prior to initiating a p.o. diet. This is tentatively scheduled for today at 1500.    HPI HPI: Pt is a 71 yo M admittted 11/20 after being found down outside of his group home. Pt found to have mild nasal fracture MRI brain 11/20 negative for acute changes. Pt seen by SLP 11/21 and 11/22; a dysphagia 2 diet with thin liquids initiated on 11/22. Pt developed progressively worsening lethargy, increasing hypotension, rising WBC, near obtundation, and SOB and was transferred to the ICU 11/23; ETT 11/23-11/30. Pt found to have sepsis, UTI with septic shock, and PNA. PMH: dementia, T2DM, Htn, HLD, CVA, housing insecurity, and tobacco use disorder. BSE 05/09/21: regular texture diet and thin liquids recommended without need for follow up.      SLP Plan  MBS      Recommendations for follow up therapy are one component of a multi-disciplinary discharge planning process, led by the attending physician.  Recommendations may be updated based on patient status, additional functional criteria and insurance authorization.    Recommendations  Diet recommendations: NPO (Ice chips allowed after oral  care) Medication Administration: Via alternative means                Oral Care Recommendations: Oral care QID;Oral care prior to ice chip/H20 Follow Up Recommendations: Skilled nursing-short term rehab (<3 hours/day) Assistance recommended at discharge: Frequent or constant Supervision/Assistance SLP Visit Diagnosis: Dysphagia, unspecified (R13.10) Plan: MBS         Charles Fox Clock, MS, CCC-SLP Acute Rehabilitation Services Office number [phone]   Charles Fox  08/22/2022, 10:17 AM

## 2022-08-23 LAB — GLUCOSE, CAPILLARY
Glucose-Capillary: 159 mg/dL — ABNORMAL HIGH (ref 70–99)
Glucose-Capillary: 151 mg/dL — ABNORMAL HIGH (ref 70–99)
Glucose-Capillary: 186 mg/dL — ABNORMAL HIGH (ref 70–99)
Glucose-Capillary: 191 mg/dL — ABNORMAL HIGH (ref 70–99)
Glucose-Capillary: 78 mg/dL (ref 70–99)
Glucose-Capillary: 117 mg/dL — ABNORMAL HIGH (ref 70–99)

## 2022-08-23 NOTE — Progress Notes (Signed)
Physical Therapy Treatment Patient Details Name: Charles Fox MRN: [id number] DOB: 1951/04/07 Today's Date: 08/23/2022   History of Present Illness Charles Fox is a 71 yo M admittted 11/20 after being found down outside of his group home.  PMH of dementia, T2DM, Htn, HLD, CVA, housing insecurity, and tobacco use disorder.  nasal fx noted on admission with worsening encephalopathy and resipiratory status.  Transferred to ICU intubated 11/23-30/23.  Now with sepsis, UTI with septic shock, and PNA.    PT Comments    Pt greeted sidelying in bed with NT present, awake and alert and eager for mobility with therapy. Pt requiring max assist for bed mobility with increased cueing needed for sequencing and trunk elevation. Pt able to come to stand with min a +2 to initiate power up, increasing to mod a +2 once standing as pt with strong posterior lean bracing legs against bed to maintain balance and heavy mod a +2 as pt steps away from bed to maintain balance. Pt with decreased safety awareness and decreased awareness of deficits throughout session and continues to be limited by impared balance/postural reactions, decreased strength and impaired cognition. Current plan remains appropriate to address deficits and maximize functional independence and decrease caregiver burden. Pt continues to benefit from skilled PT services to progress toward functional mobility goals.    Recommendations for follow up therapy are one component of a multi-disciplinary discharge planning process, led by the attending physician.  Recommendations may be updated based on patient status, additional functional criteria and insurance authorization.  Follow Up Recommendations  Skilled nursing-short term rehab (<3 hours/day) Can patient physically be transported by private vehicle: No   Assistance Recommended at Discharge Frequent or constant Supervision/Assistance  Patient can return home with the following Assistance  with cooking/housework;Assist for transportation;Help with stairs or ramp for entrance;A lot of help with walking and/or transfers;A lot of help with bathing/dressing/bathroom;Assistance with feeding;Direct supervision/assist for financial management   Equipment Recommendations  Other (comment)    Recommendations for Other Services       Precautions / Restrictions Precautions Precautions: Fall Precaution Comments: tube feed Restrictions Weight Bearing Restrictions: No     Mobility  Bed Mobility Overal bed mobility: Needs Assistance Bed Mobility: Rolling, Sidelying to Sit Rolling: Max assist Sidelying to sit: Mod assist, +2 for physical assistance, +2 for safety/equipment   Sit to supine: Mod assist (impulsive "I am done" and performed very suddenly, mod A  for legs and repositioning in supine)   General bed mobility comments: max vc for sequencing, assist with BLE and trunk elevation    Transfers Overall transfer level: Needs assistance Equipment used: 2 person hand held assist (face to face) Transfers: Sit to/from Stand Sit to Stand: Mod assist, +2 physical assistance, +2 safety/equipment           General transfer comment: Pt with heavy dependence on support on back of legs from bed. when pushing up from bed, he is able to initiate with min A +2; but when he steps away from bed quickly becomes mod A +2. Posterior lean and max cues to come fully upright.    Ambulation/Gait                   Stairs             Wheelchair Mobility    Modified Rankin (Stroke Patients Only)       Balance Overall balance assessment: Needs assistance Sitting-balance support: Single extremity supported, Feet supported Sitting  balance-Leahy Scale: Fair Sitting balance - Comments: min guard with LOB x3 posterior - also chronic back pain and restless in seated position Postural control: Posterior lean Standing balance support: Bilateral upper extremity supported, During  functional activity Standing balance-Leahy Scale: Poor                              Cognition Arousal/Alertness: Awake/alert Behavior During Therapy: Restless, Flat affect Overall Cognitive Status: No family/caregiver present to determine baseline cognitive functioning Area of Impairment: Orientation, Attention, Memory, Following commands, Safety/judgement, Awareness, Problem solving                 Orientation Level: Disoriented to, Situation, Place, Time Current Attention Level: Focused Memory: Decreased short-term memory Following Commands: Follows one step commands with increased time Safety/Judgement: Decreased awareness of safety, Decreased awareness of deficits Awareness: Intellectual Problem Solving: Slow processing, Decreased initiation, Requires verbal cues, Difficulty sequencing, Requires tactile cues General Comments: Pt with known diagnosis of dementia, Pt cooperative - decreased initiation and will suddenly stop participating "I'm done"        Exercises Other Exercises Other Exercises: attempted marching and LAQ with pt deoming difficulty sequencing    General Comments General comments (skin integrity, edema, etc.): VSS on RA      Pertinent Vitals/Pain Pain Assessment Pain Assessment: Faces Faces Pain Scale: Hurts even more Pain Location: back (chronic) Pain Descriptors / Indicators: Grimacing, Sore, Aching Pain Intervention(s): Monitored during session, Limited activity within patient's tolerance    Home Living                          Prior Function            PT Goals (current goals can now be found in the care plan section) Acute Rehab PT Goals PT Goal Formulation: With patient Time For Goal Achievement: 08/22/22 Progress towards PT goals: Progressing toward goals    Frequency    Min 3X/week      PT Plan      Co-evaluation PT/OT/SLP Co-Evaluation/Treatment: Yes Reason for Co-Treatment: Complexity of the  patient's impairments (multi-system involvement);For patient/therapist safety PT goals addressed during session: Mobility/safety with mobility;Balance;Strengthening/ROM OT goals addressed during session: ADL's and self-care      AM-PAC PT "6 Clicks" Mobility   Outcome Measure  Help needed turning from your back to your side while in a flat bed without using bedrails?: Total Help needed moving from lying on your back to sitting on the side of a flat bed without using bedrails?: Total Help needed moving to and from a bed to a chair (including a wheelchair)?: Total Help needed standing up from a chair using your arms (e.g., wheelchair or bedside chair)?: Total Help needed to walk in hospital room?: Total Help needed climbing 3-5 steps with a railing? : Total 6 Click Score: 6    End of Session Equipment Utilized During Treatment: Gait belt Activity Tolerance: Patient tolerated treatment well Patient left: in bed;with call bell/phone within reach;with bed alarm set Nurse Communication: Mobility status PT Visit Diagnosis: Unsteadiness on feet (R26.81);Muscle weakness (generalized) (M62.81)     Time: 0865-7846 PT Time Calculation (min) (ACUTE ONLY): 31 min  Charges:  $Therapeutic Activity: 8-22 mins                     Layken Doenges R. PTA Acute Rehabilitation Services Office: [phone]   Catalina Antigua 08/23/2022, 11:56 AM

## 2022-08-23 NOTE — Progress Notes (Signed)
Occupational Therapy Treatment Patient Details Name: Charles Fox MRN: [id number] DOB: 05-Apr-1951 Today's Date: 08/23/2022   History of present illness [name] is a 71 yo M admittted 11/20 after being found down outside of his group home.  PMH of dementia, T2DM, Htn, HLD, CVA, housing insecurity, and tobacco use disorder.  nasal fx noted on admission with worsening encephalopathy and resipiratory status.  Transferred to ICU intubated 11/23-30/23.  Now with sepsis, UTI with septic shock, and PNA.   OT comments  Pt more alert today. Pt eager to move with therapy. Finishing up full bath with RN staff (NT) Pt restless and slightly impulsive. Total A for LB dressing, Max A for bed mobility - needed a lot of help with sequencing and trunk elevation. Once sitting EOB. Once sitting EOB needed max cues to scoot forward (ultimately needed mod A with use of pads to complete scoot) posterior LOB multiple times while sitting EOB requiring min A to correct. Pt with decreased safety awareness of decreased balance. Pt required mod A during oral care while seated EOB - provided pre-set up toothbrush, initially hand over hand to initiate and cues to spit out extra fluids. Pt able to perform multiple sit<>stand from EOB - dependent on support on back of legs for sit<>stand - when he steps away from the bed, he suddenly requires heavy mod A+2. OT will continue to follow acutely, POC remains appropriate at this time.  Note: Pt enjoyed motown music during session.    Recommendations for follow up therapy are one component of a multi-disciplinary discharge planning process, led by the attending physician.  Recommendations may be updated based on patient status, additional functional criteria and insurance authorization.    Follow Up Recommendations  Skilled nursing-short term rehab (<3 hours/day)     Assistance Recommended at Discharge Frequent or constant Supervision/Assistance  Patient can return home  with the following  Direct supervision/assist for medications management;Direct supervision/assist for financial management;Assistance with cooking/housework;Assist for transportation;Help with stairs or ramp for entrance;Two people to help with walking and/or transfers;Two people to help with bathing/dressing/bathroom   Equipment Recommendations  Other (comment) (defer to next venue)    Recommendations for Other Services PT consult;Speech consult    Precautions / Restrictions Precautions Precautions: Fall Precaution Comments: tube feed Restrictions Weight Bearing Restrictions: No       Mobility Bed Mobility Overal bed mobility: Needs Assistance Bed Mobility: Rolling, Sidelying to Sit Rolling: Max assist Sidelying to sit: Mod assist, +2 for physical assistance, +2 for safety/equipment   Sit to supine: Mod assist (impulsive "I am done" and performed very suddenly, mod A  for legs and repositioning in supine)   General bed mobility comments: max vc for sequencing, assist with BLE and trunk elevation    Transfers Overall transfer level: Needs assistance Equipment used: 2 person hand held assist (face to face) Transfers: Sit to/from Stand Sit to Stand: Mod assist, +2 physical assistance, +2 safety/equipment           General transfer comment: Pt with heavy dependence on support on back of legs from bed. when pushing up from bed, he is able to initiate with min A +2; but when he steps away from bed quickly becomes mod A +2. Posterior lean and max cues to come fully upright.     Balance Overall balance assessment: Needs assistance Sitting-balance support: Single extremity supported, Feet supported Sitting balance-Leahy Scale: Fair Sitting balance - Comments: min guard with LOB x3 posterior - also chronic  back pain and restless in seated position Postural control: Posterior lean Standing balance support: Bilateral upper extremity supported, During functional activity Standing  balance-Leahy Scale: Poor                             ADL either performed or assessed with clinical judgement   ADL Overall ADL's : Needs assistance/impaired Eating/Feeding: NPO   Grooming: Maximal assistance;Oral care;Wash/dry face;Sitting Grooming Details (indicate cue type and reason): posterior LOB x2, requires mod A during oral care for hand over hand initially, cues for spitting out extra fluids from oral care Upper Body Bathing: Maximal assistance   Lower Body Bathing: Total assistance;Bed level   Upper Body Dressing : Moderate assistance;Sitting Upper Body Dressing Details (indicate cue type and reason): donning gown Lower Body Dressing: Total assistance;Bed level   Toilet Transfer: Moderate assistance;+2 for physical assistance;+2 for safety/equipment   Toileting- Clothing Manipulation and Hygiene: Maximal assistance;+2 for physical assistance;+2 for safety/equipment;Sit to/from stand Toileting - Clothing Manipulation Details (indicate cue type and reason): 3rd person performing peri care from behind with max A +2 for partial stand     Functional mobility during ADLs: Moderate assistance;+2 for physical assistance;+2 for safety/equipment (steps up the bed)      Extremity/Trunk Assessment Upper Extremity Assessment Upper Extremity Assessment: Generalized weakness   Lower Extremity Assessment Lower Extremity Assessment: Defer to PT evaluation        Vision       Perception     Praxis      Cognition Arousal/Alertness: Awake/alert Behavior During Therapy: Restless, Flat affect Overall Cognitive Status: No family/caregiver present to determine baseline cognitive functioning Area of Impairment: Orientation, Attention, Memory, Following commands, Safety/judgement, Awareness, Problem solving                 Orientation Level: Disoriented to, Situation, Place, Time Current Attention Level: Focused Memory: Decreased short-term memory Following  Commands: Follows one step commands with increased time Safety/Judgement: Decreased awareness of safety, Decreased awareness of deficits Awareness: Intellectual Problem Solving: Slow processing, Decreased initiation, Requires verbal cues, Difficulty sequencing, Requires tactile cues General Comments: Pt with known diagnosis of dementia, Pt cooperative - decreased initiation and will suddenly stop participating "I'm done"        Exercises      Shoulder Instructions       General Comments      Pertinent Vitals/ Pain       Pain Assessment Pain Assessment: Faces Faces Pain Scale: Hurts even more Pain Location: back (chronic) Pain Descriptors / Indicators: Grimacing, Sore, Aching Pain Intervention(s): Monitored during session, Repositioned  Home Living                                          Prior Functioning/Environment              Frequency  Min 2X/week        Progress Toward Goals  OT Goals(current goals can now be found in the care plan section)  Progress towards OT goals: Progressing toward goals  Acute Rehab OT Goals Patient Stated Goal: none stated Time For Goal Achievement: 08/29/22 Potential to Achieve Goals: Fair  Plan Discharge plan remains appropriate    Co-evaluation    PT/OT/SLP Co-Evaluation/Treatment: Yes Reason for Co-Treatment: Necessary to address cognition/behavior during functional activity;For patient/therapist safety;To address functional/ADL transfers PT goals addressed  during session: Mobility/safety with mobility;Balance;Strengthening/ROM OT goals addressed during session: ADL's and self-care      AM-PAC OT "6 Clicks" Daily Activity     Outcome Measure   Help from another person eating meals?: Total (NPO) Help from another person taking care of personal grooming?: A Lot Help from another person toileting, which includes using toliet, bedpan, or urinal?: A Lot Help from another person bathing (including  washing, rinsing, drying)?: A Lot Help from another person to put on and taking off regular upper body clothing?: A Lot Help from another person to put on and taking off regular lower body clothing?: A Lot 6 Click Score: 11    End of Session Equipment Utilized During Treatment: Gait belt  OT Visit Diagnosis: Unsteadiness on feet (R26.81);History of falling (Z91.81);Other symptoms and signs involving cognitive function;Muscle weakness (generalized) (M62.81)   Activity Tolerance Patient tolerated treatment well   Patient Left in bed;with call bell/phone within reach;with restraints reapplied;with bed alarm set   Nurse Communication Mobility status        Time: 1610-9604 OT Time Calculation (min): 31 min  Charges: OT General Charges $OT Visit: 1 Visit OT Treatments $Self Care/Home Management : 8-22 mins $Therapeutic Activity: 8-22 mins  Nyoka Cowden OTR/L Acute Rehabilitation Services Office: [phone]  Evern [facility] 08/23/2022, 10:37 AM

## 2022-08-23 NOTE — Progress Notes (Signed)
PROGRESS NOTE    Charles Fox  GQQ:[id number] DOB: 10/02/1950 DOA: 08/08/2022 PCP: Pcp, No   Brief Narrative:  71 year old man hx of DM2, HTN, HLD, CVA, dementia [place] in SNF [facility] [place] [facility] presenting with AMS questionable fall and seizure. Nasal fx on imaging. Neuro workup neg (MRI/EEG), question raised of progressive dementia or hypertensive encephalopathy. Worsening lethargy starting yesterday and progressive through day now more hypotensive, rising WBC, near obtundation, SOB prompting ICU eval.  08/08/22 admit 11/22 worsening mental status 11/23 ICU consult, intubated, started on pressors for profound shock with lactic acidosis. 11/24 Pressor requirements improved 11/29 weaning on vent but variable mentaiton 11/30 extubated 12/2 transfer to [facility] 12/3 -present: Mental status improving likely back to baseline -continue to advance diet once able to tolerate appropriate caloric intake NG tube can be removed and patient can return to previous facility.  Patient medically stable at this time for discharge but unable to transfer with NG tube in place.  Assessment & Plan:   Principal Problem:   Syncope Active Problems:   Altered mental status   Closed fracture of nasal bones   Controlled diabetes mellitus type 2 with complications (HCC)   Fall   AKI (acute kidney injury) (HCC)   Essential hypertension   Encephalopathy acute   Septic shock due to Escherichia coli (HCC)   Hypophosphatemia   Acute respiratory failure (HCC)   E coli bacteremia   Sepsis due to Escherichia coli with acute renal failure and septic shock (HCC)  Acute metabolic encephalopathy superimposed on dementia -resolved -Workup including MRI has been negative. Likely due to septic shock, icu delirium. -Delirium precautions ongoing but patient's mental status continues to improve, likely at baseline at this point given his baseline dementia per caretaker.   HCAP, aspiration PNA, resolving Dysphagia,  improving -Completed ancef course -Speech evaluation ongoing, advance diet as tolerated currently on dysphagia 2 nectar thick liquids   Septic shock w multisystem organ failure (resp failure, liver dysfunction, AKI, improved, encephalopathy)  due to ecoli bacteremia, HCAP, UTI - shock resolved -Completed ancef course  Urinary retention  -[place] w no hydronephrosis -Continue urecholine -Foley out 12/1 -difficulty voiding over the weekend, Foley replaced on 12/3 due to obstruction and ongoing urinary retention.  Will likely need outpatient follow-up with urology if unable to wean off of Foley catheter prior to discharge   Transaminitis  Likely secondary to septic shock -CMP stable - follow PRN   DM2, Insulin dependent, uncontrolled with hyperglycemia -semglee 10 units BID, SSI q4, EN coverage q4  -adjust per diet Lab Results  Component Value Date   HGBA1C 6.6 (H) 08/08/2022   HTN HLD -Continue coreg -PRN hydralazine -ASA, statin ongoing   Anemia, acute secondary to critical illness  -Trend cbc PRN   Inadequate PO intake Hypophosphatemia -Follow p.o. intake and NG tube feeds as above   Goals of care -[facility]ard [facility]  -Will continue to update care taker and family -Full code   DVT prophylaxis: heparin Code Status: Full Family Communication: Ward [facility] state -caretaker updated over the phone  Status is: Inpt  Dispo: The patient is from: Facility              Anticipated d/c is to: Same              Anticipated d/c date is: 48-72h              Patient currently NOT medically stable for discharge given ongoing need for NG tube  Evaluation: Type of Study: MBS-Modified Barium Swallow Study  Patient Details Name: Charles Fox MRN: [id number] Date of Birth: 06-01-1951 Today's Date: 08/22/2022 Time: SLP Start Time (ACUTE ONLY): 1508 -SLP Stop Time (ACUTE ONLY): 1532 SLP Time Calculation (min) (ACUTE ONLY): 24 min Past Medical History: No past medical history on file. Past Surgical History: No past surgical history on file. HPI: Pt is a 71 yo M admittted 11/20 after being found down outside of his group home. Pt found to have mild nasal fracture MRI brain 11/20 negative for acute changes. Pt seen by SLP 11/21 and 11/22; a dysphagia 2 diet with thin liquids initiated on 11/22. Pt developed progressively worsening lethargy, increasing hypotension, rising WBC, near obtundation, and SOB and was transferred to the ICU 11/23; ETT 11/23-11/30.  Pt found to have sepsis, UTI with septic shock, and PNA. PMH: dementia, T2DM, Htn, HLD, CVA, housing insecurity, and tobacco use disorder. BSE 05/09/21: regular texture diet and thin liquids recommended without need for follow up.  Subjective: pt in bed, asleep - woke and indicated he wanted food/drink  Recommendations for follow up therapy are one component of a multi-disciplinary discharge planning process, led by the attending physician.  Recommendations may be updated based on patient status, additional functional criteria and insurance authorization. Assessment / Plan / Recommendation   08/22/2022   4:12 PM Clinical Impressions Clinical Impression Pt presents with oropharyngeal dysphagia characterized by reduced posterior propulsion, weak lingual manipulation, reduced bolus cohesion and a pharyngeal delay. He demonstrated lingual pumping, difficulty with A-P transport of the barium tablet, and premature spillage to the valleculae and pyriform sinuses. Penetration (PAS 2,3) and silent aspiration (PAS 8) were noted with thin liquids. Transient penetration (PAS 2; WNL) was observed with consecutive swallows of nectar thick liquids via straw. Prompted coughing was ineffective in expelling aspirated material. Use of individual boluses of thin liquids via cup/straw as well as use of a chin tuck posture inconsistently improved laryngeal invasion to PAS 5, but aspiration was noted thereafter. A dysphagia 2 diet with nectar thick liquids is recommended at this time. SLP will follow for dysphagia treatment. SLP Visit Diagnosis Dysphagia, oropharyngeal phase (R13.12) Impact on safety and function Mild aspiration risk     08/22/2022   4:12 PM Treatment Recommendations Treatment Recommendations Therapy as outlined in treatment plan below     08/22/2022   4:12 PM Prognosis Prognosis for Safe Diet Advancement Good Barriers to Reach Goals Cognitive deficits   08/22/2022   4:12 PM Diet Recommendations SLP Diet Recommendations  Dysphagia 2 (Fine chop) solids;Nectar thick liquid Liquid Administration via Cup;Straw Medication Administration Crushed with puree Compensations Slow rate;Small sips/bites Postural Changes Seated upright at 90 degrees     08/22/2022   4:12 PM Other Recommendations Oral Care Recommendations Oral care BID Follow Up Recommendations Skilled nursing-short term rehab (<3 hours/day) Functional Status Assessment Patient has had a recent decline in their functional status and demonstrates the ability to make significant improvements in function in a reasonable and predictable amount of time.   08/22/2022   4:12 PM Frequency and Duration  Speech Therapy Frequency (ACUTE ONLY) min 2x/week Treatment Duration 2 weeks     08/22/2022   4:12 PM Oral Phase Oral Phase Impaired Oral - Nectar Cup Reduced posterior propulsion;Lingual pumping;Weak lingual manipulation;Decreased bolus cohesion;Premature spillage Oral - Nectar Straw Reduced posterior propulsion;Lingual pumping;Weak lingual manipulation;Decreased bolus cohesion;Premature spillage Oral - Thin Cup Reduced posterior propulsion;Lingual pumping;Weak lingual manipulation;Decreased bolus cohesion;Premature spillage Oral - Thin Straw Reduced posterior propulsion;Lingual pumping;Weak  Evaluation: Type of Study: MBS-Modified Barium Swallow Study  Patient Details Name: Charles Fox MRN: [id number] Date of Birth: 06-01-1951 Today's Date: 08/22/2022 Time: SLP Start Time (ACUTE ONLY): 1508 -SLP Stop Time (ACUTE ONLY): 1532 SLP Time Calculation (min) (ACUTE ONLY): 24 min Past Medical History: No past medical history on file. Past Surgical History: No past surgical history on file. HPI: Pt is a 71 yo M admittted 11/20 after being found down outside of his group home. Pt found to have mild nasal fracture MRI brain 11/20 negative for acute changes. Pt seen by SLP 11/21 and 11/22; a dysphagia 2 diet with thin liquids initiated on 11/22. Pt developed progressively worsening lethargy, increasing hypotension, rising WBC, near obtundation, and SOB and was transferred to the ICU 11/23; ETT 11/23-11/30.  Pt found to have sepsis, UTI with septic shock, and PNA. PMH: dementia, T2DM, Htn, HLD, CVA, housing insecurity, and tobacco use disorder. BSE 05/09/21: regular texture diet and thin liquids recommended without need for follow up.  Subjective: pt in bed, asleep - woke and indicated he wanted food/drink  Recommendations for follow up therapy are one component of a multi-disciplinary discharge planning process, led by the attending physician.  Recommendations may be updated based on patient status, additional functional criteria and insurance authorization. Assessment / Plan / Recommendation   08/22/2022   4:12 PM Clinical Impressions Clinical Impression Pt presents with oropharyngeal dysphagia characterized by reduced posterior propulsion, weak lingual manipulation, reduced bolus cohesion and a pharyngeal delay. He demonstrated lingual pumping, difficulty with A-P transport of the barium tablet, and premature spillage to the valleculae and pyriform sinuses. Penetration (PAS 2,3) and silent aspiration (PAS 8) were noted with thin liquids. Transient penetration (PAS 2; WNL) was observed with consecutive swallows of nectar thick liquids via straw. Prompted coughing was ineffective in expelling aspirated material. Use of individual boluses of thin liquids via cup/straw as well as use of a chin tuck posture inconsistently improved laryngeal invasion to PAS 5, but aspiration was noted thereafter. A dysphagia 2 diet with nectar thick liquids is recommended at this time. SLP will follow for dysphagia treatment. SLP Visit Diagnosis Dysphagia, oropharyngeal phase (R13.12) Impact on safety and function Mild aspiration risk     08/22/2022   4:12 PM Treatment Recommendations Treatment Recommendations Therapy as outlined in treatment plan below     08/22/2022   4:12 PM Prognosis Prognosis for Safe Diet Advancement Good Barriers to Reach Goals Cognitive deficits   08/22/2022   4:12 PM Diet Recommendations SLP Diet Recommendations  Dysphagia 2 (Fine chop) solids;Nectar thick liquid Liquid Administration via Cup;Straw Medication Administration Crushed with puree Compensations Slow rate;Small sips/bites Postural Changes Seated upright at 90 degrees     08/22/2022   4:12 PM Other Recommendations Oral Care Recommendations Oral care BID Follow Up Recommendations Skilled nursing-short term rehab (<3 hours/day) Functional Status Assessment Patient has had a recent decline in their functional status and demonstrates the ability to make significant improvements in function in a reasonable and predictable amount of time.   08/22/2022   4:12 PM Frequency and Duration  Speech Therapy Frequency (ACUTE ONLY) min 2x/week Treatment Duration 2 weeks     08/22/2022   4:12 PM Oral Phase Oral Phase Impaired Oral - Nectar Cup Reduced posterior propulsion;Lingual pumping;Weak lingual manipulation;Decreased bolus cohesion;Premature spillage Oral - Nectar Straw Reduced posterior propulsion;Lingual pumping;Weak lingual manipulation;Decreased bolus cohesion;Premature spillage Oral - Thin Cup Reduced posterior propulsion;Lingual pumping;Weak lingual manipulation;Decreased bolus cohesion;Premature spillage Oral - Thin Straw Reduced posterior propulsion;Lingual pumping;Weak  Evaluation: Type of Study: MBS-Modified Barium Swallow Study  Patient Details Name: Charles Fox MRN: [id number] Date of Birth: 06-01-1951 Today's Date: 08/22/2022 Time: SLP Start Time (ACUTE ONLY): 1508 -SLP Stop Time (ACUTE ONLY): 1532 SLP Time Calculation (min) (ACUTE ONLY): 24 min Past Medical History: No past medical history on file. Past Surgical History: No past surgical history on file. HPI: Pt is a 71 yo M admittted 11/20 after being found down outside of his group home. Pt found to have mild nasal fracture MRI brain 11/20 negative for acute changes. Pt seen by SLP 11/21 and 11/22; a dysphagia 2 diet with thin liquids initiated on 11/22. Pt developed progressively worsening lethargy, increasing hypotension, rising WBC, near obtundation, and SOB and was transferred to the ICU 11/23; ETT 11/23-11/30.  Pt found to have sepsis, UTI with septic shock, and PNA. PMH: dementia, T2DM, Htn, HLD, CVA, housing insecurity, and tobacco use disorder. BSE 05/09/21: regular texture diet and thin liquids recommended without need for follow up.  Subjective: pt in bed, asleep - woke and indicated he wanted food/drink  Recommendations for follow up therapy are one component of a multi-disciplinary discharge planning process, led by the attending physician.  Recommendations may be updated based on patient status, additional functional criteria and insurance authorization. Assessment / Plan / Recommendation   08/22/2022   4:12 PM Clinical Impressions Clinical Impression Pt presents with oropharyngeal dysphagia characterized by reduced posterior propulsion, weak lingual manipulation, reduced bolus cohesion and a pharyngeal delay. He demonstrated lingual pumping, difficulty with A-P transport of the barium tablet, and premature spillage to the valleculae and pyriform sinuses. Penetration (PAS 2,3) and silent aspiration (PAS 8) were noted with thin liquids. Transient penetration (PAS 2; WNL) was observed with consecutive swallows of nectar thick liquids via straw. Prompted coughing was ineffective in expelling aspirated material. Use of individual boluses of thin liquids via cup/straw as well as use of a chin tuck posture inconsistently improved laryngeal invasion to PAS 5, but aspiration was noted thereafter. A dysphagia 2 diet with nectar thick liquids is recommended at this time. SLP will follow for dysphagia treatment. SLP Visit Diagnosis Dysphagia, oropharyngeal phase (R13.12) Impact on safety and function Mild aspiration risk     08/22/2022   4:12 PM Treatment Recommendations Treatment Recommendations Therapy as outlined in treatment plan below     08/22/2022   4:12 PM Prognosis Prognosis for Safe Diet Advancement Good Barriers to Reach Goals Cognitive deficits   08/22/2022   4:12 PM Diet Recommendations SLP Diet Recommendations  Dysphagia 2 (Fine chop) solids;Nectar thick liquid Liquid Administration via Cup;Straw Medication Administration Crushed with puree Compensations Slow rate;Small sips/bites Postural Changes Seated upright at 90 degrees     08/22/2022   4:12 PM Other Recommendations Oral Care Recommendations Oral care BID Follow Up Recommendations Skilled nursing-short term rehab (<3 hours/day) Functional Status Assessment Patient has had a recent decline in their functional status and demonstrates the ability to make significant improvements in function in a reasonable and predictable amount of time.   08/22/2022   4:12 PM Frequency and Duration  Speech Therapy Frequency (ACUTE ONLY) min 2x/week Treatment Duration 2 weeks     08/22/2022   4:12 PM Oral Phase Oral Phase Impaired Oral - Nectar Cup Reduced posterior propulsion;Lingual pumping;Weak lingual manipulation;Decreased bolus cohesion;Premature spillage Oral - Nectar Straw Reduced posterior propulsion;Lingual pumping;Weak lingual manipulation;Decreased bolus cohesion;Premature spillage Oral - Thin Cup Reduced posterior propulsion;Lingual pumping;Weak lingual manipulation;Decreased bolus cohesion;Premature spillage Oral - Thin Straw Reduced posterior propulsion;Lingual pumping;Weak

## 2022-08-23 NOTE — Progress Notes (Signed)
Attempted to feed patient dinner but he continued to cough with each bite.  Patients head was elevated but after he swallowed he immediately coughed afterwards.  Dr. Natale Milch has been notified.  Will hold off on feeding patient at this time.

## 2022-08-24 DIAGNOSIS — G9341 Metabolic encephalopathy: Secondary | ICD-10-CM

## 2022-08-24 DIAGNOSIS — R1312 Dysphagia, oropharyngeal phase: Secondary | ICD-10-CM

## 2022-08-24 LAB — GLUCOSE, CAPILLARY
Glucose-Capillary: 125 mg/dL — ABNORMAL HIGH (ref 70–99)
Glucose-Capillary: 139 mg/dL — ABNORMAL HIGH (ref 70–99)
Glucose-Capillary: 157 mg/dL — ABNORMAL HIGH (ref 70–99)
Glucose-Capillary: 186 mg/dL — ABNORMAL HIGH (ref 70–99)
Glucose-Capillary: 193 mg/dL — ABNORMAL HIGH (ref 70–99)
Glucose-Capillary: 213 mg/dL — ABNORMAL HIGH (ref 70–99)

## 2022-08-24 NOTE — Progress Notes (Addendum)
[facility] PROGRESS NOTE   MARTINA GIBBY [id number] DOB: 05-31-1951 DOA: 08/08/2022  PCP: Pcp, No  Brief History/Interval Summary: 71 year old man hx of DM2, HTN, HLD, CVA, dementia resides in SNF, ward of the state, presented with AMS, questionable fall and seizure. Nasal fx on imaging. Neuro workup neg (MRI/EEG), question raised of progressive dementia or hypertensive encephalopathy.   Hospital Course:    08/08/22 admit 11/22 worsening mental status 11/23 ICU consult, intubated, started on pressors for profound shock with lactic acidosis. 11/24 Pressor requirements improved 11/29 weaning on vent but variable mentaiton 11/30 extubated 12/2 transfer to [facility]  Consultants: Critical care medicine    Subjective/Interval History: Patient awake.  Mildly distracted.  Denies any pain issues.    Assessment/Plan:  Acute metabolic encephalopathy, superimposed on dementia -Workup including MRI has been negative. Likely due to septic shock, icu delirium. -Delirium precautions ongoing. Patient's mentation has been improving.  He does have underlying dementia.  Could be at baseline.   HCAP, aspiration PNA Patient has completed course of antibiotics, Ancef.  Respiratory status noted to be stable.  Saturating normal on room air. Aspiration precautions.  Oropharyngeal dysphagia Following.  Currently on dysphagia 1 diet.  Some aspiration noted overnight.  Currently also on tube feedings.   Septic shock w multisystem organ failure (resp failure, liver dysfunction, AKI, improved, encephalopathy) Due to ecoli bacteremia, HCAP, UTI - shock resolved   Urinary retention  -[place] w no hydronephrosis -Continue urecholine -Foley out 12/1 -difficulty voiding over the weekend, Foley replaced on 12/3 due to obstruction and ongoing urinary retention.   Will likely need outpatient follow-up with urology if unable to wean off of Foley catheter prior to discharge   Transaminitis  Likely  secondary to septic shock -CMP stable - follow PRN   DM2, Insulin dependent, uncontrolled with hyperglycemia HbA1c 6.6.  Currently on glargine and SSI.  Monitor CBGs.  Essential hypertension/hyperlipidemia  Continue coreg -PRN hydralazine -ASA, statin ongoing   Anemia, acute secondary to critical illness  No evidence of overt bleeding.  Monitor periodically.   Goals of care -Ward of state  Currently on tube feedings.  Noted to have oropharyngeal dysphagia.  Patient may benefit from palliative care consultation to determine goals of care especially if his dysphagia does not improve.  Obesity Estimated body mass index is 31.01 kg/m as calculated from the following:   Height as of this encounter: 6' (1.829 m).   Weight as of this encounter: 103.7 kg.  DVT Prophylaxis: Subcutaneous heparin Code Status: Full code Family Communication: No family at bedside Disposition Plan: To be determined.  SNF is recommended  Status is: Inpatient Remains inpatient appropriate because: Oropharyngeal dysphagia requiring tube feedings      Medications: Scheduled:  acetaminophen (TYLENOL) oral liquid 160 mg/5 mL  650 mg Per Tube Q6H   Or   acetaminophen  650 mg Rectal Q6H   aspirin  81 mg Per Tube Daily   atorvastatin  40 mg Per Tube Daily   carvedilol  6.25 mg Per Tube BID WC   Chlorhexidine Gluconate Cloth  6 each Topical Daily   citalopram  40 mg Per Tube Daily   docusate  100 mg Per Tube BID   feeding supplement (PROSource TF20)  60 mL Per Tube Daily   free water  100 mL Per Tube Q4H   heparin injection (subcutaneous)  5,000 Units Subcutaneous Q8H   insulin aspart  0-15 Units Subcutaneous Q4H   insulin aspart  8 Units Subcutaneous  [facility] PROGRESS NOTE   MARTINA GIBBY [id number] DOB: 05-31-1951 DOA: 08/08/2022  PCP: Pcp, No  Brief History/Interval Summary: 71 year old man hx of DM2, HTN, HLD, CVA, dementia resides in SNF, ward of the state, presented with AMS, questionable fall and seizure. Nasal fx on imaging. Neuro workup neg (MRI/EEG), question raised of progressive dementia or hypertensive encephalopathy.   Hospital Course:    08/08/22 admit 11/22 worsening mental status 11/23 ICU consult, intubated, started on pressors for profound shock with lactic acidosis. 11/24 Pressor requirements improved 11/29 weaning on vent but variable mentaiton 11/30 extubated 12/2 transfer to [facility]  Consultants: Critical care medicine    Subjective/Interval History: Patient awake.  Mildly distracted.  Denies any pain issues.    Assessment/Plan:  Acute metabolic encephalopathy, superimposed on dementia -Workup including MRI has been negative. Likely due to septic shock, icu delirium. -Delirium precautions ongoing. Patient's mentation has been improving.  He does have underlying dementia.  Could be at baseline.   HCAP, aspiration PNA Patient has completed course of antibiotics, Ancef.  Respiratory status noted to be stable.  Saturating normal on room air. Aspiration precautions.  Oropharyngeal dysphagia Following.  Currently on dysphagia 1 diet.  Some aspiration noted overnight.  Currently also on tube feedings.   Septic shock w multisystem organ failure (resp failure, liver dysfunction, AKI, improved, encephalopathy) Due to ecoli bacteremia, HCAP, UTI - shock resolved   Urinary retention  -[place] w no hydronephrosis -Continue urecholine -Foley out 12/1 -difficulty voiding over the weekend, Foley replaced on 12/3 due to obstruction and ongoing urinary retention.   Will likely need outpatient follow-up with urology if unable to wean off of Foley catheter prior to discharge   Transaminitis  Likely  secondary to septic shock -CMP stable - follow PRN   DM2, Insulin dependent, uncontrolled with hyperglycemia HbA1c 6.6.  Currently on glargine and SSI.  Monitor CBGs.  Essential hypertension/hyperlipidemia  Continue coreg -PRN hydralazine -ASA, statin ongoing   Anemia, acute secondary to critical illness  No evidence of overt bleeding.  Monitor periodically.   Goals of care -Ward of state  Currently on tube feedings.  Noted to have oropharyngeal dysphagia.  Patient may benefit from palliative care consultation to determine goals of care especially if his dysphagia does not improve.  Obesity Estimated body mass index is 31.01 kg/m as calculated from the following:   Height as of this encounter: 6' (1.829 m).   Weight as of this encounter: 103.7 kg.  DVT Prophylaxis: Subcutaneous heparin Code Status: Full code Family Communication: No family at bedside Disposition Plan: To be determined.  SNF is recommended  Status is: Inpatient Remains inpatient appropriate because: Oropharyngeal dysphagia requiring tube feedings      Medications: Scheduled:  acetaminophen (TYLENOL) oral liquid 160 mg/5 mL  650 mg Per Tube Q6H   Or   acetaminophen  650 mg Rectal Q6H   aspirin  81 mg Per Tube Daily   atorvastatin  40 mg Per Tube Daily   carvedilol  6.25 mg Per Tube BID WC   Chlorhexidine Gluconate Cloth  6 each Topical Daily   citalopram  40 mg Per Tube Daily   docusate  100 mg Per Tube BID   feeding supplement (PROSource TF20)  60 mL Per Tube Daily   free water  100 mL Per Tube Q4H   heparin injection (subcutaneous)  5,000 Units Subcutaneous Q8H   insulin aspart  0-15 Units Subcutaneous Q4H   insulin aspart  8 Units Subcutaneous  [facility] PROGRESS NOTE   MARTINA GIBBY [id number] DOB: 05-31-1951 DOA: 08/08/2022  PCP: Pcp, No  Brief History/Interval Summary: 71 year old man hx of DM2, HTN, HLD, CVA, dementia resides in SNF, ward of the state, presented with AMS, questionable fall and seizure. Nasal fx on imaging. Neuro workup neg (MRI/EEG), question raised of progressive dementia or hypertensive encephalopathy.   Hospital Course:    08/08/22 admit 11/22 worsening mental status 11/23 ICU consult, intubated, started on pressors for profound shock with lactic acidosis. 11/24 Pressor requirements improved 11/29 weaning on vent but variable mentaiton 11/30 extubated 12/2 transfer to [facility]  Consultants: Critical care medicine    Subjective/Interval History: Patient awake.  Mildly distracted.  Denies any pain issues.    Assessment/Plan:  Acute metabolic encephalopathy, superimposed on dementia -Workup including MRI has been negative. Likely due to septic shock, icu delirium. -Delirium precautions ongoing. Patient's mentation has been improving.  He does have underlying dementia.  Could be at baseline.   HCAP, aspiration PNA Patient has completed course of antibiotics, Ancef.  Respiratory status noted to be stable.  Saturating normal on room air. Aspiration precautions.  Oropharyngeal dysphagia Following.  Currently on dysphagia 1 diet.  Some aspiration noted overnight.  Currently also on tube feedings.   Septic shock w multisystem organ failure (resp failure, liver dysfunction, AKI, improved, encephalopathy) Due to ecoli bacteremia, HCAP, UTI - shock resolved   Urinary retention  -[place] w no hydronephrosis -Continue urecholine -Foley out 12/1 -difficulty voiding over the weekend, Foley replaced on 12/3 due to obstruction and ongoing urinary retention.   Will likely need outpatient follow-up with urology if unable to wean off of Foley catheter prior to discharge   Transaminitis  Likely  secondary to septic shock -CMP stable - follow PRN   DM2, Insulin dependent, uncontrolled with hyperglycemia HbA1c 6.6.  Currently on glargine and SSI.  Monitor CBGs.  Essential hypertension/hyperlipidemia  Continue coreg -PRN hydralazine -ASA, statin ongoing   Anemia, acute secondary to critical illness  No evidence of overt bleeding.  Monitor periodically.   Goals of care -Ward of state  Currently on tube feedings.  Noted to have oropharyngeal dysphagia.  Patient may benefit from palliative care consultation to determine goals of care especially if his dysphagia does not improve.  Obesity Estimated body mass index is 31.01 kg/m as calculated from the following:   Height as of this encounter: 6' (1.829 m).   Weight as of this encounter: 103.7 kg.  DVT Prophylaxis: Subcutaneous heparin Code Status: Full code Family Communication: No family at bedside Disposition Plan: To be determined.  SNF is recommended  Status is: Inpatient Remains inpatient appropriate because: Oropharyngeal dysphagia requiring tube feedings      Medications: Scheduled:  acetaminophen (TYLENOL) oral liquid 160 mg/5 mL  650 mg Per Tube Q6H   Or   acetaminophen  650 mg Rectal Q6H   aspirin  81 mg Per Tube Daily   atorvastatin  40 mg Per Tube Daily   carvedilol  6.25 mg Per Tube BID WC   Chlorhexidine Gluconate Cloth  6 each Topical Daily   citalopram  40 mg Per Tube Daily   docusate  100 mg Per Tube BID   feeding supplement (PROSource TF20)  60 mL Per Tube Daily   free water  100 mL Per Tube Q4H   heparin injection (subcutaneous)  5,000 Units Subcutaneous Q8H   insulin aspart  0-15 Units Subcutaneous Q4H   insulin aspart  8 Units Subcutaneous  7.7* 8.1* 8.6*  PHOS 2.0* 1.9* 3.3    GFR: Estimated Creatinine Clearance: 108.5 mL/min (A) (by C-G formula based on SCr of 0.56 mg/dL (L)).  Liver Function Tests: Recent Labs  Lab 08/18/22 0500  AST 127*  ALT 123*  ALKPHOS 305*  BILITOT 0.2*  PROT 5.0*  ALBUMIN 1.6*    CBG: Recent Labs  Lab 08/23/22 1550 08/23/22 2134 08/23/22 2328 08/24/22 0430 08/24/22 0827  GLUCAP 191* 78 117* 157* 139*     Radiology Studies: DG Swallowing Func-Speech Pathology  Result Date: 08/22/2022 Table formatting from the original result was not included. Objective Swallowing Evaluation: Type of Study: MBS-Modified Barium Swallow Study  Patient Details Name: CHEROKEE CLOWERS MRN: [id number] Date of Birth: 1951-01-11 Today's Date: 08/22/2022 Time: SLP Start Time (ACUTE ONLY): 1508 -SLP Stop Time (ACUTE ONLY): 1532 SLP Time Calculation (min) (ACUTE ONLY): 24 min Past Medical History: No past medical history on file. Past Surgical History: No past surgical history on file. HPI: Pt is a 71 yo M admittted 11/20 after being found down outside of his group home. Pt found to have mild nasal fracture MRI brain 11/20 negative for acute changes. Pt seen by SLP 11/21 and  11/22; a dysphagia 2 diet with thin liquids initiated on 11/22. Pt developed progressively worsening lethargy, increasing hypotension, rising WBC, near obtundation, and SOB and was transferred to the ICU 11/23; ETT 11/23-11/30. Pt found to have sepsis, UTI with septic shock, and PNA. PMH: dementia, T2DM, Htn, HLD, CVA, housing insecurity, and tobacco use disorder. BSE 05/09/21: regular texture diet and thin liquids recommended without need for follow up.  Subjective: pt in bed, asleep - woke and indicated he wanted food/drink  Recommendations for follow up therapy are one component of a multi-disciplinary discharge planning process, led by the attending physician.  Recommendations may be updated based on patient status, additional functional criteria and insurance authorization. Assessment / Plan / Recommendation   08/22/2022   4:12 PM Clinical Impressions Clinical Impression Pt presents with oropharyngeal dysphagia characterized by reduced posterior propulsion, weak lingual manipulation, reduced bolus cohesion and a pharyngeal delay. He demonstrated lingual pumping, difficulty with A-P transport of the barium tablet, and premature spillage to the valleculae and pyriform sinuses. Penetration (PAS 2,3) and silent aspiration (PAS 8) were noted with thin liquids. Transient penetration (PAS 2; WNL) was observed with consecutive swallows of nectar thick liquids via straw. Prompted coughing was ineffective in expelling aspirated material. Use of individual boluses of thin liquids via cup/straw as well as use of a chin tuck posture inconsistently improved laryngeal invasion to PAS 5, but aspiration was noted thereafter. A dysphagia 2 diet with nectar thick liquids is recommended at this time. SLP will follow for dysphagia treatment. SLP Visit Diagnosis Dysphagia, oropharyngeal phase (R13.12) Impact on safety and function Mild aspiration risk     08/22/2022   4:12 PM Treatment Recommendations Treatment Recommendations Therapy  as outlined in treatment plan below     08/22/2022   4:12 PM Prognosis Prognosis for Safe Diet Advancement Good Barriers to Reach Goals Cognitive deficits   08/22/2022   4:12 PM Diet Recommendations SLP Diet Recommendations Dysphagia 2 (Fine chop) solids;Nectar thick liquid Liquid Administration via Cup;Straw Medication Administration Crushed with puree Compensations Slow rate;Small sips/bites Postural Changes Seated upright at 90 degrees     08/22/2022   4:12 PM Other Recommendations Oral Care Recommendations Oral care BID Follow Up Recommendations Skilled nursing-short term rehab (<3 hours/day) Functional Status Assessment Patient has had a recent

## 2022-08-24 NOTE — Progress Notes (Signed)
Speech Language Pathology Treatment: Dysphagia  Patient Details Name: Charles Fox MRN: [id number] DOB: [birth year] Today's Date: 08/24/2022 Time: 2979-8921 SLP Time Calculation (min) (ACUTE ONLY): 16 min  Assessment / Plan / Recommendation Clinical Impression  Pt was seen for dysphagia treatment. Pt's RN, Tresa Endo, reported that the pt was consistently coughing after intake of breakfast. Pt was alert and cooperative during the session. He stated that his swallowing "felt different" than on Monday, but he could not describe his symptoms any further. Despite prompts, pt consistently used large boluses and consecutive swallows with nectar thick liquids via straw. Subtle throat clearing and/or coughing were noted thereafter. Slight throat clearing was also noted with dysphagia 2 solids. Signs of aspiration were eliminated with dysphagia 1 solids and nectar thick liquids via cup. Mastication was mildly increased and pt exhibited grimacing with dysphagia 2 solids and liquids, reporting discomfort when swallowing. Considering pt's difficulty observing precautions, the results of the MBS, and RN's reported symptoms, SLP suspects aspiration of nectar thick liquids via consecutive swallows with delayed laryngeal sensation. A dysphagia 1 diet with nectar thick liquids is recommended with observance of swallowing precautions and full supervision to ensure observance. SLP will continue to follow pt.     HPI HPI: Pt is a 71 yo M admittted 11/20 after being found down outside of his group home. Pt found to have mild nasal fracture MRI brain 11/20 negative for acute changes. Pt seen by SLP 11/21 and 11/22; a dysphagia 2 diet with thin liquids initiated on 11/22. Pt developed progressively worsening lethargy, increasing hypotension, rising WBC, near obtundation, and SOB and was transferred to the ICU 11/23; ETT 11/23-11/30. Pt found to have sepsis, UTI with septic shock, and PNA. PMH: dementia, T2DM, Htn, HLD, CVA,  housing insecurity, and tobacco use disorder. BSE 05/09/21: regular texture diet and thin liquids recommended without need for follow up.      SLP Plan  Continue with current plan of care      Recommendations for follow up therapy are one component of a multi-disciplinary discharge planning process, led by the attending physician.  Recommendations may be updated based on patient status, additional functional criteria and insurance authorization.    Recommendations  Diet recommendations: Dysphagia 1 (puree);Nectar-thick liquid Liquids provided via: Cup;No straw Medication Administration: Via alternative means (or crushed with puree) Supervision: Full supervision/cueing for compensatory strategies;Trained caregiver to feed patient Compensations: Slow rate;Small sips/bites Postural Changes and/or Swallow Maneuvers: Seated upright 90 degrees                Oral Care Recommendations: Oral care QID;Oral care prior to ice chip/H20 Follow Up Recommendations: Skilled nursing-short term rehab (<3 hours/day) Assistance recommended at discharge: Frequent or constant Supervision/Assistance SLP Visit Diagnosis: Dysphagia, unspecified (R13.10) Plan: Continue with current plan of care         Charles Fox I. Vear Clock, MS, CCC-SLP Acute Rehabilitation Services Office number [phone]  Charles Fox  08/24/2022, 10:07 AM

## 2022-08-25 LAB — CBC
HCT: 30.2 % — ABNORMAL LOW (ref 39.0–52.0)
Hemoglobin: 9.6 g/dL — ABNORMAL LOW (ref 13.0–17.0)
MCH: 30.2 pg (ref 26.0–34.0)
MCHC: 31.8 g/dL (ref 30.0–36.0)
MCV: 95 fL (ref 80.0–100.0)
Platelets: 623 10*3/uL — ABNORMAL HIGH (ref 150–400)
RBC: 3.18 MIL/uL — ABNORMAL LOW (ref 4.22–5.81)
RDW: 16 % — ABNORMAL HIGH (ref 11.5–15.5)
WBC: 10.8 10*3/uL — ABNORMAL HIGH (ref 4.0–10.5)
nRBC: 0 % (ref 0.0–0.2)

## 2022-08-25 LAB — COMPREHENSIVE METABOLIC PANEL
ALT: 84 U/L — ABNORMAL HIGH (ref 0–44)
AST: 64 U/L — ABNORMAL HIGH (ref 15–41)
Albumin: 2.4 g/dL — ABNORMAL LOW (ref 3.5–5.0)
Alkaline Phosphatase: 236 U/L — ABNORMAL HIGH (ref 38–126)
Anion gap: 6 (ref 5–15)
BUN: 15 mg/dL (ref 8–23)
CO2: 24 mmol/L (ref 22–32)
Calcium: 8.8 mg/dL — ABNORMAL LOW (ref 8.9–10.3)
Chloride: 106 mmol/L (ref 98–111)
Creatinine, Ser: 0.6 mg/dL — ABNORMAL LOW (ref 0.61–1.24)
GFR, Estimated: >60 mL/min (ref >60)
Glucose, Bld: 150 mg/dL — ABNORMAL HIGH (ref 70–99)
Potassium: 4.4 mmol/L (ref 3.5–5.1)
Sodium: 136 mmol/L (ref 135–145)
Total Bilirubin: 0.4 mg/dL (ref 0.3–1.2)
Total Protein: 6.6 g/dL (ref 6.5–8.1)

## 2022-08-25 LAB — GLUCOSE, CAPILLARY: Glucose-Capillary: 154 mg/dL — ABNORMAL HIGH (ref 70–99)

## 2022-08-25 LAB — MAGNESIUM: Magnesium: 1.9 mg/dL (ref 1.7–2.4)

## 2022-08-25 LAB — PHOSPHORUS: Phosphorus: 3.4 mg/dL (ref 2.5–4.6)

## 2022-08-25 MED ORDER — GLUCERNA 1.2 CAL PO LIQD
1000.0000 mL | ORAL | Status: DC
  Administered 2022-08-25: 1000 mL
  Filled 2022-08-25 (×2): qty 1000

## 2022-08-25 MED ORDER — DOCUSATE SODIUM 50 MG/5ML PO LIQD
100.0000 mg | Freq: Two times a day (BID) | ORAL | Status: DC
  Administered 2022-08-25 – 2022-08-26 (×3): 100 mg via ORAL
  Filled 2022-08-25 (×3): qty 10

## 2022-08-25 MED ORDER — ADULT MULTIVITAMIN W/MINERALS CH
1.0000 | ORAL_TABLET | Freq: Every day | ORAL | Status: DC
  Administered 2022-08-25 – 2022-08-29 (×5): 1
  Filled 2022-08-25 (×4): qty 1

## 2022-08-25 MED ORDER — ATORVASTATIN CALCIUM 40 MG PO TABS
40.0000 mg | ORAL_TABLET | Freq: Every day | ORAL | Status: DC
  Administered 2022-08-25 – 2022-08-29 (×5): 40 mg via ORAL
  Filled 2022-08-25 (×4): qty 1

## 2022-08-25 MED ORDER — QUETIAPINE FUMARATE 50 MG PO TABS
75.0000 mg | ORAL_TABLET | Freq: Every day | ORAL | Status: DC
  Administered 2022-08-25 – 2022-08-28 (×4): 75 mg via ORAL
  Filled 2022-08-25 (×4): qty 1

## 2022-08-25 MED ORDER — NEPRO/CARBSTEADY PO LIQD
237.0000 mL | Freq: Two times a day (BID) | ORAL | Status: DC
  Administered 2022-08-25 – 2022-08-29 (×9): 237 mL via ORAL

## 2022-08-25 MED ORDER — ASPIRIN 81 MG PO CHEW
81.0000 mg | CHEWABLE_TABLET | Freq: Every day | ORAL | Status: DC
  Administered 2022-08-26 – 2022-08-29 (×4): 81 mg via ORAL
  Filled 2022-08-25 (×4): qty 1

## 2022-08-25 MED ORDER — CITALOPRAM HYDROBROMIDE 40 MG PO TABS
40.0000 mg | ORAL_TABLET | Freq: Every day | ORAL | Status: DC
  Administered 2022-08-25 – 2022-08-29 (×5): 40 mg via ORAL
  Filled 2022-08-25 (×4): qty 1

## 2022-08-25 MED ORDER — PHENOL 1.4 % MT LIQD: 1.0000 | OROMUCOSAL | Status: DC | PRN

## 2022-08-25 MED ORDER — CARVEDILOL 6.25 MG PO TABS
6.2500 mg | ORAL_TABLET | Freq: Two times a day (BID) | ORAL | Status: DC
  Administered 2022-08-25 – 2022-08-29 (×9): 6.25 mg via ORAL
  Filled 2022-08-25 (×9): qty 1

## 2022-08-25 MED ORDER — POLYETHYLENE GLYCOL 3350 17 G PO PACK
17.0000 g | PACK | Freq: Every day | ORAL | Status: DC
  Administered 2022-08-25 – 2022-08-26 (×2): 17 g via ORAL
  Filled 2022-08-25: qty 1

## 2022-08-25 NOTE — Progress Notes (Signed)
Nutrition Follow-up  DOCUMENTATION CODES:  Not applicable  INTERVENTION:  Continue current diet per SLP Nursing to assist with tray set up and feeding Add Nepro Shake po BID, each supplement provides 425 kcal and 19 grams protein. Nepro is nectar thick and carb stable. Adjust TF to nocturnal: Glucerna 1.2 @ 59m/h x 12 hours (9090m Free water flush 10054m4h This will provide 1080 kcal, 54g of protein, and 725m85m free water (TF+flush=1325)  NUTRITION DIAGNOSIS:  Inadequate oral intake related to lethargy/confusion as evidenced by meal completion < 50%. - Ongoing, being addressed tube feeds  GOAL:  Patient will meet greater than or equal to 90% of their needs - Met with tube feeding at goal , diet in place  MONITOR:  Diet advancement, TF tolerance  REASON FOR ASSESSMENT:  Ventilator, Consult Enteral/tube feeding initiation and management  ASSESSMENT:  Pt with hx of dementia, DM type 2, HTN, HLD, tobacco use, and prior CVA presented to ED after pt fell face forward onto concrete.  11/20 - admitted; on dysphagia 2 diet 11/22 - NPO 11/23 - transferred to ICU, intubated, started on pressors for profound shock secondary to PNA/UTI vs asp/HCAP 11/29 - cortrak placed; per xray tube tip is in the distal stomach 11/30 - extubated 12/4 - MBS, DYS2, nectar thick 12/6 - Diet downgraded by SLP, DYS1 with nectar  Pt noted to have stable intake of diet, but downgraded to DYS1 yesterday. Pt has expressed that he wants the cortrak tube out. Md plans to remove tomorrow if intake remains stable. Will adjust to nocturnal to give pt best chance of PO intake today. Will also add supplements to augment intake.   Average Meal Intake: 12/4-12/7: 85% intake x 4 recorded meals  Nutritionally Relevant Medications: Scheduled Meds:  atorvastatin  40 mg Per Tube Daily   docusate  100 mg Per Tube BID   feeding supplement (PROSource TF20)  60 mL Per Tube Daily   free water  100 mL Per Tube Q4H    insulin aspart  0-15 Units Subcutaneous Q4H   insulin aspart  8 Units Subcutaneous Q4H   insulin glargine-yfgn  10 Units Subcutaneous BID   multivitamin with minerals  1 tablet Per Tube Daily   Continuous Infusions:  feeding supplement (VITAL 1.5 CAL) 1,000 mL (08/25/22 0631)    Labs Reviewed: CBG ranges from 125-213 mg/dL over the last 24 hours HgbA1c 6.6% (11/30)  Diet Order:   Diet Order             DIET - DYS 1 Room service appropriate? Yes with Assist; Fluid consistency: Nectar Thick  Diet effective now                   EDUCATION NEEDS:  Not appropriate for education at this time  Skin:  Skin Assessment:  (Laceration upper lip, skin tear L buttocks)  Last BM:  11/28  Height:   Ht Readings from Last 1 Encounters:  08/09/22 6' (1.829 m)    Weight:   Wt Readings from Last 1 Encounters:  08/25/22 100.4 kg    Ideal Body Weight:  80.9 kg  BMI:  Body mass index is 30.02 kg/m.  Estimated Nutritional Needs:  Kcal:  2000-2200 kcal/d Protein:  100-115 g/d Fluid:  2-2.2L/d   RachRanell Patrick, LDN Clinical Dietitian RD pager # available in [facility] hours/weekend pager # available in [facility]

## 2022-08-25 NOTE — Progress Notes (Signed)
Speech Language Pathology Treatment: Dysphagia  Patient Details Name: Charles Fox MRN: [id number] DOB: [birth year] Today's Date: 08/25/2022 Time: 0850-0906 SLP Time Calculation (min) (ACUTE ONLY): 16 min  Assessment / Plan / Recommendation Clinical Impression  Pt was seen for dysphagia treatment. He was alert and cooperative during the session. Pt's RN reported that the pt tolerated dinner without s/s of aspiration and consumed 90% of the meal. Pt was seen during breakfast; he tolerated puree and nectar thick liquids via cup without overt s/s of aspiration with the exception of twice when he attempted to speak with cream of wheat in his mouth or when he was challenged with full-tsp boluses of cream of wheat. SLP suspects aspiration secondary to premature spillage and the pharyngeal delay. Pt's current diet will be continued, but full supervision is necessary to ensure strict observance of swallowing precautions.    HPI HPI: Pt is a 71 yo M admittted 11/20 after being found down outside of his group home. Pt found to have mild nasal fracture MRI brain 11/20 negative for acute changes. Pt seen by SLP 11/21 and 11/22; a dysphagia 2 diet with thin liquids initiated on 11/22. Pt developed progressively worsening lethargy, increasing hypotension, rising WBC, near obtundation, and SOB and was transferred to the ICU 11/23; ETT 11/23-11/30. Pt found to have sepsis, UTI with septic shock, and PNA. PMH: dementia, T2DM, Htn, HLD, CVA, housing insecurity, and tobacco use disorder. BSE 05/09/21: regular texture diet and thin liquids recommended without need for follow up.      SLP Plan  Continue with current plan of care      Recommendations for follow up therapy are one component of a multi-disciplinary discharge planning process, led by the attending physician.  Recommendations may be updated based on patient status, additional functional criteria and insurance authorization.    Recommendations  Diet  recommendations: Dysphagia 1 (puree);Nectar-thick liquid Liquids provided via: Cup;No straw Medication Administration: Via alternative means (or crushed with puree) Supervision: Full supervision/cueing for compensatory strategies;Trained caregiver to feed patient Compensations: Slow rate;Small sips/bites Postural Changes and/or Swallow Maneuvers: Seated upright 90 degrees                Oral Care Recommendations: Oral care QID;Oral care prior to ice chip/H20 Follow Up Recommendations: Skilled nursing-short term rehab (<3 hours/day) Assistance recommended at discharge: Frequent or constant Supervision/Assistance SLP Visit Diagnosis: Dysphagia, unspecified (R13.10) Plan: Continue with current plan of care         Charles Fox I. Vear Clock, MS, CCC-SLP Acute Rehabilitation Services Office number [phone]  Charles Fox  08/25/2022, 9:14 AM

## 2022-08-25 NOTE — Progress Notes (Addendum)
[facility] PROGRESS NOTE   Charles Fox:[id number] DOB: 03-14-1951 DOA: 08/08/2022  PCP: Pcp, No  Brief History/Interval Summary: 71 year old man hx of DM2, HTN, HLD, CVA, dementia resides in SNF, ward of the state, presented with AMS, questionable fall and seizure. Nasal fx on imaging. Neuro workup neg (MRI/EEG), question raised of progressive dementia or hypertensive encephalopathy.   Hospital Course:    08/08/22 admit 11/22 worsening mental status 11/23 ICU consult, intubated, started on pressors for profound shock with lactic acidosis. 11/24 Pressor requirements improved 11/29 weaning on vent but variable mentaiton 11/30 extubated 12/2 transfer to [facility]  Consultants: Critical care medicine    Subjective/Interval History: Patient is awake alert.  Denies any complaints.  Wants to take the tube out.  Noted to have mittens on.    Assessment/Plan:  Acute metabolic encephalopathy, superimposed on dementia -Workup including MRI has been negative. Likely due to septic shock, icu delirium. -Delirium precautions ongoing. Patient's mentation has been improving.  He does have underlying dementia.  Could be at baseline.   HCAP, aspiration PNA Patient has completed course of antibiotics, Ancef.   Aspiration precautions. Respiratory status is stable.  Saturating normal on room air.  Oropharyngeal dysphagia Currently on dysphagia 1 diet.   Speech therapy is following.  Seems to have done well with his meals yesterday.  Will change his medications to oral.  If he continues to show good intake over the next 24 hours we can take the feeding tube out tomorrow.     Septic shock w multisystem organ failure (resp failure, liver dysfunction, AKI, improved, encephalopathy) Due to ecoli bacteremia, HCAP, UTI - shock resolved   Urinary retention  -[place] w no hydronephrosis -Continue urecholine -Foley out 12/1 -difficulty voiding over the weekend, Foley replaced on 12/3 due to  obstruction and ongoing urinary retention.   Needs to be mobilized more before the next voiding trial.  May need to be seen by urology as it is quite likely he will need to go to a nursing facility with Foley catheter.   Transaminitis  Likely secondary to septic shock LFTs have been improving gradually.   DM2, Insulin dependent, uncontrolled with hyperglycemia HbA1c 6.6.  Currently on glargine and SSI.  Monitor CBGs.  CBGs are reasonably well-controlled.  Essential hypertension/hyperlipidemia  Continue coreg.  Blood pressure is reasonably well-controlled.  Occasional high readings noted. -PRN hydralazine -ASA, statin ongoing   Anemia, acute secondary to critical illness  No evidence of overt bleeding.  Monitor periodically.   Goals of care -Ward of state  Currently on tube feedings.  Noted to have oropharyngeal dysphagia.  Patient may benefit from palliative care consultation to determine goals of care especially if his dysphagia does not improve.  Obesity Estimated body mass index is 30.02 kg/m as calculated from the following:   Height as of this encounter: 6' (1.829 m).   Weight as of this encounter: 100.4 kg.  DVT Prophylaxis: Subcutaneous heparin Code Status: Full code Family Communication: No family at bedside Disposition Plan: To be determined.  SNF is recommended  Status is: Inpatient Remains inpatient appropriate because: Oropharyngeal dysphagia requiring tube feedings      Medications: Scheduled:  acetaminophen (TYLENOL) oral liquid 160 mg/5 mL  650 mg Per Tube Q6H   Or   acetaminophen  650 mg Rectal Q6H   aspirin  81 mg Per Tube Daily   atorvastatin  40 mg Per Tube Daily   carvedilol  6.25 mg Per Tube BID WC  Chlorhexidine Gluconate Cloth  6 each Topical Daily   citalopram  40 mg Per Tube Daily   docusate  100 mg Per Tube BID   feeding supplement (PROSource TF20)  60 mL Per Tube Daily   free water  100 mL Per Tube Q4H   heparin injection (subcutaneous)   5,000 Units Subcutaneous Q8H   insulin aspart  0-15 Units Subcutaneous Q4H   insulin aspart  8 Units Subcutaneous Q4H   insulin glargine-yfgn  10 Units Subcutaneous BID   multivitamin with minerals  1 tablet Per Tube Daily   nicotine  14 mg Transdermal Daily   mouth rinse  15 mL Mouth Rinse 4 times per day   polyethylene glycol  17 g Per Tube Daily   QUEtiapine  75 mg Per Tube QHS   Continuous:  feeding supplement (VITAL 1.5 CAL) 1,000 mL (08/25/22 0631)   ZOX:[id number], ipratropium-albuterol, ondansetron **OR** ondansetron (ZOFRAN) IV, mouth rinse  Antibiotics: Anti-infectives (From admission, onward)    Start     Dose/Rate Route Frequency Ordered Stop   08/15/22 1000  ceFAZolin (ANCEF) IVPB 2g/100 mL premix        2 g 200 mL/hr over 30 Minutes Intravenous Every 8 hours 08/15/22 0912 08/20/22 0700   08/11/22 1200  cefTRIAXone (ROCEPHIN) 2 g in sodium chloride 0.9 % 100 mL IVPB  Status:  Discontinued        2 g 200 mL/hr over 30 Minutes Intravenous Daily 08/11/22 1126 08/15/22 0912   08/11/22 0615  vancomycin (VANCOREADY) IVPB 2000 mg/400 mL        2,000 mg 200 mL/hr over 120 Minutes Intravenous  Once 08/11/22 0515 08/11/22 0821   08/11/22 0515  vancomycin variable dose per unstable renal function (pharmacist dosing)  Status:  Discontinued         Does not apply See admin instructions 08/11/22 0515 08/11/22 1126   08/10/22 2315  Ampicillin-Sulbactam (UNASYN) 3 g in sodium chloride 0.9 % 100 mL IVPB  Status:  Discontinued        3 g 200 mL/hr over 30 Minutes Intravenous Every 6 hours 08/10/22 2223 08/11/22 1126       Objective:  Vital Signs  Vitals:   08/24/22 2348 08/25/22 0313 08/25/22 0424 08/25/22 0833  BP: (!) 157/69 (!) 150/72  (!) 152/71  Pulse: 87 85  80  Resp: 16 16  18   Temp: 98.6 F (37 C) 98.6 F (37 C)  98.2 F (36.8 C)  TempSrc: Oral Oral  Axillary  SpO2: 95% 98%  99%  Weight:   100.4 kg   Height:        Intake/Output Summary (Last 24 hours) at  08/25/2022 1055 Last data filed at 08/24/2022 2019 Gross per 24 hour  Intake 240 ml  Output 800 ml  Net -560 ml    Filed Weights   08/19/22 0500 08/23/22 0500 08/25/22 0424  Weight: 114 kg 103.7 kg 100.4 kg    General appearance: Awake alert.  In no distress NG tube noted Resp: Clear to auscultation bilaterally.  Normal effort Cardio: S1-S2 is normal regular.  No S3-S4.  No rubs murmurs or bruit GI: Abdomen is soft.  Nontender nondistended.  Bowel sounds are present normal.  No masses organomegaly Extremities: No edema.  Full range of motion of lower extremities.   Lab Results:  Data Reviewed: I have personally reviewed following labs and reports of the imaging studies  CBC: Recent Labs  Lab 08/25/22 0547  WBC 10.8*  HGB 9.6*  HCT 30.2*  MCV 95.0  PLT 623*     Basic Metabolic Panel: Recent Labs  Lab 08/19/22 0439 08/20/22 0247 08/25/22 0547  NA 140 142 136  K 4.3 4.0 4.4  CL 99 105 106  CO2 27 28 24   GLUCOSE 146* 126* 150*  BUN 8 8 15   CREATININE 0.55* 0.56* 0.60*  CALCIUM 8.1* 8.6* 8.8*  MG  --   --  1.9  PHOS 1.9* 3.3 3.4     GFR: Estimated Creatinine Clearance: 106.9 mL/min (A) (by C-G formula based on SCr of 0.6 mg/dL (L)).  Liver Function Tests: Recent Labs  Lab 08/25/22 0547  AST 64*  ALT 84*  ALKPHOS 236*  BILITOT 0.4  PROT 6.6  ALBUMIN 2.4*     CBG: Recent Labs  Lab 08/24/22 1259 08/24/22 1543 08/24/22 2103 08/24/22 2350 08/25/22 0316  GLUCAP 213* 125* 193* 186* 154*      Radiology Studies: No results found.     LOS: 17 days   Fredis Malkiewicz [employer] on [email or web address]  08/25/2022, 10:55 AM

## 2022-08-26 LAB — GLUCOSE, CAPILLARY
Glucose-Capillary: 79 mg/dL (ref 70–99)
Glucose-Capillary: 85 mg/dL (ref 70–99)
Glucose-Capillary: 135 mg/dL — ABNORMAL HIGH (ref 70–99)
Glucose-Capillary: 177 mg/dL — ABNORMAL HIGH (ref 70–99)
Glucose-Capillary: 201 mg/dL — ABNORMAL HIGH (ref 70–99)

## 2022-08-26 MED ORDER — ACETAMINOPHEN 160 MG/5ML PO SOLN
650.0000 mg | Freq: Four times a day (QID) | ORAL | Status: DC
  Administered 2022-08-26 – 2022-08-29 (×11): 650 mg via ORAL
  Filled 2022-08-26 (×12): qty 20.3

## 2022-08-26 MED ORDER — INSULIN ASPART 100 UNIT/ML IJ SOLN
0.0000 [IU] | Freq: Three times a day (TID) | INTRAMUSCULAR | Status: DC
  Administered 2022-08-26: 3 [IU] via SUBCUTANEOUS
  Administered 2022-08-26: 2 [IU] via SUBCUTANEOUS
  Administered 2022-08-27 (×3): 5 [IU] via SUBCUTANEOUS
  Administered 2022-08-28: 8 [IU] via SUBCUTANEOUS
  Administered 2022-08-28: 3 [IU] via SUBCUTANEOUS
  Administered 2022-08-28: 11 [IU] via SUBCUTANEOUS
  Administered 2022-08-29: 3 [IU] via SUBCUTANEOUS
  Administered 2022-08-29: 5 [IU] via SUBCUTANEOUS

## 2022-08-26 MED ORDER — INSULIN ASPART 100 UNIT/ML IJ SOLN
0.0000 [IU] | Freq: Every day | INTRAMUSCULAR | Status: DC
  Administered 2022-08-26 – 2022-08-28 (×3): 2 [IU] via SUBCUTANEOUS

## 2022-08-26 MED ORDER — ACETAMINOPHEN 650 MG RE SUPP: 650.0000 mg | Freq: Four times a day (QID) | RECTAL | Status: DC

## 2022-08-26 NOTE — Progress Notes (Signed)
Occupational Therapy Treatment Patient Details Name: Charles Fox MRN: [id number] DOB: 10/19/1950 Today's Date: 08/26/2022   History of present illness Charles Fox is a 72 yo M admittted 11/20 after being found down outside of his group home.  PMH of dementia, T2DM, Htn, HLD, CVA, housing insecurity, and tobacco use disorder.  nasal fx noted on admission with worsening encephalopathy and resipiratory status.  Transferred to ICU intubated 11/23-30/23.  Now with sepsis, UTI with septic shock, and PNA.   OT comments  Pt making good progress towards OT goals this session. Pt mod A +2 for bed mobility, overall continues to be mod A for ADL requiring multimodal cues for task initiation, sequencing, and problem solving. Pt very cooperative and eager to work with therapy. Pt min A +2 safety for transfer to recliner from bed. Pt positive for orthostatics. BP sitting EOB was 127/75 and in standing was 94/58. Pt symptomatic. Mitts re-applied at end of session, and Pt left in chair with alarm. RN aware of transfer assist and orthostatics. OT will continue to follow acutely to maximize safety and independence in ADL and functional transfers. Next session focus on problem solving/sequencing grooming tasks in addition to OOB activity tolerance.     Recommendations for follow up therapy are one component of a multi-disciplinary discharge planning process, led by the attending physician.  Recommendations may be updated based on patient status, additional functional criteria and insurance authorization.    Follow Up Recommendations  Skilled nursing-short term rehab (<3 hours/day)     Assistance Recommended at Discharge Frequent or constant Supervision/Assistance  Patient can return home with the following  Direct supervision/assist for medications management;Direct supervision/assist for financial management;Assistance with cooking/housework;Assist for transportation;Help with stairs or ramp for  entrance;Two people to help with walking and/or transfers;Two people to help with bathing/dressing/bathroom   Equipment Recommendations  Other (comment) (defer to next venue)    Recommendations for Other Services PT consult;Speech consult    Precautions / Restrictions Precautions Precautions: Fall Precaution Comments: tube feed Restrictions Weight Bearing Restrictions: No       Mobility Bed Mobility Overal bed mobility: Needs Assistance Bed Mobility: Supine to Sit     Supine to sit: Mod assist, +2 for safety/equipment, HOB elevated     General bed mobility comments: max vc for sequencing, assist with BLE and trunk elevation    Transfers Overall transfer level: Needs assistance Equipment used: Rolling walker (2 wheels) (face to face) Transfers: Sit to/from Stand, Bed to chair/wheelchair/BSC Sit to Stand: Min assist, +2 safety/equipment     Step pivot transfers: Min assist, +2 safety/equipment     General transfer comment: balance for transfers/mobility greatly improved with RW this session. multimodal cues for sequencing and initiating, but only min A for boost and balance     Balance Overall balance assessment: Needs assistance Sitting-balance support: Single extremity supported, Feet supported Sitting balance-Leahy Scale: Fair Sitting balance - Comments: progressing to short bouts of supervision sitting EOB - but mostly min guard   Standing balance support: Bilateral upper extremity supported, During functional activity Standing balance-Leahy Scale: Poor                             ADL either performed or assessed with clinical judgement   ADL Overall ADL's : Needs assistance/impaired Eating/Feeding: NPO Eating/Feeding Details (indicate cue type and reason): able to hold cup and take sips with cues for slowing down Grooming: Wash/dry face;Sitting;Moderate assistance Grooming  Details (indicate cue type and reason): posterior LOB x2, requires mod A  during oral care for hand over hand initially, cues for spitting out extra fluids from oral care         Upper Body Dressing : Moderate assistance;Sitting Upper Body Dressing Details (indicate cue type and reason): donning gown Lower Body Dressing: Total assistance;Bed level Lower Body Dressing Details (indicate cue type and reason): given multiple attempts to don socks - Pt was not able to perform figure 4, and limited attempts at trying by patient - ultimately OT performed             Functional mobility during ADLs: Minimal assistance;+2 for safety/equipment;Cueing for safety;Cueing for sequencing;Rolling walker (2 wheels) (steps up the bed) General ADL Comments: improved standing balance with RW for cueing to weight shift fwd    Extremity/Trunk Assessment Upper Extremity Assessment Upper Extremity Assessment: Generalized weakness            Vision       Perception     Praxis      Cognition Arousal/Alertness: Awake/alert Behavior During Therapy: WFL for tasks assessed/performed Overall Cognitive Status: No family/caregiver present to determine baseline cognitive functioning Area of Impairment: Orientation, Attention, Memory, Following commands, Safety/judgement, Awareness, Problem solving                 Orientation Level: Disoriented to, Situation, Time (could not choose month when given choices) Current Attention Level: Focused Memory: Decreased short-term memory Following Commands: Follows one step commands with increased time Safety/Judgement: Decreased awareness of safety, Decreased awareness of deficits Awareness: Intellectual Problem Solving: Slow processing, Decreased initiation, Requires verbal cues, Difficulty sequencing, Requires tactile cues General Comments: Pt with known dx of dementia, knew he was at [facility] today. Cooperative and following one step directions with multimodal cues - Pt forgetting task at hand frequently. Very cooperative throughout  session        Exercises      Shoulder Instructions       General Comments Pt positive for orthostatics. BP sitting EOB was 127/75 and in standing was 94/58. Pt symptomatic.    Pertinent Vitals/ Pain       Pain Assessment Pain Assessment: Faces Pain Location: back (chronic)/discomfort from feeding tube Pain Descriptors / Indicators: Grimacing, Sore, Aching Pain Intervention(s): Monitored during session, Repositioned  Home Living                                          Prior Functioning/Environment              Frequency  Min 2X/week        Progress Toward Goals  OT Goals(current goals can now be found in the care plan section)  Progress towards OT goals: Progressing toward goals  Acute Rehab OT Goals Patient Stated Goal: none stated OT Goal Formulation: Patient unable to participate in goal setting Time For Goal Achievement: 08/29/22 Potential to Achieve Goals: Fair ADL Goals Pt Will Perform Eating: with set-up;bed level Pt Will Perform Grooming: sitting;with min assist (EOB unsupported) Pt Will Perform Upper Body Bathing: with min assist;sitting (EOB unsupported with min instructional cueing) Pt Will Transfer to Toilet: with max assist;stand pivot transfer;bedside commode Additional ADL Goal #1: Pt will maintain sustained attention during selfcare tasks for 10 mins with no more than mod demonstration cueing. Additional ADL Goal #2: Pt will complete supine to sit EOB with  max assist in preparation for grooming, bathing, or toilet transfers. Additional ADL Goal #4: Pt will complete sit to stand with no more than mod assist in preparation for LB selfcare tasks.  Plan Discharge plan remains appropriate    Co-evaluation    PT/OT/SLP Co-Evaluation/Treatment: Yes Reason for Co-Treatment: Necessary to address cognition/behavior during functional activity;For patient/therapist safety;To address functional/ADL transfers PT goals addressed during  session: Mobility/safety with mobility;Balance;Proper use of DME;Strengthening/ROM OT goals addressed during session: ADL's and self-care;Proper use of Adaptive equipment and DME;Strengthening/ROM      AM-PAC OT "6 Clicks" Daily Activity     Outcome Measure   Help from another person eating meals?: A Lot Help from another person taking care of personal grooming?: A Lot Help from another person toileting, which includes using toliet, bedpan, or urinal?: A Lot Help from another person bathing (including washing, rinsing, drying)?: A Lot Help from another person to put on and taking off regular upper body clothing?: A Lot Help from another person to put on and taking off regular lower body clothing?: A Lot 6 Click Score: 12    End of Session Equipment Utilized During Treatment: Gait belt;Rolling walker (2 wheels)  OT Visit Diagnosis: Unsteadiness on feet (R26.81);History of falling (Z91.81);Other symptoms and signs involving cognitive function;Muscle weakness (generalized) (M62.81)   Activity Tolerance Patient tolerated treatment well   Patient Left in chair;with call bell/phone within reach;with chair alarm set;with restraints reapplied   Nurse Communication Mobility status;Precautions;Other (comment) (BP)        Time: 4098-1191 OT Time Calculation (min): 27 min  Charges: OT General Charges $OT Visit: 1 Visit OT Treatments $Self Care/Home Management : 8-22 mins  Nyoka Cowden OTR/L Acute Rehabilitation Services Office: [phone]  Evern Bio [facility] 08/26/2022, 12:00 PM

## 2022-08-26 NOTE — Plan of Care (Signed)
  Problem: Education: Goal: Knowledge of disease or condition will improve Outcome: Not Progressing   Problem: Health Behavior/Discharge Planning: Goal: Ability to manage health-related needs will improve Outcome: Not Progressing   

## 2022-08-26 NOTE — Progress Notes (Signed)
[facility] PROGRESS NOTE   Charles Fox ZOX:[id number] DOB: September 26, 1950 DOA: 08/08/2022  PCP: Pcp, No  Brief History/Interval Summary: 71 year old man hx of DM2, HTN, HLD, CVA, dementia resides in SNF, ward of the state, presented with AMS, questionable fall and seizure. Nasal fx on imaging. Neuro workup neg (MRI/EEG), question raised of progressive dementia or hypertensive encephalopathy.   Hospital Course:    08/08/22 admit 11/22 worsening mental status 11/23 ICU consult, intubated, started on pressors for profound shock with lactic acidosis. 11/24 Pressor requirements improved 11/29 weaning on vent but variable mentaiton 11/30 extubated 12/2 transfer to [facility]  Consultants: Critical care medicine    Subjective/Interval History: Patient is awake alert.  Mildly distracted.  No complaints offered.      Assessment/Plan:  Acute metabolic encephalopathy, superimposed on dementia Workup including MRI has been negative. Likely due to septic shock, icu delirium. Delirium precautions ongoing. Patient mentation is gradually improving.  He does have underlying dementia.   HCAP, aspiration PNA Patient has completed course of antibiotics, Ancef.   Aspiration precautions. Respiratory status is stable.  Saturating normal on room air.  Oropharyngeal dysphagia Currently on dysphagia 1 diet.   Speech therapy has been following.  Patient has done very well with his meals in the last 48 hours.  Will remove his NG feeding tube today.   Septic shock w multisystem organ failure (resp failure, liver dysfunction, AKI, improved, encephalopathy) Due to ecoli bacteremia, HCAP, UTI - shock resolved   Urinary retention  -[place] w no hydronephrosis -Continue urecholine -Foley out 12/1 -difficulty voiding over the weekend, Foley replaced on 12/3 due to obstruction and ongoing urinary retention.   Needs to be mobilized more before the next voiding trial.  May need to be seen by urology as  it is quite likely he will need to go to a nursing facility with Foley catheter.   Transaminitis  Likely secondary to septic shock LFTs have been improving gradually.   DM2, Insulin dependent, uncontrolled with hyperglycemia HbA1c 6.6.  Currently on glargine and SSI.  Monitor CBGs.  CBGs are reasonably well-controlled.  Essential hypertension/hyperlipidemia  Continue coreg.  Blood pressure is reasonably well-controlled.  Occasional high readings noted. -PRN hydralazine -ASA, statin ongoing   Anemia, acute secondary to critical illness  No evidence of overt eating.  Monitor periodically.   Goals of care -Ward of state  Has done well with oral intake over the last 24 to 48 hours.  NG feeding tube to be removed today.  Obesity Estimated body mass index is 29.87 kg/m as calculated from the following:   Height as of this encounter: 6' (1.829 m).   Weight as of this encounter: 99.9 kg.  DVT Prophylaxis: Subcutaneous heparin Code Status: Full code Family Communication: No family at bedside Disposition Plan: SNF is recommended  Status is: Inpatient Remains inpatient appropriate because: Oropharyngeal dysphagia requiring tube feedings      Medications: Scheduled:  acetaminophen (TYLENOL) oral liquid 160 mg/5 mL  650 mg Per Tube Q6H   Or   acetaminophen  650 mg Rectal Q6H   aspirin  81 mg Oral Daily   atorvastatin  40 mg Oral Daily   carvedilol  6.25 mg Oral BID WC   Chlorhexidine Gluconate Cloth  6 each Topical Daily   citalopram  40 mg Oral Daily   docusate  100 mg Oral BID   feeding supplement (GLUCERNA 1.2 CAL)  1,000 mL Per Tube Q24H   feeding supplement (NEPRO CARB STEADY)  237 mL Oral BID BM   free water  100 mL Per Tube Q4H   heparin injection (subcutaneous)  5,000 Units Subcutaneous Q8H   insulin aspart  0-15 Units Subcutaneous Q4H   insulin aspart  8 Units Subcutaneous Q4H   insulin glargine-yfgn  10 Units Subcutaneous BID   multivitamin with minerals  1 tablet  Per Tube Daily   nicotine  14 mg Transdermal Daily   mouth rinse  15 mL Mouth Rinse 4 times per day   polyethylene glycol  17 g Oral Daily   QUEtiapine  75 mg Oral QHS   Continuous:   UVO:[id number], ipratropium-albuterol, ondansetron **OR** ondansetron (ZOFRAN) IV, mouth rinse, phenol  Antibiotics: Anti-infectives (From admission, onward)    Start     Dose/Rate Route Frequency Ordered Stop   08/15/22 1000  ceFAZolin (ANCEF) IVPB 2g/100 mL premix        2 g 200 mL/hr over 30 Minutes Intravenous Every 8 hours 08/15/22 0912 08/20/22 0700   08/11/22 1200  cefTRIAXone (ROCEPHIN) 2 g in sodium chloride 0.9 % 100 mL IVPB  Status:  Discontinued        2 g 200 mL/hr over 30 Minutes Intravenous Daily 08/11/22 1126 08/15/22 0912   08/11/22 0615  vancomycin (VANCOREADY) IVPB 2000 mg/400 mL        2,000 mg 200 mL/hr over 120 Minutes Intravenous  Once 08/11/22 0515 08/11/22 0821   08/11/22 0515  vancomycin variable dose per unstable renal function (pharmacist dosing)  Status:  Discontinued         Does not apply See admin instructions 08/11/22 0515 08/11/22 1126   08/10/22 2315  Ampicillin-Sulbactam (UNASYN) 3 g in sodium chloride 0.9 % 100 mL IVPB  Status:  Discontinued        3 g 200 mL/hr over 30 Minutes Intravenous Every 6 hours 08/10/22 2223 08/11/22 1126       Objective:  Vital Signs  Vitals:   08/25/22 2327 08/26/22 0324 08/26/22 0343 08/26/22 0743  BP: (!) 143/77 129/65  (!) 154/72  Pulse: 90 88  80  Resp: 17 17  17   Temp: 98.8 F (37.1 C) 98.9 F (37.2 C)  98.3 F (36.8 C)  TempSrc: Oral Oral  Oral  SpO2: 97% 98%  97%  Weight:   99.9 kg   Height:        Intake/Output Summary (Last 24 hours) at 08/26/2022 1043 Last data filed at 08/26/2022 0800 Gross per 24 hour  Intake 1095 ml  Output 1000 ml  Net 95 ml    Filed Weights   08/23/22 0500 08/25/22 0424 08/26/22 0343  Weight: 103.7 kg 100.4 kg 99.9 kg    General appearance: Awake alert.  In no distress.   Distracted Resp: Clear to auscultation bilaterally.  Normal effort Cardio: S1-S2 is normal regular.  No S3-S4.  No rubs murmurs or bruit GI: Abdomen is soft.  Nontender nondistended.  Bowel sounds are present normal.  No masses organomegaly Extremities: No edema.  Moving all of his extremities  Lab Results:  Data Reviewed: I have personally reviewed following labs and reports of the imaging studies  CBC: Recent Labs  Lab 08/25/22 0547  WBC 10.8*  HGB 9.6*  HCT 30.2*  MCV 95.0  PLT 623*     Basic Metabolic Panel: Recent Labs  Lab 08/20/22 0247 08/25/22 0547  NA 142 136  K 4.0 4.4  CL 105 106  CO2 28 24  GLUCOSE 126* 150*  BUN 8 15  CREATININE 0.56* 0.60*  CALCIUM 8.6* 8.8*  MG  --  1.9  PHOS 3.3 3.4     GFR: Estimated Creatinine Clearance: 106.6 mL/min (A) (by C-G formula based on SCr of 0.6 mg/dL (L)).  Liver Function Tests: Recent Labs  Lab 08/25/22 0547  AST 64*  ALT 84*  ALKPHOS 236*  BILITOT 0.4  PROT 6.6  ALBUMIN 2.4*     CBG: Recent Labs  Lab 08/24/22 2103 08/24/22 2350 08/25/22 0316 08/26/22 0328 08/26/22 0745  GLUCAP 193* 186* 154* 79 85      Radiology Studies: No results found.     LOS: 18 days   Brylan Dec [employer] on [email or web address]  08/26/2022, 10:43 AM

## 2022-08-26 NOTE — Progress Notes (Signed)
Physical Therapy Treatment Patient Details Name: Charles Fox MRN: [id number] DOB: [birth year] Today's Date: 08/26/2022   History of Present Illness [name] is a 71 yo M admittted 11/20 after being found down outside of his group home.  PMH of dementia, T2DM, Htn, HLD, CVA, housing insecurity, and tobacco use disorder.  nasal fx noted on admission with worsening encephalopathy and resipiratory status.  Transferred to ICU intubated 11/23-30/23.  Now with sepsis, UTI with septic shock, and PNA.    PT Comments    Patient progressing well this session able to stand with improved balance with RW and take steps forward then to recliner with min A of 2 for safety.  He did not tolerate standing well and noted to be hypotensive in standing.  Patient alert and cooperative and made comfortable in the chair.  Remains appropriate for SNF level rehab.  Progressing toward all goals and some goals met.  Updated goals this session.    Recommendations for follow up therapy are one component of a multi-disciplinary discharge planning process, led by the attending physician.  Recommendations may be updated based on patient status, additional functional criteria and insurance authorization.  Follow Up Recommendations  Skilled nursing-short term rehab (<3 hours/day) Can patient physically be transported by private vehicle: No   Assistance Recommended at Discharge Frequent or constant Supervision/Assistance  Patient can return home with the following A little help with walking and/or transfers;A lot of help with bathing/dressing/bathroom;Assistance with feeding;Assist for transportation;Help with stairs or ramp for entrance   Equipment Recommendations  Other (comment) (TBA)    Recommendations for Other Services       Precautions / Restrictions Precautions Precautions: Fall Precaution Comments: tube feed     Mobility  Bed Mobility Overal bed mobility: Needs Assistance Bed Mobility: Supine  to Sit     Supine to sit: Mod assist, +2 for safety/equipment, HOB elevated     General bed mobility comments: max vc for sequencing, assist with BLE and trunk elevation    Transfers Overall transfer level: Needs assistance Equipment used: Rolling walker (2 wheels) Transfers: Sit to/from Stand, Bed to chair/wheelchair/BSC Sit to Stand: Min assist, +2 safety/equipment   Step pivot transfers: Min assist, +2 safety/equipment       General transfer comment: balance for transfers/mobility greatly improved with RW this session. multimodal cues for sequencing and initiating, but only min A for boost and balance    Ambulation/Gait                   Stairs             Wheelchair Mobility    Modified Rankin (Stroke Patients Only)       Balance Overall balance assessment: Needs assistance Sitting-balance support: Single extremity supported, Feet supported Sitting balance-Leahy Scale: Fair Sitting balance - Comments: progressing to short bouts of supervision sitting EOB - but mostly min guard   Standing balance support: Bilateral upper extremity supported, During functional activity Standing balance-Leahy Scale: Poor Standing balance comment: UE support and minA for balance                            Cognition Arousal/Alertness: Awake/alert Behavior During Therapy: WFL for tasks assessed/performed Overall Cognitive Status: No family/caregiver present to determine baseline cognitive functioning Area of Impairment: Orientation, Attention, Memory, Following commands, Safety/judgement, Awareness, Problem solving  Orientation Level: Disoriented to, Situation, Time Current Attention Level: Focused Memory: Decreased short-term memory Following Commands: Follows one step commands with increased time Safety/Judgement: Decreased awareness of safety, Decreased awareness of deficits Awareness: Intellectual Problem Solving: Slow  processing, Decreased initiation, Requires verbal cues, Difficulty sequencing, Requires tactile cues General Comments: Pt with known dx of dementia, knew he was at [facility] today. Cooperative and following one step directions with multimodal cues - Pt forgetting task at hand frequently. Very cooperative throughout session        Exercises      General Comments General comments (skin integrity, edema, etc.): positive orthostatic BP 127/75 EOB and 94/58 standing with symptoms of light headedness      Pertinent Vitals/Pain Pain Assessment Pain Assessment: Faces Faces Pain Scale: Hurts little more Pain Location: back (chronic)/discomfort from feeding tube Pain Descriptors / Indicators: Grimacing, Sore, Aching Pain Intervention(s): Monitored during session, Repositioned    Home Living                          Prior Function            PT Goals (current goals can now be found in the care plan section) Acute Rehab PT Goals Patient Stated Goal: unable to state PT Goal Formulation: Patient unable to participate in goal setting Time For Goal Achievement: 09/09/22 Potential to Achieve Goals: Fair Progress towards PT goals: Progressing toward goals;Goals met and updated - see care plan    Frequency    Min 3X/week      PT Plan Current plan remains appropriate    Co-evaluation PT/OT/SLP Co-Evaluation/Treatment: Yes Reason for Co-Treatment: Necessary to address cognition/behavior during functional activity;For patient/therapist safety;To address functional/ADL transfers PT goals addressed during session: Mobility/safety with mobility;Balance;Proper use of DME        AM-PAC PT "6 Clicks" Mobility   Outcome Measure  Help needed turning from your back to your side while in a flat bed without using bedrails?: A Lot Help needed moving from lying on your back to sitting on the side of a flat bed without using bedrails?: A Lot Help needed moving to and from a bed to a chair  (including a wheelchair)?: Total Help needed standing up from a chair using your arms (e.g., wheelchair or bedside chair)?: Total Help needed to walk in hospital room?: Total Help needed climbing 3-5 steps with a railing? : Total 6 Click Score: 8    End of Session Equipment Utilized During Treatment: Gait belt Activity Tolerance: Patient tolerated treatment well Patient left: in chair;with chair alarm set;with call bell/phone within reach Nurse Communication: Mobility status PT Visit Diagnosis: Unsteadiness on feet (R26.81);Muscle weakness (generalized) (M62.81)     Time: 8295-6213 PT Time Calculation (min) (ACUTE ONLY): 26 min  Charges:  $Therapeutic Activity: 8-22 mins                     Sheran Lawless, PT Acute Rehabilitation Services [phone] 08/26/2022    Elray Mcgregor 08/26/2022, 4:52 PM

## 2022-08-26 NOTE — TOC Progression Note (Addendum)
Transition of Care [facility]) - Progression Note    Patient Details  Name: Charles Fox MRN: [id number] Date of Birth: November 10, 1950  Transition of Care [facility]) CM/SW Contact  Tory Emerald, [place] Phone Number: 08/26/2022, 3:03 PM  Clinical Narrative:    CSW attempted to contact pt's GOP to discuss bed offers; CSW left a voicemail asking for a returned call.  GOP- Ms. Janee Morn returned call. CSW reviewed 3 bed offers and then text bed offers, per her request. She will f/u Monday morning with choice.    Expected Discharge Plan: Skilled Nursing Facility Barriers to Discharge: Continued Medical Work up  Expected Discharge Plan and Services Expected Discharge Plan: Skilled Nursing Facility                                               Social Determinants of Health (SDOH) Interventions    Readmission Risk Interventions     No data to display

## 2022-08-27 LAB — GLUCOSE, CAPILLARY
Glucose-Capillary: 204 mg/dL — ABNORMAL HIGH (ref 70–99)
Glucose-Capillary: 218 mg/dL — ABNORMAL HIGH (ref 70–99)
Glucose-Capillary: 242 mg/dL — ABNORMAL HIGH (ref 70–99)
Glucose-Capillary: 234 mg/dL — ABNORMAL HIGH (ref 70–99)

## 2022-08-27 LAB — CBC
HCT: 31.4 % — ABNORMAL LOW (ref 39.0–52.0)
Hemoglobin: 10.2 g/dL — ABNORMAL LOW (ref 13.0–17.0)
MCH: 30 pg (ref 26.0–34.0)
MCHC: 32.5 g/dL (ref 30.0–36.0)
MCV: 92.4 fL (ref 80.0–100.0)
Platelets: 671 10*3/uL — ABNORMAL HIGH (ref 150–400)
RBC: 3.4 MIL/uL — ABNORMAL LOW (ref 4.22–5.81)
RDW: 16.1 % — ABNORMAL HIGH (ref 11.5–15.5)
WBC: 10 10*3/uL (ref 4.0–10.5)
nRBC: 0 % (ref 0.0–0.2)

## 2022-08-27 LAB — COMPREHENSIVE METABOLIC PANEL
ALT: 102 U/L — ABNORMAL HIGH (ref 0–44)
AST: 61 U/L — ABNORMAL HIGH (ref 15–41)
Albumin: 2.4 g/dL — ABNORMAL LOW (ref 3.5–5.0)
Alkaline Phosphatase: 225 U/L — ABNORMAL HIGH (ref 38–126)
Anion gap: 9 (ref 5–15)
BUN: 13 mg/dL (ref 8–23)
CO2: 24 mmol/L (ref 22–32)
Calcium: 8.5 mg/dL — ABNORMAL LOW (ref 8.9–10.3)
Chloride: 102 mmol/L (ref 98–111)
Creatinine, Ser: 0.62 mg/dL (ref 0.61–1.24)
GFR, Estimated: >60 mL/min (ref >60)
Glucose, Bld: 154 mg/dL — ABNORMAL HIGH (ref 70–99)
Potassium: 4.6 mmol/L (ref 3.5–5.1)
Sodium: 135 mmol/L (ref 135–145)
Total Bilirubin: 0.6 mg/dL (ref 0.3–1.2)
Total Protein: 6.7 g/dL (ref 6.5–8.1)

## 2022-08-27 LAB — MAGNESIUM: Magnesium: 1.9 mg/dL (ref 1.7–2.4)

## 2022-08-27 MED ORDER — IRBESARTAN 150 MG PO TABS
150.0000 mg | ORAL_TABLET | Freq: Every day | ORAL | Status: DC
  Administered 2022-08-27 – 2022-08-29 (×3): 150 mg via ORAL
  Filled 2022-08-27 (×3): qty 1

## 2022-08-27 MED ORDER — INSULIN GLARGINE-YFGN 100 UNIT/ML ~~LOC~~ SOLN
8.0000 [IU] | Freq: Every day | SUBCUTANEOUS | Status: DC
  Administered 2022-08-27 – 2022-08-28 (×2): 8 [IU] via SUBCUTANEOUS
  Filled 2022-08-27 (×3): qty 0.08

## 2022-08-27 MED ORDER — TAMSULOSIN HCL 0.4 MG PO CAPS
0.8000 mg | ORAL_CAPSULE | Freq: Every day | ORAL | Status: DC
  Administered 2022-08-27 – 2022-08-29 (×3): 0.8 mg via ORAL
  Filled 2022-08-27 (×3): qty 2

## 2022-08-27 NOTE — Progress Notes (Addendum)
[facility] PROGRESS NOTE   Charles Fox:[id number] DOB: 12/15/1950 DOA: 08/08/2022  PCP: Pcp, No  Brief History/Interval Summary: 71 year old man hx of DM2, HTN, HLD, CVA, dementia resides in SNF, ward of the state, presented with AMS, questionable fall and seizure. Nasal fx on imaging. Neuro workup neg (MRI/EEG), question raised of progressive dementia or hypertensive encephalopathy.   Hospital Course:    08/08/22 admit 11/22 worsening mental status 11/23 ICU consult, intubated, started on pressors for profound shock with lactic acidosis. 11/24 Pressor requirements improved 11/29 weaning on vent but variable mentaiton 11/30 extubated 12/2 transfer to [facility]  Consultants: Critical care medicine    Subjective/Interval History: Patient is awake alert.  Eating his breakfast.  Denies any complaints.  Specifically denies any chest pain or shortness of breath.  Assessment/Plan:  Acute metabolic encephalopathy, superimposed on dementia Workup including MRI has been negative. Likely due to septic shock, icu delirium. Delirium precautions ongoing. He does have underlying dementia. Mentation gradually improving.  Could be close to his baseline now.   HCAP, aspiration PNA Patient has completed course of antibiotics, Ancef.   Aspiration precautions. Respiratory status is stable for the most part.  Scattered wheezing appreciated in the right lung.  Monitor for now.  He does have nebulizer treatments ordered as needed.  Oropharyngeal dysphagia Currently on dysphagia 1 diet.   Speech therapy has been following.  Patient initially required NG feeding tube.  Oral intake gradually improved.  Feeding tube was removed yesterday.     Septic shock w multisystem organ failure (resp failure, liver dysfunction, AKI, improved, encephalopathy) Due to ecoli bacteremia, HCAP, UTI - shock resolved   Urinary retention  -[place] w no hydronephrosis -Foley out 12/1 -difficulty voiding over  the weekend, Foley replaced on 12/3 due to obstruction and ongoing urinary retention.   Needs to be mobilized more before the next voiding trial.  May need to be seen by urology in the outpatient setting as it is quite likely he will need to go to a nursing facility with Foley catheter. Noted to be on Flomax prior to admission.  This will be reinitiated.   Transaminitis  Likely secondary to septic shock LFTs have improved but still remain elevated. No hepatitis panel noted in our system.  Will order for tomorrow. He is noted to be on statin which could also be contributing to mildly elevated LFTs.  Would recommend that this be followed in the outpatient setting every so often. Abdomen is benign.   DM2, Insulin dependent, uncontrolled with hyperglycemia HbA1c 6.6.  He was on glargine while on tube feedings.  Now just on SSI.  CBGs noted to be elevated.  He was on Lantus prior to admission.  Will reinitiate glargine at low-dose.    Essential hypertension/hyperlipidemia  Noted to be on carvedilol.  Blood pressure noted to be elevated.  Patient was also on irbesartan prior to admission.  Since renal function is stable will reinitiate.   ASA, statin ongoing   Anemia, acute secondary to critical illness  No evidence of overt bleeding.  Monitor periodically.   Goals of care -Ward of state  Nasogastric feeding tube was removed yesterday.  Continue to monitor.  Obesity Estimated body mass index is 29.87 kg/m as calculated from the following:   Height as of this encounter: 6' (1.829 m).   Weight as of this encounter: 99.9 kg.  DVT Prophylaxis: Subcutaneous heparin Code Status: Full code Family Communication: No family at bedside Disposition Plan: SNF is  recommended  Status is: Inpatient Remains inpatient appropriate because: Oropharyngeal dysphagia requiring tube feedings      Medications: Scheduled:  acetaminophen (TYLENOL) oral liquid 160 mg/5 mL  650 mg Oral Q6H   Or    acetaminophen  650 mg Rectal Q6H   aspirin  81 mg Oral Daily   atorvastatin  40 mg Oral Daily   carvedilol  6.25 mg Oral BID WC   Chlorhexidine Gluconate Cloth  6 each Topical Daily   citalopram  40 mg Oral Daily   docusate  100 mg Oral BID   feeding supplement (NEPRO CARB STEADY)  237 mL Oral BID BM   heparin injection (subcutaneous)  5,000 Units Subcutaneous Q8H   insulin aspart  0-15 Units Subcutaneous TID WC   insulin aspart  0-5 Units Subcutaneous QHS   multivitamin with minerals  1 tablet Per Tube Daily   nicotine  14 mg Transdermal Daily   mouth rinse  15 mL Mouth Rinse 4 times per day   polyethylene glycol  17 g Oral Daily   QUEtiapine  75 mg Oral QHS   Continuous:   WUJ:[id number], ipratropium-albuterol, ondansetron **OR** ondansetron (ZOFRAN) IV, mouth rinse, phenol  Antibiotics: Anti-infectives (From admission, onward)    Start     Dose/Rate Route Frequency Ordered Stop   08/15/22 1000  ceFAZolin (ANCEF) IVPB 2g/100 mL premix        2 g 200 mL/hr over 30 Minutes Intravenous Every 8 hours 08/15/22 0912 08/20/22 0700   08/11/22 1200  cefTRIAXone (ROCEPHIN) 2 g in sodium chloride 0.9 % 100 mL IVPB  Status:  Discontinued        2 g 200 mL/hr over 30 Minutes Intravenous Daily 08/11/22 1126 08/15/22 0912   08/11/22 0615  vancomycin (VANCOREADY) IVPB 2000 mg/400 mL        2,000 mg 200 mL/hr over 120 Minutes Intravenous  Once 08/11/22 0515 08/11/22 0821   08/11/22 0515  vancomycin variable dose per unstable renal function (pharmacist dosing)  Status:  Discontinued         Does not apply See admin instructions 08/11/22 0515 08/11/22 1126   08/10/22 2315  Ampicillin-Sulbactam (UNASYN) 3 g in sodium chloride 0.9 % 100 mL IVPB  Status:  Discontinued        3 g 200 mL/hr over 30 Minutes Intravenous Every 6 hours 08/10/22 2223 08/11/22 1126       Objective:  Vital Signs  Vitals:   08/26/22 2321 08/26/22 2346 08/27/22 0412 08/27/22 0814  BP: (!) 169/77 (!) 163/74 (!)  160/73 (!) 162/72  Pulse: (!) 120 78 88 87  Resp: 16 18 16 17   Temp: 98.8 F (37.1 C) 98.7 F (37.1 C) 98.6 F (37 C) 98.4 F (36.9 C)  TempSrc: Oral Axillary Oral Oral  SpO2: 98% 95% 97% 97%  Weight:      Height:        Intake/Output Summary (Last 24 hours) at 08/27/2022 1028 Last data filed at 08/27/2022 0800 Gross per 24 hour  Intake 1440 ml  Output 2950 ml  Net -1510 ml    Filed Weights   08/23/22 0500 08/25/22 0424 08/26/22 0343  Weight: 103.7 kg 100.4 kg 99.9 kg    General appearance: Awake alert.  In no distress Resp: Occasional wheezing heard in the right lung.  No definite crackles.  No rhonchi. Cardio: S1-S2 is normal regular.  No S3-S4.  No rubs murmurs or bruit GI: Abdomen is soft.  Nontender nondistended.  Bowel sounds are  present normal.  No masses organomegaly Extremities: No edema.     Lab Results:  Data Reviewed: I have personally reviewed following labs and reports of the imaging studies  CBC: Recent Labs  Lab 08/25/22 0547 08/27/22 0320  WBC 10.8* 10.0  HGB 9.6* 10.2*  HCT 30.2* 31.4*  MCV 95.0 92.4  PLT 623* 671*     Basic Metabolic Panel: Recent Labs  Lab 08/25/22 0547 08/27/22 0320  NA 136 135  K 4.4 4.6  CL 106 102  CO2 24 24  GLUCOSE 150* 154*  BUN 15 13  CREATININE 0.60* 0.62  CALCIUM 8.8* 8.5*  MG 1.9 1.9  PHOS 3.4  --      GFR: Estimated Creatinine Clearance: 106.6 mL/min (by C-G formula based on SCr of 0.62 mg/dL).  Liver Function Tests: Recent Labs  Lab 08/25/22 0547 08/27/22 0320  AST 64* 61*  ALT 84* 102*  ALKPHOS 236* 225*  BILITOT 0.4 0.6  PROT 6.6 6.7  ALBUMIN 2.4* 2.4*     CBG: Recent Labs  Lab 08/26/22 0745 08/26/22 1132 08/26/22 1627 08/26/22 2205 08/27/22 0656  GLUCAP 85 135* 177* 201* 204*      Radiology Studies: No results found.     LOS: 19 days   Shantese Raven [employer] on [email or web address]  08/27/2022, 10:28 AM

## 2022-08-27 NOTE — Plan of Care (Signed)
  Problem: Education: Goal: Knowledge of disease or condition will improve Outcome: Not Progressing   Problem: Health Behavior/Discharge Planning: Goal: Ability to manage health-related needs will improve Outcome: Not Progressing   Problem: Self-Care: Goal: Ability to participate in self-care as condition permits will improve Outcome: Not Progressing   

## 2022-08-28 LAB — GLUCOSE, CAPILLARY
Glucose-Capillary: 267 mg/dL — ABNORMAL HIGH (ref 70–99)
Glucose-Capillary: 306 mg/dL — ABNORMAL HIGH (ref 70–99)
Glucose-Capillary: 206 mg/dL — ABNORMAL HIGH (ref 70–99)

## 2022-08-28 LAB — BASIC METABOLIC PANEL
Anion gap: 9 (ref 5–15)
BUN: 21 mg/dL (ref 8–23)
CO2: 24 mmol/L (ref 22–32)
Calcium: 8.7 mg/dL — ABNORMAL LOW (ref 8.9–10.3)
Chloride: 102 mmol/L (ref 98–111)
Creatinine, Ser: 0.68 mg/dL (ref 0.61–1.24)
GFR, Estimated: >60 mL/min (ref >60)
Glucose, Bld: 177 mg/dL — ABNORMAL HIGH (ref 70–99)
Potassium: 4.8 mmol/L (ref 3.5–5.1)
Sodium: 135 mmol/L (ref 135–145)

## 2022-08-28 LAB — HEPATITIS PANEL, ACUTE
HCV Ab: NONREACTIVE
Hep A IgM: NONREACTIVE
Hep B C IgM: NONREACTIVE
Hepatitis B Surface Ag: NONREACTIVE

## 2022-08-28 MED ORDER — SACCHAROMYCES BOULARDII 250 MG PO CAPS
250.0000 mg | ORAL_CAPSULE | Freq: Two times a day (BID) | ORAL | Status: DC
  Administered 2022-08-28 – 2022-08-29 (×3): 250 mg via ORAL
  Filled 2022-08-28 (×4): qty 1

## 2022-08-28 NOTE — Plan of Care (Signed)
  Problem: Nutrition: Goal: Dietary intake will improve Outcome: Progressing   Problem: Education: Goal: Knowledge of disease or condition will improve Outcome: Not Progressing   Problem: Health Behavior/Discharge Planning: Goal: Ability to manage health-related needs will improve Outcome: Not Progressing   Problem: Nutrition: Goal: Risk of aspiration will decrease Outcome: Not Progressing

## 2022-08-28 NOTE — Progress Notes (Signed)
[facility] PROGRESS NOTE   Charles Fox EXB:[id number] DOB: 1951-01-08 DOA: 08/08/2022  PCP: Pcp, No  Brief History/Interval Summary: 71 year old man hx of DM2, HTN, HLD, CVA, dementia resides in SNF, ward of the state, presented with AMS, questionable fall and seizure. Nasal fx on imaging. Neuro workup neg (MRI/EEG), question raised of progressive dementia or hypertensive encephalopathy.   Hospital Course:    08/08/22 admit 11/22 worsening mental status 11/23 ICU consult, intubated, started on pressors for profound shock with lactic acidosis. 11/24 Pressor requirements improved 11/29 weaning on vent but variable mentaiton 11/30 extubated 12/2 transfer to [facility]  Consultants: Critical care medicine    Subjective/Interval History: Patient is awake alert.  Eating his breakfast.  Denies any complaints.  Per nursing staff patient has been having frequent bowel movements.    Assessment/Plan:  Acute metabolic encephalopathy, superimposed on dementia Workup including MRI has been negative. Likely due to septic shock, icu delirium. Delirium precautions ongoing. He does have underlying dementia. Mentation gradually improving.  Could be close to his baseline now.   HCAP, aspiration PNA Patient has completed course of antibiotics, Ancef.   Aspiration precautions. Respiratory status is stable for the most part.  Scattered wheezing appreciated in the right lung.  Monitor for now.  He does have nebulizer treatments ordered as needed.  Oropharyngeal dysphagia Currently on dysphagia 1 diet.   Speech therapy has been following.  Patient initially required NG feeding tube.  Oral intake gradually improved.  Feeding tube was removed on 12/8.   Seems to be tolerating his diet well.  Continue to encourage oral intake.   Septic shock w multisystem organ failure (resp failure, liver dysfunction, AKI, improved, encephalopathy) Due to ecoli bacteremia, HCAP, UTI - shock resolved    Urinary retention  -[place] w no hydronephrosis -Foley out 12/1 -difficulty voiding over the weekend, Foley replaced on 12/3 due to obstruction and ongoing urinary retention.   Needs to be mobilized more before the next voiding trial.  May need to be seen by urology in the outpatient setting as it is quite likely he will need to go to a nursing facility with Foley catheter. Flomax was reinitiated.  Frequent bowel movements Abdomen is benign.  Does not have any fever.  Do not suspect infectious etiology.  It is not watery.  Laxatives have been discontinued.  Start probiotics.   Transaminitis  Likely secondary to septic shock LFTs have improved but still remain elevated. Hepatitis panel is pending. He is noted to be on statin which could also be contributing to mildly elevated LFTs.  Would recommend that this be followed in the outpatient setting every so often. Abdomen is benign.   DM2, Insulin dependent, uncontrolled with hyperglycemia HbA1c 6.6.  He was on glargine while on tube feedings.  Now just on SSI.  CBGs noted to be elevated.  He was on Lantus prior to admission.  Glargine was initiated at low-dose yesterday.  CBGs to be trended.  May need to his adjustments.  Essential hypertension/hyperlipidemia  Noted to be on carvedilol.  Blood pressure noted to be elevated.  Patient was also on irbesartan prior to admission.  This was reinitiated yesterday.  Continue to monitor blood pressure trends.   ASA, statin ongoing   Anemia, acute secondary to critical illness  No evidence of overt bleeding.  Monitor periodically.   Goals of care -Ward of state  Nasogastric feeding tube was removed.  Continue to monitor.  Obesity Estimated body mass index is 29.87  kg/m as calculated from the following:   Height as of this encounter: 6' (1.829 m).   Weight as of this encounter: 99.9 kg.  DVT Prophylaxis: Subcutaneous heparin Code Status: Full code Family Communication: No family at  bedside Disposition Plan: Anticipate discharge to SNF in 1 to 2 days.  Status is: Inpatient Remains inpatient appropriate because: Oropharyngeal dysphagia requiring tube feedings      Medications: Scheduled:  acetaminophen (TYLENOL) oral liquid 160 mg/5 mL  650 mg Oral Q6H   Or   acetaminophen  650 mg Rectal Q6H   aspirin  81 mg Oral Daily   atorvastatin  40 mg Oral Daily   carvedilol  6.25 mg Oral BID WC   Chlorhexidine Gluconate Cloth  6 each Topical Daily   citalopram  40 mg Oral Daily   feeding supplement (NEPRO CARB STEADY)  237 mL Oral BID BM   heparin injection (subcutaneous)  5,000 Units Subcutaneous Q8H   insulin aspart  0-15 Units Subcutaneous TID WC   insulin aspart  0-5 Units Subcutaneous QHS   insulin glargine-yfgn  8 Units Subcutaneous Daily   irbesartan  150 mg Oral Daily   multivitamin with minerals  1 tablet Per Tube Daily   nicotine  14 mg Transdermal Daily   mouth rinse  15 mL Mouth Rinse 4 times per day   QUEtiapine  75 mg Oral QHS   saccharomyces boulardii  250 mg Oral BID   tamsulosin  0.8 mg Oral QPC breakfast   Continuous:   ZOX:[id number], ipratropium-albuterol, ondansetron **OR** ondansetron (ZOFRAN) IV, mouth rinse, phenol  Antibiotics: Anti-infectives (From admission, onward)    Start     Dose/Rate Route Frequency Ordered Stop   08/15/22 1000  ceFAZolin (ANCEF) IVPB 2g/100 mL premix        2 g 200 mL/hr over 30 Minutes Intravenous Every 8 hours 08/15/22 0912 08/20/22 0700   08/11/22 1200  cefTRIAXone (ROCEPHIN) 2 g in sodium chloride 0.9 % 100 mL IVPB  Status:  Discontinued        2 g 200 mL/hr over 30 Minutes Intravenous Daily 08/11/22 1126 08/15/22 0912   08/11/22 0615  vancomycin (VANCOREADY) IVPB 2000 mg/400 mL        2,000 mg 200 mL/hr over 120 Minutes Intravenous  Once 08/11/22 0515 08/11/22 0821   08/11/22 0515  vancomycin variable dose per unstable renal function (pharmacist dosing)  Status:  Discontinued         Does not apply  See admin instructions 08/11/22 0515 08/11/22 1126   08/10/22 2315  Ampicillin-Sulbactam (UNASYN) 3 g in sodium chloride 0.9 % 100 mL IVPB  Status:  Discontinued        3 g 200 mL/hr over 30 Minutes Intravenous Every 6 hours 08/10/22 2223 08/11/22 1126       Objective:  Vital Signs  Vitals:   08/27/22 1946 08/27/22 2337 08/28/22 0400 08/28/22 0758  BP: (!) 141/70 134/61 (!) 155/76 (!) 153/81  Pulse: 81 88 98 (!) 104  Resp: 18 19 17 17   Temp: 98.7 F (37.1 C) 98 F (36.7 C) 98.5 F (36.9 C) 98.7 F (37.1 C)  TempSrc: Oral   Oral  SpO2: 99%  97% 99%  Weight:      Height:        Intake/Output Summary (Last 24 hours) at 08/28/2022 0930 Last data filed at 08/28/2022 0800 Gross per 24 hour  Intake 1600 ml  Output 1250 ml  Net 350 ml    [employer]  08/23/22 0500 08/25/22 0424 08/26/22 0343  Weight: 103.7 kg 100.4 kg 99.9 kg    General appearance: Awake alert.  In no distress Resp: Clear to auscultation bilaterally.  Normal effort Cardio: S1-S2 is normal regular.  No S3-S4.  No rubs murmurs or bruit GI: Abdomen is soft.  Nontender nondistended.  Bowel sounds are present normal.  No masses organomegaly Extremities: No edema.     Lab Results:  Data Reviewed: I have personally reviewed following labs and reports of the imaging studies  CBC: Recent Labs  Lab 08/25/22 0547 08/27/22 0320  WBC 10.8* 10.0  HGB 9.6* 10.2*  HCT 30.2* 31.4*  MCV 95.0 92.4  PLT 623* 671*     Basic Metabolic Panel: Recent Labs  Lab 08/25/22 0547 08/27/22 0320 08/28/22 0250  NA 136 135 135  K 4.4 4.6 4.8  CL 106 102 102  CO2 24 24 24   GLUCOSE 150* 154* 177*  BUN 15 13 21   CREATININE 0.60* 0.62 0.68  CALCIUM 8.8* 8.5* 8.7*  MG 1.9 1.9  --   PHOS 3.4  --   --      GFR: Estimated Creatinine Clearance: 106.6 mL/min (by C-G formula based on SCr of 0.68 mg/dL).  Liver Function Tests: Recent Labs  Lab 08/25/22 0547 08/27/22 0320  AST 64* 61*  ALT 84* 102*  ALKPHOS  236* 225*  BILITOT 0.4 0.6  PROT 6.6 6.7  ALBUMIN 2.4* 2.4*     CBG: Recent Labs  Lab 08/26/22 2205 08/27/22 0656 08/27/22 1114 08/27/22 1611 08/27/22 2111  GLUCAP 201* 204* 218* 242* 234*      Radiology Studies: No results found.     LOS: 20 days   Anaston Koehn [employer] on [email or web address]  08/28/2022, 9:30 AM

## 2022-08-29 ENCOUNTER — Encounter (HOSPITAL_COMMUNITY): Payer: Self-pay | Admitting: Infectious Diseases

## 2022-08-29 LAB — GLUCOSE, CAPILLARY
Glucose-Capillary: 173 mg/dL — ABNORMAL HIGH (ref 70–99)
Glucose-Capillary: 205 mg/dL — ABNORMAL HIGH (ref 70–99)
Glucose-Capillary: 239 mg/dL — ABNORMAL HIGH (ref 70–99)

## 2022-08-29 MED ORDER — SACCHAROMYCES BOULARDII 250 MG PO CAPS: 250.0000 mg | ORAL_CAPSULE | Freq: Two times a day (BID) | ORAL | Status: AC

## 2022-08-29 MED ORDER — NEPRO/CARBSTEADY PO LIQD: 237.0000 mL | Freq: Two times a day (BID) | ORAL | 0 refills | Status: AC

## 2022-08-29 MED ORDER — INSULIN GLARGINE-YFGN 100 UNIT/ML ~~LOC~~ SOLN
12.0000 [IU] | Freq: Every day | SUBCUTANEOUS | Status: DC
  Administered 2022-08-29: 12 [IU] via SUBCUTANEOUS
  Filled 2022-08-29: qty 0.12

## 2022-08-29 MED ORDER — IPRATROPIUM-ALBUTEROL 0.5-2.5 (3) MG/3ML IN SOLN: 3.0000 mL | RESPIRATORY_TRACT | Status: AC | PRN

## 2022-08-29 MED ORDER — INSULIN GLARGINE 100 UNIT/ML SOLOSTAR PEN: 12.0000 [IU] | PEN_INJECTOR | Freq: Every day | SUBCUTANEOUS | 11 refills | Status: AC

## 2022-08-29 NOTE — TOC Progression Note (Signed)
Transition of Care [facility]) - Progression Note    Patient Details  Name: Charles Fox MRN: [id number] Date of Birth: 1951/05/21  Transition of Care [facility]) CM/SW Contact  Tory Emerald, [place] Phone Number: 08/29/2022, 8:40 AM  Clinical Narrative:     CSW continued conversation with GOP re: bed offers; GOP chose [facility]. CSW called [facility] and was told they have 7 admissions and won't have a bed for several days. CSW followed up with GOP to provide update and to inquire about the other bed option.   Expected Discharge Plan: Skilled Nursing Facility Barriers to Discharge: Continued Medical Work up  Expected Discharge Plan and Services Expected Discharge Plan: Skilled Nursing Facility                                               Social Determinants of Health (SDOH) Interventions    Readmission Risk Interventions     No data to display

## 2022-08-29 NOTE — Plan of Care (Signed)
Problem: Education: Goal: Knowledge of disease or condition will improve Outcome: Adequate for Discharge Goal: Knowledge of secondary prevention will improve (MUST DOCUMENT ALL) Outcome: Adequate for Discharge Goal: Knowledge of patient specific risk factors will improve (Mark N/A or DELETE if not current risk factor) Outcome: Adequate for Discharge   Problem: Ischemic Stroke/TIA Tissue Perfusion: Goal: Complications of ischemic stroke/TIA will be minimized Outcome: Adequate for Discharge   Problem: Coping: Goal: Will verbalize positive feelings about self Outcome: Adequate for Discharge Goal: Will identify appropriate support needs Outcome: Adequate for Discharge   Problem: Health Behavior/Discharge Planning: Goal: Ability to manage health-related needs will improve Outcome: Adequate for Discharge Goal: Goals will be collaboratively established with patient/family Outcome: Adequate for Discharge   Problem: Self-Care: Goal: Ability to participate in self-care as condition permits will improve Outcome: Adequate for Discharge Goal: Verbalization of feelings and concerns over difficulty with self-care will improve Outcome: Adequate for Discharge Goal: Ability to communicate needs accurately will improve Outcome: Adequate for Discharge   Problem: Nutrition: Goal: Risk of aspiration will decrease Outcome: Adequate for Discharge Goal: Dietary intake will improve Outcome: Adequate for Discharge   Problem: Education: Goal: Ability to describe self-care measures that may prevent or decrease complications (Diabetes Survival Skills Education) will improve Outcome: Adequate for Discharge Goal: Individualized Educational Video(s) Outcome: Adequate for Discharge   Problem: Coping: Goal: Ability to adjust to condition or change in health will improve Outcome: Adequate for Discharge   Problem: Fluid Volume: Goal: Ability to maintain a balanced intake and output will  improve Outcome: Adequate for Discharge   Problem: Health Behavior/Discharge Planning: Goal: Ability to identify and utilize available resources and services will improve Outcome: Adequate for Discharge Goal: Ability to manage health-related needs will improve Outcome: Adequate for Discharge   Problem: Metabolic: Goal: Ability to maintain appropriate glucose levels will improve Outcome: Adequate for Discharge   Problem: Nutritional: Goal: Maintenance of adequate nutrition will improve Outcome: Adequate for Discharge Goal: Progress toward achieving an optimal weight will improve Outcome: Adequate for Discharge   Problem: Skin Integrity: Goal: Risk for impaired skin integrity will decrease Outcome: Adequate for Discharge   Problem: Tissue Perfusion: Goal: Adequacy of tissue perfusion will improve Outcome: Adequate for Discharge   Problem: Education: Goal: Knowledge of General Education information will improve Description: Including pain rating scale, medication(s)/side effects and non-pharmacologic comfort measures Outcome: Adequate for Discharge   Problem: Health Behavior/Discharge Planning: Goal: Ability to manage health-related needs will improve Outcome: Adequate for Discharge   Problem: Clinical Measurements: Goal: Ability to maintain clinical measurements within normal limits will improve Outcome: Adequate for Discharge Goal: Will remain free from infection Outcome: Adequate for Discharge Goal: Diagnostic test results will improve Outcome: Adequate for Discharge Goal: Respiratory complications will improve Outcome: Adequate for Discharge Goal: Cardiovascular complication will be avoided Outcome: Adequate for Discharge   Problem: Activity: Goal: Risk for activity intolerance will decrease Outcome: Adequate for Discharge   Problem: Nutrition: Goal: Adequate nutrition will be maintained Outcome: Adequate for Discharge   Problem: Coping: Goal: Level of  anxiety will decrease Outcome: Adequate for Discharge   Problem: Elimination: Goal: Will not experience complications related to bowel motility Outcome: Adequate for Discharge Goal: Will not experience complications related to urinary retention Outcome: Adequate for Discharge   Problem: Pain Managment: Goal: General experience of comfort will improve Outcome: Adequate for Discharge   Problem: Safety: Goal: Ability to remain free from injury will improve Outcome: Adequate for Discharge   Problem: Skin Integrity: Goal: Risk for   impaired skin integrity will decrease Outcome: Adequate for Discharge   Problem: Activity: Goal: Ability to tolerate increased activity will improve Outcome: Adequate for Discharge   Problem: Respiratory: Goal: Ability to maintain a clear airway and adequate ventilation will improve Outcome: Adequate for Discharge   Problem: Role Relationship: Goal: Method of communication will improve Outcome: Adequate for Discharge   Problem: Activity: Goal: Ability to tolerate increased activity will improve Outcome: Adequate for Discharge   Problem: Role Relationship: Goal: Method of communication will improve Outcome: Adequate for Discharge

## 2022-08-29 NOTE — TOC Transition Note (Signed)
Transition of Care [facility]) - CM/SW Discharge Note   Patient Details  Name: Charles Fox MRN: [id number] Date of Birth: 08-25-1951  Transition of Care [facility]) CM/SW Contact:  Tory Emerald, LCSW Phone Number: 08/29/2022, 1:10 PM   Clinical Narrative:    PT to be transported to [facility] via [facility] Call to report: [phone] Room: 123a  Final next level of care: Skilled Nursing Facility Barriers to Discharge: No Barriers Identified   Patient Goals and CMS Choice   CMS Medicare.gov Compare Post Acute Care list provided to:: Legal Guardian Choice offered to / list presented to : [facility] POA / Guardian  Discharge Placement              Patient chooses bed at: [facility] Patient to be transferred to facility by: [facility] Name of family member notified: GOP, Ms. Thompson Patient and family notified of of transfer: 08/29/22  Discharge Plan and Services                                     Social Determinants of Health (SDOH) Interventions     Readmission Risk Interventions     No data to display

## 2022-08-29 NOTE — TOC Progression Note (Signed)
Transition of Care [facility]) - Progression Note    Patient Details  Name: Charles Fox MRN: [id number] Date of Birth: 10-15-1950  Transition of Care [facility]) CM/SW Contact  Tory Emerald, [place] Phone Number: 08/29/2022, 10:39 AM  Clinical Narrative:    CSW conversed with GOP regarding bed offer; GOP chose [facility]. CSW called [facility] to discuss bed of choice and they report they have 7 admissions and it will be "days" before they have a bed available. CSW has sent text and calls to GOP to further follow up; no answer or response.    Expected Discharge Plan: Skilled Nursing Facility Barriers to Discharge: Continued Medical Work up  Expected Discharge Plan and Services Expected Discharge Plan: Skilled Nursing Facility         Expected Discharge Date: 08/29/22                                     Social Determinants of Health (SDOH) Interventions    Readmission Risk Interventions     No data to display

## 2022-08-29 NOTE — Progress Notes (Signed)
Report called to [facility]. All questions answered.

## 2022-08-29 NOTE — Discharge Summary (Signed)
body mass index is 29.87 kg/m as calculated from the following:   Height as of this encounter: 6' (1.829 m).   Weight as of this encounter: 99.9 kg.   Pressure injury buttocks stage  II Agree with assessment below Pressure Injury 08/20/22 Buttocks Medial Stage 2 -  Partial thickness loss of dermis presenting as a shallow open injury with a red, pink wound bed without slough. (Active)  08/20/22 0800  Location: Buttocks  Location Orientation: Medial  Staging: Stage 2 -  Partial thickness loss of dermis presenting as a shallow open injury with a red, pink wound bed without slough.  Wound Description (Comments):   Present on Admission: No   Patient is stable.  Okay for discharge to SNF.   PERTINENT LABS:  The results of significant diagnostics from this hospitalization (including imaging, microbiology, ancillary and laboratory) are listed below for reference.     Labs:   Basic Metabolic Panel: Recent Labs  Lab 08/25/22 0547 08/27/22 0320 08/28/22 0250  NA 136 135 135  K 4.4 4.6 4.8  CL 106 102 102  CO2 24 24 24   GLUCOSE 150* 154* 177*  BUN 15 13 21   CREATININE 0.60* 0.62 0.68  CALCIUM 8.8* 8.5* 8.7*  MG 1.9 1.9  --   PHOS 3.4  --   --    Liver Function Tests: Recent Labs  Lab 08/25/22 0547 08/27/22 0320  AST 64* 61*  ALT 84* 102*  ALKPHOS 236* 225*  BILITOT 0.4 0.6  PROT 6.6 6.7  ALBUMIN 2.4* 2.4*    CBC: Recent Labs  Lab 08/25/22 0547 08/27/22 0320  WBC 10.8* 10.0  HGB 9.6* 10.2*  HCT 30.2* 31.4*  MCV 95.0 92.4  PLT 623* 671*     CBG: Recent Labs  Lab 08/27/22 2111 08/28/22 1126 08/28/22 1637 08/28/22 2028 08/29/22 0623  GLUCAP 234* 267* 306* 206* 173*     IMAGING STUDIES DG Swallowing Func-Speech Pathology  Result Date: 08/22/2022 Table formatting from the original result was not included. Objective Swallowing Evaluation: Type of Study: MBS-Modified Barium Swallow Study  Patient Details Name: Charles Fox MRN: 14/12/2021 Date of Birth: 1950/11/06 Today's Date: 08/22/2022 Time: SLP Start Time (ACUTE ONLY): 1508 -SLP Stop Time (ACUTE ONLY): 1532 SLP Time Calculation (min) (ACUTE ONLY): 24 min Past Medical History: No  past medical history on file. Past Surgical History: No past surgical history on file. HPI: Pt is a 71 yo M admittted 11/20 after being found down outside of his group home. Pt found to have mild nasal fracture MRI brain 11/20 negative for acute changes. Pt seen by SLP 11/21 and 11/22; a dysphagia 2 diet with thin liquids initiated on 11/22. Pt developed progressively worsening lethargy, increasing hypotension, rising WBC, near obtundation, and SOB and was transferred to the ICU 11/23; ETT 11/23-11/30. Pt found to have sepsis, UTI with septic shock, and PNA. PMH: dementia, T2DM, Htn, HLD, CVA, housing insecurity, and tobacco use disorder. BSE 05/09/21: regular texture diet and thin liquids recommended without need for follow up.  Subjective: pt in bed, asleep - woke and indicated he wanted food/drink  Recommendations for follow up therapy are one component of a multi-disciplinary discharge planning process, led by the attending physician.  Recommendations may be updated based on patient status, additional functional criteria and insurance authorization. Assessment / Plan / Recommendation   08/22/2022   4:12 PM Clinical Impressions Clinical Impression Pt presents with oropharyngeal dysphagia characterized by reduced posterior propulsion, weak lingual manipulation, reduced bolus cohesion and  body mass index is 29.87 kg/m as calculated from the following:   Height as of this encounter: 6' (1.829 m).   Weight as of this encounter: 99.9 kg.   Pressure injury buttocks stage  II Agree with assessment below Pressure Injury 08/20/22 Buttocks Medial Stage 2 -  Partial thickness loss of dermis presenting as a shallow open injury with a red, pink wound bed without slough. (Active)  08/20/22 0800  Location: Buttocks  Location Orientation: Medial  Staging: Stage 2 -  Partial thickness loss of dermis presenting as a shallow open injury with a red, pink wound bed without slough.  Wound Description (Comments):   Present on Admission: No   Patient is stable.  Okay for discharge to SNF.   PERTINENT LABS:  The results of significant diagnostics from this hospitalization (including imaging, microbiology, ancillary and laboratory) are listed below for reference.     Labs:   Basic Metabolic Panel: Recent Labs  Lab 08/25/22 0547 08/27/22 0320 08/28/22 0250  NA 136 135 135  K 4.4 4.6 4.8  CL 106 102 102  CO2 24 24 24   GLUCOSE 150* 154* 177*  BUN 15 13 21   CREATININE 0.60* 0.62 0.68  CALCIUM 8.8* 8.5* 8.7*  MG 1.9 1.9  --   PHOS 3.4  --   --    Liver Function Tests: Recent Labs  Lab 08/25/22 0547 08/27/22 0320  AST 64* 61*  ALT 84* 102*  ALKPHOS 236* 225*  BILITOT 0.4 0.6  PROT 6.6 6.7  ALBUMIN 2.4* 2.4*    CBC: Recent Labs  Lab 08/25/22 0547 08/27/22 0320  WBC 10.8* 10.0  HGB 9.6* 10.2*  HCT 30.2* 31.4*  MCV 95.0 92.4  PLT 623* 671*     CBG: Recent Labs  Lab 08/27/22 2111 08/28/22 1126 08/28/22 1637 08/28/22 2028 08/29/22 0623  GLUCAP 234* 267* 306* 206* 173*     IMAGING STUDIES DG Swallowing Func-Speech Pathology  Result Date: 08/22/2022 Table formatting from the original result was not included. Objective Swallowing Evaluation: Type of Study: MBS-Modified Barium Swallow Study  Patient Details Name: Charles Fox MRN: 14/12/2021 Date of Birth: 1950/11/06 Today's Date: 08/22/2022 Time: SLP Start Time (ACUTE ONLY): 1508 -SLP Stop Time (ACUTE ONLY): 1532 SLP Time Calculation (min) (ACUTE ONLY): 24 min Past Medical History: No  past medical history on file. Past Surgical History: No past surgical history on file. HPI: Pt is a 71 yo M admittted 11/20 after being found down outside of his group home. Pt found to have mild nasal fracture MRI brain 11/20 negative for acute changes. Pt seen by SLP 11/21 and 11/22; a dysphagia 2 diet with thin liquids initiated on 11/22. Pt developed progressively worsening lethargy, increasing hypotension, rising WBC, near obtundation, and SOB and was transferred to the ICU 11/23; ETT 11/23-11/30. Pt found to have sepsis, UTI with septic shock, and PNA. PMH: dementia, T2DM, Htn, HLD, CVA, housing insecurity, and tobacco use disorder. BSE 05/09/21: regular texture diet and thin liquids recommended without need for follow up.  Subjective: pt in bed, asleep - woke and indicated he wanted food/drink  Recommendations for follow up therapy are one component of a multi-disciplinary discharge planning process, led by the attending physician.  Recommendations may be updated based on patient status, additional functional criteria and insurance authorization. Assessment / Plan / Recommendation   08/22/2022   4:12 PM Clinical Impressions Clinical Impression Pt presents with oropharyngeal dysphagia characterized by reduced posterior propulsion, weak lingual manipulation, reduced bolus cohesion and  [facility]  Physician Discharge Summary   Patient ID: Charles Fox MRN: [id number] DOB/AGE: 1951-08-13 71 y.o.  Admit date: 08/08/2022 Discharge date:   08/29/2022   PCP: Pcp, No  DISCHARGE DIAGNOSES:    Controlled diabetes mellitus type 2 with complications (HCC)   AKI (acute kidney injury) (HCC)   Essential hypertension   Encephalopathy acute   Septic shock due to Escherichia coli (HCC)   E coli bacteremia   Sepsis due to Escherichia coli with acute renal failure and septic shock (HCC)   RECOMMENDATIONS FOR OUTPATIENT FOLLOW UP: Please refer to urology for voiding trial after a few days of rehab CBC and basic metabolic panel on a weekly basis Monitor CBGs.  Sliding scale coverage per SNF protocol. Speech therapy to follow at skilled nursing facility   Home Health: SNF Equipment/Devices: None  CODE STATUS: Full code  DISCHARGE CONDITION: fair  Diet recommendation: Dysphagia 1 diet with nectar thick liquids  INITIAL HISTORY: 71 year old man hx of DM2, HTN, HLD, CVA, dementia resides in SNF, ward of the state, presented with AMS, questionable fall and seizure. Nasal fx on imaging. Neuro workup neg (MRI/EEG), question raised of progressive dementia or hypertensive encephalopathy.    Hospital Course:  08/08/22 admit 11/22 worsening mental status 11/23 ICU consult, intubated, started on pressors for profound shock with lactic acidosis. 11/24 Pressor requirements improved 11/29 weaning on vent but variable mentaiton 11/30 extubated 12/2 transfer to [facility]    HOSPITAL COURSE:   Acute metabolic encephalopathy, superimposed on dementia Workup including MRI has been negative. Likely due to septic shock, icu delirium. Delirium precautions ongoing. He does have underlying dementia. Mentation gradually improving.  Probably close to baseline now.   HCAP, aspiration PNA Patient has completed course of antibiotics, Ancef.   Aspiration  precautions. Respiratory status is stable for the most part.  Saturating normal on room air.   Oropharyngeal dysphagia Currently on dysphagia 1 diet.   Speech therapy has been following.  Patient initially required NG feeding tube.  Oral intake gradually improved.  Feeding tube was removed on 12/8.   Seems to be tolerating his diet well.  Continue to encourage oral intake. Speech therapy to follow at skilled nursing facility   Septic shock w multisystem organ failure (resp failure, liver dysfunction, AKI, improved, encephalopathy) Due to ecoli bacteremia, HCAP, UTI - shock resolved   Urinary retention  -[place] w no hydronephrosis -Foley out 12/1 -difficulty voiding over the weekend, Foley replaced on 12/3 due to obstruction and ongoing urinary retention.   Will need referral to urology after his mobility has improved for voiding trial.  Continue Flomax and finasteride.   Frequent bowel movements Abdomen is benign.  Does not have any fever.  Do not suspect infectious etiology.  It is not watery.  Laxatives have been discontinued.  Continue probiotics.   Transaminitis  Likely secondary to septic shock LFTs have improved but still remain elevated. Hepatitis panel is negative He is noted to be on statin which could also be contributing to mildly elevated LFTs.  Would recommend that this be followed in the outpatient setting every so often.   DM2, Insulin dependent, uncontrolled with hyperglycemia HbA1c 6.6.  Continue with Lantus at a slightly lower dose.  Monitor CBGs.     Essential hypertension/hyperlipidemia  Continue irbesartan and carvedilol. ASA, statin ongoing   Anemia, acute secondary to critical illness  No evidence of overt bleeding.    Goals of care -Ward of state    Obesity Estimated  [facility]  Physician Discharge Summary   Patient ID: Charles Fox MRN: [id number] DOB/AGE: 1951-08-13 71 y.o.  Admit date: 08/08/2022 Discharge date:   08/29/2022   PCP: Pcp, No  DISCHARGE DIAGNOSES:    Controlled diabetes mellitus type 2 with complications (HCC)   AKI (acute kidney injury) (HCC)   Essential hypertension   Encephalopathy acute   Septic shock due to Escherichia coli (HCC)   E coli bacteremia   Sepsis due to Escherichia coli with acute renal failure and septic shock (HCC)   RECOMMENDATIONS FOR OUTPATIENT FOLLOW UP: Please refer to urology for voiding trial after a few days of rehab CBC and basic metabolic panel on a weekly basis Monitor CBGs.  Sliding scale coverage per SNF protocol. Speech therapy to follow at skilled nursing facility   Home Health: SNF Equipment/Devices: None  CODE STATUS: Full code  DISCHARGE CONDITION: fair  Diet recommendation: Dysphagia 1 diet with nectar thick liquids  INITIAL HISTORY: 71 year old man hx of DM2, HTN, HLD, CVA, dementia resides in SNF, ward of the state, presented with AMS, questionable fall and seizure. Nasal fx on imaging. Neuro workup neg (MRI/EEG), question raised of progressive dementia or hypertensive encephalopathy.    Hospital Course:  08/08/22 admit 11/22 worsening mental status 11/23 ICU consult, intubated, started on pressors for profound shock with lactic acidosis. 11/24 Pressor requirements improved 11/29 weaning on vent but variable mentaiton 11/30 extubated 12/2 transfer to [facility]    HOSPITAL COURSE:   Acute metabolic encephalopathy, superimposed on dementia Workup including MRI has been negative. Likely due to septic shock, icu delirium. Delirium precautions ongoing. He does have underlying dementia. Mentation gradually improving.  Probably close to baseline now.   HCAP, aspiration PNA Patient has completed course of antibiotics, Ancef.   Aspiration  precautions. Respiratory status is stable for the most part.  Saturating normal on room air.   Oropharyngeal dysphagia Currently on dysphagia 1 diet.   Speech therapy has been following.  Patient initially required NG feeding tube.  Oral intake gradually improved.  Feeding tube was removed on 12/8.   Seems to be tolerating his diet well.  Continue to encourage oral intake. Speech therapy to follow at skilled nursing facility   Septic shock w multisystem organ failure (resp failure, liver dysfunction, AKI, improved, encephalopathy) Due to ecoli bacteremia, HCAP, UTI - shock resolved   Urinary retention  -[place] w no hydronephrosis -Foley out 12/1 -difficulty voiding over the weekend, Foley replaced on 12/3 due to obstruction and ongoing urinary retention.   Will need referral to urology after his mobility has improved for voiding trial.  Continue Flomax and finasteride.   Frequent bowel movements Abdomen is benign.  Does not have any fever.  Do not suspect infectious etiology.  It is not watery.  Laxatives have been discontinued.  Continue probiotics.   Transaminitis  Likely secondary to septic shock LFTs have improved but still remain elevated. Hepatitis panel is negative He is noted to be on statin which could also be contributing to mildly elevated LFTs.  Would recommend that this be followed in the outpatient setting every so often.   DM2, Insulin dependent, uncontrolled with hyperglycemia HbA1c 6.6.  Continue with Lantus at a slightly lower dose.  Monitor CBGs.     Essential hypertension/hyperlipidemia  Continue irbesartan and carvedilol. ASA, statin ongoing   Anemia, acute secondary to critical illness  No evidence of overt bleeding.    Goals of care -Ward of state    Obesity Estimated  body mass index is 29.87 kg/m as calculated from the following:   Height as of this encounter: 6' (1.829 m).   Weight as of this encounter: 99.9 kg.   Pressure injury buttocks stage  II Agree with assessment below Pressure Injury 08/20/22 Buttocks Medial Stage 2 -  Partial thickness loss of dermis presenting as a shallow open injury with a red, pink wound bed without slough. (Active)  08/20/22 0800  Location: Buttocks  Location Orientation: Medial  Staging: Stage 2 -  Partial thickness loss of dermis presenting as a shallow open injury with a red, pink wound bed without slough.  Wound Description (Comments):   Present on Admission: No   Patient is stable.  Okay for discharge to SNF.   PERTINENT LABS:  The results of significant diagnostics from this hospitalization (including imaging, microbiology, ancillary and laboratory) are listed below for reference.     Labs:   Basic Metabolic Panel: Recent Labs  Lab 08/25/22 0547 08/27/22 0320 08/28/22 0250  NA 136 135 135  K 4.4 4.6 4.8  CL 106 102 102  CO2 24 24 24   GLUCOSE 150* 154* 177*  BUN 15 13 21   CREATININE 0.60* 0.62 0.68  CALCIUM 8.8* 8.5* 8.7*  MG 1.9 1.9  --   PHOS 3.4  --   --    Liver Function Tests: Recent Labs  Lab 08/25/22 0547 08/27/22 0320  AST 64* 61*  ALT 84* 102*  ALKPHOS 236* 225*  BILITOT 0.4 0.6  PROT 6.6 6.7  ALBUMIN 2.4* 2.4*    CBC: Recent Labs  Lab 08/25/22 0547 08/27/22 0320  WBC 10.8* 10.0  HGB 9.6* 10.2*  HCT 30.2* 31.4*  MCV 95.0 92.4  PLT 623* 671*     CBG: Recent Labs  Lab 08/27/22 2111 08/28/22 1126 08/28/22 1637 08/28/22 2028 08/29/22 0623  GLUCAP 234* 267* 306* 206* 173*     IMAGING STUDIES DG Swallowing Func-Speech Pathology  Result Date: 08/22/2022 Table formatting from the original result was not included. Objective Swallowing Evaluation: Type of Study: MBS-Modified Barium Swallow Study  Patient Details Name: Charles Fox MRN: 14/12/2021 Date of Birth: 1950/11/06 Today's Date: 08/22/2022 Time: SLP Start Time (ACUTE ONLY): 1508 -SLP Stop Time (ACUTE ONLY): 1532 SLP Time Calculation (min) (ACUTE ONLY): 24 min Past Medical History: No  past medical history on file. Past Surgical History: No past surgical history on file. HPI: Pt is a 71 yo M admittted 11/20 after being found down outside of his group home. Pt found to have mild nasal fracture MRI brain 11/20 negative for acute changes. Pt seen by SLP 11/21 and 11/22; a dysphagia 2 diet with thin liquids initiated on 11/22. Pt developed progressively worsening lethargy, increasing hypotension, rising WBC, near obtundation, and SOB and was transferred to the ICU 11/23; ETT 11/23-11/30. Pt found to have sepsis, UTI with septic shock, and PNA. PMH: dementia, T2DM, Htn, HLD, CVA, housing insecurity, and tobacco use disorder. BSE 05/09/21: regular texture diet and thin liquids recommended without need for follow up.  Subjective: pt in bed, asleep - woke and indicated he wanted food/drink  Recommendations for follow up therapy are one component of a multi-disciplinary discharge planning process, led by the attending physician.  Recommendations may be updated based on patient status, additional functional criteria and insurance authorization. Assessment / Plan / Recommendation   08/22/2022   4:12 PM Clinical Impressions Clinical Impression Pt presents with oropharyngeal dysphagia characterized by reduced posterior propulsion, weak lingual manipulation, reduced bolus cohesion and  [facility]  Physician Discharge Summary   Patient ID: Charles Fox MRN: [id number] DOB/AGE: 1951-08-13 71 y.o.  Admit date: 08/08/2022 Discharge date:   08/29/2022   PCP: Pcp, No  DISCHARGE DIAGNOSES:    Controlled diabetes mellitus type 2 with complications (HCC)   AKI (acute kidney injury) (HCC)   Essential hypertension   Encephalopathy acute   Septic shock due to Escherichia coli (HCC)   E coli bacteremia   Sepsis due to Escherichia coli with acute renal failure and septic shock (HCC)   RECOMMENDATIONS FOR OUTPATIENT FOLLOW UP: Please refer to urology for voiding trial after a few days of rehab CBC and basic metabolic panel on a weekly basis Monitor CBGs.  Sliding scale coverage per SNF protocol. Speech therapy to follow at skilled nursing facility   Home Health: SNF Equipment/Devices: None  CODE STATUS: Full code  DISCHARGE CONDITION: fair  Diet recommendation: Dysphagia 1 diet with nectar thick liquids  INITIAL HISTORY: 71 year old man hx of DM2, HTN, HLD, CVA, dementia resides in SNF, ward of the state, presented with AMS, questionable fall and seizure. Nasal fx on imaging. Neuro workup neg (MRI/EEG), question raised of progressive dementia or hypertensive encephalopathy.    Hospital Course:  08/08/22 admit 11/22 worsening mental status 11/23 ICU consult, intubated, started on pressors for profound shock with lactic acidosis. 11/24 Pressor requirements improved 11/29 weaning on vent but variable mentaiton 11/30 extubated 12/2 transfer to [facility]    HOSPITAL COURSE:   Acute metabolic encephalopathy, superimposed on dementia Workup including MRI has been negative. Likely due to septic shock, icu delirium. Delirium precautions ongoing. He does have underlying dementia. Mentation gradually improving.  Probably close to baseline now.   HCAP, aspiration PNA Patient has completed course of antibiotics, Ancef.   Aspiration  precautions. Respiratory status is stable for the most part.  Saturating normal on room air.   Oropharyngeal dysphagia Currently on dysphagia 1 diet.   Speech therapy has been following.  Patient initially required NG feeding tube.  Oral intake gradually improved.  Feeding tube was removed on 12/8.   Seems to be tolerating his diet well.  Continue to encourage oral intake. Speech therapy to follow at skilled nursing facility   Septic shock w multisystem organ failure (resp failure, liver dysfunction, AKI, improved, encephalopathy) Due to ecoli bacteremia, HCAP, UTI - shock resolved   Urinary retention  -[place] w no hydronephrosis -Foley out 12/1 -difficulty voiding over the weekend, Foley replaced on 12/3 due to obstruction and ongoing urinary retention.   Will need referral to urology after his mobility has improved for voiding trial.  Continue Flomax and finasteride.   Frequent bowel movements Abdomen is benign.  Does not have any fever.  Do not suspect infectious etiology.  It is not watery.  Laxatives have been discontinued.  Continue probiotics.   Transaminitis  Likely secondary to septic shock LFTs have improved but still remain elevated. Hepatitis panel is negative He is noted to be on statin which could also be contributing to mildly elevated LFTs.  Would recommend that this be followed in the outpatient setting every so often.   DM2, Insulin dependent, uncontrolled with hyperglycemia HbA1c 6.6.  Continue with Lantus at a slightly lower dose.  Monitor CBGs.     Essential hypertension/hyperlipidemia  Continue irbesartan and carvedilol. ASA, statin ongoing   Anemia, acute secondary to critical illness  No evidence of overt bleeding.    Goals of care -Ward of state    Obesity Estimated  Kidney: Renal measurements: 1318 x 5.4 x 5 0.8 cm = volume: 227 mL. Echogenicity within normal limits. No mass or hydronephrosis visualized. Minimal fullness noted intrarenal collecting system Left Kidney: Renal measurements: 12.8 x 6.0 x 6.1 cm = volume:  242 mL. Echogenicity within normal limits. No mass or hydronephrosis visualized. Bladder: Decompressed by Foley catheter. Other: None. IMPRESSION: No hydronephrosis. No findings to explain the history of acute kidney injury. Electronically Signed   By: Kennith Center M.D.   On: 08/11/2022 10:32   DG Chest Port 1 View  Result Date: 08/11/2022 CLINICAL DATA:  Evaluate ET tube placement EXAM: PORTABLE CHEST 1 VIEW COMPARISON:  08/10/2022 FINDINGS: There is a right IJ catheter with tip in the right atrium. ETT tip is 1.3 cm above the carina. Enteric tube is identified with tip and side port below the GE junction. Stable cardiac enlargement. Mild pulmonary vascular congestion. Worsening aeration to the left base may represent progressive atelectasis or airspace disease. Small left pleural effusion not excluded. IMPRESSION: 1. ETT tip is 1.3 cm above the carina. 2. Worsening aeration to the left base may represent progressive atelectasis or airspace disease. Small left pleural effusion not excluded. Electronically Signed   By: Signa Kell M.D.   On: 08/11/2022 06:26   DG CHEST PORT 1 VIEW  Result Date: 08/10/2022 CLINICAL DATA:  Dyspnea. EXAM: PORTABLE CHEST 1 VIEW COMPARISON:  08/08/2022. FINDINGS: Heart is enlarged and the mediastinal contour is within normal limits. There is atherosclerotic calcification of the aorta. Lung volumes are low. No consolidation, effusion, or pneumothorax. No acute osseous abnormality. IMPRESSION: Low lung volumes with no acute process. Electronically Signed   By: Thornell Sartorius M.D.   On: 08/10/2022 22:14   ECHOCARDIOGRAM COMPLETE  Result Date: 08/09/2022    ECHOCARDIOGRAM REPORT   Patient Name:   Charles Fox Date of Exam: 08/09/2022 Medical Rec #:  [id number]       Height:       72.0 in Accession #:    [id number]      Weight:       226.6 lb Date of Birth:  Jun 07, 1951      BSA:          2.247 m Patient Age:    38 years        BP:           191/93 mmHg Patient Gender: M                HR:           99 bpm. Exam Location:  Inpatient Procedure: 2D Echo and Intracardiac Opacification Agent Indications:    stroke  History:        Patient has no prior history of Echocardiogram examinations.                 Signs/Symptoms:Syncope.  Sonographer:    Cathie Hoops Referring Phys: [id number] [facility]  Sonographer Comments: Technically difficult study due to poor echo windows. Image acquisition challenging due to uncooperative patient. IMPRESSIONS  1. Left ventricular ejection fraction, by estimation, is 60 to 65%. The left ventricle has normal function. The left ventricle has no regional wall motion abnormalities. There is mild concentric left ventricular hypertrophy. Left ventricular diastolic parameters are consistent with Grade I diastolic dysfunction (impaired relaxation).  2. Right ventricular systolic function is normal. The right ventricular size is normal. Mildly increased right ventricular wall thickness. Tricuspid regurgitation signal is inadequate for assessing PA pressure.  3. A  Kidney: Renal measurements: 1318 x 5.4 x 5 0.8 cm = volume: 227 mL. Echogenicity within normal limits. No mass or hydronephrosis visualized. Minimal fullness noted intrarenal collecting system Left Kidney: Renal measurements: 12.8 x 6.0 x 6.1 cm = volume:  242 mL. Echogenicity within normal limits. No mass or hydronephrosis visualized. Bladder: Decompressed by Foley catheter. Other: None. IMPRESSION: No hydronephrosis. No findings to explain the history of acute kidney injury. Electronically Signed   By: Kennith Center M.D.   On: 08/11/2022 10:32   DG Chest Port 1 View  Result Date: 08/11/2022 CLINICAL DATA:  Evaluate ET tube placement EXAM: PORTABLE CHEST 1 VIEW COMPARISON:  08/10/2022 FINDINGS: There is a right IJ catheter with tip in the right atrium. ETT tip is 1.3 cm above the carina. Enteric tube is identified with tip and side port below the GE junction. Stable cardiac enlargement. Mild pulmonary vascular congestion. Worsening aeration to the left base may represent progressive atelectasis or airspace disease. Small left pleural effusion not excluded. IMPRESSION: 1. ETT tip is 1.3 cm above the carina. 2. Worsening aeration to the left base may represent progressive atelectasis or airspace disease. Small left pleural effusion not excluded. Electronically Signed   By: Signa Kell M.D.   On: 08/11/2022 06:26   DG CHEST PORT 1 VIEW  Result Date: 08/10/2022 CLINICAL DATA:  Dyspnea. EXAM: PORTABLE CHEST 1 VIEW COMPARISON:  08/08/2022. FINDINGS: Heart is enlarged and the mediastinal contour is within normal limits. There is atherosclerotic calcification of the aorta. Lung volumes are low. No consolidation, effusion, or pneumothorax. No acute osseous abnormality. IMPRESSION: Low lung volumes with no acute process. Electronically Signed   By: Thornell Sartorius M.D.   On: 08/10/2022 22:14   ECHOCARDIOGRAM COMPLETE  Result Date: 08/09/2022    ECHOCARDIOGRAM REPORT   Patient Name:   Charles Fox Date of Exam: 08/09/2022 Medical Rec #:  [id number]       Height:       72.0 in Accession #:    [id number]      Weight:       226.6 lb Date of Birth:  Jun 07, 1951      BSA:          2.247 m Patient Age:    38 years        BP:           191/93 mmHg Patient Gender: M                HR:           99 bpm. Exam Location:  Inpatient Procedure: 2D Echo and Intracardiac Opacification Agent Indications:    stroke  History:        Patient has no prior history of Echocardiogram examinations.                 Signs/Symptoms:Syncope.  Sonographer:    Cathie Hoops Referring Phys: [id number] [facility]  Sonographer Comments: Technically difficult study due to poor echo windows. Image acquisition challenging due to uncooperative patient. IMPRESSIONS  1. Left ventricular ejection fraction, by estimation, is 60 to 65%. The left ventricle has normal function. The left ventricle has no regional wall motion abnormalities. There is mild concentric left ventricular hypertrophy. Left ventricular diastolic parameters are consistent with Grade I diastolic dysfunction (impaired relaxation).  2. Right ventricular systolic function is normal. The right ventricular size is normal. Mildly increased right ventricular wall thickness. Tricuspid regurgitation signal is inadequate for assessing PA pressure.  3. A  [facility]  Physician Discharge Summary   Patient ID: Charles Fox MRN: [id number] DOB/AGE: 1951-08-13 71 y.o.  Admit date: 08/08/2022 Discharge date:   08/29/2022   PCP: Pcp, No  DISCHARGE DIAGNOSES:    Controlled diabetes mellitus type 2 with complications (HCC)   AKI (acute kidney injury) (HCC)   Essential hypertension   Encephalopathy acute   Septic shock due to Escherichia coli (HCC)   E coli bacteremia   Sepsis due to Escherichia coli with acute renal failure and septic shock (HCC)   RECOMMENDATIONS FOR OUTPATIENT FOLLOW UP: Please refer to urology for voiding trial after a few days of rehab CBC and basic metabolic panel on a weekly basis Monitor CBGs.  Sliding scale coverage per SNF protocol. Speech therapy to follow at skilled nursing facility   Home Health: SNF Equipment/Devices: None  CODE STATUS: Full code  DISCHARGE CONDITION: fair  Diet recommendation: Dysphagia 1 diet with nectar thick liquids  INITIAL HISTORY: 71 year old man hx of DM2, HTN, HLD, CVA, dementia resides in SNF, ward of the state, presented with AMS, questionable fall and seizure. Nasal fx on imaging. Neuro workup neg (MRI/EEG), question raised of progressive dementia or hypertensive encephalopathy.    Hospital Course:  08/08/22 admit 11/22 worsening mental status 11/23 ICU consult, intubated, started on pressors for profound shock with lactic acidosis. 11/24 Pressor requirements improved 11/29 weaning on vent but variable mentaiton 11/30 extubated 12/2 transfer to [facility]    HOSPITAL COURSE:   Acute metabolic encephalopathy, superimposed on dementia Workup including MRI has been negative. Likely due to septic shock, icu delirium. Delirium precautions ongoing. He does have underlying dementia. Mentation gradually improving.  Probably close to baseline now.   HCAP, aspiration PNA Patient has completed course of antibiotics, Ancef.   Aspiration  precautions. Respiratory status is stable for the most part.  Saturating normal on room air.   Oropharyngeal dysphagia Currently on dysphagia 1 diet.   Speech therapy has been following.  Patient initially required NG feeding tube.  Oral intake gradually improved.  Feeding tube was removed on 12/8.   Seems to be tolerating his diet well.  Continue to encourage oral intake. Speech therapy to follow at skilled nursing facility   Septic shock w multisystem organ failure (resp failure, liver dysfunction, AKI, improved, encephalopathy) Due to ecoli bacteremia, HCAP, UTI - shock resolved   Urinary retention  -[place] w no hydronephrosis -Foley out 12/1 -difficulty voiding over the weekend, Foley replaced on 12/3 due to obstruction and ongoing urinary retention.   Will need referral to urology after his mobility has improved for voiding trial.  Continue Flomax and finasteride.   Frequent bowel movements Abdomen is benign.  Does not have any fever.  Do not suspect infectious etiology.  It is not watery.  Laxatives have been discontinued.  Continue probiotics.   Transaminitis  Likely secondary to septic shock LFTs have improved but still remain elevated. Hepatitis panel is negative He is noted to be on statin which could also be contributing to mildly elevated LFTs.  Would recommend that this be followed in the outpatient setting every so often.   DM2, Insulin dependent, uncontrolled with hyperglycemia HbA1c 6.6.  Continue with Lantus at a slightly lower dose.  Monitor CBGs.     Essential hypertension/hyperlipidemia  Continue irbesartan and carvedilol. ASA, statin ongoing   Anemia, acute secondary to critical illness  No evidence of overt bleeding.    Goals of care -Ward of state    Obesity Estimated  Kidney: Renal measurements: 1318 x 5.4 x 5 0.8 cm = volume: 227 mL. Echogenicity within normal limits. No mass or hydronephrosis visualized. Minimal fullness noted intrarenal collecting system Left Kidney: Renal measurements: 12.8 x 6.0 x 6.1 cm = volume:  242 mL. Echogenicity within normal limits. No mass or hydronephrosis visualized. Bladder: Decompressed by Foley catheter. Other: None. IMPRESSION: No hydronephrosis. No findings to explain the history of acute kidney injury. Electronically Signed   By: Kennith Center M.D.   On: 08/11/2022 10:32   DG Chest Port 1 View  Result Date: 08/11/2022 CLINICAL DATA:  Evaluate ET tube placement EXAM: PORTABLE CHEST 1 VIEW COMPARISON:  08/10/2022 FINDINGS: There is a right IJ catheter with tip in the right atrium. ETT tip is 1.3 cm above the carina. Enteric tube is identified with tip and side port below the GE junction. Stable cardiac enlargement. Mild pulmonary vascular congestion. Worsening aeration to the left base may represent progressive atelectasis or airspace disease. Small left pleural effusion not excluded. IMPRESSION: 1. ETT tip is 1.3 cm above the carina. 2. Worsening aeration to the left base may represent progressive atelectasis or airspace disease. Small left pleural effusion not excluded. Electronically Signed   By: Signa Kell M.D.   On: 08/11/2022 06:26   DG CHEST PORT 1 VIEW  Result Date: 08/10/2022 CLINICAL DATA:  Dyspnea. EXAM: PORTABLE CHEST 1 VIEW COMPARISON:  08/08/2022. FINDINGS: Heart is enlarged and the mediastinal contour is within normal limits. There is atherosclerotic calcification of the aorta. Lung volumes are low. No consolidation, effusion, or pneumothorax. No acute osseous abnormality. IMPRESSION: Low lung volumes with no acute process. Electronically Signed   By: Thornell Sartorius M.D.   On: 08/10/2022 22:14   ECHOCARDIOGRAM COMPLETE  Result Date: 08/09/2022    ECHOCARDIOGRAM REPORT   Patient Name:   Charles Fox Date of Exam: 08/09/2022 Medical Rec #:  [id number]       Height:       72.0 in Accession #:    [id number]      Weight:       226.6 lb Date of Birth:  Jun 07, 1951      BSA:          2.247 m Patient Age:    38 years        BP:           191/93 mmHg Patient Gender: M                HR:           99 bpm. Exam Location:  Inpatient Procedure: 2D Echo and Intracardiac Opacification Agent Indications:    stroke  History:        Patient has no prior history of Echocardiogram examinations.                 Signs/Symptoms:Syncope.  Sonographer:    Cathie Hoops Referring Phys: [id number] [facility]  Sonographer Comments: Technically difficult study due to poor echo windows. Image acquisition challenging due to uncooperative patient. IMPRESSIONS  1. Left ventricular ejection fraction, by estimation, is 60 to 65%. The left ventricle has normal function. The left ventricle has no regional wall motion abnormalities. There is mild concentric left ventricular hypertrophy. Left ventricular diastolic parameters are consistent with Grade I diastolic dysfunction (impaired relaxation).  2. Right ventricular systolic function is normal. The right ventricular size is normal. Mildly increased right ventricular wall thickness. Tricuspid regurgitation signal is inadequate for assessing PA pressure.  3. A  [facility]  Physician Discharge Summary   Patient ID: Charles Fox MRN: [id number] DOB/AGE: 1951-08-13 71 y.o.  Admit date: 08/08/2022 Discharge date:   08/29/2022   PCP: Pcp, No  DISCHARGE DIAGNOSES:    Controlled diabetes mellitus type 2 with complications (HCC)   AKI (acute kidney injury) (HCC)   Essential hypertension   Encephalopathy acute   Septic shock due to Escherichia coli (HCC)   E coli bacteremia   Sepsis due to Escherichia coli with acute renal failure and septic shock (HCC)   RECOMMENDATIONS FOR OUTPATIENT FOLLOW UP: Please refer to urology for voiding trial after a few days of rehab CBC and basic metabolic panel on a weekly basis Monitor CBGs.  Sliding scale coverage per SNF protocol. Speech therapy to follow at skilled nursing facility   Home Health: SNF Equipment/Devices: None  CODE STATUS: Full code  DISCHARGE CONDITION: fair  Diet recommendation: Dysphagia 1 diet with nectar thick liquids  INITIAL HISTORY: 71 year old man hx of DM2, HTN, HLD, CVA, dementia resides in SNF, ward of the state, presented with AMS, questionable fall and seizure. Nasal fx on imaging. Neuro workup neg (MRI/EEG), question raised of progressive dementia or hypertensive encephalopathy.    Hospital Course:  08/08/22 admit 11/22 worsening mental status 11/23 ICU consult, intubated, started on pressors for profound shock with lactic acidosis. 11/24 Pressor requirements improved 11/29 weaning on vent but variable mentaiton 11/30 extubated 12/2 transfer to [facility]    HOSPITAL COURSE:   Acute metabolic encephalopathy, superimposed on dementia Workup including MRI has been negative. Likely due to septic shock, icu delirium. Delirium precautions ongoing. He does have underlying dementia. Mentation gradually improving.  Probably close to baseline now.   HCAP, aspiration PNA Patient has completed course of antibiotics, Ancef.   Aspiration  precautions. Respiratory status is stable for the most part.  Saturating normal on room air.   Oropharyngeal dysphagia Currently on dysphagia 1 diet.   Speech therapy has been following.  Patient initially required NG feeding tube.  Oral intake gradually improved.  Feeding tube was removed on 12/8.   Seems to be tolerating his diet well.  Continue to encourage oral intake. Speech therapy to follow at skilled nursing facility   Septic shock w multisystem organ failure (resp failure, liver dysfunction, AKI, improved, encephalopathy) Due to ecoli bacteremia, HCAP, UTI - shock resolved   Urinary retention  -[place] w no hydronephrosis -Foley out 12/1 -difficulty voiding over the weekend, Foley replaced on 12/3 due to obstruction and ongoing urinary retention.   Will need referral to urology after his mobility has improved for voiding trial.  Continue Flomax and finasteride.   Frequent bowel movements Abdomen is benign.  Does not have any fever.  Do not suspect infectious etiology.  It is not watery.  Laxatives have been discontinued.  Continue probiotics.   Transaminitis  Likely secondary to septic shock LFTs have improved but still remain elevated. Hepatitis panel is negative He is noted to be on statin which could also be contributing to mildly elevated LFTs.  Would recommend that this be followed in the outpatient setting every so often.   DM2, Insulin dependent, uncontrolled with hyperglycemia HbA1c 6.6.  Continue with Lantus at a slightly lower dose.  Monitor CBGs.     Essential hypertension/hyperlipidemia  Continue irbesartan and carvedilol. ASA, statin ongoing   Anemia, acute secondary to critical illness  No evidence of overt bleeding.    Goals of care -Ward of state    Obesity Estimated  body mass index is 29.87 kg/m as calculated from the following:   Height as of this encounter: 6' (1.829 m).   Weight as of this encounter: 99.9 kg.   Pressure injury buttocks stage  II Agree with assessment below Pressure Injury 08/20/22 Buttocks Medial Stage 2 -  Partial thickness loss of dermis presenting as a shallow open injury with a red, pink wound bed without slough. (Active)  08/20/22 0800  Location: Buttocks  Location Orientation: Medial  Staging: Stage 2 -  Partial thickness loss of dermis presenting as a shallow open injury with a red, pink wound bed without slough.  Wound Description (Comments):   Present on Admission: No   Patient is stable.  Okay for discharge to SNF.   PERTINENT LABS:  The results of significant diagnostics from this hospitalization (including imaging, microbiology, ancillary and laboratory) are listed below for reference.     Labs:   Basic Metabolic Panel: Recent Labs  Lab 08/25/22 0547 08/27/22 0320 08/28/22 0250  NA 136 135 135  K 4.4 4.6 4.8  CL 106 102 102  CO2 24 24 24   GLUCOSE 150* 154* 177*  BUN 15 13 21   CREATININE 0.60* 0.62 0.68  CALCIUM 8.8* 8.5* 8.7*  MG 1.9 1.9  --   PHOS 3.4  --   --    Liver Function Tests: Recent Labs  Lab 08/25/22 0547 08/27/22 0320  AST 64* 61*  ALT 84* 102*  ALKPHOS 236* 225*  BILITOT 0.4 0.6  PROT 6.6 6.7  ALBUMIN 2.4* 2.4*    CBC: Recent Labs  Lab 08/25/22 0547 08/27/22 0320  WBC 10.8* 10.0  HGB 9.6* 10.2*  HCT 30.2* 31.4*  MCV 95.0 92.4  PLT 623* 671*     CBG: Recent Labs  Lab 08/27/22 2111 08/28/22 1126 08/28/22 1637 08/28/22 2028 08/29/22 0623  GLUCAP 234* 267* 306* 206* 173*     IMAGING STUDIES DG Swallowing Func-Speech Pathology  Result Date: 08/22/2022 Table formatting from the original result was not included. Objective Swallowing Evaluation: Type of Study: MBS-Modified Barium Swallow Study  Patient Details Name: Charles Fox MRN: [id number] Date of Birth: 1950/11/06 Today's Date: 08/22/2022 Time: SLP Start Time (ACUTE ONLY): 1508 -SLP Stop Time (ACUTE ONLY): 1532 SLP Time Calculation (min) (ACUTE ONLY): 24 min Past Medical History: No  past medical history on file. Past Surgical History: No past surgical history on file. HPI: Pt is a 71 yo M admittted 11/20 after being found down outside of his group home. Pt found to have mild nasal fracture MRI brain 11/20 negative for acute changes. Pt seen by SLP 11/21 and 11/22; a dysphagia 2 diet with thin liquids initiated on 11/22. Pt developed progressively worsening lethargy, increasing hypotension, rising WBC, near obtundation, and SOB and was transferred to the ICU 11/23; ETT 11/23-11/30. Pt found to have sepsis, UTI with septic shock, and PNA. PMH: dementia, T2DM, Htn, HLD, CVA, housing insecurity, and tobacco use disorder. BSE 05/09/21: regular texture diet and thin liquids recommended without need for follow up.  Subjective: pt in bed, asleep - woke and indicated he wanted food/drink  Recommendations for follow up therapy are one component of a multi-disciplinary discharge planning process, led by the attending physician.  Recommendations may be updated based on patient status, additional functional criteria and insurance authorization. Assessment / Plan / Recommendation   08/22/2022   4:12 PM Clinical Impressions Clinical Impression Pt presents with oropharyngeal dysphagia characterized by reduced posterior propulsion, weak lingual manipulation, reduced bolus cohesion and

## 2022-08-30 ENCOUNTER — Encounter (HOSPITAL_COMMUNITY): Payer: Self-pay | Admitting: Family Medicine

## 2022-09-05 ENCOUNTER — Encounter (HOSPITAL_COMMUNITY): Payer: Self-pay

## 2022-09-05 ENCOUNTER — Emergency Department (HOSPITAL_COMMUNITY)
Admission: EM | Admit: 2022-09-05 | Discharge: 2022-09-06 | Disposition: A | Discharge status: Home / Self Care | Payer: Self-pay | Attending: Emergency Medicine | Admitting: Emergency Medicine

## 2022-09-05 DIAGNOSIS — Y732 Prosthetic and other implants, materials and accessory gastroenterology and urology devices associated with adverse incidents: Secondary | ICD-10-CM | POA: Insufficient documentation

## 2022-09-05 DIAGNOSIS — Z7982 Long term (current) use of aspirin: Secondary | ICD-10-CM | POA: Insufficient documentation

## 2022-09-05 DIAGNOSIS — Z794 Long term (current) use of insulin: Secondary | ICD-10-CM | POA: Insufficient documentation

## 2022-09-05 DIAGNOSIS — T859XXA Unspecified complication of internal prosthetic device, implant and graft, initial encounter: Secondary | ICD-10-CM

## 2022-09-05 DIAGNOSIS — F0390 Unspecified dementia without behavioral disturbance: Secondary | ICD-10-CM | POA: Insufficient documentation

## 2022-09-05 DIAGNOSIS — T83028A Displacement of other indwelling urethral catheter, initial encounter: Secondary | ICD-10-CM | POA: Insufficient documentation

## 2022-09-05 MED ORDER — LIDOCAINE HCL URETHRAL/MUCOSAL 2 % EX GEL
1.0000 | Freq: Once | CUTANEOUS | Status: AC
Start: 2022-09-05 — End: 2022-09-05
  Administered 2022-09-05: 1 via URETHRAL
  Filled 2022-09-05: qty 11

## 2022-09-05 NOTE — ED Triage Notes (Signed)
Pt arrived via EMS, from [facility]. Pulled out catheter earlier today. Bleeding since.

## 2022-09-05 NOTE — ED Provider Notes (Signed)
I received this patient handoff from previous PA, Arthor Captain.  Please see her note for original history and workup thus far.  In short patient is a 71 year old pleasantly demented male presenting today after pulling out his Foley catheter.  Emergency staff was unable to replace his catheter and thus urology will come and do so.  Plan is for me to wait for Dr. Liliane Shi  Physical Exam  BP (!) 156/97   Pulse 99   Temp 98.8 F (37.1 C) (Oral)   Resp 18   SpO2 97%   Physical Exam  Procedures  Procedures  ED Course / MDM    Medical Decision Making  Dr. Liliane Shi with urology was able to replace a Foley catheter.  His recommendation is to keep the Foley catheter in place for 3 to 5 days and then attempt a morning time voiding trial.  This was communicated to patient's legal guardian, Redmond Pulling.  It was also written on his discharge papers.  Patient is stable for discharge at this time.       Saddie Benders, PA-C 09/06/22 0038    Gloris Manchester, MD 09/06/22 [phone]

## 2022-09-05 NOTE — ED Provider Notes (Signed)
[facility] DEPT Provider Note   CSN: [id number] Arrival date & time: 09/05/22  1837     History {Add pertinent medical, surgical, social history, OB history to HPI:1} No chief complaint on file.   Charles Fox is a 71 y.o. male who presents for evaluation of catheter related issue. He has a history of dementia.  He apparently pulled his catheter out.  He has had bleeding In his penis since that time.  Patient is unable to give any history.  Of note he has a large abscess on his chin.  HPI     Home Medications Prior to Admission medications   Medication Sig Start Date End Date Taking? Authorizing Provider  aspirin EC 81 MG tablet Take 1 tablet (81 mg total) by mouth daily. Swallow whole. 10/14/20   [name], MD  aspirin EC 81 MG tablet Take 81 mg by mouth daily. Swallow whole.    [provider]  atorvastatin (LIPITOR) 40 MG tablet Take 1 tablet (40 mg total) by mouth daily. 10/14/20   [name], MD  atorvastatin (LIPITOR) 40 MG tablet Take 40 mg by mouth daily.    [provider]  carvedilol (COREG) 3.125 MG tablet Take 1 tablet (3.125 mg total) by mouth 2 (two) times daily with a meal. 09/24/21   Autry-Lott, Naaman Plummer, DO  carvedilol (COREG) 6.25 MG tablet Take 6.25 mg by mouth 2 (two) times daily with a meal.    [provider]  citalopram (CELEXA) 20 MG tablet Take 1 tablet (20 mg total) by mouth daily. 09/24/21   Autry-Lott, Naaman Plummer, DO  citalopram (CELEXA) 40 MG tablet Take 40 mg by mouth daily.    [provider]  finasteride (PROSCAR) 5 MG tablet Take 1 tablet (5 mg total) by mouth daily. 09/24/21   Autry-Lott, Naaman Plummer, DO  finasteride (PROSCAR) 5 MG tablet Take 5 mg by mouth daily.    [provider]  insulin glargine (LANTUS) 100 UNIT/ML Solostar Pen Inject 12 Units into the skin daily. 08/29/22   Bonnielee Haff, MD  ipratropium-albuterol (DUONEB) 0.5-2.5 (3) MG/3ML SOLN Take 3 mLs by  nebulization every 4 (four) hours as needed. 08/29/22   Bonnielee Haff, MD  irbesartan (AVAPRO) 150 MG tablet Take 150 mg by mouth daily.    [provider]  lidocaine (LIDODERM) 5 % Place 1 patch onto the skin daily. Remove & Discard patch within 12 hours or as directed by MD 09/24/21   Autry-Lott, Naaman Plummer, DO  losartan (COZAAR) 50 MG tablet Take 1.5 tablets (75 mg total) by mouth at bedtime. 09/24/21   Autry-Lott, Naaman Plummer, DO  melatonin 5 MG TABS Take 1 tablet (5 mg total) by mouth at bedtime. 09/24/21   Autry-Lott, Naaman Plummer, DO  melatonin 5 MG TABS Take 5 mg by mouth at bedtime.    [provider]  metFORMIN (GLUCOPHAGE-XR) 500 MG 24 hr tablet Take 1 tablet (500 mg total) by mouth 2 (two) times daily with a meal. 05/05/21   [name], MD  metFORMIN (GLUCOPHAGE-XR) 500 MG 24 hr tablet Take 1,000 mg by mouth 2 (two) times daily.    [provider]  nitroGLYCERIN (NITROSTAT) 0.4 MG SL tablet Place 1 tablet (0.4 mg total) under the tongue every 5 (five) minutes as needed for chest pain. 09/24/21   Autry-Lott, Naaman Plummer, DO  nitroGLYCERIN (NITROSTAT) 0.4 MG SL tablet Place 0.4 mg under the tongue every 5 (five) minutes as needed for chest pain.    [provider]  Nutritional Supplements (FEEDING SUPPLEMENT, NEPRO CARB STEADY,) LIQD Take 237 mLs by mouth 2 (two) times daily between meals. 08/29/22   Bonnielee Haff, MD  olopatadine (PATANOL) 0.1 % ophthalmic solution Place 1 drop into both eyes 2 (two) times daily as needed (dry eyes). 09/24/21   Autry-Lott, Naaman Plummer, DO  olopatadine (PATANOL) 0.1 % ophthalmic solution Place 1 drop into both eyes 2 (two) times daily as needed for allergies (dry eyes).    [provider]  QUEtiapine (SEROQUEL) 25 MG tablet Take 3 tablets (75 mg total) by mouth at bedtime. 09/24/21   Autry-Lott, Naaman Plummer, DO  QUEtiapine (SEROQUEL) 25 MG tablet Take 75 mg by mouth at bedtime.    [provider]  saccharomyces boulardii (FLORASTOR) 250 MG  capsule Take 1 capsule (250 mg total) by mouth 2 (two) times daily. 08/29/22   Bonnielee Haff, MD  senna (SENOKOT) 8.6 MG TABS tablet Take 1 tablet (8.6 mg total) by mouth daily. 09/24/21   Autry-Lott, Naaman Plummer, DO  tamsulosin (FLOMAX) 0.4 MG CAPS capsule Take 2 capsules (0.8 mg total) by mouth daily. 09/24/21   Autry-Lott, Naaman Plummer, DO  tamsulosin (FLOMAX) 0.4 MG CAPS capsule Take 0.8 mg by mouth daily after breakfast.    [provider]      Allergies    Patient has no known allergies.    Review of Systems   Review of Systems  Physical Exam Updated Vital Signs BP 137/80 (BP Location: Left Arm)   Pulse (!) 116   Temp 98.8 F (37.1 C) (Oral)   Resp 18   SpO2 98%  Physical Exam  ED Results / Procedures / Treatments   Labs (all labs ordered are listed, but only abnormal results are displayed) Labs Reviewed - No data to display  EKG None  Radiology No results found.  Procedures Procedures  {Document cardiac monitor, telemetry assessment procedure when appropriate:1}  Medications Ordered in ED Medications - No data to display  ED Course/ Medical Decision Making/ A&P                           Medical Decision Making  ***  {Document critical care time when appropriate:1} {Document review of labs and clinical decision tools ie heart score, Chads2Vasc2 etc:1}  {Document your independent review of radiology images, and any outside records:1} {Document your discussion with family members, caretakers, and with consultants:1} {Document social determinants of health affecting pt's care:1} {Document your decision making why or why not admission, treatments were needed:1} Final Clinical Impression(s) / ED Diagnoses Final diagnoses:  None    Rx / DC Orders ED Discharge Orders     None

## 2022-09-06 NOTE — ED Notes (Signed)
[facility] called for pt. Transportation back to [facility]

## 2022-09-06 NOTE — ED Notes (Signed)
Report given to [place] at [facility].

## 2022-09-06 NOTE — Consult Note (Signed)
Urology Consult   Physician requesting consult: Milton Ferguson, MD   Reason for consult: Hematuria urinary retention   History of Present Illness: Charles Fox is a 71 y.o.  male who was recently admitted due to E. coli bacteremia thought to be secondary to urinary tract infection and HCAP.  During his admission, the patient was catheterized due to urinary retention and subsequently sent to a skilled nursing facility on 08/29/2022.  Today, the patient forcefully removed his Foley catheter resulting in gross hematuria.  Despite multiple attempts by the ER staff, another Foley catheter could not be replaced and urology was consulted for an evaluation.  The patient is unsure why he has his Foley catheter in and states that prior to his admission in November, he was urinating without difficulty.   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    Scheduled Meds: Continuous Infusions: PRN Meds:.  Allergies: No Known Allergies  History reviewed. No pertinent family history.  Social History:  reports that he has quit smoking. His smoking use included cigarettes. He has never used smokeless tobacco. He reports that he does not currently use drugs. No history on file for alcohol use.  ROS: A complete review of systems was performed.  All systems are negative except  for pertinent findings as noted.  Physical Exam:  Vital signs in last 24 hours: Temp:  [97.7 F (36.5 C)-98.8 F (37.1 C)] 97.7 F (36.5 C) (12/18 2312) Pulse Rate:  [96-116] 96 (12/18 2345) Resp:  [18] 18 (12/18 2345) BP: (128-156)/(75-97) 156/76 (12/18 2345) SpO2:  [96 %-99 %] 97 % (12/18 2345) Constitutional:  No acute distress.  Disoriented to person place and time GU: Penis is circumcised with blood at the urethral meatus.  Both testicles are descended and exhibit no mass or lesion Laboratory Data:  No results for input(s): "WBC", "HGB", "HCT", "PLT" in the last 72 hours.  No results for input(s): "NA", "K", "CL", "GLUCOSE", "BUN", "CALCIUM", "CREATININE" in the last 72 hours.  Invalid input(s): "CO3"   No results found for this or any previous visit (from the past 24 hour(s)). No results found for this or any previous visit (from the past 240 hour(s)).  Renal Function: No results for input(s): "CREATININE" in the last 168 hours. Estimated Creatinine Clearance: 105.9 mL/min (by C-G formula based on SCr of 0.68 mg/dL).  Radiologic Imaging: No results found.  I independently reviewed the above imaging studies.  Procedure: Simple Foley catheterization   The patient's genitalia was prepped and draped in usual fashion.  A 16 French coud catheter was then easily inserted with return of amber urine.  The Foley catheter balloon was inflated with 20 mL of sterile water and placed to gravity drainage.  He tolerated the procedure well.  Impression/Recommendation  71 year old male with a history of dementia, AKI, BPH with urinary retention, DM2, E. coli UTI/bacteremia  -Keep Foley catheter in place for 3 to 5 days and then attempt a morning voiding trial.  Follow-up with urology as needed  Ellison Hughs, [facility] 09/06/2022, 12:17 AM

## 2022-09-06 NOTE — Discharge Instructions (Addendum)
Charles Fox catheter was replaced today.  The recommendation is for him to have this removed in 3 to 5 days and then checked to make sure that he can pee in the morning.  If this is something that cannot be performed at the facility, Dr. Jackson Latino information is on these papers for you to contact for an appointment this upcoming week.  Please do not hesitate to return with any worsening or recurring symptoms.  It was a pleasure to meet you and we hope Charles Fox feels better.

## 2022-09-10 ENCOUNTER — Encounter (HOSPITAL_COMMUNITY): Payer: Self-pay

## 2022-09-10 ENCOUNTER — Other Ambulatory Visit: Payer: Self-pay

## 2022-09-10 ENCOUNTER — Emergency Department (HOSPITAL_COMMUNITY)
Admission: EM | Admit: 2022-09-10 | Discharge: 2022-09-11 | Disposition: A | Discharge status: Skilled Nursing Facility | Payer: Self-pay | Attending: Emergency Medicine | Admitting: Emergency Medicine

## 2022-09-10 DIAGNOSIS — Z7982 Long term (current) use of aspirin: Secondary | ICD-10-CM | POA: Insufficient documentation

## 2022-09-10 DIAGNOSIS — E119 Type 2 diabetes mellitus without complications: Secondary | ICD-10-CM | POA: Insufficient documentation

## 2022-09-10 DIAGNOSIS — R509 Fever, unspecified: Secondary | ICD-10-CM

## 2022-09-10 DIAGNOSIS — R569 Unspecified convulsions: Secondary | ICD-10-CM | POA: Insufficient documentation

## 2022-09-10 DIAGNOSIS — Z79899 Other long term (current) drug therapy: Secondary | ICD-10-CM | POA: Insufficient documentation

## 2022-09-10 DIAGNOSIS — Z87891 Personal history of nicotine dependence: Secondary | ICD-10-CM | POA: Insufficient documentation

## 2022-09-10 DIAGNOSIS — I10 Essential (primary) hypertension: Secondary | ICD-10-CM | POA: Insufficient documentation

## 2022-09-10 DIAGNOSIS — Z794 Long term (current) use of insulin: Secondary | ICD-10-CM | POA: Insufficient documentation

## 2022-09-10 DIAGNOSIS — R410 Disorientation, unspecified: Secondary | ICD-10-CM | POA: Insufficient documentation

## 2022-09-10 DIAGNOSIS — Z7984 Long term (current) use of oral hypoglycemic drugs: Secondary | ICD-10-CM | POA: Insufficient documentation

## 2022-09-10 DIAGNOSIS — U071 COVID-19: Secondary | ICD-10-CM | POA: Insufficient documentation

## 2022-09-10 NOTE — ED Provider Notes (Signed)
[facility] EMERGENCY DEPARTMENT Provider Note   CSN: [id number] Arrival date & time: 09/10/22  2330     History {Add pertinent medical, surgical, social history, OB history to HPI:1} Chief Complaint  Patient presents with   Seizures    Charles Fox is a 71 y.o. male.  Patient sent to the emergency department from skilled nursing facility.  Patient transported to the ED for mental status change and possible seizure.  Patient had what staff thought was a seizure tonight at the nursing home.  Patient reportedly had a generalized shaking spell with ensuing confusion and decreased responsiveness.  No known history of seizures.       Home Medications Prior to Admission medications   Medication Sig Start Date End Date Taking? Authorizing Provider  aspirin EC 81 MG tablet Take 1 tablet (81 mg total) by mouth daily. Swallow whole. 10/14/20   [name], MD  aspirin EC 81 MG tablet Take 81 mg by mouth daily. Swallow whole.    [provider]  atorvastatin (LIPITOR) 40 MG tablet Take 1 tablet (40 mg total) by mouth daily. 10/14/20   [name], MD  atorvastatin (LIPITOR) 40 MG tablet Take 40 mg by mouth daily.    [provider]  carvedilol (COREG) 3.125 MG tablet Take 1 tablet (3.125 mg total) by mouth 2 (two) times daily with a meal. 09/24/21   Autry-Lott, Naaman Plummer, DO  carvedilol (COREG) 6.25 MG tablet Take 6.25 mg by mouth 2 (two) times daily with a meal.    [provider]  citalopram (CELEXA) 20 MG tablet Take 1 tablet (20 mg total) by mouth daily. 09/24/21   Autry-Lott, Naaman Plummer, DO  citalopram (CELEXA) 40 MG tablet Take 40 mg by mouth daily.    [provider]  finasteride (PROSCAR) 5 MG tablet Take 1 tablet (5 mg total) by mouth daily. 09/24/21   Autry-Lott, Naaman Plummer, DO  finasteride (PROSCAR) 5 MG tablet Take 5 mg by mouth daily.    [provider]  insulin glargine (LANTUS) 100 UNIT/ML Solostar Pen Inject 12 Units  into the skin daily. 08/29/22   Bonnielee Haff, MD  ipratropium-albuterol (DUONEB) 0.5-2.5 (3) MG/3ML SOLN Take 3 mLs by nebulization every 4 (four) hours as needed. 08/29/22   Bonnielee Haff, MD  irbesartan (AVAPRO) 150 MG tablet Take 150 mg by mouth daily.    [provider]  lidocaine (LIDODERM) 5 % Place 1 patch onto the skin daily. Remove & Discard patch within 12 hours or as directed by MD 09/24/21   Autry-Lott, Naaman Plummer, DO  losartan (COZAAR) 50 MG tablet Take 1.5 tablets (75 mg total) by mouth at bedtime. 09/24/21   Autry-Lott, Naaman Plummer, DO  melatonin 5 MG TABS Take 1 tablet (5 mg total) by mouth at bedtime. 09/24/21   Autry-Lott, Naaman Plummer, DO  melatonin 5 MG TABS Take 5 mg by mouth at bedtime.    [provider]  metFORMIN (GLUCOPHAGE-XR) 500 MG 24 hr tablet Take 1 tablet (500 mg total) by mouth 2 (two) times daily with a meal. 05/05/21   [name], MD  metFORMIN (GLUCOPHAGE-XR) 500 MG 24 hr tablet Take 1,000 mg by mouth 2 (two) times daily.    [provider]  nitroGLYCERIN (NITROSTAT) 0.4 MG SL tablet Place 1 tablet (0.4 mg total) under the tongue every 5 (five) minutes as needed for chest pain. 09/24/21   Autry-Lott, Naaman Plummer, DO  nitroGLYCERIN (NITROSTAT) 0.4 MG SL tablet Place 0.4 mg under the tongue every  5 (five) minutes as needed for chest pain.    [provider]  Nutritional Supplements (FEEDING SUPPLEMENT, NEPRO CARB STEADY,) LIQD Take 237 mLs by mouth 2 (two) times daily between meals. 08/29/22   Bonnielee Haff, MD  olopatadine (PATANOL) 0.1 % ophthalmic solution Place 1 drop into both eyes 2 (two) times daily as needed (dry eyes). 09/24/21   Autry-Lott, Naaman Plummer, DO  olopatadine (PATANOL) 0.1 % ophthalmic solution Place 1 drop into both eyes 2 (two) times daily as needed for allergies (dry eyes).    [provider]  QUEtiapine (SEROQUEL) 25 MG tablet Take 3 tablets (75 mg total) by mouth at bedtime. 09/24/21   Autry-Lott, Naaman Plummer, DO  QUEtiapine  (SEROQUEL) 25 MG tablet Take 75 mg by mouth at bedtime.    [provider]  saccharomyces boulardii (FLORASTOR) 250 MG capsule Take 1 capsule (250 mg total) by mouth 2 (two) times daily. 08/29/22   Bonnielee Haff, MD  senna (SENOKOT) 8.6 MG TABS tablet Take 1 tablet (8.6 mg total) by mouth daily. 09/24/21   Autry-Lott, Naaman Plummer, DO  tamsulosin (FLOMAX) 0.4 MG CAPS capsule Take 2 capsules (0.8 mg total) by mouth daily. 09/24/21   Autry-Lott, Naaman Plummer, DO  tamsulosin (FLOMAX) 0.4 MG CAPS capsule Take 0.8 mg by mouth daily after breakfast.    [provider]      Allergies    Patient has no known allergies.    Review of Systems   Review of Systems  Physical Exam Updated Vital Signs There were no vitals taken for this visit. Physical Exam Vitals and nursing note reviewed.  Constitutional:      General: He is not in acute distress.    Appearance: He is well-developed.  HENT:     Head: Normocephalic and atraumatic.     Mouth/Throat:     Mouth: Mucous membranes are moist.  Eyes:     General: Vision grossly intact. Gaze aligned appropriately.     Extraocular Movements: Extraocular movements intact.     Conjunctiva/sclera: Conjunctivae normal.  Cardiovascular:     Rate and Rhythm: Normal rate and regular rhythm.     Pulses: Normal pulses.     Heart sounds: Normal heart sounds, S1 normal and S2 normal. No murmur heard.    No friction rub. No gallop.  Pulmonary:     Effort: Pulmonary effort is normal. No respiratory distress.     Breath sounds: Normal breath sounds.  Abdominal:     Palpations: Abdomen is soft.     Tenderness: There is no abdominal tenderness. There is no guarding or rebound.     Hernia: No hernia is present.  Musculoskeletal:        General: No swelling.     Cervical back: Full passive range of motion without pain, normal range of motion and neck supple. No pain with movement, spinous process tenderness or muscular tenderness. Normal range of motion.      Right lower leg: No edema.     Left lower leg: No edema.  Skin:    General: Skin is warm and dry.     Capillary Refill: Capillary refill takes less than 2 seconds.     Findings: No ecchymosis, erythema, lesion or wound.  Neurological:     Mental Status: He is alert. He is disoriented and confused.     Cranial Nerves: Cranial nerves 2-12 are intact.     Sensory: Sensation is intact.     Motor: Motor function is intact. No weakness or abnormal  muscle tone.     Coordination: Coordination is intact.  Psychiatric:        Mood and Affect: Mood normal.        Speech: Speech normal.        Behavior: Behavior normal.     ED Results / Procedures / Treatments   Labs (all labs ordered are listed, but only abnormal results are displayed) Labs Reviewed  COMPREHENSIVE METABOLIC PANEL  CBC WITH DIFFERENTIAL/PLATELET  URINALYSIS, ROUTINE W REFLEX MICROSCOPIC  LIPASE, BLOOD  CBG MONITORING, ED  TROPONIN I (HIGH SENSITIVITY)    EKG EKG Interpretation  Date/Time:  Saturday September 10 2022 23:44:30 EST Ventricular Rate:  108 PR Interval:  162 QRS Duration: 89 QT Interval:  347 QTC Calculation: 466 R Axis:   177 Text Interpretation: Sinus tachycardia Right axis deviation No significant change since last tracing Confirmed by Orpah Greek [phone]) on 09/10/2022 11:53:18 PM  Radiology No results found.  Procedures Procedures  {Document cardiac monitor, telemetry assessment procedure when appropriate:1}  Medications Ordered in ED Medications - No data to display  ED Course/ Medical Decision Making/ A&P                           Medical Decision Making Amount and/or Complexity of Data Reviewed Labs: ordered. Radiology: ordered.   ***  {Document critical care time when appropriate:1} {Document review of labs and clinical decision tools ie heart score, Chads2Vasc2 etc:1}  {Document your independent review of radiology images, and any outside records:1} {Document your  discussion with family members, caretakers, and with consultants:1} {Document social determinants of health affecting pt's care:1} {Document your decision making why or why not admission, treatments were needed:1} Final Clinical Impression(s) / ED Diagnoses Final diagnoses:  None    Rx / DC Orders ED Discharge Orders     None

## 2022-09-10 NOTE — ED Triage Notes (Signed)
Pt from SNF, staff called EMS after they believe pt had a seizure

## 2022-09-11 ENCOUNTER — Emergency Department (HOSPITAL_COMMUNITY): Payer: Self-pay

## 2022-09-11 DIAGNOSIS — R569 Unspecified convulsions: Secondary | ICD-10-CM

## 2022-09-11 LAB — CBC WITH DIFFERENTIAL/PLATELET
Abs Immature Granulocytes: 0.09 10*3/uL — ABNORMAL HIGH (ref 0.00–0.07)
Basophils Absolute: 0.1 10*3/uL (ref 0.0–0.1)
Basophils Relative: 1 %
Eosinophils Absolute: 0.1 10*3/uL (ref 0.0–0.5)
Eosinophils Relative: 1 %
HCT: 29.6 % — ABNORMAL LOW (ref 39.0–52.0)
Hemoglobin: 9.3 g/dL — ABNORMAL LOW (ref 13.0–17.0)
Immature Granulocytes: 1 %
Lymphocytes Relative: 10 %
Lymphs Abs: 1.1 10*3/uL (ref 0.7–4.0)
MCH: 30.3 pg (ref 26.0–34.0)
MCHC: 31.4 g/dL (ref 30.0–36.0)
MCV: 96.4 fL (ref 80.0–100.0)
Monocytes Absolute: 1.2 10*3/uL — ABNORMAL HIGH (ref 0.1–1.0)
Monocytes Relative: 11 %
Neutro Abs: 8.4 10*3/uL — ABNORMAL HIGH (ref 1.7–7.7)
Neutrophils Relative %: 76 %
Platelets: 462 10*3/uL — ABNORMAL HIGH (ref 150–400)
RBC: 3.07 MIL/uL — ABNORMAL LOW (ref 4.22–5.81)
RDW: 14.6 % (ref 11.5–15.5)
WBC: 11 10*3/uL — ABNORMAL HIGH (ref 4.0–10.5)
nRBC: 0 % (ref 0.0–0.2)

## 2022-09-11 LAB — COMPREHENSIVE METABOLIC PANEL
ALT: 24 U/L (ref 0–44)
AST: 32 U/L (ref 15–41)
Albumin: 2.6 g/dL — ABNORMAL LOW (ref 3.5–5.0)
Alkaline Phosphatase: 124 U/L (ref 38–126)
Anion gap: 13 (ref 5–15)
BUN: 13 mg/dL (ref 8–23)
CO2: 20 mmol/L — ABNORMAL LOW (ref 22–32)
Calcium: 8.7 mg/dL — ABNORMAL LOW (ref 8.9–10.3)
Chloride: 97 mmol/L — ABNORMAL LOW (ref 98–111)
Creatinine, Ser: 0.97 mg/dL (ref 0.61–1.24)
GFR, Estimated: >60 mL/min (ref >60)
Glucose, Bld: 117 mg/dL — ABNORMAL HIGH (ref 70–99)
Potassium: 4.8 mmol/L (ref 3.5–5.1)
Sodium: 130 mmol/L — ABNORMAL LOW (ref 135–145)
Total Bilirubin: 0.4 mg/dL (ref 0.3–1.2)
Total Protein: 7.1 g/dL (ref 6.5–8.1)

## 2022-09-11 LAB — RESP PANEL BY RT-PCR (RSV, FLU A&B, COVID)  RVPGX2
Influenza A by PCR: NEGATIVE
Influenza B by PCR: NEGATIVE
Resp Syncytial Virus by PCR: NEGATIVE
SARS Coronavirus 2 by RT PCR: POSITIVE — AB

## 2022-09-11 LAB — LACTIC ACID, PLASMA
Lactic Acid, Venous: 4 mmol/L (ref 0.5–1.9)
Lactic Acid, Venous: 1.7 mmol/L (ref 0.5–1.9)

## 2022-09-11 LAB — URINALYSIS, ROUTINE W REFLEX MICROSCOPIC
Bilirubin Urine: NEGATIVE
Glucose, UA: NEGATIVE mg/dL
Hgb urine dipstick: NEGATIVE
Ketones, ur: NEGATIVE mg/dL
Nitrite: NEGATIVE
Protein, ur: 30 mg/dL — AB
Specific Gravity, Urine: 1.018 (ref 1.005–1.030)
pH: 6 (ref 5.0–8.0)

## 2022-09-11 LAB — LIPASE, BLOOD: Lipase: 29 U/L (ref 11–51)

## 2022-09-11 LAB — TROPONIN I (HIGH SENSITIVITY)
Troponin I (High Sensitivity): 6 ng/L (ref <18)
Troponin I (High Sensitivity): 6 ng/L (ref <18)

## 2022-09-11 MED ORDER — ACETAMINOPHEN 325 MG PO TABS
650.0000 mg | ORAL_TABLET | Freq: Once | ORAL | Status: AC
  Administered 2022-09-11: 650 mg via ORAL
  Filled 2022-09-11: qty 2

## 2022-09-11 MED ORDER — DIVALPROEX SODIUM 500 MG PO DR TAB: 500.0000 mg | DELAYED_RELEASE_TABLET | Freq: Two times a day (BID) | ORAL | 0 refills | Status: AC

## 2022-09-11 MED ORDER — VALPROATE SODIUM 100 MG/ML IV SOLN
1500.0000 mg | Freq: Once | INTRAVENOUS | Status: AC
  Administered 2022-09-11: 1500 mg via INTRAVENOUS
  Filled 2022-09-11: qty 15

## 2022-09-11 MED ORDER — DIVALPROEX SODIUM 250 MG PO DR TAB
500.0000 mg | DELAYED_RELEASE_TABLET | Freq: Two times a day (BID) | ORAL | Status: DC
  Administered 2022-09-11: 500 mg via ORAL
  Filled 2022-09-11: qty 2

## 2022-09-11 MED ORDER — LACTATED RINGERS IV BOLUS
1000.0000 mL | Freq: Once | INTRAVENOUS | Status: AC
  Administered 2022-09-11: 1000 mL via INTRAVENOUS

## 2022-09-11 NOTE — ED Notes (Signed)
I attempted to call [facility], but the phone kept ringing

## 2022-09-11 NOTE — Consult Note (Signed)
Neurology Consultation Reason for Consult: Seizure Referring Physician: Pollina, C  CC: Seizure  History is obtained from: Patient, chart  Charles Fox: Charles Fox is a 71 y.o. male whom I recently met last month after a fall of unclear etiology.  At the time, there was concern procedure, but this is not definite and workup was negative.  Tonight, he was seen to have seizure-like activity at his group home.  He was initially postictal, but is improved and is now talking and interactive.  He was febrile at 101.  He currently denies headache, denies cough.  He denies that he had a seizure that he has ever had a seizure.   Past Medical History:  Diagnosis Date   Cataract    Mixed form OU   Chest pain 02/26/2021   Diabetes mellitus without complication ([facility])    Diabetic retinopathy ([place])    PDR OU   Hypertension    Hypertensive retinopathy    OU   Lacunar infarction [facility])    Brain MRI  03/2021: small remote right cerebellar lacunar infarct   Pain, dental 11/20/2019   Skin lesions, generalized 05/09/2018   Tendinopathy of left rotator cuff 04/29/2019     History reviewed. No pertinent family history.   Social History:  reports that he has quit smoking. His smoking use included cigarettes. He has never used smokeless tobacco. He reports that he does not currently use drugs. No history on file for alcohol use.   Exam: Current vital signs: BP 116/68   Pulse (!) 103   Temp (!) 101 F (38.3 C) (Rectal)   Resp (!) 24   SpO2 100%  Vital signs in last 24 hours: Temp:  [101 F (38.3 C)] 101 F (38.3 C) (12/24 0003) Pulse Rate:  [93-106] 103 (12/24 0300) Resp:  [11-27] 24 (12/24 0300) BP: (81-130)/(33-75) 116/68 (12/24 0300) SpO2:  [87 %-100 %] 100 % (12/24 0300)   Physical Exam  Appears well-developed and well-nourished.   Neuro: Mental Status: Patient is awake, alert, oriented to person, place, month. He does appear slightly confused, slightly slow to answer and unsure of his  answers. No signs of aphasia or neglect Cranial Nerves: II: Visual Fields are full. Pupils are equal, round, and reactive to light.   III,IV, VI: EOMI without ptosis or diploplia.  V: Facial sensation is symmetric to temperature VII: Facial movement is symmetric.  VIII: hearing is intact to voice X: Uvula elevates symmetrically XI: Shoulder shrug is symmetric. XII: tongue is midline without atrophy or fasciculations.  Motor: Tone is normal. Bulk is normal. 5/5 strength was present in all four extremities.  Sensory: Sensation is symmetric to light touch and temperature in the arms and legs. Cerebellar: No definite ataxia   I have reviewed labs in epic and the results pertinent to this consultation are: WBC 11  I have reviewed the images obtained: CT head-negative  Impression: 71 year old male with a history of a single previous episode concerning for possible seizure who presents with seizure.  In the setting of dementia, with new onset seizure, would recommend treating with antiepileptic therapy.  He did have a fever, and if no clear source then he will need lumbar puncture.  He does have flu and COVID going around his nursing home and therefore if his respiratory panel comes back positive, this is likely etiology and no LP would be needed.   Recommendations: 1) consider LP if no other source of fever identified 2) Depakote 1.5 g x 1 followed by  500 mg twice daily 3) If admitted, could consider repeat EEG 4) Neurology will follow if admitted   Roland Rack, MD [facility] [phone]  If [place], please page neurology on call as listed in [place].

## 2022-09-11 NOTE — ED Provider Notes (Signed)
  Physical Exam  BP 119/73   Pulse (!) 105   Temp 99.8 F (37.7 C) (Oral)   Resp (!) 29   SpO2 100%   Physical Exam Vitals reviewed.     Procedures  Procedures  ED Course / MDM    Medical Decision Making Amount and/or Complexity of Data Reviewed Labs: ordered. Radiology: ordered.  Risk OTC drugs.   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 1-seizure plan Depakote 500 mg twice daily and will refer for outpatient follow-up and give seizure precautions 2 COVID patient with unknown onset of symptoms, will alert guardian and give return precautions as well as advised that he is quarantined through symptoms 3 anemia hemoglobin is 9.3 review of priors revealed that this has been stable over the past 6 weeks but will need to be followed up as outpatient Call placed to legal guardian Creig Hines at [phone] No voice mail set up    Pattricia Boss, MD 09/11/22 [phone]

## 2022-09-11 NOTE — ED Notes (Signed)
Called [facility] for transport back to [place] center.

## 2022-09-11 NOTE — Discharge Instructions (Addendum)
Please take depakote as prescribed.  Prescription was sent to the [facility] on [address] as the usual pharmacy is not open on the weekends Return if any new seizure activity. Recheck with neurology in one week Quarantine for covid Use tylenol and drink plenty of fluids

## 2022-09-11 NOTE — ED Notes (Signed)
All labs collected and sent to lab at 0042. 2 calls have been made to lab and still no results thus far

## 2022-09-12 LAB — URINE CULTURE: Culture: NO GROWTH

## 2022-09-16 LAB — CULTURE, BLOOD (ROUTINE X 2)
Culture: NO GROWTH
Culture: NO GROWTH
Special Requests: ADEQUATE
Special Requests: ADEQUATE

## 2022-10-24 ENCOUNTER — Emergency Department (HOSPITAL_COMMUNITY): Payer: Medicare Other

## 2022-10-24 ENCOUNTER — Inpatient Hospital Stay: Payer: Medicare Other | Admitting: Neurology

## 2022-10-24 ENCOUNTER — Inpatient Hospital Stay (HOSPITAL_COMMUNITY)
Admission: EM | Admit: 2022-10-24 | Discharge: 2022-11-18 | DRG: 640 | Disposition: E | Payer: Medicare Other | Source: Skilled Nursing Facility | Attending: Internal Medicine | Admitting: Internal Medicine

## 2022-10-24 DIAGNOSIS — G9341 Metabolic encephalopathy: Secondary | ICD-10-CM | POA: Diagnosis present

## 2022-10-24 DIAGNOSIS — H6091 Unspecified otitis externa, right ear: Secondary | ICD-10-CM | POA: Diagnosis not present

## 2022-10-24 DIAGNOSIS — J690 Pneumonitis due to inhalation of food and vomit: Secondary | ICD-10-CM

## 2022-10-24 DIAGNOSIS — G4700 Insomnia, unspecified: Secondary | ICD-10-CM | POA: Diagnosis present

## 2022-10-24 DIAGNOSIS — E113593 Type 2 diabetes mellitus with proliferative diabetic retinopathy without macular edema, bilateral: Secondary | ICD-10-CM | POA: Diagnosis present

## 2022-10-24 DIAGNOSIS — Z7984 Long term (current) use of oral hypoglycemic drugs: Secondary | ICD-10-CM

## 2022-10-24 DIAGNOSIS — F0280 Dementia in other diseases classified elsewhere without behavioral disturbance: Secondary | ICD-10-CM | POA: Diagnosis not present

## 2022-10-24 DIAGNOSIS — E785 Hyperlipidemia, unspecified: Secondary | ICD-10-CM | POA: Diagnosis present

## 2022-10-24 DIAGNOSIS — Z794 Long term (current) use of insulin: Secondary | ICD-10-CM | POA: Diagnosis not present

## 2022-10-24 DIAGNOSIS — I10 Essential (primary) hypertension: Secondary | ICD-10-CM | POA: Diagnosis present

## 2022-10-24 DIAGNOSIS — Z79899 Other long term (current) drug therapy: Secondary | ICD-10-CM

## 2022-10-24 DIAGNOSIS — F02B11 Dementia in other diseases classified elsewhere, moderate, with agitation: Secondary | ICD-10-CM | POA: Diagnosis not present

## 2022-10-24 DIAGNOSIS — E870 Hyperosmolality and hypernatremia: Principal | ICD-10-CM | POA: Diagnosis present

## 2022-10-24 DIAGNOSIS — Z8616 Personal history of COVID-19: Secondary | ICD-10-CM

## 2022-10-24 DIAGNOSIS — F02C11 Dementia in other diseases classified elsewhere, severe, with agitation: Secondary | ICD-10-CM | POA: Diagnosis present

## 2022-10-24 DIAGNOSIS — N320 Bladder-neck obstruction: Secondary | ICD-10-CM | POA: Diagnosis present

## 2022-10-24 DIAGNOSIS — E861 Hypovolemia: Secondary | ICD-10-CM | POA: Diagnosis present

## 2022-10-24 DIAGNOSIS — F32A Depression, unspecified: Secondary | ICD-10-CM | POA: Diagnosis present

## 2022-10-24 DIAGNOSIS — E860 Dehydration: Secondary | ICD-10-CM | POA: Diagnosis present

## 2022-10-24 DIAGNOSIS — G309 Alzheimer's disease, unspecified: Secondary | ICD-10-CM | POA: Diagnosis present

## 2022-10-24 DIAGNOSIS — Z515 Encounter for palliative care: Secondary | ICD-10-CM

## 2022-10-24 DIAGNOSIS — E1149 Type 2 diabetes mellitus with other diabetic neurological complication: Secondary | ICD-10-CM | POA: Diagnosis present

## 2022-10-24 DIAGNOSIS — F02C3 Dementia in other diseases classified elsewhere, severe, with mood disturbance: Secondary | ICD-10-CM | POA: Diagnosis present

## 2022-10-24 DIAGNOSIS — Z8673 Personal history of transient ischemic attack (TIA), and cerebral infarction without residual deficits: Secondary | ICD-10-CM

## 2022-10-24 DIAGNOSIS — E876 Hypokalemia: Secondary | ICD-10-CM | POA: Diagnosis not present

## 2022-10-24 DIAGNOSIS — H35033 Hypertensive retinopathy, bilateral: Secondary | ICD-10-CM | POA: Diagnosis present

## 2022-10-24 DIAGNOSIS — Z66 Do not resuscitate: Secondary | ICD-10-CM | POA: Diagnosis not present

## 2022-10-24 DIAGNOSIS — E118 Type 2 diabetes mellitus with unspecified complications: Secondary | ICD-10-CM | POA: Diagnosis present

## 2022-10-24 DIAGNOSIS — R627 Adult failure to thrive: Secondary | ICD-10-CM | POA: Diagnosis present

## 2022-10-24 DIAGNOSIS — E440 Moderate protein-calorie malnutrition: Secondary | ICD-10-CM | POA: Diagnosis present

## 2022-10-24 DIAGNOSIS — R0609 Other forms of dyspnea: Secondary | ICD-10-CM | POA: Diagnosis not present

## 2022-10-24 DIAGNOSIS — I444 Left anterior fascicular block: Secondary | ICD-10-CM | POA: Diagnosis present

## 2022-10-24 DIAGNOSIS — R4182 Altered mental status, unspecified: Secondary | ICD-10-CM | POA: Diagnosis not present

## 2022-10-24 DIAGNOSIS — Z7982 Long term (current) use of aspirin: Secondary | ICD-10-CM

## 2022-10-24 DIAGNOSIS — Z1152 Encounter for screening for COVID-19: Secondary | ICD-10-CM

## 2022-10-24 DIAGNOSIS — G40409 Other generalized epilepsy and epileptic syndromes, not intractable, without status epilepticus: Secondary | ICD-10-CM | POA: Diagnosis present

## 2022-10-24 DIAGNOSIS — Z6829 Body mass index (BMI) 29.0-29.9, adult: Secondary | ICD-10-CM

## 2022-10-24 DIAGNOSIS — F0390 Unspecified dementia without behavioral disturbance: Secondary | ICD-10-CM | POA: Diagnosis present

## 2022-10-24 DIAGNOSIS — R338 Other retention of urine: Secondary | ICD-10-CM | POA: Diagnosis present

## 2022-10-24 DIAGNOSIS — Z9981 Dependence on supplemental oxygen: Secondary | ICD-10-CM

## 2022-10-24 DIAGNOSIS — N401 Enlarged prostate with lower urinary tract symptoms: Secondary | ICD-10-CM | POA: Diagnosis present

## 2022-10-24 LAB — COMPREHENSIVE METABOLIC PANEL
ALT: 21 U/L (ref 0–44)
AST: 34 U/L (ref 15–41)
Albumin: 2 g/dL — ABNORMAL LOW (ref 3.5–5.0)
Alkaline Phosphatase: 76 U/L (ref 38–126)
Anion gap: 8 (ref 5–15)
BUN: 41 mg/dL — ABNORMAL HIGH (ref 8–23)
CO2: 32 mmol/L (ref 22–32)
Calcium: 8.9 mg/dL (ref 8.9–10.3)
Chloride: 125 mmol/L — ABNORMAL HIGH (ref 98–111)
Creatinine, Ser: 1.08 mg/dL (ref 0.61–1.24)
GFR, Estimated: >60 mL/min (ref >60)
Glucose, Bld: 300 mg/dL — ABNORMAL HIGH (ref 70–99)
Potassium: 3.9 mmol/L (ref 3.5–5.1)
Sodium: 165 mmol/L (ref 135–145)
Total Bilirubin: 0.3 mg/dL (ref 0.3–1.2)
Total Protein: 8.1 g/dL (ref 6.5–8.1)

## 2022-10-24 LAB — I-STAT VENOUS BLOOD GAS, ED
Acid-Base Excess: 8 mmol/L — ABNORMAL HIGH (ref 0.0–2.0)
Bicarbonate: 35.2 mmol/L — ABNORMAL HIGH (ref 20.0–28.0)
Calcium, Ion: 1.3 mmol/L (ref 1.15–1.40)
HCT: 34 % — ABNORMAL LOW (ref 39.0–52.0)
Hemoglobin: 11.6 g/dL — ABNORMAL LOW (ref 13.0–17.0)
O2 Saturation: 99 %
Potassium: 3.9 mmol/L (ref 3.5–5.1)
Sodium: 170 mmol/L (ref 135–145)
TCO2: 37 mmol/L — ABNORMAL HIGH (ref 22–32)
pCO2, Ven: 62.2 mmHg — ABNORMAL HIGH (ref 44–60)
pH, Ven: 7.361 (ref 7.25–7.43)
pO2, Ven: 158 mmHg — ABNORMAL HIGH (ref 32–45)

## 2022-10-24 LAB — I-STAT CHEM 8, ED
BUN: 41 mg/dL — ABNORMAL HIGH (ref 8–23)
Calcium, Ion: 1.29 mmol/L (ref 1.15–1.40)
Chloride: 125 mmol/L — ABNORMAL HIGH (ref 98–111)
Creatinine, Ser: 1.2 mg/dL (ref 0.61–1.24)
Glucose, Bld: 298 mg/dL — ABNORMAL HIGH (ref 70–99)
HCT: 34 % — ABNORMAL LOW (ref 39.0–52.0)
Hemoglobin: 11.6 g/dL — ABNORMAL LOW (ref 13.0–17.0)
Potassium: 3.9 mmol/L (ref 3.5–5.1)
Sodium: 170 mmol/L (ref 135–145)
TCO2: 33 mmol/L — ABNORMAL HIGH (ref 22–32)

## 2022-10-24 LAB — TSH: TSH: 2.286 u[IU]/mL (ref 0.350–4.500)

## 2022-10-24 LAB — CBG MONITORING, ED
Glucose-Capillary: 235 mg/dL — ABNORMAL HIGH (ref 70–99)
Glucose-Capillary: 292 mg/dL — ABNORMAL HIGH (ref 70–99)

## 2022-10-24 LAB — URINALYSIS, ROUTINE W REFLEX MICROSCOPIC
Bilirubin Urine: NEGATIVE
Glucose, UA: 50 mg/dL — AB
Ketones, ur: NEGATIVE mg/dL
Nitrite: NEGATIVE
Protein, ur: 100 mg/dL — AB
Specific Gravity, Urine: 1.017 (ref 1.005–1.030)
WBC, UA: >50 WBC/hpf (ref 0–5)
pH: 5 (ref 5.0–8.0)

## 2022-10-24 LAB — CBC WITH DIFFERENTIAL/PLATELET
Abs Immature Granulocytes: 0.18 K/uL — ABNORMAL HIGH (ref 0.00–0.07)
Basophils Absolute: 0 K/uL (ref 0.0–0.1)
Basophils Relative: 0 %
Eosinophils Absolute: 0.1 K/uL (ref 0.0–0.5)
Eosinophils Relative: 1 %
HCT: 38.2 % — ABNORMAL LOW (ref 39.0–52.0)
Hemoglobin: 11.2 g/dL — ABNORMAL LOW (ref 13.0–17.0)
Immature Granulocytes: 1 %
Lymphocytes Relative: 14 %
Lymphs Abs: 2 K/uL (ref 0.7–4.0)
MCH: 29.9 pg (ref 26.0–34.0)
MCHC: 29.3 g/dL — ABNORMAL LOW (ref 30.0–36.0)
MCV: 101.9 fL — ABNORMAL HIGH (ref 80.0–100.0)
Monocytes Absolute: 0.9 K/uL (ref 0.1–1.0)
Monocytes Relative: 6 %
Neutro Abs: 11.5 K/uL — ABNORMAL HIGH (ref 1.7–7.7)
Neutrophils Relative %: 78 %
Platelets: 354 K/uL (ref 150–400)
RBC: 3.75 MIL/uL — ABNORMAL LOW (ref 4.22–5.81)
RDW: 16.9 % — ABNORMAL HIGH (ref 11.5–15.5)
WBC: 14.6 K/uL — ABNORMAL HIGH (ref 4.0–10.5)
nRBC: 0 % (ref 0.0–0.2)

## 2022-10-24 LAB — RESP PANEL BY RT-PCR (RSV, FLU A&B, COVID)  RVPGX2
Influenza A by PCR: NEGATIVE
Influenza B by PCR: NEGATIVE
Resp Syncytial Virus by PCR: NEGATIVE
SARS Coronavirus 2 by RT PCR: NEGATIVE

## 2022-10-24 LAB — MAGNESIUM: Magnesium: 2.6 mg/dL — ABNORMAL HIGH (ref 1.7–2.4)

## 2022-10-24 LAB — PHOSPHORUS: Phosphorus: 2.1 mg/dL — ABNORMAL LOW (ref 2.5–4.6)

## 2022-10-24 LAB — LACTIC ACID, PLASMA
Lactic Acid, Venous: 1.8 mmol/L (ref 0.5–1.9)
Lactic Acid, Venous: 1.7 mmol/L (ref 0.5–1.9)

## 2022-10-24 LAB — T4, FREE: Free T4: 1.01 ng/dL (ref 0.61–1.12)

## 2022-10-24 MED ORDER — DEXTROSE 5 % IV SOLN: INTRAVENOUS | Status: DC

## 2022-10-24 MED ORDER — ENOXAPARIN SODIUM 40 MG/0.4ML IJ SOSY
40.0000 mg | PREFILLED_SYRINGE | INTRAMUSCULAR | Status: DC
  Administered 2022-10-25 – 2022-11-02 (×9): 40 mg via SUBCUTANEOUS
  Filled 2022-10-24 (×9): qty 0.4

## 2022-10-24 MED ORDER — DEXTROSE 5 % IV SOLN
250.0000 mg | INTRAVENOUS | Status: DC
  Filled 2022-10-24: qty 2.5

## 2022-10-24 MED ORDER — SODIUM CHLORIDE 0.9 % IV SOLN: 750.0000 mg | Freq: Two times a day (BID) | INTRAVENOUS | Status: DC

## 2022-10-24 MED ORDER — VALPROATE SODIUM 100 MG/ML IV SOLN
250.0000 mg | Freq: Two times a day (BID) | INTRAVENOUS | Status: DC
  Administered 2022-10-25 – 2022-10-29 (×10): 250 mg via INTRAVENOUS
  Filled 2022-10-24 (×13): qty 2.5

## 2022-10-24 MED ORDER — SODIUM CHLORIDE 0.9 % IV SOLN
3.0000 g | Freq: Once | INTRAVENOUS | Status: AC
  Administered 2022-10-24: 3 g via INTRAVENOUS
  Filled 2022-10-24: qty 8

## 2022-10-24 MED ORDER — SODIUM CHLORIDE 0.9 % IV SOLN
500.0000 mg | Freq: Once | INTRAVENOUS | Status: AC
  Administered 2022-10-24: 500 mg via INTRAVENOUS
  Filled 2022-10-24: qty 5

## 2022-10-24 NOTE — Progress Notes (Signed)
PCCM Brief Note  [facility] called for admission by EDP. They asked that PCCM come down to eyeball pt for 2nd opinion that pt did not require intubation.  On exam, he is somnolent but does open eyes some to noxious stimuli and does have intact cough and gag. Some thick secretions suctioned out of posterior oropharynx. Pt continued to have strong cough thereafter.  Discussed with Dr. Vanita Panda, states this has been similar to pts course. He and resident both agree that no intubation needed at this time.  OK for SDU admission under [facility].   Montey Hora, [facility] For pager details, please see AMION or use Epic chat  After 1900, please call [facility] for cross coverage needs 10/26/2022, 10:24 PM

## 2022-10-24 NOTE — H&P (Incomplete)
Date: 10/25/2022               Patient Name:  Charles Fox MRN: [id number]  DOB: Feb 11, 1951 Age / Sex: 72 y.o., male   PCP: Pcp, No         Medical Service: Internal Medicine Teaching Service         Attending Physician: Dr. Evette Doffing, Charles Fox, *      First Contact: Dr. Nani Gasser, MD Pager [phone]    Second Contact: Dr. Buddy Duty, DO Pager [phone]         After Hours (After 5p/  First Contact Pager: [phone]  weekends / holidays): Second Contact Pager: [phone]   SUBJECTIVE   Chief Complaint: Altered Mental Status   History of Present Illness: This is a 72 year old male with a past medical history of dementia, type 2 diabetes, hypertension, hyperlipidemia, prior stroke, epilepsy on Depakote, recent hospitalization for E. coli bacteremia presenting for altered mental status.  Patient is not able to give any history given mental status, and history is obtained from [place], who is a Marine scientist at facility in [place] care center.  History is also obtained from chart review.  Per Charles Fox, patient has been declining since he went back to [facility] care center after previous hospitalization. He was doing quite well after discharge.  At his baseline he is alert and oriented x 2 and is able to hold a conversation.  Per nursing staff patient has been slowly declining, becoming less responsive, and not eating well.  Before the weekend, patient was still able to respond, but minimally, and able to eat partially.  Today, patient was found unresponsive and not able to be awakened.  EMS was called.  Progress notes were sent with the patient.  Per chart review, patient was admitted for altered mental status from 08/08/2022-08/30/23 in which patient was found down on a curb.  Throughout hospital course, patient declined, patient was started on vancomycin and Unasyn for pneumonia coverage, required intubation, antibiotics were transitioned to ceftriaxone once blood cultures grew E.  coli.  Patient also required vasopressor therapy for septic shock.  Patient was then extubated, and progressed well and eventually discharged to finish doxycycline in the outpatient setting. Patient had urinary retention at the time, and patient was discharged on a Foley with most recent Foley changed on 09/05/2022.  Since, patient started to improve until 09/13/2022 in which patient had a grand mal seizure and diagnosed with COVID in which patient went to the ED and discharged on Depakote 500 mg twice daily.  Patient then started having a gradual decline since his COVID infection, and is becoming more lethargic.  On September 23, 2022, patient was seen by in-house provider, and had a dose of IM Rocephin and patient was started on Lasix for pulmonary edema.  Patient continues to decline and had increased confusion from then on.  Per chart review, patient received another dose of ceftriaxone on 10/18/2022.  Patient did have an episode of chest pain on 10/17/2022, and at that time had productive cough, and chest congestion.  Patient has since started course of ceftriaxone for aspiration pneumonia treatment on 10/21/2022 with ceftriaxone for 7 days.  Given patient not improving, and patient found unresponsive today, patient was brought to the emergency room.  Attempted was made to call legal guardian x2 and there was no voicemail set up to leave a message. Will have to attempt to call legal guardian again in the AM.   ED  Course: On arrival to the emergency room, initial vital signs showed blood pressure 160/80, heart rate 100, respiratory 20, with oxygenation saturation at 100% room air.  In the emergency department, initial labs showed glucose 235, negative respiratory panel, white count of 14.6, sodium 165, BUN 41, normal lactate, normal thyroid studies, mag 2.6, phosphorus 2.1 ABG showed pH 7.36 and pCO2 of 62.2.  CT head negative for any acute intracranial abnormality.  Chest x-ray showing atelectasis versus  infiltrate at left base with aspiration not excluded and subsegmental atelectasis of right base.  EDP started patient on azithromycin and Unasyn started patient on dextrose 5% at 140 cc/hr. EPD the consulted IMTS to admit.   Past Medical History Past Medical History:  Diagnosis Date   Cataract    Mixed form OU   Chest pain 02/26/2021   Diabetes mellitus without complication (HCC)    Diabetic retinopathy ([place])    PDR OU   Hypertension    Hypertensive retinopathy    OU   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[place])    Brain MRI  03/2021: small remote right cerebellar lacunar infarct   Pain, dental 11/20/2019   Skin lesions, generalized 05/09/2018   Tendinopathy of left rotator cuff 04/29/2019     Meds:  Aspirin 81 mg daily  Atorvastin 40 mg daily  Carvidelol 6.25 BID Citalopram 40 mg daily  Depakote 500 mg BID  Feeding supplement  Finasteride 5 mg daily  Lantus 12 units daily  Duo nebs  Irbesartan 150 mg  Melatonin 5 mg  Metformin 1000 mg BID  Nitroglycerin  Olopatadine 0.1% ophthalmic solution  Quetiapine 75 mg nightly  Saccharomyces boulardii 250 mg BID Tamsulosin 0.'8mg'$  daily    Past Surgical History  Past Surgical History:  Procedure Laterality Date   BACK SURGERY     FINGER SURGERY      Social:  Lives With: [facility]  Occupation: N/A Support: Legal Guardian is Scientist, research (medical)  Level of Function: Dependent in all ADLs and iADLS PCP: In house doctor Dr. Fabienne Bruns Substances: N/A  Family History: N/A  Allergies: Allergies as of 11/11/2022   (No Known Allergies)    Review of Systems: A complete ROS was negative except as per HPI.   OBJECTIVE:   Physical Exam: Blood pressure (!) 155/100, pulse (!) 109, temperature 98.6 F (37 C), temperature source Axillary, resp. rate 20, SpO2 98 %.  Constitutional: Lethargic, unable to communicate, unable to track HENT: Very dry mucous membranes noted.  Seems as if part food particles are left in mouth.  Poor  dentition.  Very thick secretions noted to oropharynx. Eyes: Unable to open, unable to assess extraocular movement.  PERRLA Neck: supple Cardiovascular: Tachycardic rate of 107, no murmurs, rubs, gallops Pulmonary/Chest: Decreased lung sounds heard throughout Abdominal: soft, non-tender, normoactive bowel sounds  MSK: normal bulk and tone Neurological: GCS 6 Skin: decreased skin turgor   Labs: CBC    Component Value Date/Time   WBC 14.6 (H) 10/27/2022 1745   RBC 3.75 (L) 10/28/2022 1745   HGB 11.6 (L) 11/12/2022 1827   HGB 15.4 02/25/2021 1009   HCT 34.0 (L) 11/16/2022 1827   HCT 44.6 02/25/2021 1009   PLT 354 11/13/2022 1745   PLT 304 02/25/2021 1009   MCV 101.9 (H) 10/25/2022 1745   MCV 86 02/25/2021 1009   MCH 29.9 10/30/2022 1745   MCHC 29.3 (L) 10/25/2022 1745   RDW 16.9 (H) 10/29/2022 1745   RDW 12.0 02/25/2021 1009   LYMPHSABS 2.0 11/03/2022 1745  MONOABS 0.9 11/10/2022 1745   EOSABS 0.1 11/09/2022 1745   BASOSABS 0.0 10/28/2022 1745     CMP     Component Value Date/Time   NA 162 (HH) 10/25/2022 0412   NA 137 02/25/2021 1009   K 3.9 11/03/2022 1827   CL 125 (H) 11/12/2022 1826   CO2 32 11/09/2022 1745   GLUCOSE 298 (H) 10/30/2022 1826   BUN 41 (H) 11/08/2022 1826   BUN 10 02/25/2021 1009   CREATININE 1.20 11/11/2022 1826   CREATININE 0.81 03/18/2016 1445   CALCIUM 8.9 10/22/2022 1745   PROT 8.1 11/06/2022 1745   PROT 7.1 05/26/2020 1601   ALBUMIN 2.0 (L) 11/15/2022 1745   ALBUMIN 4.1 05/26/2020 1601   AST 34 11/09/2022 1745   ALT 21 10/27/2022 1745   ALKPHOS 76 11/10/2022 1745   BILITOT 0.3 10/31/2022 1745   BILITOT 0.5 05/26/2020 1601   GFRNONAA >60 10/21/2022 1745   GFRNONAA >89 03/18/2016 1445   GFRAA 111 05/26/2020 1601   GFRAA >89 03/18/2016 1445    Imaging:  Chest x-ray: Showing atelectasis versus infiltrate at left base, aspiration not excluded.  Subsegmental atelectasis of right base.  CT head: Showing no acute intracranial  abnormality.  Stable atrophy and chronic small vessel disease.  Remote lacunar infarcts in the left basal ganglia and left caudate.  EKG: personally reviewed my interpretation is Sinus tachycardia with rate of 102. With left axis noted. With prolong Qtc of 506.No acute ST segment changes noted. When compared to the previous EKGs, patient has had left axis in the past.   ASSESSMENT & PLAN:   Assessment & Plan by Problem: Principal Problem:   Acute metabolic encephalopathy   NABOR THOMANN is a 72 y.o. male with a past medical history of dementia, type 2 diabetes, hypertension, hyperlipidemia, prior stroke, epilepsy on Depakote, recent hospitalization for E. coli bacteremia presenting for altered mental status. Patient admitted for further evaluation management of acute metabolic encephalopathy.   #Acute Metabolic Encephalopathy Patient presents to the emergency department with altered mental status that has been declining for about a month now. Patient presents to the ED and is not responsive with GCS of 6. On my exam patient is not able to communicate and patient is only able to withdraw from pain. Patient did have equal and reactive pupils but not able to access extraocular motion. Initial imaging made any structural causes less likely. Thyroid etiology has been ruled out. Given patient lives at facility and in a controlled environment, drug induced less likely as well. Given afebrile and absent meningeal signs, less likely meningitis/encephalitis. Seizure less likely given history. Current theory is likely multifactorial in the setting of pneumonia and severe hypernatremia. Patient also has a past medical history of dementia which could also be progressing. With Na at 163 and with imaging finding, will need to treat for infection and lower Na. UTI possible with hx of retention and patient with foley that has not been changed. UA with leukocytes and >50 bacteria but high likelihood of contaminant.  Will defer further imaging for now but If patient's mental status does not return back to baseline, can obtain MRI.  -Hold centrally acting medications -Monitor Oxygenation status  -Redirect patient as needed  -Delirium precautions -Treat pneumonia and hypernatremia as below -Keep Patient NPO until he can pass a bedside swallow  -Follow blood cultures  -Follow urine cultures   #Hypovolemic Hypernatremia   Patient presented with significantly elevated Na at 165 seen on CMP. This could  be etiology of acute metabolic encephalopathy. On exam patient has decreased skin turgor and dry mucus membranes. Per charting patient has had decreased P.O. intake and has been getting lasix in the outpatient setting for concerns of pulmonary edema. Patient may have been over diuresed and without replenishing fluids, patient's Na has risen significantly. It was surprising patient's kidney function remains at baseline given that patient was very volume down. Will assume chronic hypernatremia and slowly start to correct Na. Did see patient's most recent ECHO on 08/09/22 which showed normal EF and Grade 1 diastolic dysfunction. Will monitor O2 saturation while replenishing fluids and monitor respiratory status to ensure patient does not become volume overloaded. Can also consider Diabetes insipidus but currently less likely.   -Total Fluid deficit: 8.8L  -D5 1/2NS infusion 200 cc/hr  -q4h Na checks  -Goal Na 155 within the next 24 hours, correcting by no more than 10  -Urine studies pending  -Monitor respiratory status and monitor for increase O2 requirement     #Acute Hypoxic Respiratory Failure #Aspiration Pneumonia  #Leukocytosis  Patient presented with concerns of altered mental status. Patient as been declining since last admission. Patient has been seen in the center he is living at and patient has had more cough and sputum production per charting. On my exam patient had very thick purulent sputum noted to his  oropharynx. Decreased breath sounds noted to bilateral lung bases. White count up to 14.6. Patient has been treated with ceftriaxone at his center. Strep antigen negative. Given AMS with GCS of 6, PCCM consulted to see if pt appropriate for intubation given concern for continued aspiration. They do not think intubation indicated at this time.  -Legionella antigen pending  -Start Zosyn 3.375g Q8H -Continue azithromycin 250 mg daily for 5 days -MRSA nares negative  -Monitor respiratory status  -Supplemental O2 to keep SPo2 greater than 92%  -Monitor white count -Monitor Fever curve     #Seizure disorder  Patient has a history of seizure disorder which patient was recently start on Depakote for. Currently not concerned about a seizure. Patient did have have EEG performed in hospital which showed diffused encephalopathy likely toxic-metabolic in nature. Will resume Home Depakote.  -Given patient is NPO, start valproate '250mg'$  IV q12H  #Alzheimer's Dementia  Patient has past medical history of dementia. Per charting, patient has Alzheimer's dementia that was documented by Dr. Jeannine Kitten at the family medicine center. At baseline per staff at the facility patient is alert and oriented x2 and is able to hold a conversation. Patient right now on my exam is A&Ox0.  Patient does get agitated at times and does take Seroquel for this. Will hold home Seroquel.  -Hold home Seroquel 75 mg nightly    #Benign Prostate Hyperplasia #Urinary Retention  Patient has a past medical history of BPH on finasteride 5 mg and Tamsulosin 0.8 mg daily. There has been some concerns that patient has had rentention issues and I wonder if these are related to BPH. Patient has had a foley in since last admission with most recent change of foley on 09/06/22 when patient pulled out the foley. Patient will likely need to see urology either inpatient or outpatient. Patient could also benefit from a foley change as chronic foley can increase  patient's risk of infection. From most recent urology note on 09/06/23, patient was to get a voiding trail after 3-5 days of placement on the 18th of December. Unsure if the voiding trial was done at facility, once mentation is  better here, patient can benefit from voiding trial. Will replace foley now.  -Monitor urine output   -Change foley -Hold home medications  -Patient can benefit from urology input, will attempt voiding trial once more alert  #Hypermagnesemia, resolved   Patient mag slightly elevated at 2.6 initially, and on repeat, mag at 2.4.  -Continue to monitor   #HTN Patient has a past medical history of HTN. Patient home medications includes Irbesartan 150 mg daily, carvedilol 6.25 mg BID. Pressures currently in the 140s. Will hold home meds for now.  -Home home medications -Monitor Blood pressure   #Hx of CVA  Patient has a past medical history of a CVA. Low concern for acute stroke currently. Will resume home aspirin rectally, but hold home atorvastatin 40 mg daily.   -Resume home aspirin 150 mg rectally  -Home home atorvastatin   #T2DM Past medical history of Diabetes. Most recent A1c 6.6 on 08/08/2022. Given patient is NPO at this time, will hold home insulin. One time dose of 8 units given. -Hold home lantus 12 units daily -Hold home metformin 1000 mg BID -Monitor CBGs   #Depression Patient has a past medical history of depression. Patient's home medication includes citalopram 40 mg daily. No acute concerns, will hold home medication.  -Hold home citalopram 40 mg daily    #Insomnia  Patient has a pat medical history of insomnia. Home medication includes melatonin 5 mg nightly. Will hold in the setting of altered mental status.  -Hold home melatonin 5 mg nightly   #Goals of care Per charting from provider at the facility, patient would benefit from a palliative care consult. Patient has been slowing declining. Patient does have a legal guardian in [place]  who can be of assistance. Patient is currently a full code. Per charting patient has been slowing declining, and it would be important to discuss goals of care moving forward.   Diet: NPO VTE: Enoxaparin IVF: D5,140cc/hr  Code: Full  Prior to Admission Living Arrangement: [facility]  Anticipated Discharge Location:  [facility]  Barriers to Discharge: Clinical Improvement   Dispo: Admit patient to Inpatient with expected length of stay greater than 2 midnights.  Signed: Leigh Aurora, DO Internal Medicine Resident PGY-1 Pager: [phone] 10/25/2022, 5:44 AM   On weekends or after 5pm please page on call intern or resident: First contact: [phone] If no answer in 15 minutes, please contact senior pager at [phone]

## 2022-10-24 NOTE — ED Provider Notes (Signed)
[facility] Provider Note   CSN: [id number] Arrival date & time: 10/23/2022  1524     History  Chief Complaint  Patient presents with   Altered Mental Status    Unsure of start time,     Charles Fox is a 72 y.o. male.  Charles Fox is a 72 year old male extensive past medical history PMH of dementia, T2DM, Htn, HLD, CVA, epilepsy on Depakote, recent hospitalization for E. coli bacteremia presenting to the emergency department for altered mental status.  Per EMS report patient lives at the [facility].  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  She states that she saw him today and the patient was borderline unresponsive only withdrawing from pain.  He therefore presents to the emergency department history is extremely limited.    Altered Mental Status      Home Medications Prior to Admission medications   Medication Sig Start Date End Date Taking? Authorizing Provider  aspirin EC 81 MG tablet Take 1 tablet (81 mg total) by mouth daily. Swallow whole. 10/14/20   [name], MD  aspirin EC 81 MG tablet Take 81 mg by mouth daily. Swallow whole.    [provider]  atorvastatin (LIPITOR) 40 MG tablet Take 1 tablet (40 mg total) by mouth daily. 10/14/20   [name], MD  atorvastatin (LIPITOR) 40 MG tablet Take 40 mg by mouth daily.    [provider]  carvedilol (COREG) 3.125 MG tablet Take 1 tablet (3.125 mg total) by mouth 2 (two) times daily with a meal. 09/24/21   Autry-Lott, Naaman Plummer, DO  carvedilol (COREG) 6.25 MG tablet Take 6.25 mg by mouth 2 (two) times daily with a meal.    [provider]  citalopram (CELEXA) 20 MG tablet Take 1 tablet (20 mg total) by mouth daily. 09/24/21   Autry-Lott, Naaman Plummer, DO  citalopram (CELEXA) 40 MG tablet Take 40 mg by mouth daily.    [provider]  divalproex (DEPAKOTE) 500 MG DR tablet Take 1 tablet (500 mg total) by mouth 2 (two) times daily. 09/11/22   Pattricia Boss, MD  finasteride (PROSCAR) 5 MG tablet Take 1 tablet (5 mg total) by mouth daily. 09/24/21   Autry-Lott, Naaman Plummer, DO  finasteride (PROSCAR) 5 MG tablet Take 5 mg by mouth daily.    [provider]  insulin glargine (LANTUS) 100 UNIT/ML Solostar Pen Inject 12 Units into the skin daily. 08/29/22   Bonnielee Haff, MD  ipratropium-albuterol (DUONEB) 0.5-2.5 (3) MG/3ML SOLN Take 3 mLs by nebulization every 4 (four) hours as needed. 08/29/22   Bonnielee Haff, MD  irbesartan (AVAPRO) 150 MG tablet Take 150 mg by mouth daily.    [provider]  lidocaine (LIDODERM) 5 % Place 1 patch onto the skin daily. Remove & Discard patch within 12 hours or as directed by MD 09/24/21   Autry-Lott, Naaman Plummer, DO  losartan (COZAAR) 50 MG tablet Take 1.5 tablets (75 mg total) by mouth at bedtime. 09/24/21   Autry-Lott, Naaman Plummer, DO  melatonin 5 MG TABS Take 1 tablet (5 mg total) by mouth at bedtime. 09/24/21   Autry-Lott, Naaman Plummer, DO  melatonin 5 MG TABS Take 5 mg by mouth at bedtime.    [provider]  metFORMIN (GLUCOPHAGE-XR) 500 MG 24 hr tablet Take 1 tablet (500 mg  total) by mouth 2 (two) times daily with a meal. 05/05/21   [name], MD  metFORMIN (GLUCOPHAGE-XR) 500 MG 24 hr tablet Take 1,000 mg by mouth 2 (two) times daily.    [provider]  nitroGLYCERIN (NITROSTAT) 0.4 MG SL tablet Place 1 tablet (0.4 mg total) under the tongue every 5 (five) minutes as needed for chest pain. 09/24/21   Autry-Lott, Naaman Plummer, DO  nitroGLYCERIN (NITROSTAT) 0.4 MG SL tablet Place 0.4 mg under the tongue every 5 (five) minutes as needed for chest pain.    [provider]  Nutritional Supplements (FEEDING SUPPLEMENT, NEPRO CARB STEADY,) LIQD Take 237 mLs by mouth 2 (two) times daily between meals. 08/29/22   Bonnielee Haff, MD  olopatadine (PATANOL) 0.1 %  ophthalmic solution Place 1 drop into both eyes 2 (two) times daily as needed (dry eyes). 09/24/21   Autry-Lott, Naaman Plummer, DO  olopatadine (PATANOL) 0.1 % ophthalmic solution Place 1 drop into both eyes 2 (two) times daily as needed for allergies (dry eyes).    [provider]  QUEtiapine (SEROQUEL) 25 MG tablet Take 3 tablets (75 mg total) by mouth at bedtime. 09/24/21   Autry-Lott, Naaman Plummer, DO  QUEtiapine (SEROQUEL) 25 MG tablet Take 75 mg by mouth at bedtime.    [provider]  saccharomyces boulardii (FLORASTOR) 250 MG capsule Take 1 capsule (250 mg total) by mouth 2 (two) times daily. 08/29/22   Bonnielee Haff, MD  senna (SENOKOT) 8.6 MG TABS tablet Take 1 tablet (8.6 mg total) by mouth daily. 09/24/21   Autry-Lott, Naaman Plummer, DO  tamsulosin (FLOMAX) 0.4 MG CAPS capsule Take 2 capsules (0.8 mg total) by mouth daily. 09/24/21   Autry-Lott, Naaman Plummer, DO  tamsulosin (FLOMAX) 0.4 MG CAPS capsule Take 0.8 mg by mouth daily after breakfast.    [provider]      Allergies    Patient has no known allergies.    Review of Systems   Review of Systems  Physical Exam Updated Vital Signs BP (!) 155/100   Pulse (!) 109   Temp (!) 97.4 F (36.3 C) (Oral)   Resp 20   SpO2 98%  Physical Exam Constitutional:      Appearance: He is underweight. He is ill-appearing.  HENT:     Head: Atraumatic.     Mouth/Throat:     Mouth: Mucous membranes are dry.  Eyes:     Pupils: Pupils are equal, round, and reactive to light.  Cardiovascular:     Rate and Rhythm: Tachycardia present.     Pulses: Normal pulses.  Pulmonary:     Breath sounds: Rhonchi present.  Abdominal:     General: Abdomen is flat. There is no distension.     Tenderness: There is no abdominal tenderness.  Genitourinary:    Comments: Foley in place  Skin:    General: Skin is warm and dry.     Capillary Refill: Capillary refill takes less than 2 seconds.  Neurological:     Mental Status: He is lethargic and  disoriented.     GCS: GCS eye subscore is 1. GCS verbal subscore is 1. GCS motor subscore is 4.     ED Results / Procedures / Treatments   Labs (all labs ordered are listed, but only abnormal results are displayed) Labs Reviewed  CBC WITH DIFFERENTIAL/PLATELET - Abnormal; Notable for the following components:      Result Value   WBC 14.6 (*)    RBC 3.75 (*)    Hemoglobin 11.2 (*)  HCT 38.2 (*)    MCV 101.9 (*)    MCHC 29.3 (*)    RDW 16.9 (*)    Neutro Abs 11.5 (*)    Abs Immature Granulocytes 0.18 (*)    All other components within normal limits  COMPREHENSIVE METABOLIC PANEL - Abnormal; Notable for the following components:   Sodium 165 (*)    Chloride 125 (*)    Glucose, Bld 300 (*)    BUN 41 (*)    Albumin 2.0 (*)    All other components within normal limits  URINALYSIS, ROUTINE W REFLEX MICROSCOPIC - Abnormal; Notable for the following components:   APPearance CLOUDY (*)    Glucose, UA 50 (*)    Hgb urine dipstick MODERATE (*)    Protein, ur 100 (*)    Leukocytes,Ua LARGE (*)    Bacteria, UA FEW (*)    All other components within normal limits  MAGNESIUM - Abnormal; Notable for the following components:   Magnesium 2.6 (*)    All other components within normal limits  PHOSPHORUS - Abnormal; Notable for the following components:   Phosphorus 2.1 (*)    All other components within normal limits  CBG MONITORING, ED - Abnormal; Notable for the following components:   Glucose-Capillary 235 (*)    All other components within normal limits  I-STAT CHEM 8, ED - Abnormal; Notable for the following components:   Sodium 170 (*)    Chloride 125 (*)    BUN 41 (*)    Glucose, Bld 298 (*)    TCO2 33 (*)    Hemoglobin 11.6 (*)    HCT 34.0 (*)    All other components within normal limits  I-STAT VENOUS BLOOD GAS, ED - Abnormal; Notable for the following components:   pCO2, Ven 62.2 (*)    pO2, Ven 158 (*)    Bicarbonate 35.2 (*)    TCO2 37 (*)    Acid-Base Excess 8.0  (*)    Sodium 170 (*)    HCT 34.0 (*)    Hemoglobin 11.6 (*)    All other components within normal limits  CBG MONITORING, ED - Abnormal; Notable for the following components:   Glucose-Capillary 292 (*)    All other components within normal limits  RESP PANEL BY RT-PCR (RSV, FLU A&B, COVID)  RVPGX2  CULTURE, BLOOD (ROUTINE X 2)  CULTURE, BLOOD (ROUTINE X 2)  MRSA NEXT GEN BY PCR, NASAL  LACTIC ACID, PLASMA  LACTIC ACID, PLASMA  TSH  T4, FREE  SODIUM  SODIUM  SODIUM  SODIUM  SODIUM  SODIUM  SODIUM  STREP PNEUMONIAE URINARY ANTIGEN  LEGIONELLA PNEUMOPHILA SEROGP 1 UR AG    EKG EKG Interpretation  Date/Time:  Monday October 24 2022 15:33:01 EST Ventricular Rate:  102 PR Interval:  136 QRS Duration: 78 QT Interval:  388 QTC Calculation: 506 R Axis:   -49 Text Interpretation: Sinus tachycardia Left anterior fascicular block Consider anterior infarct Nonspecific T abnormalities, lateral leads Prolonged QT interval Abnormal ECG Confirmed by Carmin Muskrat [phone]) on 11/13/2022 3:43:29 PM  Radiology CT Head Wo Contrast  Result Date: 11/16/2022 CLINICAL DATA:  Mental status change, unknown cause EXAM: CT HEAD WITHOUT CONTRAST TECHNIQUE: Contiguous axial images were obtained from the base of the skull through the vertex without intravenous contrast. RADIATION DOSE REDUCTION: This exam was performed according to the departmental dose-optimization program which includes automated exposure control, adjustment of the mA and/or kV according to patient size and/or use of iterative reconstruction  technique. COMPARISON:  Head CT 09/11/2022 FINDINGS: Brain: No intracranial hemorrhage, mass effect, or midline shift. Stable degree of generalized atrophy. No hydrocephalus. The basilar cisterns are patent. Stable periventricular chronic small vessel ischemia. Again seen remote lacunar infarcts in the left basal ganglia and left caudate. No evidence of territorial infarct or acute ischemia. No  extra-axial or intracranial fluid collection. Vascular: Atherosclerosis of skullbase vasculature without hyperdense vessel or abnormal calcification. Skull: No fracture or focal lesion. Sinuses/Orbits: Paranasal sinuses and mastoid air cells are clear. The visualized orbits are unremarkable. Other: Soft tissue thickening of the left occipital scalp was present on prior exam and may represent scarring. IMPRESSION: 1. No acute intracranial abnormality. 2. Stable atrophy and chronic small vessel ischemia. Remote lacunar infarcts in the left basal ganglia and left caudate. Electronically Signed   By: Keith Rake M.D.   On: 11/10/2022 18:04   DG Chest Portable 1 View  Result Date: 10/30/2022 CLINICAL DATA:  Aspiration EXAM: PORTABLE CHEST 1 VIEW COMPARISON:  Portable exam 1558 hours compared to 08/14/2022 FINDINGS: Upper normal heart size. Mediastinal contours and pulmonary vascularity normal. Mild RIGHT basilar atelectasis. Atelectasis versus infiltrate LEFT base, aspiration not excluded. Upper lungs clear. No pleural effusion or pneumothorax. IMPRESSION: Atelectasis versus infiltrate at LEFT base, aspiration not excluded. Subsegmental atelectasis RIGHT base. Electronically Signed   By: Lavonia Dana M.D.   On: 11/01/2022 16:12    Procedures Procedures    Medications Ordered in ED Medications  dextrose 5 % solution ( Intravenous New Bag/Given 11/12/2022 2135)  enoxaparin (LOVENOX) injection 40 mg (has no administration in time range)  valproate (DEPACON) 250 mg in dextrose 5 % 50 mL IVPB (has no administration in time range)  azithromycin (ZITHROMAX) 250 mg in dextrose 5 % 125 mL IVPB (has no administration in time range)  aspirin suppository 150 mg (has no administration in time range)  insulin aspart (novoLOG) injection 8 Units (has no administration in time range)  piperacillin-tazobactam (ZOSYN) IVPB 3.375 g (has no administration in time range)    Followed by  piperacillin-tazobactam (ZOSYN) IVPB  3.375 g (has no administration in time range)  azithromycin (ZITHROMAX) 500 mg in sodium chloride 0.9 % 250 mL IVPB (0 mg Intravenous Stopped 11/09/2022 2129)  Ampicillin-Sulbactam (UNASYN) 3 g in sodium chloride 0.9 % 100 mL IVPB (0 g Intravenous Stopped 11/08/2022 2046)    ED Course/ Medical Decision Making/ A&P                             Medical Decision Making Charles Fox is a 72 year old male past medical history as document above presenting to the emergency department for altered mental status.  My initial assessment patient is GCS 6 slightly tachycardic heart rate 102 temperature 98.6 blood pressure 132/80, 21 respirations per minute satting 94% on 4L.   Per review of patient's MAR from his skilled nursing facility patient has had a gradual decline in mental status there is a note from 2/1 states the patient has a 2 L baseline O2 requirement and had been 8 aspiration risk and concerns that they had started him on ceftriaxone daily.  I personally called Creig Hines patient's legal guardian and had a long conversation patient's decline started with his hospitalization in November for pneumonia at which time he was discharged to the [facility] health center.  Patient at baseline is conversational and able to ambulate on a limited basis.  She last saw him on January 24  and he was at his baseline but according to her as well as the notes that are documented in his skilled nursing facility chart he has had increasing somnolence difficulty swallowing and frequent aspiration.  Given his baseline as well as his presentation I am concerned that he has had gradual mental status decline with recurrent aspiration.  We are working him up from an infection standpoint with CBC, CMP, lactic acid, blood cultures x 2.  I will also obtain a CT head to make sure I am not missing any intracranial causes of his worsening mental status.  Per the patient's legal guardian patient is still full code.  Patient's  labs are concerning for an elevated white blood cell count with a neutrophilic shift patient's hemoglobin and platelets are at baseline.  Patient has no evidence of acute COVID flu or RSV patient's chest x-ray does show concerning findings for aspiration and given previous documentation of patient's skilled nursing facility I am concerned for recurrent aspiration therefore he was treated with Unasyn for aspiration pneumonia.  Patient's urinalysis was drawn from his indwelling Foley which shows few bacteria large leukocytes no evidence of nitrites and greater than 50 WBCs this is most likely in the setting of a colonized Foley catheter.  Patient's Chem-8 showed elevated potassium however I waited for CMP to return which showed that patient has an elevated sodium to 165 with a chloride of 125 patient's renal function is grossly within normal limits for him magnesium is elevated at 2.6 and phosphorus is depressed at 2.1.  Given patient's history of recurrent aspiration and do not believe that he has been tolerating p.o. intake at his skilled nursing facility and that he is continually given IV fluids with intermittent diuresis I believe that his hyponatremia is most likely in the setting of acute hypovolemia given his physical exam showing an extremely dry patient.  Calculated patient's free water deficit of 10.5 L and started him in D5W 140 mL an hour.  I believe that patient's hyponatremia is a contributing factor to his grossly altered mental status.  Patient did not exhibit any seizure-like activity in the emergency department patient CT head shows no evidence of acute pathology or intracranial bleed.  We attempted to admit the patient to the medicine for but given his low GCS we opted to consult critical care who came and evaluated the patient at bedside please see their note for further details however ultimately given the fact that patient has had a prolonged period of time at this altered mental status we are  not concerned for an acute decompensation and therefore we will withhold intubation at this time.  Patient was admitted to the hospitalist teaching service for further management.     Amount and/or Complexity of Data Reviewed Labs: ordered. Radiology: ordered.  Risk Prescription drug management. Decision regarding hospitalization.           Final Clinical Impression(s) / ED Diagnoses Final diagnoses:  Hypernatremia  Aspiration pneumonia of both lower lobes due to gastric secretions [facility])    Rx / DC Orders ED Discharge Orders     None         Donzetta Matters, MD 10/25/22 Dyann Kief    Carmin Muskrat, MD 10/25/22 [phone]

## 2022-10-24 NOTE — Hospital Course (Addendum)
Charles Fox is a 72 yo male with severe, terminal dementia who initially presented from a skilled nursing facility with declining functional status and altered mental status in the setting of hypernatremia and aspiration pneumonia. The patient was treated with IV antibiotics for 5 days and was also given IV fluids, with an improvement in his sodium status. Despite these measures, the patient clinically did not improve and remained altered. He was not interactive and did not follow any instructions. It was not safe for the patient to take any oral food or liquid, thus a CorTrak was placed for nutrition, however, despite this, he did not improve despite full medical support. Many discussions were held with the patient's legal guardian, and the patient was ultimately changed to DNR. We recommended a palliative care consult, and at that point, transitioned the patient to comfort care in his end of life, to allow for a natural death. The patient was treated with PRN morphine and ativan and was kept comfortable. He died on *** at Dec 13, 2053

## 2022-10-24 NOTE — ED Notes (Signed)
Arrives from [facility] with AMS. CNA reported unable to take PO meds this AM and was not responsive enough for meds. Last she saw him was Friday. BP 160/80, HR 100, RR 20, 100% RA, Foley placed, CBG 394

## 2022-10-24 NOTE — ED Notes (Signed)
Numerous unsuccessful attempts made to establish IV access.

## 2022-10-24 NOTE — ED Notes (Addendum)
Sodium 165 per lab. RN notified.

## 2022-10-25 ENCOUNTER — Other Ambulatory Visit: Payer: Self-pay

## 2022-10-25 ENCOUNTER — Inpatient Hospital Stay (HOSPITAL_COMMUNITY): Payer: Medicare Other

## 2022-10-25 DIAGNOSIS — E440 Moderate protein-calorie malnutrition: Secondary | ICD-10-CM | POA: Diagnosis present

## 2022-10-25 DIAGNOSIS — R4182 Altered mental status, unspecified: Secondary | ICD-10-CM

## 2022-10-25 DIAGNOSIS — E870 Hyperosmolality and hypernatremia: Principal | ICD-10-CM | POA: Diagnosis present

## 2022-10-25 LAB — CBG MONITORING, ED
Glucose-Capillary: 288 mg/dL — ABNORMAL HIGH (ref 70–99)
Glucose-Capillary: 299 mg/dL — ABNORMAL HIGH (ref 70–99)
Glucose-Capillary: 318 mg/dL — ABNORMAL HIGH (ref 70–99)

## 2022-10-25 LAB — BASIC METABOLIC PANEL
Anion gap: 9 (ref 5–15)
Anion gap: 10 (ref 5–15)
BUN: 31 mg/dL — ABNORMAL HIGH (ref 8–23)
BUN: 24 mg/dL — ABNORMAL HIGH (ref 8–23)
CO2: 30 mmol/L (ref 22–32)
CO2: 29 mmol/L (ref 22–32)
Calcium: 8.1 mg/dL — ABNORMAL LOW (ref 8.9–10.3)
Calcium: 8.5 mg/dL — ABNORMAL LOW (ref 8.9–10.3)
Chloride: 121 mmol/L — ABNORMAL HIGH (ref 98–111)
Chloride: 122 mmol/L — ABNORMAL HIGH (ref 98–111)
Creatinine, Ser: 1.09 mg/dL (ref 0.61–1.24)
Creatinine, Ser: 0.91 mg/dL (ref 0.61–1.24)
GFR, Estimated: >60 mL/min (ref >60)
GFR, Estimated: >60 mL/min (ref >60)
Glucose, Bld: 381 mg/dL — ABNORMAL HIGH (ref 70–99)
Glucose, Bld: 230 mg/dL — ABNORMAL HIGH (ref 70–99)
Potassium: 3.8 mmol/L (ref 3.5–5.1)
Potassium: 3.4 mmol/L — ABNORMAL LOW (ref 3.5–5.1)
Sodium: 160 mmol/L — ABNORMAL HIGH (ref 135–145)
Sodium: 161 mmol/L (ref 135–145)

## 2022-10-25 LAB — OSMOLALITY, URINE: Osmolality, Ur: 575 mOsm/kg (ref 300–900)

## 2022-10-25 LAB — MRSA NEXT GEN BY PCR, NASAL: MRSA by PCR Next Gen: NOT DETECTED

## 2022-10-25 LAB — SODIUM
Sodium: 162 mmol/L (ref 135–145)
Sodium: 162 mmol/L (ref 135–145)
Sodium: 163 mmol/L (ref 135–145)

## 2022-10-25 LAB — OSMOLALITY: Osmolality: 370 mOsm/kg (ref 275–295)

## 2022-10-25 LAB — GLUCOSE, CAPILLARY
Glucose-Capillary: 215 mg/dL — ABNORMAL HIGH (ref 70–99)
Glucose-Capillary: 190 mg/dL — ABNORMAL HIGH (ref 70–99)

## 2022-10-25 LAB — MAGNESIUM: Magnesium: 2.4 mg/dL (ref 1.7–2.4)

## 2022-10-25 LAB — NA AND K (SODIUM & POTASSIUM), RAND UR
Potassium Urine: 36 mmol/L
Sodium, Ur: 26 mmol/L

## 2022-10-25 LAB — STREP PNEUMONIAE URINARY ANTIGEN: Strep Pneumo Urinary Antigen: NEGATIVE

## 2022-10-25 MED ORDER — LACTATED RINGERS IV BOLUS
2000.0000 mL | Freq: Once | INTRAVENOUS | Status: AC
  Administered 2022-10-25: 2000 mL via INTRAVENOUS

## 2022-10-25 MED ORDER — PIPERACILLIN-TAZOBACTAM 3.375 G IVPB 30 MIN
3.3750 g | Freq: Once | INTRAVENOUS | Status: AC
  Administered 2022-10-25: 3.375 g via INTRAVENOUS
  Filled 2022-10-25: qty 50

## 2022-10-25 MED ORDER — DEXTROSE-NACL 5-0.45 % IV SOLN: INTRAVENOUS | Status: AC

## 2022-10-25 MED ORDER — INSULIN ASPART 100 UNIT/ML IJ SOLN
0.0000 [IU] | INTRAMUSCULAR | Status: DC
  Administered 2022-10-25: 2 [IU] via SUBCUTANEOUS
  Administered 2022-10-25: 3 [IU] via SUBCUTANEOUS
  Administered 2022-10-26: 1 [IU] via SUBCUTANEOUS
  Administered 2022-10-26: 2 [IU] via SUBCUTANEOUS
  Administered 2022-10-26: 3 [IU] via SUBCUTANEOUS
  Administered 2022-10-26: 2 [IU] via SUBCUTANEOUS
  Administered 2022-10-26 (×2): 1 [IU] via SUBCUTANEOUS
  Administered 2022-10-27 (×2): 2 [IU] via SUBCUTANEOUS
  Administered 2022-10-27: 1 [IU] via SUBCUTANEOUS
  Administered 2022-10-27: 2 [IU] via SUBCUTANEOUS
  Administered 2022-10-27 (×2): 3 [IU] via SUBCUTANEOUS
  Administered 2022-10-28: 1 [IU] via SUBCUTANEOUS
  Administered 2022-10-28 – 2022-10-29 (×9): 2 [IU] via SUBCUTANEOUS
  Administered 2022-10-29: 3 [IU] via SUBCUTANEOUS
  Administered 2022-10-30: 2 [IU] via SUBCUTANEOUS
  Administered 2022-10-30: 3 [IU] via SUBCUTANEOUS
  Administered 2022-10-30: 2 [IU] via SUBCUTANEOUS
  Administered 2022-10-30: 3 [IU] via SUBCUTANEOUS
  Administered 2022-10-30 (×2): 2 [IU] via SUBCUTANEOUS
  Administered 2022-10-31: 3 [IU] via SUBCUTANEOUS
  Administered 2022-10-31: 2 [IU] via SUBCUTANEOUS
  Administered 2022-10-31: 3 [IU] via SUBCUTANEOUS
  Administered 2022-10-31: 1 [IU] via SUBCUTANEOUS
  Administered 2022-10-31 (×2): 3 [IU] via SUBCUTANEOUS
  Administered 2022-11-01 – 2022-11-02 (×6): 2 [IU] via SUBCUTANEOUS
  Administered 2022-11-02: 3 [IU] via SUBCUTANEOUS
  Administered 2022-11-02: 2 [IU] via SUBCUTANEOUS

## 2022-10-25 MED ORDER — LACTATED RINGERS IV BOLUS: 1000.0000 mL | Freq: Once | INTRAVENOUS | Status: DC

## 2022-10-25 MED ORDER — ASPIRIN 300 MG RE SUPP
150.0000 mg | Freq: Every day | RECTAL | Status: DC
  Administered 2022-10-25 – 2022-11-01 (×8): 150 mg via RECTAL
  Filled 2022-10-25 (×9): qty 1

## 2022-10-25 MED ORDER — CHLORHEXIDINE GLUCONATE CLOTH 2 % EX PADS
6.0000 | MEDICATED_PAD | Freq: Every day | CUTANEOUS | Status: DC
  Administered 2022-10-25 – 2022-11-04 (×12): 6 via TOPICAL

## 2022-10-25 MED ORDER — LACTATED RINGERS IV BOLUS
1000.0000 mL | Freq: Once | INTRAVENOUS | Status: AC
  Administered 2022-10-25: 1000 mL via INTRAVENOUS

## 2022-10-25 MED ORDER — PIPERACILLIN-TAZOBACTAM 3.375 G IVPB
3.3750 g | Freq: Three times a day (TID) | INTRAVENOUS | Status: DC
  Administered 2022-10-25 (×2): 3.375 g via INTRAVENOUS
  Filled 2022-10-25 (×2): qty 50

## 2022-10-25 MED ORDER — SODIUM CHLORIDE 0.9 % IV SOLN
3.0000 g | Freq: Four times a day (QID) | INTRAVENOUS | Status: DC
  Administered 2022-10-26 – 2022-10-29 (×15): 3 g via INTRAVENOUS
  Filled 2022-10-25 (×14): qty 8

## 2022-10-25 MED ORDER — INSULIN ASPART 100 UNIT/ML IJ SOLN
8.0000 [IU] | Freq: Once | INTRAMUSCULAR | Status: AC
  Administered 2022-10-25: 8 [IU] via SUBCUTANEOUS

## 2022-10-25 NOTE — ED Notes (Signed)
Wife [name] [phone] would like an update asap

## 2022-10-25 NOTE — Progress Notes (Signed)
Pharmacy Antibiotic Note  Charles Fox is a 72 y.o. male admitted on 11/03/2022 with pneumonia.  Pharmacy has been consulted for zosyn dosing.  Pt presented from SNF with AMS. Hx of recurrent aspiration. Zosyn ordered for PNA.  Scr 1  Plan: Zosyn 3.375g IV x1 over 30 mins then 3.375g IV q8 over 4 hr     Temp (24hrs), Avg:97.9 F (36.6 C), Min:97.4 F (36.3 C), Max:98.6 F (37 C)  Recent Labs  Lab 10/30/2022 1745 10/20/2022 1826 10/27/2022 2144  WBC 14.6*  --   --   CREATININE 1.08 1.20  --   LATICACIDVEN 1.8  --  1.7    CrCl cannot be calculated (Unknown ideal weight.).    No Known Allergies  Antimicrobials this admission: 2/6 zosyn>>  Dose adjustments this admission:   Microbiology results: 2/5 blood>> MRSA>> Flu/covid neg  Onnie Boer, PharmD, BCIDP, AAHIVP, CPP Infectious Disease Pharmacist 10/25/2022 12:04 AM

## 2022-10-25 NOTE — Inpatient Diabetes Management (Signed)
Inpatient Diabetes Program Recommendations  AACE/ADA: New Consensus Statement on Inpatient Glycemic Control (2015)  Target Ranges:  Prepandial:   less than 140 mg/dL      Peak postprandial:   less than 180 mg/dL (1-2 hours)      Critically ill patients:  140 - 180 mg/dL   Lab Results  Component Value Date   GLUCAP 318 (H) 10/25/2022   HGBA1C 6.6 (H) 08/08/2022    Review of Glycemic Control  Latest Reference Range & Units 10/26/2022 15:35 10/27/2022 23:41 10/25/22 00:31 10/25/22 08:56  Glucose-Capillary 70 - 99 mg/dL 235 (H) 292 (H) 288 (H) 318 (H)   Diabetes history: DM 2, From SNF, dementia Outpatient Diabetes medications: Metformin 1000 mg bid Current orders for Inpatient glycemic control:  None  A1c 6.6% on 11/20  Inpatient Diabetes Program Recommendations:    -  Start Semglee 5 units -  Start Novolog 0-9 units tid + hs  Thanks,  Tama Headings RN, MSN, BC-ADM Inpatient Diabetes Coordinator Team Pager [phone] (8a-5p)

## 2022-10-25 NOTE — Procedures (Signed)
Patient Name: Charles Fox  MRN: [id number]  Epilepsy Attending: Lora Havens  Referring Physician/Provider: Idamae Schuller, MD  Date: 10/25/2022 Duration: 26.45 mins  Patient history: 72yo M with ams. EEG to evaluate for seizure   Level of alertness: lethargic   AEDs during EEG study: None   Technical aspects: This EEG study was done with scalp electrodes positioned according to the 10-20 International system of electrode placement. Electrical activity was reviewed with band pass filter of 1-'70Hz'$ , sensitivity of 7 uV/mm, display speed of 63m/sec with a '60Hz'$  notched filter applied as appropriate. EEG data were recorded continuously and digitally stored.  Video monitoring was available and reviewed as appropriate.   Description: No clear posterior dominant rhythm was seen.  EEG showed continuous generalized predominantly 6 to 7 Hz theta slowing admixed with 2-'3Hz'$  delta slowing.  Generalized periodic discharges with triphasic morphology at 1 Hz were also noted intermittently, more prominent when awake/stimulated. Photic driving was not seen during photic stimulation. Hyperventilation was not performed.      ABNORMALITY - Periodic discharges with triphasic morphology, generalized ( GPDs) - Continuous slow, generalized   IMPRESSION: This study showed generalized periodic discharges with triphasic morphology at 1 Hz which can be on the ictal-interictal continuum. However, the morphology, frequency and reactivity to stimulation is more likely suggestive of toxic-metabolic causes.  Additionally there is moderate diffuse encephalopathy, nonspecific etiology.  No seizures were seen during the study.   [name] OBarbra Sarks

## 2022-10-25 NOTE — ED Notes (Signed)
ED TO INPATIENT HANDOFF REPORT  ED Nurse Name and Phone #: [phone]  S Name/Age/Gender Charles Fox 72 y.o. male Room/Bed: 006C/006C  Code Status   Code Status: Full Code  Home/SNF/Other Skilled nursing facility Patient oriented to: self. When you call his name he does not answer  Is this baseline? No   Triage Complete: Triage complete  Chief Complaint Acute metabolic encephalopathy [E52.77]  Triage Note No notes on file   Allergies No Known Allergies  Level of Care/Admitting Diagnosis ED Disposition     ED Disposition  Admit   Condition  --   [facility]: [place] [[id number]]  Level of Care: Progressive [102]  Admit to Progressive based on following criteria: NEUROLOGICAL AND NEUROSURGICAL complex patients with significant risk of instability, who do not meet ICU criteria, yet require close observation or frequent assessment (< / = every 2 - 4 hours) with medical / nursing intervention.  May admit patient to [facility] or [facility] if equivalent level of care is available:: Yes  Covid Evaluation: Confirmed COVID Negative  Diagnosis: Acute metabolic encephalopathy [8242353]  Admitting Physician: Axel Filler [[id number]]  Attending Physician: Axel Filler [[id number]]  Certification:: I certify this patient will need inpatient services for at least 2 midnights  Estimated Length of Stay: 3          B Medical/Surgery History Past Medical History:  Diagnosis Date   Cataract    Mixed form OU   Chest pain 02/26/2021   Diabetes mellitus without complication ([place])    Diabetic retinopathy ([place])    PDR OU   Hypertension    Hypertensive retinopathy    OU   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[place])    Brain MRI  03/2021: small remote right cerebellar lacunar infarct   Pain, dental 11/20/2019   Skin lesions, generalized 05/09/2018   Tendinopathy of left rotator cuff 04/29/2019   Past Surgical History:  Procedure Laterality Date    BACK SURGERY     FINGER SURGERY       A IV Location/Drains/Wounds Patient Lines/Drains/Airways Status     Active Line/Drains/Airways     Name Placement date Placement time Site Days   Peripheral IV 11/11/2022 20 G 1.88" Anterior;Left Forearm 10/27/2022  1742  Forearm  1   Peripheral IV 10/25/22 18 G Anterior;Right;Upper Arm 10/25/22  --  Arm  less than 1   Urethral Catheter [place] RN Double-lumen 16 Fr. 10/25/22  1950  Double-lumen  less than 1   Pressure Injury 08/20/22 Buttocks Medial Stage 2 -  Partial thickness loss of dermis presenting as a shallow open injury with a red, pink wound bed without slough. 08/20/22  0800  -- 66   Wound / Incision (Open or Dehisced) 08/08/22 Laceration Lip Upper 08/08/22  1530  Lip  78   Wound / Incision (Open or Dehisced) 08/16/22 Skin tear Buttocks Left 08/16/22  2130  Buttocks  70            Intake/Output Last 24 hours  Intake/Output Summary (Last 24 hours) at 10/25/2022 1230 Last data filed at 10/25/2022 9326 Gross per 24 hour  Intake 1012.74 ml  Output 475 ml  Net 537.74 ml    Labs/Imaging Results for orders placed or performed during the hospital encounter of 11/14/2022 (from the past 48 hour(s))  CBG monitoring, ED     Status: Abnormal   Collection Time: 11/03/2022  3:35 PM  Result Value Ref Range   Glucose-Capillary 235 (H) 70 -  99 mg/dL    Comment: Glucose reference range applies only to samples taken after fasting for at least 8 hours.  Resp panel by RT-PCR (RSV, Flu A&B, Covid) Anterior Nasal Swab     Status: None   Collection Time: 10/30/2022  4:14 PM   Specimen: Anterior Nasal Swab  Result Value Ref Range   SARS Coronavirus 2 by RT PCR NEGATIVE NEGATIVE   Influenza A by PCR NEGATIVE NEGATIVE   Influenza B by PCR NEGATIVE NEGATIVE    Comment: (NOTE) The Xpert Xpress SARS-CoV-2/FLU/RSV plus assay is intended as an aid in the diagnosis of influenza from Nasopharyngeal swab specimens and should not be used as a sole basis for  treatment. Nasal washings and aspirates are unacceptable for Xpert Xpress SARS-CoV-2/FLU/RSV testing.  Fact Sheet for Patients: EntrepreneurPulse.com.au  Fact Sheet for Healthcare Providers: IncredibleEmployment.be  This test is not yet approved or cleared by the [place] FDA and has been authorized for detection and/or diagnosis of SARS-CoV-2 by FDA under an Emergency Use Authorization (EUA). This EUA will remain in effect (meaning this test can be used) for the duration of the COVID-19 declaration under Section 564(b)(1) of the Act, 21 U.S.C. section 360bbb-3(b)(1), unless the authorization is terminated or revoked.     Resp Syncytial Virus by PCR NEGATIVE NEGATIVE    Comment: (NOTE) Fact Sheet for Patients: EntrepreneurPulse.com.au  Fact Sheet for Healthcare Providers: IncredibleEmployment.be  This test is not yet approved or cleared by the [place] FDA and has been authorized for detection and/or diagnosis of SARS-CoV-2 by FDA under an Emergency Use Authorization (EUA). This EUA will remain in effect (meaning this test can be used) for the duration of the COVID-19 declaration under Section 564(b)(1) of the Act, 21 U.S.C. section 360bbb-3(b)(1), unless the authorization is terminated or revoked.  Performed at [facility], [place] [address] [place], [place] [address]   CBC with Differential     Status: Abnormal   Collection Time: 11/08/2022  5:45 PM  Result Value Ref Range   WBC 14.6 (H) 4.0 - 10.5 K/uL   RBC 3.75 (L) 4.22 - 5.81 MIL/uL   Hemoglobin 11.2 (L) 13.0 - 17.0 g/dL   HCT 38.2 (L) 39.0 - 52.0 %   MCV 101.9 (H) 80.0 - 100.0 fL   MCH 29.9 26.0 - 34.0 pg   MCHC 29.3 (L) 30.0 - 36.0 g/dL   RDW 16.9 (H) 11.5 - 15.5 %   Platelets 354 150 - 400 K/uL   nRBC 0.0 0.0 - 0.2 %   Neutrophils Relative % 78 %   Neutro Abs 11.5 (H) 1.7 - 7.7 K/uL   Lymphocytes Relative 14 %   Lymphs Abs  2.0 0.7 - 4.0 K/uL   Monocytes Relative 6 %   Monocytes Absolute 0.9 0.1 - 1.0 K/uL   Eosinophils Relative 1 %   Eosinophils Absolute 0.1 0.0 - 0.5 K/uL   Basophils Relative 0 %   Basophils Absolute 0.0 0.0 - 0.1 K/uL   Immature Granulocytes 1 %   Abs Immature Granulocytes 0.18 (H) 0.00 - 0.07 K/uL    Comment: Performed at [facility] [address] [place], [place] [address]  Comprehensive metabolic panel     Status: Abnormal   Collection Time: 11/07/2022  5:45 PM  Result Value Ref Range   Sodium 165 (HH) 135 - 145 mmol/L    Comment: CRITICAL RESULT CALLED TO, READ BACK BY AND VERIFIED WITH ALEXIA RN 10/20/2022 '@1930'$  BY J. WHITE  Potassium 3.9 3.5 - 5.1 mmol/L   Chloride 125 (H) 98 - 111 mmol/L   CO2 32 22 - 32 mmol/L   Glucose, Bld 300 (H) 70 - 99 mg/dL    Comment: Glucose reference range applies only to samples taken after fasting for at least 8 hours.   BUN 41 (H) 8 - 23 mg/dL   Creatinine, Ser 1.08 0.61 - 1.24 mg/dL   Calcium 8.9 8.9 - 10.3 mg/dL   Total Protein 8.1 6.5 - 8.1 g/dL   Albumin 2.0 (L) 3.5 - 5.0 g/dL   AST 34 15 - 41 U/L   ALT 21 0 - 44 U/L   Alkaline Phosphatase 76 38 - 126 U/L   Total Bilirubin 0.3 0.3 - 1.2 mg/dL   GFR, Estimated >60 >60 mL/min    Comment: (NOTE) Calculated using the CKD-EPI Creatinine Equation (2021)    Anion gap 8 5 - 15    Comment: Performed at [facility] [address] [place], [place] [address]  Lactic acid, plasma     Status: None   Collection Time: 11/08/2022  5:45 PM  Result Value Ref Range   Lactic Acid, Venous 1.8 0.5 - 1.9 mmol/L    Comment: Performed at [facility] [address] [place], [place] [address]  TSH     Status: None   Collection Time: 10/31/2022  5:45 PM  Result Value Ref Range   TSH 2.286 0.350 - 4.500 uIU/mL    Comment: Performed by a 3rd Generation assay with a functional sensitivity of <=0.01 uIU/mL. Performed at [facility], [place] [address] [place], [place] [address]   T4, free      Status: None   Collection Time: 11/09/2022  5:45 PM  Result Value Ref Range   Free T4 1.01 0.61 - 1.12 ng/dL    Comment: (NOTE) Biotin ingestion may interfere with free T4 tests. If the results are inconsistent with the TSH level, previous test results, or the clinical presentation, then consider biotin interference. If needed, order repeat testing after stopping biotin. Performed at [facility], [place] [address] [place], [place] [address]   Magnesium     Status: Abnormal   Collection Time: 11/16/2022  5:45 PM  Result Value Ref Range   Magnesium 2.6 (H) 1.7 - 2.4 mg/dL    Comment: Performed at [facility] [address] [place], [place] [address]  Phosphorus     Status: Abnormal   Collection Time: 10/20/2022  5:45 PM  Result Value Ref Range   Phosphorus 2.1 (L) 2.5 - 4.6 mg/dL    Comment: Performed at [facility] [address], [place], [place] [address]  Blood culture (routine x 2)     Status: None (Preliminary result)   Collection Time: 11/06/2022  6:15 PM   Specimen: BLOOD  Result Value Ref Range   Specimen Description BLOOD RIGHT ANTECUBITAL    Special Requests      BOTTLES DRAWN AEROBIC AND ANAEROBIC Blood Culture adequate volume   Culture      NO GROWTH < 24 HOURS Performed at [facility], [place] [address], [place], [place] [address]    Report Status PENDING   Blood culture (routine x 2)     Status: None (Preliminary result)   Collection Time: 11/02/2022  6:15 PM   Specimen: BLOOD  Result Value Ref Range   Specimen Description BLOOD LEFT ANTECUBITAL    Special Requests  BOTTLES DRAWN AEROBIC AND ANAEROBIC Blood Culture adequate volume   Culture      NO GROWTH < 24 HOURS Performed at [facility], [place] [address], [place], [place] [address]    Report Status PENDING   I-stat chem 8, ED (not at [facility], [facility] or [facility])     Status: Abnormal   Collection Time: 11/07/2022  6:26 PM  Result Value Ref Range   Sodium 170 (HH) 135 - 145 mmol/L    Potassium 3.9 3.5 - 5.1 mmol/L   Chloride 125 (H) 98 - 111 mmol/L   BUN 41 (H) 8 - 23 mg/dL   Creatinine, Ser 1.20 0.61 - 1.24 mg/dL   Glucose, Bld 298 (H) 70 - 99 mg/dL    Comment: Glucose reference range applies only to samples taken after fasting for at least 8 hours.   Calcium, Ion 1.29 1.15 - 1.40 mmol/L   TCO2 33 (H) 22 - 32 mmol/L   Hemoglobin 11.6 (L) 13.0 - 17.0 g/dL   HCT 34.0 (L) 39.0 - 52.0 %   Comment NOTIFIED PHYSICIAN   I-Stat venous blood gas, ([facility] ED, [facility], [facility])     Status: Abnormal   Collection Time: 11/08/2022  6:27 PM  Result Value Ref Range   pH, Ven 7.361 7.25 - 7.43   pCO2, Ven 62.2 (H) 44 - 60 mmHg   pO2, Ven 158 (H) 32 - 45 mmHg   Bicarbonate 35.2 (H) 20.0 - 28.0 mmol/L   TCO2 37 (H) 22 - 32 mmol/L   O2 Saturation 99 %   Acid-Base Excess 8.0 (H) 0.0 - 2.0 mmol/L   Sodium 170 (HH) 135 - 145 mmol/L   Potassium 3.9 3.5 - 5.1 mmol/L   Calcium, Ion 1.30 1.15 - 1.40 mmol/L   HCT 34.0 (L) 39.0 - 52.0 %   Hemoglobin 11.6 (L) 13.0 - 17.0 g/dL   Sample type VENOUS   Urinalysis, Routine w reflex microscopic -Urine, Catheterized; Indwelling urinary catheter     Status: Abnormal   Collection Time: 10/20/2022  9:44 PM  Result Value Ref Range   Color, Urine YELLOW YELLOW   APPearance CLOUDY (A) CLEAR   Specific Gravity, Urine 1.017 1.005 - 1.030   pH 5.0 5.0 - 8.0   Glucose, UA 50 (A) NEGATIVE mg/dL   Hgb urine dipstick MODERATE (A) NEGATIVE   Bilirubin Urine NEGATIVE NEGATIVE   Ketones, ur NEGATIVE NEGATIVE mg/dL   Protein, ur 100 (A) NEGATIVE mg/dL   Nitrite NEGATIVE NEGATIVE   Leukocytes,Ua LARGE (A) NEGATIVE   RBC / HPF 21-50 0 - 5 RBC/hpf   WBC, UA >50 0 - 5 WBC/hpf   Bacteria, UA FEW (A) NONE SEEN   Squamous Epithelial / HPF 0-5 0 - 5 /HPF   WBC Clumps PRESENT     Comment: Performed at [facility], [address] [place], [place] [address]  Lactic acid, plasma     Status: None   Collection Time: 10/22/2022  9:44 PM  Result Value Ref Range   Lactic  Acid, Venous 1.7 0.5 - 1.9 mmol/L    Comment: Performed at [facility] [address], [place],  [address]  Strep pneumoniae urinary antigen     Status: None   Collection Time: 10/27/2022  9:44 PM  Result Value Ref Range   Strep Pneumo Urinary Antigen NEGATIVE NEGATIVE    Comment:        Infection due to S. pneumoniae cannot be absolutely ruled out since  the antigen present may be below the detection limit of the test. Performed at [facility], [place] [address], [place], [place] [address]   CBG monitoring, ED     Status: Abnormal   Collection Time: 11/14/2022 11:41 PM  Result Value Ref Range   Glucose-Capillary 292 (H) 70 - 99 mg/dL    Comment: Glucose reference range applies only to samples taken after fasting for at least 8 hours.  Sodium     Status: Abnormal   Collection Time: 11/09/2022 11:45 PM  Result Value Ref Range   Sodium 163 (HH) 135 - 145 mmol/L    Comment: CRITICAL RESULT CALLED TO, READ BACK BY AND VERIFIED WITH [facility] Pershing Cox RN 10/25/22 0053 Wiliam Ke Performed at [facility], [place] [address] [place], [place] [address]   CBG monitoring, ED     Status: Abnormal   Collection Time: 10/25/22 12:31 AM  Result Value Ref Range   Glucose-Capillary 288 (H) 70 - 99 mg/dL    Comment: Glucose reference range applies only to samples taken after fasting for at least 8 hours.  Osmolality, urine     Status: None   Collection Time: 10/25/22 12:55 AM  Result Value Ref Range   Osmolality, Ur 575 300 - 900 mOsm/kg    Comment: REPEATED TO VERIFY Performed at [facility], [address] [place], [place] [address]   Na and K (sodium & potassium), rand urine     Status: None   Collection Time: 10/25/22 12:55 AM  Result Value Ref Range   Sodium, Ur 26 mmol/L   Potassium Urine 36 mmol/L    Comment: Performed at [facility] [address], [place], [place] [address]  MRSA Next Gen by PCR, Nasal     Status: None   Collection Time: 10/25/22  2:36  AM   Specimen: Nasal Mucosa; Nasal Swab  Result Value Ref Range   MRSA by PCR Next Gen NOT DETECTED NOT DETECTED    Comment: (NOTE) The GeneXpert MRSA Assay (FDA approved for NASAL specimens only), is one component of a comprehensive MRSA colonization surveillance program. It is not intended to diagnose MRSA infection nor to guide or monitor treatment for MRSA infections. Test performance is not FDA approved in patients less than 57 years old. Performed at [facility], [place] [address] [place], [place] [address]   Sodium     Status: Abnormal   Collection Time: 10/25/22  2:47 AM  Result Value Ref Range   Sodium 162 (HH) 135 - 145 mmol/L    Comment: CRITICAL RESULT CALLED TO, READ BACK BY AND VERIFIED WITH Elisabeth Cara RN 10/25/22 0325 Wiliam Ke Performed at [facility], [place] [address] [place], [place] [address]   Osmolality     Status: Abnormal   Collection Time: 10/25/22  2:47 AM  Result Value Ref Range   Osmolality 370 (HH) 275 - 295 mOsm/kg    Comment: REPEATED TO VERIFY CRITICAL RESULT CALLED TO, READ BACK BY AND VERIFIED WITH: Rico Sheehan RN 1030 10/25/22 HNM Performed at [facility], [address] [place], [place] [address]   Sodium     Status: Abnormal   Collection Time: 10/25/22  4:12 AM  Result Value Ref Range   Sodium 162 (HH) 135 - 145 mmol/L    Comment: CRITICAL RESULT CALLED TO, READ BACK BY AND VERIFIED WITH Sharmon Revere RN 10/25/22 0501 Wiliam Ke Performed at [facility], [place]  [address], [place], [place] [address]   Magnesium     Status: None   Collection Time: 10/25/22  4:12 AM  Result Value Ref Range   Magnesium 2.4 1.7 - 2.4 mg/dL    Comment: Performed at [facility] [address] [place], [place] [address]  CBG monitoring, ED     Status: Abnormal   Collection Time: 10/25/22  8:56 AM  Result Value Ref Range   Glucose-Capillary 318 (H) 70 - 99 mg/dL    Comment: Glucose reference range applies only to  samples taken after fasting for at least 8 hours.   Comment 1 Notify RN    Comment 2 Document in Chart   Basic metabolic panel     Status: Abnormal   Collection Time: 10/25/22 10:44 AM  Result Value Ref Range   Sodium 160 (H) 135 - 145 mmol/L   Potassium 3.8 3.5 - 5.1 mmol/L    Comment: HEMOLYSIS AT THIS LEVEL MAY AFFECT RESULT   Chloride 121 (H) 98 - 111 mmol/L   CO2 30 22 - 32 mmol/L   Glucose, Bld 381 (H) 70 - 99 mg/dL    Comment: Glucose reference range applies only to samples taken after fasting for at least 8 hours.   BUN 31 (H) 8 - 23 mg/dL   Creatinine, Ser 1.09 0.61 - 1.24 mg/dL   Calcium 8.1 (L) 8.9 - 10.3 mg/dL   GFR, Estimated >60 >60 mL/min    Comment: (NOTE) Calculated using the CKD-EPI Creatinine Equation (2021)    Anion gap 9 5 - 15    Comment: Performed at [facility] [address], [place], [place] [address]   EEG adult  Result Date: 10/25/2022 Lora Havens, MD     10/25/2022  1:03 AM Patient Name: ANJELO PULLMAN MRN: [id number] Epilepsy Attending: Lora Havens Referring Physician/Provider: Idamae Schuller, MD Date: 10/25/2022 Duration: 26.45 mins Patient history: 72yo M with ams. EEG to evaluate for seizure  Level of alertness: lethargic  AEDs during EEG study: None  Technical aspects: This EEG study was done with scalp electrodes positioned according to the 10-20 International system of electrode placement. Electrical activity was reviewed with band pass filter of 1-'70Hz'$ , sensitivity of 7 uV/mm, display speed of 70m/sec with a '60Hz'$  notched filter applied as appropriate. EEG data were recorded continuously and digitally stored.  Video monitoring was available and reviewed as appropriate.  Description: No clear posterior dominant rhythm was seen.  EEG showed continuous generalized predominantly 6 to 7 Hz theta slowing admixed with 2-'3Hz'$  delta slowing.  Generalized periodic discharges with triphasic morphology at 1 Hz were also noted intermittently, more prominent  when awake/stimulated. Photic driving was not seen during photic stimulation. Hyperventilation was not performed.    ABNORMALITY - Periodic discharges with triphasic morphology, generalized ( GPDs) - Continuous slow, generalized  IMPRESSION: This study showed generalized periodic discharges with triphasic morphology at 1 Hz which can be on the ictal-interictal continuum. However, the morphology, frequency and reactivity to stimulation is more likely suggestive of toxic-metabolic causes.  Additionally there is moderate diffuse encephalopathy, nonspecific etiology.  No seizures were seen during the study.  PLora Havens   CT Head Wo Contrast  Result Date: 11/12/2022 CLINICAL DATA:  Mental status change, unknown cause EXAM: CT HEAD WITHOUT CONTRAST TECHNIQUE: Contiguous axial images were obtained from the base of the skull through the vertex without intravenous contrast. RADIATION DOSE REDUCTION: This exam was performed according to the departmental dose-optimization  program which includes automated exposure control, adjustment of the mA and/or kV according to patient size and/or use of iterative reconstruction technique. COMPARISON:  Head CT 09/11/2022 FINDINGS: Brain: No intracranial hemorrhage, mass effect, or midline shift. Stable degree of generalized atrophy. No hydrocephalus. The basilar cisterns are patent. Stable periventricular chronic small vessel ischemia. Again seen remote lacunar infarcts in the left basal ganglia and left caudate. No evidence of territorial infarct or acute ischemia. No extra-axial or intracranial fluid collection. Vascular: Atherosclerosis of skullbase vasculature without hyperdense vessel or abnormal calcification. Skull: No fracture or focal lesion. Sinuses/Orbits: Paranasal sinuses and mastoid air cells are clear. The visualized orbits are unremarkable. Other: Soft tissue thickening of the left occipital scalp was present on prior exam and may represent scarring. IMPRESSION:  1. No acute intracranial abnormality. 2. Stable atrophy and chronic small vessel ischemia. Remote lacunar infarcts in the left basal ganglia and left caudate. Electronically Signed   By: Keith Rake M.D.   On: 11/02/2022 18:04   DG Chest Portable 1 View  Result Date: 10/23/2022 CLINICAL DATA:  Aspiration EXAM: PORTABLE CHEST 1 VIEW COMPARISON:  Portable exam 1558 hours compared to 08/14/2022 FINDINGS: Upper normal heart size. Mediastinal contours and pulmonary vascularity normal. Mild RIGHT basilar atelectasis. Atelectasis versus infiltrate LEFT base, aspiration not excluded. Upper lungs clear. No pleural effusion or pneumothorax. IMPRESSION: Atelectasis versus infiltrate at LEFT base, aspiration not excluded. Subsegmental atelectasis RIGHT base. Electronically Signed   By: Lavonia Dana M.D.   On: 10/31/2022 16:12    Pending Labs Unresulted Labs (From admission, onward)     Start     Ordered   10/25/22 0932  Basic metabolic panel  Now then every 8 hours,   R     Question:  Specimen collection method  Answer:  IV Team=IV Team collect   10/25/22 1059   10/25/22 0442  Urea nitrogen, urine  Add-on,   AD        10/25/22 0441   10/25/22 0248  Remove and replace urinary cath (placed > 5 days) then obtain urine culture from new indwelling urinary catheter.  (Urine Culture)  Once,   R       Question:  Indication  Answer:  Altered mental status (if no other cause identified)   10/25/22 0247   10/25/2022 2144  Legionella Pneumophila Serogp 1 Ur Ag  Once,   R        11/02/2022 2144            Vitals/Pain Today's Vitals   10/25/22 0730 10/25/22 0800 10/25/22 0830 10/25/22 0852  BP: (!) 148/78 134/77 (!) 116/100   Pulse: 90 86 85   Resp: '17 17 18   '$ Temp:    98.5 F (36.9 C)  TempSrc:    Axillary  SpO2: 99% 100% 100%     Isolation Precautions No active isolations  Medications Medications  enoxaparin (LOVENOX) injection 40 mg (40 mg Subcutaneous Given 10/25/22 1224)  valproate (DEPACON) 250  mg in dextrose 5 % 50 mL IVPB (250 mg Intravenous New Bag/Given 10/25/22 1145)  azithromycin (ZITHROMAX) 250 mg in dextrose 5 % 125 mL IVPB (has no administration in time range)  aspirin suppository 150 mg (150 mg Rectal Given 10/25/22 1222)  piperacillin-tazobactam (ZOSYN) IVPB 3.375 g (0 g Intravenous Stopped 10/25/22 0121)    Followed by  piperacillin-tazobactam (ZOSYN) IVPB 3.375 g (0 g Intravenous Stopped 10/25/22 0924)  dextrose 5 %-0.45 % sodium chloride infusion (0 mLs Intravenous Paused 10/25/22 1121)  azithromycin (  ZITHROMAX) 500 mg in sodium chloride 0.9 % 250 mL IVPB (0 mg Intravenous Stopped 11/12/2022 2129)  Ampicillin-Sulbactam (UNASYN) 3 g in sodium chloride 0.9 % 100 mL IVPB (0 g Intravenous Stopped 10/20/2022 2046)  insulin aspart (novoLOG) injection 8 Units (8 Units Subcutaneous Given 10/25/22 0049)  lactated ringers bolus 2,000 mL (2,000 mLs Intravenous New Bag/Given 10/25/22 1137)    Mobility non-ambulatory     Focused Assessments Pt normally A & 0 x2 to self   R Recommendations: See Admitting Provider Note  Report given to:   Additional Notes: Pt from [facility] told he normally carry a conversation.. he was found unresponsive. Ct head negative. Admission Na was in 170. He also have aspiration pneumonia. nHe have a chronic foley. It was change in the ED by the third shift nurse. He suppose to get neuro checks every 2 hours. Pt osmolarity is 370. He is getting 2 Liters of LR now.

## 2022-10-25 NOTE — Progress Notes (Addendum)
Subjective:  Patient is lying in bed with eyes closed, is unable to follow commands. Will move and withdraw extremities to painful stimulus.   Patient remained afebrile and stable overnight on room air.   Objective:  Vital signs in last 24 hours: Vitals:   10/25/22 0730 10/25/22 0800 10/25/22 0830 10/25/22 0852  BP: (!) 148/78 134/77 (!) 116/100   Pulse: 90 86 85   Resp: '17 17 18   '$ Temp:    98.5 F (36.9 C)  TempSrc:    Axillary  SpO2: 99% 100% 100%    Weight change:   Intake/Output Summary (Last 24 hours) at 10/25/2022 1254 Last data filed at 10/25/2022 2979 Gross per 24 hour  Intake 1012.74 ml  Output 475 ml  Net 537.74 ml   Physical Exam General: Not alert. Does not appear to be in acute distress.  HEENT: Dry mucous membranes, thick secretions in mouth.  Pulm: Normal respiratory effort on room air.  Skin: Decreased skin turgor.  Ext: No LE edema Neuro: Not alert or oriented, non-communicative.  Psych: Unable to assess  Assessment/Plan:  Principal Problem:   Acute metabolic encephalopathy Active Problems:   Type 2 diabetes mellitus with neurological complications (HCC)   Dementia (HCC)   Bladder outlet obstruction   Controlled diabetes mellitus type 2 with complications ([place])   Hypernatremia   Moderate protein-calorie malnutrition (HCC)  Charles Fox is a 72 year old man with past medical history significant for T2DM, CVA, advanced dementia, and seizure on Depakote who presented for altered mental status and admitted for treatment of acute metabolic encephalopathy in the setting of dehydration.   Hypovolemic Hypernatremia  Patient remains significantly volume-depleted on exam today. Na improved slightly 162>>160 today on IVF, Urine osmolality 575. Free water deficit is 7L. Will give 2L LR bolus, then continuous D5 1/2NS for today.  -Sodium checks q6h -IV 2L LR bolus '@500mL'$ /hr, then IV D5 1/2NS '@200mL'$ /hr  Acute Metabolic Encephalopathy  Patient's mental  status is unchanged today, patient remains unresponsive to voice and only withdraws to painful stimulus. Neuro exam is limited by patient's mental status, but no focal deficits were appreciated. I spoke with patient's friend/significant other [name], who stated that patient was confused last Monday at [facility] when she visited and that he was very thirsty and seemed dehydrated, and he was treated for oropharyngeal candidiasis after evaluation by an NP at [facility]. Per [name], patient is able to carry simple conversations at baseline and was still eating/drinking last week. Overall, patient has had worsening mental status and declining PO intake over the last two months in the setting of recent sepsis, seizures, COVID infection, and possible new aspiration pneumonia this admission. We will continue to treat possible infectious sources contributing to poor mental status, patient is on empiric antibiotics for aspiration pneumonia which will also cover for likely UTI given urinalysis findings. His poor PO intake is currently limited by his mental status, there are thick secretions but not characteristic of oropharyngeal candidiasis. -Unasyn (1/5) -NPO until he can pass bedside swallow -Blood cultures no growth <24hr -Delirium precautions  Aspiration Pneumonia Leukocytosis Patient not having any signs of increased respiratory effort on exam, continuing empiric coverage for likely aspiration pneumonia.  -IV Unasyn (1/5)  T2DM  Last A1c 6.6 on 08/08/2022. CBG this morning 288.  -Hold home metformin  -Hold home lantus 12 units  Hypermagnesemia - resolved  Hx of CVA -Continue home aspirin '150mg'$  per rectum -Hold home atorvastatin due to NPO status  Hx of HTN BP stable, will continue to monitor.  -Hold home irbesartan, carvedilol.   Insomnia -Hold home melatonin  Goals of Care Will continue to treat and evaluate patient for improvement in mental status but patient has been on overall  decline. Legal guardian is Creig Hines, will plan to have goals of care discussion if no significant improvement this admission.   Full Code Diet: NPO VTE: Enoxaparin IVF: D5 1/2 NS '@200mL'$ /hr    LOS: 1 day   Spero Curb, Medical Student 10/25/2022, 12:54 PM

## 2022-10-25 NOTE — Progress Notes (Signed)
EEG complete - results pending 

## 2022-10-26 DIAGNOSIS — E870 Hyperosmolality and hypernatremia: Secondary | ICD-10-CM | POA: Diagnosis not present

## 2022-10-26 DIAGNOSIS — G9341 Metabolic encephalopathy: Secondary | ICD-10-CM | POA: Diagnosis not present

## 2022-10-26 LAB — CBC
HCT: 35 % — ABNORMAL LOW (ref 39.0–52.0)
Hemoglobin: 10.1 g/dL — ABNORMAL LOW (ref 13.0–17.0)
MCH: 29.5 pg (ref 26.0–34.0)
MCHC: 28.9 g/dL — ABNORMAL LOW (ref 30.0–36.0)
MCV: 102.3 fL — ABNORMAL HIGH (ref 80.0–100.0)
Platelets: 274 10*3/uL (ref 150–400)
RBC: 3.42 MIL/uL — ABNORMAL LOW (ref 4.22–5.81)
RDW: 16.1 % — ABNORMAL HIGH (ref 11.5–15.5)
WBC: 19 10*3/uL — ABNORMAL HIGH (ref 4.0–10.5)
nRBC: 0 % (ref 0.0–0.2)

## 2022-10-26 LAB — BASIC METABOLIC PANEL
Anion gap: 10 (ref 5–15)
Anion gap: 8 (ref 5–15)
BUN: 20 mg/dL (ref 8–23)
BUN: 15 mg/dL (ref 8–23)
CO2: 30 mmol/L (ref 22–32)
CO2: 32 mmol/L (ref 22–32)
Calcium: 8.5 mg/dL — ABNORMAL LOW (ref 8.9–10.3)
Calcium: 8.2 mg/dL — ABNORMAL LOW (ref 8.9–10.3)
Chloride: 122 mmol/L — ABNORMAL HIGH (ref 98–111)
Chloride: 121 mmol/L — ABNORMAL HIGH (ref 98–111)
Creatinine, Ser: 0.85 mg/dL (ref 0.61–1.24)
Creatinine, Ser: 0.8 mg/dL (ref 0.61–1.24)
GFR, Estimated: >60 mL/min (ref >60)
GFR, Estimated: >60 mL/min (ref >60)
Glucose, Bld: 112 mg/dL — ABNORMAL HIGH (ref 70–99)
Glucose, Bld: 198 mg/dL — ABNORMAL HIGH (ref 70–99)
Potassium: 3.7 mmol/L (ref 3.5–5.1)
Potassium: 3.3 mmol/L — ABNORMAL LOW (ref 3.5–5.1)
Sodium: 162 mmol/L (ref 135–145)
Sodium: 161 mmol/L (ref 135–145)

## 2022-10-26 LAB — URINE CULTURE: Culture: NO GROWTH

## 2022-10-26 LAB — GLUCOSE, CAPILLARY
Glucose-Capillary: 124 mg/dL — ABNORMAL HIGH (ref 70–99)
Glucose-Capillary: 127 mg/dL — ABNORMAL HIGH (ref 70–99)
Glucose-Capillary: 147 mg/dL — ABNORMAL HIGH (ref 70–99)
Glucose-Capillary: 153 mg/dL — ABNORMAL HIGH (ref 70–99)
Glucose-Capillary: 174 mg/dL — ABNORMAL HIGH (ref 70–99)
Glucose-Capillary: 218 mg/dL — ABNORMAL HIGH (ref 70–99)
Glucose-Capillary: 225 mg/dL — ABNORMAL HIGH (ref 70–99)

## 2022-10-26 LAB — LEGIONELLA PNEUMOPHILA SEROGP 1 UR AG: L. pneumophila Serogp 1 Ur Ag: NEGATIVE

## 2022-10-26 NOTE — Progress Notes (Signed)
  Transition of Care [facility]) Screening Note   Patient Details  Name: Charles Fox Date of Birth: 26-Feb-1951   Transition of Care [facility] CM/SW Contact:    Benard Halsted, LCSW Phone Number: 10/26/2022, 8:49 AM    Transition of Care Department [facility]) has reviewed patient from [facility] with a state legal guardian. We will continue to monitor patient advancement through interdisciplinary progression rounds. If new patient transition needs arise, please place a TOC consult.

## 2022-10-26 NOTE — Progress Notes (Signed)
                 Interval history Largely unresponsive.  Groans when his name is called.  Physical exam Blood pressure (!) 153/60, pulse 73, temperature 97.6 F (36.4 C), temperature source Axillary, resp. rate 16, SpO2 97 %.  Obtunded appearing Heart rate and rhythm are regular Breathing is regular and unlabored Abdomen is soft and nontender Skin is warm and dry Intermittently stirs and groans to verbal stimulus, moving all 4 extremities spontaneously  Labs, images, and other studies Sodium 162, stable WBC 19, increased Hemoglobin stable Blood cultures no growth x 2 days  Assessment and plan Hospital day Severance is a 72 y.o. with dementia admitted for severe encephalopathy and hypernatremia.  Principal Problem:   Acute metabolic encephalopathy Active Problems:   Type 2 diabetes mellitus with neurological complications (HCC)   Dementia (HCC)   Bladder outlet obstruction   Controlled diabetes mellitus type 2 with complications (HCC)   Hypernatremia   Moderate protein-calorie malnutrition (HCC)  Metabolic encephalopathy No significant change in this person's mental status today.  Moving extremities spontaneously.  Groans to voice.  Protecting airway.  Will continue treating underlying reversible causes.  However, overall prognosis is poor.  This team's recommendation is to update CODE STATUS to DNR and we are working with this person state appointed guardian to implement that change. - Remain n.p.o.  Aspiration pneumonia Leukocytosis Afebrile.  Respiratory status is stable without hypoxia.  Continue empiric coverage for aspiration pneumonia. - Ampicillin-sulbactam 3 g IV every 6 hours through 10/28/2022  Hypernatremia Patient is volume resuscitated.  Sodium remains high.  Continue to replace this patient's large free water deficit. - D5 0.45% NS IV at 150 mL/h - Twice daily BMP  Type 2 diabetes mellitus Glucose controlled between 100-150. - Continue  sensitive correctional dose insulin  History of CVA - Aspirin 150 mg daily per rectum  Diet: N.p.o. IVF: D5 0.45% NS at 150 mL/h VTE: Lovenox Code: Full, pending discussion with state appointed guardian  Nani Gasser MD 10/26/2022, 6:31 AM  Pager: [phone] After 5pm on weekdays and 1pm on weekends: [phone]

## 2022-10-26 NOTE — Plan of Care (Signed)

## 2022-10-27 DIAGNOSIS — G9341 Metabolic encephalopathy: Secondary | ICD-10-CM | POA: Diagnosis not present

## 2022-10-27 DIAGNOSIS — E870 Hyperosmolality and hypernatremia: Secondary | ICD-10-CM | POA: Diagnosis not present

## 2022-10-27 LAB — GLUCOSE, CAPILLARY
Glucose-Capillary: 231 mg/dL — ABNORMAL HIGH (ref 70–99)
Glucose-Capillary: 168 mg/dL — ABNORMAL HIGH (ref 70–99)
Glucose-Capillary: 145 mg/dL — ABNORMAL HIGH (ref 70–99)
Glucose-Capillary: 183 mg/dL — ABNORMAL HIGH (ref 70–99)
Glucose-Capillary: 177 mg/dL — ABNORMAL HIGH (ref 70–99)
Glucose-Capillary: 191 mg/dL — ABNORMAL HIGH (ref 70–99)

## 2022-10-27 LAB — BASIC METABOLIC PANEL
Anion gap: 9 (ref 5–15)
Anion gap: 9 (ref 5–15)
Anion gap: 10 (ref 5–15)
BUN: 10 mg/dL (ref 8–23)
BUN: 7 mg/dL — ABNORMAL LOW (ref 8–23)
BUN: 6 mg/dL — ABNORMAL LOW (ref 8–23)
CO2: 30 mmol/L (ref 22–32)
CO2: 33 mmol/L — ABNORMAL HIGH (ref 22–32)
CO2: 31 mmol/L (ref 22–32)
Calcium: 8.2 mg/dL — ABNORMAL LOW (ref 8.9–10.3)
Calcium: 8.2 mg/dL — ABNORMAL LOW (ref 8.9–10.3)
Calcium: 8 mg/dL — ABNORMAL LOW (ref 8.9–10.3)
Chloride: 119 mmol/L — ABNORMAL HIGH (ref 98–111)
Chloride: 115 mmol/L — ABNORMAL HIGH (ref 98–111)
Chloride: 110 mmol/L (ref 98–111)
Creatinine, Ser: 0.73 mg/dL (ref 0.61–1.24)
Creatinine, Ser: 0.68 mg/dL (ref 0.61–1.24)
Creatinine, Ser: 0.68 mg/dL (ref 0.61–1.24)
GFR, Estimated: >60 mL/min (ref >60)
GFR, Estimated: >60 mL/min (ref >60)
GFR, Estimated: >60 mL/min (ref >60)
Glucose, Bld: 186 mg/dL — ABNORMAL HIGH (ref 70–99)
Glucose, Bld: 152 mg/dL — ABNORMAL HIGH (ref 70–99)
Glucose, Bld: 196 mg/dL — ABNORMAL HIGH (ref 70–99)
Potassium: 3.1 mmol/L — ABNORMAL LOW (ref 3.5–5.1)
Potassium: 2.9 mmol/L — ABNORMAL LOW (ref 3.5–5.1)
Potassium: 3.3 mmol/L — ABNORMAL LOW (ref 3.5–5.1)
Sodium: 158 mmol/L — ABNORMAL HIGH (ref 135–145)
Sodium: 157 mmol/L — ABNORMAL HIGH (ref 135–145)
Sodium: 151 mmol/L — ABNORMAL HIGH (ref 135–145)

## 2022-10-27 LAB — CBC
HCT: 34.5 % — ABNORMAL LOW (ref 39.0–52.0)
Hemoglobin: 10.2 g/dL — ABNORMAL LOW (ref 13.0–17.0)
MCH: 29.3 pg (ref 26.0–34.0)
MCHC: 29.6 g/dL — ABNORMAL LOW (ref 30.0–36.0)
MCV: 99.1 fL (ref 80.0–100.0)
Platelets: 301 10*3/uL (ref 150–400)
RBC: 3.48 MIL/uL — ABNORMAL LOW (ref 4.22–5.81)
RDW: 15.9 % — ABNORMAL HIGH (ref 11.5–15.5)
WBC: 16.5 10*3/uL — ABNORMAL HIGH (ref 4.0–10.5)
nRBC: 0 % (ref 0.0–0.2)

## 2022-10-27 LAB — MAGNESIUM: Magnesium: 1.6 mg/dL — ABNORMAL LOW (ref 1.7–2.4)

## 2022-10-27 LAB — OSMOLALITY, URINE: Osmolality, Ur: 425 mOsm/kg (ref 300–900)

## 2022-10-27 MED ORDER — DEXTROSE 5 % IV SOLN: INTRAVENOUS | Status: AC

## 2022-10-27 MED ORDER — DEXTROSE-NACL 5-0.45 % IV SOLN: INTRAVENOUS | Status: DC

## 2022-10-27 MED ORDER — POTASSIUM CHLORIDE 10 MEQ/100ML IV SOLN
10.0000 meq | INTRAVENOUS | Status: AC
  Administered 2022-10-27 (×6): 10 meq via INTRAVENOUS
  Filled 2022-10-27 (×6): qty 100

## 2022-10-27 NOTE — Care Management Important Message (Signed)
Important Message  Patient Details  Name: Charles Fox MRN: [id number] Date of Birth: 10-19-1950   Medicare Important Message Given:  Yes     Orbie Pyo 10/27/2022, 1:55 PM

## 2022-10-27 NOTE — Progress Notes (Signed)
Subjective:  Patient lying in bed, will open eyes to voice but cannot follow commands.   Objective:  Vital signs in last 24 hours: Vitals:   10/27/22 0000 10/27/22 0400 10/27/22 0600 10/27/22 0800  BP: (!) 145/70 (!) 147/57 137/66 (!) 158/79  Pulse: 72 68 76 80  Resp: '18 18 18 18  '$ Temp:  98.2 F (36.8 C)  98.3 F (36.8 C)  TempSrc:  Oral  Axillary  SpO2: 100% 100% 98% 100%   Weight change:   Intake/Output Summary (Last 24 hours) at 10/27/2022 1043 Last data filed at 10/27/2022 1024 Gross per 24 hour  Intake 457.5 ml  Output 3050 ml  Net -2592.5 ml      Latest Ref Rng & Units 10/27/2022    7:16 AM 10/26/2022    4:48 PM 10/26/2022    5:03 AM  BMP  Glucose 70 - 99 mg/dL 186  198  112   BUN 8 - 23 mg/dL '10  15  20   '$ Creatinine 0.61 - 1.24 mg/dL 0.73  0.80  0.85   Sodium 135 - 145 mmol/L 158  161  162   Potassium 3.5 - 5.1 mmol/L 3.1  3.3  3.7   Chloride 98 - 111 mmol/L 119  121  122   CO2 22 - 32 mmol/L 30  32  30   Calcium 8.9 - 10.3 mg/dL 8.2  8.2  8.5    CBC    Component Value Date/Time   WBC 16.5 (H) 10/27/2022 0716   RBC 3.48 (L) 10/27/2022 0716   HGB 10.2 (L) 10/27/2022 0716   HGB 15.4 02/25/2021 1009   HCT 34.5 (L) 10/27/2022 0716   HCT 44.6 02/25/2021 1009   PLT 301 10/27/2022 0716   PLT 304 02/25/2021 1009   MCV 99.1 10/27/2022 0716   MCV 86 02/25/2021 1009   MCH 29.3 10/27/2022 0716   MCHC 29.6 (L) 10/27/2022 0716   RDW 15.9 (H) 10/27/2022 0716   RDW 12.0 02/25/2021 1009   LYMPHSABS 2.0 11/13/2022 1745   MONOABS 0.9 10/25/2022 1745   EOSABS 0.1 11/01/2022 1745   BASOSABS 0.0 10/21/2022 1745     Physical Exam General: Not alert. Does not appear in acute distress.  HEENT: Poor dentition. CV: RRR, no murmurs, rubs, or gallops.  Pulm: Normal respiratory effort on room air Abd: Soft, non-tender. No rigidity or guarding.  MSK: 4/5 LUE strength, 3/5 RUE strength Ext: No LE edema Neuro: Not alert or oriented. Non-communicating. Opens eyes to voice.   Psych: Unable to assess  Assessment/Plan:  Principal Problem:   Acute metabolic encephalopathy Active Problems:   Type 2 diabetes mellitus with neurological complications (HCC)   Dementia (HCC)   Bladder outlet obstruction   Controlled diabetes mellitus type 2 with complications ([place])   Hypernatremia   Moderate protein-calorie malnutrition (HCC)   Charles Fox is a 72 year old man with past medical history significant for T2DM, CVA, advanced dementia, and seizure on Depakote who presented for change in mental status and admitted for treatment of acute metabolic encephalopathy and hypernatremia in the setting of dehydration.   Hypernatremia  Na 158 today, free water deficit is 6.3L. Will continue fluids today and recheck sodium. -IV D5W '@100mL'$ /hr continuous -BMP  Acute Metabolic Encephalopathy  Patient's mental status is still largely unchanged today and still far from reported baseline. He will open eyes briefly to voice but does not remain alert. He does not appear to be uncomfortable or in distress and vitals  have remained stable since admission. Plan to continue treating reversible causes today.  -NPO -Delirium precautions  Aspiration Pneumonia Leukocytosis Patient afebrile and stable overnight, WBC improved to 16.5 today. Will continue empiric antibiotics. Blood cultures and urine cultures negative so far.  -IV Unasyn 3g q6h (day 4/5 of abx)  T2DM  CBG this morning 231 -SSI, sensitive -Hold home metformin  -Hold home lantus 12 units   Hx of CVA -Continue home aspirin '150mg'$  per rectum -Hold home atorvastatin due to NPO status   Hx of HTN BP stable, will continue to monitor.  -Hold home irbesartan, carvedilol.    Insomnia -Hold home melatonin  Hypermagnesemia - resolved     Full Code, pending legal guardian to approve DNR paperwork.  Diet: NPO VTE: Enoxaparin IVF: D5 '@100mL'$ /hr continuous   LOS: 3 days   Spero Curb, Medical Student 10/27/2022, 10:43  AM

## 2022-10-27 NOTE — Inpatient Diabetes Management (Signed)
Inpatient Diabetes Program Recommendations  AACE/ADA: New Consensus Statement on Inpatient Glycemic Control (2015)  Target Ranges:  Prepandial:   less than 140 mg/dL      Peak postprandial:   less than 180 mg/dL (1-2 hours)      Critically ill patients:  140 - 180 mg/dL   Lab Results  Component Value Date   GLUCAP 168 (H) 10/27/2022   HGBA1C 6.6 (H) 08/08/2022    Review of Glycemic Control  Latest Reference Range & Units 10/26/22 20:30 10/26/22 23:46 10/27/22 04:46 10/27/22 07:59  Glucose-Capillary 70 - 99 mg/dL 225 (H) 218 (H) 231 (H) 168 (H)  (H): Data is abnormally high Diabetes history: Type 2 DM Outpatient Diabetes medications: Lantus 12 units QD (NT?) Current orders for Inpatient glycemic control: Novolog 0-9 units Q4H  Inpatient Diabetes Program Recommendations:    Consider adding Semglee 10 units QHS.  Thanks, Bronson Curb, MSN, RNC-OB Diabetes Coordinator [phone] (8a-5p)

## 2022-10-28 ENCOUNTER — Inpatient Hospital Stay (HOSPITAL_COMMUNITY): Payer: Medicare Other

## 2022-10-28 DIAGNOSIS — G9341 Metabolic encephalopathy: Secondary | ICD-10-CM | POA: Diagnosis not present

## 2022-10-28 LAB — BASIC METABOLIC PANEL
Anion gap: 9 (ref 5–15)
BUN: 6 mg/dL — ABNORMAL LOW (ref 8–23)
CO2: 34 mmol/L — ABNORMAL HIGH (ref 22–32)
Calcium: 8.1 mg/dL — ABNORMAL LOW (ref 8.9–10.3)
Chloride: 105 mmol/L (ref 98–111)
Creatinine, Ser: 0.61 mg/dL (ref 0.61–1.24)
GFR, Estimated: >60 mL/min (ref >60)
Glucose, Bld: 192 mg/dL — ABNORMAL HIGH (ref 70–99)
Potassium: 3.2 mmol/L — ABNORMAL LOW (ref 3.5–5.1)
Sodium: 148 mmol/L — ABNORMAL HIGH (ref 135–145)

## 2022-10-28 LAB — PHOSPHORUS
Phosphorus: 1.8 mg/dL — ABNORMAL LOW (ref 2.5–4.6)
Phosphorus: 1.4 mg/dL — ABNORMAL LOW (ref 2.5–4.6)

## 2022-10-28 LAB — GLUCOSE, CAPILLARY
Glucose-Capillary: 198 mg/dL — ABNORMAL HIGH (ref 70–99)
Glucose-Capillary: 142 mg/dL — ABNORMAL HIGH (ref 70–99)
Glucose-Capillary: 155 mg/dL — ABNORMAL HIGH (ref 70–99)
Glucose-Capillary: 180 mg/dL — ABNORMAL HIGH (ref 70–99)
Glucose-Capillary: 198 mg/dL — ABNORMAL HIGH (ref 70–99)
Glucose-Capillary: 214 mg/dL — ABNORMAL HIGH (ref 70–99)

## 2022-10-28 LAB — MAGNESIUM
Magnesium: 1.4 mg/dL — ABNORMAL LOW (ref 1.7–2.4)
Magnesium: 1.3 mg/dL — ABNORMAL LOW (ref 1.7–2.4)
Magnesium: 1.7 mg/dL (ref 1.7–2.4)

## 2022-10-28 MED ORDER — MAGNESIUM SULFATE 2 GM/50ML IV SOLN
2.0000 g | Freq: Once | INTRAVENOUS | Status: AC
  Administered 2022-10-28: 2 g via INTRAVENOUS
  Filled 2022-10-28: qty 50

## 2022-10-28 MED ORDER — DEXTROSE 5 % IV SOLN: INTRAVENOUS | Status: DC

## 2022-10-28 MED ORDER — FREE WATER
200.0000 mL | Status: DC
  Administered 2022-10-29 – 2022-10-30 (×9): 200 mL

## 2022-10-28 MED ORDER — THIAMINE MONONITRATE 100 MG PO TABS
100.0000 mg | ORAL_TABLET | Freq: Every day | ORAL | Status: AC
  Administered 2022-10-28 – 2022-11-01 (×5): 100 mg
  Filled 2022-10-28 (×5): qty 1

## 2022-10-28 MED ORDER — DEXTROSE-NACL 5-0.45 % IV SOLN: INTRAVENOUS | Status: AC

## 2022-10-28 MED ORDER — POTASSIUM CHLORIDE 10 MEQ/100ML IV SOLN
10.0000 meq | INTRAVENOUS | Status: AC
  Administered 2022-10-28 (×6): 10 meq via INTRAVENOUS
  Filled 2022-10-28 (×7): qty 100

## 2022-10-28 MED ORDER — GLUCERNA 1.5 CAL PO LIQD
1000.0000 mL | ORAL | Status: DC
  Administered 2022-10-28: 1000 mL
  Filled 2022-10-28 (×9): qty 1000

## 2022-10-28 NOTE — Procedures (Signed)
Cortrak  Person Inserting Tube:  Alroy Dust, Asuzena Weis L, RD Tube Type:  Cortrak - 43 inches Tube Size:  10 Tube Location:  Left nare Secured by: Bridle Technique Used to Measure Tube Placement:  Marking at nare/corner of mouth Cortrak Secured At:  64 cm   Cortrak Tube Team Note:  Consult received to place a Cortrak feeding tube.   X-ray is required, abdominal x-ray has been ordered by the Cortrak team. Please confirm tube placement before using the Cortrak tube.   If the tube becomes dislodged please keep the tube and contact the Cortrak team at [email or web address] for replacement.  If after hours and replacement cannot be delayed, place a NG tube and confirm placement with an abdominal x-ray.    Hermina Barters RD, LDN Clinical Dietitian See Shea Evans for contact information.

## 2022-10-28 NOTE — Progress Notes (Signed)
Initial Nutrition Assessment  DOCUMENTATION CODES:   Non-severe (moderate) malnutrition in context of chronic illness  INTERVENTION:   Recommend obtaining new weight. Recommend starting Thiamine 100 mg daily x 5 days due to high risk of refeeding.  Tube Feeds via Cortrak:  Start Glucerna 1.5 at 20 mL/hr and advance by 10 mL every 12 hours to goal rate of 60 mL/hr (1440 mL per day) 150 mL free water flush per tube Provides 2160 kcal, 119 gm protein, and 1992 mL total free water daily. Monitor magnesium, potassium, and phosphorus BID for at least 3 days, MD to replete as needed, as pt is at risk for refeeding syndrome given malnutrition and NPO x 4 days.  NUTRITION DIAGNOSIS:   Moderate Malnutrition related to chronic illness as evidenced by severe muscle depletion, mild muscle depletion.  GOAL:   Patient will meet greater than or equal to 90% of their needs  MONITOR:   Diet advancement, Labs, TF tolerance, I & O's  REASON FOR ASSESSMENT:   Consult Assessment of nutrition requirement/status, Enteral/tube feeding initiation and management  ASSESSMENT:   72 y.o. male presented to the ED with AMS. PMH includes T2DM, HTN, HLD, dementia, prior stroke, and epilepsy. Pt admitted with acute metabolic encephalopathy, hypernatremia, and acute respiratory failure secondary to aspiration pneumonia.   2/05 - Admitted 2/09 - Cortrak placed (tip distal stomach/proximal duodenum)  Pt remains NPO, plan for Cortrak placement.  Pt laying in bed, unable to provide any nutrition history or weight history. Pt with no weight this admission, RD requested new weight from RN. Pt friend came to bedside during RD visit, reports that he was on pureed diet at the facility. Noted in H&P, that he has not been eating well prior to admission.   Medications reviewed and include: NovoLog SSI, IV antibiotics, IV potassium chloride, D5 Labs reviewed: Sodium 148 (H), Potassium 3.2 (L), BUN 6, Magnesium 1.4 (L),  Hgb A1c 6.6% (08/08/22), 24 hr CBGs 155-198  NUTRITION - FOCUSED PHYSICAL EXAM:  Flowsheet Row Most Recent Value  Orbital Region Mild depletion  Upper Arm Region No depletion  Thoracic and Lumbar Region No depletion  Buccal Region Mild depletion  Temple Region No depletion  Clavicle Bone Region Severe depletion  Clavicle and Acromion Bone Region Severe depletion  Scapular Bone Region Severe depletion  Dorsal Hand Moderate depletion  Patellar Region Moderate depletion  Anterior Thigh Region Moderate depletion  Posterior Calf Region Moderate depletion  Edema (RD Assessment) None  Hair Reviewed  Eyes Unable to assess  Mouth Unable to assess  Skin Reviewed  Nails Reviewed   Diet Order:   Diet Order             Diet NPO time specified  Diet effective now                  EDUCATION NEEDS:   Not appropriate for education at this time  Skin:  Skin Assessment: Reviewed RN Assessment  Last BM:  Unknown  Height:  Ht Readings from Last 1 Encounters:  08/09/22 6' (1.829 m)   Weight:  Wt Readings from Last 1 Encounters:  08/29/22 98.3 kg   Ideal Body Weight:  80.9 kg  BMI:  There is no height or weight on file to calculate BMI.  Estimated Nutritional Needs:  Kcal:  2000-2200 Protein:  100-115 grams Fluid:  >/= 2 L   Hermina Barters RD, LDN Clinical Dietitian See [facility] for contact information.

## 2022-10-28 NOTE — Progress Notes (Signed)
Subjective:  Patient is lying in bed, appears comfortable.   Provided friend Charles Fox) updates at bedside yesterday   Objective:  Vital signs in last 24 hours: Vitals:   10/28/22 0300 10/28/22 0400 10/28/22 0600 10/28/22 0800  BP: (!) 153/73 (!) 147/75  (!) 164/93  Pulse: 87 83 88 93  Resp: 16 18 (!) 22 19  Temp: 97.7 F (36.5 C)   98.6 F (37 C)  TempSrc: Axillary   Axillary  SpO2: 100% 100% 99% 100%   Weight change:   Intake/Output Summary (Last 24 hours) at 10/28/2022 1013 Last data filed at 10/28/2022 0820 Gross per 24 hour  Intake 1660.83 ml  Output 3350 ml  Net -1689.17 ml      Latest Ref Rng & Units 10/28/2022    4:55 AM 10/27/2022   10:24 PM 10/27/2022    2:20 PM  BMP  Glucose 70 - 99 mg/dL 192  196  152   BUN 8 - 23 mg/dL 6  6  7   $ Creatinine 0.61 - 1.24 mg/dL 0.61  0.68  0.68   Sodium 135 - 145 mmol/L 148  151  157   Potassium 3.5 - 5.1 mmol/L 3.2  3.3  2.9   Chloride 98 - 111 mmol/L 105  110  115   CO2 22 - 32 mmol/L 34  31  33   Calcium 8.9 - 10.3 mg/dL 8.1  8.0  8.2    Physical Exam General: Opens eyes to voice, not alert. In no acute distress.  HEENT: Poor dentition. CV: RRR, no murmurs, rubs, or gallops Pulm: Normal respiratory effort on room air Abd: Soft, non-distended, non-tender.  Skin: Good skin turgor.  Ext: No LE edema Neuro: Verbalizes "ow" and withdraws to painful stimulus. Not alert, not oriented to self.  Psych: Unable to assess  Assessment/Plan:  Principal Problem:   Acute metabolic encephalopathy Active Problems:   Type 2 diabetes mellitus with neurological complications (HCC)   Dementia (HCC)   Bladder outlet obstruction   Controlled diabetes mellitus type 2 with complications ([place])   Hypernatremia   Moderate protein-calorie malnutrition (HCC)  Charles Fox is a 72 year old man with past medical history significant for T2DM, CVA, advanced dementia, and seizure on Depakote who presented for change in mental status and admitted  for treatment of acute metabolic encephalopathy and hypernatremia in the setting of dehydration.   Acute Metabolic Encephalopathy   Patient's remains minimally communicative and not oriented to self, although is more responsive compared to prior days this admission and verbalizes discomfort with painful stimulus. Patient has been NPO in setting of altered mental status, will have SLP evaluate today and will initiate tube feeding if determined to be unable to take PO while we continue to treat reversible causes.  -SLP evaluation -Delirium precautions  Hypernatremia   Patient's Na improved to 148 today, has remaining 2.8L of free water deficit. Will continue IVF and continue to monitor.  -D51/2NS @100mL$ /hr continuous -BMP  Electrolyte Abnormalities Hypomagnesemia Hypokalemia K+ 3.2, Mag 1.4. Will replete today and continue to monitor.  -IV Kcl 58mq x6 -IV Mag sulfate 2g x1  Aspiration Pneumonia Leukocytosis Patient's vitals have remained stable, patient will complete empiric antibiotics today.  -IV Unasyn 3g q6h (day 5/5)  T2DM   CBG this morning 192. Still holding home medications in setting of NPO, may restart long-acting insulin if patient starts diet or tube feeds.  -SSI, sensitive -Hold home metformin  -Hold home lantus 12 units  Hx  of HTN BP stable, will continue to monitor.  -Hold home irbesartan, carvedilol.   Hx of CVA -Continue home aspirin 110m per rectum -Hold home atorvastatin due to NPO status  Full Code, pending legal guardian to approve DNR paperwork.  Diet: NPO VTE: Enoxaparin IVF: D51/2NS @100mL$ /hr continuous Family Update: Spoke with friend KSantiago Gladyesterday    LOS: 4 days   NSpero Curb Medical Student 10/28/2022, 10:13 AM

## 2022-10-29 LAB — GLUCOSE, CAPILLARY
Glucose-Capillary: 127 mg/dL — ABNORMAL HIGH (ref 70–99)
Glucose-Capillary: 182 mg/dL — ABNORMAL HIGH (ref 70–99)
Glucose-Capillary: 158 mg/dL — ABNORMAL HIGH (ref 70–99)
Glucose-Capillary: 159 mg/dL — ABNORMAL HIGH (ref 70–99)
Glucose-Capillary: 165 mg/dL — ABNORMAL HIGH (ref 70–99)

## 2022-10-29 LAB — CULTURE, BLOOD (ROUTINE X 2)
Culture: NO GROWTH
Culture: NO GROWTH
Special Requests: ADEQUATE
Special Requests: ADEQUATE

## 2022-10-29 LAB — BASIC METABOLIC PANEL
Anion gap: 10 (ref 5–15)
BUN: 11 mg/dL (ref 8–23)
CO2: 30 mmol/L (ref 22–32)
Calcium: 7.8 mg/dL — ABNORMAL LOW (ref 8.9–10.3)
Chloride: 107 mmol/L (ref 98–111)
Creatinine, Ser: 0.69 mg/dL (ref 0.61–1.24)
GFR, Estimated: >60 mL/min (ref >60)
Glucose, Bld: 147 mg/dL — ABNORMAL HIGH (ref 70–99)
Potassium: 3.5 mmol/L (ref 3.5–5.1)
Sodium: 147 mmol/L — ABNORMAL HIGH (ref 135–145)

## 2022-10-29 LAB — PHOSPHORUS
Phosphorus: 1.4 mg/dL — ABNORMAL LOW (ref 2.5–4.6)
Phosphorus: 1.7 mg/dL — ABNORMAL LOW (ref 2.5–4.6)

## 2022-10-29 LAB — MAGNESIUM
Magnesium: 2 mg/dL (ref 1.7–2.4)
Magnesium: 2.1 mg/dL (ref 1.7–2.4)

## 2022-10-29 MED ORDER — VALPROIC ACID 250 MG/5ML PO SOLN
500.0000 mg | Freq: Two times a day (BID) | ORAL | Status: DC
  Administered 2022-10-29 – 2022-11-02 (×8): 500 mg
  Filled 2022-10-29 (×8): qty 10

## 2022-10-29 MED ORDER — DIVALPROEX SODIUM 500 MG PO DR TAB: 500.0000 mg | DELAYED_RELEASE_TABLET | Freq: Two times a day (BID) | ORAL | Status: DC

## 2022-10-29 MED ORDER — K PHOS MONO-SOD PHOS DI & MONO 155-852-130 MG PO TABS
250.0000 mg | ORAL_TABLET | Freq: Four times a day (QID) | ORAL | Status: AC
  Administered 2022-10-29 – 2022-10-30 (×3): 250 mg via NASOGASTRIC
  Filled 2022-10-29 (×4): qty 1

## 2022-10-29 NOTE — Progress Notes (Signed)
Dear Doctor:  This patient has been identified as a candidate for PICC for the following reason (s): drug pH or osmolality (causing phlebitis, infiltration in 24 hours) If you agree, please write an order for the indicated device. For any questions contact the Vascular Access Team at [phone] if no answer, please leave a message.  Thank you for supporting the early vascular access assessment program.

## 2022-10-29 NOTE — Progress Notes (Signed)
   Subjective:  Patient is lying comfortably in bed with eyes open. Withdraws to discomfort, but does not follow command.   Objective:  Vital signs in last 24 hours: Vitals:   10/28/22 1941 10/28/22 2000 10/28/22 2312 10/29/22 0400  BP: 93/76 (!) 141/78 133/79 137/74  Pulse: (!) 59 94 97 (!) 108  Resp: 19 (!) '21 19 20  '$ Temp: 98.3 F (36.8 C)  98.6 F (37 C)   TempSrc: Oral  Axillary   SpO2: 100% 100% 100% 98%   Weight change:   Intake/Output Summary (Last 24 hours) at 10/29/2022 0841 Last data filed at 10/29/2022 0347 Gross per 24 hour  Intake 230 ml  Output 800 ml  Net -570 ml   Physical Exam General: Awake. In no acute distress.  HEENT: NG tube in place  CV: RRR, no murmurs, rubs, or gallops Pulm: Normal respiratory effort on room air Abd: Soft, non-distended, non-tender. No rigidity or guarding.  Skin: Warm and dry Ext: No LE edema Neuro: Not alert or oriented. No verbal communication. Grimaces and withdraws to pain.  Psych: Unable to assess  Assessment/Plan:  Principal Problem:   Acute metabolic encephalopathy Active Problems:   Type 2 diabetes mellitus with neurological complications (HCC)   Dementia (HCC)   Bladder outlet obstruction   Controlled diabetes mellitus type 2 with complications ([place])   Hypernatremia   Moderate protein-calorie malnutrition (HCC)  ERSKINE STEINFELDT is a 72 year old man with past medical history significant for T2DM, CVA, advanced dementia, and seizure on Depakote who presented for change in mental status and admitted for treatment of acute metabolic encephalopathy and hypernatremia in the setting of dehydration.    Acute Metabolic Encephalopathy   Patient's mental status is unchanged. Tube feeds started yesterday to provide nutrition while continuing to correct reversible causes. Patient completed course of antibiotics for possible aspiration pneumonia and we are continuing to correct hypernatremia. Will continue provide nutrition and  reevaluate for mental status improvement over the weekend. If no improvement by Monday, recommend transition to comfort care measures to legal guardian.  -Glucerna 63m/hr continuous per tube -Free water 2068mq4h per tube  Hypernatremia  Na 147 today from 148 yesterday. Free water deficit 2.5L. Will continue to correct hypernatremia through free water per tube.  -Free water 20041mer tube q4h  Electrolyte Abnormalities Hypomagnesemia - resolved Hypokalemia - resolved Hypophosphatemia Mag 2.0, K+2.5. Phos 1.4.  -K Phos per tube  T2DM   CBG this morning 127. Will continue to monitor.   -SSI, sensitive -Hold home metformin  -Hold home lantus 12 units  Aspiration Pneumonia - Antibiotics completed Leukocytosis Patient completed 5-day course of IV Unasyn yesterday. Patient afebrile with no signs or sx of pneumonia.    Hx of HTN BP stable, will continue to monitor.  -Hold home irbesartan, carvedilol.    Hx of CVA -Continue home aspirin '150mg'$  per rectum -Hold home atorvastatin due to NPO status  Full Code, pending legal guardian to approve DNR paperwork.  Diet: NPO VTE: Enoxaparin IVF: D51/2NS '@100mL'$ /hr continuous Family Update: None today    LOS: 5 days   NelSpero Curbedical Student 10/29/2022, 8:41 AM

## 2022-10-30 DIAGNOSIS — E870 Hyperosmolality and hypernatremia: Secondary | ICD-10-CM | POA: Diagnosis not present

## 2022-10-30 DIAGNOSIS — G9341 Metabolic encephalopathy: Secondary | ICD-10-CM | POA: Diagnosis not present

## 2022-10-30 LAB — GLUCOSE, CAPILLARY
Glucose-Capillary: 168 mg/dL — ABNORMAL HIGH (ref 70–99)
Glucose-Capillary: 170 mg/dL — ABNORMAL HIGH (ref 70–99)
Glucose-Capillary: 178 mg/dL — ABNORMAL HIGH (ref 70–99)
Glucose-Capillary: 179 mg/dL — ABNORMAL HIGH (ref 70–99)
Glucose-Capillary: 202 mg/dL — ABNORMAL HIGH (ref 70–99)
Glucose-Capillary: 205 mg/dL — ABNORMAL HIGH (ref 70–99)

## 2022-10-30 LAB — BASIC METABOLIC PANEL
Anion gap: 9 (ref 5–15)
BUN: 18 mg/dL (ref 8–23)
CO2: 30 mmol/L (ref 22–32)
Calcium: 7.9 mg/dL — ABNORMAL LOW (ref 8.9–10.3)
Chloride: 112 mmol/L — ABNORMAL HIGH (ref 98–111)
Creatinine, Ser: 0.74 mg/dL (ref 0.61–1.24)
GFR, Estimated: >60 mL/min (ref >60)
Glucose, Bld: 195 mg/dL — ABNORMAL HIGH (ref 70–99)
Potassium: 4.1 mmol/L (ref 3.5–5.1)
Sodium: 151 mmol/L — ABNORMAL HIGH (ref 135–145)

## 2022-10-30 LAB — MAGNESIUM
Magnesium: 2.2 mg/dL (ref 1.7–2.4)
Magnesium: 2.3 mg/dL (ref 1.7–2.4)

## 2022-10-30 LAB — PHOSPHORUS
Phosphorus: 2 mg/dL — ABNORMAL LOW (ref 2.5–4.6)
Phosphorus: 2.1 mg/dL — ABNORMAL LOW (ref 2.5–4.6)

## 2022-10-30 MED ORDER — FREE WATER
250.0000 mL | Status: DC
  Administered 2022-10-30 – 2022-11-01 (×11): 250 mL

## 2022-10-30 MED ORDER — INSULIN GLARGINE-YFGN 100 UNIT/ML ~~LOC~~ SOLN
10.0000 [IU] | Freq: Every day | SUBCUTANEOUS | Status: DC
  Administered 2022-10-30 – 2022-10-31 (×2): 10 [IU] via SUBCUTANEOUS
  Filled 2022-10-30 (×2): qty 0.1

## 2022-10-30 MED ORDER — ACETAMINOPHEN 325 MG PO TABS
650.0000 mg | ORAL_TABLET | Freq: Four times a day (QID) | ORAL | Status: DC | PRN
  Administered 2022-10-30: 650 mg
  Filled 2022-10-30: qty 2

## 2022-10-30 NOTE — Progress Notes (Signed)
HD#6 SUBJECTIVE:  Patient Summary: Charles Fox is a 72 y.o. with a pertinent PMH of T2DM, CVA, advanced dementia, and seizure, who presented with altered mental status and admitted for acute metabolic encephalopathy and progression of dementia.   Overnight Events: no events overnight  Interim History: The patient was somnolent this morning and not interactive upon our exam. Still not following any commands.   OBJECTIVE:  Vital Signs: Vitals:   10/29/22 1552 10/29/22 2000 10/29/22 2200 10/30/22 0400  BP: (!) 142/86 (!) 140/75  (!) 155/79  Pulse: 99 (!) 102 (!) 104 (!) 109  Resp: 18 (!) 22 18 19  $ Temp: 98.9 F (37.2 C)   98.8 F (37.1 C)  TempSrc: Oral   Oral  SpO2: 100% 100% 100% 99%  Weight:       Supplemental O2: Nasal Cannula SpO2: 99 % O2 Flow Rate (L/min): 2 L/min  Filed Weights   10/29/22 1400  Weight: 98.3 kg     Intake/Output Summary (Last 24 hours) at 10/30/2022 0639 Last data filed at 10/30/2022 0425 Gross per 24 hour  Intake 505 ml  Output 825 ml  Net -320 ml   Net IO Since Admission: -6,777.31 mL [10/30/22 0639]  Physical Exam: General: Chronically ill appearing male. No acute distress. Pulmonary: Normal effort Extremities: No LE edema. Skin: Warm and dry. Slightly reduced skin turgor Neuro: Somnolent. Not following any commands    Patient Lines/Drains/Airways Status     Active Line/Drains/Airways     Name Placement date Placement time Site Days   Urethral Catheter [place] RN Double-lumen 16 Fr. 10/25/22  [id number]  Double-lumen  5   Small Bore Feeding Tube 10 Fr. Left nare Marking at nare/corner of mouth 64 cm 10/28/22  1245  Left nare  2   Pressure Injury 08/20/22 Buttocks Medial Stage 2 -  Partial thickness loss of dermis presenting as a shallow open injury with a red, pink wound bed without slough. 08/20/22  0800  -- 71   Wound / Incision (Open or Dehisced) 08/08/22 Laceration Lip Upper 08/08/22  1530  Lip  83   Wound / Incision (Open or  Dehisced) 08/16/22 Skin tear Buttocks Left 08/16/22  2130  Buttocks  75             ASSESSMENT/PLAN:  Assessment: Principal Problem:   Acute metabolic encephalopathy Active Problems:   Type 2 diabetes mellitus with neurological complications (HCC)   Dementia (HCC)   Bladder outlet obstruction   Controlled diabetes mellitus type 2 with complications (HCC)   Hypernatremia   Moderate protein-calorie malnutrition ([place])   Plan: Acute metabolic encephalopathy  Hypernatremia Mental status remains unchanged this morning - the patient only awakes to painful stimuli and does not follow any commands. Na is increased from 147 yesterday to 151 today, plan to increase his free water. Of note, we recommended to the legal guardian that the patient's code status switch to DNR, as any life-prolonging measures would not provide any quality of life for this patient. Form was sent in on Thursday, 2/8, however, the legal guardian needs to get this approved by her supervisor. Patient remains a full code until then, and additionally, we will need to have ongoing [place] conversations and need to re-address comfort care, if the patient does not improve by tomorrow.  - Continue NG tube for nutrition for now - Increase free water to 250 mL q4h  - If no improvement by tomorrow, call legal guardian to discuss transition to comfort care (patient's  dementia is terminal)  T2DM   CBG this morning 170. Will restart long acting insulin now that the patient is receiving tube feeds.  - SSI, sensitive - Hold home metformin  - Restart lantus, 10u daily   Aspiration Pneumonia - Antibiotics completed Leukocytosis Patient completed 5-day course of IV Unasyn. Patient afebrile with no signs or sx of pneumonia.    Hx of HTN BP stable, will continue to monitor.  -Hold home irbesartan, carvedilol.    Hx of CVA -Continue home aspirin 179m per rectum -Hold home atorvastatin due to NPO status  Best Practice: Diet: Tube  feeds IVF: Fluids: Free water , Rate:  250 cc q4h VTE: enoxaparin (LOVENOX) injection 40 mg Start: 10/25/22 1000 Code: Full - DNR form faxed to legal guardian AB: none Family Contact: Legal guardian, AVincente Liberty to be notified. DISPO: Anticipated discharge pending  medical stability and [place] .  Signature: RBuddy Duty D.O.  Internal Medicine Resident, PGY-2 [facility]  Pager: #[phone]:39 AM, 10/30/2022   Please contact the on call pager after 5 pm and on weekends at [phone]

## 2022-10-31 DIAGNOSIS — G9341 Metabolic encephalopathy: Secondary | ICD-10-CM | POA: Diagnosis not present

## 2022-10-31 DIAGNOSIS — F0280 Dementia in other diseases classified elsewhere without behavioral disturbance: Secondary | ICD-10-CM

## 2022-10-31 DIAGNOSIS — E870 Hyperosmolality and hypernatremia: Secondary | ICD-10-CM | POA: Diagnosis not present

## 2022-10-31 LAB — BASIC METABOLIC PANEL
Anion gap: 8 (ref 5–15)
BUN: 20 mg/dL (ref 8–23)
CO2: 33 mmol/L — ABNORMAL HIGH (ref 22–32)
Calcium: 7.9 mg/dL — ABNORMAL LOW (ref 8.9–10.3)
Chloride: 109 mmol/L (ref 98–111)
Creatinine, Ser: 0.79 mg/dL (ref 0.61–1.24)
GFR, Estimated: >60 mL/min (ref >60)
Glucose, Bld: 216 mg/dL — ABNORMAL HIGH (ref 70–99)
Potassium: 4.1 mmol/L (ref 3.5–5.1)
Sodium: 150 mmol/L — ABNORMAL HIGH (ref 135–145)

## 2022-10-31 LAB — GLUCOSE, CAPILLARY
Glucose-Capillary: 146 mg/dL — ABNORMAL HIGH (ref 70–99)
Glucose-Capillary: 181 mg/dL — ABNORMAL HIGH (ref 70–99)
Glucose-Capillary: 182 mg/dL — ABNORMAL HIGH (ref 70–99)
Glucose-Capillary: 204 mg/dL — ABNORMAL HIGH (ref 70–99)
Glucose-Capillary: 207 mg/dL — ABNORMAL HIGH (ref 70–99)
Glucose-Capillary: 209 mg/dL — ABNORMAL HIGH (ref 70–99)
Glucose-Capillary: 216 mg/dL — ABNORMAL HIGH (ref 70–99)

## 2022-10-31 LAB — PHOSPHORUS: Phosphorus: 1.9 mg/dL — ABNORMAL LOW (ref 2.5–4.6)

## 2022-10-31 MED ORDER — INSULIN GLARGINE-YFGN 100 UNIT/ML ~~LOC~~ SOLN
15.0000 [IU] | Freq: Every day | SUBCUTANEOUS | Status: DC
  Administered 2022-11-01 – 2022-11-02 (×2): 15 [IU] via SUBCUTANEOUS
  Filled 2022-10-31 (×2): qty 0.15

## 2022-10-31 MED ORDER — ACETAMINOPHEN 325 MG PO TABS
650.0000 mg | ORAL_TABLET | Freq: Four times a day (QID) | ORAL | Status: DC | PRN
  Administered 2022-10-31 – 2022-11-02 (×2): 650 mg
  Filled 2022-10-31 (×2): qty 2

## 2022-10-31 MED ORDER — K PHOS MONO-SOD PHOS DI & MONO 155-852-130 MG PO TABS
250.0000 mg | ORAL_TABLET | Freq: Four times a day (QID) | ORAL | Status: AC
  Administered 2022-10-31 – 2022-11-01 (×4): 250 mg via NASOGASTRIC
  Filled 2022-10-31 (×4): qty 1

## 2022-10-31 NOTE — Progress Notes (Signed)
Subjective:  Patient was unresponsive this morning, moved his right arm slightly to painful tactile stimulus.    Objective: Vitals over previous 24hr: Vitals:   10/31/22 0000 10/31/22 0400 10/31/22 0550 10/31/22 0800  BP: (!) 140/74 (!) 149/81  (!) 155/86  Pulse: (!) 103 (!) 101  (!) 104  Resp: 18 18  20  $ Temp: 99.2 F (37.3 C) 98.9 F (37.2 C)  100.3 F (37.9 C)  TempSrc: Axillary Axillary  Axillary  SpO2: 98% 100% 100%   Weight:        General:      obtunded, lying in bed Skin:       warm and dry, intact without any obvious lesions or scars, no rashes Lungs:      normal respiratory effort, breathing unlabored, symmetrical chest rise, no crackles or wheezing Cardiac:      sinus tachycardia, normal S1 and S2, no pitting edema Abdomen:      soft and non-distended, normoactive bowel sounds present in all four quadrants Neurologic:      oriented to zero    Assessment/Plan: Charles Fox is a 72 year old male with a past medical history of diabetes, CVA, advanced dementia, and seizure who presented with altered mental status, now admitted for acute metabolic encephalopathy in setting of progressive dementia.    ---Acute metabolic encephalopathy ---Hypovolemic hypernatremia ---Dementia Patient sent from a SNF [facility] ED for declining functional status on 2-5. Baseline cognition had decreased after recent hospitalization for sepsis that required intubation and never improved, which is consistent with progressive dementia. Upon arrival, laboratory testing notable for severe hypernatremia secondary to poor oral intake and chronic dehydration. Intravenous fluids were started and have been replaced with free water boluses via nasogastric tube. Throughout current hospitalization, the patient has remained minimally responsive only to painful tactile stimuli and cannot follow commands. Primary team has contacted legal guardian and will need to continue goals of care conversation,  specifically comfort measures. Current free water deficit is 3.5L.  > Glucerna feeding supplement via nasogastric tube > Free water 249m q4 via nasogastric tube > Trend BMP q24 > Contact legal guardian for goals of care discussion   ---Diabetes II Patient has history of diabetes managed at home with metformin and glargine 10U. Metformin has been held throughout current hospitalization. Glargine increased from 10U to 15U on 2-12 for elevated glucose levels. > Insulin glargine 15U q24 > Insulin sliding scale, sensitive sensitivity > Trend CBG q4   ---Aspiration pneumonia, now resolved Patient presented with aspiration pneumonia identified with left lower lobe infiltrate identified via chest radiograph. He has completed a five-day course of ampicillin-sulbactam. Breathing on exam is unlabored and SpO2 values have been 96-100%. > Monitor clinically   ---Hypertension Patient has history of hypertension managed at home with losartan and irbesartan. Blood pressure has been stable throughout current hospitalization. > Hold home losartan and irbesartan   ---Cerebrovascular accident Patient has a history of lacunar cerebrovascular vascular accident sustained in 05-2020. He takes aspirin and atorvastatin at home. > Aspirin [id number] > Home atorvastatin given nil per os status    Principal Problem:   Acute metabolic encephalopathy Active Problems:   Type 2 diabetes mellitus with neurological complications (HCC)   Dementia (HCC)   Bladder outlet obstruction   Cont[place] diabetes mellitus [place] 2 with complications [place]  Hypernatremia   Moderate protein-calorie malnutrition ([place]   Prior to Admission [facility] Pending Barriers to Discharge: clinical improvement Dispo:  Anticipated discharge in approximately 4-5 day(s).    Charles Nickel, MD Internal Medicine PGY-1 Pager [phone]  After 5pm on weekdays and 1pm on  weekends: On Call pager [phone]

## 2022-11-01 DIAGNOSIS — F0280 Dementia in other diseases classified elsewhere without behavioral disturbance: Secondary | ICD-10-CM | POA: Diagnosis not present

## 2022-11-01 DIAGNOSIS — E870 Hyperosmolality and hypernatremia: Secondary | ICD-10-CM | POA: Diagnosis not present

## 2022-11-01 DIAGNOSIS — G9341 Metabolic encephalopathy: Secondary | ICD-10-CM | POA: Diagnosis not present

## 2022-11-01 LAB — BASIC METABOLIC PANEL
Anion gap: 8 (ref 5–15)
Anion gap: 6 (ref 5–15)
BUN: 19 mg/dL (ref 8–23)
BUN: 16 mg/dL (ref 8–23)
CO2: 34 mmol/L — ABNORMAL HIGH (ref 22–32)
CO2: 34 mmol/L — ABNORMAL HIGH (ref 22–32)
Calcium: 8 mg/dL — ABNORMAL LOW (ref 8.9–10.3)
Calcium: 7.8 mg/dL — ABNORMAL LOW (ref 8.9–10.3)
Chloride: 110 mmol/L (ref 98–111)
Chloride: 111 mmol/L (ref 98–111)
Creatinine, Ser: 0.73 mg/dL (ref 0.61–1.24)
Creatinine, Ser: 0.66 mg/dL (ref 0.61–1.24)
GFR, Estimated: >60 mL/min (ref >60)
GFR, Estimated: >60 mL/min (ref >60)
Glucose, Bld: 175 mg/dL — ABNORMAL HIGH (ref 70–99)
Glucose, Bld: 97 mg/dL (ref 70–99)
Potassium: 3.9 mmol/L (ref 3.5–5.1)
Potassium: 3.8 mmol/L (ref 3.5–5.1)
Sodium: 152 mmol/L — ABNORMAL HIGH (ref 135–145)
Sodium: 151 mmol/L — ABNORMAL HIGH (ref 135–145)

## 2022-11-01 LAB — GLUCOSE, CAPILLARY
Glucose-Capillary: 165 mg/dL — ABNORMAL HIGH (ref 70–99)
Glucose-Capillary: 177 mg/dL — ABNORMAL HIGH (ref 70–99)
Glucose-Capillary: 158 mg/dL — ABNORMAL HIGH (ref 70–99)
Glucose-Capillary: 92 mg/dL (ref 70–99)
Glucose-Capillary: 116 mg/dL — ABNORMAL HIGH (ref 70–99)

## 2022-11-01 LAB — PHOSPHORUS: Phosphorus: 2.1 mg/dL — ABNORMAL LOW (ref 2.5–4.6)

## 2022-11-01 MED ORDER — K PHOS MONO-SOD PHOS DI & MONO 155-852-130 MG PO TABS: 250.0000 mg | ORAL_TABLET | Freq: Four times a day (QID) | ORAL | Status: DC

## 2022-11-01 MED ORDER — K PHOS MONO-SOD PHOS DI & MONO 155-852-130 MG PO TABS
250.0000 mg | ORAL_TABLET | Freq: Four times a day (QID) | ORAL | Status: DC
  Administered 2022-11-01 – 2022-11-02 (×3): 250 mg
  Filled 2022-11-01 (×3): qty 1

## 2022-11-01 MED ORDER — FREE WATER
200.0000 mL | Status: DC
  Administered 2022-11-01 – 2022-11-02 (×12): 200 mL

## 2022-11-01 NOTE — IPAL (Signed)
Spoke with patient's legal guardian, Creig Hines, in regards to patient's poor prognosis. She has sent approval to change CODE status to DNR/DNI. Further discussed consideration of transitioning him to a more comfort-focused care approach to avoid suffering and maintain dignity near the end of life. We will consult palliative care to evaluate patient and provide recommendations as to best next steps as legal guardian would like for a second opinion. Given his irreversible dementia, I do think that he would benefit from a comfort-focused care approach.  -changed CODE status to DNR/DNI -consult to palliative care

## 2022-11-01 NOTE — Progress Notes (Signed)
Subjective:  Patient had mild fever to 100.4 overnight.   Patient lying in bed comfortably this morning in no acute distress, does not respond to voice.   Objective:  Vital signs in last 24 hours: Vitals:   10/31/22 1700 10/31/22 2057 10/31/22 2340 11/01/22 0307  BP: (!) 140/74 (!) 164/80 (!) 144/74 (!) 162/81  Pulse: (!) 105 93 95 (!) 102  Resp:      Temp: 100.1 F (37.8 C) 99 F (37.2 C) (!) 100.4 F (38 C) 98.9 F (37.2 C)  TempSrc: Axillary Axillary Axillary Axillary  SpO2: 96% 97% 97% 98%  Weight:       Weight change:   Intake/Output Summary (Last 24 hours) at 11/01/2022 0713 Last data filed at 11/01/2022 0500 Gross per 24 hour  Intake --  Output 775 ml  Net -775 ml      Latest Ref Rng & Units 11/01/2022    2:49 AM 10/31/2022    4:19 AM 10/30/2022    5:26 AM  CMP  Glucose 70 - 99 mg/dL 175  216  195   BUN 8 - 23 mg/dL 19  20  18   $ Creatinine 0.61 - 1.24 mg/dL 0.73  0.79  0.74   Sodium 135 - 145 mmol/L 152  150  151   Potassium 3.5 - 5.1 mmol/L 3.9  4.1  4.1   Chloride 98 - 111 mmol/L 110  109  112   CO2 22 - 32 mmol/L 34  33  30   Calcium 8.9 - 10.3 mg/dL 8.0  7.9  7.9     Physical Exam  General: Not alert, will open eyes intermittently. In no acute distress.  HEENT: NG tube in place Pulm: Normal respiratory effort on room air.  Abd: Soft, non-tender, non-distended. No rigidity or guarding.  Skin: Warm and dry. Good skin turgor.  Ext: No LE edema.  Neuro: Not alert or oriented. Will grimace to painful stimulus.  Psych: Cannot assess  Assessment/Plan:  Principal Problem:   Acute metabolic encephalopathy Active Problems:   Type 2 diabetes mellitus with neurological complications (HCC)   Dementia (HCC)   Bladder outlet obstruction   Controlled diabetes mellitus type 2 with complications ([place])   Hypernatremia   Moderate protein-calorie malnutrition (HCC)  Charles Fox is a 72 year old man with past medical history significant for T2DM, CVA,  advanced dementia, and seizures admitted for treatment of acute metabolic encephalopathy in the setting of progressive dementia.   Acute Metabolic Encephalopathy Hx of Alzheimer's Dementia Hypernatremia Hypernatremia worsened 150>>152 today despite free water flushes via NG. Total free water deficit 4.3L, will continue to treat hypernatremia and increase free water flushes, reassess later today and continue regimen overnight if no improvement in sodium. Overall patient has not made any meaningful improvement towards his baseline mental status since admission despite holding centrally-acting medications, completing prophylactic antibiotics for likely pneumonia, and improvement of hypernatremia. He is still is unable to engage on exam and only withdraws/grimaces to pain. Spoke with patient's legal guardian Vincente Liberty, who approved DNR/DNI. Legal guardian requested Palliative Care to share their recommendations before moving forward with comfort care. Will continue to treat supportively in the meantime.  -Palliative Care consult, share recommendations with patient's legal guardian -Glucerna 59m/hr continuous per tube  -2016mfree water q2h via NG -Trend BMP  Aspiration Pneumonia Patient completed 5-day course of IV Unasyn. Patient had low grade fever to 100.4 overnight. Can consider blood cultures/infectious workup if patient refevers, but his overall prognosis  in the setting of his encephalopathy is poor and workup is unlikely to benefit him especially as we are considering transitioning to comfort care.   T2DM    CBG 175 this morning.  - Semglee 15 units daily - SSI, sensitive - Hold home metformin   Hx of HTN BP stable, will continue to monitor.  -Hold home irbesartan, carvedilol.    Hx of CVA -Continue home aspirin 134m per rectum -Hold home atorvastatin due to NPO status  DNR/DNI Diet: TF @60$  mL/hr VTE: Lovenox 423mIVF: None   LOS: 8 days   NeSpero CurbMedical  Student 11/01/2022, 7:13 AM

## 2022-11-02 ENCOUNTER — Inpatient Hospital Stay (HOSPITAL_COMMUNITY): Payer: Medicare Other

## 2022-11-02 DIAGNOSIS — G309 Alzheimer's disease, unspecified: Secondary | ICD-10-CM

## 2022-11-02 DIAGNOSIS — G9341 Metabolic encephalopathy: Secondary | ICD-10-CM | POA: Diagnosis not present

## 2022-11-02 DIAGNOSIS — J690 Pneumonitis due to inhalation of food and vomit: Secondary | ICD-10-CM | POA: Diagnosis not present

## 2022-11-02 DIAGNOSIS — Z515 Encounter for palliative care: Secondary | ICD-10-CM

## 2022-11-02 DIAGNOSIS — F02B11 Dementia in other diseases classified elsewhere, moderate, with agitation: Secondary | ICD-10-CM

## 2022-11-02 DIAGNOSIS — E870 Hyperosmolality and hypernatremia: Secondary | ICD-10-CM | POA: Diagnosis not present

## 2022-11-02 LAB — GLUCOSE, CAPILLARY
Glucose-Capillary: 176 mg/dL — ABNORMAL HIGH (ref 70–99)
Glucose-Capillary: 180 mg/dL — ABNORMAL HIGH (ref 70–99)
Glucose-Capillary: 180 mg/dL — ABNORMAL HIGH (ref 70–99)
Glucose-Capillary: 235 mg/dL — ABNORMAL HIGH (ref 70–99)

## 2022-11-02 LAB — CBC
HCT: 31.1 % — ABNORMAL LOW (ref 39.0–52.0)
Hemoglobin: 9.6 g/dL — ABNORMAL LOW (ref 13.0–17.0)
MCH: 30.1 pg (ref 26.0–34.0)
MCHC: 30.9 g/dL (ref 30.0–36.0)
MCV: 97.5 fL (ref 80.0–100.0)
Platelets: 369 10*3/uL (ref 150–400)
RBC: 3.19 MIL/uL — ABNORMAL LOW (ref 4.22–5.81)
RDW: 15.8 % — ABNORMAL HIGH (ref 11.5–15.5)
WBC: 14.8 10*3/uL — ABNORMAL HIGH (ref 4.0–10.5)
nRBC: 0.1 % (ref 0.0–0.2)

## 2022-11-02 LAB — BASIC METABOLIC PANEL
Anion gap: 8 (ref 5–15)
BUN: 18 mg/dL (ref 8–23)
CO2: 32 mmol/L (ref 22–32)
Calcium: 8 mg/dL — ABNORMAL LOW (ref 8.9–10.3)
Chloride: 106 mmol/L (ref 98–111)
Creatinine, Ser: 0.67 mg/dL (ref 0.61–1.24)
GFR, Estimated: >60 mL/min (ref >60)
Glucose, Bld: 204 mg/dL — ABNORMAL HIGH (ref 70–99)
Potassium: 4.1 mmol/L (ref 3.5–5.1)
Sodium: 146 mmol/L — ABNORMAL HIGH (ref 135–145)

## 2022-11-02 LAB — PHOSPHORUS: Phosphorus: 1.9 mg/dL — ABNORMAL LOW (ref 2.5–4.6)

## 2022-11-02 MED ORDER — FREE WATER: 250.0000 mL | Status: DC

## 2022-11-02 MED ORDER — SODIUM CHLORIDE 0.9 % IV SOLN
2.0000 g | Freq: Three times a day (TID) | INTRAVENOUS | Status: DC
  Administered 2022-11-02: 2 g via INTRAVENOUS
  Filled 2022-11-02: qty 12.5

## 2022-11-02 MED ORDER — LACTATED RINGERS IV BOLUS
500.0000 mL | Freq: Once | INTRAVENOUS | Status: AC
  Administered 2022-11-02: 500 mL via INTRAVENOUS

## 2022-11-02 MED ORDER — ASPIRIN 81 MG PO CHEW
81.0000 mg | CHEWABLE_TABLET | Freq: Every day | ORAL | Status: DC
  Administered 2022-11-02: 81 mg via NASOGASTRIC
  Filled 2022-11-02: qty 1

## 2022-11-02 MED ORDER — VANCOMYCIN HCL 1250 MG/250ML IV SOLN: 1250.0000 mg | Freq: Two times a day (BID) | INTRAVENOUS | Status: DC

## 2022-11-02 MED ORDER — SODIUM CHLORIDE 0.9 % IV SOLN
3.0000 g | Freq: Four times a day (QID) | INTRAVENOUS | Status: DC
  Administered 2022-11-02: 3 g via INTRAVENOUS
  Filled 2022-11-02: qty 8

## 2022-11-02 MED ORDER — VANCOMYCIN HCL 2000 MG/400ML IV SOLN
2000.0000 mg | Freq: Once | INTRAVENOUS | Status: AC
  Administered 2022-11-02: 2000 mg via INTRAVENOUS
  Filled 2022-11-02: qty 400

## 2022-11-02 MED ORDER — LACTATED RINGERS IV BOLUS: 500.0000 mL | Freq: Once | INTRAVENOUS | Status: DC

## 2022-11-02 MED ORDER — LORAZEPAM 2 MG/ML IJ SOLN: 1.0000 mg | INTRAMUSCULAR | Status: DC | PRN

## 2022-11-02 MED ORDER — MORPHINE SULFATE (PF) 2 MG/ML IV SOLN
1.0000 mg | INTRAVENOUS | Status: DC | PRN
  Administered 2022-11-02 – 2022-11-03 (×3): 1 mg via INTRAVENOUS
  Filled 2022-11-02 (×3): qty 1

## 2022-11-02 NOTE — Consult Note (Signed)
Consultation Note Date: 11/02/2022   Patient Name: Charles Fox  DOB: [birth year]  MRN: [id number]  Age / Sex: 72 y.o., male  PCP: Pcp, No Referring Physician: Aldine Contes, MD  Reason for Consultation: Establishing goals of care  HPI/Patient Profile: 72 y.o. male  admitted on 11/12/2022 with  person living with  dementia in a skilled nursing facility sent to the emergency department with declining functional status and admitted treatment and stabilization  of severe hypernatremia.   PMH significant for dementia, diabetes, bladder outlet obstruction.  Continued overall failure to thrive, today is day 8 of this hospitalization.  Patient without medical capacity at this time, legal decision maker Jesusita Oka faces treatment option decisions, advanced directive decisions and anticipatory care needs.    Clinical Assessment and Goals of Care:  This NP Wadie Lessen reviewed medical records, received report from team, assessed the patient and spoke to legal guardian/ Kem Kays  to discuss diagnosis, prognosis, GOC, EOL wishes disposition and options.   Concept of Palliative Care was introduced as specialized medical care for people and their families living with serious illness.  If focuses on providing relief from the symptoms and stress of a serious illness.  The goal is to improve quality of life for both the patient and the family.    Values and goals of care important to patient  were attempted to be elicited.   Education offered regarding current medical situation.  Patient continues to decline and show no meaningful improvement within the context of full medical support.  On assessment patient appears to be transitioning at end-of-life     A  discussion was had today regarding advanced directives.    Concepts specific to code status, artifical feeding and hydration, continued IV  antibiotics and rehospitalization was had.    The difference between an aggressive medical intervention path  and a palliative comfort care path for this patient at this time was had.   Education offered on the details of a comfort path specific to; DNR/DNI, no artificial feeding or hydration now or in the future, discontinue diagnostics and lab drawls, medication to provide comfort.  Focus of care is comfort and dignity allowing for natural death    Natural trajectory and expectations at EOL were discussed.  Questions and concerns addressed.   An email was sent to legal guardian Creig Hines and attending team.  Received communication for guardian is in agreement to shift to a comfort approach.   PMT will continue to support holistically.          Patient has court appointed guardian with Department of Social Services/Adrienne Grandville Silos       SUMMARY OF RECOMMENDATIONS    Code Status/Advance Care Planning: DNR Comfort and dignity are focus of care allowing for natural death.   Symptom Management:  Pain/dyspnea: Morphine 1 mg IV every 1 hour as needed Agitation: Ativan 1 mg IV every 4 hours as needed  Palliative Prophylaxis:  Aspiration, Delirium Protocol, Frequent Pain Assessment, and Oral Care  Additional Recommendations (  Limitations, Scope, Preferences): Full Comfort Care  Psycho-social/Spiritual:  Desire for further Chaplaincy support:no Additional Recommendations: Education on Hospice  Prognosis:  Hours - Days  Discharge Planning: To Be Determined      Primary Diagnoses: Present on Admission:  Acute metabolic encephalopathy  Type 2 diabetes mellitus with neurological complications (HCC)  Dementia (HCC)  Controlled diabetes mellitus type 2 with complications (HCC)  Hypernatremia  Moderate protein-calorie malnutrition ([place])  Bladder outlet obstruction   I have reviewed the medical record, interviewed the patient and family, and examined the  patient. The following aspects are pertinent.  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[facility])    Brain MRI  03/2021: small remote right cerebellar lacunar infarct   Pain, dental 11/20/2019   Skin lesions, generalized 05/09/2018   Tendinopathy of left rotator cuff 04/29/2019   Social History   Socioeconomic History   Marital status: Widowed    Spouse name: Not on file   Number of children: 1   Years of education: 12   Highest education level: High school graduate  Occupational History   Not on file  Tobacco Use   Smoking status: Former    Types: Cigarettes   Smokeless tobacco: Never  Vaping Use   Vaping Use: Never used  Substance and Sexual Activity   Alcohol use: Not on file   Drug use: Not Currently   Sexual activity: Not Currently  Other Topics Concern   Not on file  Social History Narrative   ** Merged History Encounter **       Food insecurities - referred to [employer] for 7 diabetic, low sodium meals per week, for a total of 12 weeks, beginning on Friday, July 31st.  Patient was also provided [facility].  Patient continues to states has no    family, friends or support system.  States he has 3 children but does not know how to contact them.  Was able to provide the following information today Son- Sharyn Blitz lives in [place] lives in [place] lives in [place] Does not associate with neighbors Goes to [employer] on [address]. Each Wed.  But states has no friends there   Social Determinants of Health   Financial Resource Strain: High Risk (04/17/2019)   Overall Financial Resource Strain (CARDIA)    Difficulty of Paying Living Expenses: Very hard  Food Insecurity: No Food Insecurity  (08/29/2022)   Hunger Vital Sign    Worried About Running Out of Food in the Last Year: Never true    Ran Out of Food in the Last Year: Never true  Transportation Needs: No Transportation Needs (08/29/2022)   PRAPARE - Hydrologist (Medical): No    Lack of Transportation (Non-Medical): No  Physical Activity: Insufficiently Active (04/17/2019)   Exercise Vital Sign    Days of Exercise per Week: 3 days    Minutes of Exercise per Session: 30 min  Stress: No Stress Concern Present (04/17/2019)   [place]    Feeling of Stress : Only a little  Social Connections: Socially Isolated (04/17/2019)   Social  Connection and Isolation Panel [NHANES]    Frequency of Communication with Friends and Family: Never    Frequency of Social Gatherings with Friends and Family: Never    Attends Religious Services: Never    Marine scientist or Organizations: No    Attends Archivist Meetings: Never    Marital Status: Widowed   No family history on file. Scheduled Meds:  aspirin  150 mg Rectal Daily   Chlorhexidine Gluconate Cloth  6 each Topical Daily   enoxaparin (LOVENOX) injection  40 mg Subcutaneous Q24H   free water  250 mL Per Tube Q4H   insulin aspart  0-9 Units Subcutaneous Q4H   insulin glargine-yfgn  15 Units Subcutaneous Daily   phosphorus  250 mg Per Tube QID   valproic acid  500 mg Per Tube BID   Continuous Infusions:  ceFEPime (MAXIPIME) IV 2 g (11/02/22 0625)   feeding supplement (GLUCERNA 1.5 CAL) 60 mL/hr at 10/30/22 1930   vancomycin     vancomycin 2,000 mg (11/02/22 0710)   PRN Meds:.acetaminophen Medications Prior to Admission:  Prior to Admission medications   Medication Sig Start Date End Date Taking? Authorizing Provider  amLODipine (NORVASC) 5 MG tablet Take 5 mg by mouth daily.   Yes [provider]  aspirin EC 81 MG tablet Take 1 tablet (81 mg total) by  mouth daily. Swallow whole. 10/14/20  Yes [name], MD  atorvastatin (LIPITOR) 40 MG tablet Take 1 tablet (40 mg total) by mouth daily. 10/14/20  Yes [name], MD  carvedilol (COREG) 3.125 MG tablet Take 1 tablet (3.125 mg total) by mouth 2 (two) times daily with a meal. Patient taking differently: Take 6.25 mg by mouth 2 (two) times daily with a meal. 09/24/21  Yes Autry-Lott, Naaman Plummer, DO  cefTRIAXone (ROCEPHIN) 1 g injection Inject 1 g into the muscle every 12 (twelve) hours.   Yes [provider]  divalproex (DEPAKOTE) 500 MG DR tablet Take 1 tablet (500 mg total) by mouth 2 (two) times daily. 09/11/22  Yes Pattricia Boss, MD  finasteride (PROSCAR) 5 MG tablet Take 1 tablet (5 mg total) by mouth daily. 09/24/21  Yes Autry-Lott, Naaman Plummer, DO  furosemide (LASIX) 40 MG tablet Take 40 mg by mouth daily.   Yes [provider]  ipratropium-albuterol (DUONEB) 0.5-2.5 (3) MG/3ML SOLN Take 3 mLs by nebulization every 4 (four) hours as needed. Patient taking differently: Take 3 mLs by nebulization every 4 (four) hours as needed (for COPD). 08/29/22  Yes Bonnielee Haff, MD  melatonin 5 MG TABS Take 1 tablet (5 mg total) by mouth at bedtime. 09/24/21  Yes Autry-Lott, Naaman Plummer, DO  metFORMIN (GLUCOPHAGE) 1000 MG tablet Take 1,000 mg by mouth 2 (two) times daily with a meal.   Yes [provider]  nitroGLYCERIN (NITROSTAT) 0.4 MG SL tablet Place 1 tablet (0.4 mg total) under the tongue every 5 (five) minutes as needed for chest pain. 09/24/21  Yes Autry-Lott, Naaman Plummer, DO  Nutritional Supplements (FEEDING SUPPLEMENT, NEPRO CARB STEADY,) LIQD Take 237 mLs by mouth 2 (two) times daily between meals. 08/29/22  Yes Bonnielee Haff, MD  potassium chloride (KLOR-CON M) 10 MEQ tablet Take 10 mEq by mouth 2 (two) times daily.   Yes [provider]  QUEtiapine (SEROQUEL) 25 MG tablet Take 3 tablets (75 mg total) by mouth at bedtime. Patient taking differently: Take 50 mg by mouth 2 (two)  times daily. 09/24/21  Yes Autry-Lott, Naaman Plummer, DO  saccharomyces boulardii (FLORASTOR)  250 MG capsule Take 1 capsule (250 mg total) by mouth 2 (two) times daily. 08/29/22  Yes Bonnielee Haff, MD  sertraline (ZOLOFT) 50 MG tablet Take 50 mg by mouth daily.   Yes [provider]  tamsulosin (FLOMAX) 0.4 MG CAPS capsule Take 2 capsules (0.8 mg total) by mouth daily. 09/24/21  Yes Autry-Lott, Naaman Plummer, DO  citalopram (CELEXA) 20 MG tablet Take 1 tablet (20 mg total) by mouth daily. Patient not taking: Reported on 10/25/2022 09/24/21   Autry-Lott, Naaman Plummer, DO  insulin glargine (LANTUS) 100 UNIT/ML Solostar Pen Inject 12 Units into the skin daily. Patient not taking: Reported on 10/25/2022 08/29/22   Bonnielee Haff, MD  irbesartan (AVAPRO) 150 MG tablet Take 150 mg by mouth daily.    [provider]  lidocaine (LIDODERM) 5 % Place 1 patch onto the skin daily. Remove & Discard patch within 12 hours or as directed by MD Patient not taking: Reported on 10/25/2022 09/24/21   Autry-Lott, Naaman Plummer, DO  losartan (COZAAR) 50 MG tablet Take 1.5 tablets (75 mg total) by mouth at bedtime. Patient not taking: Reported on 10/25/2022 09/24/21   Autry-Lott, Naaman Plummer, DO  olopatadine (PATANOL) 0.1 % ophthalmic solution Place 1 drop into both eyes 2 (two) times daily as needed (dry eyes). Patient not taking: Reported on 10/25/2022 09/24/21   Autry-Lott, Naaman Plummer, DO  senna (SENOKOT) 8.6 MG TABS tablet Take 1 tablet (8.6 mg total) by mouth daily. Patient not taking: Reported on 10/25/2022 09/24/21   Autry-Lott, Naaman Plummer, DO   No Known Allergies Review of Systems  Unable to perform ROS: Patient unresponsive    Physical Exam Constitutional:      Appearance: He is normal weight. He is ill-appearing.     Interventions: Face mask in place.  Cardiovascular:     Rate and Rhythm: Tachycardia present.  Pulmonary:     Effort: Tachypnea present.  Skin:    General: Skin is warm and dry.     Vital Signs: BP 126/74   Pulse (!) 122    Temp 99.9 F (37.7 C)   Resp 17   Wt 98.3 kg   SpO2 99%   BMI 29.39 kg/m  Pain Scale: PAINAD   Pain Score: Asleep   SpO2: SpO2: 99 % O2 Device:SpO2: 99 % O2 Flow Rate: .O2 Flow Rate (L/min): 15 L/min  IO: Intake/output summary:  Intake/Output Summary (Last 24 hours) at 11/02/2022 0845 Last data filed at 11/01/2022 1140 Gross per 24 hour  Intake --  Output 340 ml  Net -340 ml    LBM: Last BM Date : 11/01/22 Baseline Weight: Weight: 98.3 kg Most recent weight: Weight: 98.3 kg     Palliative Assessment/Data: 20 %    Discussed with Dr Spero Curb   Time In: 0800 Time Out: 0930 Time Total: 90 minutes Greater than 50%  of this time was spent counseling and coordinating care related to the above assessment and plan.  Signed by: Wadie Lessen, NP   Please contact Palliative Medicine Team phone at [phone] for questions and concerns.  For individual provider: See Shea Evans

## 2022-11-02 NOTE — Plan of Care (Signed)

## 2022-11-02 NOTE — Progress Notes (Signed)
Pharmacy Antibiotic Note  Charles Fox is a 72 y.o. male  with fevers and tachycardia, possible sepsis.  Pharmacy has been consulted for Vancomycin and Cefepime dosing.  Plan: Vancomycin 2000 mg IV now, then 1250 mg IV q12h Cefepime 2 g IV q8h  Weight: 98.3 kg (216 lb 11.4 oz)  Temp (24hrs), Avg:99.4 F (37.4 C), Min:98.8 F (37.1 C), Max:100 F (37.8 C)  Recent Labs  Lab 10/27/22 0716 10/27/22 1420 10/30/22 0526 10/31/22 0419 11/01/22 0249 11/01/22 1632 11/02/22 0233  WBC 16.5*  --   --   --   --   --   --   CREATININE 0.73   < > 0.74 0.79 0.73 0.66 0.67   < > = values in this interval not displayed.    Estimated Creatinine Clearance: 105.9 mL/min (by C-G formula based on SCr of 0.67 mg/dL).    No Known Allergies   Caryl Pina 11/02/2022 5:58 AM

## 2022-11-02 NOTE — Progress Notes (Signed)
Subjective:  Overnight patient was febrile to 103 and tachycardic, patient evaluated at bedside (See Dr. Myer Peer note).   Patient lying in bed this morning in no acute distress, but does not respond to voice or painful stimulus.   Objective:  Vital signs in last 24 hours: Vitals:   11/01/22 1931 11/02/22 0013 11/02/22 0245 11/02/22 0317  BP: (!) 173/75 (!) 151/78 (!) 154/85 (!) 164/89  Pulse: 100 (!) 115 (!) 119 (!) 122  Resp: (!) 24 20 (!) 30 (!) 32  Temp: 99.5 F (37.5 C) 99.7 F (37.6 C)  100 F (37.8 C)  TempSrc: Axillary Oral  Axillary  SpO2: 98% (!) 86% 97% 93%  Weight:       Weight change:   Intake/Output Summary (Last 24 hours) at 11/02/2022 [id number] Last data filed at 11/01/2022 1140 Gross per 24 hour  Intake --  Output 485 ml  Net -485 ml   DG CHEST PORT 1 VIEW CLINICAL DATA:  Aspiration pneumonia  EXAM: PORTABLE CHEST 1 VIEW  COMPARISON:  Chest x-ray dated October 24, 2022  FINDINGS: Cardiac and mediastinal contours are unchanged. New right lower lobe and right pericardiac opacity with evidence of right hemithorax volume loss. No large pleural effusion or pneumothorax. Feeding tube partially visualized coursing below the diaphragm  IMPRESSION: New right lower lobe and right pericardiac opacity, likely combination of aspiration pneumonia and atelectasis.  Electronically Signed   By: Yetta Glassman M.D.   On: 11/02/2022 08:23   Physical Exam General: Not alert, does not appear in acute distress.  HEENT: Non-rebreather mask in place.  CV: Tachycardic. No murmurs, rubs, or gallops.  Pulm: No respiratory distress on NRB 15L Abd: Normoactive bowel sounds. Soft, non-tender, non-distended. No rigidity or guarding.  Skin: Warm and dry Ext: No LE edema Neuro: Not alert or oriented. Does not respond or follow commands. Does not withdraw to painful stimulus.  Psych: Unable to assess  Assessment/Plan:  Principal Problem:   Acute metabolic  encephalopathy Active Problems:   Type 2 diabetes mellitus with neurological complications (HCC)   Dementia (HCC)   Bladder outlet obstruction   Controlled diabetes mellitus type 2 with complications (HCC)   Hypernatremia   Moderate protein-calorie malnutrition (HCC)  Acute Metabolic Encephalopathy Hx of Alzheimer's Dementia Hypernatremia Overnight he became febrile with new oxygen requirement, and CXR is consistent with a recurrent aspiration pneumonia on tube feeds. Patient's mental status is worse on exam today compared to prior days. He previously would respond briefly to voice and withdraw from stimuli, but he is unresponsive to either today. 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 In this time period he has not been noted to have made any significant improvement towards his baseline and this is consistent with the lack of improvement in mental status this admission despite treating him for aspiration pneumonia and hypernatremia, and today he has developed likely recurrent aspiration pneumonia on tube feeds. His prognosis is poor with the development of a recurrent infection in the setting of progressive decline and our recommendation has been to transition this patient to comfort care. Palliative Care consulted for a second opinion and agrees with our recommendation, and they will send their evaluation to patient's legal guardian for review. Will continue to treat pneumonia and monitor clinical status,  and will follow up with legal guardian. -Unasyn 3g @200mL$ /hr q6h -Hold tube feeds today for aspiration pneumonia -280m free water q4h per tube -Follow up with legal guardian for comfort care approval  Aspiration  Pneumonia - recurrent Patient previously completed 5 day course of Unasyn this admission. Patient was febrile and tachycardic overnight, CXR consistent with recurrent aspiration pneumonia on tube feeds.  -Unasyn 3g @200mL$ /hr q6h  T2DM    CBG 204 this morning. - Semglee 15 units daily - SSI, sensitive - Hold home metformin    Hx of HTN BP stable, will continue to monitor.  -Hold home irbesartan, carvedilol.    Hx of CVA -Home aspirin per tube -Hold home atorvastatin due to NPO status   DNR/DNI Comfort Care Diet: Holding tube feeds for new aspiration pneumonia VTE: Lovenox 430mIVF: None    LOS: 9 days   NeSpero CurbMedical Student 11/02/2022, 6:38 AM

## 2022-11-02 NOTE — Progress Notes (Addendum)
IMTS messaged by RN about persistent tachycardia, hypertensive and increasing O2 demand, on NRB at 15 L.   On arrival, patient remains somnolent but appeared diaphoretic. Tachycardic to 130. Switched back to nasal cannula, on 6 L, with O2 saturation in low 90s. Temperature of 100.0.  Will give 500 ml LR bolus at 250 ml/hr. Will notify day team about this and they will need to update patient's legal guardian.   Addendum [id number]: Rectal temp 103.1. Will obtain blood cultures and CBC. Started empiric antibiotics with vancomycin and cefepime.

## 2022-11-02 NOTE — Progress Notes (Signed)
Nutrition Brief Note  Chart reviewed. Pt is now transitioning to comfort care. RN to removed Cortrak tube.  No further nutrition interventions planned at this time.  Please re-consult as needed.     Hermina Barters RD, LDN Clinical Dietitian See Shea Evans for contact information.

## 2022-11-02 NOTE — Progress Notes (Signed)
   11/02/22 1800  Spiritual Encounters  Type of Visit Initial  Care provided to: Patient;Friend  Conversation partners present during encounter Nurse  Referral source Nurse (RN/NT/LPN)  Reason for visit End-of-life  OnCall Visit Yes  Interventions  Spiritual Care Interventions Made Compassionate presence;Reflective listening   Ch responded to page to provide spiritual care to Pt. Pt's friend was at bedside. Pt's pastor could not be reached at this time. Pt's friend [name] requested [place] to pray for pt. Pt's girlfriend said that pt is under DSS custody. He does not have any close family connections but pt's girlfriend has been a great source of support for pt. Ch prayed for patient. AM CH will follow up tomorrow.

## 2022-11-02 NOTE — Progress Notes (Signed)
Subjective:  Patient was xxx X  unresponsive this morning, moved his right arm slightly to painful tactile stimulus.    Objective: Vitals over previous 24hr: Vitals:   11/02/22 0600 11/02/22 0737 11/02/22 0744 11/02/22 1215  BP:  126/74    Pulse:  (!) 122    Resp:  17    Temp: (!) 103.1 F (39.5 C)  99.9 F (37.7 C) 99.2 F (37.3 C)  TempSrc: Rectal     SpO2:  99%    Weight:        General:      obtunded, lying in bed Skin:       warm and dry, intact without any obvious lesions or scars, no rashes Lungs:      normal respiratory effort, breathing unlabored, symmetrical chest rise, no crackles or wheezing Cardiac:      sinus tachycardia, normal S1 and S2, no pitting edema Abdomen:      soft and non-distended, normoactive bowel sounds present in all four quadrants Neurologic:      oriented to zero    Assessment/Plan: Mr Sthilaire is a 72 year old male with a past medical history of diabetes, CVA, advanced dementia, and seizure who presented with altered mental status, now admitted for acute metabolic encephalopathy in setting of progressive dementia.    ---Acute metabolic encephalopathy ---Hypovolemic hypernatremia ---Dementia Patient sent from a SNF to [facility] ED for declining functional status on 2-5. Baseline cognition had decreased after recent hospitalization for sepsis that required intubation and never improved, which is consistent with progressive dementia. Upon arrival, laboratory testing notable for severe hypernatremia secondary to poor oral intake and chronic dehydration. Intravenous fluids were started and have been replaced with free water boluses via nasogastric tube. Throughout current hospitalization, the patient has remained minimally responsive only to painful tactile stimuli and cannot follow commands. Primary team has contacted legal guardian and has transitioned patient to comfort measures. > Glucerna feeding supplement via nasogastric tube > Free water  24m q4 via nasogastric tube > Trend BMP q24 > Contact legal guardian for goals of care discussion   ---Diabetes II Patient has history of diabetes managed at home with metformin and glargine 10U. Metformin has been held throughout current hospitalization. Glargine increased from 10U to 15U on 2-12 for elevated glucose levels. > Insulin glargine 15U q24 > Insulin sliding scale, sensitive sensitivity > Trend CBG q4   ---Aspiration pneumonia, now resolved Patient presented with aspiration pneumonia identified with left lower lobe infiltrate identified via chest radiograph. He has completed a five-day course of ampicillin-sulbactam. Breathing on exam is unlabored and SpO2 values have been 96-100%. > Monitor clinically   ---Hypertension Patient has history of hypertension managed at home with losartan and irbesartan. Blood pressure has been stable throughout current hospitalization. > Hold home losartan and irbesartan   ---Cerebrovascular accident Patient has a history of lacunar cerebrovascular vascular accident sustained in 05-2020. He takes aspirin and atorvastatin at home. > Aspirin [id number] > Home atorvastatin given nil per os status    Principal Problem:   Acute metabolic encephalopathy Active Problems:   Type 2 diabetes mellitus with neurological complications (HCC)   Dementia (HCC)   Bladder outlet obstruction   Cont[place] diabetes mellitus [place] 2 with complications [place]  Hypernatremia   Moderate protein-calorie malnutrition ([place]   Prior to Admission [facility] Barriers to Discharge: clinical improvement Dispo: Anticipated discharge in approximately 4-5 day(s).    Da[place]well NickelMD Internal  Medicine PGY-1 Pager [phone]  After 5pm on weekdays and 1pm on weekends: On Call pager [phone]

## 2022-11-02 NOTE — Progress Notes (Signed)
Patient family friend, [name], asked about patient's right ear and that it was red and had drainage.  I explained to her that patient was comfort care and that we were only treating symptoms such as pain and shortness of breath.  Patient's ear does not appear to be bothering patient.  I notified Dr. Dareen Piano with Karen's concerns.  Dr. Dareen Piano advised we are only treating symptoms such as pain and shortness of breath.  I advised Dr. Dareen Piano that patient's ear did not seem to be bothering him.  Dr. Dareen Piano said we could have the night on call resident round on patient if we felt needed.  I did forward this information to the oncoming nurse, Sam, RN.

## 2022-11-02 NOTE — IPAL (Signed)
Palliative care was able to get into contact with legal guardian and discussed grim prognosis. Also discussed recommendation to transition to comfort-focused care approach given his terminal illness. Legal guardian responded giving permission to transition to comfort care. Will reach out to palliative care to place comfort orders.  -transition to comfort care

## 2022-11-03 DIAGNOSIS — F0390 Unspecified dementia without behavioral disturbance: Secondary | ICD-10-CM

## 2022-11-03 DIAGNOSIS — J690 Pneumonitis due to inhalation of food and vomit: Secondary | ICD-10-CM | POA: Diagnosis not present

## 2022-11-03 DIAGNOSIS — G9341 Metabolic encephalopathy: Secondary | ICD-10-CM | POA: Diagnosis not present

## 2022-11-03 DIAGNOSIS — Z515 Encounter for palliative care: Secondary | ICD-10-CM | POA: Diagnosis not present

## 2022-11-03 DIAGNOSIS — R0609 Other forms of dyspnea: Secondary | ICD-10-CM

## 2022-11-03 MED ORDER — MORPHINE SULFATE (PF) 2 MG/ML IV SOLN
2.0000 mg | Freq: Four times a day (QID) | INTRAVENOUS | Status: DC
  Administered 2022-11-03 – 2022-11-04 (×6): 2 mg via INTRAVENOUS
  Filled 2022-11-03 (×6): qty 1

## 2022-11-03 MED ORDER — CIPROFLOXACIN-FLUOCINOLONE PF 0.3-0.025 % OT SOLN
0.2500 mL | Freq: Two times a day (BID) | OTIC | Status: DC
  Administered 2022-11-03 – 2022-11-04 (×3): 0.25 mL via OTIC
  Filled 2022-11-03: qty 3.5

## 2022-11-03 NOTE — Progress Notes (Addendum)
Subjective:  Patient unresponsive this morning. Breathing labored and increased discharge from his right ear compared to yesterday.    Objective: Vitals over previous 24hr: Vitals:   11/02/22 0744 11/02/22 1215 11/02/22 2158 11/03/22 0735  BP:   118/64 (!) 141/73  Pulse:    (!) 115  Resp:    (!) 28  Temp: 99.9 F (37.7 C) 99.2 F (37.3 C) 99.3 F (37.4 C) 100.2 F (37.9 C)  TempSrc:   Axillary Axillary  SpO2:    (!) 88%  Weight:        General:      obtunded, lying in bed Skin:       warm and dry, right ear with thin discharge and extensive skin breakdown Lungs:      normal respiratory effort, breathing unlabored, symmetrical chest rise, no crackles or wheezing Cardiac:      sinus tachycardia, normal S1 and S2, no pitting edema Abdomen:      soft and non-distended, normoactive bowel sounds present in all four quadrants Neurologic:      oriented to zero     Assessment/Plan: Charles Fox is a 72 year old male with a past medical history of diabetes, CVA, advanced dementia, and seizure who presented with altered mental status, now admitted for acute metabolic encephalopathy in setting of progressive dementia.    ---Acute metabolic encephalopathy ---End-stage dementia Patient sent from a SNF to [facility] ED for declining functional status on 2-5. Baseline cognition had decreased after recent hospitalization for sepsis that required intubation and never improved, which is consistent with progressive dementia. Upon arrival, laboratory testing notable for severe hypernatremia secondary to poor oral intake and chronic dehydration. Intravenous fluids were started and have been replaced with free water boluses via nasogastric tube. Throughout current hospitalization, the patient has remained minimally responsive only to painful tactile stimuli and cannot follow commands. Primary team has contacted legal guardian and has transitioned patient to comfort measures. Nasogastric tube for  nutrition and water was removed on 2-14. > Lorazepam 63m q4 PRN > Morphine 267mq6 + 73m13m4 PRN    ---Otitis externa Physical examination on 2-15 revealed dark red discharge from patient's right ear. Upon gentle manipulation of that ear, the patient appeared agitated. These findings are concerning for possible otitis externa. The patient would likely benefit from treatment to relieve symptoms. Accordingly, ciprofloxacin was started on 2-15 to optimize patient comfort. > Ciprofloxacin-fluocinolone 0.66m13m2 > Adjust bedding as needed to relieve pressure on right ear   ---Diabetes II Patient has history of diabetes managed at home with metformin and glargine 10U. Metformin has been held throughout current hospitalization. Glargine increased from 10U to 15U on 2-12 for elevated glucose levels. > Comfort care, hold home medications   ---Aspiration pneumonia, now resolved Patient presented with aspiration pneumonia identified with left lower lobe infiltrate identified via chest radiograph. He has completed a five-day course of ampicillin-sulbactam. > Monitor clinically   ---Hypertension Patient has history of hypertension managed at home with losartan and irbesartan. Blood pressure has been stable throughout current hospitalization. > Comfort care, hold home medications   ---Cerebrovascular accident Patient has a history of lacunar cerebrovascular vascular accident sustained in 05-2020. He takes aspirin and atorvastatin at home. > Comfort care, hold home medications    Principal Problem:   Acute metabolic encephalopathy Active Problems:   Type 2 diabetes mellitus with neurological complications (HCC)   Dementia (HCC)   Bladder outlet obstruction   Controlled diabetes mellitus type 2 with complications (HCC)[place]  Hypernatremia   Moderate protein-calorie malnutrition ([place])    Prior to Admission Living Arrangement: [facility] Anticipated Discharge Location:  Pending Barriers to Discharge: clinical improvement Dispo: Anticipated discharge in approximately 2-3 day(s).    Charles Nickel, MD Internal Medicine PGY-1 Pager [phone]  After 5pm on weekdays and 1pm on weekends: On Call pager [phone]

## 2022-11-03 NOTE — Plan of Care (Signed)

## 2022-11-03 NOTE — Progress Notes (Signed)
   11/03/22 1830  Spiritual Encounters  Type of Visit Initial;Follow up  Care provided to: Family (Long time friend, [name] at the bedside)  Referral source Nurse (RN/NT/LPN)  Reason for visit End-of-life  OnCall Visit Yes  Interventions  Spiritual Care Interventions Made Compassionate presence;Reflective listening;Bereavement/grief support;Prayer;Supported grief process   This note was prepared by Jeanine Luz, M.Div..  For questions please contact by phone [phone].

## 2022-11-03 NOTE — Progress Notes (Signed)
Patient ID: Charles Fox, male   DOB: 01/17/1951, 72 y.o.   MRN: [id number]    Progress Note from the Palliative Medicine Team at [facility]   Patient Name: Charles Fox        Date: 11/03/2022 DOB: 1950/12/16  Age: 72 y.o. MRN#: [id number] Attending Physician: Aldine Contes, MD Primary Care Physician: Pcp, No Admit Date: 10/26/2022   Medical records reviewed, discussed with bedside RN   72 y.o. male  admitted on 11/14/2022 with  person living with  dementia in a skilled nursing facility sent to the emergency department with declining functional status and admitted treatment and stabilization  of severe hypernatremia.    PMH significant for dementia, diabetes, bladder outlet obstruction.   Continued overall failure to thrive, unfortunately Charles Fox had no significant improvement within the context of full medical support.   Patient without medical capacity at this time, legal decision maker/guardian/ Jesusita Oka.  Decision made yesterday at recommendation from treatment team  for comfort focused care, allowing for a natural death .     This NP assessed patient at the bedside as a follow up for palliative medicine needs and emotional support.   Plan of Care: -DNR/DNI - no further diagnostics -no artificial feeding or hydration -symptom management    - Morphine 2 mg every 6 hrs scheduled      -Morphine 1 mgIV every one hr as needed     -Ativan 1 mg IV every 4 hrs as needed Focus of care for Charles Fox is comfort, quality and dignity.  Prognosis is likely hours to days   Education offered today to bedside RN regarding end-of-life care specific to the utilization of oxygen and medications to treat symptoms specific to dyspnea/ air hunger, pain and anxiety.   Total time spent on the unit was 50 minutes-greater than 50% of the time was spent in counseling and coordination of care  Wadie Lessen NP  Palliative Medicine Team Team Phone # [phone] Pager  [phone]

## 2022-11-03 NOTE — Progress Notes (Signed)
   11/03/22 1654  Spiritual Encounters  Type of Visit Initial  Care provided to: Patient;Friend [name])  Referral source Nurse (RN/NT/LPN)  Reason for visit End-of-life  OnCall Visit No  Interventions  Spiritual Care Interventions Made Prayer;Compassionate presence   Pt being transitioned to comfort care. No family to support but had a friends present [name]).  Chp offered prayer upon request. Passed on to on-call chaplain to visit tonight.

## 2022-11-04 DIAGNOSIS — R0609 Other forms of dyspnea: Secondary | ICD-10-CM | POA: Diagnosis not present

## 2022-11-04 DIAGNOSIS — G9341 Metabolic encephalopathy: Secondary | ICD-10-CM | POA: Diagnosis not present

## 2022-11-04 DIAGNOSIS — H6091 Unspecified otitis externa, right ear: Secondary | ICD-10-CM | POA: Diagnosis not present

## 2022-11-04 DIAGNOSIS — F0390 Unspecified dementia without behavioral disturbance: Secondary | ICD-10-CM | POA: Diagnosis not present

## 2022-11-04 DIAGNOSIS — Z515 Encounter for palliative care: Secondary | ICD-10-CM | POA: Diagnosis not present

## 2022-11-04 DIAGNOSIS — G309 Alzheimer's disease, unspecified: Secondary | ICD-10-CM | POA: Diagnosis not present

## 2022-11-04 DIAGNOSIS — J690 Pneumonitis due to inhalation of food and vomit: Secondary | ICD-10-CM | POA: Diagnosis not present

## 2022-11-04 NOTE — Progress Notes (Signed)
Patient ID: KWEKU WOLFENBARGER, male   DOB: [birth year], 72 y.o.   MRN: [id number]    Progress Note from the Palliative Medicine Team at [facility]   Patient Name: Charles Fox        Date: 10/30/2022 DOB: January 11, 1951  Age: 72 y.o. MRN#: [id number] Attending Physician: Aldine Contes, MD Primary Care Physician: Pcp, No Admit Date: 11/13/2022   Medical records reviewed, discussed with bedside RN   72 y.o. male  admitted on 11/10/2022 with  person living with  dementia in a skilled nursing facility sent to the emergency department with declining functional status and admitted treatment and stabilization  of severe hypernatremia.    PMH significant for dementia, diabetes, bladder outlet obstruction.   Continued overall failure to thrive, unfortunately [name] had no significant improvement within the context of full medical support.   Patient without medical capacity at this time, legal decision maker/guardian/ Jesusita Oka.  Decision made yesterday at recommendation from treatment team  for comfort focused care, allowing for a natural death .     This NP assessed patient at the bedside as a follow up for palliative medicine needs and emotional support.  Patient is unresponsive, appears comfortable, knees are mottled.    Plan of Care: -DNR/DNI - no further diagnostics -no artificial feeding or hydration -symptom management    - Morphine 2 mg every 6 hrs scheduled      -Morphine 1 mgIV every one hr as needed     -Ativan 1 mg IV every 4 hrs as needed Focus of care for [name] is comfort, quality and dignity.  Prognosis is likely hours to days    Expect hospital death   Total time spent on the unit was 25 minutes-greater than 50% of the time was spent in counseling and coordination of care  Wadie Lessen NP  Palliative Medicine Team Team Phone # [phone] Pager [phone]

## 2022-11-04 NOTE — Plan of Care (Signed)

## 2022-11-04 NOTE — Plan of Care (Signed)
Pt is respond to pain. Pt tolerated PIV medications. Pt foley is intact, patent and draining, noted.   Problem: Education: Goal: Knowledge of General Education information will improve Description: Including pain rating scale, medication(s)/side effects and non-pharmacologic comfort measures Outcome: Not Progressing   Problem: Health Behavior/Discharge Planning: Goal: Ability to manage health-related needs will improve Outcome: Not Progressing   Problem: Clinical Measurements: Goal: Ability to maintain clinical measurements within normal limits will improve Outcome: Not Progressing Goal: Will remain free from infection Outcome: Not Progressing Goal: Diagnostic test results will improve Outcome: Not Progressing Goal: Respiratory complications will improve Outcome: Not Progressing Goal: Cardiovascular complication will be avoided Outcome: Not Progressing

## 2022-11-04 NOTE — Progress Notes (Signed)
   10/31/2022 1826  Spiritual Encounters  Type of Visit Initial  Care provided to: Patient;Family  Conversation partners present during encounter Nurse  Referral source Family  Reason for visit End-of-life  OnCall Visit Yes  Spiritual Framework  Presenting Themes Meaning/purpose/sources of inspiration;Rituals and practive  Community/Connection Significant other  Family Stress Factors Exhausted;Major life changes  Interventions  Spiritual Care Interventions Made Compassionate presence;Reflective listening;Narrative/life review;Prayer;Encouragement;Supported grief process  Intervention Outcomes  Outcomes Reduced anxiety;Reduced isolation  Spiritual Care Plan  Spiritual Care Issues Still Outstanding Chaplain will continue to follow;Referring to oncoming chaplain for further support  Follow up plan  Comfort during transition   Received call from nurse, family requesting visit. Visited patient's room. Patient unresponsive. Provided spiritual care to significant other, [name].

## 2022-11-07 LAB — CULTURE, BLOOD (ROUTINE X 2)
Culture: NO GROWTH
Culture: NO GROWTH
Special Requests: ADEQUATE
Special Requests: ADEQUATE

## 2022-11-18 NOTE — Death Summary Note (Signed)
  Name: TARRON KUEHNER MRN: [id number] DOB: January 01, 1951 73 y.o.  Date of Admission: 10/27/2022  3:24 PM Date of Discharge: 11/19/2022 Attending Physician: Aldine Contes, MD  Discharge Diagnosis: Principal Problem:   Acute metabolic encephalopathy Active Problems:   Type 2 diabetes mellitus with neurological complications ([place])   Dementia (HCC)   Bladder outlet obstruction   Controlled diabetes mellitus type 2 with complications ([place])   Hypernatremia   Moderate protein-calorie malnutrition ([place])   Cause of death: Acute metabolic encephalopathy in the setting of end-stage dementia Time of death: Dec 12, 2053  Disposition and follow-up:   Mr.Domonique C Richardson was discharged from [facility] in expired condition.    Hospital Course: GARN CONNERLY is a 72 yo male with severe, terminal dementia who initially presented from a skilled nursing facility with declining functional status and altered mental status in the setting of hypernatremia and aspiration pneumonia. The patient was treated with IV antibiotics for 5 days and was also given IV fluids, with an improvement in his sodium status. Despite these measures, the patient clinically did not improve and remained altered. He was not interactive and did not follow any instructions. It was not safe for the patient to take any oral food or liquid, thus a CorTrak was placed for nutrition, however, he did not improve despite full medical support. Many discussions were held with the patient's legal guardian, and the patient was ultimately changed to DNR. We recommended a palliative care consult, and at that point, transitioned the patient to comfort care in his end of life, to allow for a natural death. The patient was treated with PRN morphine and ativan and was kept comfortable. He died on November 19, 2022 at Dec 12, 2051.  SignedDorethea Clan, DO 11-19-2022, 7:37 PM

## 2022-11-18 DEATH — deceased

## 2023-02-09 ENCOUNTER — Inpatient Hospital Stay: Payer: Medicare Other | Admitting: Neurology

## 2023-04-24 IMAGING — CT CT HEAD W/O CM
4 of 5 series · 15 of 47 positions shown, 17 images · non-contrast
Comparison: 05/07/2021

CLINICAL DATA: Head trauma

EXAM:
CT HEAD WITHOUT CONTRAST
TECHNIQUE: Contiguous axial images were obtained from the base of the skull
through the vertex without intravenous contrast.

[Series 2: head without · axial · non-contrast · 0.47mm/px · z∈[+1081,+1181]mm · 5 of 31 slices shown, 7 images]
[im 6/31  brain]
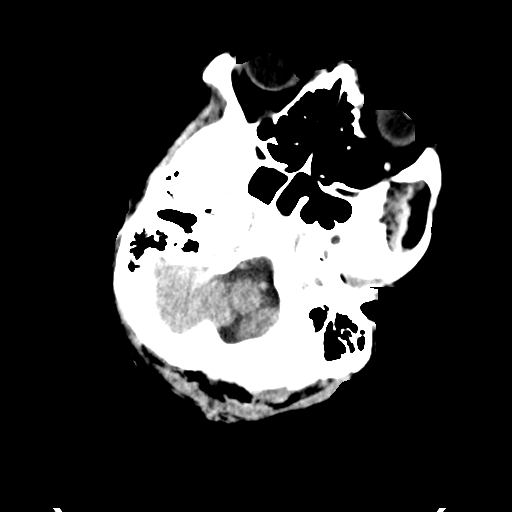
[im 6/31  bone]
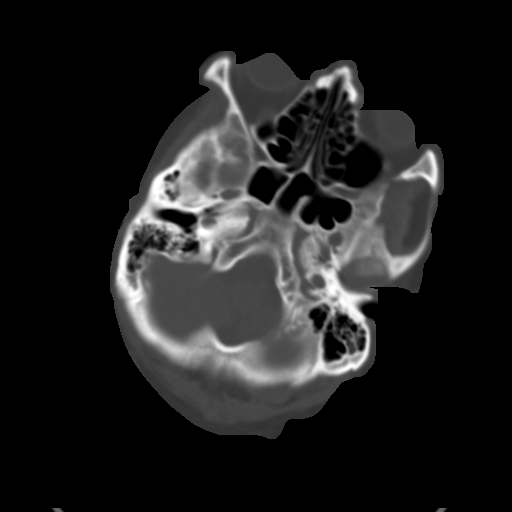
[im 11/31  brain]
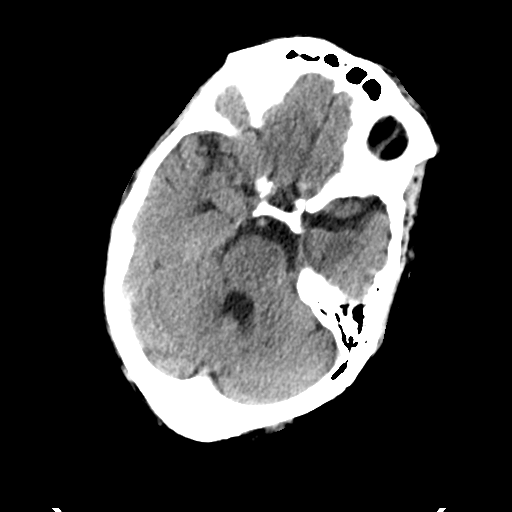
[im 16/31  brain]
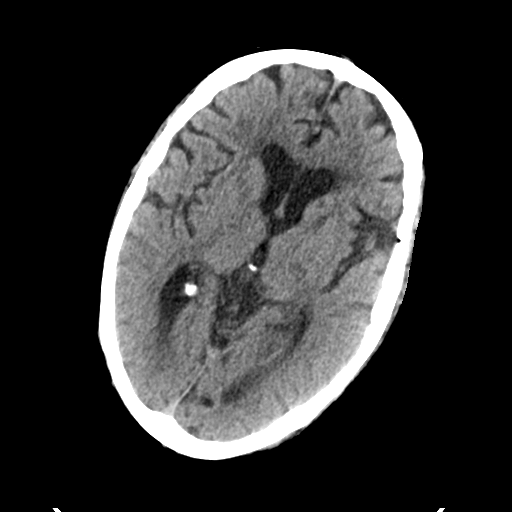
[im 21/31  brain]
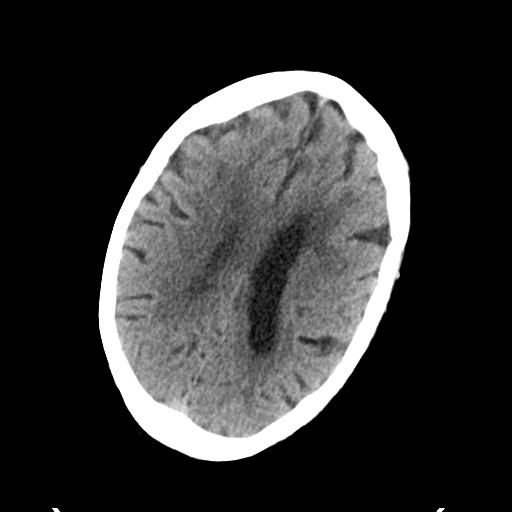
[im 26/31  brain]
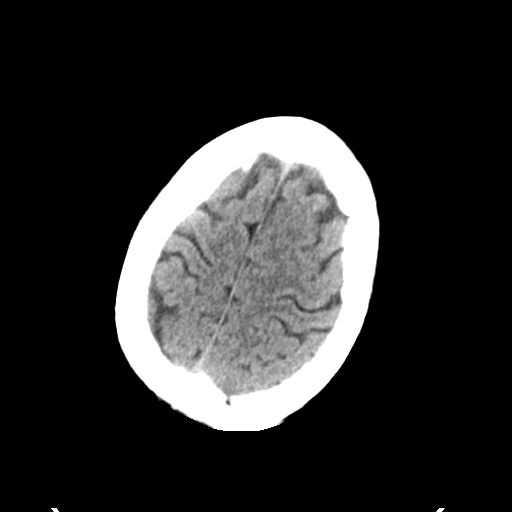
[im 26/31  bone]
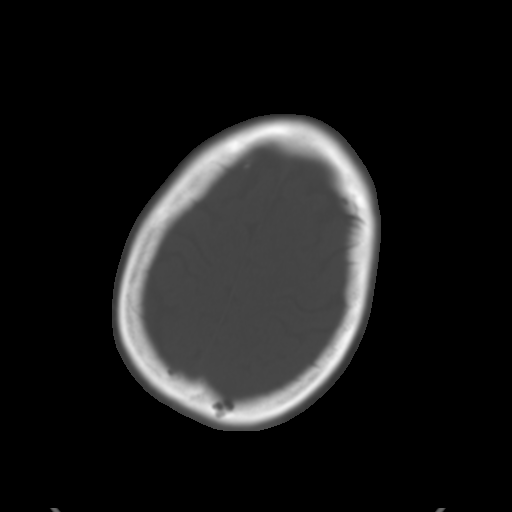

[Series 4: head without cor · coronal · non-contrast · 0.33mm/px · 3 of 72 slices shown]
[im 24/72  brain]
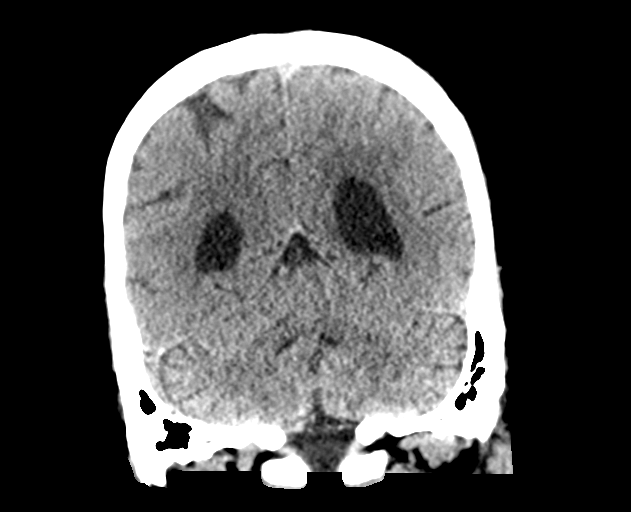
[im 32/72  brain]
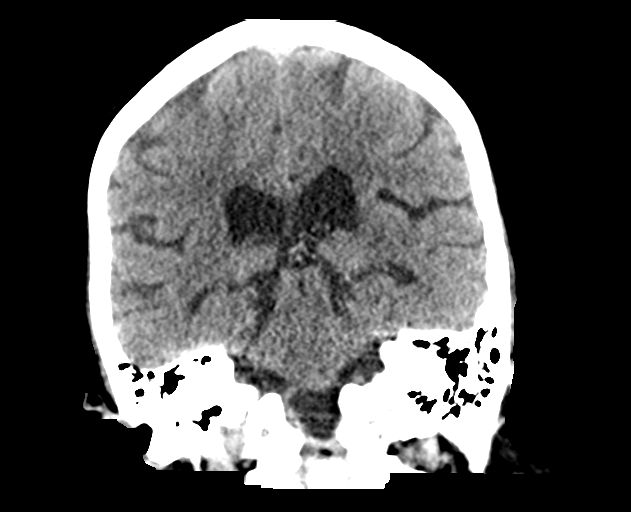
[im 40/72  brain]
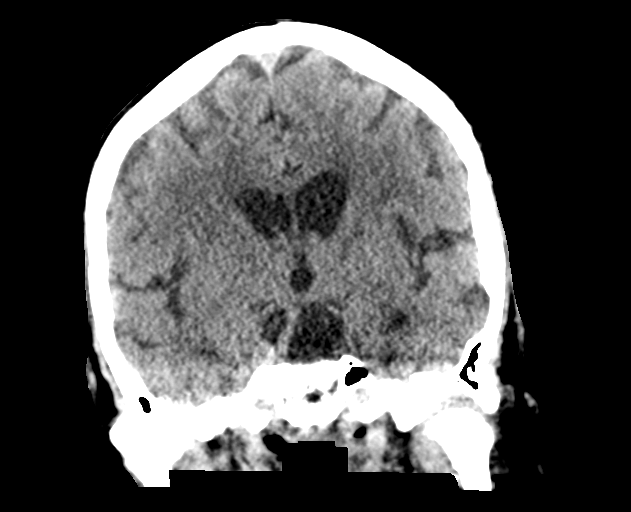

[Series 5: head without sag · sagittal · non-contrast · 0.32mm/px · 3 of 61 slices shown]
[im 21/61  brain]
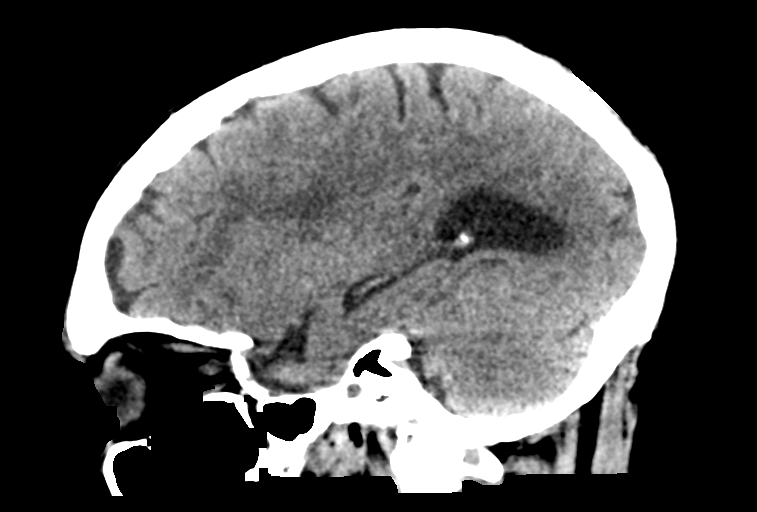
[im 31/61  brain]
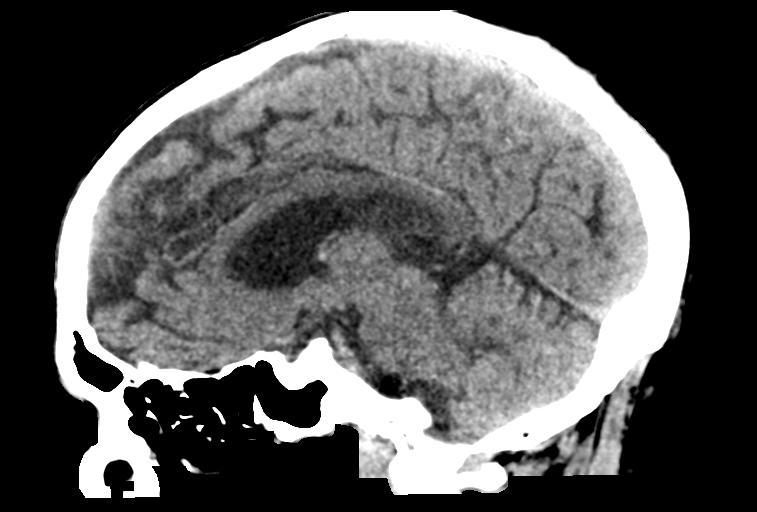
[im 41/61  brain]
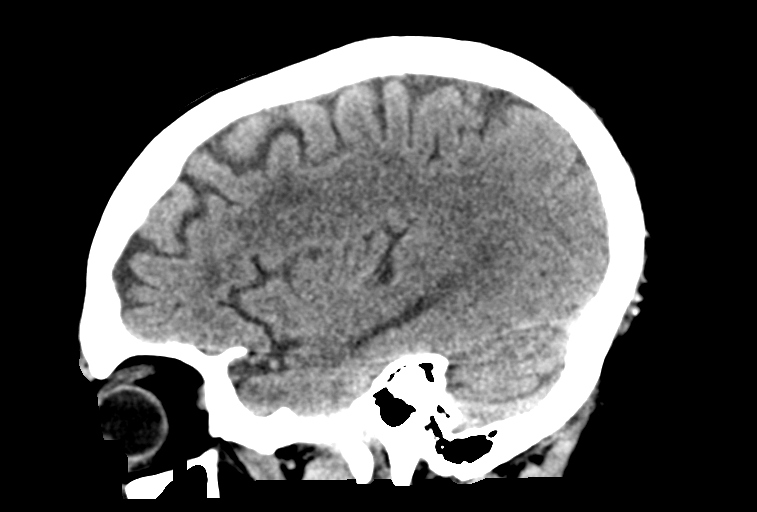

[Series 6: head without ax · axial · non-contrast · 0.37mm/px · z∈[+1077,+1157]mm · 4 of 33 slices shown]
[im 6/33  brain]
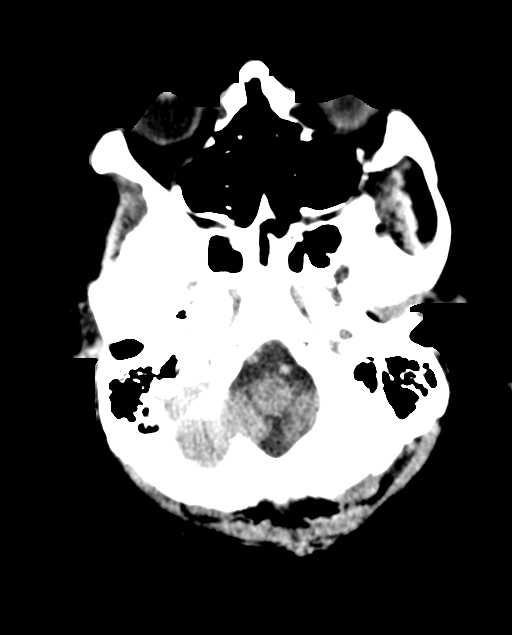
[im 11/33  brain]
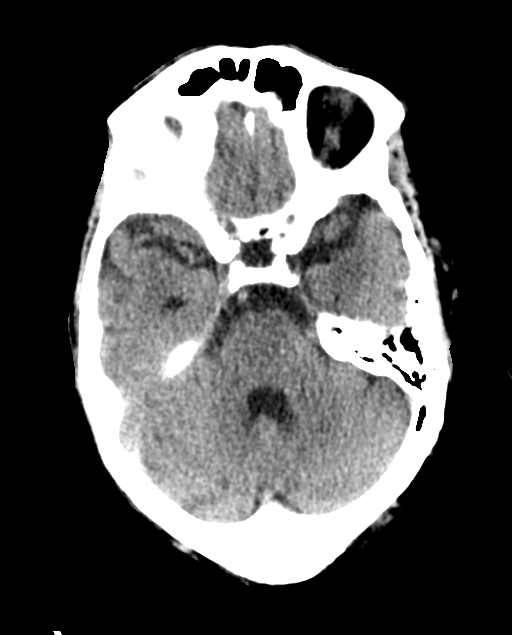
[im 17/33  brain]
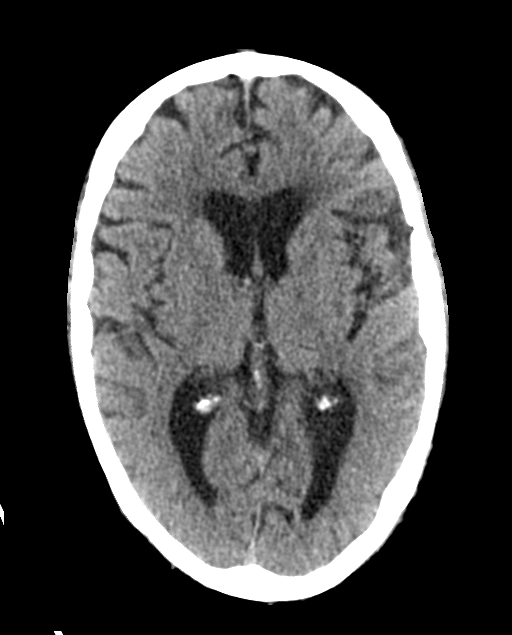
[im 22/33  brain]
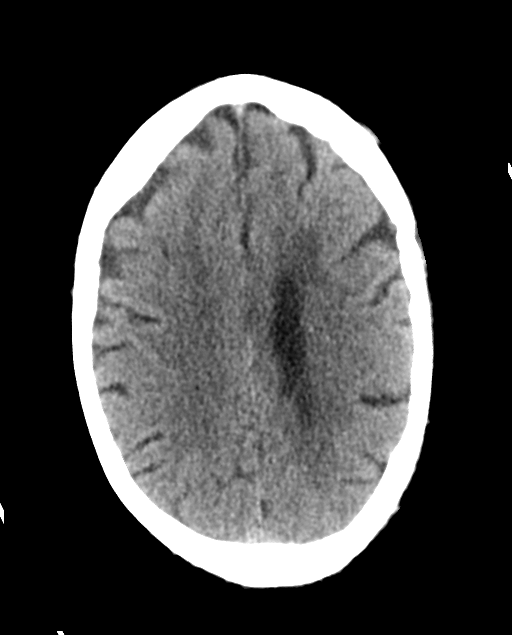

[15 of 47 positions shown; findings below may reference images not displayed]

FINDINGS: Brain: No evidence of acute infarction, hemorrhage, hydrocephalus,
extra-axial collection or mass lesion/mass effect. Periventricular
white matter changes, likely the sequela of chronic small vessel
ischemic disease.

Vascular: No hyperdense vessel or unexpected calcification.

Skull: Normal. Negative for fracture or focal lesion.

Sinuses/Orbits: No acute finding.

Other: The mastoids are well aerated.
IMPRESSION: No acute intracranial process.
